# Patient Record
Sex: Female | Born: 1945 | Race: White | Hispanic: No | Marital: Married | Smoking: Former smoker
Health system: Southern US, Community
[De-identification: ages and names within clinical notes are randomized; demographics above are authoritative.]

## PROBLEM LIST (undated history)

## (undated) DIAGNOSIS — N390 Urinary tract infection, site not specified: Secondary | ICD-10-CM

## (undated) DIAGNOSIS — J90 Pleural effusion, not elsewhere classified: Secondary | ICD-10-CM

## (undated) DIAGNOSIS — M109 Gout, unspecified: Secondary | ICD-10-CM

## (undated) DIAGNOSIS — E7800 Pure hypercholesterolemia, unspecified: Secondary | ICD-10-CM

## (undated) DIAGNOSIS — N3010 Interstitial cystitis (chronic) without hematuria: Secondary | ICD-10-CM

## (undated) DIAGNOSIS — I2510 Atherosclerotic heart disease of native coronary artery without angina pectoris: Secondary | ICD-10-CM

## (undated) DIAGNOSIS — M1990 Unspecified osteoarthritis, unspecified site: Secondary | ICD-10-CM

## (undated) DIAGNOSIS — F32A Depression, unspecified: Secondary | ICD-10-CM

## (undated) DIAGNOSIS — R000 Tachycardia, unspecified: Secondary | ICD-10-CM

## (undated) DIAGNOSIS — E785 Hyperlipidemia, unspecified: Secondary | ICD-10-CM

## (undated) DIAGNOSIS — C50919 Malignant neoplasm of unspecified site of unspecified female breast: Secondary | ICD-10-CM

## (undated) DIAGNOSIS — Z9889 Other specified postprocedural states: Secondary | ICD-10-CM

## (undated) DIAGNOSIS — F329 Major depressive disorder, single episode, unspecified: Secondary | ICD-10-CM

## (undated) DIAGNOSIS — Z91030 Bee allergy status: Secondary | ICD-10-CM

## (undated) DIAGNOSIS — G709 Myoneural disorder, unspecified: Secondary | ICD-10-CM

## (undated) DIAGNOSIS — C569 Malignant neoplasm of unspecified ovary: Secondary | ICD-10-CM

## (undated) DIAGNOSIS — Z9289 Personal history of other medical treatment: Secondary | ICD-10-CM

## (undated) DIAGNOSIS — K219 Gastro-esophageal reflux disease without esophagitis: Secondary | ICD-10-CM

## (undated) HISTORY — DX: Personal history of other medical treatment: Z92.89

## (undated) HISTORY — DX: Hyperlipidemia, unspecified: E78.5

## (undated) HISTORY — DX: Bee allergy status: Z91.030

## (undated) HISTORY — DX: Malignant neoplasm of unspecified ovary: C56.9

## (undated) HISTORY — DX: Malignant neoplasm of unspecified site of unspecified female breast: C50.919

## (undated) HISTORY — DX: Urinary tract infection, site not specified: N39.0

## (undated) HISTORY — DX: Tachycardia, unspecified: R00.0

## (undated) HISTORY — PX: CARDIAC CATHETERIZATION: SHX172

## (undated) HISTORY — DX: Atherosclerotic heart disease of native coronary artery without angina pectoris: I25.10

## (undated) HISTORY — DX: Interstitial cystitis (chronic) without hematuria: N30.10

---
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
## 1974-02-15 HISTORY — PX: APPENDECTOMY: SHX54

## 1974-02-15 HISTORY — PX: ABDOMINAL HYSTERECTOMY: SHX81

## 1998-04-07 ENCOUNTER — Other Ambulatory Visit: Admission: RE | Admit: 1998-04-07 | Discharge: 1998-04-07 | Payer: Self-pay | Admitting: *Deleted

## 1999-02-11 ENCOUNTER — Encounter: Payer: Self-pay | Admitting: Urology

## 1999-02-11 ENCOUNTER — Encounter: Admission: RE | Admit: 1999-02-11 | Discharge: 1999-02-11 | Payer: Self-pay | Admitting: Urology

## 1999-04-10 ENCOUNTER — Other Ambulatory Visit: Admission: RE | Admit: 1999-04-10 | Discharge: 1999-04-10 | Payer: Self-pay | Admitting: *Deleted

## 1999-12-10 ENCOUNTER — Ambulatory Visit (HOSPITAL_COMMUNITY): Admission: RE | Admit: 1999-12-10 | Discharge: 1999-12-10 | Payer: Self-pay | Admitting: Gastroenterology

## 2001-05-17 ENCOUNTER — Encounter: Payer: Self-pay | Admitting: Urology

## 2001-05-17 ENCOUNTER — Encounter: Admission: RE | Admit: 2001-05-17 | Discharge: 2001-05-17 | Payer: Self-pay | Admitting: Urology

## 2005-08-19 ENCOUNTER — Emergency Department (HOSPITAL_COMMUNITY): Admission: EM | Admit: 2005-08-19 | Discharge: 2005-08-19 | Payer: Self-pay | Admitting: Emergency Medicine

## 2009-04-15 ENCOUNTER — Ambulatory Visit: Payer: Self-pay | Admitting: Oncology

## 2009-04-15 HISTORY — PX: BREAST LUMPECTOMY: SHX2

## 2009-04-29 ENCOUNTER — Encounter: Admission: RE | Admit: 2009-04-29 | Discharge: 2009-04-29 | Payer: Self-pay | Admitting: General Surgery

## 2009-05-01 ENCOUNTER — Ambulatory Visit (HOSPITAL_BASED_OUTPATIENT_CLINIC_OR_DEPARTMENT_OTHER): Admission: RE | Admit: 2009-05-01 | Discharge: 2009-05-01 | Payer: Self-pay | Admitting: General Surgery

## 2009-05-15 ENCOUNTER — Ambulatory Visit: Payer: Self-pay | Admitting: Oncology

## 2009-05-16 ENCOUNTER — Ambulatory Visit: Admission: RE | Admit: 2009-05-16 | Discharge: 2009-07-02 | Payer: Self-pay | Admitting: Radiation Oncology

## 2009-08-07 ENCOUNTER — Ambulatory Visit: Payer: Self-pay | Admitting: Oncology

## 2009-08-11 LAB — COMPREHENSIVE METABOLIC PANEL
ALT: 18 U/L (ref 0–35)
Alkaline Phosphatase: 73 U/L (ref 39–117)
CO2: 22 mEq/L (ref 19–32)
Creatinine, Ser: 0.73 mg/dL (ref 0.40–1.20)
Sodium: 141 mEq/L (ref 135–145)
Total Bilirubin: 0.5 mg/dL (ref 0.3–1.2)

## 2009-08-11 LAB — CBC WITH DIFFERENTIAL/PLATELET
BASO%: 0.3 % (ref 0.0–2.0)
Basophils Absolute: 0 10*3/uL (ref 0.0–0.1)
EOS%: 1.1 % (ref 0.0–7.0)
Eosinophils Absolute: 0.1 10*3/uL (ref 0.0–0.5)
HCT: 40.1 % (ref 34.8–46.6)
HGB: 13.5 g/dL (ref 11.6–15.9)
LYMPH%: 19.2 % (ref 14.0–49.7)
MCH: 31.3 pg (ref 25.1–34.0)
MCHC: 33.7 g/dL (ref 31.5–36.0)
MCV: 93 fL (ref 79.5–101.0)
MONO#: 0.5 10*3/uL (ref 0.1–0.9)
MONO%: 9.9 % (ref 0.0–14.0)
NEUT#: 3.6 10*3/uL (ref 1.5–6.5)
NEUT%: 69.5 % (ref 38.4–76.8)
Platelets: 270 10*3/uL (ref 145–400)
RBC: 4.31 10*6/uL (ref 3.70–5.45)
RDW: 13.6 % (ref 11.2–14.5)
WBC: 5.2 10*3/uL (ref 3.9–10.3)
lymph#: 1 10*3/uL (ref 0.9–3.3)

## 2009-10-03 ENCOUNTER — Ambulatory Visit: Payer: Self-pay | Admitting: Oncology

## 2009-10-06 LAB — CBC WITH DIFFERENTIAL/PLATELET
BASO%: 0.1 % (ref 0.0–2.0)
Basophils Absolute: 0 10*3/uL (ref 0.0–0.1)
EOS%: 2.2 % (ref 0.0–7.0)
Eosinophils Absolute: 0.2 10*3/uL (ref 0.0–0.5)
HCT: 42.7 % (ref 34.8–46.6)
HGB: 14.4 g/dL (ref 11.6–15.9)
LYMPH%: 12.8 % — ABNORMAL LOW (ref 14.0–49.7)
MCH: 31.6 pg (ref 25.1–34.0)
MCHC: 33.8 g/dL (ref 31.5–36.0)
MCV: 93.4 fL (ref 79.5–101.0)
MONO#: 0.6 10*3/uL (ref 0.1–0.9)
MONO%: 9.3 % (ref 0.0–14.0)
NEUT#: 5.3 10*3/uL (ref 1.5–6.5)
NEUT%: 75.6 % (ref 38.4–76.8)
Platelets: 277 10*3/uL (ref 145–400)
RBC: 4.57 10*6/uL (ref 3.70–5.45)
RDW: 13.4 % (ref 11.2–14.5)
WBC: 7 10*3/uL (ref 3.9–10.3)
lymph#: 0.9 10*3/uL (ref 0.9–3.3)

## 2009-10-06 LAB — COMPREHENSIVE METABOLIC PANEL
ALT: 22 U/L (ref 0–35)
AST: 22 U/L (ref 0–37)
Albumin: 4.4 g/dL (ref 3.5–5.2)
Alkaline Phosphatase: 96 U/L (ref 39–117)
BUN: 15 mg/dL (ref 6–23)
CO2: 26 mEq/L (ref 19–32)
Calcium: 9.5 mg/dL (ref 8.4–10.5)
Chloride: 101 mEq/L (ref 96–112)
Creatinine, Ser: 0.79 mg/dL (ref 0.40–1.20)
Glucose, Bld: 106 mg/dL — ABNORMAL HIGH (ref 70–99)
Potassium: 4.5 mEq/L (ref 3.5–5.3)
Sodium: 139 mEq/L (ref 135–145)
Total Bilirubin: 0.5 mg/dL (ref 0.3–1.2)
Total Protein: 7.5 g/dL (ref 6.0–8.3)

## 2009-10-06 LAB — LIPID PANEL
Cholesterol: 169 mg/dL (ref 0–200)
HDL: 48 mg/dL (ref >39)
LDL Cholesterol: 92 mg/dL (ref 0–99)
Total CHOL/HDL Ratio: 3.5 Ratio
Triglycerides: 146 mg/dL (ref <150)
VLDL: 29 mg/dL (ref 0–40)

## 2009-10-06 LAB — VITAMIN D 25 HYDROXY (VIT D DEFICIENCY, FRACTURES): Vit D, 25-Hydroxy: 42 ng/mL (ref 30–89)

## 2009-12-09 ENCOUNTER — Ambulatory Visit (HOSPITAL_BASED_OUTPATIENT_CLINIC_OR_DEPARTMENT_OTHER): Admission: RE | Admit: 2009-12-09 | Discharge: 2009-12-09 | Payer: Self-pay | Admitting: Orthopedic Surgery

## 2010-01-22 ENCOUNTER — Ambulatory Visit: Payer: Self-pay | Admitting: Oncology

## 2010-01-26 LAB — CBC WITH DIFFERENTIAL/PLATELET
BASO%: 0.4 % (ref 0.0–2.0)
Eosinophils Absolute: 0 10*3/uL (ref 0.0–0.5)
HCT: 37.2 % (ref 34.8–46.6)
LYMPH%: 21.6 % (ref 14.0–49.7)
MCHC: 34 g/dL (ref 31.5–36.0)
MCV: 93.2 fL (ref 79.5–101.0)
MONO#: 0.4 10*3/uL (ref 0.1–0.9)
MONO%: 8.1 % (ref 0.0–14.0)
NEUT%: 69.3 % (ref 38.4–76.8)
Platelets: 246 10*3/uL (ref 145–400)
WBC: 5 10*3/uL (ref 3.9–10.3)

## 2010-01-26 LAB — COMPREHENSIVE METABOLIC PANEL
CO2: 28 mEq/L (ref 19–32)
Creatinine, Ser: 0.79 mg/dL (ref 0.40–1.20)
Glucose, Bld: 107 mg/dL — ABNORMAL HIGH (ref 70–99)
Total Bilirubin: 0.4 mg/dL (ref 0.3–1.2)

## 2010-04-06 NOTE — Op Note (Signed)
  NAME:  [name] [name], HAMMER         ACCOUNT NO.:  [id number]  MEDICAL RECORD NO.:  [id number]          PATIENT TYPE:  AMB  LOCATION:  DSC                          FACILITY:  [facility]  PHYSICIAN:  Cindee Salt, M.D.       DATE OF BIRTH:  [birth year]  DATE OF PROCEDURE:  12/09/2009 DATE OF DISCHARGE:                              OPERATIVE REPORT   PREOPERATIVE DIAGNOSIS:  Stenosing tenosynovitis, right thumb.  POSTOPERATIVE DIAGNOSIS:  Stenosing tenosynovitis, right thumb.  OPERATION:  Release of A1 pulley, right thumb.  SURGEON:  Cindee Salt, MD  ASSISTANT:  Carolyne Fiscal, RN  ANESTHESIA:  Forearm-based IV regional.  ANESTHESIOLOGIST:  Janetta Hora. Gelene Mink, MD  HISTORY:  The patient is a 65 year old female with a history of triggering of her right thumb.  This has not responded to conservative treatment.  She has elected to undergo surgical release.  Pre and postoperative course have been discussed along with risks and complications.  She is aware that there is no guarantee with the surgery, possibility of infection, recurrence, injury to arteries, nerves, and tendons, incomplete relief of symptoms, and dystrophy.  In the preoperative area, the patient is seen.  The extremity marked by both the patient and surgeon.  Antibiotic given.  PROCEDURE:  The patient is brought to the operating room where a forearm- based IV regional anesthetic was carried out without difficulty.  She was prepped using ChloraPrep, supine position, right arm free.  A 3- minute dry time was allowed.  Time-out taken confirming the patient and procedure.  A transverse incision was made over the A1 pulley of the right thumb, carried down through subcutaneous tissue.  She had some feeling, a local infiltration with 0.25% Marcaine without epinephrine was given, approximately 3 mL was used.  The A1 pulley was then identified.  The neurovascular bundle radially and ulnarly identified to the radial aspect of the A1 pulley,  a release was performed.  The oblique pulley was left intact.  Thumb was placed through full range of motion.  No further triggering was noted.  The wound was copiously irrigated with saline.  The skin was closed with interrupted 5-0 Vicryl Rapide sutures.  Sterile compressive dressing was applied.  On deflation of the tourniquet, all fingers immediately pinked.  She was taken to the recovery room for observation in satisfactory condition. She will be discharged home to return to [facility] in 1 week on [place] NX.          ______________________________ Cindee Salt, M.D.     GK/MEDQ  D:  12/09/2009  T:  12/10/2009  Job:  [id number]  Electronically Signed by Cindee Salt M.D. on 04/06/2010 12:16:26 PM

## 2010-04-20 ENCOUNTER — Ambulatory Visit (INDEPENDENT_AMBULATORY_CARE_PROVIDER_SITE_OTHER): Payer: BC Managed Care – PPO | Admitting: Family Medicine

## 2010-04-20 DIAGNOSIS — L258 Unspecified contact dermatitis due to other agents: Secondary | ICD-10-CM

## 2010-04-20 DIAGNOSIS — E7800 Pure hypercholesterolemia, unspecified: Secondary | ICD-10-CM

## 2010-04-29 LAB — POCT HEMOGLOBIN-HEMACUE: Hemoglobin: 14.6 g/dL (ref 12.0–15.0)

## 2010-05-11 LAB — DIFFERENTIAL
Basophils Absolute: 0 10*3/uL (ref 0.0–0.1)
Basophils Relative: 0 % (ref 0–1)
Eosinophils Relative: 1 % (ref 0–5)
Monocytes Absolute: 0.4 10*3/uL (ref 0.1–1.0)
Monocytes Relative: 8 % (ref 3–12)

## 2010-05-11 LAB — CBC
HCT: 41.4 % (ref 36.0–46.0)
Hemoglobin: 13.8 g/dL (ref 12.0–15.0)
Platelets: 261 10*3/uL (ref 150–400)
RBC: 4.35 MIL/uL (ref 3.87–5.11)
WBC: 5.7 10*3/uL (ref 4.0–10.5)

## 2010-05-11 LAB — BASIC METABOLIC PANEL
Chloride: 103 mEq/L (ref 96–112)
GFR calc Af Amer: >60 mL/min (ref >60)
Potassium: 5 mEq/L (ref 3.5–5.1)

## 2010-05-11 LAB — CANCER ANTIGEN 27.29: CA 27.29: 7 U/mL (ref 0–39)

## 2010-08-13 ENCOUNTER — Other Ambulatory Visit: Payer: Self-pay | Admitting: Oncology

## 2010-08-13 ENCOUNTER — Encounter (HOSPITAL_BASED_OUTPATIENT_CLINIC_OR_DEPARTMENT_OTHER): Payer: BC Managed Care – PPO | Admitting: Oncology

## 2010-08-13 DIAGNOSIS — Z170 Estrogen receptor positive status [ER+]: Secondary | ICD-10-CM

## 2010-08-13 DIAGNOSIS — T451X5A Adverse effect of antineoplastic and immunosuppressive drugs, initial encounter: Secondary | ICD-10-CM

## 2010-08-13 DIAGNOSIS — D0590 Unspecified type of carcinoma in situ of unspecified breast: Secondary | ICD-10-CM

## 2010-08-13 DIAGNOSIS — M1990 Unspecified osteoarthritis, unspecified site: Secondary | ICD-10-CM

## 2010-08-13 LAB — CBC WITH DIFFERENTIAL/PLATELET
BASO%: 0.4 % (ref 0.0–2.0)
Basophils Absolute: 0 10*3/uL (ref 0.0–0.1)
EOS%: 0.8 % (ref 0.0–7.0)
MCH: 31.8 pg (ref 25.1–34.0)
MCHC: 33.7 g/dL (ref 31.5–36.0)
MCV: 94.2 fL (ref 79.5–101.0)
MONO%: 6.9 % (ref 0.0–14.0)
NEUT%: 66.9 % (ref 38.4–76.8)
RDW: 13.2 % (ref 11.2–14.5)
lymph#: 1.3 10*3/uL (ref 0.9–3.3)

## 2010-08-13 LAB — COMPREHENSIVE METABOLIC PANEL
ALT: 13 U/L (ref 0–35)
AST: 21 U/L (ref 0–37)
Alkaline Phosphatase: 44 U/L (ref 39–117)
BUN: 19 mg/dL (ref 6–23)
Calcium: 9.3 mg/dL (ref 8.4–10.5)
Chloride: 106 mEq/L (ref 96–112)
Creatinine, Ser: 0.83 mg/dL (ref 0.50–1.10)
Potassium: 4.3 mEq/L (ref 3.5–5.3)

## 2011-01-23 ENCOUNTER — Telehealth: Payer: Self-pay | Admitting: Oncology

## 2011-01-23 NOTE — Telephone Encounter (Signed)
per pof 06/28 called pt and schedule her appts for [id number]

## 2011-03-08 ENCOUNTER — Encounter (INDEPENDENT_AMBULATORY_CARE_PROVIDER_SITE_OTHER): Payer: Self-pay | Admitting: General Surgery

## 2011-03-11 ENCOUNTER — Encounter: Payer: Self-pay | Admitting: Oncology

## 2011-03-11 ENCOUNTER — Ambulatory Visit: Payer: BC Managed Care – PPO | Admitting: Oncology

## 2011-03-11 ENCOUNTER — Other Ambulatory Visit: Payer: BC Managed Care – PPO

## 2011-03-11 ENCOUNTER — Ambulatory Visit (HOSPITAL_BASED_OUTPATIENT_CLINIC_OR_DEPARTMENT_OTHER): Payer: BC Managed Care – PPO | Admitting: Oncology

## 2011-03-11 ENCOUNTER — Other Ambulatory Visit: Payer: BC Managed Care – PPO | Admitting: Lab

## 2011-03-11 DIAGNOSIS — C50919 Malignant neoplasm of unspecified site of unspecified female breast: Secondary | ICD-10-CM

## 2011-03-11 DIAGNOSIS — Z79810 Long term (current) use of selective estrogen receptor modulators (SERMs): Secondary | ICD-10-CM

## 2011-03-11 LAB — COMPREHENSIVE METABOLIC PANEL
ALT: 17 U/L (ref 0–35)
AST: 27 U/L (ref 0–37)
Alkaline Phosphatase: 48 U/L (ref 39–117)
BUN: 15 mg/dL (ref 6–23)
Calcium: 9.2 mg/dL (ref 8.4–10.5)
Creatinine, Ser: 0.88 mg/dL (ref 0.50–1.10)
Total Bilirubin: 0.4 mg/dL (ref 0.3–1.2)

## 2011-03-11 LAB — CBC WITH DIFFERENTIAL/PLATELET
BASO%: 0.3 % (ref 0.0–2.0)
Basophils Absolute: 0 10*3/uL (ref 0.0–0.1)
EOS%: 0.9 % (ref 0.0–7.0)
HCT: 38.2 % (ref 34.8–46.6)
HGB: 12.8 g/dL (ref 11.6–15.9)
LYMPH%: 25.6 % (ref 14.0–49.7)
MCH: 31.1 pg (ref 25.1–34.0)
MCHC: 33.5 g/dL (ref 31.5–36.0)
MCV: 92.9 fL (ref 79.5–101.0)
MONO%: 6.3 % (ref 0.0–14.0)
NEUT%: 66.9 % (ref 38.4–76.8)

## 2011-03-11 MED ORDER — TAMOXIFEN CITRATE 20 MG PO TABS: 20.0000 mg | ORAL_TABLET | Freq: Every day | ORAL | Status: AC

## 2011-03-14 NOTE — Progress Notes (Signed)
OFFICE PROGRESS NOTE  CC  KNAPP,[name], MD, MD [address] [place] [address] Dr. Emelia Loron Dr. 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  PRIOR THERAPY: 1. S/P left breast lumpectomy in March 2011 after she had a screen detected 1.0 cm ER+, PR+, intermediate grade DCIS. Patient had 3 sentinel biopsied all of them were negative for metastatic disease  2. S/P radiation therapy administered between June 05, 2009 - Jul 02, 2009  3. Tamoxifen 20 mg since August 2011.     CURRENT THERAPY:Tamoxifen 20 mg po daily  INTERVAL HISTORY: [name] [name] 66 y.o. female returns for follow up visit today. She was last seen in June 2012. Overall she is doing well. He does continue to experience dizziness intermittently of clear cause. She is able to however function without too much trouble. She remains very active. She denies any fevers, chills, headaches no nausea vomiting, no hot flashes, no weakness or fatigue. Remainder of the 10 point review of systems is negative.  MEDICAL HISTORY: Past Medical History  Diagnosis Date  . Breast cancer     ALLERGIES:  is allergic to codeine and phenothiazines.  MEDICATIONS:  Current Outpatient Prescriptions  Medication Sig Dispense Refill  . aspirin 81 MG tablet Take 81 mg by mouth daily.      . nitrofurantoin (MACRODANTIN) 100 MG capsule Take 100 mg by mouth 4 (four) times daily.      [name] oxybutynin (DITROPAN-XL) 10 MG 24 hr tablet Take 10 mg by mouth daily.      . simvastatin (ZOCOR) 20 MG tablet Take 20 mg by mouth every evening.      . tamoxifen (NOLVADEX) 20 MG tablet Take 20 mg by mouth daily.      . tamoxifen (NOLVADEX) 20 MG tablet Take 1 tablet (20 mg total) by mouth daily.  90 tablet  12    SURGICAL HISTORY:  Past Surgical History  Procedure Date  . Breast lumpectomy     REVIEW OF SYSTEMS:  Pertinent items are noted in HPI.   PHYSICAL  EXAMINATION: General appearance: alert, cooperative, appears stated age and no distress Head: Normocephalic, without obvious abnormality, atraumatic Neck: no adenopathy, no carotid bruit, no JVD, supple, symmetrical, trachea midline and thyroid not enlarged, symmetric, no tenderness/mass/nodules Lymph nodes: Cervical, supraclavicular, and axillary nodes normal. Resp: clear to auscultation bilaterally and normal percussion bilaterally Back: symmetric, no curvature. ROM normal. No CVA tenderness. Cardio: regular rate and rhythm, S1, S2 normal, no murmur, click, rub or gallop and normal apical impulse GI: soft, non-tender; bowel sounds normal; no masses,  no organomegaly Extremities: extremities normal, atraumatic, no cyanosis or edema Neurologic: Alert and oriented X 3, normal strength and tone. Normal symmetric reflexes. Normal coordination and gait Bilateral Breast Exam: no masses or nipple discharge, no skin changes, left breast has a well healaed scar, no nodularity or masses. ECOG PERFORMANCE STATUS: 0 - Asymptomatic  Blood pressure 148/77, pulse 77, temperature 98 F (36.7 C), temperature source Oral, height 5\' 6"  (1.676 m), weight 137 lb 8 oz (62.37 kg).  LABORATORY DATA: Lab Results  Component Value Date   WBC 6.1 03/11/2011   HGB 12.8 03/11/2011   HCT 38.2 03/11/2011   MCV 92.9 03/11/2011   PLT 274 03/11/2011      Chemistry      Component Value Date/Time   NA 139 03/11/2011 1231   K 4.1 03/11/2011 1231   CL 102  03/11/2011 1231   CO2 27 03/11/2011 1231   BUN 15 03/11/2011 1231   CREATININE 0.88 03/11/2011 1231      Component Value Date/Time   CALCIUM 9.2 03/11/2011 1231   ALKPHOS 48 03/11/2011 1231   AST 27 03/11/2011 1231   ALT 17 03/11/2011 1231   BILITOT 0.4 03/11/2011 1231       RADIOGRAPHIC STUDIES:  No results found.  ASSESSMENT: 66 year old female with: 1. DCIS of left breast cancer s/p lumpectomy and sentinel node biopsy 2. S/p radiation therapy 3. Now on adjuvant  tamoxifen tolerating well 4. No evidence of recurrent diease 5. Mammograms up to date   PLAN:   1. Continue tamoxifen 2. Refills prescriptions as needed 3. Follow up in 6 months 4. See Colman Cater in Survivor clinic in 6 months for 60 minutes   All questions were answered. The patient knows to call the clinic with any problems, questions or concerns. We can certainly see the patient much sooner if necessary.  I spent 20 minutes counseling the patient face to face. The total time spent in the appointment was 30 minutes.    Drue Second, MD Medical/Oncology [facility] [phone] (beeper) [phone] (Office)  03/14/2011, 4:18 PM

## 2011-06-02 ENCOUNTER — Encounter: Payer: Self-pay | Admitting: Family Medicine

## 2011-06-02 ENCOUNTER — Ambulatory Visit (INDEPENDENT_AMBULATORY_CARE_PROVIDER_SITE_OTHER): Payer: BC Managed Care – PPO | Admitting: Family Medicine

## 2011-06-02 VITALS — BP 128/78 | HR 72 | Ht 66.0 in | Wt 136.0 lb

## 2011-06-02 DIAGNOSIS — Z131 Encounter for screening for diabetes mellitus: Secondary | ICD-10-CM

## 2011-06-02 DIAGNOSIS — E7800 Pure hypercholesterolemia, unspecified: Secondary | ICD-10-CM

## 2011-06-02 DIAGNOSIS — M79609 Pain in unspecified limb: Secondary | ICD-10-CM

## 2011-06-02 DIAGNOSIS — R079 Chest pain, unspecified: Secondary | ICD-10-CM

## 2011-06-02 DIAGNOSIS — R209 Unspecified disturbances of skin sensation: Secondary | ICD-10-CM

## 2011-06-02 DIAGNOSIS — R200 Anesthesia of skin: Secondary | ICD-10-CM

## 2011-06-02 DIAGNOSIS — M79603 Pain in arm, unspecified: Secondary | ICD-10-CM

## 2011-06-02 NOTE — Progress Notes (Signed)
Chief complaint:  Arm pain and numbness b/l that stops at her elbow, hands numb. Right worse than left. Happens mostly when walking dog uphill and usually more in the afternoon than on the am walk  HPI:  Symptoms for about a month.  H/o ligament tear R thumb (requiring surgery)--has a similar sensation in her arm as to when she had the arm block/tourniquet.  Described as a "pressure/pain".  Starts at shoulder, goes down the elbow, having elbow pain.  Develops numbness in both hands (not in the forearms).  She reports that the numbness is the entire hand, all 5 fingers.  Sometimes also gets a pain across her lower neck/upper back.  Gets this pain walking the dog, gardening, doing anything strenuous.  Denies that dog pulls the leash, or that arm pain is related to holding the dog.  She can no longer walk as fast as usual.  Has similar pain even when walking alone (without dog).  Denies getting any other physical activities (just walking, gardening and gets pain with both).  Sometimes describes a heaviness across her chest, at the same time that her arms are hurting.  Arm and chest pain and hand numbness resolve with rest.  Past Medical History  Diagnosis Date  . Breast cancer   . Interstitial cystitis   . Hyperlipidemia   . Frequent UTI     on prophylaxis   Past Surgical History  Procedure Date  . Breast lumpectomy 04/2009    left  . Partial hysterectomy 1976    vaginal bleeding after 2nd child born  . Appendectomy 1976   Family History  Problem Relation Age of Onset  . Cancer Mother 25    breast cancer  . Heart disease Father 46    MI at 69, CABG in 35's  . Hepatitis Father     C from blood transfusion  . Heart disease Brother     CABG in 54's  . Heart disease Paternal Aunt   . Heart disease Paternal Uncle   . Heart disease Paternal Grandfather   . Diabetes Neg Hx    Current Outpatient Prescriptions on File Prior to Visit  Medication Sig Dispense Refill  . aspirin 81 MG tablet Take  81 mg by mouth daily.      . nitrofurantoin (MACRODANTIN) 100 MG capsule Take 100 mg by mouth daily.       [name] oxybutynin (DITROPAN-XL) 10 MG 24 hr tablet Take 10 mg by mouth daily.      . simvastatin (ZOCOR) 20 MG tablet Take 20 mg by mouth every evening.      . tamoxifen (NOLVADEX) 20 MG tablet Take 20 mg by mouth daily.       Allergies  Allergen Reactions  . Codeine Other (See Comments)    unknown  . Phenothiazines Other (See Comments)    Makes her stop breathing.   ROS:  Denies fevers, URI symptoms, neck or back pain.  Denies headaches, dizziness, shortness of breath, cough, swelling, rash or other problems.  PHYSICAL EXAM: BP 128/78  Pulse 72  Ht 5\' 6"  (1.676 m)  Wt 136 lb (61.689 kg)  BMI 21.95 kg/m2  Well developed, pleasant female in no distress Neck: no lymphadenopathy, thyromegaly or carotid bruit No spinal tenderness, no CVA tenderness Heart: regular rate and rhythm without  Murmur Lungs: clear bilaterally Abdomen: soft, nontender, no organomegaly or mass Extremities: 2+ pulses, no edema. + phalen's test (tingling in 2-4th fingers, but some tingling in thumb and 5th finger  also).  Skin: no rashes Psych: normal mood, affect, hygiene and grooming.  EKG: NSR, rate 70.  No acute changes. RSR in V2  ASSESSMENT/PLAN: 1. Screening for diabetes mellitus  Glucose, random  2. Pure hypercholesterolemia  Lipid panel  3. Arm numbness  PR ELECTROCARDIOGRAM, COMPLETE  4. Chest pain on exertion    5. Arm pain    6. Bilateral hand numbness      Exertional chest and arm pain, and hand numbness in a patient with hyperlipidemia and strong family h/o heart disease.  R/o heart disease.    Has some evidence of CTS based on exam today.  Discussed wrist brace and NSAIDs for possible CTS if cardiac evaluation is normal.  If doesn't improve with these measure, may need further evaluation (ie EMG/NCV vs other)  Has CPE scheduled for June Hyperlipidemia--last lipids 04/2010, past due for  check.  Chem and cbc done by oncologist, normal, except sugars nonfasting. Return this week for fasting lipids and glucose.

## 2011-06-02 NOTE — Progress Notes (Signed)
Addended byJoselyn Arrow on: 06/02/2011 05:45 PM   Modules accepted: Orders

## 2011-06-02 NOTE — Patient Instructions (Signed)
Your symptoms are concerning for atypical symptoms of heart disease.  We will be referring you for a stress test with the cardiologist.  Your symptoms have some features of carpal tunnel syndrome, so if your heart is okay, we can try treating with wrist braces and anti-inflammatories.  We can discuss more at your physical.

## 2011-06-04 ENCOUNTER — Other Ambulatory Visit (HOSPITAL_BASED_OUTPATIENT_CLINIC_OR_DEPARTMENT_OTHER): Payer: BC Managed Care – PPO

## 2011-06-04 DIAGNOSIS — Z131 Encounter for screening for diabetes mellitus: Secondary | ICD-10-CM

## 2011-06-04 DIAGNOSIS — Z79810 Long term (current) use of selective estrogen receptor modulators (SERMs): Secondary | ICD-10-CM

## 2011-06-04 DIAGNOSIS — C50919 Malignant neoplasm of unspecified site of unspecified female breast: Secondary | ICD-10-CM

## 2011-06-04 DIAGNOSIS — E7800 Pure hypercholesterolemia, unspecified: Secondary | ICD-10-CM

## 2011-06-04 LAB — LIPID PANEL
HDL: 42 mg/dL (ref >39)
Total CHOL/HDL Ratio: 4 Ratio
Triglycerides: 100 mg/dL (ref <150)

## 2011-06-04 LAB — GLUCOSE, RANDOM: Glucose, Bld: 99 mg/dL (ref 70–99)

## 2011-06-08 ENCOUNTER — Telehealth: Payer: Self-pay | Admitting: Internal Medicine

## 2011-06-08 ENCOUNTER — Encounter: Payer: Self-pay | Admitting: Internal Medicine

## 2011-06-08 ENCOUNTER — Other Ambulatory Visit: Payer: Self-pay | Admitting: Family Medicine

## 2011-06-08 DIAGNOSIS — Z91030 Bee allergy status: Secondary | ICD-10-CM

## 2011-06-08 MED ORDER — EPINEPHRINE 0.3 MG/0.3ML IJ DEVI: 0.3000 mg | Freq: Once | INTRAMUSCULAR | Status: DC

## 2011-06-08 NOTE — Telephone Encounter (Signed)
Please clarify what diagnosis this is for, so it can be added to her chart (no reason/diagnosis for this in chart).  If an appropriate diagnosis, then we can refill and add to pt's history in chart

## 2011-06-08 NOTE — Telephone Encounter (Signed)
Pt uses epipen for allergic to yellow jackets

## 2011-06-08 NOTE — Telephone Encounter (Signed)
And sent to pharmacy

## 2011-06-08 NOTE — Telephone Encounter (Signed)
Added epipen in computer

## 2011-06-16 ENCOUNTER — Ambulatory Visit: Payer: BC Managed Care – PPO | Admitting: Family Medicine

## 2011-06-21 ENCOUNTER — Ambulatory Visit (INDEPENDENT_AMBULATORY_CARE_PROVIDER_SITE_OTHER): Payer: BC Managed Care – PPO | Admitting: Cardiovascular Disease

## 2011-06-21 ENCOUNTER — Encounter: Payer: Self-pay | Admitting: Cardiovascular Disease

## 2011-06-21 VITALS — BP 140/86 | HR 74 | Ht 66.0 in | Wt 139.4 lb

## 2011-06-21 DIAGNOSIS — I200 Unstable angina: Secondary | ICD-10-CM | POA: Insufficient documentation

## 2011-06-21 MED ORDER — NITROGLYCERIN 0.4 MG SL SUBL: 0.4000 mg | SUBLINGUAL_TABLET | SUBLINGUAL | Status: DC | PRN

## 2011-06-21 NOTE — Patient Instructions (Signed)
Your physician has requested that you have en exercise stress myoview. Please follow instruction sheet, as given.  Your physician recommends that you schedule a follow-up appointment in: 1 month  Your physician has recommended you make the following change in your medication:   Use nitroglycerine 0.4 mg, with chest pain,  One tablet under tongue up to three times if pain continues after 3 doses call 911

## 2011-06-21 NOTE — Assessment & Plan Note (Addendum)
[name] [name] presents with symptoms that are quite concerning. She has symptoms of bilateral arm pain associated with intense chest pressure. This chest pressure causes shortness breath and a cough. It also radiates up toward neck. She has a strong family history of cardiac disease. She has a history of hyperlipidemia but has been on simvastatin.  [name] been quite active all of her life. 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 Just as of a month or so ago she's milligrams to do these activities because of this intense chest pressure. It is clear that she's developed a new issue.  Given the severity of the symptoms and the sudden onset I think that it is imperative that we do a stress Myoview study for further evaluation. We've called her in a prescription for nitroglycerin. I'll see her back in one month for followup visit. She'll call me sooner if she has any other problems.

## 2011-06-21 NOTE — Progress Notes (Addendum)
[name] [name] Date of Birth  1945-06-07       [facility] Office [address] [place], [place]  [address] [phone]   Fax  [phone]   [facility] [address] [place], [place]  [address] [phone]  Fax [phone]  Problem List: 1. Chest pain 2. Breast cancer - on Tamoxifin 3. Hyperlipidemia 4. Interstitial cystitis   History of Present Illness:  [name] [name] is a 66 yo with exercise induced bilateral arm pain.  This is associated with chest pressure and coughing.  It is also associated with posterior neck pain.  This occurs primarily with walking or when she carries a load in the wheelbarrow.  She is quite active at her job. 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 [name] able to work for hours at a time and has never had any problems.  She does however have episodes of bilateral arm pain with this associated chest pressure when she walks uphill.  Current Outpatient Prescriptions on File Prior to Visit  Medication Sig Dispense Refill  . aspirin 81 MG tablet Take 81 mg by mouth daily.      [name] EPINEPHrine (EPIPEN) 0.3 mg/0.3 mL DEVI Inject 0.3 mLs (0.3 mg total) into the muscle once.  1 Device  0  . nitrofurantoin (MACRODANTIN) 100 MG capsule Take 100 mg by mouth daily.       [name] oxybutynin (DITROPAN-XL) 10 MG 24 hr tablet Take 10 mg by mouth daily.      . simvastatin (ZOCOR) 20 MG tablet TAKE ONE TABLET AT BEDTIME  30 tablet  5  . tamoxifen (NOLVADEX) 20 MG tablet Take 20 mg by mouth daily.        Allergies  Allergen Reactions  . Codeine Other (See Comments)    unknown  . Phenothiazines Other (See Comments)    Makes her stop breathing.  [name] [name] Jacket Venom     Past Medical History  Diagnosis Date  . Breast cancer   . Interstitial cystitis   . Hyperlipidemia   . Frequent UTI     on prophylaxis  . 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 Breast lumpectomy 04/2009    left  . Partial hysterectomy 1976    vaginal bleeding after 2nd child born  . Appendectomy 1976    History  Smoking status  . Former Smoker  . Quit date: 02/15/2001  Smokeless tobacco  . Never Used    History  Alcohol Use  . Yes    1 glass of wine per day.    Family History  Problem Relation Age of Onset  . Cancer Mother 66    breast cancer  . Heart disease Father 44    MI at 48, CABG in 64's  . Hepatitis Father     C from blood transfusion  . Heart disease Brother     CABG in 76's  . Heart disease Paternal Aunt   . Heart disease Paternal Uncle   . Heart disease Paternal Grandfather   . Diabetes Neg Hx     Reviw of Systems:  Reviewed in the HPI.  All other systems are negative.  Physical Exam: Blood pressure 140/86, pulse 74, height 5\' 6"  (1.676 m), weight 139 lb 6.4 oz (63.231 kg). General: Well developed, well nourished, in no acute  distress.  Head: Normocephalic, atraumatic, sclera non-icteric, mucus membranes are moist,   Neck: Supple. Carotids are 2 + without bruits. No JVD  Lungs: Clear bilaterally to auscultation.  Heart: regular rate.  normal  S1 S2. No murmurs, gallops or rubs.  Abdomen: Soft, non-tender, non-distended with normal bowel sounds. No hepatomegaly. No rebound/guarding. No masses.  Msk:  Strength and tone are normal  Extremities: No clubbing or cyanosis. No edema.  Distal pedal pulses are 2+ and equal bilaterally.  Neuro: Alert and oriented X 3. Moves all extremities spontaneously.  Psych:  Responds to questions appropriately with a normal affect.  ECG: Jun 21, 2011-normal sinus rhythm at 74 beats a minute. EKG is normal  Assessment / Plan:

## 2011-06-29 ENCOUNTER — Ambulatory Visit (HOSPITAL_COMMUNITY): Payer: BC Managed Care – PPO | Attending: Cardiology | Admitting: Radiology

## 2011-06-29 VITALS — BP 154/84 | HR 70 | Ht 66.0 in | Wt 137.0 lb

## 2011-06-29 DIAGNOSIS — R0789 Other chest pain: Secondary | ICD-10-CM | POA: Insufficient documentation

## 2011-06-29 DIAGNOSIS — R0602 Shortness of breath: Secondary | ICD-10-CM

## 2011-06-29 DIAGNOSIS — R42 Dizziness and giddiness: Secondary | ICD-10-CM | POA: Insufficient documentation

## 2011-06-29 DIAGNOSIS — L98499 Non-pressure chronic ulcer of skin of other sites with unspecified severity: Secondary | ICD-10-CM | POA: Insufficient documentation

## 2011-06-29 DIAGNOSIS — Z8249 Family history of ischemic heart disease and other diseases of the circulatory system: Secondary | ICD-10-CM | POA: Insufficient documentation

## 2011-06-29 DIAGNOSIS — R0989 Other specified symptoms and signs involving the circulatory and respiratory systems: Secondary | ICD-10-CM | POA: Insufficient documentation

## 2011-06-29 DIAGNOSIS — R61 Generalized hyperhidrosis: Secondary | ICD-10-CM | POA: Insufficient documentation

## 2011-06-29 DIAGNOSIS — E785 Hyperlipidemia, unspecified: Secondary | ICD-10-CM | POA: Insufficient documentation

## 2011-06-29 DIAGNOSIS — R079 Chest pain, unspecified: Secondary | ICD-10-CM

## 2011-06-29 DIAGNOSIS — Z87891 Personal history of nicotine dependence: Secondary | ICD-10-CM | POA: Insufficient documentation

## 2011-06-29 DIAGNOSIS — I200 Unstable angina: Secondary | ICD-10-CM | POA: Insufficient documentation

## 2011-06-29 DIAGNOSIS — M79609 Pain in unspecified limb: Secondary | ICD-10-CM | POA: Insufficient documentation

## 2011-06-29 DIAGNOSIS — R0609 Other forms of dyspnea: Secondary | ICD-10-CM | POA: Insufficient documentation

## 2011-06-29 MED ORDER — TECHNETIUM TC 99M TETROFOSMIN IV KIT
33.0000 | PACK | Freq: Once | INTRAVENOUS | Status: AC | PRN
  Administered 2011-06-29: 33 via INTRAVENOUS

## 2011-06-29 MED ORDER — TECHNETIUM TC 99M TETROFOSMIN IV KIT
11.0000 | PACK | Freq: Once | INTRAVENOUS | Status: AC | PRN
  Administered 2011-06-29: 11 via INTRAVENOUS

## 2011-06-29 NOTE — Progress Notes (Signed)
[facility] SITE 3 NUCLEAR MED [address] [place] [address] [phone]  Cardiology Nuclear Med Study  [name] [name] is a 66 y.o. female     MRN : [id number]     DOB: 04-04-1945  Procedure Date: 06/29/2011  Nuclear Med Background Indication for Stress Test:  Evaluation for Ischemia History:  No previous documented CAD. Cardiac Risk Factors: Family History - CAD, History of Smoking and Lipids  Symptoms:  Chest Pressure>(B) Arms with and without Exertion (last episode of chest discomfort was this morning while walking the dog), Diaphoresis, DOE, Fatigue and Light-Headedness   Nuclear Pre-Procedure Caffeine/Decaff Intake:  None NPO After: 12:00am   Lungs:  clear O2 Sat: 98% on room air. IV 0.9% NS with Angio Cath:  22g  IV Site: R Hand  IV Started by:  Cathlyn Parsons, RN  Chest Size (in):  34 Cup Size: B  Height: 5\' 6"  (1.676 m)  Weight:  137 lb (62.143 kg)  BMI:  Body mass index is 22.11 kg/(m^2). Tech Comments:  n/a    Nuclear Med Study 1 or 2 day study: 1 day  Stress Test Type:  Stress  Reading MD: Marca Ancona, MD  Order Authorizing Provider:  Kristeen Miss, MD  Resting Radionuclide: Technetium 59m Tetrofosmin  Resting Radionuclide Dose: 11.0 mCi   Stress Radionuclide:  Technetium 34m Tetrofosmin  Stress Radionuclide Dose: 33.0 mCi           Stress Protocol Rest HR: 70 Stress HR: 146  Rest BP: 154/84 Stress BP: 218/112  Exercise Time (min): 4:31 METS: 6.4   Predicted Max HR: 156 bpm % Max HR: 93.59 bpm Rate Pressure Product: 21308   Dose of Adenosine (mg):  n/a Dose of Lexiscan: n/a mg  Dose of Atropine (mg): n/a Dose of Dobutamine: n/a mcg/kg/min (at max HR)  Stress Test Technologist: Smiley Houseman, CMA-N  Nuclear Technologist:  Domenic Polite, CNMT     Rest Procedure:  Myocardial perfusion imaging was performed at rest 45 minutes following the intravenous administration of Technetium 45m Tetrofosmin.  Rest ECG: No acute  changes  Stress Procedure:  The patient exercised on the treadmill utilizing the Bruce protocol for 4:31 minutes. She then stopped due to fatigue.  She c/o (R) elbow pain and chest pressure, 2-3/10.  There were no diagnostic ST-T wave changes.  She did have a hypertensive response to exercise, 218/112.  Technetium 64m Tetrofosmin was injected at peak exercise and myocardial perfusion imaging was performed after a brief delay.  Stress ECG: No significant change from baseline ECG  QPS Raw Data Images:  There is prominent gut uptake with stress.  Stress Images:  Medium, mild basal to mid inferior perfusion defect.  Rest Images:  Normal homogeneous uptake in all areas of the myocardium. Subtraction (SDS):  Reversible, medium, mild basal to mid inferior perfusion defect.  Transient Ischemic Dilatation (Normal <1.22): 1.11 Lung/Heart Ratio (Normal <0.45):  0.27  Quantitative Gated Spect Images QGS EDV:  65 ml QGS ESV:  19 ml  Impression Exercise Capacity:  Below average.  BP Response:  Hypertensive blood pressure response. Clinical Symptoms:  Chest pain ECG Impression:  Insignificant upsloping ST segment depression. Comparison with Prior Nuclear Study: No previous nuclear study performed  Overall Impression:  Abnormal stress nuclear study. There was a medium, mild reversible basal to mid inferior perfusion defect. There was more gut uptake with stress than rest so this may represent attenuation from uptake below the diaphragm.  However, cannot completely rule  out ischemia.  This may be a good case for a coronary CT angiogram.   LV Ejection Fraction: 71%.  LV Wall Motion:  NL LV Function; NL Wall Motion  [employer]

## 2011-07-06 ENCOUNTER — Encounter: Payer: Self-pay | Admitting: Cardiovascular Disease

## 2011-07-06 ENCOUNTER — Ambulatory Visit (INDEPENDENT_AMBULATORY_CARE_PROVIDER_SITE_OTHER): Payer: BC Managed Care – PPO | Admitting: Cardiovascular Disease

## 2011-07-06 VITALS — BP 133/82 | HR 83 | Ht 66.0 in | Wt 136.0 lb

## 2011-07-06 DIAGNOSIS — R5383 Other fatigue: Secondary | ICD-10-CM | POA: Insufficient documentation

## 2011-07-06 DIAGNOSIS — R5381 Other malaise: Secondary | ICD-10-CM

## 2011-07-06 DIAGNOSIS — I2510 Atherosclerotic heart disease of native coronary artery without angina pectoris: Secondary | ICD-10-CM

## 2011-07-06 MED ORDER — ATORVASTATIN CALCIUM 20 MG PO TABS: 20.0000 mg | ORAL_TABLET | Freq: Every day | ORAL | Status: DC

## 2011-07-06 MED ORDER — METOPROLOL SUCCINATE ER 25 MG PO TB24: 25.0000 mg | ORAL_TABLET | Freq: Every day | ORAL | Status: DC

## 2011-07-06 NOTE — Assessment & Plan Note (Signed)
[name] [name] continues to have problems with chest pain and shortness breath particularly with exertion. Her stress Myoview study revealed an inferior wall defect he we will refer her for a coronary CT angiogram. Her heart rate is in the 80.  We'll start her on Toprol-XL 25 mg a day.    We will discontinue her simvastatin. We'll start her on atorvastatin 20 mg a day.  She will keep her apt. In June.

## 2011-07-06 NOTE — Progress Notes (Signed)
[name] [name] Date of Birth  1945/03/09       [facility] Office [address], [address] [place], [place]  [address] [phone]   Fax  [phone]   [facility] [address], [address] [place], [place]  [address] [phone]  Fax [phone]  Problem List: 1. Chest pain 2. Breast cancer - on Tamoxifin 3. Hyperlipidemia 4. Interstitial cystitis   History of Present Illness:  [name] [name] is a 66 yo with exercise induced bilateral arm pain.  This is associated with chest pressure and coughing.  It is also associated with posterior neck pain.  This occurs primarily with walking or when she carries a load in the wheelbarrow.  She is quite active at her job. 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 [name] able to work for hours at a time and has never had any problems.  She does however have episodes of bilateral arm pain with this associated chest pressure when she walks uphill.  She recently had a myoview study that identified an abnormality in the inferior wall.  She has continued to have severe dyspnea with any exertion.    Current Outpatient Prescriptions on File Prior to Visit  Medication Sig Dispense Refill  . aspirin 81 MG tablet Take 81 mg by mouth daily.      [name] EPINEPHrine (EPIPEN) 0.3 mg/0.3 mL DEVI Inject 0.3 mLs (0.3 mg total) into the muscle once.  1 Device  0  . nitrofurantoin (MACRODANTIN) 100 MG capsule Take 100 mg by mouth daily.       . nitroGLYCERIN (NITROSTAT) 0.4 MG SL tablet Place 1 tablet (0.4 mg total) under the tongue every 5 (five) minutes as needed for chest pain.  25 tablet  3  . oxybutynin (DITROPAN-XL) 10 MG 24 hr tablet Take 10 mg by mouth daily.      . tamoxifen (NOLVADEX) 20 MG tablet Take 20 mg by mouth daily.      [name] atorvastatin (LIPITOR) 20 MG tablet Take 1 tablet (20 mg total) by mouth daily.  90 tablet  3  . metoprolol  succinate (TOPROL XL) 25 MG 24 hr tablet Take 1 tablet (25 mg total) by mouth daily.  90 tablet  1    Allergies  Allergen Reactions  . Codeine Other (See Comments)    unknown  . Phenothiazines Other (See Comments)    Makes her stop breathing.  [name] [name] Jacket Venom     Past Medical History  Diagnosis Date  . Breast cancer   . Interstitial cystitis   . Hyperlipidemia   . Frequent UTI     on prophylaxis  . 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 Breast lumpectomy 04/2009    left  . Partial hysterectomy 1976    vaginal bleeding after 2nd child born  . Appendectomy 1976    History  Smoking status  . Former Smoker  . Quit date: 02/15/2001  Smokeless tobacco  . Never Used    History  Alcohol Use  . Yes    1 glass of wine per day.    Family History  Problem Relation Age of Onset  . Cancer Mother 29    breast cancer  . Heart disease Father 51    MI at 52, CABG in 6's  . Hepatitis Father  C from blood transfusion  . Heart disease Brother     CABG in 70's  . Heart disease Paternal Aunt   . Heart disease Paternal Uncle   . Heart disease Paternal Grandfather   . Diabetes Neg Hx     Reviw of Systems:  Reviewed in the HPI.  All other systems are negative.  Physical Exam: Blood pressure 133/82, pulse 83, height 5\' 6"  (1.676 m), weight 136 lb (61.689 kg). General: Well developed, well nourished, in no acute distress.  Head: Normocephalic, atraumatic, sclera non-icteric, mucus membranes are moist,   Neck: Supple. Carotids are 2 + without bruits. No JVD  Lungs: Clear bilaterally to auscultation.  Heart: regular rate.  normal  S1 S2. No murmurs, gallops or rubs.  Abdomen: Soft, non-tender, non-distended with normal bowel sounds. No hepatomegaly. No rebound/guarding. No masses.  Msk:  Strength and tone are normal  Extremities: No clubbing or cyanosis. No edema.  Distal pedal pulses are 2+ and equal bilaterally.  Neuro:  Alert and oriented X 3. Moves all extremities spontaneously.  Psych:  Responds to questions appropriately with a normal affect.  ECG:  Assessment / Plan:

## 2011-07-06 NOTE — Assessment & Plan Note (Signed)
We will get a TSH.

## 2011-07-06 NOTE — Patient Instructions (Signed)
Your physician recommends that you return for lab work in: today tsh, bmet   Your physician has requested that you have an echocardiogram. Echocardiography is a painless test that uses sound waves to create images of your heart. It provides your doctor with information about the size and shape of your heart and how well your heart's chambers and valves are working. This procedure takes approximately one hour. There are no restrictions for this procedure.  Non-Cardiac CT Angiography (CTA), is a special type of CT scan that uses a computer to produce multi-dimensional views of major blood vessels throughout the body. In CT angiography, a contrast material is injected through an IV to help visualize the blood vessels  Your physician has recommended you make the following change in your medication:   STOP SIMVASTATIN START ATORVASTATIN/ LIPITOR 20 MG A DAY, REPEAT LABS FASTIN IN THREE MONTHS  START TOPROL XL 25 MG DAILY.

## 2011-07-07 LAB — BASIC METABOLIC PANEL
BUN: 16 mg/dL (ref 6–23)
CO2: 27 mEq/L (ref 19–32)
Chloride: 106 mEq/L (ref 96–112)
Creatinine, Ser: 1 mg/dL (ref 0.4–1.2)

## 2011-07-08 ENCOUNTER — Encounter: Payer: Self-pay | Admitting: *Deleted

## 2011-07-14 ENCOUNTER — Ambulatory Visit (HOSPITAL_COMMUNITY): Payer: BC Managed Care – PPO | Attending: Cardiology

## 2011-07-14 ENCOUNTER — Other Ambulatory Visit: Payer: Self-pay

## 2011-07-14 DIAGNOSIS — I519 Heart disease, unspecified: Secondary | ICD-10-CM | POA: Insufficient documentation

## 2011-07-14 DIAGNOSIS — R5383 Other fatigue: Secondary | ICD-10-CM | POA: Insufficient documentation

## 2011-07-14 DIAGNOSIS — I2510 Atherosclerotic heart disease of native coronary artery without angina pectoris: Secondary | ICD-10-CM

## 2011-07-14 DIAGNOSIS — Z853 Personal history of malignant neoplasm of breast: Secondary | ICD-10-CM | POA: Insufficient documentation

## 2011-07-14 DIAGNOSIS — R072 Precordial pain: Secondary | ICD-10-CM | POA: Insufficient documentation

## 2011-07-14 DIAGNOSIS — R5381 Other malaise: Secondary | ICD-10-CM | POA: Insufficient documentation

## 2011-07-14 DIAGNOSIS — Z87891 Personal history of nicotine dependence: Secondary | ICD-10-CM | POA: Insufficient documentation

## 2011-07-14 DIAGNOSIS — E7800 Pure hypercholesterolemia, unspecified: Secondary | ICD-10-CM | POA: Insufficient documentation

## 2011-07-21 ENCOUNTER — Encounter: Payer: Self-pay | Admitting: Internal Medicine

## 2011-07-22 ENCOUNTER — Telehealth: Payer: Self-pay | Admitting: Internal Medicine

## 2011-07-22 NOTE — Telephone Encounter (Signed)
Pt asked for her to leave message on house phone cause she was at work. Husband answered and i told him to tell her she does not need labs done when she comes in.

## 2011-07-22 NOTE — Telephone Encounter (Signed)
Advise no, labs shouldn't be needed then.

## 2011-07-29 ENCOUNTER — Ambulatory Visit (INDEPENDENT_AMBULATORY_CARE_PROVIDER_SITE_OTHER): Payer: BC Managed Care – PPO | Admitting: Family Medicine

## 2011-07-29 ENCOUNTER — Encounter: Payer: Self-pay | Admitting: Family Medicine

## 2011-07-29 VITALS — BP 140/80 | HR 72 | Ht 66.0 in | Wt 136.0 lb

## 2011-07-29 DIAGNOSIS — E7800 Pure hypercholesterolemia, unspecified: Secondary | ICD-10-CM

## 2011-07-29 DIAGNOSIS — R0600 Dyspnea, unspecified: Secondary | ICD-10-CM

## 2011-07-29 DIAGNOSIS — R0609 Other forms of dyspnea: Secondary | ICD-10-CM

## 2011-07-29 DIAGNOSIS — Z0000 Encounter for general adult medical examination without abnormal findings: Secondary | ICD-10-CM

## 2011-07-29 DIAGNOSIS — Z23 Encounter for immunization: Secondary | ICD-10-CM

## 2011-07-29 DIAGNOSIS — Z2911 Encounter for prophylactic immunotherapy for respiratory syncytial virus (RSV): Secondary | ICD-10-CM

## 2011-07-29 DIAGNOSIS — R229 Localized swelling, mass and lump, unspecified: Secondary | ICD-10-CM

## 2011-07-29 LAB — POCT URINALYSIS DIPSTICK
Bilirubin, UA: NEGATIVE
Glucose, UA: NEGATIVE
Ketones, UA: NEGATIVE

## 2011-07-29 NOTE — Patient Instructions (Addendum)
HEALTH MAINTENANCE RECOMMENDATIONS:  It is recommended that you get at least 30 minutes of aerobic exercise at least 5 days/week (for weight loss, you may need as much as 60-90 minutes). This can be any activity that gets your heart rate up. This can be divided in 10-15 minute intervals if needed, but try and build up your endurance at least once a week.  Weight bearing exercise is also recommended twice weekly.  Eat a healthy diet with lots of vegetables, fruits and fiber.  "Colorful" foods have a lot of vitamins (ie green vegetables, tomatoes, red peppers, etc).  Limit sweet tea, regular sodas and alcoholic beverages, all of which has a lot of calories and sugar.  Up to 1 alcoholic drink daily may be beneficial for women (unless trying to lose weight, watch sugars).  Drink a lot of water.  Calcium recommendations are 1200-1500 mg daily (1500 mg for postmenopausal women or women without ovaries), and vitamin D 1000 IU daily.  This should be obtained from diet and/or supplements (vitamins), and calcium should not be taken all at once, but in divided doses.  Monthly self breast exams and yearly mammograms for women over the age of 31 is recommended.  Sunscreen of at least SPF 30 should be used on all sun-exposed parts of the skin when outside between the hours of 10 am and 4 pm (not just when at beach or pool, but even with exercise, golf, tennis, and yard work!)  Use a sunscreen that says "broad spectrum" so it covers both UVA and UVB rays, and make sure to reapply every 1-2 hours.  Remember to change the batteries in your smoke detectors when changing your clock times in the spring and fall.  Use your seat belt every time you are in a car, and please drive safely and not be distracted with cell phones and texting while driving.  Return for nurse visit in the fall for pneumovax (really any time after your birthday, to be given at age 65).  Remember to call Dr. Randa Evens to schedule routine screening  colonoscopy (once things settle down and you have time).  Please call and check with [place] regarding your bone density test, if you haven't received a call with the appointment.

## 2011-07-29 NOTE — Progress Notes (Signed)
[name] [name] is a 66 y.o. female who presents for a complete physical.  She has the following concerns:  Has developed nodules under the skin, surrounding her umbilicus.  First noticed a few months ago.  Started out with just one, but has developed several.  Haven't changed in size. Denies pain, inflammation or soreness.  Denies any bug bites, trauma, or other preceding incident.  She is under the cardiologist's care, being evaluated for exertional chest pain, angina. Had echo, and she reports that she cut back on salt intake, as recommended. She has also had a stress test, and is scheduled for coronary CT angiogram.  She reports that the symptoms she had during the stress test (elbow pain and chest pain) was very mild in comparison to with other activities.  She was changed from simvastatin to Lipitor by cardiologist.  Due again for lipids in August, being monitored by Dr. Elease Hashimoto.  Has been on oxybutynin and nitrofurantoin for many years with excellent results in treating IC, under the care of Dr. Patsi Sears.  Has cough as well as dyspnea with exertion, as well as developing the chest pain, pain in both arms and upper back. Symptoms all relieved by rest.  Health Maintenance: Immunization History  Administered Date(s) Administered  . Pneumococcal Polysaccharide 03/18/1998  . Td 08/05/2004  gets flu shots yearly at school/work Had shingles in the past, never got vaccine. Last Pap smear: 2009; s/p hysterectomy Last mammogram: 02/2011 Last colonoscopy: 2001, Dr. Randa Evens Last DEXA: 09/1998 (normal per pt) Dentist: once yearly Ophtho: once yearly Exercise: limited due to SOB/chest pain.  Loves to garden.  Past Medical History  Diagnosis Date  . Breast cancer   . Interstitial cystitis   . Hyperlipidemia   . Frequent UTI     on prophylaxis  . 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 Breast lumpectomy 04/2009    left  . Partial hysterectomy 1976   vaginal bleeding after 2nd child born  . Appendectomy 1976    History   Social History  . Marital Status: Married    Spouse Name: N/A    Number of Children: 1  . Years of Education: N/A   Occupational History  . media Geophysicist/field seismologist and ESL coordinator [employer]   Social History Main Topics  . Smoking status: Former Smoker    Quit date: 02/15/2001  . Smokeless tobacco: Never Used  . Alcohol Use: Yes     1 glass of wine per day.  . Drug Use: No  . Sexually Active: Yes   Other Topics Concern  . Not on file   Social History Narrative   Lives with husband and dog    Family History  Problem Relation Age of Onset  . Cancer Mother 85    breast cancer  . Heart disease Father 78    MI at 68, CABG in 88's  . Hepatitis Father     C from blood transfusion  . Heart disease Brother     CABG in 17's  . Heart disease Paternal Aunt   . Heart disease Paternal Uncle   . Heart disease Paternal Grandfather   . Diabetes Neg Hx     Current outpatient prescriptions:aspirin 81 MG tablet, Take 81 mg by mouth daily., Disp: , Rfl: ;  atorvastatin (LIPITOR) 20 MG tablet, Take 1 tablet (20 mg total) by mouth daily., Disp: 90 tablet, Rfl: 3;  EPINEPHrine (EPIPEN) 0.3 mg/0.3 mL DEVI, Inject  0.3 mLs (0.3 mg total) into the muscle once., Disp: 1 Device, Rfl: 0;  metoprolol succinate (TOPROL XL) 25 MG 24 hr tablet, Take 1 tablet (25 mg total) by mouth daily., Disp: 90 tablet, Rfl: 1 nitrofurantoin (MACRODANTIN) 100 MG capsule, Take 100 mg by mouth daily. , Disp: , Rfl: ;  nitroGLYCERIN (NITROSTAT) 0.4 MG SL tablet, Place 1 tablet (0.4 mg total) under the tongue every 5 (five) minutes as needed for chest pain., Disp: 25 tablet, Rfl: 3;  oxybutynin (DITROPAN-XL) 10 MG 24 hr tablet, Take 10 mg by mouth daily., Disp: , Rfl: ;  tamoxifen (NOLVADEX) 20 MG tablet, Take 20 mg by mouth daily., Disp: , Rfl:   Allergies  Allergen Reactions  . Codeine Other (See Comments)    unknown  . Phenothiazines  Other (See Comments)    Makes her stop breathing.  . Yellow Jacket Venom     ROS:  The patient denies anorexia, fever, weight changes, headaches,  vision changes, decreased hearing, ear pain, sore throat, breast concerns, palpitations, syncope, swelling, nausea, vomiting, diarrhea, constipation, abdominal pain, melena, hematochezia, indigestion/heartburn, hematuria, incontinence, dysuria, vaginal bleeding, discharge, odor or itch, genital lesions, joint pains, numbness, tingling, weakness, tremor, suspicious skin lesions, depression, anxiety, abnormal bleeding/bruising, or enlarged lymph nodes. Numbness in arms when has arm and chest pain with exertion.  Some dizziness related to tamoxifen  PHYSICAL EXAM: BP 140/80  Pulse 72  Ht 5\' 6"  (1.676 m)  Wt 136 lb (61.689 kg)  BMI 21.95 kg/m2  General Appearance:    Alert, cooperative, no distress, appears stated age  Head:    Normocephalic, without obvious abnormality, atraumatic  Eyes:    PERRL, conjunctiva/corneas clear, EOM's intact, fundi    benign  Ears:    Normal TM's and external ear canals  Nose:   Nares normal, mucosa normal, no drainage or sinus   tenderness  Throat:   Lips, mucosa, and tongue normal; teeth and gums normal. Torus pallatini upper palate  Neck:   Supple, no lymphadenopathy;  thyroid:  no   enlargement/tenderness/nodules; no carotid   bruit or JVD. Firm (calcified per pt) L SCM muscle, nontender.  Back:    Spine nontender, no curvature, ROM normal, no CVA     tenderness  Lungs:     Clear to auscultation bilaterally without wheezes, rales or     ronchi; respirations unlabored  Chest Wall:    No tenderness or deformity   Heart:    Regular rate and rhythm, S1 and S2 normal, no murmur, rub   or gallop  Breast Exam:    No tenderness, masses, or nipple discharge or inversion.      No axillary lymphadenopathy. WHSS on left.  Abdomen:     Soft, non-tender, nondistended, normoactive bowel sounds,    no masses, no  hepatosplenomegaly. Subcutaneous nodules at 2, 3, 5, 8 o'clock surrounding umbilicus (2 and 5 are the largest, others are <93mm).  nontender.  Genitalia:    Normal external genitalia without lesions.  Atrophic changes. BUS and vagina normal; uterus surgically absent.  No adnexal tenderness or mass. No abnormal vaginal discharge.   Rectal:    Normal tone, no masses or tenderness; guaiac negative stool  Extremities:   No clubbing, cyanosis or edema  Pulses:   2+ and symmetric all extremities  Skin:   Skin color, texture, turgor normal, no rashes or lesions, except periumbically (see above)  Lymph nodes:   Cervical, supraclavicular, and axillary nodes normal  Neurologic:   CNII-XII  intact, normal strength, sensation and gait; reflexes 2+ and symmetric throughout          Psych:   Normal mood, affect, hygiene and grooming.     ASSESSMENT/PLAN: 1. Routine general medical examination at a health care facility  POCT Urinalysis Dipstick, Visual acuity screening  2. Need for Tdap vaccination  Tdap vaccine greater than or equal to 7yo IM  3. Need for shingles vaccine  Varicella-zoster vaccine subcutaneous  4. Dyspnea  DG Chest 2 View   most likely cardiac, and is undergoing workup.  Given longterm nitrofurantoin use and dyspnea, will also check CXR  5. Subcutaneous nodule     surrounding umbilicus.  likely benign--will monitor for change, and refer to surgeon for excision if rapid growth, painful, or other concerns.  6. Pure hypercholesterolemia     recently changed from simvastatin to atorvastatin.  Due to be checked again in August by cardiology   Dyspnea and chest pain--CT angiogram is scheduled.  I recommend checking CXR.  If cardiac evaluation is negative, then consider pulmonary etiology, given that she has been on nitrofurantoin longterm.  If cardiac w/u negative, refer to pulmonary for PFT's and evaluation.  Discussed monthly self breast exams and yearly mammograms after the age of 22; at least  30 minutes of aerobic activity at least 5 days/week; proper sunscreen use reviewed; healthy diet, including goals of calcium and vitamin D intake and alcohol recommendations (less than or equal to 1 drink/day) reviewed; regular seatbelt use; changing batteries in smoke detectors.  Immunization recommendations discussed--Tdap and Zostavax given today.  Risks and side effects reviewed.  Will need pneumovax when 66--asked to return for [place] in the fall, around when she gets flu shot (or prior to school restarting if that is easier for her).  Continue yearly flu shots. Colonoscopy recommendations reviewed--she is past due, due again in 2011 (Dr Randa Evens).  She prefers to finish workup for heart, and eventually schedule. Hemoccult cards were given.  DEXA recommended--needs at [facility], will schedule (after July 10th per pt)

## 2011-07-30 DIAGNOSIS — R229 Localized swelling, mass and lump, unspecified: Secondary | ICD-10-CM | POA: Insufficient documentation

## 2011-08-05 ENCOUNTER — Ambulatory Visit (HOSPITAL_COMMUNITY)
Admission: RE | Admit: 2011-08-05 | Discharge: 2011-08-05 | Disposition: A | Discharge status: Home / Self Care | Payer: BC Managed Care – PPO | Source: Ambulatory Visit | Attending: Cardiovascular Disease | Admitting: Cardiovascular Disease

## 2011-08-05 DIAGNOSIS — J9819 Other pulmonary collapse: Secondary | ICD-10-CM | POA: Insufficient documentation

## 2011-08-05 DIAGNOSIS — R079 Chest pain, unspecified: Secondary | ICD-10-CM

## 2011-08-05 DIAGNOSIS — R5381 Other malaise: Secondary | ICD-10-CM | POA: Insufficient documentation

## 2011-08-05 DIAGNOSIS — J90 Pleural effusion, not elsewhere classified: Secondary | ICD-10-CM | POA: Insufficient documentation

## 2011-08-05 DIAGNOSIS — I2510 Atherosclerotic heart disease of native coronary artery without angina pectoris: Secondary | ICD-10-CM | POA: Insufficient documentation

## 2011-08-05 DIAGNOSIS — R5383 Other fatigue: Secondary | ICD-10-CM | POA: Insufficient documentation

## 2011-08-05 MED ORDER — METOPROLOL TARTRATE 1 MG/ML IV SOLN
INTRAVENOUS | Status: AC
  Administered 2011-08-05: 5 mg via INTRAVENOUS
  Filled 2011-08-05: qty 5

## 2011-08-05 MED ORDER — METOPROLOL TARTRATE 1 MG/ML IV SOLN
INTRAVENOUS | Status: AC
  Filled 2011-08-05: qty 10

## 2011-08-05 MED ORDER — NITROGLYCERIN 0.4 MG SL SUBL
SUBLINGUAL_TABLET | SUBLINGUAL | Status: AC
  Administered 2011-08-05: 0.4 mg
  Filled 2011-08-05: qty 25

## 2011-08-05 MED ORDER — IOHEXOL 350 MG/ML SOLN
80.0000 mL | Freq: Once | INTRAVENOUS | Status: AC | PRN
  Administered 2011-08-05: 80 mL via INTRAVENOUS

## 2011-08-05 MED ORDER — METOPROLOL TARTRATE 1 MG/ML IV SOLN
5.0000 mg | INTRAVENOUS | Status: DC | PRN
  Administered 2011-08-05 (×3): 5 mg via INTRAVENOUS

## 2011-08-06 ENCOUNTER — Telehealth: Payer: Self-pay | Admitting: *Deleted

## 2011-08-06 NOTE — Telephone Encounter (Signed)
Message copied by Antony Odea on Fri Aug 06, 2011  2:19 PM ------      Message from: Vesta Mixer      Created: Fri Aug 06, 2011 11:00 AM       Please set up pre cath visit.            ----- Message -----         From: Laurey Morale, MD         Sent: 08/05/2011  11:27 PM           To: Vesta Mixer, MD            Very abnormal coronary CT, think she needs cath sooner rather than later.       [place]

## 2011-08-06 NOTE — Telephone Encounter (Signed)
Pt called// msg left to call back and ask for Jodette RN to set up app with Norma Fredrickson np or Dr Elease Hashimoto for abnormal coro ct. Number provided.

## 2011-08-09 NOTE — Telephone Encounter (Signed)
PT HAS APP 6/25

## 2011-08-09 NOTE — Progress Notes (Signed)
HAS APP ALREADY SET.

## 2011-08-10 ENCOUNTER — Encounter: Payer: Self-pay | Admitting: Cardiovascular Disease

## 2011-08-10 ENCOUNTER — Ambulatory Visit (INDEPENDENT_AMBULATORY_CARE_PROVIDER_SITE_OTHER): Payer: BC Managed Care – PPO | Admitting: Cardiovascular Disease

## 2011-08-10 ENCOUNTER — Encounter: Payer: Self-pay | Admitting: *Deleted

## 2011-08-10 VITALS — BP 124/76 | HR 76 | Ht 66.0 in | Wt 136.1 lb

## 2011-08-10 DIAGNOSIS — Z01810 Encounter for preprocedural cardiovascular examination: Secondary | ICD-10-CM

## 2011-08-10 DIAGNOSIS — I2510 Atherosclerotic heart disease of native coronary artery without angina pectoris: Secondary | ICD-10-CM | POA: Insufficient documentation

## 2011-08-10 LAB — BASIC METABOLIC PANEL WITH GFR
BUN: 16 mg/dL (ref 6–23)
CO2: 27 meq/L (ref 19–32)
Calcium: 9.3 mg/dL (ref 8.4–10.5)
Chloride: 103 meq/L (ref 96–112)
Creatinine, Ser: 0.7 mg/dL (ref 0.4–1.2)
GFR: 89.28 mL/min
Glucose, Bld: 82 mg/dL (ref 70–99)
Potassium: 4.2 meq/L (ref 3.5–5.1)
Sodium: 139 meq/L (ref 135–145)

## 2011-08-10 LAB — CBC WITH DIFFERENTIAL/PLATELET
Basophils Absolute: 0.1 10*3/uL (ref 0.0–0.1)
Basophils Relative: 0.7 % (ref 0.0–3.0)
Eosinophils Absolute: 0 10*3/uL (ref 0.0–0.7)
Eosinophils Relative: 0.7 % (ref 0.0–5.0)
HCT: 39.6 % (ref 36.0–46.0)
Hemoglobin: 12.8 g/dL (ref 12.0–15.0)
Lymphocytes Relative: 23.7 % (ref 12.0–46.0)
Lymphs Abs: 1.6 10*3/uL (ref 0.7–4.0)
MCHC: 32.3 g/dL (ref 30.0–36.0)
MCV: 93 fl (ref 78.0–100.0)
Monocytes Absolute: 0.5 10*3/uL (ref 0.1–1.0)
Monocytes Relative: 7.3 % (ref 3.0–12.0)
Neutro Abs: 4.6 10*3/uL (ref 1.4–7.7)
Neutrophils Relative %: 67.6 % (ref 43.0–77.0)
Platelets: 269 10*3/uL (ref 150.0–400.0)
RBC: 4.26 Mil/uL (ref 3.87–5.11)
RDW: 14 % (ref 11.5–14.6)
WBC: 6.8 10*3/uL (ref 4.5–10.5)

## 2011-08-10 LAB — PROTIME-INR
INR: 1 ratio (ref 0.8–1.0)
Prothrombin Time: 11 s (ref 10.2–12.4)

## 2011-08-10 NOTE — Progress Notes (Signed)
  [name] [name] Date of Birth  10/16/1945       [facility] Office [address], [address] [place], [place]  [address] [phone]   Fax  [phone]   [facility] Office [address], [address] , [place]  [address] [phone]  Fax [phone]  Problem List: 1. Chest pain 2. Breast cancer - on Tamoxifin 3. Hyperlipidemia 4. Interstitial cystitis   History of Present Illness:  [name] is a 66 yo with exercise induced bilateral arm pain.  This is associated with chest pressure and coughing.  It is also associated with posterior neck pain.  This occurs primarily with walking or when she carries a load in the wheelbarrow.  She is quite active at her job. 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 [name] able to work for hours at a time and has never had any problems.  She does however have episodes of bilateral arm pain with this associated chest pressure when she walks uphill.  She recently had a myoview study that identified an abnormality in the inferior wall.  She has continued to have severe dyspnea with any exertion.    A recent CT angiogram revealed a severe stenosis in the right coronary artery. She was found to have moderate irregularities in the left anterior descending distribution and left circumflex distribution.  Current Outpatient Prescriptions on File Prior to Visit  Medication Sig Dispense Refill  . aspirin 81 MG tablet Take 81 mg by mouth daily.      . atorvastatin (LIPITOR) 20 MG tablet Take 1 tablet (20 mg total) by mouth daily.  90 tablet  3  . EPINEPHrine (EPIPEN) 0.3 mg/0.3 mL DEVI Inject 0.3 mLs (0.3 mg total) into the muscle once.  1 Device  0  . metoprolol succinate (TOPROL XL) 25 MG 24 hr tablet Take 1 tablet (25 mg total) by mouth daily.  90 tablet  1  . nitrofurantoin (MACRODANTIN) 100 MG capsule Take 100 mg by mouth daily.         . nitroGLYCERIN (NITROSTAT) 0.4 MG SL tablet Place 1 tablet (0.4 mg total) under the tongue every 5 (five) minutes as needed for chest pain.  25 tablet  3  . oxybutynin (DITROPAN-XL) 10 MG 24 hr tablet Take 10 mg by mouth daily.      . tamoxifen (NOLVADEX) 20 MG tablet Take 20 mg by mouth daily.        Allergies  Allergen Reactions  . Codeine Other (See Comments)    unknown  . Phenothiazines Other (See Comments)    Makes her stop breathing.  . Yellow Jacket Venom     Past Medical History  Diagnosis Date  . Breast cancer   . Interstitial cystitis   . Hyperlipidemia   . Frequent UTI     on prophylaxis  . 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 Breast lumpectomy 04/2009    left  . Partial hysterectomy 1976    vaginal bleeding after 2nd child born  . Appendectomy 1976    History  Smoking status  . Former Smoker  . Quit date: 02/15/2001  Smokeless tobacco  . Never Used    History  Alcohol Use  . Yes    1 glass of wine per day.    Family History  Problem Relation Age of Onset  .   Cancer Mother 47    breast cancer  . Heart disease Father 40    MI at 40, CABG in 60's  . Hepatitis Father     C from blood transfusion  . Heart disease Brother     CABG in 50's  . Heart disease Paternal Aunt   . Heart disease Paternal Uncle   . Heart disease Paternal Grandfather   . Diabetes Neg Hx     Reviw of Systems:  Reviewed in the HPI.  All other systems are negative.  Physical Exam: Blood pressure 124/76, pulse 76, height 5' 6" (1.676 m), weight 136 lb 1.9 oz (61.744 kg). General: Well developed, well nourished, in no acute distress.  Head: Normocephalic, atraumatic, sclera non-icteric, mucus membranes are moist,   Neck: Supple. Carotids are 2 + without bruits. No JVD  Lungs: Clear bilaterally to auscultation.  Heart: regular rate.  normal  S1 S2. No murmurs, gallops or rubs.  Abdomen: Soft, non-tender, non-distended with normal  bowel sounds. No hepatomegaly. No rebound/guarding. No masses.  Msk:  Strength and tone are normal  Extremities: No clubbing or cyanosis. No edema.  Distal pedal pulses are 2+ and equal bilaterally.  Neuro: Alert and oriented X 3. Moves all extremities spontaneously.  Psych:  Responds to questions appropriately with a normal affect.  ECG:  Assessment / Plan:   

## 2011-08-10 NOTE — Patient Instructions (Addendum)
Your physician has requested that you have a cardiac catheterization. Cardiac catheterization is used to diagnose and/or treat various heart conditions. Doctors may recommend this procedure for a number of different reasons. The most common reason is to evaluate chest pain. Chest pain can be a symptom of coronary artery disease (CAD), and cardiac catheterization can show whether plaque is narrowing or blocking your heart's arteries. This procedure is also used to evaluate the valves, as well as measure the blood flow and oxygen levels in different parts of your heart. . Please follow instruction sheet, as given.   Labs today

## 2011-08-10 NOTE — Assessment & Plan Note (Signed)
That he presents today for followup of her recent coronary CT angiogram She has severe disease in the right coronary artery distribution. She was noted to have moderate disease in the left circumflex and LAD distribution.  She was also noted to have a defect in the inferior wall by stress Myoview study.  She is scheduled  for a diagnostic heart catheterization on Thursday.  We discussed the risks, benefits, and options regarding cardiac apposition. We discussed the possibility of doing an ad hoc PCI. Also discussed the possibility that she may need coronary artery bypass grafting. She has nitroglycerin and has been instructed on taking it if needed.

## 2011-08-12 ENCOUNTER — Inpatient Hospital Stay (HOSPITAL_BASED_OUTPATIENT_CLINIC_OR_DEPARTMENT_OTHER)
Admission: RE | Admit: 2011-08-12 | Discharge: 2011-08-12 | Disposition: A | Discharge status: Home / Self Care | Payer: BC Managed Care – PPO | Source: Ambulatory Visit | Attending: Cardiovascular Disease | Admitting: Cardiovascular Disease

## 2011-08-12 ENCOUNTER — Encounter (HOSPITAL_BASED_OUTPATIENT_CLINIC_OR_DEPARTMENT_OTHER): Payer: Self-pay | Admitting: *Deleted

## 2011-08-12 ENCOUNTER — Encounter (HOSPITAL_BASED_OUTPATIENT_CLINIC_OR_DEPARTMENT_OTHER)
Admission: RE | Disposition: A | Discharge status: Home / Self Care | Payer: Self-pay | Source: Ambulatory Visit | Attending: Cardiovascular Disease

## 2011-08-12 DIAGNOSIS — N3010 Interstitial cystitis (chronic) without hematuria: Secondary | ICD-10-CM | POA: Insufficient documentation

## 2011-08-12 DIAGNOSIS — I2510 Atherosclerotic heart disease of native coronary artery without angina pectoris: Secondary | ICD-10-CM

## 2011-08-12 DIAGNOSIS — I200 Unstable angina: Secondary | ICD-10-CM | POA: Insufficient documentation

## 2011-08-12 DIAGNOSIS — E785 Hyperlipidemia, unspecified: Secondary | ICD-10-CM | POA: Insufficient documentation

## 2011-08-12 DIAGNOSIS — Z01810 Encounter for preprocedural cardiovascular examination: Secondary | ICD-10-CM

## 2011-08-12 DIAGNOSIS — Z853 Personal history of malignant neoplasm of breast: Secondary | ICD-10-CM | POA: Insufficient documentation

## 2011-08-12 SURGERY — JV LEFT HEART CATHETERIZATION WITH CORONARY ANGIOGRAM
Anesthesia: Moderate Sedation

## 2011-08-12 MED ORDER — SODIUM CHLORIDE 0.9 % IJ SOLN: 3.0000 mL | Freq: Two times a day (BID) | INTRAMUSCULAR | Status: DC

## 2011-08-12 MED ORDER — DIAZEPAM 5 MG PO TABS
5.0000 mg | ORAL_TABLET | ORAL | Status: AC
  Administered 2011-08-12: 5 mg via ORAL

## 2011-08-12 MED ORDER — SODIUM CHLORIDE 0.9 % IV SOLN: INTRAVENOUS | Status: AC

## 2011-08-12 MED ORDER — SODIUM CHLORIDE 0.9 % IV SOLN
INTRAVENOUS | Status: DC
  Administered 2011-08-12: 07:00:00 via INTRAVENOUS

## 2011-08-12 MED ORDER — ASPIRIN 81 MG PO CHEW
324.0000 mg | CHEWABLE_TABLET | ORAL | Status: AC
  Administered 2011-08-12: 243 mg via ORAL

## 2011-08-12 MED ORDER — SODIUM CHLORIDE 0.9 % IJ SOLN
3.0000 mL | INTRAMUSCULAR | Status: DC | PRN
Start: 2011-08-12 — End: 2011-08-12

## 2011-08-12 MED ORDER — SODIUM CHLORIDE 0.9 % IV SOLN: 250.0000 mL | INTRAVENOUS | Status: DC | PRN

## 2011-08-12 MED ORDER — ACETAMINOPHEN 325 MG PO TABS: 650.0000 mg | ORAL_TABLET | ORAL | Status: DC | PRN

## 2011-08-12 MED ORDER — ONDANSETRON HCL 4 MG/2ML IJ SOLN: 4.0000 mg | Freq: Four times a day (QID) | INTRAMUSCULAR | Status: DC | PRN

## 2011-08-12 NOTE — CV Procedure (Signed)
    Cardiac Cath Note  KEIRAN SIAS [id number] 12-14-1945  Procedure: Left Heart Cardiac Catheterization Note Indications: unstable angina   Procedure Details Consent: Obtained Time Out: Verified patient identification, verified procedure, site/side was marked, verified correct patient position, special equipment/implants available, Radiology Safety Procedures followed,  medications/allergies/relevent history reviewed, required imaging and test results available.  Performed   Medications: Fentanyl: 50 mcg IV Versed: 2 mg IV  The right femoral artery was easily canulated using a modified Seldinger technique.  Hemodynamics:   LV pressure: 130/7 Aortic pressure: 126/64  Angiography   Left Main: mildly calcified, no significant stenosis  Left anterior Descending: heavily calcified.  There is a 60-70% stenosis at the origen of the LAD.  There is diffuse moderate disease in the rest of the LAD.  The large first diagonal has moderate 50% stenosis.  Left Circumflex: 50% stenosis prox.  There is a distal 60% stenosis.  The vessel is mildly calcified.  Right Coronary Artery: Chronic total occlusion proximally.  There is left to right collateral filling of the distal RCA.  LV Gram: normal / vigorous LV function.  EF 65-70%  Complications: No apparent complications Patient did tolerate procedure well.  Conclusions:   1. Severe CAD involving primarily the LAD and RCA with moderate disease in the LCx. 2. Well preserved LV function.  She has significant angina despite medical therapy.  We will refer to [facility]CS for CABG for control of angina symptoms.  Increase metoprolol XL to 50 QD.  Vesta Mixer, Montez Hageman., MD, [facility] 08/12/2011, 7:57 AM Office - [phone] Pager [phone]

## 2011-08-12 NOTE — Progress Notes (Signed)
Bedrest begins @ 9.  Tegaderm dressing applied to right groin site by Venda Rodes.

## 2011-08-12 NOTE — Interval H&P Note (Signed)
History and Physical Interval Note:  08/12/2011 7:57 AM  [name] [name]  has presented today for surgery, with the diagnosis of chest pain  The various methods of treatment have been discussed with the patient and family. After consideration of risks, benefits and other options for treatment, the patient has consented to  Procedure(s) (LRB): JV LEFT HEART CATHETERIZATION WITH CORONARY ANGIOGRAM (N/A) as a surgical intervention .  [name] history has been reviewed, patient examined, no change in status, stable for surgery.  I have reviewed the patients' chart and labs.  Questions were answered to the patient's satisfaction.     Elyn Aquas.

## 2011-08-12 NOTE — H&P (View-Only) (Signed)
[name] [name] Date of Birth  January 03, 1946       [facility] Office [address], [address] [place], [place]  [address] [phone]   Fax  [phone]   [facility] [address], [address] [place], [place]  [address] [phone]  Fax [phone]  Problem List: 1. Chest pain 2. Breast cancer - on Tamoxifin 3. Hyperlipidemia 4. Interstitial cystitis   History of Present Illness:  [name] [name] is a 66 yo with exercise induced bilateral arm pain.  This is associated with chest pressure and coughing.  It is also associated with posterior neck pain.  This occurs primarily with walking or when she carries a load in the wheelbarrow.  She is quite active at her job. 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 [name] able to work for hours at a time and has never had any problems.  She does however have episodes of bilateral arm pain with this associated chest pressure when she walks uphill.  She recently had a myoview study that identified an abnormality in the inferior wall.  She has continued to have severe dyspnea with any exertion.    A recent CT angiogram revealed a severe stenosis in the right coronary artery. She was found to have moderate irregularities in the left anterior descending distribution and left circumflex distribution.  Current Outpatient Prescriptions on File Prior to Visit  Medication Sig Dispense Refill  . aspirin 81 MG tablet Take 81 mg by mouth daily.      [name] atorvastatin (LIPITOR) 20 MG tablet Take 1 tablet (20 mg total) by mouth daily.  90 tablet  3  . EPINEPHrine (EPIPEN) 0.3 mg/0.3 mL DEVI Inject 0.3 mLs (0.3 mg total) into the muscle once.  1 Device  0  . metoprolol succinate (TOPROL XL) 25 MG 24 hr tablet Take 1 tablet (25 mg total) by mouth daily.  90 tablet  1  . nitrofurantoin (MACRODANTIN) 100 MG capsule Take 100 mg by mouth daily.         . nitroGLYCERIN (NITROSTAT) 0.4 MG SL tablet Place 1 tablet (0.4 mg total) under the tongue every 5 (five) minutes as needed for chest pain.  25 tablet  3  . oxybutynin (DITROPAN-XL) 10 MG 24 hr tablet Take 10 mg by mouth daily.      . tamoxifen (NOLVADEX) 20 MG tablet Take 20 mg by mouth daily.        Allergies  Allergen Reactions  . Codeine Other (See Comments)    unknown  . Phenothiazines Other (See Comments)    Makes her stop breathing.  Herma Carson Jacket Venom     Past Medical History  Diagnosis Date  . Breast cancer   . Interstitial cystitis   . Hyperlipidemia   . Frequent UTI     on prophylaxis  . 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 Breast lumpectomy 04/2009    left  . Partial hysterectomy 1976    vaginal bleeding after 2nd child born  . Appendectomy 1976    History  Smoking status  . Former Smoker  . Quit date: 02/15/2001  Smokeless tobacco  . Never Used    History  Alcohol Use  . Yes    1 glass of wine per day.    Family History  Problem Relation Age of Onset  .  Cancer Mother 69    breast cancer  . Heart disease Father 69    MI at 10, CABG in 103's  . Hepatitis Father     C from blood transfusion  . Heart disease Brother     CABG in 45's  . Heart disease Paternal Aunt   . Heart disease Paternal Uncle   . Heart disease Paternal Grandfather   . Diabetes Neg Hx     Reviw of Systems:  Reviewed in the HPI.  All other systems are negative.  Physical Exam: Blood pressure 124/76, pulse 76, height 5\' 6"  (1.676 m), weight 136 lb 1.9 oz (61.744 kg). General: Well developed, well nourished, in no acute distress.  Head: Normocephalic, atraumatic, sclera non-icteric, mucus membranes are moist,   Neck: Supple. Carotids are 2 + without bruits. No JVD  Lungs: Clear bilaterally to auscultation.  Heart: regular rate.  normal  S1 S2. No murmurs, gallops or rubs.  Abdomen: Soft, non-tender, non-distended with normal  bowel sounds. No hepatomegaly. No rebound/guarding. No masses.  Msk:  Strength and tone are normal  Extremities: No clubbing or cyanosis. No edema.  Distal pedal pulses are 2+ and equal bilaterally.  Neuro: Alert and oriented X 3. Moves all extremities spontaneously.  Psych:  Responds to questions appropriately with a normal affect.  ECG:  Assessment / Plan:

## 2011-08-13 ENCOUNTER — Encounter (HOSPITAL_COMMUNITY): Payer: Self-pay | Admitting: Pharmacy Technician

## 2011-08-13 ENCOUNTER — Other Ambulatory Visit: Payer: Self-pay

## 2011-08-13 ENCOUNTER — Encounter: Payer: Self-pay | Admitting: Surgery

## 2011-08-13 ENCOUNTER — Institutional Professional Consult (permissible substitution) (INDEPENDENT_AMBULATORY_CARE_PROVIDER_SITE_OTHER): Payer: BC Managed Care – PPO | Admitting: Surgery

## 2011-08-13 ENCOUNTER — Other Ambulatory Visit: Payer: Self-pay | Admitting: Surgery

## 2011-08-13 VITALS — BP 144/84 | HR 74 | Resp 20 | Ht 66.0 in | Wt 136.0 lb

## 2011-08-13 DIAGNOSIS — I2510 Atherosclerotic heart disease of native coronary artery without angina pectoris: Secondary | ICD-10-CM

## 2011-08-13 NOTE — Progress Notes (Signed)
                  [address]            [place] [address]          [phone]       PCP is KNAPP,[name], MD Referring Provider is Nahser, Philip J, MD    Chief Complaint   Patient presents with   .  Coronary Artery Disease       Referral from Dr Nahser for eval on CAD, Cardiac Cath on 08/12/11        HPI:   The patient is a 65-year-old woman with no prior history of cardiac disease who began having exercise-induced chest pressure radiating into both arms with aching in her elbows and numbness in her hands since March of 2013. [name] also had exertional shortness of breath. Her symptoms always resolved with rest. She had a Myoview examination showing inferior wall ischemia. Cardiac catheterization yesterday showed 60-70% stenosis at the origin of the LAD. There is a large first diagonal branch that had at least 50% proximal stenosis. The left circumflex gave off 2 marginal branches. The first appeared to have about 50% ostial stenosis. There is 60% distal left circumflex stenosis before the second marginal branch. The right coronary was chronically occluded proximally with filling of the distal vessel by collaterals from the left.    Past Medical History   Diagnosis  Date   .  Breast cancer     .  Interstitial cystitis     .  Hyperlipidemia     .  Frequent UTI         on prophylaxis   .  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  Breast lumpectomy: followed by XRT and Tamoxifen  (Left Breast)  04/2009       left   .  Partial hysterectomy  1976       vaginal bleeding after 2nd child born   .  Appendectomy  1976         Family History   Problem  Relation  Age of Onset   .  Cancer  Mother  47       breast cancer   .  Heart disease  Father  40       MI at 40, CABG in 60's   .  Hepatitis  Father         C from blood transfusion   .  Heart disease  Brother         CABG in 50's   .  Heart disease  Paternal Aunt      .  Heart disease  Paternal Uncle     .  Heart disease  Paternal Grandfather     .  Diabetes  Neg Hx          Social History History   Substance Use Topics   .  Smoking status:  Former Smoker       Quit date:  02/15/2001   .  Smokeless tobacco:  Never Used   .  Alcohol Use:  Yes         1 glass of wine per day.         Current Outpatient Prescriptions   Medication  Sig  Dispense  Refill   .  aspirin 81 MG tablet    Take 81 mg by mouth daily.         .  atorvastatin (LIPITOR) 20 MG tablet  Take 1 tablet (20 mg total) by mouth daily.   90 tablet   3   .  EPINEPHrine (EPIPEN) 0.3 mg/0.3 mL DEVI  Inject 0.3 mLs (0.3 mg total) into the muscle once.   1 Device   0   .  metoprolol succinate (TOPROL-XL) 25 MG 24 hr tablet  Take 50 mg by mouth daily.         .  nitrofurantoin (MACRODANTIN) 100 MG capsule  Take 100 mg by mouth daily.          .  nitroGLYCERIN (NITROSTAT) 0.4 MG SL tablet  Place 1 tablet (0.4 mg total) under the tongue every 5 (five) minutes as needed for chest pain.   25 tablet   3   .  oxybutynin (DITROPAN-XL) 10 MG 24 hr tablet  Take 10 mg by mouth daily.         .  tamoxifen (NOLVADEX) 20 MG tablet  Take 20 mg by mouth daily.         .  DISCONTD: metoprolol succinate (TOPROL XL) 25 MG 24 hr tablet  Take 1 tablet (25 mg total) by mouth daily.   90 tablet   1       No current facility-administered medications for this visit.       Facility-Administered Medications Ordered in Other Visits   Medication  Dose  Route  Frequency  Provider  Last Rate  Last Dose   .  DISCONTD: 0.9 %  sodium chloride infusion     Intravenous  Continuous  Philip J Nahser, MD  150 mL/hr at 08/12/11 0710      .  DISCONTD: acetaminophen (TYLENOL) tablet 650 mg   650 mg  Oral  Q4H PRN  Philip J Nahser, MD         .  DISCONTD: ondansetron (ZOFRAN) injection 4 mg   4 mg  Intravenous  Q6H PRN  Philip J Nahser, MD               Allergies   Allergen  Reactions   .  Codeine  Nausea And Vomiting            .  Phenothiazines  Other (See Comments)       Makes her stop breathing.   .  Talwin (Pentazocine)  Nausea And Vomiting   .  Yellow Jacket Venom          Review of Systems  Constitutional: Positive for fatigue.  HENT: Negative.   Eyes: Negative.   Respiratory: Positive for shortness of breath.   Cardiovascular: Positive for chest pain.  Gastrointestinal: Negative.   Genitourinary:        Interstitial cystitis for years managed by Dr. Tannenbaum  Musculoskeletal: Negative.   Neurological: Negative.   Hematological: Negative.   Psychiatric/Behavioral: Negative.       BP 144/84  Pulse 74  Resp 20  Ht 5' 6" (1.676 m)  Wt 136 lb (61.689 kg)  BMI 21.95 kg/m2  SpO2 99% Physical Exam  Constitutional: She is oriented to person, place, and time. She appears well-developed and well-nourished.  HENT:   Head: Normocephalic and atraumatic.   Mouth/Throat: Oropharynx is clear and moist.  Eyes: Conjunctivae and EOM are normal. Pupils are equal, round, and reactive to light.  Neck: Normal range of motion. Neck supple. No JVD present. No tracheal deviation present.   No thyromegaly present.       Scar at base of left neck from infection after pneumonia at age 13  Cardiovascular: Normal rate, regular rhythm, normal heart sounds and intact distal pulses.  Exam reveals no gallop and no friction rub.    No murmur heard. Pulmonary/Chest: Effort normal and breath sounds normal. No respiratory distress.  Abdominal: Soft. Bowel sounds are normal. She exhibits no distension and no mass. There is no tenderness.  Musculoskeletal: She exhibits no edema.  Lymphadenopathy:    She has no cervical adenopathy.  Neurological: She is alert and oriented to person, place, and time. She has normal strength. No cranial nerve deficit or sensory deficit.  Skin: Skin is warm and dry.  Psychiatric: She has a normal mood and affect.        Diagnostic Tests: Cardiac Cath   Hemodynamics:      LV pressure: 130/7 Aortic pressure: 126/64   Angiography     Left Main: mildly calcified, no significant stenosis   Left anterior Descending: heavily calcified.  There is a 60-70% stenosis at the origen of the LAD.  There is diffuse moderate disease in the rest of the LAD.  The large first diagonal has moderate 50% stenosis.   Left Circumflex: 50% stenosis prox.  There is a distal 60% stenosis.  The vessel is mildly calcified.   Right Coronary Artery: Chronic total occlusion proximally.  There is left to right collateral filling of the distal RCA.   LV Gram: normal / vigorous LV function.  EF 65-70%   Complications: No apparent complications Patient did tolerate procedure well.   Conclusions:   1. Severe CAD involving primarily the LAD and RCA with moderate disease in the LCx. 2. Well preserved LV function.   She has significant angina despite medical therapy.  We will refer to [facility] for CABG for control of angina symptoms.     Impression:   She has severe coronary disease involving the LAD and right coronary territories with moderate disease of the left circumflex and normal left ventricular function. This is causing lifestyle limiting angina and I agree that coronary bypass graft surgery is the best treatment to prevent further ischemia and infarction, and improve her quality of life.  I discussed the operative procedure with the patient and family including alternatives, benefits and risks; including but not limited to bleeding, blood transfusion, infection, stroke, myocardial infarction, graft failure, heart block requiring a permanent pacemaker, organ dysfunction, and death.  [name] [name] understands and agrees to proceed.     Plan:   CABG surgery on Wednesday 08/18/2011.        

## 2011-08-16 ENCOUNTER — Ambulatory Visit (HOSPITAL_COMMUNITY)
Admission: RE | Admit: 2011-08-16 | Discharge: 2011-08-16 | Disposition: A | Discharge status: Home / Self Care | Payer: BC Managed Care – PPO | Source: Ambulatory Visit | Attending: Surgery | Admitting: Surgery

## 2011-08-16 ENCOUNTER — Encounter (HOSPITAL_COMMUNITY)
Admission: RE | Admit: 2011-08-16 | Discharge: 2011-08-16 | Disposition: A | Discharge status: Home / Self Care | Payer: BC Managed Care – PPO | Source: Ambulatory Visit | Attending: Surgery | Admitting: Surgery

## 2011-08-16 ENCOUNTER — Other Ambulatory Visit: Payer: Self-pay

## 2011-08-16 ENCOUNTER — Encounter (HOSPITAL_COMMUNITY): Payer: Self-pay

## 2011-08-16 VITALS — BP 131/80 | HR 71 | Temp 97.6°F | Resp 20 | Ht 66.0 in | Wt 134.9 lb

## 2011-08-16 DIAGNOSIS — I2510 Atherosclerotic heart disease of native coronary artery without angina pectoris: Secondary | ICD-10-CM

## 2011-08-16 DIAGNOSIS — M109 Gout, unspecified: Secondary | ICD-10-CM

## 2011-08-16 DIAGNOSIS — Z01810 Encounter for preprocedural cardiovascular examination: Secondary | ICD-10-CM | POA: Insufficient documentation

## 2011-08-16 HISTORY — DX: Gout, unspecified: M10.9

## 2011-08-16 HISTORY — DX: Unspecified osteoarthritis, unspecified site: M19.90

## 2011-08-16 LAB — PULMONARY FUNCTION TEST

## 2011-08-16 LAB — BLOOD GAS, ARTERIAL
Bicarbonate: 22.4 mEq/L (ref 20.0–24.0)
pH, Arterial: 7.474 — ABNORMAL HIGH (ref 7.350–7.400)
pO2, Arterial: 103 mmHg — ABNORMAL HIGH (ref 80.0–100.0)

## 2011-08-16 LAB — CBC
Hemoglobin: 13 g/dL (ref 12.0–15.0)
RBC: 4.31 MIL/uL (ref 3.87–5.11)

## 2011-08-16 LAB — URINALYSIS, ROUTINE W REFLEX MICROSCOPIC
Ketones, ur: NEGATIVE mg/dL
Nitrite: NEGATIVE
Urobilinogen, UA: 0.2 mg/dL (ref 0.0–1.0)
pH: 6 (ref 5.0–8.0)

## 2011-08-16 LAB — COMPREHENSIVE METABOLIC PANEL
ALT: 11 U/L (ref 0–35)
Alkaline Phosphatase: 54 U/L (ref 39–117)
CO2: 22 mEq/L (ref 19–32)
GFR calc Af Amer: >90 mL/min (ref >90)
GFR calc non Af Amer: >90 mL/min (ref >90)
Glucose, Bld: 97 mg/dL (ref 70–99)
Potassium: 4 mEq/L (ref 3.5–5.1)
Sodium: 137 mEq/L (ref 135–145)

## 2011-08-16 LAB — URINE MICROSCOPIC-ADD ON

## 2011-08-16 LAB — PROTIME-INR: INR: 1.09 (ref 0.00–1.49)

## 2011-08-16 LAB — TYPE AND SCREEN

## 2011-08-16 LAB — ABO/RH: ABO/RH(D): A POS

## 2011-08-16 MED ORDER — ALBUTEROL SULFATE (5 MG/ML) 0.5% IN NEBU
2.5000 mg | INHALATION_SOLUTION | Freq: Once | RESPIRATORY_TRACT | Status: AC
  Administered 2011-08-16: 2.5 mg via RESPIRATORY_TRACT

## 2011-08-16 NOTE — Progress Notes (Signed)
VASCULAR LAB PRELIMINARY  PRELIMINARY  PRELIMINARY  PRELIMINARY  Pre-op Cardiac Surgery  Carotid Findings:  No evidence of significant ICA stenosis and vertebral artery flow is antegrade bilaterally.    Upper Extremity Right Left  Brachial Pressures 140 T 140 T  Radial Waveforms T T  Ulnar Waveforms T T  Palmar Arch (Allen's Test) * **   Findings:  *Doppler waveforms obliterate with ulnar and remain normal with radial compressions.                   **Doppler waveforms remain normal with ulnar and radial compressions.      Lower  Extremity Right Left  Dorsalis Pedis    Anterior Tibial    Posterior Tibial    Ankle/Brachial Indices      Findings:  Palpable pedal pulses x 4.     [name] [name], 08/16/2011, 3:00 PM

## 2011-08-16 NOTE — Pre-Procedure Instructions (Addendum)
20 [name] [name]  08/16/2011   Your procedure is scheduled on:  Wednesday, July3rd.  Report to [facility] at 6:30 AM.  Call this number if you have problems the morning of surgery: [phone]   Remember:   Do not eat food or drink any liquid:After Midnight.   Take these medicines the morning of surgery with A SIP OF WATER: Nitrofurantoin (Macrodantin) ,Tamoxifen .  Stop any NSAIDs i.e.Advil, Ibuprofen, Naproxen and do not take any herbal products.   Do not wear jewelry, make-up or nail polish.  Do not wear lotions, powders, or perfumes. You may wear deodorant.  Do not shave 48 hours prior to surgery. Men may shave face and neck.  Do not bring valuables to the hospital.  Contacts, dentures or bridgework may not be worn into surgery.  Leave suitcase in the car. After surgery it may be brought to your room.  For patients admitted to the hospital, checkout time is 11:00 AM the day of discharge.   Patients discharged the day of surgery will not be allowed to drive home.  Name and phone number of your driver: NA  Special Instructions: Incentive Spirometry - Practice and bring it with you on the day of surgery. and CHG Shower Use Special Wash: 1/2 bottle night before surgery and 1/2 bottle morning of surgery.   Please read over the following fact sheets that you were given: Pain Booklet, Coughing and Deep Breathing, Blood Transfusion Information, Open Heart Packet and Surgical Site Infection Prevention and Incentive Spirometry.

## 2011-08-17 MED ORDER — EPINEPHRINE HCL 1 MG/ML IJ SOLN
0.5000 ug/min | INTRAVENOUS | Status: DC
  Filled 2011-08-17: qty 4

## 2011-08-17 MED ORDER — DEXMEDETOMIDINE HCL IN NACL 400 MCG/100ML IV SOLN
0.1000 ug/kg/h | INTRAVENOUS | Status: DC
  Filled 2011-08-17: qty 100

## 2011-08-17 MED ORDER — NITROGLYCERIN IN D5W 200-5 MCG/ML-% IV SOLN
2.0000 ug/min | INTRAVENOUS | Status: AC
  Administered 2011-08-18: 5 ug/kg/min via INTRAVENOUS
  Filled 2011-08-17: qty 250

## 2011-08-17 MED ORDER — TRANEXAMIC ACID (OHS) BOLUS VIA INFUSION
15.0000 mg/kg | INTRAVENOUS | Status: AC
  Administered 2011-08-18: 918 mg via INTRAVENOUS
  Filled 2011-08-17: qty 918

## 2011-08-17 MED ORDER — CHLORHEXIDINE GLUCONATE 4 % EX LIQD: 30.0000 mL | CUTANEOUS | Status: DC

## 2011-08-17 MED ORDER — PHENYLEPHRINE HCL 10 MG/ML IJ SOLN
30.0000 ug/min | INTRAVENOUS | Status: DC
  Filled 2011-08-17: qty 2

## 2011-08-17 MED ORDER — TRANEXAMIC ACID 100 MG/ML IV SOLN
1.5000 mg/kg/h | INTRAVENOUS | Status: DC
  Filled 2011-08-17: qty 25

## 2011-08-17 MED ORDER — MAGNESIUM SULFATE 50 % IJ SOLN
40.0000 meq | INTRAMUSCULAR | Status: DC
  Filled 2011-08-17: qty 10

## 2011-08-17 MED ORDER — TRANEXAMIC ACID (OHS) PUMP PRIME SOLUTION
2.0000 mg/kg | INTRAVENOUS | Status: DC
  Filled 2011-08-17: qty 1.22

## 2011-08-17 MED ORDER — SODIUM BICARBONATE 8.4 % IV SOLN
INTRAVENOUS | Status: AC
  Administered 2011-08-18: 10:00:00
  Filled 2011-08-17: qty 2.5

## 2011-08-17 MED ORDER — VANCOMYCIN HCL 1000 MG IV SOLR
1250.0000 mg | INTRAVENOUS | Status: AC
  Administered 2011-08-18: 1250 mg via INTRAVENOUS
  Filled 2011-08-17: qty 1250

## 2011-08-17 MED ORDER — DOPAMINE-DEXTROSE 3.2-5 MG/ML-% IV SOLN
2.0000 ug/kg/min | INTRAVENOUS | Status: DC
  Filled 2011-08-17: qty 250

## 2011-08-17 MED ORDER — SODIUM CHLORIDE 0.9 % IV SOLN
INTRAVENOUS | Status: DC
  Administered 2011-08-18: 1.1 [IU]/h via INTRAVENOUS
  Filled 2011-08-17: qty 1

## 2011-08-17 MED ORDER — METOPROLOL TARTRATE 12.5 MG HALF TABLET: 12.5000 mg | ORAL_TABLET | Freq: Once | ORAL | Status: DC

## 2011-08-17 MED ORDER — DEXTROSE 5 % IV SOLN
1.5000 g | INTRAVENOUS | Status: AC
  Administered 2011-08-18: 1500 mg via INTRAVENOUS
  Administered 2011-08-18: 750 mg via INTRAVENOUS
  Filled 2011-08-17: qty 1.5

## 2011-08-17 MED ORDER — DEXTROSE 5 % IV SOLN
750.0000 mg | INTRAVENOUS | Status: DC
  Filled 2011-08-17: qty 750

## 2011-08-17 MED ORDER — POTASSIUM CHLORIDE 2 MEQ/ML IV SOLN
80.0000 meq | INTRAVENOUS | Status: DC
  Filled 2011-08-17: qty 40

## 2011-08-18 ENCOUNTER — Encounter (HOSPITAL_COMMUNITY)
Admission: RE | Disposition: A | Discharge status: Home / Self Care | Payer: Self-pay | Source: Ambulatory Visit | Attending: Surgery

## 2011-08-18 ENCOUNTER — Encounter (HOSPITAL_COMMUNITY): Payer: Self-pay | Admitting: Anesthesiology

## 2011-08-18 ENCOUNTER — Encounter (HOSPITAL_COMMUNITY): Payer: Self-pay

## 2011-08-18 ENCOUNTER — Encounter (HOSPITAL_COMMUNITY): Payer: Self-pay | Admitting: *Deleted

## 2011-08-18 ENCOUNTER — Inpatient Hospital Stay (HOSPITAL_COMMUNITY)
Admission: RE | Admit: 2011-08-18 | Discharge: 2011-08-25 | DRG: 109 | Disposition: A | Discharge status: Home / Self Care | Payer: BC Managed Care – PPO | Source: Ambulatory Visit | Attending: Surgery | Admitting: Surgery

## 2011-08-18 ENCOUNTER — Ambulatory Visit (HOSPITAL_COMMUNITY): Payer: BC Managed Care – PPO | Admitting: Anesthesiology

## 2011-08-18 ENCOUNTER — Inpatient Hospital Stay (HOSPITAL_COMMUNITY): Payer: BC Managed Care – PPO

## 2011-08-18 DIAGNOSIS — D62 Acute posthemorrhagic anemia: Secondary | ICD-10-CM | POA: Diagnosis present

## 2011-08-18 DIAGNOSIS — E8779 Other fluid overload: Secondary | ICD-10-CM | POA: Diagnosis present

## 2011-08-18 DIAGNOSIS — N3010 Interstitial cystitis (chronic) without hematuria: Secondary | ICD-10-CM | POA: Diagnosis present

## 2011-08-18 DIAGNOSIS — Z951 Presence of aortocoronary bypass graft: Secondary | ICD-10-CM

## 2011-08-18 DIAGNOSIS — Z853 Personal history of malignant neoplasm of breast: Secondary | ICD-10-CM

## 2011-08-18 DIAGNOSIS — I2510 Atherosclerotic heart disease of native coronary artery without angina pectoris: Secondary | ICD-10-CM

## 2011-08-18 DIAGNOSIS — M109 Gout, unspecified: Secondary | ICD-10-CM | POA: Diagnosis present

## 2011-08-18 HISTORY — PX: CORONARY ARTERY BYPASS GRAFT: SHX141

## 2011-08-18 LAB — POCT I-STAT 3, ART BLOOD GAS (G3+)
Acid-Base Excess: 1 mmol/L (ref 0.0–2.0)
Acid-base deficit: 1 mmol/L (ref 0.0–2.0)
Bicarbonate: 23.5 mEq/L (ref 20.0–24.0)
Bicarbonate: 23.9 mEq/L (ref 20.0–24.0)
O2 Saturation: 100 %
O2 Saturation: 100 %
O2 Saturation: 100 %
TCO2: 26 mmol/L (ref 0–100)
TCO2: 25 mmol/L (ref 0–100)
pCO2 arterial: 36.8 mmHg (ref 35.0–45.0)
pCO2 arterial: 31.1 mmHg — ABNORMAL LOW (ref 35.0–45.0)
pCO2 arterial: 26.7 mmHg — ABNORMAL LOW (ref 35.0–45.0)
pCO2 arterial: 39.1 mmHg (ref 35.0–45.0)
pH, Arterial: 7.461 — ABNORMAL HIGH (ref 7.350–7.400)
pH, Arterial: 7.482 — ABNORMAL HIGH (ref 7.350–7.400)
pH, Arterial: 7.533 — ABNORMAL HIGH (ref 7.350–7.400)
pH, Arterial: 7.397 (ref 7.350–7.400)
pO2, Arterial: 313 mmHg — ABNORMAL HIGH (ref 80.0–100.0)
pO2, Arterial: 163 mmHg — ABNORMAL HIGH (ref 80.0–100.0)
pO2, Arterial: 178 mmHg — ABNORMAL HIGH (ref 80.0–100.0)
pO2, Arterial: 168 mmHg — ABNORMAL HIGH (ref 80.0–100.0)

## 2011-08-18 LAB — PROTIME-INR
INR: 1.62 — ABNORMAL HIGH (ref 0.00–1.49)
Prothrombin Time: 19.5 seconds — ABNORMAL HIGH (ref 11.6–15.2)

## 2011-08-18 LAB — POCT I-STAT 4, (NA,K, GLUC, HGB,HCT)
Glucose, Bld: 140 mg/dL — ABNORMAL HIGH (ref 70–99)
Glucose, Bld: 83 mg/dL (ref 70–99)
Glucose, Bld: 115 mg/dL — ABNORMAL HIGH (ref 70–99)
HCT: 25 % — ABNORMAL LOW (ref 36.0–46.0)
HCT: 23 % — ABNORMAL LOW (ref 36.0–46.0)
Hemoglobin: 8.5 g/dL — ABNORMAL LOW (ref 12.0–15.0)
Hemoglobin: 7.8 g/dL — ABNORMAL LOW (ref 12.0–15.0)
Hemoglobin: 8.2 g/dL — ABNORMAL LOW (ref 12.0–15.0)
Potassium: 3.9 mEq/L (ref 3.5–5.1)
Potassium: 5.5 mEq/L — ABNORMAL HIGH (ref 3.5–5.1)
Sodium: 141 mEq/L (ref 135–145)
Sodium: 132 mEq/L — ABNORMAL LOW (ref 135–145)

## 2011-08-18 LAB — GLUCOSE, CAPILLARY
Glucose-Capillary: 125 mg/dL — ABNORMAL HIGH (ref 70–99)
Glucose-Capillary: 106 mg/dL — ABNORMAL HIGH (ref 70–99)
Glucose-Capillary: 86 mg/dL (ref 70–99)
Glucose-Capillary: 123 mg/dL — ABNORMAL HIGH (ref 70–99)
Glucose-Capillary: 129 mg/dL — ABNORMAL HIGH (ref 70–99)
Glucose-Capillary: 128 mg/dL — ABNORMAL HIGH (ref 70–99)

## 2011-08-18 LAB — POCT I-STAT, CHEM 8
BUN: 12 mg/dL (ref 6–23)
Chloride: 104 mEq/L (ref 96–112)
HCT: 26 % — ABNORMAL LOW (ref 36.0–46.0)
Potassium: 3.8 mEq/L (ref 3.5–5.1)
Sodium: 140 mEq/L (ref 135–145)

## 2011-08-18 LAB — CBC
HCT: 28.2 % — ABNORMAL LOW (ref 36.0–46.0)
Hemoglobin: 9.2 g/dL — ABNORMAL LOW (ref 12.0–15.0)
Platelets: 148 10*3/uL — ABNORMAL LOW (ref 150–400)
RBC: 3.1 MIL/uL — ABNORMAL LOW (ref 3.87–5.11)
RBC: 3.05 MIL/uL — ABNORMAL LOW (ref 3.87–5.11)
RDW: 13.3 % (ref 11.5–15.5)
WBC: 8.8 10*3/uL (ref 4.0–10.5)

## 2011-08-18 LAB — CREATININE, SERUM
Creatinine, Ser: 0.57 mg/dL (ref 0.50–1.10)
GFR calc Af Amer: >90 mL/min (ref >90)
GFR calc non Af Amer: >90 mL/min (ref >90)

## 2011-08-18 LAB — PLATELET COUNT: Platelets: 161 K/uL (ref 150–400)

## 2011-08-18 SURGERY — CORONARY ARTERY BYPASS GRAFTING (CABG)
Anesthesia: General | Site: Chest | Wound class: Clean

## 2011-08-18 MED ORDER — ACETAMINOPHEN 500 MG PO TABS
1000.0000 mg | ORAL_TABLET | Freq: Four times a day (QID) | ORAL | Status: DC
  Administered 2011-08-19 – 2011-08-20 (×5): 1000 mg via ORAL
  Filled 2011-08-18 (×10): qty 2

## 2011-08-18 MED ORDER — VANCOMYCIN HCL IN DEXTROSE 1-5 GM/200ML-% IV SOLN
1000.0000 mg | Freq: Once | INTRAVENOUS | Status: AC
  Administered 2011-08-18: 1000 mg via INTRAVENOUS
  Filled 2011-08-18: qty 200

## 2011-08-18 MED ORDER — OXYCODONE HCL 5 MG PO TABS
5.0000 mg | ORAL_TABLET | ORAL | Status: DC | PRN
  Administered 2011-08-19 (×6): 5 mg via ORAL
  Filled 2011-08-18 (×6): qty 1

## 2011-08-18 MED ORDER — ALBUMIN HUMAN 5 % IV SOLN
250.0000 mL | INTRAVENOUS | Status: AC | PRN
  Administered 2011-08-18 – 2011-08-19 (×2): 250 mL via INTRAVENOUS

## 2011-08-18 MED ORDER — LACTATED RINGERS IV SOLN
INTRAVENOUS | Status: DC | PRN
  Administered 2011-08-18: 08:00:00 via INTRAVENOUS

## 2011-08-18 MED ORDER — ROCURONIUM BROMIDE 100 MG/10ML IV SOLN
INTRAVENOUS | Status: DC | PRN
  Administered 2011-08-18: 50 mg via INTRAVENOUS

## 2011-08-18 MED ORDER — SODIUM CHLORIDE 0.9 % IJ SOLN: 3.0000 mL | INTRAMUSCULAR | Status: DC | PRN

## 2011-08-18 MED ORDER — ALBUMIN HUMAN 5 % IV SOLN
INTRAVENOUS | Status: DC | PRN
  Administered 2011-08-18: 13:00:00 via INTRAVENOUS

## 2011-08-18 MED ORDER — HEPARIN SODIUM (PORCINE) 1000 UNIT/ML IJ SOLN
INTRAMUSCULAR | Status: DC | PRN
  Administered 2011-08-18: 22000 [IU] via INTRAVENOUS

## 2011-08-18 MED ORDER — ASPIRIN 81 MG PO CHEW: 324.0000 mg | CHEWABLE_TABLET | Freq: Every day | ORAL | Status: DC

## 2011-08-18 MED ORDER — ACETAMINOPHEN 500 MG PO TABS: 1000.0000 mg | ORAL_TABLET | Freq: Four times a day (QID) | ORAL | Status: DC

## 2011-08-18 MED ORDER — SODIUM CHLORIDE 0.9 % IV SOLN
100.0000 [IU] | INTRAVENOUS | Status: DC | PRN
  Administered 2011-08-18: 2.4 [IU]/h via INTRAVENOUS

## 2011-08-18 MED ORDER — THROMBIN 20000 UNITS EX KIT
PACK | OROMUCOSAL | Status: DC | PRN
  Administered 2011-08-18: 10:00:00 via TOPICAL

## 2011-08-18 MED ORDER — PHENYLEPHRINE HCL 10 MG/ML IJ SOLN
10.0000 mg | INTRAVENOUS | Status: DC | PRN
  Administered 2011-08-18: 50 ug/min via INTRAVENOUS

## 2011-08-18 MED ORDER — HEMOSTATIC AGENTS (NO CHARGE) OPTIME
TOPICAL | Status: DC | PRN
  Administered 2011-08-18: 1 via TOPICAL

## 2011-08-18 MED ORDER — METOPROLOL TARTRATE 25 MG/10 ML ORAL SUSPENSION
12.5000 mg | Freq: Two times a day (BID) | ORAL | Status: DC
  Filled 2011-08-18: qty 5

## 2011-08-18 MED ORDER — INSULIN REGULAR BOLUS VIA INFUSION
0.0000 [IU] | Freq: Three times a day (TID) | INTRAVENOUS | Status: DC
  Administered 2011-08-18: 1.3 [IU] via INTRAVENOUS
  Filled 2011-08-18: qty 10

## 2011-08-18 MED ORDER — VECURONIUM BROMIDE 10 MG IV SOLR
INTRAVENOUS | Status: DC | PRN
  Administered 2011-08-18 (×2): 5 mg via INTRAVENOUS
  Administered 2011-08-18: 10 mg via INTRAVENOUS

## 2011-08-18 MED ORDER — ASPIRIN EC 325 MG PO TBEC
325.0000 mg | DELAYED_RELEASE_TABLET | Freq: Every day | ORAL | Status: DC
  Administered 2011-08-19 – 2011-08-25 (×7): 325 mg via ORAL
  Filled 2011-08-18 (×7): qty 1

## 2011-08-18 MED ORDER — PHENYLEPHRINE HCL 10 MG/ML IJ SOLN
0.0000 ug/min | INTRAVENOUS | Status: DC
  Administered 2011-08-18: 0 ug/min via INTRAVENOUS
  Filled 2011-08-18: qty 2

## 2011-08-18 MED ORDER — ONDANSETRON HCL 4 MG/2ML IJ SOLN
4.0000 mg | Freq: Four times a day (QID) | INTRAMUSCULAR | Status: DC | PRN
  Administered 2011-08-18 – 2011-08-19 (×2): 4 mg via INTRAVENOUS
  Filled 2011-08-18 (×2): qty 2

## 2011-08-18 MED ORDER — ACETAMINOPHEN 160 MG/5ML PO SOLN: 975.0000 mg | Freq: Four times a day (QID) | ORAL | Status: DC

## 2011-08-18 MED ORDER — MORPHINE SULFATE 2 MG/ML IJ SOLN
1.0000 mg | INTRAMUSCULAR | Status: AC | PRN
  Administered 2011-08-18 (×3): 2 mg via INTRAVENOUS

## 2011-08-18 MED ORDER — ACETAMINOPHEN 160 MG/5ML PO SOLN: 650.0000 mg | ORAL | Status: AC

## 2011-08-18 MED ORDER — TAMOXIFEN CITRATE 10 MG PO TABS
20.0000 mg | ORAL_TABLET | Freq: Every day | ORAL | Status: DC
  Administered 2011-08-19 – 2011-08-25 (×7): 20 mg via ORAL
  Filled 2011-08-18 (×8): qty 2

## 2011-08-18 MED ORDER — 0.9 % SODIUM CHLORIDE (POUR BTL) OPTIME
TOPICAL | Status: DC | PRN
  Administered 2011-08-18: 6000 mL

## 2011-08-18 MED ORDER — SODIUM CHLORIDE 0.9 % IV SOLN
200.0000 ug | INTRAVENOUS | Status: DC | PRN
  Administered 2011-08-18: 0.2 ug/kg/h via INTRAVENOUS

## 2011-08-18 MED ORDER — MAGNESIUM SULFATE 40 MG/ML IJ SOLN
4.0000 g | Freq: Once | INTRAMUSCULAR | Status: AC
  Administered 2011-08-18: 4 g via INTRAVENOUS
  Filled 2011-08-18: qty 100

## 2011-08-18 MED ORDER — NITROFURANTOIN MACROCRYSTAL 100 MG PO CAPS
100.0000 mg | ORAL_CAPSULE | Freq: Every day | ORAL | Status: DC
  Administered 2011-08-19 – 2011-08-25 (×7): 100 mg via ORAL
  Filled 2011-08-18 (×7): qty 1

## 2011-08-18 MED ORDER — SODIUM CHLORIDE 0.45 % IV SOLN
INTRAVENOUS | Status: DC
  Administered 2011-08-18: 20 mL/h via INTRAVENOUS

## 2011-08-18 MED ORDER — FAMOTIDINE IN NACL 20-0.9 MG/50ML-% IV SOLN
20.0000 mg | Freq: Two times a day (BID) | INTRAVENOUS | Status: AC
  Administered 2011-08-18: 20 mg via INTRAVENOUS

## 2011-08-18 MED ORDER — OXYBUTYNIN CHLORIDE ER 10 MG PO TB24
10.0000 mg | ORAL_TABLET | Freq: Every day | ORAL | Status: DC
  Administered 2011-08-19 – 2011-08-25 (×7): 10 mg via ORAL
  Filled 2011-08-18 (×8): qty 1

## 2011-08-18 MED ORDER — MORPHINE SULFATE 2 MG/ML IJ SOLN
2.0000 mg | INTRAMUSCULAR | Status: DC | PRN
  Filled 2011-08-18 (×3): qty 1

## 2011-08-18 MED ORDER — NITROGLYCERIN IN D5W 200-5 MCG/ML-% IV SOLN
0.0000 ug/min | INTRAVENOUS | Status: DC
  Administered 2011-08-18: 5 ug/min via INTRAVENOUS

## 2011-08-18 MED ORDER — METOPROLOL TARTRATE 12.5 MG HALF TABLET
12.5000 mg | ORAL_TABLET | Freq: Two times a day (BID) | ORAL | Status: DC
  Administered 2011-08-19 – 2011-08-20 (×4): 12.5 mg via ORAL
  Filled 2011-08-18 (×7): qty 1

## 2011-08-18 MED ORDER — SODIUM CHLORIDE 0.9 % IV SOLN
INTRAVENOUS | Status: DC
  Administered 2011-08-18: 20 mL/h via INTRAVENOUS

## 2011-08-18 MED ORDER — SODIUM CHLORIDE 0.9 % IV SOLN
INTRAVENOUS | Status: DC
  Administered 2011-08-18: 0.9 [IU]/h via INTRAVENOUS
  Filled 2011-08-18: qty 1

## 2011-08-18 MED ORDER — METOPROLOL TARTRATE 1 MG/ML IV SOLN: 2.5000 mg | INTRAVENOUS | Status: DC | PRN

## 2011-08-18 MED ORDER — FENTANYL CITRATE 0.05 MG/ML IJ SOLN
INTRAMUSCULAR | Status: DC | PRN
  Administered 2011-08-18: 250 ug via INTRAVENOUS
  Administered 2011-08-18: 100 ug via INTRAVENOUS
  Administered 2011-08-18: 250 ug via INTRAVENOUS
  Administered 2011-08-18: 100 ug via INTRAVENOUS
  Administered 2011-08-18 (×3): 150 ug via INTRAVENOUS
  Administered 2011-08-18: 100 ug via INTRAVENOUS
  Administered 2011-08-18: 250 ug via INTRAVENOUS
  Administered 2011-08-18 (×2): 100 ug via INTRAVENOUS
  Administered 2011-08-18: 50 ug via INTRAVENOUS

## 2011-08-18 MED ORDER — ATORVASTATIN CALCIUM 20 MG PO TABS
20.0000 mg | ORAL_TABLET | Freq: Every day | ORAL | Status: DC
  Administered 2011-08-19 – 2011-08-24 (×6): 20 mg via ORAL
  Filled 2011-08-18 (×7): qty 1

## 2011-08-18 MED ORDER — METOPROLOL TARTRATE 25 MG/10 ML ORAL SUSPENSION
12.5000 mg | Freq: Two times a day (BID) | ORAL | Status: DC
  Filled 2011-08-18 (×5): qty 5

## 2011-08-18 MED ORDER — BISACODYL 5 MG PO TBEC
10.0000 mg | DELAYED_RELEASE_TABLET | Freq: Every day | ORAL | Status: DC
  Administered 2011-08-19 – 2011-08-22 (×4): 10 mg via ORAL
  Filled 2011-08-18 (×5): qty 2

## 2011-08-18 MED ORDER — MIDAZOLAM HCL 5 MG/5ML IJ SOLN
INTRAMUSCULAR | Status: DC | PRN
  Administered 2011-08-18: 4 mg via INTRAVENOUS
  Administered 2011-08-18: 2 mg via INTRAVENOUS
  Administered 2011-08-18: 4 mg via INTRAVENOUS
  Administered 2011-08-18: 1 mg via INTRAVENOUS
  Administered 2011-08-18: 2 mg via INTRAVENOUS
  Administered 2011-08-18: 1 mg via INTRAVENOUS
  Administered 2011-08-18 (×2): 2 mg via INTRAVENOUS

## 2011-08-18 MED ORDER — LIDOCAINE HCL (CARDIAC) 20 MG/ML IV SOLN
INTRAVENOUS | Status: DC | PRN
  Administered 2011-08-18: 80 mg via INTRAVENOUS

## 2011-08-18 MED ORDER — PROPOFOL 10 MG/ML IV EMUL
INTRAVENOUS | Status: DC | PRN
  Administered 2011-08-18: 110 mg via INTRAVENOUS

## 2011-08-18 MED ORDER — TAMOXIFEN CITRATE 10 MG PO TABS
20.0000 mg | ORAL_TABLET | Freq: Every day | ORAL | Status: DC
  Filled 2011-08-18: qty 2

## 2011-08-18 MED ORDER — BISACODYL 10 MG RE SUPP
10.0000 mg | Freq: Every day | RECTAL | Status: DC
  Administered 2011-08-25: 10 mg via RECTAL
  Filled 2011-08-18: qty 1

## 2011-08-18 MED ORDER — THROMBIN 20000 UNITS EX SOLR
CUTANEOUS | Status: AC
  Filled 2011-08-18: qty 20000

## 2011-08-18 MED ORDER — SODIUM CHLORIDE 0.9 % IV SOLN: 250.0000 mL | INTRAVENOUS | Status: DC

## 2011-08-18 MED ORDER — PROTAMINE SULFATE 10 MG/ML IV SOLN
INTRAVENOUS | Status: DC | PRN
  Administered 2011-08-18 (×2): 40 mg via INTRAVENOUS
  Administered 2011-08-18: 50 mg via INTRAVENOUS
  Administered 2011-08-18: 40 mg via INTRAVENOUS
  Administered 2011-08-18: 20 mg via INTRAVENOUS

## 2011-08-18 MED ORDER — ACETAMINOPHEN 650 MG RE SUPP
650.0000 mg | RECTAL | Status: AC
  Administered 2011-08-18: 650 mg via RECTAL

## 2011-08-18 MED ORDER — POTASSIUM CHLORIDE 10 MEQ/50ML IV SOLN: 10.0000 meq | INTRAVENOUS | Status: AC

## 2011-08-18 MED ORDER — SODIUM CHLORIDE 0.9 % IJ SOLN
3.0000 mL | Freq: Two times a day (BID) | INTRAMUSCULAR | Status: DC
  Administered 2011-08-19 (×3): 3 mL via INTRAVENOUS

## 2011-08-18 MED ORDER — ATORVASTATIN CALCIUM 20 MG PO TABS
20.0000 mg | ORAL_TABLET | Freq: Every day | ORAL | Status: DC
  Filled 2011-08-18: qty 1

## 2011-08-18 MED ORDER — LACTATED RINGERS IV SOLN
INTRAVENOUS | Status: DC
  Administered 2011-08-18: 20 mL/h via INTRAVENOUS

## 2011-08-18 MED ORDER — DEXMEDETOMIDINE HCL IN NACL 200 MCG/50ML IV SOLN
0.1000 ug/kg/h | INTRAVENOUS | Status: DC
  Filled 2011-08-18: qty 50

## 2011-08-18 MED ORDER — PHENYLEPHRINE HCL 10 MG/ML IJ SOLN
INTRAMUSCULAR | Status: DC | PRN
  Administered 2011-08-18 (×3): 40 ug via INTRAVENOUS

## 2011-08-18 MED ORDER — LACTATED RINGERS IV SOLN: 500.0000 mL | Freq: Once | INTRAVENOUS | Status: AC | PRN

## 2011-08-18 MED ORDER — TRANEXAMIC ACID 100 MG/ML IV SOLN
2500.0000 mg | INTRAVENOUS | Status: DC | PRN
  Administered 2011-08-18: 1.5 mg/kg/h via INTRAVENOUS

## 2011-08-18 MED ORDER — DEXTROSE 5 % IV SOLN
1.5000 g | Freq: Two times a day (BID) | INTRAVENOUS | Status: DC
  Administered 2011-08-18 – 2011-08-20 (×4): 1.5 g via INTRAVENOUS
  Filled 2011-08-18 (×4): qty 1.5

## 2011-08-18 MED ORDER — MIDAZOLAM HCL 2 MG/2ML IJ SOLN: 2.0000 mg | INTRAMUSCULAR | Status: DC | PRN

## 2011-08-18 MED ORDER — DOCUSATE SODIUM 100 MG PO CAPS
200.0000 mg | ORAL_CAPSULE | Freq: Every day | ORAL | Status: DC
  Administered 2011-08-19 – 2011-08-25 (×7): 200 mg via ORAL
  Filled 2011-08-18 (×7): qty 2

## 2011-08-18 MED ORDER — PANTOPRAZOLE SODIUM 40 MG PO TBEC
40.0000 mg | DELAYED_RELEASE_TABLET | Freq: Every day | ORAL | Status: DC
  Administered 2011-08-20 – 2011-08-25 (×6): 40 mg via ORAL
  Filled 2011-08-18 (×6): qty 1

## 2011-08-18 MED ORDER — METOPROLOL TARTRATE 12.5 MG HALF TABLET
12.5000 mg | ORAL_TABLET | Freq: Two times a day (BID) | ORAL | Status: DC
  Filled 2011-08-18: qty 1

## 2011-08-18 SURGICAL SUPPLY — 103 items
APL SKNCLS STERI-STRIP NONHPOA (GAUZE/BANDAGES/DRESSINGS) ×1
ATTRACTOMAT 16X20 MAGNETIC DRP (DRAPES) ×2 IMPLANT
BAG DECANTER FOR FLEXI CONT (MISCELLANEOUS) ×2 IMPLANT
BANDAGE ELASTIC 4 VELCRO ST LF (GAUZE/BANDAGES/DRESSINGS) ×2 IMPLANT
BANDAGE ELASTIC 6 VELCRO ST LF (GAUZE/BANDAGES/DRESSINGS) ×2 IMPLANT
BANDAGE GAUZE ELAST BULKY 4 IN (GAUZE/BANDAGES/DRESSINGS) ×2 IMPLANT
BASKET HEART (ORDER IN 25'S) (MISCELLANEOUS) ×1
BASKET HEART (ORDER IN 25S) (MISCELLANEOUS) ×1 IMPLANT
BENZOIN TINCTURE PRP APPL 2/3 (GAUZE/BANDAGES/DRESSINGS) ×1 IMPLANT
BLADE STERNUM SYSTEM 6 (BLADE) ×2 IMPLANT
BLADE SURG 11 STRL SS (BLADE) ×1 IMPLANT
CANISTER SUCTION 2500CC (MISCELLANEOUS) ×2 IMPLANT
CATH ROBINSON RED A/P 18FR (CATHETERS) ×4 IMPLANT
CATH THORACIC 28FR (CATHETERS) ×2 IMPLANT
CATH THORACIC 28FR RT ANG (CATHETERS) IMPLANT
CATH THORACIC 36FR (CATHETERS) ×2 IMPLANT
CATH THORACIC 36FR RT ANG (CATHETERS) ×2 IMPLANT
CLIP FOGARTY SPRING 6M (CLIP) ×1 IMPLANT
CLIP TI MEDIUM 24 (CLIP) IMPLANT
CLIP TI WIDE RED SMALL 24 (CLIP) ×1 IMPLANT
CLOTH BEACON ORANGE TIMEOUT ST (SAFETY) ×2 IMPLANT
COVER SURGICAL LIGHT HANDLE (MISCELLANEOUS) ×4 IMPLANT
CRADLE DONUT ADULT HEAD (MISCELLANEOUS) ×2 IMPLANT
DRAPE CARDIOVASCULAR INCISE (DRAPES) ×2
DRAPE SLUSH MACHINE 52X66 (DRAPES) IMPLANT
DRAPE SLUSH/WARMER DISC (DRAPES) IMPLANT
DRAPE SRG 135X102X78XABS (DRAPES) ×1 IMPLANT
DRSG COVADERM 4X14 (GAUZE/BANDAGES/DRESSINGS) ×2 IMPLANT
ELECT CAUTERY BLADE 6.4 (BLADE) ×2 IMPLANT
ELECT REM PT RETURN 9FT ADLT (ELECTROSURGICAL) ×4
ELECTRODE REM PT RTRN 9FT ADLT (ELECTROSURGICAL) ×2 IMPLANT
GLOVE BIO SURGEON STRL SZ 6 (GLOVE) ×4 IMPLANT
GLOVE BIO SURGEON STRL SZ 6.5 (GLOVE) ×4 IMPLANT
GLOVE BIO SURGEON STRL SZ7 (GLOVE) IMPLANT
GLOVE BIO SURGEON STRL SZ7.5 (GLOVE) IMPLANT
GLOVE BIOGEL PI IND STRL 6 (GLOVE) IMPLANT
GLOVE BIOGEL PI IND STRL 6.5 (GLOVE) IMPLANT
GLOVE BIOGEL PI IND STRL 7.0 (GLOVE) IMPLANT
GLOVE BIOGEL PI INDICATOR 6 (GLOVE)
GLOVE BIOGEL PI INDICATOR 6.5 (GLOVE)
GLOVE BIOGEL PI INDICATOR 7.0 (GLOVE) ×5
GLOVE EUDERMIC 7 POWDERFREE (GLOVE) ×4 IMPLANT
GLOVE EXAM NITRILE XS STR PU (GLOVE) ×1 IMPLANT
GLOVE ORTHO TXT STRL SZ7.5 (GLOVE) IMPLANT
GOWN PREVENTION PLUS XLARGE (GOWN DISPOSABLE) ×2 IMPLANT
GOWN STRL NON-REIN LRG LVL3 (GOWN DISPOSABLE) ×10 IMPLANT
HEMOSTAT POWDER SURGIFOAM 1G (HEMOSTASIS) ×6 IMPLANT
HEMOSTAT SURGICEL 2X14 (HEMOSTASIS) ×2 IMPLANT
INSERT FOGARTY 61MM (MISCELLANEOUS) IMPLANT
INSERT FOGARTY XLG (MISCELLANEOUS) IMPLANT
KIT BASIN OR (CUSTOM PROCEDURE TRAY) ×2 IMPLANT
KIT CATH CPB BARTLE (MISCELLANEOUS) ×2 IMPLANT
KIT ROOM TURNOVER OR (KITS) ×2 IMPLANT
KIT SUCTION CATH 14FR (SUCTIONS) ×2 IMPLANT
KIT VASOVIEW W/TROCAR VH 2000 (KITS) ×2 IMPLANT
LINE VENT (MISCELLANEOUS) ×1 IMPLANT
NS IRRIG 1000ML POUR BTL (IV SOLUTION) ×12 IMPLANT
PACK OPEN HEART (CUSTOM PROCEDURE TRAY) ×2 IMPLANT
PAD ARMBOARD 7.5X6 YLW CONV (MISCELLANEOUS) ×4 IMPLANT
PENCIL BUTTON HOLSTER BLD 10FT (ELECTRODE) ×2 IMPLANT
PUNCH AORTIC ROTATE 4.0MM (MISCELLANEOUS) IMPLANT
PUNCH AORTIC ROTATE 4.5MM 8IN (MISCELLANEOUS) ×2 IMPLANT
PUNCH AORTIC ROTATE 5MM 8IN (MISCELLANEOUS) IMPLANT
SPONGE GAUZE 4X4 12PLY (GAUZE/BANDAGES/DRESSINGS) ×4 IMPLANT
SPONGE INTESTINAL PEANUT (DISPOSABLE) IMPLANT
SPONGE LAP 18X18 X RAY DECT (DISPOSABLE) ×1 IMPLANT
SPONGE LAP 4X18 X RAY DECT (DISPOSABLE) ×2 IMPLANT
STRIP CLOSURE SKIN 1/2X4 (GAUZE/BANDAGES/DRESSINGS) ×1 IMPLANT
SUT BONE WAX W31G (SUTURE) ×2 IMPLANT
SUT MNCRL AB 4-0 PS2 18 (SUTURE) ×1 IMPLANT
SUT PROLENE 3 0 SH DA (SUTURE) IMPLANT
SUT PROLENE 3 0 SH1 36 (SUTURE) ×2 IMPLANT
SUT PROLENE 4 0 RB 1 (SUTURE)
SUT PROLENE 4 0 SH DA (SUTURE) IMPLANT
SUT PROLENE 4-0 RB1 .5 CRCL 36 (SUTURE) IMPLANT
SUT PROLENE 5 0 C 1 36 (SUTURE) IMPLANT
SUT PROLENE 6 0 C 1 30 (SUTURE) ×2 IMPLANT
SUT PROLENE 7 0 BV 1 (SUTURE) ×2 IMPLANT
SUT PROLENE 7 0 BV1 MDA (SUTURE) ×2 IMPLANT
SUT PROLENE 8 0 BV175 6 (SUTURE) IMPLANT
SUT SILK  1 MH (SUTURE)
SUT SILK 1 MH (SUTURE) IMPLANT
SUT STEEL STERNAL CCS#1 18IN (SUTURE) IMPLANT
SUT STEEL SZ 6 DBL 3X14 BALL (SUTURE) IMPLANT
SUT VIC AB 1 CTX 36 (SUTURE) ×6
SUT VIC AB 1 CTX36XBRD ANBCTR (SUTURE) ×2 IMPLANT
SUT VIC AB 2-0 CT1 27 (SUTURE) ×2
SUT VIC AB 2-0 CT1 TAPERPNT 27 (SUTURE) IMPLANT
SUT VIC AB 2-0 CTX 27 (SUTURE) IMPLANT
SUT VIC AB 3-0 SH 27 (SUTURE)
SUT VIC AB 3-0 SH 27X BRD (SUTURE) IMPLANT
SUT VIC AB 3-0 X1 27 (SUTURE) IMPLANT
SUT VICRYL 4-0 PS2 18IN ABS (SUTURE) IMPLANT
SUTURE E-PAK OPEN HEART (SUTURE) ×2 IMPLANT
SYSTEM SAHARA CHEST DRAIN ATS (WOUND CARE) ×2 IMPLANT
TAPE CLOTH SURG 4X10 WHT LF (GAUZE/BANDAGES/DRESSINGS) ×2 IMPLANT
TOWEL OR 17X24 6PK STRL BLUE (TOWEL DISPOSABLE) ×2 IMPLANT
TOWEL OR 17X26 10 PK STRL BLUE (TOWEL DISPOSABLE) ×2 IMPLANT
TRAY FOLEY IC TEMP SENS 14FR (CATHETERS) ×2 IMPLANT
TUBE SUCT INTRACARD DLP 20F (MISCELLANEOUS) ×2 IMPLANT
TUBING INSUFFLATION 10FT LAP (TUBING) ×2 IMPLANT
UNDERPAD 30X30 INCONTINENT (UNDERPADS AND DIAPERS) ×2 IMPLANT
WATER STERILE IRR 1000ML POUR (IV SOLUTION) ×4 IMPLANT

## 2011-08-18 NOTE — Brief Op Note (Signed)
08/18/2011  11:36 AM  PATIENT:  [name] [name]  66 y.o. female  PRE-OPERATIVE DIAGNOSIS:  CAD  POST-OPERATIVE DIAGNOSIS:  Coronary Artery disease  PROCEDURE:  Procedure(s): CORONARY ARTERY BYPASS GRAFTING x 5  (LIMA-LAD, SVG-D, SVG-OM1-OM2, SVG-PD), EVH right leg  SURGEON:  Surgeon(s): Alleen Borne, MD  ASSISTANT: Coral Ceo, PA-C  ANESTHESIA:   general  PATIENT CONDITION:  ICU - intubated and hemodynamically stable.  PRE-OPERATIVE WEIGHT: 61.2 kg

## 2011-08-18 NOTE — Procedures (Signed)
Extubation Procedure Note  Patient Details:   Name: [name] [name] DOB: 27-Jul-1945 MRN: [id number]   Airway Documentation:     Evaluation  O2 sats: stable throughout Complications: No apparent complications Patient did tolerate procedure well. Bilateral Breath Sounds: Clear Suctioning: Airway Yes Extubated patient per SICU wean protocol.  Patient had a  vital capacity of 1.2 L and Negative Inspiratory Force of -20.  Suctioned mouth and ett tube prior to procedure.  Patient had a positive cuff leak.  RT extubated patient and placed on a 4L [place]s sats are 99%.  Patient tolerated well.  Was able to state name after procedure.  BBS were diminished.  No stridor noted at this time.  RT will continue to monitor patient.  Gabriela Eves 08/18/2011, 6:34 PM

## 2011-08-18 NOTE — Transfer of Care (Signed)
Immediate Anesthesia Transfer of Care Note  Patient: [name] [name]  Procedure(s) Performed: Procedure(s) (LRB): CORONARY ARTERY BYPASS GRAFTING (CABG) (N/A)  Patient Location: SICU  Anesthesia Type: General  Level of Consciousness: Patient remains intubated per anesthesia plan  Airway & Oxygen Therapy: Patient remains intubated per anesthesia plan and Patient placed on Ventilator (see vital sign flow sheet for setting)  Post-op Assessment: Report given to PACU RN and Post -op Vital signs reviewed and stable  Post vital signs: Reviewed and stable  Complications: No apparent anesthesia complications

## 2011-08-18 NOTE — Interval H&P Note (Signed)
History and Physical Interval Note:  08/18/2011 7:44 AM  [name] [name]  has presented today for surgery, with the diagnosis of CAD  The various methods of treatment have been discussed with the patient and family. After consideration of risks, benefits and other options for treatment, the patient has consented to  Procedure(s) (LRB): CORONARY ARTERY BYPASS GRAFTING (CABG) (N/A) as a surgical intervention .  [name] history has been reviewed, patient examined, no change in status, stable for surgery.  I have reviewed the patients' chart and labs.  Questions were answered to the patient's satisfaction.     Alleen Borne

## 2011-08-18 NOTE — OR Nursing (Signed)
Chest incision at [phone]

## 2011-08-18 NOTE — Anesthesia Procedure Notes (Addendum)
Performed by: Arlice Colt B   Procedure Name: Intubation Date/Time: 08/18/2011 8:42 AM Performed by: Arlice Colt B Pre-anesthesia Checklist: Patient identified, Emergency Drugs available, Suction available, Patient being monitored and Timeout performed Patient Re-evaluated:Patient Re-evaluated prior to inductionOxygen Delivery Method: Circle system utilized Preoxygenation: Pre-oxygenation with 100% oxygen Intubation Type: IV induction Ventilation: Mask ventilation without difficulty Laryngoscope Size: Mac and 3 Grade View: Grade I Tube type: Oral Tube size: 8.0 mm Number of attempts: 1 Airway Equipment and Method: Stylet Placement Confirmation: ETT inserted through vocal cords under direct vision,  positive ETCO2 and breath sounds checked- equal and bilateral Secured at: 22 cm Tube secured with: Tape Dental Injury: Teeth and Oropharynx as per pre-operative assessment

## 2011-08-18 NOTE — Anesthesia Preprocedure Evaluation (Addendum)
Anesthesia Evaluation  Patient identified by MRN, date of birth, ID band Patient awake    Reviewed: Allergy & Precautions, H&P , NPO status , Patient's Chart, lab work & pertinent test results  Airway Mallampati: II      Dental   Pulmonary shortness of breath and with exertion,  breath sounds clear to auscultation        Cardiovascular + angina + CAD - Valvular Problems/MurmursRhythm:Regular Rate:Normal     Neuro/Psych negative neurological ROS     GI/Hepatic negative GI ROS, Neg liver ROS,   Endo/Other  negative endocrine ROS  Renal/GU negative Renal ROS     Musculoskeletal   Abdominal   Peds  Hematology   Anesthesia Other Findings   Reproductive/Obstetrics                         Anesthesia Physical Anesthesia Plan  ASA: IV  Anesthesia Plan: General   Post-op Pain Management:    Induction: Intravenous  Airway Management Planned: Oral ETT  Additional Equipment: Ultrasound Guidance Line Placement, PA Cath and Arterial line  Intra-op Plan:   Post-operative Plan: Post-operative intubation/ventilation  Informed Consent: I have reviewed the patients History and Physical, chart, labs and discussed the procedure including the risks, benefits and alternatives for the proposed anesthesia with the patient or authorized representative who has indicated his/her understanding and acceptance.     Plan Discussed with: CRNA and Surgeon  Anesthesia Plan Comments:       Anesthesia Quick Evaluation

## 2011-08-18 NOTE — Progress Notes (Addendum)
Patient received from OR team and placed on ventilator per Surgical Wean Protocol.  Per CRNA patient has no pulmonary history other than being and ex smoker.  Quit in 2003 per H&P note.  RT will continue to monitor patient for readiness to wean.

## 2011-08-18 NOTE — Progress Notes (Signed)
Doing well after CABG Extubated on 2L Stable hemodynamics Pm labs reviewed

## 2011-08-18 NOTE — H&P (Signed)
[address]            [name] [name] [address]          [phone]       PCP is [name] Jumbo, MD Referring Provider is [name] [name], [name] Ping, MD    Chief Complaint   Patient presents with   .  Coronary Artery Disease       Referral from Dr [name] [name] for eval on CAD, Cardiac Cath on 08/12/11        HPI:   The patient is a 66 year old woman with no prior history of cardiac disease who began having exercise-induced chest pressure radiating into both arms with aching in her elbows and numbness in her hands since March of 2013. [name] also had exertional shortness of breath. Her symptoms always resolved with rest. She had a Myoview examination showing inferior wall ischemia. Cardiac catheterization yesterday showed 60-70% stenosis at the origin of the LAD. There is a large first diagonal branch that had at least 50% proximal stenosis. The left circumflex gave off 2 marginal branches. The first appeared to have about 50% ostial stenosis. There is 60% distal left circumflex stenosis before the second marginal branch. The right coronary was chronically occluded proximally with filling of the distal vessel by collaterals from the left.    Past Medical History   Diagnosis  Date   .  Breast cancer     .  Interstitial cystitis     .  Hyperlipidemia     .  Frequent UTI         on prophylaxis   .  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  Breast lumpectomy: followed by XRT and Tamoxifen  (Left Breast)  04/2009       left   .  Partial hysterectomy  1976       vaginal bleeding after 2nd child born   .  Appendectomy  1976         Family History   Problem  Relation  Age of Onset   .  Cancer  Mother  53       breast cancer   .  Heart disease  Father  50       MI at 79, CABG in 78's   .  Hepatitis  Father         C from blood transfusion   .  Heart disease  Brother         CABG in 65's   .  Heart disease  Paternal Aunt      .  Heart disease  Paternal Uncle     .  Heart disease  Paternal Grandfather     .  Diabetes  Neg Hx          Social History History   Substance Use Topics   .  Smoking status:  Former Smoker       Quit date:  02/15/2001   .  Smokeless tobacco:  Never Used   .  Alcohol Use:  Yes         1 glass of wine per day.         Current Outpatient Prescriptions   Medication  Sig  Dispense  Refill   .  aspirin 81 MG tablet  Take 81 mg by mouth daily.         [name]  atorvastatin (LIPITOR) 20 MG tablet  Take 1 tablet (20 mg total) by mouth daily.   90 tablet   3   .  EPINEPHrine (EPIPEN) 0.3 mg/0.3 mL DEVI  Inject 0.3 mLs (0.3 mg total) into the muscle once.   1 Device   0   .  metoprolol succinate (TOPROL-XL) 25 MG 24 hr tablet  Take 50 mg by mouth daily.         .  nitrofurantoin (MACRODANTIN) 100 MG capsule  Take 100 mg by mouth daily.          .  nitroGLYCERIN (NITROSTAT) 0.4 MG SL tablet  Place 1 tablet (0.4 mg total) under the tongue every 5 (five) minutes as needed for chest pain.   25 tablet   3   .  oxybutynin (DITROPAN-XL) 10 MG 24 hr tablet  Take 10 mg by mouth daily.         .  tamoxifen (NOLVADEX) 20 MG tablet  Take 20 mg by mouth daily.         [name]  DISCONTD: metoprolol succinate (TOPROL XL) 25 MG 24 hr tablet  Take 1 tablet (25 mg total) by mouth daily.   90 tablet   1       No current facility-administered medications for this visit.       Facility-Administered Medications Ordered in Other Visits   Medication  Dose  Route  Frequency  Provider  Last Rate  Last Dose   .  DISCONTD: 0.9 %  sodium chloride infusion     Intravenous  Continuous  [name] Mixer, MD  150 mL/hr at 08/12/11 0710      .  DISCONTD: acetaminophen (TYLENOL) tablet 650 mg   650 mg  Oral  Q4H PRN  [name] Mixer, MD         .  DISCONTD: ondansetron [name] [name]) injection 4 mg   4 mg  Intravenous  Q6H PRN  [name] Mixer, MD               Allergies   Allergen  Reactions   .  Codeine  Nausea And Vomiting            .  Phenothiazines  Other (See Comments)       Makes her stop breathing.   .  Talwin (Pentazocine)  Nausea And Vomiting   .  Yellow Jacket Venom          Review of Systems  Constitutional: Positive for fatigue.  HENT: Negative.   Eyes: Negative.   Respiratory: Positive for shortness of breath.   Cardiovascular: Positive for chest pain.  Gastrointestinal: Negative.   Genitourinary:        Interstitial cystitis for years managed by Dr. Patsi [name]  Musculoskeletal: Negative.   Neurological: Negative.   Hematological: Negative.   Psychiatric/Behavioral: Negative.       BP 144/84  Pulse 74  Resp 20  Ht 5\' 6"  (1.676 m)  Wt 136 lb (61.689 kg)  BMI 21.95 kg/m2  SpO2 99% Physical Exam  Constitutional: She is oriented to person, place, and time. She appears well-developed and well-nourished.  HENT:   Head: Normocephalic and atraumatic.   Mouth/Throat: Oropharynx is clear and moist.  Eyes: Conjunctivae and EOM are normal. Pupils are equal, round, and reactive to light.  Neck: Normal range of motion. Neck supple. No JVD present. No tracheal deviation present.  No thyromegaly present.       Scar at base of left neck from infection after pneumonia at age 28  Cardiovascular: Normal rate, regular rhythm, normal heart sounds and intact distal pulses.  Exam reveals no gallop and no friction rub.    No murmur heard. Pulmonary/Chest: Effort normal and breath sounds normal. No respiratory distress.  Abdominal: Soft. Bowel sounds are normal. She exhibits no distension and no mass. There is no tenderness.  Musculoskeletal: She exhibits no edema.  Lymphadenopathy:    She has no cervical adenopathy.  Neurological: She is alert and oriented to person, place, and time. She has normal strength. No cranial nerve deficit or sensory deficit.  Skin: Skin is warm and dry.  Psychiatric: She has a normal mood and affect.        Diagnostic Tests: Cardiac Cath   Hemodynamics:      LV pressure: 130/7 Aortic pressure: 126/64   Angiography     Left Main: mildly calcified, no significant stenosis   Left anterior Descending: heavily calcified.  There is a 60-70% stenosis at the origen of the LAD.  There is diffuse moderate disease in the rest of the LAD.  The large first diagonal has moderate 50% stenosis.   Left Circumflex: 50% stenosis prox.  There is a distal 60% stenosis.  The vessel is mildly calcified.   Right Coronary Artery: Chronic total occlusion proximally.  There is left to right collateral filling of the distal RCA.   LV Gram: normal / vigorous LV function.  EF 65-70%   Complications: No apparent complications Patient did tolerate procedure well.   Conclusions:   1. Severe CAD involving primarily the LAD and RCA with moderate disease in the LCx. 2. Well preserved LV function.   She has significant angina despite medical therapy.  We will refer to [facility] for CABG for control of angina symptoms.     Impression:   She has severe coronary disease involving the LAD and right coronary territories with moderate disease of the left circumflex and normal left ventricular function. This is causing lifestyle limiting angina and I agree that coronary bypass graft surgery is the best treatment to prevent further ischemia and infarction, and improve her quality of life.  I discussed the operative procedure with the patient and family including alternatives, benefits and risks; including but not limited to bleeding, blood transfusion, infection, stroke, myocardial infarction, graft failure, heart block requiring a permanent pacemaker, organ dysfunction, and death.  [name] [name] understands and agrees to proceed.     Plan:   CABG surgery on Wednesday 08/18/2011.

## 2011-08-18 NOTE — Anesthesia Postprocedure Evaluation (Signed)
  Anesthesia Post-op Note  Patient: [name] [name]  Procedure(s) Performed: Procedure(s) (LRB): CORONARY ARTERY BYPASS GRAFTING (CABG) (N/A)  Patient Location: PACU and SICU  Anesthesia Type: General  Level of Consciousness: Patient remains intubated per anesthesia plan  Airway and Oxygen Therapy: Patient remains intubated per anesthesia plan  Post-op Pain: mild  Post-op Assessment: Post-op Vital signs reviewed  Post-op Vital Signs: Reviewed  Complications: No apparent anesthesia complications

## 2011-08-18 NOTE — Plan of Care (Signed)
Problem: Phase II Progression Outcomes Goal: Patient extubated within - Outcome: Completed/Met Date Met:  08/18/11 Extubated within 6hrs

## 2011-08-19 ENCOUNTER — Inpatient Hospital Stay (HOSPITAL_COMMUNITY): Payer: BC Managed Care – PPO

## 2011-08-19 DIAGNOSIS — I2510 Atherosclerotic heart disease of native coronary artery without angina pectoris: Secondary | ICD-10-CM

## 2011-08-19 LAB — GLUCOSE, CAPILLARY
Glucose-Capillary: 116 mg/dL — ABNORMAL HIGH (ref 70–99)
Glucose-Capillary: 72 mg/dL (ref 70–99)
Glucose-Capillary: 82 mg/dL (ref 70–99)
Glucose-Capillary: 94 mg/dL (ref 70–99)

## 2011-08-19 LAB — BASIC METABOLIC PANEL
BUN: 13 mg/dL (ref 6–23)
Chloride: 102 mEq/L (ref 96–112)
GFR calc Af Amer: >90 mL/min (ref >90)
Glucose, Bld: 108 mg/dL — ABNORMAL HIGH (ref 70–99)
Potassium: 3.9 mEq/L (ref 3.5–5.1)
Sodium: 136 mEq/L (ref 135–145)

## 2011-08-19 LAB — CBC
HCT: 26.1 % — ABNORMAL LOW (ref 36.0–46.0)
HCT: 28.4 % — ABNORMAL LOW (ref 36.0–46.0)
Hemoglobin: 8.7 g/dL — ABNORMAL LOW (ref 12.0–15.0)
Hemoglobin: 9.3 g/dL — ABNORMAL LOW (ref 12.0–15.0)
MCV: 92.8 fL (ref 78.0–100.0)
RBC: 3.06 MIL/uL — ABNORMAL LOW (ref 3.87–5.11)
RDW: 13.5 % (ref 11.5–15.5)
WBC: 9.1 10*3/uL (ref 4.0–10.5)
WBC: 9 10*3/uL (ref 4.0–10.5)

## 2011-08-19 LAB — CREATININE, SERUM
GFR calc Af Amer: >90 mL/min (ref >90)
GFR calc non Af Amer: >90 mL/min (ref >90)

## 2011-08-19 LAB — POCT I-STAT, CHEM 8
Calcium, Ion: 1.11 mmol/L — ABNORMAL LOW (ref 1.13–1.30)
Chloride: 103 mEq/L (ref 96–112)
Glucose, Bld: 117 mg/dL — ABNORMAL HIGH (ref 70–99)
HCT: 27 % — ABNORMAL LOW (ref 36.0–46.0)
TCO2: 21 mmol/L (ref 0–100)

## 2011-08-19 LAB — MAGNESIUM: Magnesium: 2.4 mg/dL (ref 1.5–2.5)

## 2011-08-19 MED ORDER — INSULIN ASPART 100 UNIT/ML ~~LOC~~ SOLN
0.0000 [IU] | SUBCUTANEOUS | Status: DC
  Administered 2011-08-19 – 2011-08-20 (×2): 2 [IU] via SUBCUTANEOUS

## 2011-08-19 MED ORDER — INSULIN ASPART 100 UNIT/ML ~~LOC~~ SOLN: 0.0000 [IU] | SUBCUTANEOUS | Status: AC

## 2011-08-19 MED ORDER — INSULIN ASPART 100 UNIT/ML ~~LOC~~ SOLN
0.0000 [IU] | SUBCUTANEOUS | Status: DC
  Administered 2011-08-19: 2 [IU] via SUBCUTANEOUS

## 2011-08-19 NOTE — Op Note (Signed)
NAME:  [name] [name], [name] [name] NO.:  [id number]  MEDICAL RECORD NO.:  [id number]  LOCATION:  2301                         FACILITY:  [facility]  PHYSICIAN:  Evelene Croon, M.D.     DATE OF BIRTH:  1945/04/19  DATE OF PROCEDURE:  08/18/2011 DATE OF DISCHARGE:                              OPERATIVE REPORT   PREOPERATIVE DIAGNOSIS:  Severe multivessel coronary artery disease with exertional angina.  POSTOPERATIVE DIAGNOSIS:  Severe multivessel coronary artery disease with exertional angina.  OPERATIVE PROCEDURE:  Median sternotomy, extracorporeal circulation, coronary artery bypass graft surgery x5 using a left internal mammary artery graft to the left anterior descending coronary artery with a saphenous vein graft to the diagonal branch of the left anterior descending artery, a sequential saphenous vein graft to the first and second obtuse marginal branch of the left circumflex coronary artery, and a saphenous vein graft to the posterior descending branch of the right coronary artery.  Endoscopic vein harvesting from the right leg.  ATTENDING SURGEON:  Evelene Croon, MD  ASSISTANT:  Coral Ceo, PA-C  ANESTHESIA:  General endotracheal.  CLINICAL HISTORY:  This patient is a 66 year old woman with no prior cardiac disease, who began having exercise-induced chest pressure radiating to both arms with aching in her elbows and numbness in her hands since March 2013.  She has also had some exertional shortness of breath.  Her symptoms always resolved with rest.  A Myoview examination showed inferior wall ischemia.  Cardiac catheterization showed a 60-70% stenosis at the origin of the LAD.  There was a large first diagonal that had at least 50% ostial and proximal stenosis.  The left circumflex gave off two marginal branches.  The first appeared to have about 50% ostial stenosis.  There was a 60% distal left circumflex stenosis before the second marginal branch.  The right  coronary artery was chronically occluded proximally with filling of the distal vessel by collaterals from the left.  After review of the catheterization and examination of the patient, it was felt that coronary artery bypass graft surgery is the best treatment to prevent further ischemia and infarction, and improve her quality of life.  I discussed the operative procedure with the patient and her husband including alternatives, benefits, and risks including, but not limited to, bleeding, blood transfusion, infection, stroke, myocardial infarction, graft failure, and death.  They understood and agreed to proceed.  OPERATIVE PROCEDURE:  The patient was taken to the operating room and placed on the table in supine position.  After induction of general endotracheal anesthesia, a Foley catheter was placed in the bladder using sterile technique.  Then, the chest, abdomen, and both lower extremities were prepped and draped in usual sterile manner.  The chest was entered through a median sternotomy incision and the pericardium opened in the midline.  Examination of the heart showed good ventricular contractility.  The ascending aorta had no palpable plaques in it.  Then, the left internal mammary artery was harvested from the chest wall as a pedicle graft.  This was a medium caliber vessel with excellent blood flow through it.  The vessel wall was soft and there was no sign of any radiation changes related to her previous breast radiation.  At the same time, segment of greater saphenous vein was harvested from the right leg using endoscopic vein harvest technique.  This vein was of medium size and good quality.  Then, the patient was heparinized.  When an adequate ACT was obtained, the distal ascending aorta was cannulated using a 20-French aortic cannula for arterial inflow.  Venous outflow was achieved using a two- stage venous cannula for the right atrial appendage.  An  antegrade cardioplegia and vent cannula was inserted in the aortic root.  The patient was placed on cardiopulmonary bypass and distal coronary artery was identified.  The LAD was a large graftable vessel that was diseased throughout its proximal and midportions.  Diagonal branch was a large graftable vessel that was heavily diseased proximally.  The two marginal branches both moderate-sized vessel with no significant distal disease in them.  The right coronary artery was diffusely diseased and this was extended out to the takeoff of the posterior descending branch. The posterior descending itself was a medium-sized vessel with no disease in it.  Then, the aorta was crossclamped and 600 mL of cold blood antegrade cardioplegia was administered in the aortic root with quick arrest of the heart.  Systemic hypothermia to 32 degrees centigrade and topical hypothermia with iced saline was used.  A temperature probe was placed in the septum and insulating pad in the pericardium.  The first distal anastomosis was performed to the posterior descending coronary artery.  The internal diameter was 1.75 mm.  The conduit used was a segment of greater saphenous vein and the anastomosis was performed in an end-to-side manner using continuous 7-0 Prolene suture. Flow was noted through the graft and was excellent.  The second distal anastomosis was performed to the first marginal branch.  The internal diameter of this vessel was 1.75 mm.  The conduit used was a second segment of greater saphenous vein and the anastomosis was performed in a sequential side-to-side manner using continuous 7-0 Prolene suture.  Flow was noted through the graft and was excellent.  The third distal anastomosis was performed to the second marginal branch.  The internal diameter of this vessel was 1.75 mm.  The conduit used was a same segment of greater saphenous vein and the anastomosis was performed in a sequential  end-to-side manner using continuous 7-0 suture.  Flow was noted through the graft and was excellent.  Then, another dose of cardioplegia was given down the vein graft and in the aortic root.  The fourth distal anastomosis was performed to the diagonal branch.  The internal diameter was 1.6 mm.  The conduit used was a third segment of greater saphenous vein and the anastomosis was performed in an end-to- side manner using continuous 7-0 Prolene suture.  Flow was noted through the graft and was excellent.  The fifth distal anastomosis was performed to the distal LAD.  The internal diameter of this vessel was about 2 mm.  The conduit used was the left internal mammary graft and was brought through an opening of the left pericardium and anterior to the phrenic nerve.  This anastomosed the LAD in an end-to-side manner using continuous 8-0 Prolene suture.  The pedicle was sutured to the epicardium with 6-0 Prolene sutures.  Then, another dose of cardioplegia was given.  The patient was rewarmed to 37 degrees centigrade.  With the crossclamp in place, the 3 proximal vein graft anastomoses were performed in the mid- ascending aorta in an end-to-side manner using continuous 6-0 Prolene suture.  Then,  the clamp was removed from the mammary artery pedicle. There was rapid warming of the ventricular septum and return of spontaneous ventricular fibrillation.  The crossclamp was removed with time of 81 minute, and the patient was defibrillated into sinus rhythm. The proximal and distal anastomoses were appeared hemostatic and allowed the grafts satisfactory.  Graft markers were placed from the proximal anastomosis.  Two temporary right ventricular and right atrial pacing wires were placed and brought out through the skin.  When the patient was rewarmed to 37 degrees centigrade, she was weaned from cardiopulmonary bypass on no inotropic agents.  Total bypass time was 100 minutes.  Cardiac function  appeared excellent with a cardiac output of 6 liters/minute.  Protamine was given and the venous and aortic cannulas were removed without difficulty.  Hemostasis was achieved.  Three chest tubes were placed with tube in the posterior pericardium, one in the left pleural space, and one in the anterior mediastinum.  The sternum was then reapproximated with #6 stainless steel wires.  The fascia was closed with continuous #1 Vicryl suture. Subcutaneous tissue was closed with continuous 2-0 Vicryl and the skin with a 3-0 Vicryl subcuticular closure.  The lower extremity vein harvest site was closed in layers in similar manner.  The sponge, needle, and instrument counts were correct according to the scrub nurse. Dry sterile dressing was applied over the incisions around the chest tubes, which were hooked to Pleur-Evac suction.  The patient remained hemodynamically stable and was transferred to the SICU in guarded, but stable condition.     Evelene Croon, M.D.     BB/MEDQ  D:  08/18/2011  T:  08/19/2011  Job:  [id number]

## 2011-08-19 NOTE — Anesthesia Postprocedure Evaluation (Signed)
  Anesthesia Post-op Note  Patient: [name] [name]  Procedure(s) Performed: Procedure(s) (LRB): CORONARY ARTERY BYPASS GRAFTING (CABG) (N/A)  Patient Location: PACU and SICU  Anesthesia Type: General  Level of Consciousness: awake and Patient remains intubated per anesthesia plan  Airway and Oxygen Therapy: Patient remains intubated per anesthesia plan  Post-op Pain: mild  Post-op Assessment: Post-op Vital signs reviewed  Post-op Vital Signs: Reviewed  Complications: No apparent anesthesia complications

## 2011-08-19 NOTE — Progress Notes (Signed)
Patient ID: [name] [name], female   DOB: 30-Dec-1945, 66 y.o.   MRN: [id number] [facility] DAILY PROGRESS NOTE                   [address]            [name] [address]          [phone]      1 Day Post-Op Procedure(s) (LRB): CORONARY ARTERY BYPASS GRAFTING (CABG) (N/A)  Total Length of Stay:  LOS: 1 day   Subjective: Awake and neuro intact, extubated last pm  Objective: Vital signs in last 24 hours: Temp:  [96.4 F (35.8 C)-99.7 F (37.6 C)] 99.5 F (37.5 C) (07/04 0716) Pulse Rate:  [70-120] 84  (07/04 0700) Cardiac Rhythm:  [-] Normal sinus rhythm (07/04 0700) Resp:  [12-26] 20  (07/04 0700) BP: (79-142)/(44-95) 113/62 mmHg (07/04 0700) SpO2:  [96 %-100 %] 99 % (07/04 0700) Arterial Line BP: (86-169)/(42-97) 151/65 mmHg (07/04 0700) FiO2 (%):  [40 %-50 %] 40 % (07/03 1723) Weight:  [146 lb 2.6 oz (66.3 kg)] 146 lb 2.6 oz (66.3 kg) (07/04 0600)  Filed Weights   08/17/11 1300 08/19/11 0600  Weight: 134 lb 14.7 oz (61.2 kg) 146 lb 2.6 oz (66.3 kg)    Weight change: 11 lb 3.9 oz (5.1 kg)   Hemodynamic parameters for last 24 hours: PAP: (16-31)/(6-18) 30/15 mmHg CO:  [3.1 L/min-4.9 L/min] 4.9 L/min CI:  [1.8 L/min/m2-2.9 L/min/m2] 2.9 L/min/m2  Intake/Output from previous day: 07/03 0701 - 07/04 0700 In: 5281.2 [P.O.:180; I.V.:3671.2; Blood:250; NG/GT:30; IV Piggyback:1150] Out: 4460 [Urine:3370; Emesis/NG output:100; Blood:500; Chest Tube:490]  Intake/Output this shift:    Current Meds: Scheduled Meds:   . acetaminophen (TYLENOL) oral liquid 160 mg/5 mL  650 mg Per Tube NOW   Or  . acetaminophen  650 mg Rectal NOW  . acetaminophen  1,000 mg Oral Q6H   Or  . acetaminophen (TYLENOL) oral liquid 160 mg/5 mL  975 mg Per Tube Q6H  . aspirin EC  325 mg Oral Daily   Or  . aspirin  324 mg Per Tube Daily  . atorvastatin  20 mg Oral q1800  . bisacodyl  10 mg Oral Daily   Or  . bisacodyl  10 mg Rectal Daily  . cefUROXime (ZINACEF)  IV   1.5 g Intravenous To OR  . cefUROXime (ZINACEF)  IV  1.5 g Intravenous Q12H  . docusate sodium  200 mg Oral Daily  . famotidine (PEPCID) IV  20 mg Intravenous Q12H  . insulin aspart  0-24 Units Subcutaneous Q2H   Followed by  . insulin aspart  0-24 Units Subcutaneous Q4H  . insulin regular  0-10 Units Intravenous TID WC  . magnesium sulfate  4 g Intravenous Once  . metoprolol tartrate  12.5 mg Oral BID   Or  . metoprolol tartrate  12.5 mg Per Tube BID  . nitrofurantoin  100 mg Oral Daily  . nitroGLYCERIN  2-200 mcg/min Intravenous To OR  . nitroglycerin-nicardipine-HEPARIN-sodium bicarbonate irrigation for artery spasm   Irrigation To OR  . oxybutynin  10 mg Oral Daily  . pantoprazole  40 mg Oral Q1200  . potassium chloride  10 mEq Intravenous Q1 Hr x 3  . sodium chloride  3 mL Intravenous Q12H  . tamoxifen  20 mg Oral Daily  . tranexamic acid  15 mg/kg Intravenous To OR  . vancomycin  1,250 mg Intravenous To OR  . vancomycin  1,000  mg Intravenous Once                                                                                                         Continuous Infusions:   . sodium chloride 20 mL/hr (08/18/11 1315)  . sodium chloride 20 mL/hr (08/18/11 1315)  . sodium chloride    . dexmedetomidine Stopped (08/19/11 0500)  . insulin (NOVOLIN-R) infusion Stopped (08/19/11 0013)  . lactated ringers 20 mL/hr (08/18/11 1315)  . nitroGLYCERIN Stopped (08/19/11 0000)  . phenylephrine (NEO-SYNEPHRINE) Adult infusion 10 mcg/min (08/19/11 0203)   PRN Meds:.albumin human, lactated ringers, metoprolol, midazolam, morphine injection, morphine injection, ondansetron (ZOFRAN) IV, oxyCODONE, sodium chloride, DISCONTD: 0.9 % irrigation (POUR BTL), DISCONTD: hemostatic agents, DISCONTD: Surgifoam 1 Gm with Thrombin 20,000 units (20 ml) topical solution  General appearance: alert, cooperative and no distress Neurologic: intact Heart: regular rate and rhythm, S1, S2 normal,  no murmur, click, rub or gallop and normal apical impulse Lungs: clear to auscultation bilaterally and normal percussion bilaterally Abdomen: soft, non-tender; bowel sounds normal; no masses,  no organomegaly Extremities: extremities normal, atraumatic, no cyanosis or edema and Homans sign is negative, no sign of DVT Wound: stable  Lab Results: CBC: Basename 08/19/11 0330 08/18/11 2025  WBC 9.1 8.8  HGB 8.7* 9.3*  HCT 26.1* 27.6*  PLT 152 148*   BMET:  Basename 08/19/11 0330 08/18/11 2025 08/18/11 2024 08/16/11 1553  NA 136 -- 140 --  K 3.9 -- 3.8 --  CL 102 -- 104 --  CO2 19 -- -- 22  GLUCOSE 108* -- 128* --  BUN 13 -- 12 --  CREATININE 0.57 0.57 -- --  CALCIUM 7.1* -- -- 9.4    PT/INR:  Basename 08/18/11 1326  LABPROT 19.5*  INR 1.62*   Radiology: Dg Chest Portable 1 View  08/18/2011  *RADIOLOGY REPORT*  Clinical Data: Post CABG  PORTABLE CHEST - 1 VIEW  Comparison: Portable exam 1358 hours compared to 08/16/2011  Findings: Tip of endotracheal tube 4.3 cm above carina. Nasogastric tube extends into stomach. Right jugular Swan-Ganz catheter tip projecting over distal main pulmonary artery. Mediastinal drains and left thoracostomy tube present. Normal heart size, mediastinal contours, and pulmonary vascularity. Minimal scattered atelectasis in mid-to-lower lungs. No definite infiltrate, pleural effusion, or right pneumothorax. Probable tiny left apex pneumothorax.  IMPRESSION: Postsurgical changes as above. Probable tiny left apex pneumothorax in patient with left thoracostomy tube.  Original Report Authenticated By: Lollie Marrow, M.D.     Assessment/Plan: S/P Procedure(s) (LRB): CORONARY ARTERY BYPASS GRAFTING (CABG) (N/A) Mobilize Diuresis d/c tubes/lines Continue foley due to strict I&O and patient in ICU See progression orders Expected Acute  Blood - loss Anemia     Delight Ovens MD  Beeper [phone] Office [phone] 08/19/2011 7:46 AM

## 2011-08-19 NOTE — Progress Notes (Signed)
Patient ID: SHAYLEA UCCI, female   DOB: April 19, 1945, 66 y.o.   MRN: [id number]                   [address]            [name] [address]          [phone]     1 Day Post-Op Procedure(s) (LRB): CORONARY ARTERY BYPASS GRAFTING (CABG) (N/A)  Total Length of Stay:  LOS: 1 day  BP 109/64  Pulse 90  Temp 99.7 F (37.6 C) (Oral)  Resp 27  Ht 5\' 6"  (1.676 m)  Wt 146 lb 2.6 oz (66.3 kg)  BMI 23.59 kg/m2  SpO2 93%     . sodium chloride 20 mL/hr (08/18/11 1315)  . sodium chloride 20 mL/hr (08/18/11 1315)  . sodium chloride    . dexmedetomidine Stopped (08/19/11 0500)  . insulin (NOVOLIN-R) infusion Stopped (08/19/11 0013)  . lactated ringers 20 mL/hr at 08/19/11 1900  . nitroGLYCERIN Stopped (08/19/11 0000)  . phenylephrine (NEO-SYNEPHRINE) Adult infusion Stopped (08/19/11 1000)     Lab Results  Component Value Date   WBC 9.0 08/19/2011   HGB 9.3* 08/19/2011   HCT 28.4* 08/19/2011   PLT 170 08/19/2011   GLUCOSE 117* 08/19/2011   CHOL 170 06/04/2011   TRIG 100 06/04/2011   HDL 42 06/04/2011   LDLCALC 108* 06/04/2011   ALT 11 08/16/2011   AST 20 08/16/2011   NA 138 08/19/2011   K 4.1 08/19/2011   CL 103 08/19/2011   CREATININE 0.62 08/19/2011   BUN 10 08/19/2011   CO2 19 08/19/2011   TSH 2.43 07/06/2011   INR 1.62* 08/18/2011   HGBA1C 5.9* 08/16/2011   Off neo Stable day  Delight Ovens MD  Beeper [phone] Office [phone] 08/19/2011 7:43 PM

## 2011-08-20 ENCOUNTER — Inpatient Hospital Stay (HOSPITAL_COMMUNITY): Payer: BC Managed Care – PPO

## 2011-08-20 ENCOUNTER — Encounter (HOSPITAL_COMMUNITY): Payer: Self-pay | Admitting: Surgery

## 2011-08-20 LAB — GLUCOSE, CAPILLARY
Glucose-Capillary: 99 mg/dL (ref 70–99)
Glucose-Capillary: 99 mg/dL (ref 70–99)
Glucose-Capillary: 98 mg/dL (ref 70–99)
Glucose-Capillary: 106 mg/dL — ABNORMAL HIGH (ref 70–99)

## 2011-08-20 LAB — BASIC METABOLIC PANEL
BUN: 11 mg/dL (ref 6–23)
CO2: 24 mEq/L (ref 19–32)
Calcium: 7.8 mg/dL — ABNORMAL LOW (ref 8.4–10.5)
Chloride: 104 mEq/L (ref 96–112)
Creatinine, Ser: 0.55 mg/dL (ref 0.50–1.10)
GFR calc Af Amer: >90 mL/min (ref >90)
GFR calc non Af Amer: >90 mL/min (ref >90)
Glucose, Bld: 114 mg/dL — ABNORMAL HIGH (ref 70–99)
Potassium: 4 mEq/L (ref 3.5–5.1)
Sodium: 138 mEq/L (ref 135–145)

## 2011-08-20 LAB — CBC
HCT: 27.3 % — ABNORMAL LOW (ref 36.0–46.0)
Hemoglobin: 8.8 g/dL — ABNORMAL LOW (ref 12.0–15.0)
MCH: 29.8 pg (ref 26.0–34.0)
MCHC: 32.2 g/dL (ref 30.0–36.0)
MCV: 92.5 fL (ref 78.0–100.0)
Platelets: 156 10*3/uL (ref 150–400)
RBC: 2.95 MIL/uL — ABNORMAL LOW (ref 3.87–5.11)
RDW: 13.9 % (ref 11.5–15.5)
WBC: 9.9 10*3/uL (ref 4.0–10.5)

## 2011-08-20 MED ORDER — INSULIN ASPART 100 UNIT/ML ~~LOC~~ SOLN: 0.0000 [IU] | Freq: Three times a day (TID) | SUBCUTANEOUS | Status: DC

## 2011-08-20 MED ORDER — TRAMADOL HCL 50 MG PO TABS
50.0000 mg | ORAL_TABLET | ORAL | Status: DC | PRN
  Administered 2011-08-20 – 2011-08-21 (×4): 50 mg via ORAL
  Administered 2011-08-21: 100 mg via ORAL
  Administered 2011-08-21 – 2011-08-22 (×4): 50 mg via ORAL
  Administered 2011-08-23: 100 mg via ORAL
  Administered 2011-08-23 (×2): 50 mg via ORAL
  Administered 2011-08-23 – 2011-08-25 (×3): 100 mg via ORAL
  Filled 2011-08-20 (×3): qty 1
  Filled 2011-08-20 (×2): qty 2
  Filled 2011-08-20 (×4): qty 1
  Filled 2011-08-20: qty 2
  Filled 2011-08-20: qty 1
  Filled 2011-08-20 (×2): qty 2
  Filled 2011-08-20 (×2): qty 1

## 2011-08-20 MED ORDER — ASPIRIN 325 MG PO TBEC: 325.0000 mg | DELAYED_RELEASE_TABLET | Freq: Every day | ORAL | Status: DC

## 2011-08-20 MED ORDER — SODIUM CHLORIDE 0.9 % IJ SOLN: 3.0000 mL | INTRAMUSCULAR | Status: DC | PRN

## 2011-08-20 MED ORDER — OXYCODONE HCL 5 MG PO TABS: 5.0000 mg | ORAL_TABLET | ORAL | Status: DC | PRN

## 2011-08-20 MED ORDER — MOVING RIGHT ALONG BOOK
Freq: Once | Status: AC
  Administered 2011-08-20: 09:00:00
  Filled 2011-08-20: qty 1

## 2011-08-20 MED ORDER — SODIUM CHLORIDE 0.9 % IV SOLN: 250.0000 mL | INTRAVENOUS | Status: DC | PRN

## 2011-08-20 MED ORDER — SODIUM CHLORIDE 0.9 % IJ SOLN
3.0000 mL | Freq: Two times a day (BID) | INTRAMUSCULAR | Status: DC
  Administered 2011-08-20 – 2011-08-23 (×7): 3 mL via INTRAVENOUS

## 2011-08-20 MED ORDER — ALUM & MAG HYDROXIDE-SIMETH 200-200-20 MG/5ML PO SUSP: 15.0000 mL | ORAL | Status: DC | PRN

## 2011-08-20 MED FILL — Potassium Chloride Inj 2 mEq/ML: INTRAVENOUS | Qty: 40 | Status: AC

## 2011-08-20 MED FILL — Magnesium Sulfate Inj 50%: INTRAMUSCULAR | Qty: 10 | Status: AC

## 2011-08-20 MED FILL — Dexmedetomidine HCl IV Soln 200 MCG/2ML: INTRAVENOUS | Qty: 2 | Status: AC

## 2011-08-20 NOTE — Care Management Note (Signed)
    Page 1 of 2   08/25/2011     2:40:19 PM   CARE MANAGEMENT NOTE 08/25/2011  Patient:  [name] [name], [name] [name]   Account Number:  [id number]  Date Initiated:  08/20/2011  Documentation initiated by:  Jizel Cheeks  Subjective/Objective Assessment:   PT S/P CABG X 5 ON 08/18/11.  PTA, PT INDEPENDENT, LIVES WITH SPOUSE.     Action/Plan:   MET WITH PT TO DISCUSS DC PLANS.  HUSBAND TO PROVIDE 24H CARE AT DISCHARGE.  WILL FOLLOW FOR HOME NEEDS AS PT PROGRESSES.   Anticipated DC Date:  08/23/2011   Anticipated DC Plan:  HOME W HOME HEALTH SERVICES      DC Planning Services  CM consult      Choice offered to / List presented to:     DME arranged  Levan Hurst      DME agency  [facility]        Status of service:  Completed, signed off Medicare Important Message given?   (If response is "NO", the following Medicare IM given date fields will be blank) Date Medicare IM given:   Date Additional Medicare IM given:    Discharge Disposition:  HOME/SELF CARE  Per UR Regulation:    If discussed at Long Length of Stay Meetings, dates discussed:   08/24/2011    Comments:  08/25/11 [name],RN,BSN 1430 PT FOR DISCHARGE TODAY AFTER BM.  GOUT MUCH IMPROVED, AMBULATING WITHOUT DIFFICULTY NOW.  08/23/11 [name],RN,BSN 1100 PT NOW WITH GOUT FLARE; HAVING DIFFICULTY AMBULATING DUE TO PAIN.  REQUESTS RW FOR HOME.  REFERRAL TO [facility] FOR DME NEEDS.  WILL FOLLOW.

## 2011-08-20 NOTE — Progress Notes (Addendum)
[address]            [name] [address]          [phone]     2 Days Post-Op Procedure(s) (LRB): CORONARY ARTERY BYPASS GRAFTING (CABG) (N/A)  Subjective: OOB in chair.  Mild nausea from Oxycodone, but otherwise feels well.  Walked in halls yesterday without problem.  Objective: Vital signs in last 24 hours: Patient Vitals for the past 24 hrs:  BP Temp Temp src Pulse Resp SpO2 Weight  08/20/11 0700 111/55 mmHg - - 98  21  93 % -  08/20/11 0600 129/69 mmHg - - 93  27  99 % 142 lb 10.2 oz (64.7 kg)  08/20/11 0500 123/56 mmHg - - 87  23  98 % -  08/20/11 0400 122/65 mmHg 99.1 F (37.3 C) Oral 85  26  100 % -  08/20/11 0300 111/60 mmHg - - 80  22  98 % -  08/20/11 0200 118/61 mmHg - - 82  26  99 % -  08/20/11 0100 116/55 mmHg - - 83  21  97 % -  08/20/11 0000 118/58 mmHg 99.2 F (37.3 C) Oral 87  24  91 % -  08/19/11 2300 118/58 mmHg - - 89  28  92 % -  08/19/11 2200 113/56 mmHg - - 85  20  92 % -  08/19/11 2100 113/62 mmHg - - 91  26  93 % -  08/19/11 2000 110/54 mmHg - - 83  19  92 % -  08/19/11 1926 - 99.7 F (37.6 C) Oral - - - -  08/19/11 1900 109/64 mmHg - - 90  27  93 % -  08/19/11 1830 - - - 91  28  94 % -  08/19/11 1800 113/62 mmHg - - 91  17  95 % -  08/19/11 1700 120/58 mmHg - - 85  25  97 % -  08/19/11 1630 115/58 mmHg - - 88  29  96 % -  08/19/11 1602 - 99.3 F (37.4 C) Oral - - - -  08/19/11 1600 110/57 mmHg - - 85  25  96 % -  08/19/11 1500 103/57 mmHg - - 81  21  95 % -  08/19/11 1400 117/61 mmHg - - 90  27  96 % -  08/19/11 1300 116/58 mmHg - - 87  24  95 % -  08/19/11 1200 126/66 mmHg - - 90  25  95 % -  08/19/11 1118 - 99.1 F (37.3 C) Oral - - - -  08/19/11 1100 - - - 91  21  98 % -  08/19/11 1000 132/81 mmHg - - 91  26  99 % -  08/19/11 0900 123/63 mmHg - - 91  19  97 % -  08/19/11 0800 113/66 mmHg 99.5 F (37.5 C) - 86  21  97 % -   Current Weight  08/20/11 142 lb 10.2 oz (64.7 kg)  PRE-OPERATIVE  WEIGHT: 61.2 kg    Intake/Output from previous day: 07/04 0701 - 07/05 0700 In: 1745.5 [P.O.:1130; I.V.:513.5; IV Piggyback:102] Out: 2355 [Urine:2295; Chest Tube:60]  CBGs 122-123-116-99-114  PHYSICAL EXAM:  Heart: RRR Lungs: clear Wound: dressed and dry Extremities: no significant LE edema    Lab Results: CBC: Basename 08/20/11 0345 08/19/11 1630  WBC 9.9 9.0  HGB 8.8*  9.3*  HCT 27.3* 28.4*  PLT 156 170   BMET:  Basename 08/20/11 0345 08/19/11 1630 08/19/11 1625 08/19/11 0330  NA 138 -- 138 --  K 4.0 -- 4.1 --  CL 104 -- 103 --  CO2 24 -- -- 19  GLUCOSE 114* -- 117* --  BUN 11 -- 10 --  CREATININE 0.55 0.62 -- --  CALCIUM 7.8* -- -- 7.1*    PT/INR:  Basename 08/18/11 1326  LABPROT 19.5*  INR 1.62*    CXR: stable, small L pleural effusion  Assessment/Plan: S/P Procedure(s) (LRB): CORONARY ARTERY BYPASS GRAFTING (CABG) (N/A) CV- stable, SR. Continue beta blocker. Expected post-op blood loss anemia- will start Fe. CBGs stable, no h/o DM (A1C=5.9).  Continue to monitor. D/C Foley, [place], tx 2000.  LOS: 2 days    COLLINS,GINA H 08/20/2011   stable for transfer to 2000 I have seen and examined Yisroel Ramming and agree with the above assessment  and plan.  Delight Ovens MD Beeper [phone] Office [phone] 08/20/2011 8:20 AM

## 2011-08-20 NOTE — Progress Notes (Signed)
CARDIAC REHAB PHASE I   PRE:  Rate/Rhythm: 94 SR  BP:  Supine: 117/62  Sitting:   Standing:    SaO2: 94 RA  MODE:  Ambulation: 350 ft   POST:  Rate/Rhythem: 110 ST  BP:  Supine:   Sitting: 132/65  Standing:    SaO2: 95 RA 1340-1430 Assisted X 1 and used walker to ambulate. Gait steady VS stable.Discussed Outpt. CRP with pt, she agrees to referral to [place].  Beatrix Fetters

## 2011-08-20 NOTE — Progress Notes (Signed)
Transferred to 2017 via wheelchair--portable monitor on.  No changes.  Report given to [place], [place]

## 2011-08-20 NOTE — Progress Notes (Signed)
Pt ambulated 150 feet in hallway with RN and rolling walker; pt assisted back to bed; will cont. To monitor.

## 2011-08-21 ENCOUNTER — Inpatient Hospital Stay (HOSPITAL_COMMUNITY): Payer: BC Managed Care – PPO

## 2011-08-21 LAB — GLUCOSE, CAPILLARY
Glucose-Capillary: 107 mg/dL — ABNORMAL HIGH (ref 70–99)
Glucose-Capillary: 96 mg/dL (ref 70–99)

## 2011-08-21 LAB — BASIC METABOLIC PANEL
CO2: 23 mEq/L (ref 19–32)
Calcium: 8.1 mg/dL — ABNORMAL LOW (ref 8.4–10.5)
Sodium: 135 mEq/L (ref 135–145)

## 2011-08-21 LAB — CBC
MCH: 30 pg (ref 26.0–34.0)
Platelets: 186 10*3/uL (ref 150–400)
RBC: 3 MIL/uL — ABNORMAL LOW (ref 3.87–5.11)
WBC: 10.4 10*3/uL (ref 4.0–10.5)

## 2011-08-21 MED ORDER — POTASSIUM CHLORIDE CRYS ER 20 MEQ PO TBCR
20.0000 meq | EXTENDED_RELEASE_TABLET | Freq: Two times a day (BID) | ORAL | Status: DC
  Administered 2011-08-21 – 2011-08-22 (×4): 20 meq via ORAL
  Filled 2011-08-21 (×6): qty 1

## 2011-08-21 MED ORDER — FUROSEMIDE 40 MG PO TABS
40.0000 mg | ORAL_TABLET | Freq: Two times a day (BID) | ORAL | Status: DC
  Administered 2011-08-21 – 2011-08-22 (×4): 40 mg via ORAL
  Filled 2011-08-21 (×7): qty 1

## 2011-08-21 MED ORDER — METOPROLOL TARTRATE 25 MG PO TABS
25.0000 mg | ORAL_TABLET | Freq: Two times a day (BID) | ORAL | Status: DC
  Administered 2011-08-21 – 2011-08-25 (×8): 25 mg via ORAL
  Filled 2011-08-21 (×11): qty 1

## 2011-08-21 NOTE — Progress Notes (Addendum)
                    [address]            [place] [address]          [phone]     3 Days Post-Op Procedure(s) (LRB): CORONARY ARTERY BYPASS GRAFTING (CABG) (N/A)  Subjective: Feels well, no complaints.  Objective: Vital signs in last 24 hours: Patient Vitals for the past 24 hrs:  BP Temp Temp src Pulse Resp SpO2 Weight  08/21/11 0510 - - - - - - 139 lb 12.4 oz (63.4 kg)  08/21/11 0457 119/61 mmHg 98.3 F (36.8 C) Oral 92  19  92 % -  08/20/11 2105 119/66 mmHg 98.8 F (37.1 C) Oral 105  19  92 % -  08/20/11 1352 117/62 mmHg 98 F (36.7 C) Oral 92  18  94 % -  08/20/11 1036 126/72 mmHg 98.4 F (36.9 C) Oral 82  18  94 % -  08/20/11 1000 - - - 89  26  96 % -   Current Weight  08/21/11 139 lb 12.4 oz (63.4 kg)  PRE-OPERATIVE WEIGHT: 61.2 kg    Intake/Output from previous day: 07/05 0701 - 07/06 0700 In: 70 [I.V.:20; IV Piggyback:50] Out: 1475 [Urine:1475]  CBGs 90-107  PHYSICAL EXAM:  Heart: RRR, sl tachy 100s Lungs: few basilar crackles Wound:clean and dry Extremities: trace RLE edema   Lab Results: CBC: Basename 08/21/11 0603 08/20/11 0345  WBC 10.4 9.9  HGB 9.0* 8.8*  HCT 27.9* 27.3*  PLT 186 156   BMET:  Basename 08/21/11 0603 08/20/11 0345  NA 135 138  K 4.1 4.0  CL 103 104  CO2 23 24  GLUCOSE 106* 114*  BUN 15 11  CREATININE 0.57 0.55  CALCIUM 8.1* 7.8*    PT/INR:  Basename 08/18/11 1326  LABPROT 19.5*  INR 1.62*   CXR:  IMPRESSION:  Residual moderate sized bilateral pleural effusions and associated  passive atelectasis in the lower lobes, with improved aeration  since yesterday. No new abnormalities.   Assessment/Plan: S/P Procedure(s) (LRB): CORONARY ARTERY BYPASS GRAFTING (CABG) (N/A) CV- ST, will increase beta blocker. Vol overload/bilateral effusions- will give extra Lasix today. CBGs stable, no h/o DM.  Will d/c CBG checks. CRPI, pulm toilet.   LOS: 3 days    COLLINS,GINA H 08/21/2011   diureses  today Ambulating well Poss home Monday I have seen and examined [name] [name] and agree with the above assessment  and plan.  Delight Ovens MD Beeper [phone] Office [phone] 08/21/2011 10:42 AM

## 2011-08-21 NOTE — Progress Notes (Signed)
CARDIAC REHAB PHASE I   PRE:  Rate/Rhythm: 102 ST  BP:  Supine:   Sitting: 126/66  Standing:    SaO2: 94 2L  MODE:  Ambulation: 550 ft   POST:  Rate/Rhythem: 115 ST  BP:  Supine:   Sitting: 130/68  Standing:   SaO2: 95 2L  Nashonda Limberg Taylor  Pt ambulated 550 ft assist x 1 with rolling walker. Pt tolerated very well, no complaints or symptoms reported. SAO2 remained in the mid-nineties throughout ambulation. Returned to [employer] [facility]. Call bell in reach. Discharge education completed. Covered procedure, sternal precautions, incisional care, diet, home exercise, and signs & symptoms. Pt voiced understanding to education. Pt has already been referred to [facility] and we will follow up with pt.  Electronically signed by Harriett Sine MS on Saturday August 21 2011 at [phone]

## 2011-08-21 NOTE — Progress Notes (Signed)
08/21/2011 1400 Nursing note Pt. Ambulated 221ft with NT and on 2L o2 [place]. Pt. Tolerated well. Encouraged one more walk today. Will continue to monitor.  Shantoria Ellwood, Blanchard Kelch

## 2011-08-21 NOTE — Progress Notes (Signed)
08/21/2011 2:34 PM Nursing note Discussed pt. Hx of lumpectomy two years ago of left breast. Pt. States she still has lymph nodes on left side. Pt. IV site currently located on left wrist dated for 08/18/11. Pt. Told RN that staff were "ok to use that arm". Spoke with Coral Ceo PA-C. Verbal orders received ok to leave iv site in current location. Will continue to monitor.  Carlina Derks, Blanchard Kelch

## 2011-08-21 NOTE — Progress Notes (Signed)
Contacted [facility] for RW for home. Isidoro Donning RN CCM Case Mgmt phone [phone]

## 2011-08-22 MED ORDER — POTASSIUM CHLORIDE CRYS ER 20 MEQ PO TBCR: 20.0000 meq | EXTENDED_RELEASE_TABLET | Freq: Every day | ORAL | Status: DC

## 2011-08-22 MED ORDER — FUROSEMIDE 40 MG PO TABS: 40.0000 mg | ORAL_TABLET | Freq: Every day | ORAL | Status: DC

## 2011-08-22 NOTE — Progress Notes (Signed)
08/22/2011 8:42 AM Nursing note Pt. Ambulated 300 ft with NT, RW and on RA. Pt. o2 sats remained greater than 93% during walk. HR remained in ST during walk. Pt. Tolerated well. Encouraged two more walks this evening.  [name] [name], Blanchard Kelch

## 2011-08-22 NOTE — Progress Notes (Signed)
08/22/2011 12:14 PM Nursing note EPW and CTS d/c per orders and per protocol. Ends intact. Pt. Tolerated well. Benzoin and steri strips applied to CT sites. Frequent vital signs collected per protocol. Last documented INR was 1.63 on 08/18/11. Advised pt of bed rest for one hour. Call bell within reach. Will continue to monitor. Pahoua Schreiner, Blanchard Kelch

## 2011-08-22 NOTE — Progress Notes (Signed)
[address]            [name] [address]          [phone]         Discharge Summary  Name: [name] [name] DOB: 1945/11/02 66 y.o. MRN: [id number]   Admission Date: 08/18/2011 Discharge Date:     Admitting Diagnosis: Active Problems:  Coronary artery disease    Discharge Diagnosis:  Active Problems:  Coronary artery disease History of breast cancer, status post radiation therapy, lumpectomy and on chronic tamoxifen Interstitial cystitis Hyperlipidemia Frequent urinary tract infections, on chronic antibiotic prophylaxis Expected postoperative blood loss anemia    Procedures: Procedure(s): CORONARY ARTERY BYPASS GRAFTING x 5 (left internal mammary artery to the LAD, saphenous vein graft to the diagonal, sequential saphenous vein graft to first and second obtuse marginals, saphenous vein graft to posterior descending), endoscopic vein harvest right leg on 08/18/2011   HPI:  The patient is a 66 y.o. female with no prior history of cardiac disease who began having exercise-induced chest pressure radiating into both arms with aching in her elbows and numbness in her hands since March of 2013. She has also had exertional shortness of breath. Her symptoms always resolved with rest. She had a Myoview examination showing inferior wall ischemia. Cardiac catheterization was performed on 08/12/2011 by Dr. Elease Hashimoto, which showed 60-70% stenosis at the origin of the LAD. There is a large first diagonal branch that had at least 50% proximal stenosis. The left circumflex gave off 2 marginal branches. The first appeared to have about 50% ostial stenosis. There is 60% distal left circumflex stenosis before the second marginal branch. The right coronary was chronically occluded proximally with filling of the distal vessel by collaterals from the left. She was referred to Dr. Evelene Croon for consideration of surgical revascularization. After review of her  films, Dr. Laneta Simmers felt she would benefit from coronary artery bypass grafting. All risks, benefits and alternatives of surgery were explained in detail, and the patient agreed to proceed.    Hospital Course:  The patient was admitted to [facility] on 08/18/2011. The patient was taken to the operating room and underwent the above procedure.    The postoperative course has generally been uneventful. She was transferred to the step down unit on postop day 2. She has remained afebrile and in normal sinus rhythm. She was started on a low-dose beta blocker and this has been titrated upward for mild tachycardia. She has also had small bilateral pleural effusions on chest x-ray and a generalized volume overloaded and was started on Lasix. She currently is diuresing well. She is ambulating in the halls independently and is tolerating a regular diet. We anticipate discharge home within the next 24 hours provided no acute changes occur.     Recent vital signs:  Filed Vitals:   08/22/11 0354  BP: 120/72  Pulse: 97  Temp: 98.2 F (36.8 C)  Resp: 18    Recent laboratory studies:  CBC: Basename 08/21/11 0603 08/20/11 0345  WBC 10.4 9.9  HGB 9.0* 8.8*  HCT 27.9* 27.3*  PLT 186 156   BMET:  Basename 08/21/11 0603 08/20/11 0345  NA 135 138  K 4.1 4.0  CL 103 104  CO2 23 24  GLUCOSE 106* 114*  BUN 15 11  CREATININE 0.57 0.55  CALCIUM 8.1* 7.8*    PT/INR: No results found for  this basename: LABPROT,INR in the last 72 hours   Discharge Medications:   Medication List  As of 08/22/2011 10:03 AM   STOP taking these medications         nitroGLYCERIN 0.4 MG SL tablet      SYSTANE ULTRA 0.4-0.3 % Soln         TAKE these medications         aspirin 325 MG EC tablet   Take 1 tablet (325 mg total) by mouth daily.      atorvastatin 20 MG tablet   Commonly known as: LIPITOR   Take 20 mg by mouth daily.      EPIPEN 0.3 mg/0.3 mL Devi   Generic drug: EPINEPHrine   Inject 0.3 mg into the  muscle once as needed. For allergic reactions.      furosemide 40 MG tablet   Commonly known as: LASIX   Take 1 tablet (40 mg total) by mouth daily. X 1 week      metoprolol succinate 25 MG 24 hr tablet   Commonly known as: TOPROL-XL   Take 50 mg by mouth daily.      nitrofurantoin 100 MG capsule   Commonly known as: MACRODANTIN   Take 100 mg by mouth daily.      oxybutynin 10 MG 24 hr tablet   Commonly known as: DITROPAN-XL   Take 10 mg by mouth daily.      oxyCODONE 5 MG immediate release tablet   Commonly known as: Oxy IR/ROXICODONE   Take 1-2 tablets (5-10 mg total) by mouth every 4 (four) hours as needed.      potassium chloride SA 20 MEQ tablet   Commonly known as: K-DUR,KLOR-CON   Take 1 tablet (20 mEq total) by mouth daily. X 1 week      tamoxifen 20 MG tablet   Commonly known as: NOLVADEX   Take 20 mg by mouth daily.             Discharge Instructions:  The patient is to refrain from driving, heavy lifting or strenuous activity.  May shower daily and clean incisions with soap and water.  May resume regular diet.   Follow Up:  Discharge Orders    Future Appointments: Provider: Department: Dept Phone: Center:   09/21/2011 1:30 PM Alleen Borne, MD [facility] [phone] [facility]   09/30/2011 12:30 PM Dava Najjar Idelle Jo [facility] [phone] None   09/30/2011 1:00 PM Victorino December, MD [facility] [phone] None   10/06/2011 8:50 AM [facility] [phone] [facility]     Future Orders Please Complete By Expires   Amb Referral to Cardiac Rehabilitation         Follow-up Information    Follow up with Alleen Borne, MD. (09/21/11 at 1:30 pm with Dr. Laneta Simmers. Obtain chest xray from [facility] at 12;30 .  [facility] is located in same office complex)    Contact information:   [address] [place] [address] [phone]       Follow up with Elyn Aquas., MD. (2 weeks- contact  office to arrange)    Contact information:   [address], [address] [place] [address] [phone]           Havyn Ramo H 08/22/2011, 10:03 AM

## 2011-08-22 NOTE — Progress Notes (Addendum)
                    [address]            [place] [address]          [phone]     4 Days Post-Op Procedure(s) (LRB): CORONARY ARTERY BYPASS GRAFTING (CABG) (N/A)  Subjective: Just walked around unit.  Feels well.  Some leg soreness.  Objective: Vital signs in last 24 hours: Patient Vitals for the past 24 hrs:  BP Temp Temp src Pulse Resp SpO2 Weight  08/22/11 0354 120/72 mmHg 98.2 F (36.8 C) Oral 97  18  91 % 133 lb 11.2 oz (60.646 kg)  08/21/11 2108 116/76 mmHg - - 109  - - -  08/21/11 2025 118/76 mmHg 98.3 F (36.8 C) Oral 102  20  98 % -  08/21/11 1259 114/72 mmHg 98.4 F (36.9 C) Oral 91  20  99 % -  08/21/11 0957 108/57 mmHg - - 104  - - -   Current Weight  08/22/11 133 lb 11.2 oz (60.646 kg)     Intake/Output from previous day: 07/06 0701 - 07/07 0700 In: 360 [P.O.:360] Out: 2951 [Urine:2951]    PHYSICAL EXAM:  Heart: RRR, sl tachy Lungs: clear Wound: clean and dry Extremities: trace RLE edema    Lab Results: CBC: Basename 08/21/11 0603 08/20/11 0345  WBC 10.4 9.9  HGB 9.0* 8.8*  HCT 27.9* 27.3*  PLT 186 156   BMET:  Basename 08/21/11 0603 08/20/11 0345  NA 135 138  K 4.1 4.0  CL 103 104  CO2 23 24  GLUCOSE 106* 114*  BUN 15 11  CREATININE 0.57 0.55  CALCIUM 8.1* 7.8*    PT/INR: No results found for this basename: LABPROT,INR in the last 72 hours    Assessment/Plan: S/P Procedure(s) (LRB): CORONARY ARTERY BYPASS GRAFTING (CABG) (N/A) CV- ST, 100-110, BPs borderline low.  Lopressor increased yesterday.  Monitor. Vol overload- continue diuresis. CRPI, pulm toilet. Possibly home in am if stable.   LOS: 4 days    COLLINS,GINA H 08/22/2011  good progress Poss home in am I have seen and examined [name] [name] and agree with the above assessment  and plan.  Delight Ovens MD Beeper [phone] Office [phone] 08/22/2011 9:24 AM

## 2011-08-22 NOTE — Progress Notes (Signed)
08/22/2011 6:23 PM Nursing note Pt. Ambulated independently around unit with husband approximately 350 ft. Pt. Tolerated well.  [name] [name], Blanchard Kelch

## 2011-08-22 NOTE — Progress Notes (Signed)
08/22/2011 2:45 PM Nursing note Pt. Ambulated 250 ft with RW, RN and on RA. Pt. Tolerated well. Encouraged one more walk this evening. Will continue to monitor.  Maleeha Halls, Blanchard Kelch

## 2011-08-23 DIAGNOSIS — Z951 Presence of aortocoronary bypass graft: Secondary | ICD-10-CM

## 2011-08-23 MED ORDER — COLCHICINE 0.6 MG PO TABS: 0.6000 mg | ORAL_TABLET | Freq: Two times a day (BID) | ORAL | Status: DC | PRN

## 2011-08-23 MED ORDER — LACTULOSE 10 GM/15ML PO SOLN
20.0000 g | Freq: Once | ORAL | Status: DC
  Filled 2011-08-23: qty 30

## 2011-08-23 MED ORDER — OXYCODONE HCL 5 MG PO TABS: 5.0000 mg | ORAL_TABLET | ORAL | Status: DC | PRN

## 2011-08-23 MED ORDER — FUROSEMIDE 40 MG PO TABS: 40.0000 mg | ORAL_TABLET | Freq: Every day | ORAL | Status: DC

## 2011-08-23 MED ORDER — POTASSIUM CHLORIDE CRYS ER 20 MEQ PO TBCR
20.0000 meq | EXTENDED_RELEASE_TABLET | Freq: Every day | ORAL | Status: DC
  Administered 2011-08-23 – 2011-08-25 (×3): 20 meq via ORAL
  Filled 2011-08-23 (×2): qty 1

## 2011-08-23 MED ORDER — POLYETHYL GLYCOL-PROPYL GLYCOL 0.4-0.3 % OP SOLN: 1.0000 [drp] | Freq: Every day | OPHTHALMIC | Status: DC | PRN

## 2011-08-23 MED ORDER — FUROSEMIDE 40 MG PO TABS
40.0000 mg | ORAL_TABLET | Freq: Every day | ORAL | Status: DC
  Administered 2011-08-23 – 2011-08-25 (×3): 40 mg via ORAL
  Filled 2011-08-23 (×3): qty 1

## 2011-08-23 MED ORDER — ASPIRIN 325 MG PO TBEC: 325.0000 mg | DELAYED_RELEASE_TABLET | Freq: Every day | ORAL | Status: DC

## 2011-08-23 MED ORDER — POTASSIUM CHLORIDE CRYS ER 20 MEQ PO TBCR: 20.0000 meq | EXTENDED_RELEASE_TABLET | Freq: Every day | ORAL | Status: DC

## 2011-08-23 MED ORDER — COLCHICINE 0.6 MG PO TABS
0.6000 mg | ORAL_TABLET | Freq: Two times a day (BID) | ORAL | Status: DC | PRN
  Administered 2011-08-23 (×2): 0.6 mg via ORAL
  Filled 2011-08-23 (×2): qty 1

## 2011-08-23 MED ORDER — TRAMADOL HCL 50 MG PO TABS: 50.0000 mg | ORAL_TABLET | ORAL | Status: DC | PRN

## 2011-08-23 MED FILL — Mannitol IV Soln 20%: INTRAVENOUS | Qty: 500 | Status: AC

## 2011-08-23 MED FILL — Sodium Chloride Irrigation Soln 0.9%: Qty: 3000 | Status: AC

## 2011-08-23 MED FILL — Electrolyte-R (PH 7.4) Solution: INTRAVENOUS | Qty: 1000 | Status: AC

## 2011-08-23 MED FILL — Heparin Sodium (Porcine) Inj 1000 Unit/ML: INTRAMUSCULAR | Qty: 30 | Status: AC

## 2011-08-23 MED FILL — Sodium Chloride IV Soln 0.9%: INTRAVENOUS | Qty: 1000 | Status: AC

## 2011-08-23 MED FILL — Sodium Bicarbonate IV Soln 8.4%: INTRAVENOUS | Qty: 50 | Status: AC

## 2011-08-23 MED FILL — Heparin Sodium (Porcine) Inj 1000 Unit/ML: INTRAMUSCULAR | Qty: 10 | Status: AC

## 2011-08-23 NOTE — Progress Notes (Signed)
Pt ambulated with walker and husband in hallway, knee stiff per pt report but is better as she walks.  Will monitor patient. Callista Hoh, Randall An RN

## 2011-08-23 NOTE — Progress Notes (Signed)
PROGRESS NOTE  Subjective:   [name] [name] is doing well from a cardiac standpoint.  C/o swollen, warm and tender right knee.   Objective:    Vital Signs:   Temp:  [98.7 F (37.1 C)-99.2 F (37.3 C)] 99.1 F (37.3 C) (07/08 0431) Pulse Rate:  [90-104] 92  (07/08 0431) Resp:  [18-20] 19  (07/08 0431) BP: (100-126)/(54-73) 105/64 mmHg (07/08 0431) SpO2:  [92 %-94 %] 92 % (07/08 0431) Weight:  [138 lb 10.7 oz (62.9 kg)] 138 lb 10.7 oz (62.9 kg) (07/08 0431)  Last BM Date: 08/18/11   24-hour weight change: Weight change: 4 lb 15.5 oz (2.254 kg)  Weight trends: Filed Weights   08/21/11 0510 08/22/11 0354 08/23/11 0431  Weight: 139 lb 12.4 oz (63.4 kg) 133 lb 11.2 oz (60.646 kg) 138 lb 10.7 oz (62.9 kg)    Intake/Output:  07/07 0701 - 07/08 0700 In: 360 [P.O.:360] Out: 300 [Urine:300]     Physical Exam: BP 105/64  Pulse 92  Temp 99.1 F (37.3 C) (Oral)  Resp 19  Ht 5\' 6"  (1.676 m)  Wt 138 lb 10.7 oz (62.9 kg)  BMI 22.38 kg/m2  SpO2 92%  General: Vital signs reviewed and noted. Well-developed, well-nourished, in no acute distress; alert, appropriate and cooperative .  Head: Normocephalic, atraumatic.  Eyes: conjunctivae/corneas clear.  EOM's intact.   Throat: normal  Neck: Supple. Normal carotids. No JVD  Lungs:  Clear to auscultation  Heart: Regular rate,  With normal  S1 S2. No murmurs, gallops or rubs. Sternotomy is healing well.  Abdomen:  Soft, non-tender, non-distended with normoactive bowel sounds. No hepatomegaly. No rebound/guarding. No abdominal masses.  Extremities: Distal pedal pulses are 2+ .  Right knee is warm to touch, tender and mildly swollen.  Neurologic: A&O X3, CN II - XII are grossly intact. Motor strength is 5/5 in the all 4 extremities.  Psych: Responds to questions appropriately with normal affect.    Labs: BMET:  Basename 08/21/11 0603  NA 135  K 4.1  CL 103  CO2 23  GLUCOSE 106*  BUN 15  CREATININE 0.57  CALCIUM 8.1*  MG --  PHOS  --    CBC:  Basename 08/21/11 0603  WBC 10.4  NEUTROABS --  HGB 9.0*  HCT 27.9*  MCV 93.0  PLT 186    Cardiac Enzymes: No results found for this basename: CKTOTAL:4,CKMB:4,TROPONINI:4 in the last 72 hours  Coagulation Studies: No results found for this basename: LABPROT:5,INR:5 in the last 72 hours   Tele:  NSR  Medications:    Infusions:    Scheduled Medications:    . aspirin EC  325 mg Oral Daily  . atorvastatin  20 mg Oral q1800  . bisacodyl  10 mg Oral Daily   Or  . bisacodyl  10 mg Rectal Daily  . docusate sodium  200 mg Oral Daily  . furosemide  40 mg Oral Daily  . lactulose  20 g Oral Once  . metoprolol tartrate  25 mg Oral BID  . nitrofurantoin  100 mg Oral Daily  . oxybutynin  10 mg Oral Daily  . pantoprazole  40 mg Oral Q1200  . potassium chloride  20 mEq Oral Daily  . sodium chloride  3 mL Intravenous Q12H  . tamoxifen  20 mg Oral Daily  . DISCONTD: furosemide  40 mg Oral BID  . DISCONTD: potassium chloride  20 mEq Oral BID    Assessment/ Plan:   1. CAD - s/p CABG.  Overall doing well.    2. Tender right knee, :  Uric acid level has been ordered.  I will try several doses of colchicine to see if that helps.  I think she may have gout.[name]  She will need follow up for this.   Disposition: continue current cardiac meds. Try colchicine. Length of Stay: 5  Vesta Mixer, Montez Hageman., MD, [facility] 08/23/2011, 8:07 AM Office [phone] Pager [phone]

## 2011-08-23 NOTE — Progress Notes (Signed)
Pt ambulated x1 assist with walker, issue is the right knee pain. By the end of the walk pt able to have more support on the rt knee.  Pt back in chair. Ambulated 300 feet. Will monitor patient. Hazael Olveda, Randall An RN

## 2011-08-23 NOTE — Progress Notes (Addendum)
5 Days Post-Op Procedure(s) (LRB): CORONARY ARTERY BYPASS GRAFTING (CABG) (N/A)  Subjective: Patient with complaints of right knee pain this morning. It started yesterday. She denies any trauma. She states that this is her "trick knee" and has had pain in it before, but not "this bad". She has no history of gout.Also, has not had a bowel movement yet.  Objective: Vital signs in last 24 hours: Patient Vitals for the past 24 hrs:  BP Temp Temp src Pulse Resp SpO2 Weight  08/23/11 0431 105/64 mmHg 99.1 F (37.3 C) Oral 92  19  92 % 138 lb 10.7 oz (62.9 kg)  08/22/11 2113 118/70 mmHg - - - - - -  08/22/11 1923 116/73 mmHg 98.7 F (37.1 C) Oral 93  20  94 % -  08/22/11 1407 114/63 mmHg 99.2 F (37.3 C) Oral 101  18  92 % -  08/22/11 1330 108/54 mmHg - - 94  - - -  08/22/11 1300 109/62 mmHg - - 90  - - -  08/22/11 1245 100/62 mmHg - - 102  - - -  08/22/11 1230 122/68 mmHg - - 93  - - -  08/22/11 1215 126/65 mmHg - - 94  - - -  08/22/11 1200 111/66 mmHg - - 104  - - -   Pre op weight 61.2 kg Current Weight  08/23/11 138 lb 10.7 oz (62.9 kg)      Intake/Output from previous day: 07/07 0701 - 07/08 0700 In: 360 [P.O.:360] Out: 300 [Urine:300]   Physical Exam:  Cardiovascular: RRR, no murmurs, gallops, or rubs. Pulmonary: Clear to auscultation bilaterally; no rales, wheezes, or rhonchi. Abdomen: Soft, non tender, bowel sounds present. Extremities: Trace bilateral lower extremity edema.Right knee is warmer and slightly swollen medially. Wounds: Clean and dry.  No erythema or signs of infection.  Lab Results: CBC: Basename 08/21/11 0603  WBC 10.4  HGB 9.0*  HCT 27.9*  PLT 186   BMET:  Basename 08/21/11 0603  NA 135  K 4.1  CL 103  CO2 23  GLUCOSE 106*  BUN 15  CREATININE 0.57  CALCIUM 8.1*    Assessment/Plan:  1. CV - SR. Continue Lopressor 25 bid. 2.  Pulmonary - Encourage incentive spirometer. 3. Volume Overload - Continue with diuresis. 4.  Acute blood loss  anemia - Last H and H stable at 9 and 27.9. 5.Right knee pain-questionable gout. Will check a uric acid. 6.LOC for constipation. 7.Possible discharge in 1-2 days.   ZIMMERMAN,DONIELLE MPA-C 08/23/2011   I have seen and examined [name] [name] and agree with the above assessment  and plan.  Delight Ovens MD Beeper [phone] Office [phone] 08/23/2011 3:45 PM

## 2011-08-23 NOTE — Progress Notes (Signed)
UR completed.    Donnamarie Shankles Wise Phil Corti, RN, BSN Phone #[phone]  

## 2011-08-23 NOTE — Discharge Summary (Signed)
Physician Discharge Summary  Patient ID: [name] [name] MRN: [id number] DOB/AGE: 1945/05/14 66 y.o.  Admit date: 08/18/2011 Discharge date: 08/25/2011  Admission Diagnoses: 1.Multi vessel CAD 2.History of hyperlipidemia 3.History of tobacco abuse 4.History of frequent UTI 5.History of breast cancer  Discharge Diagnoses:  1.Multi vessel CAD 2.History of hyperlipidemia 3.History of tobacco abuse 4.History of frequent UTI 5.History of breast cancer 6.ABL anemia 7.Right knee pain-likely secondary to gout  Procedure (s):  Median sternotomy, extracorporeal circulation,  coronary artery bypass graft surgery x5 using a left internal mammary artery graft to the left anterior descending coronary artery with a saphenous vein graft to the diagonal branch of the left anterior descending artery, a sequential saphenous vein graft to the first and second obtuse marginal branch of the left circumflex coronary artery, and a saphenous vein graft to the posterior descending branch of the right coronary artery. Endoscopic vein harvesting from the right leg by Dr. Laneta Simmers on 08/18/2011.   History of Presenting Illness: This is a 66 year old woman with no prior history of cardiac disease who began having exercise-induced chest pressure radiating into both arms, with aching in her elbows and numbness in her hands since March of 2013. [name] also had exertional shortness of breath. Her symptoms always resolved with rest. She had a Myoview examination that showed inferior wall ischemia. A cardiac catheterization done 08/12/2011 showed 60-70% stenosis at the origin of the LAD, a large first diagonal branch that had at least 50% proximal stenosis, a left circumflex gave off 2 marginal branches; the first appeared to have about 50% ostial stenosis and there was a 60% distal left circumflex stenosis before the second marginal branch. The right coronary was chronically occluded proximally with filling of the distal  vessel by collaterals from the left. She was then seen in the office by Dr. Laneta Simmers on 08/13/2011 to discuss the need for coronary artery bypass grafting surgery. Potential risks, complications, and benefits were discussed with the patient and she agreed to proceed with surgery. She was admitted to [facility] on 08/18/2011 in order to undergo a CABGx5.  Brief Hospital Course:  She was extubated without difficulty early the evening of surgery. She remained afebrile and hemodynamically stable. Her Theone Murdoch, a line, chest tubes, and foley were all removed early in her post operative course. She was started on a low dose Lopressor, which was titrated accordingly. She was volume overloaded and diuresed. She was found to have acute blood loss anemia. Her H and H went as low as 8.7 and 26.1. She did not require a post operative transfusion. Her last H and H was up to 9 and 27.9. She was transferred from the ICU to [facility] for further convalescence on 08/20/2011.She has been tolerating a diet but has not had a bowel movement yet. She will be given a laxative. Her epicardial pacing wires and chest tube sutures have been removed. She developed acute onset of right knee pain yesterday. On examination, the right knee is swollen medially, tender, and warm. It was initially thought that this may be gout. She was given   colchicine. Uric acid was 4.7. An orthopedic consult was then obtained. It was felt she likely had gout. Her right knee was minimally swollen and not really tender this morning. She is able to ambulate without difficulty. She is to follow up with Dr. Charlann Boxer PRN.Provided she is able to have a bowel movement, she will be discharged later today.  Latest Vital Signs: Blood pressure 105/64, pulse 92, temperature  99.1 F (37.3 C), temperature source Oral, resp. rate 19, height 5\' 6"  (1.676 m), weight 138 lb 10.7 oz (62.9 kg), SpO2 92.00%.  Physical Exam: Cardiovascular: RRR, no murmurs, gallops, or rubs.  Pulmonary:  Clear to auscultation bilaterally; no rales, wheezes, or rhonchi.  Abdomen: Soft, non tender, bowel sounds present.  Extremities: Trace bilateral lower extremity edema.Right knee is warmer and slightly swollen medially.  Wounds: Clean and dry. No erythema or signs of infection.   Discharge Condition:Stable  Recent laboratory studies:  Lab Results  Component Value Date   WBC 10.4 08/21/2011   HGB 9.0* 08/21/2011   HCT 27.9* 08/21/2011   MCV 93.0 08/21/2011   PLT 186 08/21/2011   Lab Results  Component Value Date   NA 135 08/21/2011   K 4.1 08/21/2011   CL 103 08/21/2011   CO2 23 08/21/2011   CREATININE 0.57 08/21/2011   GLUCOSE 106* 08/21/2011      Diagnostic Studies: Dg Chest 2 View  08/21/2011  *RADIOLOGY REPORT*  Clinical Data: 3 days post CABG.  CHEST - 2 VIEW  Comparison: Portable chest x-rays yesterday and dating back to 08/18/2011.  Findings: Removal of the right jugular introducer sheath. Sternotomy for CABG.  Cardiac silhouette upper normal in size, unchanged.  Residual moderate sized bilateral pleural effusions and associated passive atelectasis in the lower lobes, with improved aeration since yesterday.  Pulmonary vascularity normal.  No new pulmonary parenchymal abnormalities.  IMPRESSION: Residual moderate sized bilateral pleural effusions and associated passive atelectasis in the lower lobes, with improved aeration since yesterday.  No new abnormalities.  Original Report Authenticated By: Arnell Sieving, M.D.    Discharge Orders    Future Appointments: Provider: Department: Dept Phone: Center:   09/21/2011 1:30 PM Alleen Borne, MD [facility] [phone] [facility]   09/30/2011 12:30 PM Sherrie Mustache [facility] [phone] None   09/30/2011 1:00 PM Victorino December, MD [facility] [phone] None   10/06/2011 8:50 AM [facility] [employer] [facility] [phone] [facility]     Future Orders Please Complete By Expires   Amb Referral to Cardiac Rehabilitation          Discharge Medications: Medication List  As of 08/25/2011  8:16 AM   STOP taking these medications         nitroGLYCERIN 0.4 MG SL tablet         TAKE these medications         aspirin 325 MG EC tablet   Take 1 tablet (325 mg total) by mouth daily.      atorvastatin 20 MG tablet   Commonly known as: LIPITOR   Take 20 mg by mouth daily.      colchicine 0.6 MG tablet   Take 1 tablet (0.6 mg total) by mouth 2 (two) times daily as needed.      EPIPEN 0.3 mg/0.3 mL Devi   Generic drug: EPINEPHrine   Inject 0.3 mg into the muscle once as needed. For allergic reactions.      metoprolol succinate 25 MG 24 hr tablet   Commonly known as: TOPROL-XL   Take 50 mg by mouth daily.      nitrofurantoin 100 MG capsule   Commonly known as: MACRODANTIN   Take 100 mg by mouth daily.      oxybutynin 10 MG 24 hr tablet   Commonly known as: DITROPAN-XL   Take 10 mg by mouth daily.      Polyethyl Glycol-Propyl Glycol 0.4-0.3 % Soln  Apply 1 drop to eye daily as needed.      tamoxifen 20 MG tablet   Commonly known as: NOLVADEX   Take 20 mg by mouth daily.      traMADol 50 MG tablet   Commonly known as: ULTRAM   Take 1-2 tablets (50-100 mg total) by mouth every 4 (four) hours as needed for pain.              Follow Up Appointments: Follow-up Information    Follow up with Alleen Borne, MD. (09/21/11 at 1:30 pm with Dr. Laneta Simmers. Obtain chest xray from [facility] at 12;30 .  [facility] is located in same office complex)    Contact information:   [address] [place] [address] [phone]       Follow up with Elyn Aquas., MD. (2 weeks- contact office to arrange)    Contact information:   [address], [address] [place] [address] [phone]       Follow up with KNAPP,[name], MD. (Call for a follow up appointment regarding HGA1C 5.9)    Contact information:   [address] [place]  [place] [address] [phone]          Signed: Doree Fudge MPA-C 08/23/2011, 8:33 AM

## 2011-08-23 NOTE — Progress Notes (Signed)
Pt ambulated  150 ft using walker with complaints of right knee stiffness,Pt claimed much better after the walk.O2 saturation is 94% on room air after walking. Deniesha Stenglein RN

## 2011-08-23 NOTE — Progress Notes (Signed)
CARDIAC REHAB PHASE I   PRE:  Rate/Rhythm: 107 ST    BP: sitting 114/62    SaO2: 95 RA  MODE:  Ambulation: 220 ft   POST:  Rate/Rhythm: 122 ST    BP: sitting 132/66     SaO2: 95 RA  Major issue is knee pain. Able to support weight 50 % on right leg with RW. Encouraged pt not to compensate by applying too much weight on arms. Tolerated fairly well. Will walk more today. Great motivation.  [phone]   Harriet Masson CES, ACSM

## 2011-08-24 MED ORDER — LACTULOSE 10 GM/15ML PO SOLN
20.0000 g | Freq: Once | ORAL | Status: AC
  Administered 2011-08-24: 20 g via ORAL
  Filled 2011-08-24: qty 30

## 2011-08-24 NOTE — Progress Notes (Signed)
CARDIAC REHAB PHASE I   PRE:  Rate/Rhythm: 88 SR    BP: sitting 114/69    SaO2: 91-95 RA  MODE:  Ambulation: 400 ft   POST:  Rate/Rhythm: 103 ST    BP: sitting 102/49     SaO2: 91 RA  Tolerated fairly well. Just c/o knee pain. Return to chair. Encouraged IS as SaO2 is lower today. [phone]  Harriet Masson CES, ACSM

## 2011-08-24 NOTE — Progress Notes (Signed)
    No new cardiac issues.  Awaiting ortho consult for swollen, hot right knee.  Otherwise ready to do home.   Vesta Mixer, Montez Hageman., MD, [facility] 08/24/2011, 10:17 AM Office - [phone] Pager [phone]

## 2011-08-24 NOTE — Progress Notes (Signed)
Pt ambulated approximately 650 feet x1 assist with walker. Still with pain in right knee, able to place foot more flat to floor at end of walk.  Pt back in room in chair call bell in reach will continue tomonitor patient. Analleli Gierke, Randall An RN

## 2011-08-24 NOTE — Progress Notes (Addendum)
6 Days Post-Op Procedure(s) (LRB): CORONARY ARTERY BYPASS GRAFTING (CABG) (N/A)  Subjective: Patient ambulated four times yesterday despite fair amount of right knee pain. No bowel movement yet.  Objective: Vital signs in last 24 hours: Patient Vitals for the past 24 hrs:  BP Temp Temp src Pulse Resp SpO2 Weight  08/24/11 0621 - - - - - - 133 lb 2.5 oz (60.4 kg)  08/23/11 2018 108/72 mmHg 98.6 F (37 C) Oral 95  18  95 % -  08/23/11 1551 - 99.1 F (37.3 C) - - - - -  08/23/11 1338 111/69 mmHg 100.8 F (38.2 C) Oral 95  19  91 % -  08/23/11 0956 126/64 mmHg - - 104  - - -   Pre op weight 61.2 kg Current Weight  08/24/11 133 lb 2.5 oz (60.4 kg)      Intake/Output from previous day: 07/08 0701 - 07/09 0700 In: 603 [P.O.:600; I.V.:3] Out: 1350 [Urine:1350]   Physical Exam:  Cardiovascular: RRR, no murmurs, gallops, or rubs. Pulmonary: Clear to auscultation bilaterally; no rales, wheezes, or rhonchi. Abdomen: Soft, non tender, bowel sounds present. Extremities: Trace bilateral lower extremity edema.Right knee is very warm and swollen medially. Wounds: Clean and dry.  No erythema or signs of infection.  Lab Results: CBC:No results found for this basename: WBC:2,HGB:2,HCT:2,PLT:2 in the last 72 hours BMET: No results found for this basename: NA:2,K:2,CL:2,CO2:2,GLUCOSE:2,BUN:2,CREATININE:2,CALCIUM:2 in the last 72 hours   Assessment/Plan:  1. CV - SR. Continue Lopressor 25 bid. 2.  Pulmonary - Encourage incentive spirometer. 3. Volume Overload - Continue with diuresis. 4.  Acute blood loss anemia - Last H and H stable at 9 and 27.9. 5.Right knee pain uric acid was 4.7. Will need ortho evaluation. 6.LOC for constipation. 7.Possible discharge in 1-2 days.   ZIMMERMAN,DONIELLE MPA-C 08/24/2011 7:24 AM   Increasing effusion rt knee ortho to see I have seen and examined Yisroel Ramming and agree with the above assessment  and plan.  Delight Ovens MD Beeper  [phone] Office [phone] 08/24/2011 5:50 PM

## 2011-08-25 MED ORDER — POLYETHYLENE GLYCOL 3350 17 G PO PACK
17.0000 g | PACK | Freq: Once | ORAL | Status: AC
  Administered 2011-08-25: 17 g via ORAL
  Filled 2011-08-25: qty 1

## 2011-08-25 MED ORDER — TRAMADOL HCL 50 MG PO TABS: 50.0000 mg | ORAL_TABLET | ORAL | Status: DC | PRN

## 2011-08-25 MED ORDER — MAGNESIUM CITRATE PO SOLN
1.0000 | Freq: Once | ORAL | Status: AC
  Administered 2011-08-25: 1 via ORAL
  Filled 2011-08-25: qty 296

## 2011-08-25 MED ORDER — TRAMADOL HCL 50 MG PO TABS: 50.0000 mg | ORAL_TABLET | ORAL | Status: AC | PRN

## 2011-08-25 MED ORDER — COLCHICINE 0.6 MG PO TABS: 0.6000 mg | ORAL_TABLET | Freq: Two times a day (BID) | ORAL | Status: DC | PRN

## 2011-08-25 NOTE — Progress Notes (Signed)
Pt up ambulating in hallway with husband and rolling walker; will cont. To monitor.

## 2011-08-25 NOTE — Progress Notes (Signed)
Pt ambulated 550 feet in hallway with RN and rolling walker without difficulty; pt to d/c home today one pt has BM; Miralax given at this time; will cont. To monitor.

## 2011-08-25 NOTE — Progress Notes (Addendum)
7 Days Post-Op Procedure(s) (LRB): CORONARY ARTERY BYPASS GRAFTING (CABG) (N/A)  Subjective: Patient has not had bowel movement yet. Right knee is feeling much better and she is able to ambulate without difficulty.  Objective: Vital signs in last 24 hours: Patient Vitals for the past 24 hrs:  BP Temp Temp src Pulse Resp SpO2 Weight  08/25/11 0300 123/75 mmHg 98.2 F (36.8 C) Oral 103  18  95 % 132 lb 11.5 oz (60.2 kg)  08/24/11 2052 99/62 mmHg 98.8 F (37.1 C) Oral 94  18  94 % -  08/24/11 1358 108/69 mmHg 98.7 F (37.1 C) Oral 83  18  97 % -  08/24/11 1032 128/63 mmHg - - 107  - - -   Pre op weight 61.2 kg Current Weight  08/25/11 132 lb 11.5 oz (60.2 kg)     Intake/Output from previous day: 07/09 0701 - 07/10 0700 In: 1080 [P.O.:1080] Out: -    Physical Exam:  Cardiovascular: RRR, no murmurs, gallops, or rubs. Pulmonary: Clear to auscultation bilaterally; no rales, wheezes, or rhonchi. Abdomen: Soft, non tender, bowel sounds present. Extremities: Trace bilateral lower extremity edema.Right knee longer warm or with swelling. Wounds: Clean and dry.  No erythema or signs of infection.  Lab Results: CBC:No results found for this basename: WBC:2,HGB:2,HCT:2,PLT:2 in the last 72 hours BMET: No results found for this basename: NA:2,K:2,CL:2,CO2:2,GLUCOSE:2,BUN:2,CREATININE:2,CALCIUM:2 in the last 72 hours   Assessment/Plan:  1. CV - SR. Continue Lopressor 25 bid. 2.  Pulmonary - Encourage incentive spirometer. 3. Patient now below pre op weight. Will not continue lasix upon discharge. 4.  Acute blood loss anemia - Last H and H stable at 9 and 27.9. 5.Right knee pain- uric acid was 4.7. Ortho evaluated yesterday. Etiology likely secondary to gout. 6.LOC for constipation. 7.Likely discharge later today.   ZIMMERMAN,DONIELLE MPA-C 08/25/2011 7:57 AM    I have seen and examined the patient and agree with the assessment and plan as outlined.  Jisell Majer  H 08/25/2011 9:53 AM

## 2011-08-25 NOTE — Progress Notes (Signed)
Pt ambulated 550 feet in hallway with husband and rolling walker; Mag Citrate ordered; will administer when available from pharmacy; pt made aware; pt cannot discharge until she has BM per MD and PA.

## 2011-08-25 NOTE — Progress Notes (Signed)
1350 Offered to walk with pt. Wants to walk with husband who is to be here in few minutes. Voiced no questions about d/c ed. States foot no longer hurting. Channah Godeaux DunlapRN

## 2011-08-25 NOTE — Progress Notes (Signed)
Pt given Miralax earlier and Dulcolax suppository; still no BM; pt just now given warmed prune juice to drink;will cont. To monitor; d/c home pending BM

## 2011-08-25 NOTE — Consult Note (Signed)
Reason for Consult: Right knee pain Referring Physician:  [name], [name] [name] is an 66 y.o. female.  HPI: 66 yo female with recent bypass surgery from which she is doing well.  She had recent onset of right knee pain that progressively worsened over a couple days to the point where she found it difficult to get around or bear weight.  No history of gout however empiric treatment with colchicine has made significant improvement in her condition.  At time of my evaluation she had only minimal discomfort as compared to previous days.  Otherwise no history involving the right knee  Past Medical History  Diagnosis Date  . Breast cancer   . Interstitial cystitis   . Hyperlipidemia   . Frequent UTI     on prophylaxis  . 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 Anginal pain     lower Sterum  . Heart murmur     Dr Carney Harder  . Shortness of breath     with Activity  . Constipation   . Arthritis     Spine    Past Surgical History  Procedure Date  . Breast lumpectomy 04/2009    left  . Partial hysterectomy 1976    vaginal bleeding after 2nd child born  . Appendectomy 1976  . Cesarean section     x 2  . Muscle release     L neck and chest.  . Coronary artery bypass graft 08/18/2011    Procedure: CORONARY ARTERY BYPASS GRAFTING (CABG);  Surgeon: Alleen Borne, MD;  Location: [facility] OR;  Service: Open Heart Surgery;  Laterality: N/A;  Coronary Artery Bypass Graft times five utilizing the left internal mammary artery and the left greater saphenous vein harvested endoscopically.    Family History  Problem Relation Age of Onset  . Cancer Mother 36    breast cancer  . Heart disease Father 70    MI at 40, CABG in 50's  . Hepatitis Father     C from blood transfusion  . Heart disease Brother     CABG in 92's  . Heart disease Paternal Aunt   . Heart disease Paternal Uncle   . Heart disease Paternal Grandfather   . Diabetes Neg Hx     Social History:  reports that she quit smoking  about 10 years ago. She has never used smokeless tobacco. She reports that she drinks about 4.2 ounces of alcohol per week. She reports that she does not use illicit drugs.  Allergies:  Allergies  Allergen Reactions  . Codeine Nausea And Vomiting       . Other Swelling    All Lip moisturizers except Vaseline.  . Phenothiazines Other (See Comments)    Makes her stop breathing.  . Talwin (Pentazocine) Nausea And Vomiting  . Yellow Jacket Venom     Medications:  I have reviewed the patient's current medications. Scheduled:   . aspirin EC  325 mg Oral Daily  . atorvastatin  20 mg Oral q1800  . bisacodyl  10 mg Oral Daily   Or  . bisacodyl  10 mg Rectal Daily  . docusate sodium  200 mg Oral Daily  . furosemide  40 mg Oral Daily  . lactulose  20 g Oral Once  . metoprolol tartrate  25 mg Oral BID  . nitrofurantoin  100 mg Oral Daily  . oxybutynin  10 mg Oral Daily  . pantoprazole  40 mg Oral Q1200  . potassium chloride  20  mEq Oral Daily  . sodium chloride  3 mL Intravenous Q12H  . tamoxifen  20 mg Oral Daily  . DISCONTD: lactulose  20 g Oral Once    No results found for this or any previous visit (from the past 24 hour(s)).   X-ray: None ordered based on evaluation.  ROS: pertinent only for right lower extremity pain, otherwise current hospitalization records reviewed  Blood pressure 123/75, pulse 103, temperature 98.2 F (36.8 C), temperature source Oral, resp. rate 18, height 5\' 6"  (1.676 m), weight 60.2 kg (132 lb 11.5 oz), SpO2 95.00%.  Exam: Very pleasant female in no acute distress, seeming quite comfortable despite recent surgery Right leg incisions from graft harvest sites clean without erythema Right knee with no erythema, minimal warmth and no significant effusion, small at best She tolerates knee ROM, passive and active without pain at this point  No problems comparable on the left knee  O/W NVI bilateral LE   Assessment/Plan: Acute presentation of  right knee pain in setting of recent surgery in addition to response to 2 doses of colchicine point to acute gouty attack involving this right knee (despite normal uric acid level).  As she is currently asymptomatic without effusion no aspiration possible or required.  Would recommend discharging with some colchicine or equivalent for daily use as needed. No need for acute treatment of uric acid level.  Can follow up with me at anytime for recurrence of right knee pain.  [name] [name], [facility], [phone].  [name] [name] D 08/25/2011, 7:13 AM

## 2011-08-27 ENCOUNTER — Ambulatory Visit (INDEPENDENT_AMBULATORY_CARE_PROVIDER_SITE_OTHER): Payer: Self-pay | Admitting: *Deleted

## 2011-08-27 DIAGNOSIS — Z09 Encounter for follow-up examination after completed treatment for conditions other than malignant neoplasm: Secondary | ICD-10-CM

## 2011-08-27 DIAGNOSIS — S30821A Blister (nonthermal) of abdominal wall, initial encounter: Secondary | ICD-10-CM

## 2011-08-27 DIAGNOSIS — I2510 Atherosclerotic heart disease of native coronary artery without angina pectoris: Secondary | ICD-10-CM

## 2011-08-27 DIAGNOSIS — S2092XA Blister (nonthermal) of unspecified parts of thorax, initial encounter: Secondary | ICD-10-CM

## 2011-08-27 NOTE — Progress Notes (Unsigned)
[name] [name]  returns with concerns of an area near one of her ct sites.  I removed the steri strips from all the sites.  As I removed the strips from the ct site of concern, a large blister burst.  I trimmed the tissue from the blister, cleaned the area with normal saline, applied silvadene and a telfa dressing.  I instructed her to do this for the next few days and observe for any signs or symptoms of infection.  She will return as scheduled.

## 2011-09-01 ENCOUNTER — Encounter: Payer: Self-pay | Admitting: Surgery

## 2011-09-03 ENCOUNTER — Encounter: Payer: Self-pay | Admitting: Cardiovascular Disease

## 2011-09-03 ENCOUNTER — Ambulatory Visit (INDEPENDENT_AMBULATORY_CARE_PROVIDER_SITE_OTHER): Payer: BC Managed Care – PPO | Admitting: Cardiovascular Disease

## 2011-09-03 VITALS — BP 138/86 | HR 91 | Ht 66.0 in | Wt 131.0 lb

## 2011-09-03 DIAGNOSIS — E785 Hyperlipidemia, unspecified: Secondary | ICD-10-CM

## 2011-09-03 DIAGNOSIS — I2510 Atherosclerotic heart disease of native coronary artery without angina pectoris: Secondary | ICD-10-CM

## 2011-09-03 MED ORDER — METOPROLOL SUCCINATE ER 50 MG PO TB24: 50.0000 mg | ORAL_TABLET | Freq: Every day | ORAL | Status: DC

## 2011-09-03 NOTE — Progress Notes (Signed)
Yisroel Ramming Date of Birth  07-Nov-1945       [facility]    [place] [address], [address]  [address] [place], [place]  [address]   [place], [place]  [address] [phone]     [phone]   Fax  [phone]    Fax [phone]  Problem List: 1. CAD- CABG 2. Breast cancer - on Tamoxifin  3. Hyperlipidemia  4. Interstitial cystitis  History of Present Illness:  Kathie Rhodes is doing very well.  She is progressing well 2 weeks after her CABG.  She is having some of the usual dyspnea with exertion but is ambulating without problems.  Current Outpatient Prescriptions on File Prior to Visit  Medication Sig Dispense Refill  . aspirin 325 MG EC tablet Take 1 tablet (325 mg total) by mouth daily.  30 tablet    . atorvastatin (LIPITOR) 20 MG tablet Take 20 mg by mouth daily.      . colchicine 0.6 MG tablet Take 1 tablet (0.6 mg total) by mouth 2 (two) times daily as needed.  12 tablet  0  . EPINEPHrine (EPIPEN) 0.3 mg/0.3 mL DEVI Inject 0.3 mg into the muscle once as needed. For allergic reactions.      . metoprolol succinate (TOPROL-XL) 25 MG 24 hr tablet Take 50 mg by mouth daily.      . nitrofurantoin (MACRODANTIN) 100 MG capsule Take 100 mg by mouth daily.       [name] oxybutynin (DITROPAN-XL) 10 MG 24 hr tablet Take 10 mg by mouth daily.      Bertram Gala Glycol-Propyl Glycol 0.4-0.3 % SOLN Apply 1 drop to eye daily as needed.    0  . tamoxifen (NOLVADEX) 20 MG tablet Take 20 mg by mouth daily.      . traMADol (ULTRAM) 50 MG tablet Take 1-2 tablets (50-100 mg total) by mouth every 4 (four) hours as needed for pain.  45 tablet  0    Allergies  Allergen Reactions  . Codeine Nausea And Vomiting       . Other Swelling    All Lip moisturizers except Vaseline.  . Phenothiazines Other (See Comments)    Makes her stop breathing.  . Talwin (Pentazocine) Nausea And Vomiting  . Yellow Jacket Venom     Past Medical History  Diagnosis Date  .  Breast cancer   . Interstitial cystitis   . Hyperlipidemia   . Frequent UTI     on prophylaxis  . 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 Anginal pain     lower Sterum  . Heart murmur     Dr Carney Harder  . Shortness of breath     with Activity  . Constipation   . Arthritis     Spine    Past Surgical History  Procedure Date  . Breast lumpectomy 04/2009    left  . Partial hysterectomy 1976    vaginal bleeding after 2nd child born  . Appendectomy 1976  . Cesarean section     x 2  . Muscle release     L neck and chest.  . Coronary artery bypass graft 08/18/2011    Procedure: CORONARY ARTERY BYPASS GRAFTING (CABG);  Surgeon: Alleen Borne, MD;  Location: [facility] OR;  Service: Open Heart Surgery;  Laterality: N/A;  Coronary Artery Bypass Graft times five utilizing the left internal mammary artery and the left greater saphenous vein harvested endoscopically.    History  Smoking  status  . Former Smoker  . Quit date: 02/15/2001  Smokeless tobacco  . Never Used    History  Alcohol Use  . 4.2 oz/week  . 7 Shots of liquor per week    Family History  Problem Relation Age of Onset  . Cancer Mother 52    breast cancer  . Heart disease Father 25    MI at 48, CABG in 71's  . Hepatitis Father     C from blood transfusion  . Heart disease Brother     CABG in 82's  . Heart disease Paternal Aunt   . Heart disease Paternal Uncle   . Heart disease Paternal Grandfather   . Diabetes Neg Hx     Reviw of Systems:  Reviewed in the HPI.  All other systems are negative.  Physical Exam: Blood pressure 138/86, pulse 91, height 5\' 6"  (1.676 m), weight 131 lb (59.421 kg). General: Well developed, well nourished, in no acute distress.  Head: Normocephalic, atraumatic, sclera non-icteric, mucus membranes are moist,   Neck: Supple. Carotids are 2 + without bruits. No JVD  Lungs: Clear bilaterally to auscultation.  Heart: regular rate.  normal  S1 S2. No murmurs, gallops or rubs.  Abdomen: Soft,  non-tender, non-distended with normal bowel sounds. No hepatomegaly. No rebound/guarding. No masses.  Msk:  Strength and tone are normal  Extremities: No clubbing or cyanosis. No edema.  Distal pedal pulses are 2+ and equal bilaterally.  Neuro: Alert and oriented X 3. Moves all extremities spontaneously.  Psych:  Responds to questions appropriately with a normal affect.  ECG:  Assessment / Plan:

## 2011-09-03 NOTE — Assessment & Plan Note (Signed)
She is doing well and is making great progress following her CABG.   Will encourage her to ambulate and to participate in cardiac rehab.  I will see her in 3 months.

## 2011-09-03 NOTE — Patient Instructions (Signed)
Your physician recommends that you schedule a follow-up appointment in: 3 MONTHS    Your physician recommends that you return for a FASTING lipid profile: 3 MONTHS  

## 2011-09-15 ENCOUNTER — Other Ambulatory Visit: Payer: Self-pay | Admitting: Surgery

## 2011-09-15 DIAGNOSIS — I2510 Atherosclerotic heart disease of native coronary artery without angina pectoris: Secondary | ICD-10-CM

## 2011-09-21 ENCOUNTER — Ambulatory Visit: Payer: BC Managed Care – PPO | Admitting: Surgery

## 2011-09-21 ENCOUNTER — Ambulatory Visit
Admission: RE | Admit: 2011-09-21 | Discharge: 2011-09-21 | Disposition: A | Discharge status: Home / Self Care | Payer: BC Managed Care – PPO | Source: Ambulatory Visit | Attending: Surgery | Admitting: Surgery

## 2011-09-21 DIAGNOSIS — I2510 Atherosclerotic heart disease of native coronary artery without angina pectoris: Secondary | ICD-10-CM

## 2011-09-22 ENCOUNTER — Ambulatory Visit: Payer: BC Managed Care – PPO | Admitting: Surgery

## 2011-09-22 ENCOUNTER — Ambulatory Visit (INDEPENDENT_AMBULATORY_CARE_PROVIDER_SITE_OTHER): Payer: Self-pay | Admitting: Surgery

## 2011-09-22 ENCOUNTER — Encounter: Payer: Self-pay | Admitting: Surgery

## 2011-09-22 VITALS — BP 120/76 | HR 88 | Resp 18 | Ht 66.0 in | Wt 136.0 lb

## 2011-09-22 DIAGNOSIS — I2510 Atherosclerotic heart disease of native coronary artery without angina pectoris: Secondary | ICD-10-CM

## 2011-09-22 DIAGNOSIS — Z951 Presence of aortocoronary bypass graft: Secondary | ICD-10-CM

## 2011-09-23 ENCOUNTER — Encounter: Payer: Self-pay | Admitting: Surgery

## 2011-09-23 NOTE — Progress Notes (Signed)
                   [address]            [name] [address]          [phone]     HPI:  Patient returns for routine postoperative follow-up having undergone  Coronary bypass graft surgery x5 on 08/18/2011. 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 This was resolved with colchicine. Since hospital discharge the patient reports she is making good progress. She is walking at least 1/2 mile twice per day and has remained active otherwise. [name] had no further problems with gout in her right knee.   Current Outpatient Prescriptions  Medication Sig Dispense Refill  . aspirin 325 MG EC tablet Take 1 tablet (325 mg total) by mouth daily.  30 tablet    . atorvastatin (LIPITOR) 20 MG tablet Take 20 mg by mouth daily.      . colchicine 0.6 MG tablet Take 1 tablet (0.6 mg total) by mouth 2 (two) times daily as needed.  12 tablet  0  . EPINEPHrine (EPIPEN) 0.3 mg/0.3 mL DEVI Inject 0.3 mg into the muscle once as needed. For allergic reactions.      . metoprolol succinate (TOPROL-XL) 50 MG 24 hr tablet Take 1 tablet (50 mg total) by mouth daily.  90 tablet  1  . nitrofurantoin (MACRODANTIN) 100 MG capsule Take 100 mg by mouth daily.       . nitrofurantoin, macrocrystal-monohydrate, (MACROBID) 100 MG capsule Take 100 mg by mouth 2 (two) times daily.       [name] oxybutynin (DITROPAN-XL) 10 MG 24 hr tablet Take 10 mg by mouth daily.      Bertram Gala Glycol-Propyl Glycol 0.4-0.3 % SOLN Apply 1 drop to eye daily as needed.    0  . tamoxifen (NOLVADEX) 20 MG tablet Take 20 mg by mouth daily.        Physical Exam: BP 120/76  Pulse 88  Resp 18  Ht 5\' 6"  (1.676 m)  Wt 136 lb (61.689 kg)  BMI 21.95 kg/m2  SpO2 96% She looks well. Cardiac exam shows regular rate and rhythm with normal heart sounds. Lung exam is clear. Chest incision is healing well and sternum  stable. The right leg incisions healing well and there is no peripheral edema.  Diagnostic Tests:  *RADIOLOGY REPORT*   Clinical Data: Status post CABG on July 2013, follow-up   CHEST - 2 VIEW   Comparison: Chest x-ray of 08/21/2011   Findings: Aeration of the lungs has improved.  Small pleural effusions remain but both have decreased in size.  Heart size is stable.  Median sternotomy sutures are noted from CABG.   IMPRESSION: Improved aeration with decrease in basilar atelectasis and small effusions.   Original Report Authenticated By: Juline Patch, M.D.   Impression: Overall she is making a good recovery following her surgery. I encouraged her to continue walking as much as possible. Her and her husband are very motivated to remain active. I told her she can return to driving a car at this time but should refrain from lifting anything heavier than 10 pounds for a total of 3 months date of surgery.    Plan:  She will continue followup with Dr. Elease Hashimoto and will contact me if she develops any problems with her incisions.

## 2011-09-30 ENCOUNTER — Other Ambulatory Visit (INDEPENDENT_AMBULATORY_CARE_PROVIDER_SITE_OTHER): Payer: BC Managed Care – PPO

## 2011-09-30 ENCOUNTER — Ambulatory Visit (HOSPITAL_BASED_OUTPATIENT_CLINIC_OR_DEPARTMENT_OTHER): Payer: BC Managed Care – PPO | Admitting: Oncology

## 2011-09-30 ENCOUNTER — Encounter: Payer: Self-pay | Admitting: Oncology

## 2011-09-30 ENCOUNTER — Other Ambulatory Visit: Payer: BC Managed Care – PPO | Admitting: Lab

## 2011-09-30 ENCOUNTER — Telehealth: Payer: Self-pay | Admitting: Oncology

## 2011-09-30 VITALS — BP 145/75 | HR 88 | Temp 98.4°F | Resp 20 | Ht 66.0 in | Wt 137.5 lb

## 2011-09-30 DIAGNOSIS — D0590 Unspecified type of carcinoma in situ of unspecified breast: Secondary | ICD-10-CM

## 2011-09-30 DIAGNOSIS — Z23 Encounter for immunization: Secondary | ICD-10-CM

## 2011-09-30 DIAGNOSIS — C50919 Malignant neoplasm of unspecified site of unspecified female breast: Secondary | ICD-10-CM

## 2011-09-30 DIAGNOSIS — I2510 Atherosclerotic heart disease of native coronary artery without angina pectoris: Secondary | ICD-10-CM

## 2011-09-30 LAB — CBC WITH DIFFERENTIAL/PLATELET
Basophils Absolute: 0.1 10*3/uL (ref 0.0–0.1)
EOS%: 3.7 % (ref 0.0–7.0)
HCT: 34.8 % (ref 34.8–46.6)
HGB: 11.1 g/dL — ABNORMAL LOW (ref 11.6–15.9)
LYMPH%: 21.6 % (ref 14.0–49.7)
MCH: 28.7 pg (ref 25.1–34.0)
MCHC: 31.8 g/dL (ref 31.5–36.0)
NEUT%: 67.6 % (ref 38.4–76.8)
Platelets: 359 10*3/uL (ref 145–400)
lymph#: 1.6 10*3/uL (ref 0.9–3.3)

## 2011-09-30 LAB — COMPREHENSIVE METABOLIC PANEL
AST: 16 U/L (ref 0–37)
BUN: 18 mg/dL (ref 6–23)
CO2: 27 mEq/L (ref 19–32)
Calcium: 9 mg/dL (ref 8.4–10.5)
Chloride: 106 mEq/L (ref 96–112)
Creatinine, Ser: 0.62 mg/dL (ref 0.50–1.10)
Total Bilirubin: 0.2 mg/dL — ABNORMAL LOW (ref 0.3–1.2)

## 2011-09-30 NOTE — Patient Instructions (Addendum)
1. Tamoxifen daily  2. Return in 6 months for follow up

## 2011-09-30 NOTE — Progress Notes (Signed)
OFFICE PROGRESS NOTE  CC  KNAPP,[name], MD [address] [place] [address] Dr. Emelia Loron Dr. 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  PRIOR THERAPY: 1. S/P left breast lumpectomy in March 2011 after she had a screen detected 1.0 cm ER+, PR+, intermediate grade DCIS. Patient had 3 sentinel biopsied all of them were negative for metastatic disease  2. S/P radiation therapy administered between June 05, 2009 - Jul 02, 2009  3. Tamoxifen 20 mg since August 2011.     CURRENT THERAPY:Tamoxifen 20 mg po daily  INTERVAL HISTORY: [name] [name] 66 y.o. female returns for follow up visit today. Her in terms history is remarkable for her having just recently undergone open heart surgery for severe coronary artery disease. Patient tells me that in may June she started feeling chest pressure bilateral upper extremity pain and shoulder pain. Because of this she was seen by her primary care physician as well as cardiology cardiac catheterization was performed that showed 5 vessel disease. She went on to have a coronary artery bypass graft on 08/18/2011. Overall she tolerated the procedure well. She had no real complications. She overall now feels well all of her pain is gone her fatigue is significantly improved. Throughout the patient was continuing to take her tamoxifen and she has no problems with that at all. She has not had any problems with vaginal bleeding no aches or pains no nausea or vomiting. Remainder of the 10 point review of systems is negative. MEDICAL HISTORY: Past Medical History  Diagnosis Date  . Breast cancer   . Interstitial cystitis   . Hyperlipidemia   . Frequent UTI     on prophylaxis  . 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 Anginal pain     lower Sterum  . Heart murmur   . Shortness of breath     with Activity  . Constipation   . Arthritis     Spine    ALLERGIES:  is allergic  to codeine; other; phenothiazines; talwin; and yellow jacket venom.  MEDICATIONS:  Current Outpatient Prescriptions  Medication Sig Dispense Refill  . aspirin 325 MG EC tablet Take 1 tablet (325 mg total) by mouth daily.  30 tablet    . atorvastatin (LIPITOR) 20 MG tablet Take 20 mg by mouth daily.      . colchicine 0.6 MG tablet Take 1 tablet (0.6 mg total) by mouth 2 (two) times daily as needed.  12 tablet  0  . EPINEPHrine (EPIPEN) 0.3 mg/0.3 mL DEVI Inject 0.3 mg into the muscle once as needed. For allergic reactions.      . metoprolol succinate (TOPROL-XL) 50 MG 24 hr tablet Take 1 tablet (50 mg total) by mouth daily.  90 tablet  1  . nitrofurantoin, macrocrystal-monohydrate, (MACROBID) 100 MG capsule Take 100 mg by mouth 2 (two) times daily.       [name] oxybutynin (DITROPAN-XL) 10 MG 24 hr tablet Take 10 mg by mouth daily.      Bertram Gala Glycol-Propyl Glycol 0.4-0.3 % SOLN Apply 1 drop to eye daily as needed.    0  . tamoxifen (NOLVADEX) 20 MG tablet Take 20 mg by mouth daily.      . nitrofurantoin (MACRODANTIN) 100 MG capsule Take 100 mg by mouth daily.         SURGICAL HISTORY:  Past Surgical History  Procedure Date  . Breast lumpectomy 04/2009  left  . Partial hysterectomy 1976    vaginal bleeding after 2nd child born  . Appendectomy 1976  . Cesarean section     x 2  . Muscle release     L neck and chest.  . Coronary artery bypass graft 08/18/2011    Procedure: CORONARY ARTERY BYPASS GRAFTING (CABG);  Surgeon: Alleen Borne, MD;  Location[facility] OR;  Service: Open Heart Surgery;  Laterality: N/A;  Coronary Artery Bypass Graft times five utilizing the left internal mammary artery and the left greater saphenous vein harvested endoscopically.    REVIEW OF SYSTEMS:  Pertinent items are noted in HPI.   PHYSICAL EXAMINATION: General appearance: alert, cooperative, appears stated age and no distress Head: Normocephalic, without obvious abnormality, atraumatic Neck: no adenopathy, no  carotid bruit, no JVD, supple, symmetrical, trachea midline and thyroid not enlarged, symmetric, no tenderness/mass/nodules Lymph nodes: Cervical, supraclavicular, and axillary nodes normal. Resp: clear to auscultation bilaterally and normal percussion bilaterally Back: symmetric, no curvature. ROM normal. No CVA tenderness. Cardio: regular rate and rhythm, S1, S2 normal, no murmur, click, rub or gallop and normal apical impulse GI: soft, non-tender; bowel sounds normal; no masses,  no organomegaly Extremities: extremities normal, atraumatic, no cyanosis or edema Neurologic: Alert and oriented X 3, normal strength and tone. Normal symmetric reflexes. Normal coordination and gait Bilateral Breast Exam: no masses or nipple discharge, no skin changes, left breast has a well healaed scar, no nodularity or masses. ECOG PERFORMANCE STATUS: 0 - Asymptomatic  Blood pressure 145/75, pulse 88, temperature 98.4 F (36.9 C), temperature source Oral, resp. rate 20, height 5\' 6"  (1.676 m), weight 137 lb 8 oz (62.37 kg).  LABORATORY DATA: Lab Results  Component Value Date   WBC 7.2 09/30/2011   HGB 11.1* 09/30/2011   HCT 34.8 09/30/2011   MCV 90.1 09/30/2011   PLT 359 09/30/2011      Chemistry      Component Value Date/Time   NA 135 08/21/2011 0603   K 4.1 08/21/2011 0603   CL 103 08/21/2011 0603   CO2 23 08/21/2011 0603   BUN 15 08/21/2011 0603   CREATININE 0.57 08/21/2011 0603      Component Value Date/Time   CALCIUM 8.1* 08/21/2011 0603   ALKPHOS 54 08/16/2011 1553   AST 20 08/16/2011 1553   ALT 11 08/16/2011 1553   BILITOT 0.2* 08/16/2011 1553       RADIOGRAPHIC STUDIES:  No results found.  ASSESSMENT: 66 year old female with: 1. DCIS of left breast cancer s/p lumpectomy and sentinel node biopsy 2. S/p radiation therapy 3. Now on adjuvant tamoxifen tolerating well 4. No evidence of recurrent diease 5. Mammograms up to date 6.Status post coronary artery bypass graft on 08/18/2011   PLAN:   1.  Continue tamoxifen 2. Refills prescriptions as needed 3. Follow up in 6 months    All questions were answered. The patient knows to call the clinic with any problems, questions or concerns. We can certainly see the patient much sooner if necessary.  I spent 20 minutes counseling the patient face to face. The total time spent in the appointment was 30 minutes.    Drue Second, MD Medical/Oncology [facility] [phone] (beeper) [phone] (Office)  09/30/2011, 1:49 PM

## 2011-09-30 NOTE — Telephone Encounter (Signed)
gve the pt her march 2014 appt calendar °

## 2011-10-01 ENCOUNTER — Telehealth: Payer: Self-pay | Admitting: Family Medicine

## 2011-10-01 NOTE — Telephone Encounter (Signed)
PATIENT CALLED IN INQUIRING ABOUT HER PNEUMONIA VACCINATION THAT SHE HAD RECEIVED. SHE STATES THAT IT WAS HURTING. IT WAS NOT RED, SWOLLEN OR HOT TO THE TOUCH. I EXPLAIN TO THE PATIENT TO PUT WARM COMPRESSION ON IT AND SHE CAN TAKE IBUPROFEN OR TYLENOL TO HELP WITH PAIN. CLS

## 2011-10-06 ENCOUNTER — Other Ambulatory Visit: Payer: BC Managed Care – PPO

## 2011-10-07 ENCOUNTER — Other Ambulatory Visit: Payer: BC Managed Care – PPO | Admitting: Lab

## 2011-10-07 ENCOUNTER — Ambulatory Visit: Payer: BC Managed Care – PPO | Admitting: Family

## 2011-11-10 ENCOUNTER — Other Ambulatory Visit (INDEPENDENT_AMBULATORY_CARE_PROVIDER_SITE_OTHER): Payer: BC Managed Care – PPO

## 2011-11-10 DIAGNOSIS — Z23 Encounter for immunization: Secondary | ICD-10-CM

## 2011-11-29 ENCOUNTER — Telehealth: Payer: Self-pay | Admitting: Cardiovascular Disease

## 2011-11-29 ENCOUNTER — Ambulatory Visit (INDEPENDENT_AMBULATORY_CARE_PROVIDER_SITE_OTHER): Payer: BC Managed Care – PPO | Admitting: Nurse Practitioner

## 2011-11-29 ENCOUNTER — Other Ambulatory Visit: Payer: Self-pay | Admitting: Nurse Practitioner

## 2011-11-29 ENCOUNTER — Encounter (HOSPITAL_COMMUNITY): Payer: Self-pay | Admitting: Pharmacy Technician

## 2011-11-29 ENCOUNTER — Encounter: Payer: Self-pay | Admitting: *Deleted

## 2011-11-29 ENCOUNTER — Encounter: Payer: Self-pay | Admitting: Nurse Practitioner

## 2011-11-29 VITALS — BP 140/86 | HR 83 | Ht 66.0 in | Wt 138.8 lb

## 2011-11-29 DIAGNOSIS — Z01818 Encounter for other preprocedural examination: Secondary | ICD-10-CM

## 2011-11-29 DIAGNOSIS — R0789 Other chest pain: Secondary | ICD-10-CM

## 2011-11-29 DIAGNOSIS — I2510 Atherosclerotic heart disease of native coronary artery without angina pectoris: Secondary | ICD-10-CM

## 2011-11-29 DIAGNOSIS — Z01810 Encounter for preprocedural cardiovascular examination: Secondary | ICD-10-CM

## 2011-11-29 LAB — BASIC METABOLIC PANEL
BUN: 15 mg/dL (ref 6–23)
CO2: 27 mEq/L (ref 19–32)
Calcium: 8.9 mg/dL (ref 8.4–10.5)
Chloride: 104 mEq/L (ref 96–112)
Creatinine, Ser: 0.8 mg/dL (ref 0.4–1.2)
GFR: 77.57 mL/min (ref >60.00)
Glucose, Bld: 101 mg/dL — ABNORMAL HIGH (ref 70–99)
Potassium: 4.6 mEq/L (ref 3.5–5.1)
Sodium: 139 mEq/L (ref 135–145)

## 2011-11-29 LAB — CBC WITH DIFFERENTIAL/PLATELET
Basophils Absolute: 0 10*3/uL (ref 0.0–0.1)
Basophils Relative: 0.4 % (ref 0.0–3.0)
Eosinophils Absolute: 0 10*3/uL (ref 0.0–0.7)
Eosinophils Relative: 0.4 % (ref 0.0–5.0)
HCT: 39.1 % (ref 36.0–46.0)
Hemoglobin: 12.4 g/dL (ref 12.0–15.0)
Lymphocytes Relative: 14.7 % (ref 12.0–46.0)
Lymphs Abs: 1.7 10*3/uL (ref 0.7–4.0)
MCHC: 31.6 g/dL (ref 30.0–36.0)
MCV: 88.2 fl (ref 78.0–100.0)
Monocytes Absolute: 0.8 10*3/uL (ref 0.1–1.0)
Monocytes Relative: 7 % (ref 3.0–12.0)
Neutro Abs: 8.9 10*3/uL — ABNORMAL HIGH (ref 1.4–7.7)
Neutrophils Relative %: 77.5 % — ABNORMAL HIGH (ref 43.0–77.0)
Platelets: 386 10*3/uL (ref 150.0–400.0)
RBC: 4.44 Mil/uL (ref 3.87–5.11)
RDW: 15.8 % — ABNORMAL HIGH (ref 11.5–14.6)
WBC: 11.5 10*3/uL — ABNORMAL HIGH (ref 4.5–10.5)

## 2011-11-29 LAB — PROTIME-INR
INR: 1.1 ratio — ABNORMAL HIGH (ref 0.8–1.0)
Prothrombin Time: 11.8 s (ref 10.2–12.4)

## 2011-11-29 LAB — APTT: aPTT: 24.4 s (ref 21.7–28.8)

## 2011-11-29 MED ORDER — NITROGLYCERIN 0.4 MG SL SUBL: 0.4000 mg | SUBLINGUAL_TABLET | SUBLINGUAL | Status: DC | PRN

## 2011-11-29 NOTE — Patient Instructions (Addendum)
We need to arrange for a heart catheterization for Wednesday.  I am sending a prescription for NTG to the drug store. Use your NTG under your tongue for recurrent chest pain. May take one tablet every 5 minutes. If you are still having discomfort after 3 tablets in 15 minutes, call 911.   You are scheduled for a cardiac catheterization on Wednesday, October 16 with Dr. [place] or associate.  Go to [facility] 2nd Floor Short Stay on Wednesday at October 16th at 6:30am . No food or drink after midnight on Tuesday. You may take your medications with a sip of water on the day of your procedure.  Stay on your aspirin.  Limit your activities. If you have progression of your symptoms, go to the ER.

## 2011-11-29 NOTE — Progress Notes (Signed)
 [name] [name] Date of Birth: 12/06/1945 Medical Record #[id number]  History of Present Illness: [name] [name] is seen today for a work in visit. She is seen for Dr. Nahser. She has a history of CAD with recent CABG x 5 back in July. Her other issues are as noted below.   She comes in today. She is here alone. She notes that she has not felt well for almost a week. She is feeling exactly like she did back in July prior to her surgery (except for the jaw pain). She is lethargic. She has bilateral arm pain down to her elbows. She has been short of breath and coughing. She feels like there is pressure in her chest. She has also had some jaw pain and went to see her dentist. That evaluation was ok. All of her symptoms are worse with exertion. She has had to stop walking. No NTG use. Was told to throw it away following her surgery. No fever or chills. No sputum. This is identical but not as severe as to what she was having prior to her surgery.   Current Outpatient Prescriptions on File Prior to Visit  Medication Sig Dispense Refill  . aspirin 325 MG EC tablet Take 1 tablet (325 mg total) by mouth daily.  30 tablet    . atorvastatin (LIPITOR) 20 MG tablet Take 20 mg by mouth daily.      . colchicine 0.6 MG tablet Take 1 tablet (0.6 mg total) by mouth 2 (two) times daily as needed.  12 tablet  0  . EPINEPHrine (EPIPEN) 0.3 mg/0.3 mL DEVI Inject 0.3 mg into the muscle once as needed. For allergic reactions.      . metoprolol succinate (TOPROL-XL) 50 MG 24 hr tablet Take 1 tablet (50 mg total) by mouth daily.  90 tablet  1  . oxybutynin (DITROPAN-XL) 10 MG 24 hr tablet Take 10 mg by mouth daily.      . Polyethyl Glycol-Propyl Glycol 0.4-0.3 % SOLN Apply 1 drop to eye daily as needed.    0  . tamoxifen (NOLVADEX) 20 MG tablet Take 20 mg by mouth daily.        Allergies  Allergen Reactions  . Codeine Nausea And Vomiting       . Other Swelling    All Lip moisturizers except Vaseline.  .  Phenothiazines Other (See Comments)    Makes her stop breathing.  . Talwin (Pentazocine) Nausea And Vomiting  . Yellow Jacket Venom     Past Medical History  Diagnosis Date  . Breast cancer     on Tamoxifen  . Interstitial cystitis     on chronic antibiotics  . Hyperlipidemia   . Frequent UTI     on prophylaxis  . 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 Heart murmur   . Constipation   . Arthritis     Spine  . CAD (coronary artery disease)     s/p CABG x 5 August 18, 2011    Past Surgical History  Procedure Date  . Breast lumpectomy 04/2009    left  . Partial hysterectomy 1976    vaginal bleeding after 2nd child born  . Appendectomy 1976  . Cesarean section     x 2  . Muscle release     L neck and chest.  . Coronary artery bypass graft 08/18/2011    Procedure: CORONARY ARTERY BYPASS GRAFTING (CABG);  Surgeon: Bryan K Bartle, MD;  Location: [facility] OR;  Service: Open   Heart Surgery;  Laterality: N/A;  Coronary Artery Bypass Graft times five utilizing the left internal mammary artery and the left greater saphenous vein harvested endoscopically.    History  Smoking status  . Former Smoker  . Quit date: 02/15/2001  Smokeless tobacco  . Never Used    History  Alcohol Use  . 4.2 oz/week  . 7 Shots of liquor per week    Family History  Problem Relation Age of Onset  . Cancer Mother 47    breast cancer  . Heart disease Father 40    MI at 40, CABG in 60's  . Hepatitis Father     C from blood transfusion  . Heart disease Brother     CABG in 50's  . Heart disease Paternal Aunt   . Heart disease Paternal Uncle   . Heart disease Paternal Grandfather   . Diabetes Neg Hx     Review of Systems: The review of systems is per the HPI.  All other systems were reviewed and are negative.  Physical Exam: BP 140/86  Pulse 83  Ht 5' 6" (1.676 m)  Wt 138 lb 12.8 oz (62.959 kg)  BMI 22.40 kg/m2 Patient is very pleasant and in no acute distress. Skin is warm and dry. Color is normal.   HEENT is unremarkable. Normocephalic/atraumatic. PERRL. Sclera are nonicteric. Neck is supple. No masses. No JVD. Lungs are clear. Cardiac exam shows a regular rate and rhythm. Abdomen is soft. Extremities are without edema. Gait and ROM are intact. No gross neurologic deficits noted.   LABORATORY DATA: EKG today shows sinus rhythm. No acute changes. Tracing is reviewed with Dr. Nahser.   Lab Results  Component Value Date   WBC 7.2 09/30/2011   HGB 11.1* 09/30/2011   HCT 34.8 09/30/2011   PLT 359 09/30/2011   GLUCOSE 95 09/30/2011   CHOL 170 06/04/2011   TRIG 100 06/04/2011   HDL 42 06/04/2011   LDLCALC 108* 06/04/2011   ALT 9 09/30/2011   AST 16 09/30/2011   NA 141 09/30/2011   K 4.2 09/30/2011   CL 106 09/30/2011   CREATININE 0.62 09/30/2011   BUN 18 09/30/2011   CO2 27 09/30/2011   TSH 2.43 07/06/2011   INR 1.62* 08/18/2011   HGBA1C 5.9* 08/16/2011    Assessment / Plan:  Chest pain/bilateral arm pain/jaw pain - in the setting of known CAD with recent CABG x 5. Her symptoms are quite concerning for possible graft failure. Cardiac catheterization has been recommended. She is subsequently seen with Dr. Nahser. He is in agreement. Cardiac cath is planned with Dr. Jordan for Wednesday. The procedure, risks and benefits have been reviewed with her and she is willing to proceed. NTG was called in as well to the drug store. If she has any worsening of symptoms she is to go to the ER. Activities are to be limited in the meantime.  Patient is agreeable to this plan and will call if any problems develop in the interim.   

## 2011-11-29 NOTE — Telephone Encounter (Signed)
Pt has been seen by dentist and xrays all normal, appointment made today with Lawson Fiscal gerhardt np, c/o lethargy for 5 days, bilateral arm pain and right jaw pain off and on and " I just dont feel well", HX: CABG

## 2011-11-29 NOTE — Telephone Encounter (Signed)
Pt would like a return call  [phone], pt experiencing Jaw and arm pain, previous open heart surgery 08/18/11.

## 2011-12-01 ENCOUNTER — Encounter (HOSPITAL_COMMUNITY)
Admission: RE | Disposition: A | Discharge status: Home / Self Care | Payer: Self-pay | Source: Ambulatory Visit | Attending: Cardiology

## 2011-12-01 ENCOUNTER — Ambulatory Visit (HOSPITAL_COMMUNITY)
Admission: RE | Admit: 2011-12-01 | Discharge: 2011-12-01 | Disposition: A | Discharge status: Home / Self Care | Payer: BC Managed Care – PPO | Source: Ambulatory Visit | Attending: Cardiology | Admitting: Cardiology

## 2011-12-01 DIAGNOSIS — K5900 Constipation, unspecified: Secondary | ICD-10-CM | POA: Insufficient documentation

## 2011-12-01 DIAGNOSIS — Z01810 Encounter for preprocedural cardiovascular examination: Secondary | ICD-10-CM

## 2011-12-01 DIAGNOSIS — Z7982 Long term (current) use of aspirin: Secondary | ICD-10-CM | POA: Insufficient documentation

## 2011-12-01 DIAGNOSIS — I2510 Atherosclerotic heart disease of native coronary artery without angina pectoris: Secondary | ICD-10-CM | POA: Insufficient documentation

## 2011-12-01 DIAGNOSIS — M479 Spondylosis, unspecified: Secondary | ICD-10-CM | POA: Insufficient documentation

## 2011-12-01 DIAGNOSIS — I2582 Chronic total occlusion of coronary artery: Secondary | ICD-10-CM | POA: Insufficient documentation

## 2011-12-01 DIAGNOSIS — I25810 Atherosclerosis of coronary artery bypass graft(s) without angina pectoris: Secondary | ICD-10-CM | POA: Insufficient documentation

## 2011-12-01 DIAGNOSIS — R0602 Shortness of breath: Secondary | ICD-10-CM | POA: Insufficient documentation

## 2011-12-01 DIAGNOSIS — E785 Hyperlipidemia, unspecified: Secondary | ICD-10-CM | POA: Insufficient documentation

## 2011-12-01 DIAGNOSIS — N3010 Interstitial cystitis (chronic) without hematuria: Secondary | ICD-10-CM | POA: Insufficient documentation

## 2011-12-01 HISTORY — PX: LEFT HEART CATHETERIZATION WITH CORONARY/GRAFT ANGIOGRAM: SHX5450

## 2011-12-01 SURGERY — LEFT HEART CATHETERIZATION WITH CORONARY/GRAFT ANGIOGRAM
Anesthesia: LOCAL

## 2011-12-01 MED ORDER — DIAZEPAM 5 MG PO TABS
ORAL_TABLET | ORAL | Status: AC
  Filled 2011-12-01: qty 2

## 2011-12-01 MED ORDER — NITROGLYCERIN 0.2 MG/ML ON CALL CATH LAB
INTRAVENOUS | Status: AC
  Filled 2011-12-01: qty 1

## 2011-12-01 MED ORDER — DIAZEPAM 5 MG PO TABS
10.0000 mg | ORAL_TABLET | ORAL | Status: AC
  Administered 2011-12-01: 10 mg via ORAL

## 2011-12-01 MED ORDER — ACETAMINOPHEN 325 MG PO TABS: 650.0000 mg | ORAL_TABLET | ORAL | Status: DC | PRN

## 2011-12-01 MED ORDER — MIDAZOLAM HCL 2 MG/2ML IJ SOLN
INTRAMUSCULAR | Status: AC
  Filled 2011-12-01: qty 2

## 2011-12-01 MED ORDER — ONDANSETRON HCL 4 MG/2ML IJ SOLN: 4.0000 mg | Freq: Four times a day (QID) | INTRAMUSCULAR | Status: DC | PRN

## 2011-12-01 MED ORDER — ASPIRIN 81 MG PO CHEW
CHEWABLE_TABLET | ORAL | Status: AC
  Filled 2011-12-01: qty 4

## 2011-12-01 MED ORDER — SODIUM CHLORIDE 0.9 % IV SOLN
INTRAVENOUS | Status: DC
  Administered 2011-12-01: 07:00:00 via INTRAVENOUS

## 2011-12-01 MED ORDER — ASPIRIN 81 MG PO CHEW
324.0000 mg | CHEWABLE_TABLET | ORAL | Status: AC
  Administered 2011-12-01: 324 mg via ORAL

## 2011-12-01 MED ORDER — SODIUM CHLORIDE 0.9 % IJ SOLN: 3.0000 mL | INTRAMUSCULAR | Status: DC | PRN

## 2011-12-01 MED ORDER — HEPARIN (PORCINE) IN NACL 2-0.9 UNIT/ML-% IJ SOLN
INTRAMUSCULAR | Status: AC
  Filled 2011-12-01: qty 1000

## 2011-12-01 MED ORDER — SODIUM CHLORIDE 0.9 % IV SOLN
1.0000 mL/kg/h | INTRAVENOUS | Status: DC
  Administered 2011-12-01: 1 mL/kg/h via INTRAVENOUS

## 2011-12-01 MED ORDER — SODIUM CHLORIDE 0.9 % IV SOLN
250.0000 mL | INTRAVENOUS | Status: DC | PRN
Start: 2011-12-01 — End: 2011-12-01

## 2011-12-01 MED ORDER — FENTANYL CITRATE 0.05 MG/ML IJ SOLN
INTRAMUSCULAR | Status: AC
  Filled 2011-12-01: qty 2

## 2011-12-01 MED ORDER — SODIUM CHLORIDE 0.9 % IJ SOLN: 3.0000 mL | Freq: Two times a day (BID) | INTRAMUSCULAR | Status: DC

## 2011-12-01 MED ORDER — LIDOCAINE HCL (PF) 1 % IJ SOLN
INTRAMUSCULAR | Status: AC
  Filled 2011-12-01: qty 30

## 2011-12-01 NOTE — H&P (View-Only) (Signed)
[name] [name] Date of Birth: [birth year] Medical Record #[id number]  History of Present Illness: [name] [name] is seen today for a work in visit. She is seen for Dr. Elease Hashimoto. She has a history of CAD with recent CABG x 5 back in July. Her other issues are as noted below.   She comes in today. She is here alone. She notes that she has not felt well for almost a week. She is feeling exactly like she did back in July prior to her surgery (except for the jaw pain). She is lethargic. She has bilateral arm pain down to her elbows. She has been short of breath and coughing. She feels like there is pressure in her chest. She has also had some jaw pain and went to see her dentist. That evaluation was ok. All of her symptoms are worse with exertion. She has had to stop walking. No NTG use. Was told to throw it away following her surgery. No fever or chills. No sputum. This is identical but not as severe as to what she was having prior to her surgery.   Current Outpatient Prescriptions on File Prior to Visit  Medication Sig Dispense Refill  . aspirin 325 MG EC tablet Take 1 tablet (325 mg total) by mouth daily.  30 tablet    . atorvastatin (LIPITOR) 20 MG tablet Take 20 mg by mouth daily.      . colchicine 0.6 MG tablet Take 1 tablet (0.6 mg total) by mouth 2 (two) times daily as needed.  12 tablet  0  . EPINEPHrine (EPIPEN) 0.3 mg/0.3 mL DEVI Inject 0.3 mg into the muscle once as needed. For allergic reactions.      . metoprolol succinate (TOPROL-XL) 50 MG 24 hr tablet Take 1 tablet (50 mg total) by mouth daily.  90 tablet  1  . oxybutynin (DITROPAN-XL) 10 MG 24 hr tablet Take 10 mg by mouth daily.      Bertram Gala Glycol-Propyl Glycol 0.4-0.3 % SOLN Apply 1 drop to eye daily as needed.    0  . tamoxifen (NOLVADEX) 20 MG tablet Take 20 mg by mouth daily.        Allergies  Allergen Reactions  . Codeine Nausea And Vomiting       . Other Swelling    All Lip moisturizers except Vaseline.  .  Phenothiazines Other (See Comments)    Makes her stop breathing.  . Talwin (Pentazocine) Nausea And Vomiting  . Yellow Jacket Venom     Past Medical History  Diagnosis Date  . Breast cancer     on Tamoxifen  . Interstitial cystitis     on chronic antibiotics  . Hyperlipidemia   . Frequent UTI     on prophylaxis  . 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 Heart murmur   . Constipation   . Arthritis     Spine  . CAD (coronary artery disease)     s/p CABG x 5 August 18, 2011    Past Surgical History  Procedure Date  . Breast lumpectomy 04/2009    left  . Partial hysterectomy 1976    vaginal bleeding after 2nd child born  . Appendectomy 1976  . Cesarean section     x 2  . Muscle release     L neck and chest.  . Coronary artery bypass graft 08/18/2011    Procedure: CORONARY ARTERY BYPASS GRAFTING (CABG);  Surgeon: Alleen Borne, MD;  Location: [facility] OR;  Service: Open  Heart Surgery;  Laterality: N/A;  Coronary Artery Bypass Graft times five utilizing the left internal mammary artery and the left greater saphenous vein harvested endoscopically.    History  Smoking status  . Former Smoker  . Quit date: 02/15/2001  Smokeless tobacco  . Never Used    History  Alcohol Use  . 4.2 oz/week  . 7 Shots of liquor per week    Family History  Problem Relation Age of Onset  . Cancer Mother 33    breast cancer  . Heart disease Father 28    MI at 43, CABG in 44's  . Hepatitis Father     C from blood transfusion  . Heart disease Brother     CABG in 46's  . Heart disease Paternal Aunt   . Heart disease Paternal Uncle   . Heart disease Paternal Grandfather   . Diabetes Neg Hx     Review of Systems: The review of systems is per the HPI.  All other systems were reviewed and are negative.  Physical Exam: BP 140/86  Pulse 83  Ht 5\' 6"  (1.676 m)  Wt 138 lb 12.8 oz (62.959 kg)  BMI 22.40 kg/m2 Patient is very pleasant and in no acute distress. Skin is warm and dry. Color is normal.   HEENT is unremarkable. Normocephalic/atraumatic. PERRL. Sclera are nonicteric. Neck is supple. No masses. No JVD. Lungs are clear. Cardiac exam shows a regular rate and rhythm. Abdomen is soft. Extremities are without edema. Gait and ROM are intact. No gross neurologic deficits noted.   LABORATORY DATA: EKG today shows sinus rhythm. No acute changes. Tracing is reviewed with Dr. Elease Hashimoto.   Lab Results  Component Value Date   WBC 7.2 09/30/2011   HGB 11.1* 09/30/2011   HCT 34.8 09/30/2011   PLT 359 09/30/2011   GLUCOSE 95 09/30/2011   CHOL 170 06/04/2011   TRIG 100 06/04/2011   HDL 42 06/04/2011   LDLCALC 108* 06/04/2011   ALT 9 09/30/2011   AST 16 09/30/2011   NA 141 09/30/2011   K 4.2 09/30/2011   CL 106 09/30/2011   CREATININE 0.62 09/30/2011   BUN 18 09/30/2011   CO2 27 09/30/2011   TSH 2.43 07/06/2011   INR 1.62* 08/18/2011   HGBA1C 5.9* 08/16/2011    Assessment / Plan:  Chest pain/bilateral arm pain/jaw pain - in the setting of known CAD with recent CABG x 5. Her symptoms are quite concerning for possible graft failure. Cardiac catheterization has been recommended. She is subsequently seen with Dr. Elease Hashimoto. He is in agreement. Cardiac cath is planned with Dr. [place] for Wednesday. The procedure, risks and benefits have been reviewed with her and she is willing to proceed. NTG was called in as well to the drug store. If she has any worsening of symptoms she is to go to the ER. Activities are to be limited in the meantime.  Patient is agreeable to this plan and will call if any problems develop in the interim.

## 2011-12-01 NOTE — CV Procedure (Signed)
   Cardiac Catheterization Procedure Note  Name: [name] [name] MRN: [id number] DOB: 27-Jan-1946  Procedure: Left Heart Cath, Selective Coronary Angiography, saphenous vein graft angiography, LIMA graft angiography, LV angiography  Indication: 66 year old white female status post coronary bypass surgery in July of this year presents with symptoms of recurrent angina similar to her prior symptoms.   Procedural details: The right groin was prepped, draped, and anesthetized with 1% lidocaine. Using modified Seldinger technique, a 5 French sheath was introduced into the right femoral artery. Standard Judkins catheters were used for coronary angiography and left ventriculography. Catheter exchanges were performed over a guidewire. Sheath removal and hemostasis was obtained with a Mynx closure device. There were no immediate procedural complications. The patient was transferred to the post catheterization recovery area for further monitoring.  Procedural Findings: Hemodynamics:  AO 122/65 with a mean of 91 mmHg LV 121/5 mmHg   Coronary angiography: Coronary dominance: right  Left mainstem: The left main coronary is normal.  Left anterior descending (LAD): The left anterior descending artery is moderately calcified. There is a 70% stenosis in the proximal vessel. The mid vessel is diffusely diseased up to 40%. There is competitive filling from the IMA graft. The first diagonal is also moderately calcified. There is competitive filling from the vein graft.  Left circumflex (LCx): The left circumflex gives rise to 2 marginal branches. There is a 40-50% stenosis prior to the takeoff of the second marginal branch. The first marginal is without significant disease.  Right coronary artery (RCA): The right coronary is occluded at the ostium. It is moderately calcified. There is a long segment of occlusion before the mid vessel fills by right to right collaterals. The distal RCA fills by left to  right collaterals.  The saphenous vein graft sequentially to the first and second obtuse marginal vessels is very small and diffusely diseased. In the mid vein graft is a 90% stenosis. There is another 90% stenosis at the anastomotic site with the first obtuse marginal vessel. The continuation to the second marginal vessel is occluded.  The saphenous vein graft to the diagonal is widely patent with good runoff.  The saphenous vein graft to the PDA is occluded.  The LIMA graft to the LAD is widely patent with good runoff.  Left ventriculography: Left ventricular systolic function is normal, LVEF is estimated at 55-65%, there is no significant mitral regurgitation   Final Conclusions:   1. 2 vessel obstructive coronary disease. 2. Patent LIMA graft to the LAD. 3. Patent saphenous vein graft to the diagonal. 4. Small and diffusely diseased saphenous vein graft to the obtuse marginal vessels. There is no significant obstructive disease in the native circumflex. 5. Occluded saphenous vein graft to the right coronary. This vessel is chronically occluded with collateral flow. 6. Normal left ventricular function.  Recommendations: The only area that is not adequately revascularized is the native right coronary. This is chronically occluded with good collateral flow. It is a long segment of occlusion and would be poorly suited for CTO PCI. I would recommend medical therapy.  Theron Arista [facility] 12/01/2011, 9:38 AM

## 2011-12-01 NOTE — Interval H&P Note (Signed)
History and Physical Interval Note:  12/01/2011 8:46 AM  [name] [name]  has presented today for surgery, with the diagnosis of Chest pain  The various methods of treatment have been discussed with the patient and family. After consideration of risks, benefits and other options for treatment, the patient has consented to  Procedure(s) (LRB) with comments: LEFT HEART CATHETERIZATION WITH CORONARY/GRAFT ANGIOGRAM (N/A) as a surgical intervention .  [name] history has been reviewed, patient examined, no change in status, stable for surgery.  I have reviewed the patient's chart and labs.  Questions were answered to the patient's satisfaction.     Theron Arista [facility] 12/01/2011 8:47 AM

## 2011-12-03 ENCOUNTER — Encounter: Payer: Self-pay | Admitting: Physician Assistant

## 2011-12-03 ENCOUNTER — Ambulatory Visit (INDEPENDENT_AMBULATORY_CARE_PROVIDER_SITE_OTHER): Payer: BC Managed Care – PPO | Admitting: Physician Assistant

## 2011-12-03 VITALS — BP 132/80 | HR 70 | Resp 18 | Ht 66.0 in | Wt 138.8 lb

## 2011-12-03 DIAGNOSIS — I208 Other forms of angina pectoris: Secondary | ICD-10-CM

## 2011-12-03 DIAGNOSIS — E785 Hyperlipidemia, unspecified: Secondary | ICD-10-CM

## 2011-12-03 DIAGNOSIS — I209 Angina pectoris, unspecified: Secondary | ICD-10-CM

## 2011-12-03 DIAGNOSIS — I25810 Atherosclerosis of coronary artery bypass graft(s) without angina pectoris: Secondary | ICD-10-CM

## 2011-12-03 MED ORDER — ISOSORBIDE MONONITRATE ER 30 MG PO TB24: 30.0000 mg | ORAL_TABLET | Freq: Every day | ORAL | Status: DC

## 2011-12-03 MED ORDER — METOPROLOL SUCCINATE ER 50 MG PO TB24: 50.0000 mg | ORAL_TABLET | Freq: Two times a day (BID) | ORAL | Status: DC

## 2011-12-03 NOTE — Progress Notes (Signed)
[address], [address] [place], [place]  [address] Phone: [phone], Fax:  [phone]  Date:  12/03/2011   Name:  [name] [name]   DOB:  05/06/1945   MRN:  [id number]  PCP:  Lavonda Jumbo, MD  Primary Cardiologist:  Dr. Delane Ginger  Primary Electrophysiologist:  None    History of Present Illness: [name] [name] is a 66 y.o. female who returns for follow up 2 days after her heart catheterization.  She has a history of CAD, HL, breast cancer, tobacco abuse. She underwent CABG with Dr. Laneta Simmers in 7/13.  She had done well after her CABG until recently. She developed dyspnea and bilateral arm pain similar to her previous angina. Cardiac catheterization was arranged.  This demonstrated two-vessel CAD with a patent LIMA-LAD and patent SVG-diagonal. She had a small and diffusely diseased vein graft to the obtuse marginal vessels and no significant obstructive disease in the native circumflex and an occluded SVG-RCA. The RCA itself is chronically occluded with good collateral flow. Her LV function was normal. It was felt to the RCA was the only area that was not adequately revascularized. This appeared to be chronically occluded with good collateral flow and ultimately medical therapy was recommended.  She continues to have the same symptoms. She mainly notes chest and back pressure radiating into her arms with associated dyspnea. Overall, she is describing class III angina. She denies syncope. She denies orthopnea or PND. She denies significant pedal edema.   Labs (4/13):     LDL 108  Labs (5/13):     TSH 2.43  Labs (8/13):     K 4.2, creatinine 0.62, ALT 9,  Labs (10/13):   K 4.6, creatinine 0.8, Hgb 12.4   Wt Readings from Last 3 Encounters:  12/03/11 138 lb 12.8 oz (62.959 kg)  12/01/11 138 lb (62.596 kg)  12/01/11 138 lb (62.596 kg)     Past Medical History  Diagnosis Date  . Breast cancer     on Tamoxifen  . Interstitial cystitis     on chronic  antibiotics  . Hyperlipidemia   . Frequent UTI     on prophylaxis  . 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 Arthritis     Spine  . CAD (coronary artery disease)     a. s/p CABG 7/13;   b.  LHC 12/01/11:  pLAD 70%, mLAD 40%, CFX 40-50% prior to takeoff of the OM2, oRCA occluded, mid vessel filled via R->R collaterals and distal vessel filled by L->R collaterals, S-OM1/OM2 (small and diffusely dz) with mid 90% stenosis, 90% at OM1 anastomotic site, continuation of OM2 occluded, S-Dx patent, S-PDA occluded, L-LAD ok, EF 55-65%  => Med Rx rec.  [name] Hx of echocardiogram     Echo 5/13: EF 60-65%, grade 2 diastolic dysfunction    Current Outpatient Prescriptions  Medication Sig Dispense Refill  . aspirin 325 MG tablet Take 325 mg by mouth daily.      [name] atorvastatin (LIPITOR) 20 MG tablet Take 20 mg by mouth daily.      . colchicine 0.6 MG tablet Take 0.6 mg by mouth 2 (two) times daily as needed.      [name] EPINEPHrine (EPIPEN 2-PAK) 0.3 mg/0.3 mL DEVI Inject 0.3 mg into the muscle as needed.      . metoprolol succinate (TOPROL-XL) 50 MG 24 hr tablet Take 50 mg by mouth daily. Take with or immediately following a meal.      . nitroGLYCERIN (NITROSTAT)  0.4 MG SL tablet Place 0.4 mg under the tongue every 5 (five) minutes as needed.      [name] oxybutynin (DITROPAN-XL) 10 MG 24 hr tablet Take 10 mg by mouth daily.      Bertram Gala Glycol-Propyl Glycol 0.4-0.3 % SOLN Apply 1 drop to eye as needed. FOR DRY EYES      . tamoxifen (NOLVADEX) 20 MG tablet Take 20 mg by mouth daily.      [name] trimethoprim (TRIMPEX) 100 MG tablet Take 100 mg by mouth daily.        Allergies: Allergies  Allergen Reactions  . Codeine Nausea And Vomiting       . Other Swelling    All Lip moisturizers except Vaseline.  . Phenothiazines Other (See Comments)    Makes her stop breathing.  . Talwin (Pentazocine) Nausea And Vomiting  . Yellow Jacket Venom     Social History:   reports that she quit smoking about 10 years ago. She has never  used smokeless tobacco. She reports that she drinks about 4.2 ounces of alcohol per week. She reports that she does not use illicit drugs.   ROS:  Please see the history of present illness.   No fevers, chills, vomiting, diarrhea, melena, hematochezia. She does have an occasional cough when she is experiencing angina. This is nonproductive.    All other systems reviewed and negative.   PHYSICAL EXAM: VS:  BP 132/80  Pulse 70  Resp 18  Ht 5\' 6"  (1.676 m)  Wt 138 lb 12.8 oz (62.959 kg)  BMI 22.40 kg/m2  SpO2 97% Well nourished, well developed, in no acute distress HEENT: normal Neck: no JVD Cardiac:  normal S1, S2; RRR; no murmur Lungs:  clear to auscultation bilaterally, no wheezing, rhonchi or rales Abd: soft, nontender, no hepatomegaly Ext: no edema; right groin without hematoma or bruit  Skin: warm and dry Neuro:  CNs 2-12 intact, no focal abnormalities noted  EKG:  NSR, HR 87, normal axis, no ischemic changes      ASSESSMENT AND PLAN:  1. Exertional Angina:   Her symptoms are stable but overall significant. Her heart rate is in the 80s. I will increase her Toprol to 50 mg twice daily. I have asked her to try this for 3-5 days. If her symptoms are not improving, she can start isosorbide 30 mg daily. She already has a followup with Dr. Elease Hashimoto in 2-3 weeks. She should keep this appointment. If her symptoms progress, she will call for an earlier appointment.   2. Coronary Artery Disease:    Medication adjustments will be made as noted. Continue aspirin and statin.  3. Hyperlipidemia:   Continue statin. Followup labs pending next month.   Signed, Tereso Newcomer, PA-C  10:46 AM 12/03/2011

## 2011-12-03 NOTE — Patient Instructions (Addendum)
INCREASE TOPROL XL 50 MG 1 TABLET TWICE DAILY  A PRESCRIPTION FOR ISOSORBIDE MONONITRATE (IMDUR) 30 MG HAS BEEN SENT IN TODAY; TAKE IS ANGINA IS NOT BETTER OR SLIGHTLY BETTER AND YOU START ON 1 TABLET DAILY; YOU CAN PICK THIS UP IF NEEDED  KEEP APPT WITH DR. Elease Hashimoto 12/23/11

## 2011-12-06 ENCOUNTER — Telehealth: Payer: Self-pay | Admitting: Physician Assistant

## 2011-12-06 NOTE — Telephone Encounter (Signed)
Pt was notified and agrees to start the isosorbide.

## 2011-12-06 NOTE — Telephone Encounter (Signed)
plz return call to patient at [phone]  Patient was seen on Friday still having labored breathing, fatigue, and coughing plz call her to advise

## 2011-12-06 NOTE — Telephone Encounter (Signed)
Pt states she is feeling better but still has fatigue, heavy chest and dry cough.  Her symptoms are the same, no better, no worse than on Friday.  She is no longer having arm pain or jaw pain.  She is still sob with exertion.

## 2011-12-06 NOTE — Telephone Encounter (Signed)
Go ahead and start taking the Isosorbide 30 mg QD. Tereso Newcomer, PA-C  1:49 PM 12/06/2011

## 2011-12-07 ENCOUNTER — Other Ambulatory Visit: Payer: BC Managed Care – PPO

## 2011-12-07 ENCOUNTER — Encounter: Payer: Self-pay | Admitting: *Deleted

## 2011-12-07 ENCOUNTER — Ambulatory Visit: Payer: BC Managed Care – PPO | Admitting: Cardiovascular Disease

## 2011-12-07 ENCOUNTER — Telehealth: Payer: Self-pay | Admitting: Cardiovascular Disease

## 2011-12-07 NOTE — Telephone Encounter (Signed)
The Imdur will cause a headache.  It should resolve after several days.  It will not cause coughing.  That is probably allergies.

## 2011-12-07 NOTE — Telephone Encounter (Signed)
Pt took one dose of Imdur 30 mg and had a headache for the rest of the day, Please advise. Also, she continues to have a dry cough that is non productive and relieved with sips of water, please advise if a med could be causing?  I reviewed meds and cough was not seen on list of side effects. I told pt to stay hydrated and try half a dose of med with food to see if helps side effect of HA. Pt told that she will get a call back tomorrow. Pt agreed to plan.

## 2011-12-07 NOTE — Telephone Encounter (Signed)
F/u  Patient calling to speak with nurse regarding her isosorbide. Patient is aware message will be sent around to Dr, Melburn Popper nurse.

## 2011-12-07 NOTE — Telephone Encounter (Signed)
Pt can not take the isosorbide it gave her headaches and other side effects and she would like to go on to the next thing.

## 2011-12-08 NOTE — Telephone Encounter (Signed)
Pt informed and agreed to plan,

## 2011-12-08 NOTE — Telephone Encounter (Signed)
Pt informed and agreed to plan

## 2011-12-09 ENCOUNTER — Other Ambulatory Visit: Payer: BC Managed Care – PPO

## 2011-12-09 DIAGNOSIS — Z23 Encounter for immunization: Secondary | ICD-10-CM

## 2011-12-09 MED ORDER — INFLUENZA VIRUS VACC SPLIT PF IM SUSP: 0.5000 mL | Freq: Once | INTRAMUSCULAR | Status: DC

## 2011-12-14 NOTE — Telephone Encounter (Signed)
I will need to see her to make this decision.  I can see her Friday in [place]

## 2011-12-14 NOTE — Telephone Encounter (Signed)
Since starting Imdur and metoprolol she has had a headache despite cutting Imdur in half. She now c/o acid reflux to the point of nausea, night sweats, cough , and lethargy. I told her to hold Imdur till Dr Elease Hashimoto can recommend something different and to continue metoprolol. Pt aware Dr out of office and she may not hear back till tomorrow, pt agreed to plan. Please advise.

## 2011-12-14 NOTE — Telephone Encounter (Signed)
New problem:   C/O side effect from medication

## 2011-12-15 NOTE — Telephone Encounter (Signed)
App made for Friday as requested. Pt willing to go to [place].

## 2011-12-17 ENCOUNTER — Ambulatory Visit (INDEPENDENT_AMBULATORY_CARE_PROVIDER_SITE_OTHER): Payer: BC Managed Care – PPO | Admitting: Cardiovascular Disease

## 2011-12-17 ENCOUNTER — Encounter: Payer: Self-pay | Admitting: Cardiovascular Disease

## 2011-12-17 VITALS — BP 140/78 | HR 92 | Ht 66.0 in | Wt 141.2 lb

## 2011-12-17 DIAGNOSIS — R0789 Other chest pain: Secondary | ICD-10-CM

## 2011-12-17 DIAGNOSIS — I208 Other forms of angina pectoris: Secondary | ICD-10-CM

## 2011-12-17 DIAGNOSIS — R0602 Shortness of breath: Secondary | ICD-10-CM

## 2011-12-17 DIAGNOSIS — I209 Angina pectoris, unspecified: Secondary | ICD-10-CM

## 2011-12-17 DIAGNOSIS — I2510 Atherosclerotic heart disease of native coronary artery without angina pectoris: Secondary | ICD-10-CM

## 2011-12-17 MED ORDER — OMEPRAZOLE 20 MG PO CPDR: 20.0000 mg | DELAYED_RELEASE_CAPSULE | Freq: Two times a day (BID) | ORAL | Status: DC

## 2011-12-17 MED ORDER — CARVEDILOL 12.5 MG PO TABS: 12.5000 mg | ORAL_TABLET | Freq: Two times a day (BID) | ORAL | Status: DC

## 2011-12-17 NOTE — Assessment & Plan Note (Signed)
That he has had several consultations following her bypass surgery. She started having repeat episodes of angina. A cardiac catheterization revealed that she had occlusion of the saphenous vein graft to the right coronary artery. The native right coronary artery is chronically occluded and has intact collateral flow.  Her metoprolol dose was increased and she was also started on isosorbide. Since that time she's not felt well. She had very intense headaches which resolved after she discontinued the isosorbide. She continues to have problems which she attributes to the metoprolol. These include profound fatigue, shortness of breath, significant gastroesophageal reflux disease and a cough. She did not have any B. symptoms at low dose of metoprolol.  At this point she remains slightly tachycardic despite being on fairly good dose of metoprolol. I would be inclined to change her to a different beta blocker to see if she can tolerate it better. We'll try her on carvedilol 12.5 mg twice a day. Other options would include bisoprolol or perhaps atenolol.  We discussed the possibility of starting her on Ranexa. We'll start her on Ranexa if she continues to have episodes of angina after we have resolved these other issues.  I will see her back in the office and was a week or so to see how she's doing with these medication changes.

## 2011-12-17 NOTE — Patient Instructions (Addendum)
Your physician has recommended you make the following change in your medication:  -start omeprazole 20 mg twice daily (over the counter) -start coreg/carvedilol 12.5 mg twice daily - stop metoprolol  Your physician recommends that you return for lab work in: TODAY  Your physician wants you to follow-up December 28, 2011 with Dr. Elease Hashimoto. You will receive a reminder letter in the mail two months in advance. If you don't receive a letter, please call our office to schedule the follow-up appointment.

## 2011-12-17 NOTE — Progress Notes (Signed)
Yisroel Ramming Date of Birth  08-Mar-1945       [facility]    [place] [address]  [address] [place], [place]  [address]   [place], [place]  [address] [phone]     [phone]   Fax  [phone]    Fax [phone]  Problem List: 1. CAD- CABG 2. Breast cancer - on Tamoxifin  3. Hyperlipidemia  4. Interstitial cystitis  History of Present Illness:  Kathie Rhodes has not been doing very well.  She presented with CP and cardiac cath revealed that the saphenous vein graft to the left circumflex artery was severely diseased. Her native circumflex artery has only minor luminal irregularities. The saphenous vein graft to right coronary artery was also found to be occluded and her native right coronary artery is chronically occluded.  Since that time she's been tried on medical therapy.  We doubled her metoprol and added isosorbide.. This has caused profound fatigue. She also has had severe shortness of breath, and severe headache. She has stopped the isosorbide ( because of the headache).  She also has been having lots of acid reflux and a chronic cough.  She has noted exertional dyspnea, and cough.  Current Outpatient Prescriptions on File Prior to Visit  Medication Sig Dispense Refill  . aspirin 325 MG tablet Take 325 mg by mouth daily.      [name] atorvastatin (LIPITOR) 20 MG tablet Take 20 mg by mouth daily.      . colchicine 0.6 MG tablet Take 0.6 mg by mouth 2 (two) times daily as needed.      [name] EPINEPHrine (EPIPEN 2-PAK) 0.3 mg/0.3 mL DEVI Inject 0.3 mg into the muscle as needed.      . metoprolol succinate (TOPROL-XL) 50 MG 24 hr tablet Take 1 tablet (50 mg total) by mouth 2 (two) times daily with a meal. Take with or immediately following a meal.  60 tablet  11  . nitroGLYCERIN (NITROSTAT) 0.4 MG SL tablet Place 0.4 mg under the tongue every 5 (five) minutes as needed.      [name] oxybutynin (DITROPAN-XL) 10 MG 24 hr tablet Take 10 mg  by mouth daily.      Bertram Gala Glycol-Propyl Glycol 0.4-0.3 % SOLN Apply 1 drop to eye as needed. FOR DRY EYES      . tamoxifen (NOLVADEX) 20 MG tablet Take 20 mg by mouth daily.      [name] trimethoprim (TRIMPEX) 100 MG tablet Take 100 mg by mouth daily.       Current Facility-Administered Medications on File Prior to Visit  Medication Dose Route Frequency Provider Last Rate Last Dose  . influenza  inactive virus vaccine (FLUZONE/FLUARIX) injection 0.5 mL  0.5 mL Intramuscular Once Joselyn Arrow, MD        Allergies  Allergen Reactions  . Codeine Nausea And Vomiting       . Isosorbide     Extreme headaches  . Other Swelling    All Lip moisturizers except Vaseline.  . Phenothiazines Other (See Comments)    Makes her stop breathing.  . Talwin (Pentazocine) Nausea And Vomiting  . Yellow Jacket Venom     Past Medical History  Diagnosis Date  . Breast cancer     on Tamoxifen  . Interstitial cystitis     on chronic antibiotics  . Hyperlipidemia   . Frequent UTI     on prophylaxis  . 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  Arthritis     Spine  . CAD (coronary artery disease)     a. s/p CABG 7/13;   b.  LHC 12/01/11:  pLAD 70%, mLAD 40%, CFX 40-50% prior to takeoff of the OM2, oRCA occluded, mid vessel filled via R->R collaterals and distal vessel filled by L->R collaterals, S-OM1/OM2 (small and diffusely dz) with mid 90% stenosis, 90% at OM1 anastomotic site, continuation of OM2 occluded, S-Dx patent, S-PDA occluded, L-LAD ok, EF 55-65%  => Med Rx rec.  [name] Hx of echocardiogram     Echo 5/13: EF 60-65%, grade 2 diastolic dysfunction    Past Surgical History  Procedure Date  . Breast lumpectomy 04/2009    left  . Partial hysterectomy 1976    vaginal bleeding after 2nd child born  . Appendectomy 1976  . Cesarean section     x 2  . Muscle release     L neck and chest.  . Coronary artery bypass graft 08/18/2011    Procedure: CORONARY ARTERY BYPASS GRAFTING (CABG);  Surgeon: Alleen Borne, MD;   Location: [facility] OR;  Service: Open Heart Surgery;  Laterality: N/A;  Coronary Artery Bypass Graft times five utilizing the left internal mammary artery and the left greater saphenous vein harvested endoscopically.    History  Smoking status  . Former Smoker  . Quit date: 02/15/2001  Smokeless tobacco  . Never Used    History  Alcohol Use  . 4.2 oz/week  . 7 Shots of liquor per week    Family History  Problem Relation Age of Onset  . Cancer Mother 37    breast cancer  . Heart disease Father 67    MI at 62, CABG in 7's  . Hepatitis Father     C from blood transfusion  . Heart disease Brother     CABG in 33's  . Heart disease Paternal Aunt   . Heart disease Paternal Uncle   . Heart disease Paternal Grandfather   . Diabetes Neg Hx     Reviw of Systems:  Reviewed in the HPI.  All other systems are negative.  Physical Exam: Blood pressure 140/78, pulse 92, height 5\' 6"  (1.676 m), weight 141 lb 4 oz (64.071 kg). General: Well developed, well nourished, in no acute distress.  Head: Normocephalic, atraumatic, sclera non-icteric, mucus membranes are moist,   Neck: Supple. Carotids are 2 + without bruits. No JVD  Lungs: Clear bilaterally to auscultation.  Heart: regular rate.  normal  S1 S2. No murmurs, gallops or rubs.  Abdomen: Soft, non-tender, non-distended with normal bowel sounds. No hepatomegaly. No rebound/guarding. No masses.  Msk:  Strength and tone are normal  Extremities: No clubbing or cyanosis. No edema.  Distal pedal pulses are 2+ and equal bilaterally.  Neuro: Alert and oriented X 3. Moves all extremities spontaneously.  Psych:  Responds to questions appropriately with a normal affect.  ECG: Nov. 1, 2013 - NSR - at 95 .  No ST or T wave changes. Assessment / Plan:

## 2011-12-18 LAB — COMPREHENSIVE METABOLIC PANEL
Albumin/Globulin Ratio: 1.2 (ref 1.1–2.5)
Albumin: 3.7 g/dL (ref 3.6–4.8)
BUN: 15 mg/dL (ref 8–27)
CO2: 19 mmol/L (ref 19–28)
Calcium: 8.5 mg/dL — ABNORMAL LOW (ref 8.6–10.2)
Creatinine, Ser: 0.69 mg/dL (ref 0.57–1.00)
GFR calc non Af Amer: 92 mL/min/{1.73_m2} (ref >59)
Globulin, Total: 3 g/dL (ref 1.5–4.5)
Total Protein: 6.7 g/dL (ref 6.0–8.5)

## 2011-12-18 LAB — CBC WITH DIFFERENTIAL
Basos: 0 % (ref 0–3)
Eos: 1 % (ref 0–5)
HCT: 35.5 % (ref 34.0–46.6)
Hemoglobin: 11.7 g/dL (ref 11.1–15.9)
Immature Grans (Abs): 0 10*3/uL (ref 0.0–0.1)
Lymphocytes Absolute: 1.4 10*3/uL (ref 0.7–3.1)
MCH: 27.6 pg (ref 26.6–33.0)
MCHC: 33 g/dL (ref 31.5–35.7)
MCV: 84 fL (ref 79–97)
Monocytes Absolute: 0.7 10*3/uL (ref 0.1–0.9)
Neutrophils Absolute: 7 10*3/uL (ref 1.4–7.0)
Neutrophils Relative %: 77 % — ABNORMAL HIGH (ref 40–74)
RBC: 4.24 x10E6/uL (ref 3.77–5.28)

## 2011-12-18 LAB — LIPID PANEL
Chol/HDL Ratio: 3.7 ratio units (ref 0.0–4.4)
LDL Calculated: 62 mg/dL (ref 0–99)

## 2011-12-20 ENCOUNTER — Telehealth: Payer: Self-pay | Admitting: Cardiovascular Disease

## 2011-12-20 MED ORDER — CARVEDILOL 25 MG PO TABS: 25.0000 mg | ORAL_TABLET | Freq: Two times a day (BID) | ORAL | Status: DC

## 2011-12-20 NOTE — Telephone Encounter (Signed)
Pt wants a rtn call regarding last OV that's all the info she would give me

## 2011-12-20 NOTE — Telephone Encounter (Signed)
Pt states her heart rate is going up since last ov, bp 130/80 p 96, pt at work and states she is feeling better since medication changes, no more night sweats/ no lethargy no acid reflux. Pt c/o continued cough and breathlessness, please advise.

## 2011-12-20 NOTE — Telephone Encounter (Signed)
Pt informed and agreed to plan. Order complete.

## 2011-12-20 NOTE — Telephone Encounter (Signed)
Increase carvedilol to 25 BID.

## 2011-12-23 ENCOUNTER — Ambulatory Visit: Payer: BC Managed Care – PPO | Admitting: Cardiovascular Disease

## 2011-12-23 ENCOUNTER — Other Ambulatory Visit: Payer: BC Managed Care – PPO

## 2011-12-28 ENCOUNTER — Ambulatory Visit: Payer: Medicare Other | Admitting: Cardiovascular Disease

## 2011-12-28 ENCOUNTER — Inpatient Hospital Stay (HOSPITAL_COMMUNITY): Payer: BC Managed Care – PPO

## 2011-12-28 ENCOUNTER — Encounter (HOSPITAL_COMMUNITY): Payer: Self-pay

## 2011-12-28 ENCOUNTER — Inpatient Hospital Stay (HOSPITAL_COMMUNITY)
Admission: EM | Admit: 2011-12-28 | Discharge: 2012-01-08 | DRG: 572 | Disposition: A | Discharge status: Home / Self Care | Payer: BC Managed Care – PPO | Attending: Internal Medicine | Admitting: Internal Medicine

## 2011-12-28 ENCOUNTER — Telehealth: Payer: Self-pay

## 2011-12-28 ENCOUNTER — Emergency Department (HOSPITAL_COMMUNITY): Payer: BC Managed Care – PPO

## 2011-12-28 ENCOUNTER — Telehealth: Payer: Self-pay | Admitting: Cardiovascular Disease

## 2011-12-28 DIAGNOSIS — I10 Essential (primary) hypertension: Secondary | ICD-10-CM | POA: Diagnosis present

## 2011-12-28 DIAGNOSIS — R188 Other ascites: Secondary | ICD-10-CM

## 2011-12-28 DIAGNOSIS — I2510 Atherosclerotic heart disease of native coronary artery without angina pectoris: Secondary | ICD-10-CM | POA: Diagnosis present

## 2011-12-28 DIAGNOSIS — D649 Anemia, unspecified: Secondary | ICD-10-CM | POA: Diagnosis not present

## 2011-12-28 DIAGNOSIS — C569 Malignant neoplasm of unspecified ovary: Principal | ICD-10-CM | POA: Diagnosis present

## 2011-12-28 DIAGNOSIS — R5381 Other malaise: Secondary | ICD-10-CM

## 2011-12-28 DIAGNOSIS — R0600 Dyspnea, unspecified: Secondary | ICD-10-CM | POA: Diagnosis present

## 2011-12-28 DIAGNOSIS — R0902 Hypoxemia: Secondary | ICD-10-CM | POA: Diagnosis present

## 2011-12-28 DIAGNOSIS — N9489 Other specified conditions associated with female genital organs and menstrual cycle: Secondary | ICD-10-CM | POA: Diagnosis present

## 2011-12-28 DIAGNOSIS — Z951 Presence of aortocoronary bypass graft: Secondary | ICD-10-CM

## 2011-12-28 DIAGNOSIS — C786 Secondary malignant neoplasm of retroperitoneum and peritoneum: Secondary | ICD-10-CM | POA: Diagnosis present

## 2011-12-28 DIAGNOSIS — R000 Tachycardia, unspecified: Secondary | ICD-10-CM

## 2011-12-28 DIAGNOSIS — R599 Enlarged lymph nodes, unspecified: Secondary | ICD-10-CM | POA: Diagnosis present

## 2011-12-28 DIAGNOSIS — E7800 Pure hypercholesterolemia, unspecified: Secondary | ICD-10-CM | POA: Diagnosis present

## 2011-12-28 DIAGNOSIS — J984 Other disorders of lung: Secondary | ICD-10-CM | POA: Diagnosis present

## 2011-12-28 DIAGNOSIS — C50919 Malignant neoplasm of unspecified site of unspecified female breast: Secondary | ICD-10-CM | POA: Diagnosis present

## 2011-12-28 DIAGNOSIS — J910 Malignant pleural effusion: Secondary | ICD-10-CM | POA: Diagnosis present

## 2011-12-28 DIAGNOSIS — Z7982 Long term (current) use of aspirin: Secondary | ICD-10-CM

## 2011-12-28 DIAGNOSIS — R229 Localized swelling, mass and lump, unspecified: Secondary | ICD-10-CM

## 2011-12-28 DIAGNOSIS — Z9889 Other specified postprocedural states: Secondary | ICD-10-CM

## 2011-12-28 DIAGNOSIS — K219 Gastro-esophageal reflux disease without esophagitis: Secondary | ICD-10-CM | POA: Diagnosis present

## 2011-12-28 DIAGNOSIS — E41 Nutritional marasmus: Secondary | ICD-10-CM | POA: Diagnosis present

## 2011-12-28 DIAGNOSIS — R0609 Other forms of dyspnea: Secondary | ICD-10-CM

## 2011-12-28 DIAGNOSIS — R5383 Other fatigue: Secondary | ICD-10-CM

## 2011-12-28 DIAGNOSIS — R55 Syncope and collapse: Secondary | ICD-10-CM | POA: Diagnosis not present

## 2011-12-28 DIAGNOSIS — D0590 Unspecified type of carcinoma in situ of unspecified breast: Secondary | ICD-10-CM | POA: Diagnosis present

## 2011-12-28 DIAGNOSIS — Z79899 Other long term (current) drug therapy: Secondary | ICD-10-CM

## 2011-12-28 DIAGNOSIS — J90 Pleural effusion, not elsewhere classified: Secondary | ICD-10-CM | POA: Diagnosis present

## 2011-12-28 HISTORY — DX: Pure hypercholesterolemia, unspecified: E78.00

## 2011-12-28 HISTORY — DX: Gout, unspecified: M10.9

## 2011-12-28 HISTORY — DX: Major depressive disorder, single episode, unspecified: F32.9

## 2011-12-28 HISTORY — DX: Pleural effusion, not elsewhere classified: J90

## 2011-12-28 HISTORY — DX: Gastro-esophageal reflux disease without esophagitis: K21.9

## 2011-12-28 HISTORY — DX: Depression, unspecified: F32.A

## 2011-12-28 HISTORY — DX: Other specified postprocedural states: Z98.890

## 2011-12-28 LAB — CBC WITH DIFFERENTIAL/PLATELET
Basophils Relative: 0 % (ref 0–1)
Eosinophils Relative: 1 % (ref 0–5)
HCT: 35.5 % — ABNORMAL LOW (ref 36.0–46.0)
Hemoglobin: 11.5 g/dL — ABNORMAL LOW (ref 12.0–15.0)
MCHC: 32.4 g/dL (ref 30.0–36.0)
MCV: 82.2 fL (ref 78.0–100.0)
Monocytes Absolute: 0.6 10*3/uL (ref 0.1–1.0)
Monocytes Relative: 7 % (ref 3–12)
Neutro Abs: 6.9 10*3/uL (ref 1.7–7.7)

## 2011-12-28 LAB — POCT I-STAT, CHEM 8
BUN: 15 mg/dL (ref 6–23)
Calcium, Ion: 1.09 mmol/L — ABNORMAL LOW (ref 1.13–1.30)
Creatinine, Ser: 1 mg/dL (ref 0.50–1.10)
Glucose, Bld: 134 mg/dL — ABNORMAL HIGH (ref 70–99)
Hemoglobin: 12.6 g/dL (ref 12.0–15.0)
TCO2: 23 mmol/L (ref 0–100)

## 2011-12-28 LAB — CBC
Hemoglobin: 11.3 g/dL — ABNORMAL LOW (ref 12.0–15.0)
RBC: 4.32 MIL/uL (ref 3.87–5.11)
WBC: 9.2 10*3/uL (ref 4.0–10.5)

## 2011-12-28 LAB — POCT I-STAT TROPONIN I

## 2011-12-28 LAB — TROPONIN I
Troponin I: <0.3 ng/mL (ref <0.30)
Troponin I: <0.3 ng/mL (ref <0.30)

## 2011-12-28 LAB — LACTATE DEHYDROGENASE: LDH: 305 U/L — ABNORMAL HIGH (ref 94–250)

## 2011-12-28 LAB — BODY FLUID CELL COUNT WITH DIFFERENTIAL
Lymphs, Fluid: 94 %
Neutrophil Count, Fluid: 2 % (ref 0–25)

## 2011-12-28 LAB — CREATININE, SERUM
Creatinine, Ser: 0.68 mg/dL (ref 0.50–1.10)
GFR calc Af Amer: >90 mL/min (ref >90)
GFR calc non Af Amer: 90 mL/min — ABNORMAL LOW (ref >90)

## 2011-12-28 LAB — PRO B NATRIURETIC PEPTIDE: Pro B Natriuretic peptide (BNP): 828.2 pg/mL — ABNORMAL HIGH (ref 0–125)

## 2011-12-28 MED ORDER — ENOXAPARIN SODIUM 40 MG/0.4ML ~~LOC~~ SOLN
40.0000 mg | SUBCUTANEOUS | Status: DC
  Administered 2011-12-29 – 2012-01-07 (×9): 40 mg via SUBCUTANEOUS
  Filled 2011-12-28 (×13): qty 0.4

## 2011-12-28 MED ORDER — ONDANSETRON HCL 4 MG PO TABS: 4.0000 mg | ORAL_TABLET | Freq: Four times a day (QID) | ORAL | Status: DC | PRN

## 2011-12-28 MED ORDER — POLYETHYL GLYCOL-PROPYL GLYCOL 0.4-0.3 % OP SOLN: 1.0000 [drp] | Freq: Every morning | OPHTHALMIC | Status: DC

## 2011-12-28 MED ORDER — LEVOFLOXACIN IN D5W 750 MG/150ML IV SOLN
750.0000 mg | INTRAVENOUS | Status: DC
  Administered 2011-12-28 – 2011-12-31 (×4): 750 mg via INTRAVENOUS
  Filled 2011-12-28 (×5): qty 150

## 2011-12-28 MED ORDER — ASPIRIN 325 MG PO TABS
325.0000 mg | ORAL_TABLET | Freq: Every day | ORAL | Status: DC
  Administered 2011-12-29 – 2012-01-08 (×10): 325 mg via ORAL
  Filled 2011-12-28 (×12): qty 1

## 2011-12-28 MED ORDER — DOCUSATE SODIUM 100 MG PO CAPS
100.0000 mg | ORAL_CAPSULE | Freq: Two times a day (BID) | ORAL | Status: DC
  Administered 2011-12-28 – 2012-01-08 (×19): 100 mg via ORAL
  Filled 2011-12-28 (×25): qty 1

## 2011-12-28 MED ORDER — SODIUM CHLORIDE 0.9 % IJ SOLN: 3.0000 mL | INTRAMUSCULAR | Status: DC | PRN

## 2011-12-28 MED ORDER — ALPRAZOLAM 0.25 MG PO TABS
0.2500 mg | ORAL_TABLET | Freq: Three times a day (TID) | ORAL | Status: DC | PRN
  Administered 2011-12-28 – 2012-01-06 (×5): 0.25 mg via ORAL
  Filled 2011-12-28 (×5): qty 1

## 2011-12-28 MED ORDER — IOHEXOL 350 MG/ML SOLN
80.0000 mL | Freq: Once | INTRAVENOUS | Status: AC | PRN
  Administered 2011-12-28: 80 mL via INTRAVENOUS

## 2011-12-28 MED ORDER — ONDANSETRON HCL 4 MG/2ML IJ SOLN
4.0000 mg | Freq: Four times a day (QID) | INTRAMUSCULAR | Status: DC | PRN
  Administered 2012-01-01 – 2012-01-06 (×2): 4 mg via INTRAVENOUS
  Filled 2011-12-28 (×3): qty 2

## 2011-12-28 MED ORDER — SODIUM CHLORIDE 0.9 % IJ SOLN
3.0000 mL | Freq: Two times a day (BID) | INTRAMUSCULAR | Status: DC
  Administered 2011-12-28 – 2012-01-04 (×13): 3 mL via INTRAVENOUS

## 2011-12-28 MED ORDER — OXYBUTYNIN CHLORIDE ER 10 MG PO TB24
10.0000 mg | ORAL_TABLET | Freq: Every day | ORAL | Status: DC
  Administered 2011-12-29 – 2012-01-08 (×11): 10 mg via ORAL
  Filled 2011-12-28 (×12): qty 1

## 2011-12-28 MED ORDER — NITROGLYCERIN 0.4 MG SL SUBL: 0.4000 mg | SUBLINGUAL_TABLET | SUBLINGUAL | Status: DC | PRN

## 2011-12-28 MED ORDER — ATORVASTATIN CALCIUM 20 MG PO TABS
20.0000 mg | ORAL_TABLET | Freq: Every day | ORAL | Status: DC
  Administered 2011-12-28 – 2012-01-07 (×11): 20 mg via ORAL
  Filled 2011-12-28 (×15): qty 1

## 2011-12-28 MED ORDER — CARVEDILOL 25 MG PO TABS
25.0000 mg | ORAL_TABLET | Freq: Two times a day (BID) | ORAL | Status: DC
  Administered 2011-12-28 – 2012-01-06 (×17): 25 mg via ORAL
  Filled 2011-12-28 (×20): qty 1

## 2011-12-28 MED ORDER — POLYVINYL ALCOHOL 1.4 % OP SOLN
1.0000 [drp] | Freq: Every day | OPHTHALMIC | Status: DC
  Administered 2011-12-28 – 2012-01-07 (×11): 1 [drp] via OPHTHALMIC
  Filled 2011-12-28: qty 15

## 2011-12-28 MED ORDER — ACETAMINOPHEN 650 MG RE SUPP: 650.0000 mg | Freq: Four times a day (QID) | RECTAL | Status: DC | PRN

## 2011-12-28 MED ORDER — TRIMETHOPRIM 100 MG PO TABS
100.0000 mg | ORAL_TABLET | Freq: Every day | ORAL | Status: DC
  Administered 2011-12-29 – 2012-01-02 (×5): 100 mg via ORAL
  Filled 2011-12-28 (×5): qty 1

## 2011-12-28 MED ORDER — PANTOPRAZOLE SODIUM 40 MG PO TBEC
40.0000 mg | DELAYED_RELEASE_TABLET | Freq: Every day | ORAL | Status: DC
  Administered 2011-12-28 – 2012-01-08 (×12): 40 mg via ORAL
  Filled 2011-12-28 (×11): qty 1

## 2011-12-28 MED ORDER — ALUM & MAG HYDROXIDE-SIMETH 200-200-20 MG/5ML PO SUSP: 30.0000 mL | Freq: Four times a day (QID) | ORAL | Status: DC | PRN

## 2011-12-28 MED ORDER — ACETAMINOPHEN 325 MG PO TABS
650.0000 mg | ORAL_TABLET | Freq: Four times a day (QID) | ORAL | Status: DC | PRN
  Administered 2012-01-05: 650 mg via ORAL
  Filled 2011-12-28: qty 2

## 2011-12-28 MED ORDER — TAMOXIFEN CITRATE 10 MG PO TABS
20.0000 mg | ORAL_TABLET | Freq: Every day | ORAL | Status: DC
  Administered 2011-12-29 – 2012-01-08 (×11): 20 mg via ORAL
  Filled 2011-12-28 (×12): qty 2

## 2011-12-28 MED ORDER — ALBUTEROL SULFATE (5 MG/ML) 0.5% IN NEBU: 2.5000 mg | INHALATION_SOLUTION | RESPIRATORY_TRACT | Status: DC | PRN

## 2011-12-28 MED ORDER — HYDROCODONE-ACETAMINOPHEN 5-325 MG PO TABS
1.0000 | ORAL_TABLET | ORAL | Status: DC | PRN
  Administered 2011-12-29 – 2012-01-08 (×8): 2 via ORAL
  Filled 2011-12-28 (×8): qty 2

## 2011-12-28 MED ORDER — SODIUM CHLORIDE 0.9 % IJ SOLN
3.0000 mL | Freq: Two times a day (BID) | INTRAMUSCULAR | Status: DC
  Administered 2011-12-28 – 2012-01-06 (×8): 3 mL via INTRAVENOUS

## 2011-12-28 MED ORDER — SODIUM CHLORIDE 0.9 % IV SOLN: 250.0000 mL | INTRAVENOUS | Status: DC | PRN

## 2011-12-28 NOTE — H&P (Addendum)
PATIENT DETAILS Name: [name] [name] Age: 66 y.o. Sex: female Date of Birth: 15-May-1945 Admit Date: 12/28/2011 WUJ:WJXBJ,YNW A, MD   CHIEF COMPLAINT:  Shortness of breath, cough and tachycardia  HPI: Patient is a 66 year old Caucasian female with a past medical history of coronary artery disease status post CABG earlier this year, unfortunately following a few months after CABG 2 of her grafts were occluded, history of breast cancer on chronic tamoxifen therapy who presented to the hospital for evaluation of the above noted complaints. Per patient her shortness of breath and cough have been going on for at least a month if not more. She claims that her shortness of breath is mostly exertional, and recently it has started to get worse and now she cannot even walk to the mailbox without stopping multiple times. She does not have orthopnea or PND. She does not have leg swelling. She claims that her cough is mostly nonproductive in nature. She denies any subjective fever. She claims that over the past few weeks she has been having issues with tachycardia and her Coreg dosing has been adjusted as well. Unfortunately over the weekend her shortness of breath, cough got significantly worse, she got in touch with her cardiologist today who advised her to come to the emergency room for further evaluation and treatment. In the emergency room she was found to have a large left sided pleural effusion on a chest x-ray. I was subsequently called to admit this patient for further evaluation and treatment.   ALLERGIES:   Allergies  Allergen Reactions  . Codeine Nausea And Vomiting       . Isosorbide     Extreme headaches  . Other Swelling    All Lip moisturizers except Vaseline.  . Phenothiazines Other (See Comments)    Makes her stop breathing.  . Talwin (Pentazocine) Nausea And Vomiting  . Yellow Jacket Venom     PAST MEDICAL HISTORY: Past Medical History  Diagnosis Date  . Breast cancer       on Tamoxifen  . Interstitial cystitis     on chronic antibiotics  . Hyperlipidemia   . Frequent UTI     on prophylaxis  . 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 Arthritis     Spine  . CAD (coronary artery disease)     a. s/p CABG 7/13;   b.  LHC 12/01/11:  pLAD 70%, mLAD 40%, CFX 40-50% prior to takeoff of the OM2, oRCA occluded, mid vessel filled via R->R collaterals and distal vessel filled by L->R collaterals, S-OM1/OM2 (small and diffusely dz) with mid 90% stenosis, 90% at OM1 anastomotic site, continuation of OM2 occluded, S-Dx patent, S-PDA occluded, L-LAD ok, EF 55-65%  => Med Rx rec.  [name] Hx of echocardiogram     Echo 5/13: EF 60-65%, grade 2 diastolic dysfunction    PAST SURGICAL HISTORY: Past Surgical History  Procedure Date  . Breast lumpectomy 04/2009    left  . Partial hysterectomy 1976    vaginal bleeding after 2nd child born  . Appendectomy 1976  . Cesarean section     x 2  . Muscle release     L neck and chest.  . Coronary artery bypass graft 08/18/2011    Procedure: CORONARY ARTERY BYPASS GRAFTING (CABG);  Surgeon: Alleen Borne, MD;  Location: [facility] OR;  Service: Open Heart Surgery;  Laterality: N/A;  Coronary Artery Bypass Graft times five utilizing the left internal mammary artery and the left greater saphenous vein harvested endoscopically.  MEDICATIONS AT HOME: Prior to Admission medications   Medication Sig Start Date End Date Taking? Authorizing Provider  aspirin 325 MG tablet Take 325 mg by mouth daily.   Yes Historical Provider, MD  atorvastatin (LIPITOR) 20 MG tablet Take 20 mg by mouth daily.   Yes Historical Provider, MD  carvedilol (COREG) 25 MG tablet Take 25 mg by mouth 2 (two) times daily. 12/20/11  Yes Vesta Mixer, MD  Docusate Calcium (STOOL SOFTENER PO) Take 2 capsules by mouth every morning.   Yes Historical Provider, MD  omeprazole (PRILOSEC) 20 MG capsule Take 20 mg by mouth daily. 12/17/11  Yes Vesta Mixer, MD  oxybutynin (DITROPAN-XL) 10 MG  24 hr tablet Take 10 mg by mouth daily.   Yes Historical Provider, MD  Polyethyl Glycol-Propyl Glycol 0.4-0.3 % SOLN Place 1 drop into both eyes every morning.    Yes Historical Provider, MD  tamoxifen (NOLVADEX) 20 MG tablet Take 20 mg by mouth daily.   Yes Historical Provider, MD  trimethoprim (TRIMPEX) 100 MG tablet Take 100 mg by mouth daily.   Yes Historical Provider, MD  EPINEPHrine (EPIPEN 2-PAK) 0.3 mg/0.3 mL DEVI Inject 0.3 mg into the muscle.     Historical Provider, MD  nitroGLYCERIN (NITROSTAT) 0.4 MG SL tablet Place 0.4 mg under the tongue every 5 (five) minutes as needed. Chest pain    Historical Provider, MD    FAMILY HISTORY: Family History  Problem Relation Age of Onset  . Cancer Mother 75    breast cancer  . Heart disease Father 75    MI at 46, CABG in 34's  . Hepatitis Father     C from blood transfusion  . Heart disease Brother     CABG in 31's  . Heart disease Paternal Aunt   . Heart disease Paternal Uncle   . Heart disease Paternal Grandfather   . Diabetes Neg Hx     SOCIAL HISTORY:  reports that she quit smoking about 10 years ago. She has never used smokeless tobacco. She reports that she drinks about 4.2 ounces of alcohol per week. She reports that she does not use illicit drugs.  REVIEW OF SYSTEMS:  Constitutional:   No  weight loss, night sweats,  Fevers, chills, fatigue.  HEENT:    No headaches, Difficulty swallowing,Tooth/dental problems,Sore throat,  No sneezing, itching, ear ache, nasal congestion, post nasal drip,   Cardio-vascular: No chest pain,  Orthopnea, PND, swelling in lower extremities, anasarca, dizziness, palpitations  GI:  No heartburn, indigestion, abdominal pain, nausea, vomiting, diarrhea, change in bowel habits, loss of appetite  Resp: No shortness of breath withest.  No excess mucus, no productive cough, No coughing up of blood.No change in color of mucus.No wheezing.No chest wall deformity  Skin:  no rash or  lesions.  GU:  no dysuria, change in color of urine, no urgency or frequency.  No flank pain.  Musculoskeletal: No joint pain or swelling.  No decreased range of motion.  No back pain.  Psych: No change in mood or affect. No depression or anxiety.  No memory loss.   PHYSICAL EXAM: Blood pressure 139/82, pulse 101, temperature 98.2 F (36.8 C), temperature source Oral, resp. rate 25, height 5\' 6"  (1.676 m), weight 63.504 kg (140 lb), SpO2 91.00%.  General appearance :Awake, alert, not in any distress. Speech Clear. Not toxic Looking HEENT: Atraumatic and Normocephalic, pupils equally reactive to light and accomodation Neck: supple, no JVD. No cervical lymphadenopathy.  Chest: Decreased air  entry on the left to mid base, few rales but otherwise no significant added sounds  CVS: S1 S2 regular, no murmurs.  Abdomen: Bowel sounds present, Non tender and not distended with no gaurding, rigidity or rebound. Extremities: B/L Lower Ext shows no edema, both legs are warm to touch, with  dorsalis pedis pulses palpable. Neurology: Awake alert, and oriented X 3, CN II-XII intact, Non focal Skin:No Rash Wounds:N/A  LABS ON ADMISSION:   Basename 12/28/11 0905  NA 137  K 3.9  CL 101  CO2 --  GLUCOSE 134*  BUN 15  CREATININE 1.00  CALCIUM --  MG --  PHOS --   No results found for this basename: AST:2,ALT:2,ALKPHOS:2,BILITOT:2,PROT:2,ALBUMIN:2 in the last 72 hours No results found for this basename: LIPASE:2,AMYLASE:2 in the last 72 hours  Basename 12/28/11 0905 12/28/11 0850  WBC -- 8.7  NEUTROABS -- 6.9  HGB 12.6 11.5*  HCT 37.0 35.5*  MCV -- 82.2  PLT -- 569*   No results found for this basename: CKTOTAL:3,CKMB:3,CKMBINDEX:3,TROPONINI:3 in the last 72 hours No results found for this basename: DDIMER:2 in the last 72 hours No components found with this basename: POCBNP:3   RADIOLOGIC STUDIES ON ADMISSION: Dg Chest Port 1 View  12/28/2011  *RADIOLOGY REPORT*  Clinical Data:  Shortness of breath.  PORTABLE CHEST - 1 VIEW  Comparison: Two-view chest 09/21/2011.  Findings: The heart is obscured by a large left pleural effusion and associated airspace disease.  A smaller right pleural effusion is slightly worse than on the prior exam.  Right basilar airspace disease has progressed.  Moderate pulmonary vascular congestion is evident.  IMPRESSION:  1.  Significant increase and a now large left pleural effusion. 2.  Slight increase in a small right pleural effusion. 3.  Progressive bilateral airspace disease.  While this may represent atelectasis, infection is not excluded. 3.  Moderate pulmonary vascular congestion is new.   Original Report Authenticated By: Marin Roberts, M.D.     ASSESSMENT AND PLAN: Present on Admission:  . Pleural effusion -Left-sided, at this time it is unknown whether this is a transudate or exudate  -Multiple etiologies are possible, including a parapneumonic effusion, congestive heart failure related effusion, potentially pulmonary embolism related effusion (on tamoxifen), potential malignancy given history of breast cancer.  -I have consulted PCCM for a diagnostic thoracocentesis.  [name] Dyspnea -This is mostly exertional, not very sure whether all of this is just related to pleural effusion. -She denies any orthopnea and PND. In fact during my examination I laid completely flat without much problems. -Given tachycardia, given history of being on tamoxifen-will get a CT angiogram of the chest to rule out a pulmonary embolism and also to define further anatomy/pathology -A 2-D echocardiogram will also be ordered.   . Tachycardia -This is in spite of being on Coreg 25 mg twice a day  -EKG confirms sinus tachycardia  -We'll get a CT angiogram of the chest to make sure she does not have pulmonary embolism.   . Breast cancer -We'll continue with her tamoxifen for now, if her CT CT angiogram of the chest is positive for pulmonary embolism we will  discontinue it   . Pure hypercholesterolemia -Continue with statins   . Coronary artery disease - She has developed angina like symptoms do to occlusion of her grafts (a few months after CABG) , she has been recommended by her cardiologist to maximize medical management.  -We'll continue with antiplatelet agents, beta blockers and statins.  -Cycle cardiac enzymes   .  HTN (hypertension) -Continue with her outpatient regimen.  Further plan will depend as patient's clinical course evolves and further radiologic and laboratory data become available. Patient will be monitored closely.   DVT Prophylaxis: -For now prophylactic Lovenox.  Code Status: -Full code  Total time spent for admission equals 45 minutes.  [facility] Pager [phone]  If 7PM-7AM, please contact night-coverage [email or web address] Password [email or web address] 12/28/2011, 11:20 AM

## 2011-12-28 NOTE — Procedures (Signed)
Thoracentesis Procedure Note  Pre-operative Diagnosis: Bilateral L>R pleural effusion.  Post-operative Diagnosis: same  Indications: Thoracentesis for pleural effusion.  Procedure Details  Consent: Informed consent was obtained. Risks of the procedure were discussed including: infection, bleeding, pain, pneumothorax.  Under sterile conditions the patient was positioned. Betadine solution and sterile drapes were utilized.  1% buffered lidocaine was used to anesthetize the 6 rib space. Fluid was obtained without any difficulties and minimal blood loss.  A dressing was applied to the wound and wound care instructions were provided.   Findings 1400 ml of bloody pleural fluid was obtained. A sample was sent to Pathology for cytogenetics, flow, and cell counts, as well as for infection analysis.  Complications:  None; patient tolerated the procedure well.          Condition: stable  Plan A follow up chest x-ray was ordered. Bed Rest for 2 hours. Tylenol 650 mg. for pain.  Attending Attestation: I was present and scrubbed for the entire procedure.

## 2011-12-28 NOTE — Progress Notes (Addendum)
ANTIBIOTIC CONSULT NOTE - INITIAL  Pharmacy Consult:  Levaquin Indication:  Possible PNA  Allergies  Allergen Reactions  . Codeine Nausea And Vomiting       . Isosorbide     Extreme headaches  . Other Swelling    All Lip moisturizers except Vaseline.  . Phenothiazines Other (See Comments)    Makes her stop breathing.  . Talwin (Pentazocine) Nausea And Vomiting  . Yellow Jacket Venom     Patient Measurements: Height: 5\' 6"  (167.6 cm) Weight: 140 lb (63.504 kg) IBW/kg (Calculated) : 59.3    Vital Signs: Temp: 98.2 F (36.8 C) (11/12 0837) Temp src: Oral (11/12 0837) BP: 140/85 mmHg (11/12 1145) Pulse Rate: 104  (11/12 1145)  Labs:  Basename 12/28/11 0905 12/28/11 0850  WBC -- 8.7  HGB 12.6 11.5*  PLT -- 569*  LABCREA -- --  CREATININE 1.00 --   Estimated Creatinine Clearance: 52.5 ml/min (by C-G formula based on Cr of 1). No results found for this basename: VANCOTROUGH:2,VANCOPEAK:2,VANCORANDOM:2,GENTTROUGH:2,GENTPEAK:2,GENTRANDOM:2,TOBRATROUGH:2,TOBRAPEAK:2,TOBRARND:2,AMIKACINPEAK:2,AMIKACINTROU:2,AMIKACIN:2, in the last 72 hours   Microbiology: No results found for this or any previous visit (from the past 720 hour(s)).  Medical History: Past Medical History  Diagnosis Date  . Breast cancer     on Tamoxifen  . Interstitial cystitis     on chronic antibiotics  . Hyperlipidemia   . Frequent UTI     on prophylaxis  . 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 Arthritis     Spine  . CAD (coronary artery disease)     a. s/p CABG 7/13;   b.  LHC 12/01/11:  pLAD 70%, mLAD 40%, CFX 40-50% prior to takeoff of the OM2, oRCA occluded, mid vessel filled via R->R collaterals and distal vessel filled by L->R collaterals, S-OM1/OM2 (small and diffusely dz) with mid 90% stenosis, 90% at OM1 anastomotic site, continuation of OM2 occluded, S-Dx patent, S-PDA occluded, L-LAD ok, EF 55-65%  => Med Rx rec.  [name] Hx of echocardiogram     Echo 5/13: EF 60-65%, grade 2 diastolic dysfunction        Assessment: 66 YOF admitted with shortness of breath, non-productive cough and tachycardia to start Levaquin for possible PNA.  Her renal function is appropriate for daily dosing of Levaquin.  Noted patient on chronic trimethoprim for UTI.   Goal of Therapy:  Clearance of infection / infection prevention   Plan:  - Levaquin IV Q24H - Monitor renal function, clinical course    Mende Biswell D. Laney Potash, PharmD, BCPS Pager:  [phone] 12/28/2011, 2:08 PM

## 2011-12-28 NOTE — Procedures (Signed)
Thoracentesis Procedure Note  Pre-operative Diagnosis: bilateral pleural effusion  Post-operative Diagnosis: same  Indications: diagnostic as well as therapeutic.   Procedure Details  Consent: Informed consent was obtained. Risks of the procedure were discussed including: infection, bleeding, pain, pneumothorax.  Under sterile conditions the patient was positioned. Betadine solution and sterile drapes were utilized.  1% buffered lidocaine was used to anesthetize the 9th rib space. Fluid was obtained without any difficulties and minimal blood loss.  A dressing was applied to the wound and wound care instructions were provided.   Findings 1400 ml of clear, yellow-orange pleural fluid was obtained. A sample was sent to Pathology for cytogenetics, flow, and cell counts, as well as for infection analysis.  Complications:  None; patient tolerated the procedure well.          Condition: stable  Plan A follow up chest x-ray was ordered. Bed Rest for 0 hours. Tylenol 650 mg. for pain.  Attending Attestation: I was present for the entire procedure.

## 2011-12-28 NOTE — ED Provider Notes (Signed)
I saw and evaluated the patient, reviewed the resident's note and I agree with the findings and plan. 66 year old, female, with a history of breast cancer, who takes tamoxifen, and also history of CABG, presents emergency department complaining of progressive shortness of breath, and cough for greater than a month.  She denies pain.  She denies nausea, vomiting, fevers, chills, leg pain or swelling.  She has never had congestive heart failure.  She has never had similar symptoms in the past.  On, examination.  She is mild bleeding, uncomfortable.  [name] tachypnea and has decreased breath sounds on left side.  Chest x-ray shows a large pleural effusion on the left.  We will admit her for further evaluation of possible pneumonia peripneumonic effusion versus malignancy versus congestive heart failure.  Cheri Guppy, MD 12/28/11 1009

## 2011-12-28 NOTE — ED Notes (Signed)
Pt back from CT, transported to room on 3W

## 2011-12-28 NOTE — Telephone Encounter (Signed)
Pt called to say she has appt with Dr. Elease Hashimoto this afternoon in our [place] office at 2 pm She states, "I cant wait that long!"  And wanted to know where he was this am. I explained he is in [place] office this am.  She says she is very sob (more than usual)- this is also very audible over phone, very labored. She is also c/o tachycardia (HR=111BPM). She denies CP but says dyspnea is getting worse. I had her hold while I called Dr. Elease Hashimoto. Dr. Elease Hashimoto says ok to work her in this am in [facility] office, to call Jodette to see what time is best I spoke with Jodette who says ok to come in at 0900 Pt informed Understanding verb.

## 2011-12-28 NOTE — ED Provider Notes (Signed)
History     CSN: [id number]  Arrival date & time 12/28/11  9629   First MD Initiated Contact with Patient 12/28/11 0919      Chief Complaint  Patient presents with  . Shortness of Breath    (Consider location/radiation/quality/duration/timing/severity/associated sxs/prior treatment) HPI Here for worsening dyspnea. Pt states she has been increasingly short of breath for past few weeks and it was worse this AM. Denies pain. Describes this as being a severe problem. Previously she would get SOB when walking up stairs or a short distance and today was SOB at rest. This occurred at home. She called her cardiologist this AM who recommended presenting here for evaluation. Still worse with exertion and improved with rest. Associated with non-productive cough for the past month. No unilateral leg swelling, erythema. No chest pain. No fever/chills.   Past Medical History  Diagnosis Date  . Breast cancer     on Tamoxifen  . Interstitial cystitis     on chronic antibiotics  . Hyperlipidemia   . Frequent UTI     on prophylaxis  . 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 Arthritis     Spine  . CAD (coronary artery disease)     a. s/p CABG 7/13;   b.  LHC 12/01/11:  pLAD 70%, mLAD 40%, CFX 40-50% prior to takeoff of the OM2, oRCA occluded, mid vessel filled via R->R collaterals and distal vessel filled by L->R collaterals, S-OM1/OM2 (small and diffusely dz) with mid 90% stenosis, 90% at OM1 anastomotic site, continuation of OM2 occluded, S-Dx patent, S-PDA occluded, L-LAD ok, EF 55-65%  => Med Rx rec.  [name] Hx of echocardiogram     Echo 5/13: EF 60-65%, grade 2 diastolic dysfunction    Past Surgical History  Procedure Date  . Breast lumpectomy 04/2009    left  . Partial hysterectomy 1976    vaginal bleeding after 2nd child born  . Appendectomy 1976  . Cesarean section     x 2  . Muscle release     L neck and chest.  . Coronary artery bypass graft 08/18/2011    Procedure: CORONARY ARTERY BYPASS  GRAFTING (CABG);  Surgeon: Alleen Borne, MD;  Location: [facility] OR;  Service: Open Heart Surgery;  Laterality: N/A;  Coronary Artery Bypass Graft times five utilizing the left internal mammary artery and the left greater saphenous vein harvested endoscopically.    Family History  Problem Relation Age of Onset  . Cancer Mother 65    breast cancer  . Heart disease Father 53    MI at 38, CABG in 28's  . Hepatitis Father     C from blood transfusion  . Heart disease Brother     CABG in 69's  . Heart disease Paternal Aunt   . Heart disease Paternal Uncle   . Heart disease Paternal Grandfather   . Diabetes Neg Hx     History  Substance Use Topics  . Smoking status: Former Smoker    Quit date: 02/15/2001  . Smokeless tobacco: Never Used  . Alcohol Use: 4.2 oz/week    7 Shots of liquor per week    OB History    Grav Para Term Preterm Abortions TAB SAB Ect Mult Living                  Review of Systems Negative for fever/chills.  Negative for chest pain.  Negative for vomiting, diarrhea.  All other systems reviewed and negative unless noted in HPI.  Allergies  Codeine; Isosorbide; Other; Phenothiazines; Talwin; and Yellow jacket venom  Home Medications   Current Outpatient Rx  Name  Route  Sig  Dispense  Refill  . ASPIRIN 325 MG PO TABS   Oral   Take 325 mg by mouth daily.         . ATORVASTATIN CALCIUM 20 MG PO TABS   Oral   Take 20 mg by mouth daily.         [name] CARVEDILOL 25 MG PO TABS   Oral   Take 25 mg by mouth 2 (two) times daily.         . STOOL SOFTENER PO   Oral   Take 2 capsules by mouth every morning.         [name] OMEPRAZOLE 20 MG PO CPDR   Oral   Take 20 mg by mouth daily.         . OXYBUTYNIN CHLORIDE ER 10 MG PO TB24   Oral   Take 10 mg by mouth daily.         [name] POLYETHYL GLYCOL-PROPYL GLYCOL 0.4-0.3 % OP SOLN   Both Eyes   Place 1 drop into both eyes every morning.          [name] TAMOXIFEN CITRATE 20 MG PO TABS   Oral   Take 20  mg by mouth daily.         [name] TRIMETHOPRIM 100 MG PO TABS   Oral   Take 100 mg by mouth daily.         [name] EPINEPHRINE 0.3 MG/0.3ML IJ DEVI   Intramuscular   Inject 0.3 mg into the muscle.          [name] NITROGLYCERIN 0.4 MG SL SUBL   Sublingual   Place 0.4 mg under the tongue every 5 (five) minutes as needed. Chest pain           BP 143/87  Pulse 109  Temp 98.2 F (36.8 C) (Oral)  Resp 27  Ht 5\' 6"  (1.676 m)  Wt 140 lb (63.504 kg)  BMI 22.60 kg/m2  SpO2 97%  Physical Exam Nursing note and vitals reviewed.  Constitutional: Pt is alert and appears stated age. Oropharynx: Airway open without erythema or exudate. Respiratory: No respiratory distress. Decreased breath sounds at left base.  CV: Extremities warm and well perfused. Murmur present. Slight tachycardia. No pedal edema.  Neuro: No motor nor sensory deficit. Head: Normocephalic and atraumatic. Eyes: No conjunctivitis, no scleral icterus. Neck: Supple, no mass. Chest: Non-tender. Sternotomy scar.  Abdomen: Soft, non-tender MSK: Extremities are atraumatic without deformity. Skin: No rash, no wounds.  ED Course  Procedures (including critical care time)  Labs Reviewed  CBC WITH DIFFERENTIAL - Abnormal; Notable for the following:    Hemoglobin 11.5 (*)     HCT 35.5 (*)     Platelets 569 (*)     Neutrophils Relative 80 (*)     All other components within normal limits  POCT I-STAT, CHEM 8 - Abnormal; Notable for the following:    Glucose, Bld 134 (*)     Calcium, Ion 1.09 (*)     All other components within normal limits  POCT I-STAT TROPONIN I  PROTIME-INR   Dg Chest Port 1 View  12/28/2011  *RADIOLOGY REPORT*  Clinical Data: Shortness of breath.  PORTABLE CHEST - 1 VIEW  Comparison: Two-view chest 09/21/2011.  Findings: The heart is obscured by a large left pleural effusion and associated airspace disease.  A smaller right pleural effusion is slightly worse than on the prior exam.  Right basilar  airspace disease has progressed.  Moderate pulmonary vascular congestion is evident.  IMPRESSION:  1.  Significant increase and a now large left pleural effusion. 2.  Slight increase in a small right pleural effusion. 3.  Progressive bilateral airspace disease.  While this may represent atelectasis, infection is not excluded. 3.  Moderate pulmonary vascular congestion is new.   Original Report Authenticated By: Marin Roberts, M.D.      1. Pleural effusion on left   2. Dyspnea       MDM  66 y.o. F with pertinent PMHx of CAD s/p CABG 6 months earlier, breast cancer on tamoxifen here with worsening SOB. Vitals with slight tachycardia, no respiratory distress. Afebrile, non-productive cough for past month. No chest pain. Work up with L side pleural effusion. Feel pt needs admission. Hospitalist called who will come see pt in ED. Pt and husband at bedside informed of results, plan to be admitted.         Charm Barges, MD 12/28/11 1028

## 2011-12-28 NOTE — Telephone Encounter (Signed)
Pt called the office this am requesting to be seen early.  She has had progressive cough ( nonproductive), no fever, severe dyspnea at rest and exertion.  HR of 111.  I told her to go to [facility] ER.  She was completely breathless talking to me on the phone.  I do not think this is necessarily a cardiac issue.  Will have the ER doctor see her.  She will need CXR, ABG.  She may need a STAT echo if there is concern for pericardial effusion.  She may need to  be evaluated for Pulmonary embolus.   Vesta Mixer, Montez Hageman., MD, [facility] 12/28/2011, 8:22 AM Office - [phone] Pager [phone]

## 2011-12-28 NOTE — ED Notes (Signed)
Pt states she hasn't been able to catch her breath since this morning but hasn't been feeling very well for a while. Had open heart bypass in July for a total of 5 CABG with 2 failures. Cough for 1 month. Heart racing this morning 111.

## 2011-12-28 NOTE — ED Notes (Signed)
Pt transported to CT ?

## 2011-12-28 NOTE — ED Notes (Signed)
Pt has been taking notes of her BP and her pulse since 12/19/2011. Has been having increased SOB for about a month and a lack of energy. Loss of appetite and sharp pains off and on in her upper thighs.

## 2011-12-28 NOTE — Consult Note (Signed)
PULMONARY  / CRITICAL CARE MEDICINE  Name: [name] [name] MRN: [id number] DOB: 05-23-1945  Primary Cardiology:  Dr. Melburn Popper    LOS: 0  REFERRING PROVIDER:  Dr. Jerral Ralph - [facility]  CHIEF COMPLAINT:  Shortness of breath / Cough  BRIEF PATIENT DESCRIPTION:  66 y/o F with PMH of breast CA (2010 s/p lumpectomy & radiation) on tamoxifen, HLD, HTN, CAD s/p CABG 7/13, recurrent chest pain with LH cath 12/01/11 with occlusion of 2 of grafted vessels.  Since second heart cath has had progressive SOB, dry cough and fatigue.  Admitted to [facility] 11/12 with same symptoms and was found to have bilateral L>R large pleural effusions.    LINES / TUBES:   CULTURES: 11/12 Pleural Fluid>>> 11/12 Pleural Cytology>>> 11/12 Pleural AFB / Fungal>>>  ANTIBIOTICS: Trimethoprim (chronic UTI proph)>>> Levaquin (pulm empiric) 11/12>>>  SIGNIFICANT EVENTS:  11/12 - Admit with dyspnea, L thora  LEVEL OF CARE:  Medical Floor PRIMARY SERVICE:  [facility] CONSULTANTS:  PCCM CODE STATUS:  Full DIET:  PO DVT Px:  Lovenox GI Px:  Protonix   HISTORY OF PRESENT ILLNESS:  66 y/o F with PMH of breast CA (2010 s/p lumpectomy & radiation) on tamoxifen, HLD, HTN, CAD s/p CABG 7/13, recurrent chest pain with LH cath 12/01/11 with occlusion of 2 of grafted vessels.  Since second heart cath has had progressive SOB, dry cough and fatigue. After CABG had returned back to normal activity of working a full week with no limitations.  She began having chest pains in Sept and underwent LHC with new occlusion of 2 grafted vessels. Week prior to admit she noted significant decrease in  energy, worsening of cough (non-productive), stress incontinence with cough and decreased appetite presenting to [facility] 11/12 ER and evaluation noted CT of Chest with bilateral L>R large pleural effusions that were not present on CT in 09/2011.  Patient also notes no sensation / warning of urge to urinate, tingling of anterior upper legs.  Denies back  pain, injury, stool incontinence, fevers, chills, sputum production, n/v/d.    PAST MEDICAL HISTORY :  Past Medical History  Diagnosis Date  . Breast cancer     on Tamoxifen  . Interstitial cystitis     on chronic antibiotics  . Hyperlipidemia   . Frequent UTI     on prophylaxis  . 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 Arthritis     Spine  . CAD (coronary artery disease)     a. s/p CABG 7/13;   b.  LHC 12/01/11:  pLAD 70%, mLAD 40%, CFX 40-50% prior to takeoff of the OM2, oRCA occluded, mid vessel filled via R->R collaterals and distal vessel filled by L->R collaterals, S-OM1/OM2 (small and diffusely dz) with mid 90% stenosis, 90% at OM1 anastomotic site, continuation of OM2 occluded, S-Dx patent, S-PDA occluded, L-LAD ok, EF 55-65%  => Med Rx rec.  [name] Hx of echocardiogram     Echo 5/13: EF 60-65%, grade 2 diastolic dysfunction   Past Surgical History  Procedure Date  . Breast lumpectomy 04/2009    left  . Partial hysterectomy 1976    vaginal bleeding after 2nd child born  . Appendectomy 1976  . Cesarean section     x 2  . Muscle release     L neck and chest.  . Coronary artery bypass graft 08/18/2011    Procedure: CORONARY ARTERY BYPASS GRAFTING (CABG);  Surgeon: Alleen Borne, MD;  Location: [facility] OR;  Service: Open Heart Surgery;  Laterality: N/A;  Coronary Artery Bypass Graft times five utilizing the left internal mammary artery and the left greater saphenous vein harvested endoscopically.   Prior to Admission medications   Medication Sig Start Date End Date Taking? Authorizing Provider  aspirin 325 MG tablet Take 325 mg by mouth daily.   Yes Historical Provider, MD  atorvastatin (LIPITOR) 20 MG tablet Take 20 mg by mouth daily.   Yes Historical Provider, MD  carvedilol (COREG) 25 MG tablet Take 25 mg by mouth 2 (two) times daily. 12/20/11  Yes Vesta Mixer, MD  Docusate Calcium (STOOL SOFTENER PO) Take 2 capsules by mouth every morning.   Yes Historical Provider, MD  omeprazole  (PRILOSEC) 20 MG capsule Take 20 mg by mouth daily. 12/17/11  Yes Vesta Mixer, MD  oxybutynin (DITROPAN-XL) 10 MG 24 hr tablet Take 10 mg by mouth daily.   Yes Historical Provider, MD  Polyethyl Glycol-Propyl Glycol 0.4-0.3 % SOLN Place 1 drop into both eyes every morning.    Yes Historical Provider, MD  tamoxifen (NOLVADEX) 20 MG tablet Take 20 mg by mouth daily.   Yes Historical Provider, MD  trimethoprim (TRIMPEX) 100 MG tablet Take 100 mg by mouth daily.   Yes Historical Provider, MD  EPINEPHrine (EPIPEN 2-PAK) 0.3 mg/0.3 mL DEVI Inject 0.3 mg into the muscle.     Historical Provider, MD  nitroGLYCERIN (NITROSTAT) 0.4 MG SL tablet Place 0.4 mg under the tongue every 5 (five) minutes as needed. Chest pain    Historical Provider, MD   Allergies  Allergen Reactions  . Codeine Nausea And Vomiting       . Isosorbide     Extreme headaches  . Other Swelling    All Lip moisturizers except Vaseline.  . Phenothiazines Other (See Comments)    Makes her stop breathing.  . Talwin (Pentazocine) Nausea And Vomiting  . Yellow Jacket Venom     FAMILY HISTORY:  Family History  Problem Relation Age of Onset  . Cancer Mother 65    breast cancer  . Heart disease Father 85    MI at 28, CABG in 66's  . Hepatitis Father     C from blood transfusion  . Heart disease Brother     CABG in 78's  . Heart disease Paternal Aunt   . Heart disease Paternal Uncle   . Heart disease Paternal Grandfather   . Diabetes Neg Hx    SOCIAL HISTORY:  reports that she quit smoking about 10 years ago. She has never used smokeless tobacco. She reports that she drinks about 4.2 ounces of alcohol per week. She reports that she does not use illicit drugs.  REVIEW OF SYSTEMS:   See HPI for pertinent positives.  All other systems reviewed and are negative.    INTERVAL HISTORY:   VITAL SIGNS: Temp:  [98.2 F (36.8 C)] 98.2 F (36.8 C) (11/12 0837) Pulse Rate:  [101-114] 104  (11/12 1145) Resp:  [18-30] 27   (11/12 1145) BP: (131-151)/(82-104) 140/85 mmHg (11/12 1145) SpO2:  [91 %-100 %] 93 % (11/12 1145) Weight:  [140 lb (63.504 kg)] 140 lb (63.504 kg) (11/12 0834)  PHYSICAL EXAMINATION: General: wdwn adult female in NAD Neuro:  AAOx4, speech clear, MAE HEENT:  Mm pink/moist Neck:  Supple, no LAN, nml thyroid to palpation Cardiovascular:  s1s2 rrr, no m/r/g Lungs:  resp's even/non-labored, lungs bilaterally diminished, dull to percussion 3/4 way up bilaterally Abdomen:  Flat/soft, bsx4 active Musculoskeletal:  No acute deformities Skin:  Warm/dry, no edema   Lab 12/28/11 0905  NA 137  K 3.9  CL 101  CO2 --  BUN 15  CREATININE 1.00  GLUCOSE 134*    Lab 12/28/11 0905 12/28/11 0850  HGB 12.6 11.5*  HCT 37.0 35.5*  WBC -- 8.7  PLT -- 569*   Dg Chest Port 1 View  12/28/2011  *RADIOLOGY REPORT*  Clinical Data: Shortness of breath.  PORTABLE CHEST - 1 VIEW  Comparison: Two-view chest 09/21/2011.  Findings: The heart is obscured by a large left pleural effusion and associated airspace disease.  A smaller right pleural effusion is slightly worse than on the prior exam.  Right basilar airspace disease has progressed.  Moderate pulmonary vascular congestion is evident.  IMPRESSION:  1.  Significant increase and a now large left pleural effusion. 2.  Slight increase in a small right pleural effusion. 3.  Progressive bilateral airspace disease.  While this may represent atelectasis, infection is not excluded. 3.  Moderate pulmonary vascular congestion is new.   Original Report Authenticated By: Marin Roberts, M.D.     ASSESSMENT / PLAN:  Bilateral Pleural Effusions Dyspnea Assessment:  Hx of recent CABG 08/2011 and breast cancer s/p radiation/lumpectomy 2010 on tamoxifen.  Dyspnea in setting of large bilateral effusions L>R.  Free flowing on US exam.  CTA neg for PE.  DDx includes CHF s/p CABG, malignancy with hx of breast CA, infection (parapneumonic) but does not appear to be such  with no fevers, chills, etc.  Tachycardia most likely secondary to effusions / pulmonary stress.   Plan: -now thoracentesis for diagnostic eval and therapeutic benefit -f/u cxr post thoracentesis -empiric abx but likely can narrow quickly -fluid for cytology, culture, etc   Pulmonary and Critical Care Medicine [facility] Pager: [phone]  12/28/2011, 1:14 PM  Will thora the left side today and sent fluid for evaluation then will likely need a thora on the right in AM.  Fluid analysis and f/u CXR.  Patient seen and examined, agree with above note.  I dictated the care and orders written for this patient under my direction.  Koren Bound, M.D. [phone]

## 2011-12-28 NOTE — ED Notes (Signed)
Dr. Caporossi at the bedside. 

## 2011-12-29 ENCOUNTER — Inpatient Hospital Stay (HOSPITAL_COMMUNITY): Payer: BC Managed Care – PPO

## 2011-12-29 DIAGNOSIS — J90 Pleural effusion, not elsewhere classified: Secondary | ICD-10-CM

## 2011-12-29 LAB — BASIC METABOLIC PANEL
BUN: 13 mg/dL (ref 6–23)
CO2: 21 mEq/L (ref 19–32)
Calcium: 8 mg/dL — ABNORMAL LOW (ref 8.4–10.5)
Creatinine, Ser: 0.71 mg/dL (ref 0.50–1.10)
GFR calc non Af Amer: 89 mL/min — ABNORMAL LOW (ref >90)
Glucose, Bld: 116 mg/dL — ABNORMAL HIGH (ref 70–99)
Sodium: 131 mEq/L — ABNORMAL LOW (ref 135–145)

## 2011-12-29 LAB — PROTEIN, BODY FLUID: Total protein, fluid: 3.7 g/dL

## 2011-12-29 LAB — CBC
MCH: 25.9 pg — ABNORMAL LOW (ref 26.0–34.0)
MCHC: 31.7 g/dL (ref 30.0–36.0)
MCV: 81.5 fL (ref 78.0–100.0)
Platelets: 462 10*3/uL — ABNORMAL HIGH (ref 150–400)
RBC: 3.79 MIL/uL — ABNORMAL LOW (ref 3.87–5.11)
RDW: 14.5 % (ref 11.5–15.5)

## 2011-12-29 LAB — BODY FLUID CELL COUNT WITH DIFFERENTIAL
Eos, Fluid: 0 %
Lymphs, Fluid: 67 %
Monocyte-Macrophage-Serous Fluid: 31 % — ABNORMAL LOW (ref 50–90)

## 2011-12-29 LAB — LACTATE DEHYDROGENASE, PLEURAL OR PERITONEAL FLUID: LD, Fluid: 233 U/L — ABNORMAL HIGH (ref 3–23)

## 2011-12-29 LAB — PH, BODY FLUID: pH, Fluid: 8

## 2011-12-29 MED ORDER — POLYETHYLENE GLYCOL 3350 17 G PO PACK
17.0000 g | PACK | Freq: Once | ORAL | Status: AC
  Administered 2011-12-29: 17 g via ORAL
  Filled 2011-12-29: qty 1

## 2011-12-29 MED ORDER — ENSURE COMPLETE PO LIQD
237.0000 mL | Freq: Two times a day (BID) | ORAL | Status: DC
  Administered 2011-12-29: 237 mL via ORAL

## 2011-12-29 NOTE — Procedures (Signed)
Thoracentesis Procedure Note  Pre-operative Diagnosis: Right pleural effusion  Post-operative Diagnosis: same  Indications: 66 yo female with hx of smoking, breast cancer, recent CABG with progressive dyspnea, cough, sweats, weakness and b/l pleural effusions.  Procedure Details  Consent: Informed consent was obtained. Risks of the procedure were discussed including: infection, bleeding, pain, pneumothorax.  Under sterile conditions the patient was positioned. Betadine solution and sterile drapes were utilized.  1% plain lidocaine was used to anesthetize the right 7 rib space. Fluid was obtained without any difficulties and minimal blood loss.  A dressing was applied to the wound and wound care instructions were provided.   Findings 850 ml of tan to red colored pleural fluid was obtained. A sample was sent for glucose, protein, LDH, cell count with differential, gram stain and culture, AFB and fungal culture, adenosine deaminase, and cytology.  Complications:  None; patient tolerated the procedure well.          Condition: stable  Plan A follow up chest x-ray was ordered.  Performed by Leodis Sias, MD using ultrasound guidance.  Attending Attestation: I was present for the entire procedure.  Coralyn Helling, MD [facility] Pulmonary/Critical Care 12/29/2011, 11:46 AM Pager:  [phone] After 3pm call: [phone]

## 2011-12-29 NOTE — Progress Notes (Signed)
INITIAL ADULT NUTRITION ASSESSMENT Date: 12/29/2011   Time: 11:01 AM  INTERVENTION:  Ensure Complete supplement twice daily between meals (350 kcals, 13 gm protein per 8 fl oz bottle) RD to follow for nutrition care plan  DOCUMENTATION CODES Per approved criteria  -Severe malnutrition in the context of acute illness or injury   Reason for Assessment: Consult, Malnutrition Screening Tool Report  ASSESSMENT: Female 66 y.o.  Dx: Pleural effusion  Hx:  Past Medical History  Diagnosis Date  . Interstitial cystitis     on chronic antibiotics  . Hyperlipidemia   . Frequent UTI     on prophylaxis  . 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 CAD (coronary artery disease)     a. s/p CABG 7/13;   b.  LHC 12/01/11:  pLAD 70%, mLAD 40%, CFX 40-50% prior to takeoff of the OM2, oRCA occluded, mid vessel filled via R->R collaterals and distal vessel filled by L->R collaterals, S-OM1/OM2 (small and diffusely dz) with mid 90% stenosis, 90% at OM1 anastomotic site, continuation of OM2 occluded, S-Dx patent, S-PDA occluded, L-LAD ok, EF 55-65%  => Med Rx rec.  [name] Hx of echocardiogram     Echo 5/13: EF 60-65%, grade 2 diastolic dysfunction  . Hypercholesterolemia   . Pleural effusion 12/28/11  . Shortness of breath 12/28/11    related to PE  . GERD (gastroesophageal reflux disease)   . Arthritis     "just a little; lower back" (12/28/11)  . Gout attack 08/2011    related to "stress post OHS"  . Depression   . Breast cancer     "left"; on Tamoxifen  . S/P thoracentesis 12/28/11    "for pleural effusion" (12/28/2011)    Related Meds:     . aspirin  325 mg Oral Daily  . atorvastatin  20 mg Oral QPC supper  . carvedilol  25 mg Oral BID WC  . docusate sodium  100 mg Oral BID  . enoxaparin (LOVENOX) injection  40 mg Subcutaneous Q24H  . levofloxacin (LEVAQUIN) IV  750 mg Intravenous Q24H  . oxybutynin  10 mg Oral Daily  . pantoprazole  40 mg Oral Daily  . polyvinyl alcohol  1 drop Both Eyes Daily    . sodium chloride  3 mL Intravenous Q12H  . sodium chloride  3 mL Intravenous Q12H  . tamoxifen  20 mg Oral Daily  . trimethoprim  100 mg Oral Daily  . [DISCONTINUED] Polyethyl Glycol-Propyl Glycol  1 drop Both Eyes q morning - 10a    Ht: 5\' 6"  (167.6 cm)  Wt: 141 lb 8 oz (64.184 kg)  Wt Readings from Last 10 Encounters:  12/29/11 141 lb 8 oz (64.184 kg)  12/17/11 141 lb 4 oz (64.071 kg)  12/03/11 138 lb 12.8 oz (62.959 kg)  12/01/11 138 lb (62.596 kg)  12/01/11 138 lb (62.596 kg)  11/29/11 138 lb 12.8 oz (62.959 kg)  09/30/11 137 lb 8 oz (62.37 kg)  09/22/11 136 lb (61.689 kg)  09/03/11 131 lb (59.421 kg)  08/25/11 132 lb 11.5 oz (60.2 kg)    Ideal Wt: 59 kg % Ideal Wt: 108%  Usual Wt: 140 lb % Usual Wt: 101%  Body mass index is 22.84 kg/(m^2).  Food/Nutrition Related Hx: recent weight lost without trying (patient unsure) and decreased appetite per admission nutrition screen  Labs:  CMP     Component Value Date/Time   NA 131* 12/29/2011 0218   NA 141 12/17/2011 1144   K 3.8 12/29/2011  0218   CL 97 12/29/2011 0218   CO2 21 12/29/2011 0218   GLUCOSE 116* 12/29/2011 0218   GLUCOSE 78 12/17/2011 1144   BUN 13 12/29/2011 0218   BUN 15 12/17/2011 1144   CREATININE 0.71 12/29/2011 0218   CALCIUM 8.0* 12/29/2011 0218   PROT 7.3 12/28/2011 1553   PROT 6.7 12/17/2011 1144   ALBUMIN 3.8 09/30/2011 1307   AST 19 12/17/2011 1144   ALT 11 12/17/2011 1144   ALKPHOS 63 12/17/2011 1144   BILITOT 0.2 12/17/2011 1144   GFRNONAA 89* 12/29/2011 0218   GFRAA >90 12/29/2011 0218    Diet Order: Cardiac  Supplements/Tube Feeding: N/A  IVF: N/A  Estimated Nutritional Needs:   Kcal: 1800-2000 Protein: 85-100 gm Fluid: 1.8-2.0 L  Patient admitted for SOB, cough being significantly worse; in ER was found to have large L sided pleural effusion on chest X-ray; states her appetite was poor PTA x 1 week -- was consuming 3 meals per day, however, only 1-2 food items (ie toast for  breakfast); she does have visible moderate muscle wasting to temples, shoulders & clavicles; amenable to Ensure Complete supplements during hospitalization -- RD to order.  Patient meets criteria for severe malnutrition in the context of acute illness due to < 50% intake of estimated energy requirement for > 5 days and moderate muscle mass.  NUTRITION DIAGNOSIS: -Increased nutrient needs (NI-5.1).  Status: Ongoing  RELATED TO: protein & energy repletion  AS EVIDENCE BY: estimated nutrition needs  MONITORING/EVALUATION(Goals): Goal: Oral intake with meals & supplements to meet >/= 90% of estimated nutrition needs Monitor: PO & supplemental intake, weight, labs, I/O's  EDUCATION NEEDS: -Education needs addressed -- high protein foods/oral nutrition supplements  Kirkland Hun, RD, LDN Pager #: [phone] After-Hours Pager #: [phone]

## 2011-12-29 NOTE — Progress Notes (Signed)
[facility] PROGRESS NOTE  [name] [name] UJW:[id number] DOB: [birth year] DOA: 12/28/2011 PCP: Lavonda Jumbo, MD  Assessment/Plan: 1. Pleural effusion : Per Pulmonology at this point.  Thoracentesis performed and currently being analyzed.  Once cleared from pulmonary will plan on discharging. Etiology uncertain at this juncture.  Will continue antibiotics at this juncture although patient afebrile and WBC within normal limits.  May consider changing regimen or discontinuing pending clinical course. 2. Tachycardia: Resolved, CT angiogram negative.  Yesterday's ekg was Sinus tachycardia 3. Dyspnea: resolving and was likely secondary to # 1. Pulmonology on board. 4. CAD s/p CABG: Continue aspirin, atorvastatin 5. H/o breast cancer: continue tamoxifen.  Reportedly per patient she had lumpectomy and radiation therapy. 6. Htn: continue current regimen will continue to monitor.  Code Status: full Family Communication: None at bedside Disposition Plan: Pending hospital course.  Once cleared from pulmonary standpoint will consider discharge.   Consultants:  Pulmonary : Dr. Craige Cotta  Procedures:  Thoracentesis 12/28/11  Antibiotics:  levaquin  trimethoprim  HPI/Subjective: Patient mentions that after thoracentesis she feels much better.  No new complaints reported to me today.  No acute issues overnight reported.  Objective: Filed Vitals:   12/28/11 2116 12/29/11 0617 12/29/11 1400 12/29/11 1703  BP: 113/72 96/65 112/77 121/78  Pulse: 108 77 100   Temp: 98.9 F (37.2 C) 97.4 F (36.3 C) 100.2 F (37.9 C) 97.9 F (36.6 C)  TempSrc: Oral Oral Oral   Resp: 15 16 16    Height:      Weight:  64.184 kg (141 lb 8 oz)    SpO2: 94% 91% 96%     Intake/Output Summary (Last 24 hours) at 12/29/11 1956 Last data filed at 12/29/11 1500  Gross per 24 hour  Intake    420 ml  Output      0 ml  Net    420 ml   Filed Weights   12/28/11 0834 12/28/11 0837 12/29/11 0617  Weight:  63.504 kg (140 lb) 64.229 kg (141 lb 9.6 oz) 64.184 kg (141 lb 8 oz)    Exam:   General:  Pt in NAD, Alert and Oriented x 3  Cardiovascular: RRR, No MRG  Respiratory: CTA BL, decreased breath sounds at bases  Abdomen: soft, NT  Data Reviewed: Basic Metabolic Panel:  Lab 12/29/11 5621 12/28/11 1553 12/28/11 0905  NA 131* -- 137  K 3.8 -- 3.9  CL 97 -- 101  CO2 21 -- --  GLUCOSE 116* -- 134*  BUN 13 -- 15  CREATININE 0.71 0.68 1.00  CALCIUM 8.0* -- --  MG -- -- --  PHOS -- -- --   Liver Function Tests:  Lab 12/28/11 1553  AST --  ALT --  ALKPHOS --  BILITOT --  PROT 7.3  ALBUMIN --   No results found for this basename: LIPASE:5,AMYLASE:5 in the last 168 hours No results found for this basename: AMMONIA:5 in the last 168 hours CBC:  Lab 12/29/11 0218 12/28/11 1553 12/28/11 0905 12/28/11 0850  WBC 7.6 9.2 -- 8.7  NEUTROABS -- -- -- 6.9  HGB 9.8* 11.3* 12.6 11.5*  HCT 30.9* 35.9* 37.0 35.5*  MCV 81.5 83.1 -- 82.2  PLT 462* 583* -- 569*   Cardiac Enzymes:  Lab 12/29/11 0218 12/28/11 1938 12/28/11 1531  CKTOTAL -- -- --  CKMB -- -- --  CKMBINDEX -- -- --  TROPONINI <0.30 <0.30 <0.30   BNP (last 3 results)  Basename 12/28/11 1109  PROBNP 828.2*   CBG: No  results found for this basename: GLUCAP:5 in the last 168 hours  Recent Results (from the past 240 hour(s))  AFB CULTURE WITH SMEAR     Status: Normal (Preliminary result)   Collection Time   12/28/11  3:30 PM      Component Value Range Status Comment   Specimen Description PLEURAL FLUID LEFT   Final    Special Requests   Final    ACID FAST SMEAR NO ACID FAST BACILLI SEEN   Final    Culture     Final    Value: CULTURE WILL BE EXAMINED FOR 6 WEEKS BEFORE ISSUING A FINAL REPORT   Report Status PENDING   Incomplete   BODY FLUID CULTURE     Status: Normal (Preliminary result)   Collection Time   12/28/11  3:30 PM      Component Value Range Status Comment   Specimen Description PLEURAL FLUID  LEFT   Final    Special Requests   Final    Gram Stain     Final    Value: RARE WBC PRESENT, PREDOMINANTLY PMN     NO ORGANISMS SEEN   Culture NO GROWTH   Final    Report Status PENDING   Incomplete   FUNGUS CULTURE W SMEAR     Status: Normal (Preliminary result)   Collection Time   12/28/11  3:30 PM      Component Value Range Status Comment   Specimen Description PLEURAL FLUID LEFT   Final    Special Requests   Final    Fungal Smear NO YEAST OR FUNGAL ELEMENTS SEEN   Final    Culture CULTURE IN PROGRESS FOR FOUR WEEKS   Final    Report Status PENDING   Incomplete      Studies: Ct Angio Chest Pe W/cm &/or Wo Cm  12/28/2011  *RADIOLOGY REPORT*  Clinical Data: Shortness of breath and tachycardia.  History breast cancer and coronary artery disease.  CT ANGIOGRAPHY CHEST  Technique:  Multidetector CT imaging of the chest using the standard protocol during bolus administration of intravenous contrast. Multiplanar reconstructed images including MIPs were obtained and reviewed to evaluate the vascular anatomy.  Contrast: 80mL OMNIPAQUE IOHEXOL 350 MG/ML SOLN  Comparison: Chest x-ray 12/28/2011.  Findings: Pulmonary arterial opacification is satisfactory.  The study is mildly degraded by patient breathing motion.  No focal filling defects are evident to suggest pulmonary embolus.  Large bilateral pleural effusions are present, left greater than right.  There is associated collapse of proximally half the left lower lobe.  There is a linear collapse in the left lower lobe and left upper lobe are associated with occluded airways, likely representing mucous plugging.  The heart is mildly enlarged.  Extensive coronary artery calcifications are evident.  The patient is status post median sternotomy for CABG.  The bone windows demonstrate exaggeration of the thoracic kyphosis. 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  IMPRESSION:  1.  New large bilateral pleural effusions. 2.  No definite  pulmonary embolus.  The study is mildly degraded by patient breathing motion. 3.  Areas of linear atelectasis in the left upper and lower lobe are associated with occluded bronchi, likely representing mucus plugging. 4.  Coronary artery disease status post CABG.   Original Report Authenticated By: Marin Roberts, M.D.    Dg Chest Port 1 View  12/29/2011  *RADIOLOGY REPORT*  Clinical Data: Follow-up right thoracentesis.  PORTABLE CHEST - 1 VIEW  Comparison: Radiographs and CT 12/28/2011.  Findings: 1151 hours.  The right pleural effusion has significantly decreased in volume.  There is a persistent moderate-sized left pleural effusion.  No pneumothorax is seen.  There is improved aeration of the right lung base. The heart size and mediastinal contours are stable status post CABG.  IMPRESSION: Significantly improved right pleural effusion following thoracentesis.  No evidence of pneumothorax.   Original Report Authenticated By: Carey Bullocks, M.D.    Dg Chest Port 1 View  12/28/2011  *RADIOLOGY REPORT*  Clinical Data: Post thoracentesis  PORTABLE CHEST - 1 VIEW  Comparison: Portable exam 1502 hours compared to 12/28/2011 at 0918 hours  Findings: Normal heart size post CABG. Mediastinal contours and pulmonary vascularity normal. Bibasilar effusions atelectasis, decreased on the left since earlier study. No pneumothorax identified. Upper lungs clear. Bones unremarkable.  IMPRESSION: No pneumothorax following thoracentesis. Bibasilar effusions atelectasis with decrease in left pleural effusion since earlier study.   Original Report Authenticated By: Ulyses Southward, M.D.    Dg Chest Port 1 View  12/28/2011  *RADIOLOGY REPORT*  Clinical Data: Shortness of breath.  PORTABLE CHEST - 1 VIEW  Comparison: Two-view chest 09/21/2011.  Findings: The heart is obscured by a large left pleural effusion and associated airspace disease.  A smaller right pleural effusion is slightly worse than on the prior exam.  Right  basilar airspace disease has progressed.  Moderate pulmonary vascular congestion is evident.  IMPRESSION:  1.  Significant increase and a now large left pleural effusion. 2.  Slight increase in a small right pleural effusion. 3.  Progressive bilateral airspace disease.  While this may represent atelectasis, infection is not excluded. 3.  Moderate pulmonary vascular congestion is new.   Original Report Authenticated By: Marin Roberts, M.D.     Scheduled Meds:   . aspirin  325 mg Oral Daily  . atorvastatin  20 mg Oral QPC supper  . carvedilol  25 mg Oral BID WC  . docusate sodium  100 mg Oral BID  . enoxaparin (LOVENOX) injection  40 mg Subcutaneous Q24H  . feeding supplement  237 mL Oral BID BM  . levofloxacin (LEVAQUIN) IV  750 mg Intravenous Q24H  . oxybutynin  10 mg Oral Daily  . pantoprazole  40 mg Oral Daily  . polyvinyl alcohol  1 drop Both Eyes Daily  . sodium chloride  3 mL Intravenous Q12H  . sodium chloride  3 mL Intravenous Q12H  . tamoxifen  20 mg Oral Daily  . trimethoprim  100 mg Oral Daily   Continuous Infusions:   Principal Problem:  *Pleural effusion Active Problems:  Breast cancer  Pure hypercholesterolemia  Coronary artery disease  Tachycardia  Dyspnea  HTN (hypertension)    Time spent: > 35 minutes    Penny Pia  [facility] Pager [phone]. If 8PM-8AM, please contact night-coverage at [email or web address], password [facility] 12/29/2011, 7:56 PM  LOS: 1 day

## 2011-12-29 NOTE — ED Provider Notes (Signed)
I saw and evaluated the patient, reviewed the resident's note and I agree with the findings and plan.  Cheri Guppy, MD 12/29/11 [phone]

## 2011-12-29 NOTE — Progress Notes (Signed)
PULMONARY  / CRITICAL CARE MEDICINE  Name: [name] [name] MRN: [id number] DOB: March 10, 1945  Primary Cardiology:  Dr. Melburn Popper    LOS: 1  REFERRING PROVIDER:  Dr. Jerral Ralph - [facility]  CHIEF COMPLAINT:  Shortness of breath / Cough  BRIEF PATIENT DESCRIPTION:   66 yo female former smoker admitted on 12/28/2011 with progressive dry cough, dyspnea, and fatigue.  Found to have Lt > Rt pleural effusions, and PCCM consulted 11/12.  Significant PMHx of Breast cancer (s/p lumpectomy and XRT 2010), CAD s/p CABG July 2013, HTN, Diastolic CHF, HLD, Interstitial cystitis on chronic ABx, GERD, Depression  LINES / TUBES:   CULTURES: 11/12 Pleural Fluid>>> 11/12 Pleural AFB / Fungal>>>  ANTIBIOTICS: Trimethoprim (chronic UTI proph)>>> Levaquin (pulm empiric) 11/12>>>  SIGNIFICANT EVENTS:  11/12 - Admit with dyspnea, L thora  LEVEL OF CARE:  Medical Floor PRIMARY SERVICE:  [facility] CONSULTANTS:  PCCM CODE STATUS:  Full DIET: Heart healthy DVT Px:  Lovenox GI Px:  Protonix   INTERVAL HISTORY:  Breathing improved, but still feels short of breath with talking.  Feels weak.  C/o bloating and difficulty with bowel movements.  Feels sweaty.  VITAL SIGNS: Temp:  [97.4 F (36.3 C)-98.9 F (37.2 C)] 97.4 F (36.3 C) (11/13 0617) Pulse Rate:  [77-108] 77  (11/13 0617) Resp:  [15-30] 16  (11/13 0617) BP: (96-153)/(65-104) 96/65 mmHg (11/13 0617) SpO2:  [91 %-100 %] 91 % (11/13 0617) Weight:  [141 lb 8 oz (64.184 kg)] 141 lb 8 oz (64.184 kg) (11/13 0617)  FIO2 at 2 L/m  PHYSICAL EXAMINATION: General: Increased WOB with movement/talking Neuro:  Normal strength HEENT: no LAN Cardiovascular:  s1s2 regular, no murmur Lungs:  Decreased respiratory excursion b/l, decreased BS at bases b/l, basilar rales, no wheeze Abdomen:  Soft, mild distention, increased tympany Musculoskeletal:  No edema Skin: no rashes  08/16/11 PFT>>FEV1 2.95 (112%), FEV1% 88, TLC 6.14 (114%), DLCO 84%, no BD  12/28/11  CT chest>>large b/l effusions Lt > Rt, b/l ATX, no PE  12/28/11 Lt Thoracentesis>>1400 ml fluid, protein 4.3, LDH 246, WBC 1366 (2N, 94L, 74M)   Lab 12/29/11 0218 12/28/11 1553 12/28/11 0905  NA 131* -- 137  K 3.8 -- 3.9  CL 97 -- 101  CO2 21 -- --  BUN 13 -- 15  CREATININE 0.71 0.68 1.00  GLUCOSE 116* -- 134*    Lab 12/29/11 0218 12/28/11 1553 12/28/11 0905 12/28/11 0850  HGB 9.8* 11.3* 12.6 --  HCT 30.9* 35.9* 37.0 --  WBC 7.6 9.2 -- 8.7  PLT 462* 583* -- 569*     ASSESSMENT / PLAN:  A: Lymphocyte predominant, exudative Lt pleural effusion in former smoker with hx of breast cancer and CAD s/p recent CABG. P: F/u Lt pleural fluid cytology, micro results Continue levaquin pending culture results Check quantiferon gold  A: Rt pleural effusion. P: Assess with bedside u/s and the likely proceed with diagnostic/therapeutic thoracentesis  A: Progressive dyspnea. Likely from pleural effusions, diastolic dysfunction. P: F/u CXR 11/14 Adjust oxygen to keep SpO2 > 92% May need repeat Echo>>defer to primary team  A: Abdominal bloating, constipation. P: Defer to primary team whether additional abdominal imaging studies are needed  Summary: She has Lt lymphocyte predominant exudative effusion.  From what I can tell she has not been on diuretics, so I don't think this is transformation of transudate due to diuresis.  She has hx of smoking and breast cancer, and also c/o abdominal bloating; cancer is on differential.  Not sure  if Rt pleural effusion process is necessarily related to Lt pleural effusion>>will plan to proceed with rt thoracentesis after assessing with bedside u/s.  Tuberculosis pleuritis is less likely, but still possible>>will check quantiferon gold.  Coralyn Helling, MD [facility] Pulmonary/Critical Care 12/29/2011, 9:18 AM Pager:  [phone] After 3pm call: [phone]

## 2011-12-30 ENCOUNTER — Inpatient Hospital Stay (HOSPITAL_COMMUNITY): Payer: BC Managed Care – PPO

## 2011-12-30 DIAGNOSIS — C50919 Malignant neoplasm of unspecified site of unspecified female breast: Secondary | ICD-10-CM

## 2011-12-30 NOTE — Progress Notes (Signed)
Upon assessment, pt complains of short "coughing attack" that pt states made her "feel like I was choking to death". Pt given small sips of water and repositioned with HOB elevated. Instructed pt to call out if another episode occurs. Will continue to monitor closely.

## 2011-12-30 NOTE — Progress Notes (Signed)
PULMONARY  / CRITICAL CARE MEDICINE  Name: [name] [name] MRN: [id number] DOB: 1945/07/22  Primary Cardiology:  Dr. Melburn Popper    LOS: 2  REFERRING PROVIDER:  Dr. Jerral Ralph - [facility]  CHIEF COMPLAINT:  Shortness of breath / Cough  BRIEF PATIENT DESCRIPTION:   66 yo female former smoker admitted on 12/28/2011 with progressive dry cough, dyspnea, and fatigue.  Found to have Lt > Rt pleural effusions, and PCCM consulted 11/12.  Significant PMHx of Breast cancer (s/p lumpectomy and XRT 2010), CAD s/p CABG July 2013, HTN, Diastolic CHF, HLD, Interstitial cystitis on chronic ABx, GERD, Depression  LINES / TUBES:   CULTURES: 11/12 Pleural Fluid>>> 11/12 Pleural AFB / Fungal>>>no AFB seen on smear>>>> 11/13 pleural fluid R>>>  ANTIBIOTICS: Trimethoprim (chronic UTI proph)>>> Levaquin (pulm empiric) 11/12>>>  SIGNIFICANT EVENTS:  11/12 - Admit with dyspnea, L thora 11/13 R thora   LEVEL OF CARE:  Medical Floor PRIMARY SERVICE:  [facility] CONSULTANTS:  PCCM CODE STATUS:  Full DIET: Heart healthy DVT Px:  Lovenox GI Px:  Protonix   INTERVAL HISTORY:  Breathing much improved.  Abd feels bloated, c/o constipation.  Ready to go home as her granddaughter was born 11/13 (during thoracentesis!).   VITAL SIGNS: Temp:  [97.9 F (36.6 C)-100.2 F (37.9 C)] 98.7 F (37.1 C) (11/14 0545) Pulse Rate:  [84-100] 96  (11/14 0754) Resp:  [15-16] 15  (11/14 0545) BP: (106-121)/(69-78) 111/74 mmHg (11/14 0754) SpO2:  [92 %-98 %] 92 % (11/14 0545) Weight:  [139 lb 11.2 oz (63.368 kg)] 139 lb 11.2 oz (63.368 kg) (11/14 0545)  FIO2 at 2 L/m  PHYSICAL EXAMINATION: General: pleasant, NAD,  Neuro: awake, alert, normal strength HEENT: no LAN Cardiovascular:  s1s2 regular, no murmur Lungs:  resps even non labored, diminished bases L>R but ess clear otherwise  Abdomen:  Soft, mild distention, increased tympany Musculoskeletal:  No edema Skin: no rashes  08/16/11 PFT>>FEV1 2.95 (112%), FEV1% 88,  TLC 6.14 (114%), DLCO 84%, no BD  12/28/11 CT chest>>large b/l effusions Lt > Rt, b/l ATX, no PE  12/28/11 Lt Thoracentesis>>1400 ml fluid, protein 4.3, LDH 246, WBC 1366 (2N, 94L, 58M)  12/29/11 Rt Thoracentesis>>850 ml fluid, glucose 124, protein 3.7, LDH 233, WBC 1337 (2N, 67L, 10M)   Lab 12/29/11 0218 12/28/11 1553 12/28/11 0905  NA 131* -- 137  K 3.8 -- 3.9  CL 97 -- 101  CO2 21 -- --  BUN 13 -- 15  CREATININE 0.71 0.68 1.00  GLUCOSE 116* -- 134*    Lab 12/29/11 0218 12/28/11 1553 12/28/11 0905 12/28/11 0850  HGB 9.8* 11.3* 12.6 --  HCT 30.9* 35.9* 37.0 --  WBC 7.6 9.2 -- 8.7  PLT 462* 583* -- 569*   CXR 12/30/11>>Lt > Rt effusions   ASSESSMENT / PLAN:  A: Lymphocyte predominant, exudative Lt pleural effusion in former smoker with hx of breast cancer and CAD s/p recent CABG.   Initial path review on L thora neg P: Await cytology  Continue levaquin pending culture results Quantiferon gold pending   A: Lymphocyte predominant, exudative Rt pleural effusion. 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 Likely from pleural effusions, diastolic dysfunction.  Improved 11/14. P: F/u CXR  intermittently Adjust oxygen to keep SpO2 > 92% May need repeat Echo>>defer to primary team  A: Abdominal bloating, constipation. P: Defer to primary  team whether additional abdominal imaging studies are needed  Summary: S/p R thoracentesis 11/13 with initial path smear suspicious for non small cell lung ca.  Awaiting cytology.  Continue abx for now although low threshold to d/c.  Quantiferon gold pending.  May be best to keep in hospital overnight to monitor for recurrence of effusion, and f/u with fluid cytology.  Main concern is that she has malignancy causing this.  Danford Bad, NP 12/30/2011  9:59 AM Pager: [phone] or [phone]  *Care during the described time interval was provided by me and/or other providers on the critical care team. I have reviewed this patient's available data, including medical history, events of note, physical examination and test results as part of my evaluation.  Coralyn Helling, MD [facility] Pulmonary/Critical Care 12/30/2011, 1:55 PM Pager:  [phone] After 3pm call: [phone]

## 2011-12-30 NOTE — Progress Notes (Signed)
[facility] PROGRESS NOTE  [name] [name] WUJ:[id number] DOB: September 25, 1945 DOA: 12/28/2011 PCP: Lavonda Jumbo, MD  Assessment/Plan: 1. Pleural effusion : Per Pulmonology at this point.  Thoracentesis performed and currently being analyzed, cytology pending currently.  Once cleared from pulmonary will plan on discharging. Etiology uncertain at this juncture but suspecting malignancy.  Will continue antibiotics at this juncture although patient afebrile and WBC within normal limits.  Will observe overnight to monitor for recurrence of effusion. 2. Tachycardia: Resolved, CT angiogram negative.  Yesterday's ekg was Sinus tachycardia 3. Dyspnea: resolving and was likely secondary to # 1. Pulmonology on board. 4. CAD s/p CABG: Continue aspirin, atorvastatin 5. H/o breast cancer: continue tamoxifen.  Reportedly per patient she had lumpectomy and radiation therapy 3 years ago. 6. Htn: continue current regimen will continue to monitor.  Code Status: full Family Communication: None at bedside Disposition Plan: Pending hospital course.  Once cleared from pulmonary standpoint will consider discharge.   Consultants:  Pulmonary : Dr. Craige Cotta  Procedures:  Thoracentesis 12/28/11  Antibiotics:  levaquin  trimethoprim  HPI/Subjective: Patient mentions that after thoracentesis she feels much better.  No new complaints reported to me today.  No acute issues overnight reported.  Off of supplemental oxygen currently and denies any shortness of breath.  Objective: Filed Vitals:   12/29/11 2108 12/30/11 0545 12/30/11 0754 12/30/11 1406  BP: 116/77 106/69 111/74 103/67  Pulse: 84 88 96 85  Temp: 99.3 F (37.4 C) 98.7 F (37.1 C)  98.7 F (37.1 C)  TempSrc: Oral Oral  Oral  Resp: 16 15  20   Height:      Weight:  63.368 kg (139 lb 11.2 oz)    SpO2: 98% 92%  92%    Intake/Output Summary (Last 24 hours) at 12/30/11 1707 Last data filed at 12/30/11 1300  Gross per 24 hour  Intake     480 ml  Output      0 ml  Net    480 ml   Filed Weights   12/28/11 0837 12/29/11 0617 12/30/11 0545  Weight: 64.229 kg (141 lb 9.6 oz) 64.184 kg (141 lb 8 oz) 63.368 kg (139 lb 11.2 oz)    Exam:   General:  Pt in NAD, Alert and Oriented x 3  Cardiovascular: RRR, No MRG  Respiratory: CTA BL, decreased breath sounds at bases, no wheezes, speaking in full sentences  Abdomen: soft, NT  Data Reviewed: Basic Metabolic Panel:  Lab 12/29/11 9562 12/28/11 1553 12/28/11 0905  NA 131* -- 137  K 3.8 -- 3.9  CL 97 -- 101  CO2 21 -- --  GLUCOSE 116* -- 134*  BUN 13 -- 15  CREATININE 0.71 0.68 1.00  CALCIUM 8.0* -- --  MG -- -- --  PHOS -- -- --   Liver Function Tests:  Lab 12/28/11 1553  AST --  ALT --  ALKPHOS --  BILITOT --  PROT 7.3  ALBUMIN --   No results found for this basename: LIPASE:5,AMYLASE:5 in the last 168 hours No results found for this basename: AMMONIA:5 in the last 168 hours CBC:  Lab 12/29/11 0218 12/28/11 1553 12/28/11 0905 12/28/11 0850  WBC 7.6 9.2 -- 8.7  NEUTROABS -- -- -- 6.9  HGB 9.8* 11.3* 12.6 11.5*  HCT 30.9* 35.9* 37.0 35.5*  MCV 81.5 83.1 -- 82.2  PLT 462* 583* -- 569*   Cardiac Enzymes:  Lab 12/29/11 0218 12/28/11 1938 12/28/11 1531  CKTOTAL -- -- --  CKMB -- -- --  CKMBINDEX -- -- --  TROPONINI <0.30 <0.30 <0.30   BNP (last 3 results)  Basename 12/28/11 1109  PROBNP 828.2*   CBG: No results found for this basename: GLUCAP:5 in the last 168 hours  Recent Results (from the past 240 hour(s))  AFB CULTURE WITH SMEAR     Status: Normal (Preliminary result)   Collection Time   12/28/11  3:30 PM      Component Value Range Status Comment   Specimen Description PLEURAL FLUID LEFT   Final    Special Requests   Final    ACID FAST SMEAR NO ACID FAST BACILLI SEEN   Final    Culture     Final    Value: CULTURE WILL BE EXAMINED FOR 6 WEEKS BEFORE ISSUING A FINAL REPORT   Report Status PENDING   Incomplete   BODY FLUID CULTURE      Status: Normal (Preliminary result)   Collection Time   12/28/11  3:30 PM      Component Value Range Status Comment   Specimen Description PLEURAL FLUID LEFT   Final    Special Requests   Final    Gram Stain     Final    Value: RARE WBC PRESENT, PREDOMINANTLY PMN     NO ORGANISMS SEEN   Culture NO GROWTH 2 DAYS   Final    Report Status PENDING   Incomplete   FUNGUS CULTURE W SMEAR     Status: Normal (Preliminary result)   Collection Time   12/28/11  3:30 PM      Component Value Range Status Comment   Specimen Description PLEURAL FLUID LEFT   Final    Special Requests   Final    Fungal Smear NO YEAST OR FUNGAL ELEMENTS SEEN   Final    Culture CULTURE IN PROGRESS FOR FOUR WEEKS   Final    Report Status PENDING   Incomplete   BODY FLUID CULTURE     Status: Normal (Preliminary result)   Collection Time   12/29/11 11:40 AM      Component Value Range Status Comment   Specimen Description PLEURAL FLUID RIGHT   Final    Special Requests   Final    Gram Stain     Final    Value: NO WBC SEEN     NO ORGANISMS SEEN   Culture NO GROWTH   Final    Report Status PENDING   Incomplete   AFB CULTURE WITH SMEAR     Status: Normal (Preliminary result)   Collection Time   12/29/11 11:40 AM      Component Value Range Status Comment   Specimen Description PLEURAL FLUID RIGHT   Final    Special Requests   Final    ACID FAST SMEAR NO ACID FAST BACILLI SEEN   Final    Culture     Final    Value: CULTURE WILL BE EXAMINED FOR 6 WEEKS BEFORE ISSUING A FINAL REPORT   Report Status PENDING   Incomplete      Studies: Dg Chest 2 View  12/30/2011  *RADIOLOGY REPORT*  Clinical Data: Pleural effusions, shortness of breath  CHEST - 2 VIEW  Comparison: 12/29/2011  Findings: The cardiac shadow is stable.  Postoperative changes are again seen.  Bilateral pleural effusions left greater than right are again noted and given the change in technique likely stable. No pneumothorax is noted.   IMPRESSION: Stable bilateral pleural effusions, left greater than right  Original Report Authenticated By: Alcide Clever, M.D.    Dg Chest Port 1 View  12/29/2011  *RADIOLOGY REPORT*  Clinical Data: Follow-up right thoracentesis.  PORTABLE CHEST - 1 VIEW  Comparison: Radiographs and CT 12/28/2011.  Findings: 1151 hours.  The right pleural effusion has significantly decreased in volume.  There is a persistent moderate-sized left pleural effusion.  No pneumothorax is seen.  There is improved aeration of the right lung base. The heart size and mediastinal contours are stable status post CABG.  IMPRESSION: Significantly improved right pleural effusion following thoracentesis.  No evidence of pneumothorax.   Original Report Authenticated By: Carey Bullocks, M.D.     Scheduled Meds:    . aspirin  325 mg Oral Daily  . atorvastatin  20 mg Oral QPC supper  . carvedilol  25 mg Oral BID WC  . docusate sodium  100 mg Oral BID  . enoxaparin (LOVENOX) injection  40 mg Subcutaneous Q24H  . feeding supplement  237 mL Oral BID BM  . levofloxacin (LEVAQUIN) IV  750 mg Intravenous Q24H  . oxybutynin  10 mg Oral Daily  . pantoprazole  40 mg Oral Daily  . [COMPLETED] polyethylene glycol  17 g Oral Once  . polyvinyl alcohol  1 drop Both Eyes Daily  . sodium chloride  3 mL Intravenous Q12H  . sodium chloride  3 mL Intravenous Q12H  . tamoxifen  20 mg Oral Daily  . trimethoprim  100 mg Oral Daily   Continuous Infusions:   Principal Problem:  *Pleural effusion Active Problems:  Breast cancer  Pure hypercholesterolemia  Coronary artery disease  Tachycardia  Dyspnea  HTN (hypertension)    Time spent: > 35 minutes    Penny Pia  [facility] Pager [phone]. If 8PM-8AM, please contact night-coverage at [email or web address], password [facility] 12/30/2011, 5:07 PM  LOS: 2 days

## 2011-12-31 ENCOUNTER — Inpatient Hospital Stay (HOSPITAL_COMMUNITY): Payer: BC Managed Care – PPO

## 2011-12-31 DIAGNOSIS — R229 Localized swelling, mass and lump, unspecified: Secondary | ICD-10-CM

## 2011-12-31 DIAGNOSIS — R0602 Shortness of breath: Secondary | ICD-10-CM

## 2011-12-31 LAB — BODY FLUID CULTURE: Culture: NO GROWTH

## 2011-12-31 LAB — QUANTIFERON TB GOLD ASSAY (BLOOD)

## 2011-12-31 MED ORDER — ZOLPIDEM TARTRATE 5 MG PO TABS
5.0000 mg | ORAL_TABLET | Freq: Every evening | ORAL | Status: DC | PRN
  Administered 2011-12-31 – 2012-01-05 (×5): 5 mg via ORAL
  Filled 2011-12-31 (×5): qty 1

## 2011-12-31 MED ORDER — IOHEXOL 300 MG/ML  SOLN
20.0000 mL | INTRAMUSCULAR | Status: AC
  Administered 2011-12-31: 20 mL via ORAL

## 2011-12-31 MED ORDER — IOHEXOL 300 MG/ML  SOLN
100.0000 mL | Freq: Once | INTRAMUSCULAR | Status: AC | PRN
  Administered 2011-12-31: 100 mL via INTRAVENOUS

## 2011-12-31 NOTE — Progress Notes (Signed)
ANTIBIOTIC CONSULT NOTE - FOLLOW UP  Pharmacy Consult for Levaquin Indication: pneumonia coverage  Allergies  Allergen Reactions  . Codeine Nausea And Vomiting       . Isosorbide Other (See Comments)    Extreme headaches  . Other Swelling    All Lip moisturizers except Vaseline.  . Phenothiazines Other (See Comments)    Makes her stop breathing.  . Talwin (Pentazocine) Nausea And Vomiting  . [place] Venom Anaphylaxis    Patient Measurements: Height: 5\' 6"  (167.6 cm) Weight: 139 lb 11.2 oz (63.368 kg) IBW/kg (Calculated) : 59.3   Vital Signs: Temp: 98.3 F (36.8 C) (11/15 0500) BP: 125/78 mmHg (11/15 0814) Pulse Rate: 96  (11/15 0814) Intake/Output from previous day: 11/14 0701 - 11/15 0700 In: 480 [P.O.:480] Out: -  Intake/Output from this shift: Total I/O In: 243 [P.O.:240; I.V.:3] Out: -   Labs:  Basename 12/29/11 0218 12/28/11 1553  WBC 7.6 9.2  HGB 9.8* 11.3*  PLT 462* 583*  LABCREA -- --  CREATININE 0.71 0.68   Estimated Creatinine Clearance: 65.6 ml/min (by C-G formula based on Cr of 0.71).  Microbiology: Recent Results (from the past 720 hour(s))  AFB CULTURE WITH SMEAR     Status: Normal (Preliminary result)   Collection Time   12/28/11  3:30 PM      Component Value Range Status Comment   Specimen Description PLEURAL FLUID LEFT   Final    Special Requests   Final    ACID FAST SMEAR NO ACID FAST BACILLI SEEN   Final    Culture     Final    Value: CULTURE WILL BE EXAMINED FOR 6 WEEKS BEFORE ISSUING A FINAL REPORT   Report Status PENDING   Incomplete   BODY FLUID CULTURE     Status: Normal   Collection Time   12/28/11  3:30 PM      Component Value Range Status Comment   Specimen Description PLEURAL FLUID LEFT   Final    Special Requests   Final    Gram Stain     Final    Value: RARE WBC PRESENT, PREDOMINANTLY PMN     NO ORGANISMS SEEN   Culture NO GROWTH 3 DAYS   Final    Report Status 12/31/2011 FINAL   Final   FUNGUS CULTURE  W SMEAR     Status: Normal (Preliminary result)   Collection Time   12/28/11  3:30 PM      Component Value Range Status Comment   Specimen Description PLEURAL FLUID LEFT   Final    Special Requests   Final    Fungal Smear NO YEAST OR FUNGAL ELEMENTS SEEN   Final    Culture CULTURE IN PROGRESS FOR FOUR WEEKS   Final    Report Status PENDING   Incomplete   BODY FLUID CULTURE     Status: Normal (Preliminary result)   Collection Time   12/29/11 11:40 AM      Component Value Range Status Comment   Specimen Description PLEURAL FLUID RIGHT   Final    Special Requests   Final    Gram Stain     Final    Value: NO WBC SEEN     NO ORGANISMS SEEN   Culture NO GROWTH 1 DAY   Final    Report Status PENDING   Incomplete   AFB CULTURE WITH SMEAR     Status: Normal (Preliminary result)   Collection Time  12/29/11 11:40 AM      Component Value Range Status Comment   Specimen Description PLEURAL FLUID RIGHT   Final    Special Requests   Final    ACID FAST SMEAR NO ACID FAST BACILLI SEEN   Final    Culture     Final    Value: CULTURE WILL BE EXAMINED FOR 6 WEEKS BEFORE ISSUING A FINAL REPORT   Report Status PENDING   Incomplete   FUNGUS CULTURE W SMEAR     Status: Normal (Preliminary result)   Collection Time   12/29/11 11:40 AM      Component Value Range Status Comment   Specimen Description PLEURAL FLUID RIGHT   Final    Special Requests   Final    Fungal Smear NO YEAST OR FUNGAL ELEMENTS SEEN   Final    Culture CULTURE IN PROGRESS FOR FOUR WEEKS   Final    Report Status PENDING   Incomplete     Anti-infectives     Start     Dose/Rate Route Frequency Ordered Stop   12/29/11 1000   trimethoprim (TRIMPEX) tablet 100 mg        100 mg Oral Daily 12/28/11 1348     12/28/11 1500   levofloxacin (LEVAQUIN) IVPB 750 mg        750 mg 100 mL/hr over 90 Minutes Intravenous Every 24 hours 12/28/11 1415            Assessment:   Day # 4 Levaquin 750 mg IV q24hrs.     S/p  thoracentesis x 2. Cultures negative to date. Cytology pending.  Afebrile, last WBC 7.6 on 11/13.  Goal of Therapy:    Appropriate Levaquin dose for renal function and infection  Plan:   Continue Levaquin 750 mg IV q24hrs.  Will follow up final cultures and length of therapy.  Could consider changing to PO if Levaquin to continue.   CBC and bmet in am.  Dennie Fetters, [place] Pager: [phone] 12/31/2011,2:04 PM

## 2011-12-31 NOTE — Evaluation (Signed)
Physical Therapy Evaluation Patient Details Name: [name] [name] MRN: [id number] DOB: August 09, 1945 Today's Date: 12/31/2011 Time: 4098-1191 PT Time Calculation (min): 15 min  PT Assessment / Plan / Recommendation Clinical Impression  Pt adm with bil pleural effusions.  Pt underwent thoracentesis.  Pt slightly unsteady and weak from limited activity over past several days.  Should progress quickly.  Pt with supportive husband who can assist her at home as needed.  Pt aware of need for assistance currently due to unsteadiness.  Will follow on acute but no PT after DC.    PT Assessment  Patient needs continued PT services    Follow Up Recommendations  No PT follow up    Does the patient have the potential to tolerate intense rehabilitation      Barriers to Discharge        Equipment Recommendations  None recommended by PT    Recommendations for Other Services     Frequency Min 3X/week    Precautions / Restrictions     Pertinent Vitals/Pain SaO2 94% on RA after amb.      Mobility  Bed Mobility Bed Mobility: Supine to Sit;Sitting - Scoot to Edge of Bed;Sit to Supine Supine to Sit: 6: Modified independent (Device/Increase time);HOB elevated Sitting - Scoot to Edge of Bed: 7: Independent Sit to Supine: 6: Modified independent (Device/Increase time);HOB elevated Transfers Transfers: Sit to Stand;Stand to Sit Sit to Stand: 6: Modified independent (Device/Increase time);With upper extremity assist;From bed Stand to Sit: 6: Modified independent (Device/Increase time);With upper extremity assist;To bed Ambulation/Gait Ambulation/Gait Assistance: 4: Min guard Ambulation Distance (Feet): 275 Feet Assistive device: None Ambulation/Gait Assistance Details: Slightly unsteady. Gait Pattern: Step-through pattern;Decreased stride length;Narrow base of support Gait velocity: decr    Shoulder Instructions     Exercises     PT Diagnosis: Difficulty walking  PT Problem List:  Decreased activity tolerance;Decreased balance;Decreased mobility PT Treatment Interventions: DME instruction;Gait training;Stair training;Functional mobility training;Patient/family education;Therapeutic exercise;Balance training;Therapeutic activities   PT Goals Acute Rehab PT Goals PT Goal Formulation: With patient Time For Goal Achievement: 01/07/12 Potential to Achieve Goals: Good Pt will Ambulate: >150 feet;with modified independence;with least restrictive assistive device PT Goal: Ambulate - Progress: Goal set today Pt will Go Up / Down Stairs: 3-5 stairs;with min assist;with least restrictive assistive device PT Goal: Up/Down Stairs - Progress: Goal set today  Visit Information  Last PT Received On: 12/31/11 Assistance Needed: +1    Subjective Data  Subjective: Pt states she has a new grandchild. Patient Stated Goal: Return home and see new grandchild.   Prior Functioning  Home Living Lives With: Spouse Available Help at Discharge: Family;Available 24 hours/day Type of Home: House Home Access: Stairs to enter [employer] of Steps: 3 Entrance Stairs-Rails: None Home Layout: One level Home Adaptive Equipment: Walker - rolling Prior Function Level of Independence: Independent Able to Take Stairs?: Yes Driving: Yes Vocation: Full time employment Communication Communication: No difficulties    Cognition  Overall Cognitive Status: Appears within functional limits for tasks assessed/performed Arousal/Alertness: Awake/alert Orientation Level: Appears intact for tasks assessed Behavior During Session: [facility] for tasks performed    Extremity/Trunk Assessment Right Lower Extremity Assessment RLE ROM/Strength/Tone: [facility] for tasks assessed Left Lower Extremity Assessment LLE ROM/Strength/Tone: [facility] for tasks assessed   Balance Static Standing Balance Static Standing - Balance Support: No upper extremity supported Static Standing - Level of Assistance: 5: Stand by  assistance  End of Session PT - End of Session Activity Tolerance: Patient tolerated treatment well Patient  left: in bed;with call bell/phone within reach Nurse Communication: Mobility status  GP     [facility] 12/31/2011, 2:12 PM  [facility] PT [phone]

## 2011-12-31 NOTE — Progress Notes (Signed)
Reviewed results of thoracentesis with pt.  Discussed that she has metastatic cancer, mostly likely from breast cancer or GYN cancer.  CT abd/pelvis has been ordered by primary team, and Dr. Welton Flakes with oncology has been consulted.   If she develops progression of dyspnea from pleural effusions, then recommend that Dr. Laneta Simmers with [facility] be consulted to assess for pleur-x catheter placement vs pleurodesis.  PCCM will sign off.  Please call if additional help needed.  Coralyn Helling, MD [facility] Pulmonary/Critical Care 12/31/2011, 6:07 PM Pager:  [phone] After 3pm call: [phone]

## 2011-12-31 NOTE — Progress Notes (Signed)
[facility] PROGRESS NOTE  [name] [name] WUJ:[id number] DOB: 12-19-1945 DOA: 12/28/2011 PCP: Lavonda Jumbo, MD  Assessment/Plan: 1. Pleural effusion :  - Pulmonology on board and helping make further recommendations.  Cytology report by pathologist suggests that this is malignancy either from breast metastasis vs adnexal gynecological.  Was informed about this this afternoon and subsequently placed consult to patient's oncologist Dr. Welton Flakes who will see patient and make further recommendations.  Very much want to thank Dr. Welton Flakes for her input in this case. - Disposition will depending on recommended work up and therapy by oncology/pulmonology - Order CT of abdomen and pelvise  2. Tachycardia: Resolved, CT angiogram negative.  Yesterday's ekg was Sinus tachycardia 3. Dyspnea: resolved after thoracentesis and was 2ary to pleural effusions (BL) 4. CAD s/p CABG: Continue aspirin, atorvastatin 5. H/o breast cancer: continue tamoxifen.  Reportedly per patient she had lumpectomy and radiation therapy 3 years ago. Pt reports that she has had mammograms yearly 6. Htn: continue current regimen will continue to monitor.  Code Status: full Family Communication: None at bedside Disposition Plan: Depending on further recommendations from specialist.   Consultants:  Pulmonary : Dr. Craige Cotta  Procedures:  Thoracentesis 12/28/11  Antibiotics:  levaquin  trimethoprim  HPI/Subjective: Pt is upset that new effusions secondary to malignancy.  Is requesting for medication to help her sleep tonight.  Reports that she is still breathing comfortably.  Had some coughing spell that ceased without any intervention.  Denies any hemoptysis.  Objective: Filed Vitals:   12/31/11 0814 12/31/11 1347 12/31/11 1400 12/31/11 1700  BP: 125/78  103/70 103/68  Pulse: 96 95 83 91  Temp:   98.4 F (36.9 C)   TempSrc:   Oral   Resp:   18   Height:      Weight:      SpO2:  94% 93%     Intake/Output  Summary (Last 24 hours) at 12/31/11 1734 Last data filed at 12/31/11 0936  Gross per 24 hour  Intake    243 ml  Output      0 ml  Net    243 ml   Filed Weights   12/28/11 0837 12/29/11 0617 12/30/11 0545  Weight: 64.229 kg (141 lb 9.6 oz) 64.184 kg (141 lb 8 oz) 63.368 kg (139 lb 11.2 oz)    Exam:   General:  Pt in NAD, Alert and Oriented x 3  Cardiovascular: RRR, No MRG  Breast: No palpable masses or lymphadenopathy  Respiratory: CTA BL, decreased breath sounds at bases, no wheezes, speaking in full sentences  Abdomen: soft, NT  Skin: Swollen lymph node left inguinal area non painful to palpation.  Data Reviewed: Basic Metabolic Panel:  Lab 12/29/11 9562 12/28/11 1553 12/28/11 0905  NA 131* -- 137  K 3.8 -- 3.9  CL 97 -- 101  CO2 21 -- --  GLUCOSE 116* -- 134*  BUN 13 -- 15  CREATININE 0.71 0.68 1.00  CALCIUM 8.0* -- --  MG -- -- --  PHOS -- -- --   Liver Function Tests:  Lab 12/28/11 1553  AST --  ALT --  ALKPHOS --  BILITOT --  PROT 7.3  ALBUMIN --   No results found for this basename: LIPASE:5,AMYLASE:5 in the last 168 hours No results found for this basename: AMMONIA:5 in the last 168 hours CBC:  Lab 12/29/11 0218 12/28/11 1553 12/28/11 0905 12/28/11 0850  WBC 7.6 9.2 -- 8.7  NEUTROABS -- -- -- 6.9  HGB 9.8* 11.3*  12.6 11.5*  HCT 30.9* 35.9* 37.0 35.5*  MCV 81.5 83.1 -- 82.2  PLT 462* 583* -- 569*   Cardiac Enzymes:  Lab 12/29/11 0218 12/28/11 1938 12/28/11 1531  CKTOTAL -- -- --  CKMB -- -- --  CKMBINDEX -- -- --  TROPONINI <0.30 <0.30 <0.30   BNP (last 3 results)  Basename 12/28/11 1109  PROBNP 828.2*   CBG: No results found for this basename: GLUCAP:5 in the last 168 hours  Recent Results (from the past 240 hour(s))  AFB CULTURE WITH SMEAR     Status: Normal (Preliminary result)   Collection Time   12/28/11  3:30 PM      Component Value Range Status Comment   Specimen Description PLEURAL FLUID LEFT   Final    Special  Requests   Final    ACID FAST SMEAR NO ACID FAST BACILLI SEEN   Final    Culture     Final    Value: CULTURE WILL BE EXAMINED FOR 6 WEEKS BEFORE ISSUING A FINAL REPORT   Report Status PENDING   Incomplete   BODY FLUID CULTURE     Status: Normal   Collection Time   12/28/11  3:30 PM      Component Value Range Status Comment   Specimen Description PLEURAL FLUID LEFT   Final    Special Requests   Final    Gram Stain     Final    Value: RARE WBC PRESENT, PREDOMINANTLY PMN     NO ORGANISMS SEEN   Culture NO GROWTH 3 DAYS   Final    Report Status 12/31/2011 FINAL   Final   FUNGUS CULTURE W SMEAR     Status: Normal (Preliminary result)   Collection Time   12/28/11  3:30 PM      Component Value Range Status Comment   Specimen Description PLEURAL FLUID LEFT   Final    Special Requests   Final    Fungal Smear NO YEAST OR FUNGAL ELEMENTS SEEN   Final    Culture CULTURE IN PROGRESS FOR FOUR WEEKS   Final    Report Status PENDING   Incomplete   BODY FLUID CULTURE     Status: Normal (Preliminary result)   Collection Time   12/29/11 11:40 AM      Component Value Range Status Comment   Specimen Description PLEURAL FLUID RIGHT   Final    Special Requests   Final    Gram Stain     Final    Value: NO WBC SEEN     NO ORGANISMS SEEN   Culture NO GROWTH 1 DAY   Final    Report Status PENDING   Incomplete   AFB CULTURE WITH SMEAR     Status: Normal (Preliminary result)   Collection Time   12/29/11 11:40 AM      Component Value Range Status Comment   Specimen Description PLEURAL FLUID RIGHT   Final    Special Requests   Final    ACID FAST SMEAR NO ACID FAST BACILLI SEEN   Final    Culture     Final    Value: CULTURE WILL BE EXAMINED FOR 6 WEEKS BEFORE ISSUING A FINAL REPORT   Report Status PENDING   Incomplete   FUNGUS CULTURE W SMEAR     Status: Normal (Preliminary result)   Collection Time   12/29/11 11:40 AM      Component Value Range Status Comment  Specimen  Description PLEURAL FLUID RIGHT   Final    Special Requests   Final    Fungal Smear NO YEAST OR FUNGAL ELEMENTS SEEN   Final    Culture CULTURE IN PROGRESS FOR FOUR WEEKS   Final    Report Status PENDING   Incomplete      Studies: Dg Chest 2 View  12/31/2011  *RADIOLOGY REPORT*  Clinical Data: Fever and cough for 1 week  CHEST - 2 VIEW  Comparison: 12/30/2011  Findings: Left greater right bilateral effusions and associated lung base opacity is stable.  The lung base opacities likely atelectasis although infiltrate is possible.  The lungs are otherwise clear with no pulmonary edema.  Changes from CABG surgery are stable.  No mediastinal or hilar masses or adenopathy.  The heart is normal in size.  IMPRESSION: No change from the previous day's study.  Left larger than the right pleural effusions with associated lung base opacity, most likely atelectasis, although infiltrate possible.   Original Report Authenticated By: Amie Portland, M.D.    Dg Chest 2 View  12/30/2011  *RADIOLOGY REPORT*  Clinical Data: Pleural effusions, shortness of breath  CHEST - 2 VIEW  Comparison: 12/29/2011  Findings: The cardiac shadow is stable.  Postoperative changes are again seen.  Bilateral pleural effusions left greater than right are again noted and given the change in technique likely stable. No pneumothorax is noted.  IMPRESSION: Stable bilateral pleural effusions, left greater than right   Original Report Authenticated By: Alcide Clever, M.D.     Scheduled Meds:    . aspirin  325 mg Oral Daily  . atorvastatin  20 mg Oral QPC supper  . carvedilol  25 mg Oral BID WC  . docusate sodium  100 mg Oral BID  . enoxaparin (LOVENOX) injection  40 mg Subcutaneous Q24H  . feeding supplement  237 mL Oral BID BM  . levofloxacin (LEVAQUIN) IV  750 mg Intravenous Q24H  . oxybutynin  10 mg Oral Daily  . pantoprazole  40 mg Oral Daily  . polyvinyl alcohol  1 drop Both Eyes Daily  . sodium chloride  3 mL Intravenous Q12H   . sodium chloride  3 mL Intravenous Q12H  . tamoxifen  20 mg Oral Daily  . trimethoprim  100 mg Oral Daily   Continuous Infusions:   Principal Problem:  *Pleural effusion Active Problems:  Breast cancer  Pure hypercholesterolemia  Coronary artery disease  Tachycardia  Dyspnea  HTN (hypertension)    Time spent: > 35 minutes    Penny Pia  [facility] Pager [phone]. If 8PM-8AM, please contact night-coverage at [email or web address], password [facility] 12/31/2011, 5:34 PM  LOS: 3 days

## 2012-01-01 DIAGNOSIS — R188 Other ascites: Secondary | ICD-10-CM | POA: Diagnosis present

## 2012-01-01 DIAGNOSIS — C786 Secondary malignant neoplasm of retroperitoneum and peritoneum: Secondary | ICD-10-CM | POA: Diagnosis present

## 2012-01-01 LAB — BASIC METABOLIC PANEL
BUN: 13 mg/dL (ref 6–23)
Chloride: 99 mEq/L (ref 96–112)
Creatinine, Ser: 0.69 mg/dL (ref 0.50–1.10)
GFR calc Af Amer: >90 mL/min (ref >90)
GFR calc non Af Amer: 89 mL/min — ABNORMAL LOW (ref >90)
Potassium: 3.8 mEq/L (ref 3.5–5.1)

## 2012-01-01 LAB — CBC
HCT: 29.3 % — ABNORMAL LOW (ref 36.0–46.0)
Hemoglobin: 9.5 g/dL — ABNORMAL LOW (ref 12.0–15.0)
MCHC: 32.4 g/dL (ref 30.0–36.0)
RDW: 14.3 % (ref 11.5–15.5)
WBC: 8.6 10*3/uL (ref 4.0–10.5)

## 2012-01-01 LAB — CANCER ANTIGEN 27.29: CA 27.29: 35 U/mL (ref 0–39)

## 2012-01-01 LAB — CA 125: CA 125: 392.9 U/mL — ABNORMAL HIGH (ref 0.0–30.2)

## 2012-01-01 LAB — CEA: CEA: <0.5 ng/mL (ref 0.0–5.0)

## 2012-01-01 MED ORDER — LEVOFLOXACIN 750 MG PO TABS
750.0000 mg | ORAL_TABLET | Freq: Every day | ORAL | Status: DC
  Administered 2012-01-01 – 2012-01-02 (×2): 750 mg via ORAL
  Filled 2012-01-01 (×2): qty 1

## 2012-01-01 NOTE — Progress Notes (Signed)
[facility] PROGRESS NOTE  [name] [name] ZOX:[id number] DOB: August 24, 1945 DOA: 12/28/2011 PCP: Lavonda Jumbo, MD  Assessment/Plan: 1. Pleural effusion :  - Pulmonology on board and helping make further recommendations.  Cytology report by pathologist suggests that this is malignancy either from breast metastasis vs adnexal gynecological.  Was informed about cytology 12/31/11 and subsequently placed consult to patient's oncologist Dr. Welton Flakes who will see patient and make further recommendations.  Very much want to thank Dr. Welton Flakes for her input in this case. - I have also placed consult to GYN for further recommendations - Will f/u with heme/onc recommendations at this juncture. - Disposition will depending on recommended work up and therapy by oncology/pulmonology - Ct of abdomen and pelvis showing Peritoneal carcinomatosis, inguinal lymphadenopathy, nonspecific anterior subcutaneous nodules, and 5.4 cm right adnexal cyst interpreted as indeterminate. - Given ascites will order paracentesis with cytology - Also place order for port a cath to be inserted by IR as patient may require chemotherapy  2. Tachycardia: Resolved, CT angiogram negative.  Yesterday's ekg was Sinus tachycardia 3. Dyspnea: resolved after thoracentesis and was 2ary to pleural effusions (BL) likely 2ary to malignancy 4. CAD s/p CABG: Continue aspirin, atorvastatin 5. H/o breast cancer: continue tamoxifen.  Reportedly per patient she had lumpectomy and radiation therapy 3 years ago. Pt reports that she has had mammograms yearly 6. Htn: continue current regimen will continue to monitor.  Code Status: full Family Communication: None at bedside Disposition Plan: Depending on further recommendations from specialist.   Consultants:  Pulmonary : Dr. Craige Cotta  Procedures:  Thoracentesis 12/28/11  Antibiotics:  levaquin  trimethoprim  HPI/Subjective: No new complaints at this juncture.  Patient aware of current  abdominal/pelvic CT findings. No acute issues reported overnight.  Objective: Filed Vitals:   12/31/11 2100 01/01/12 0500 01/01/12 0753 01/01/12 1400  BP: 123/77 112/72 115/96 103/81  Pulse: 85 95 96 87  Temp: 98.2 F (36.8 C) 98.4 F (36.9 C)  98.5 F (36.9 C)  TempSrc:    Oral  Resp: 16 16  20   Height:      Weight:      SpO2: 92% 90%  94%    Intake/Output Summary (Last 24 hours) at 01/01/12 1732 Last data filed at 01/01/12 1600  Gross per 24 hour  Intake    483 ml  Output      0 ml  Net    483 ml   Filed Weights   12/28/11 0837 12/29/11 0617 12/30/11 0545  Weight: 64.229 kg (141 lb 9.6 oz) 64.184 kg (141 lb 8 oz) 63.368 kg (139 lb 11.2 oz)    Exam:   General:  Pt in NAD, Alert and Oriented x 3  Cardiovascular: RRR, No MRG  Breast: No palpable masses or lymphadenopathy  Respiratory: CTA BL, decreased breath sounds at bases, no wheezes, speaking in full sentences  Abdomen: soft, NT, nodules flanking umbilicus (suspicious for sister mary joseph nodules)  Skin: Swollen lymph node left inguinal area non painful to palpation.  Data Reviewed: Basic Metabolic Panel:  Lab 01/01/12 8119 12/29/11 0218 12/28/11 1553 12/28/11 0905  NA 133* 131* -- 137  K 3.8 3.8 -- 3.9  CL 99 97 -- 101  CO2 23 21 -- --  GLUCOSE 113* 116* -- 134*  BUN 13 13 -- 15  CREATININE 0.69 0.71 0.68 1.00  CALCIUM 8.3* 8.0* -- --  MG -- -- -- --  PHOS -- -- -- --   Liver Function Tests:  Lab 12/28/11 1553  AST --  ALT --  ALKPHOS --  BILITOT --  PROT 7.3  ALBUMIN --   No results found for this basename: LIPASE:5,AMYLASE:5 in the last 168 hours No results found for this basename: AMMONIA:5 in the last 168 hours CBC:  Lab 01/01/12 0535 12/29/11 0218 12/28/11 1553 12/28/11 0905 12/28/11 0850  WBC 8.6 7.6 9.2 -- 8.7  NEUTROABS -- -- -- -- 6.9  HGB 9.5* 9.8* 11.3* 12.6 11.5*  HCT 29.3* 30.9* 35.9* 37.0 35.5*  MCV 81.4 81.5 83.1 -- 82.2  PLT 475* 462* 583* -- 569*   Cardiac  Enzymes:  Lab 12/29/11 0218 12/28/11 1938 12/28/11 1531  CKTOTAL -- -- --  CKMB -- -- --  CKMBINDEX -- -- --  TROPONINI <0.30 <0.30 <0.30   BNP (last 3 results)  Basename 12/28/11 1109  PROBNP 828.2*   CBG: No results found for this basename: GLUCAP:5 in the last 168 hours  Recent Results (from the past 240 hour(s))  AFB CULTURE WITH SMEAR     Status: Normal (Preliminary result)   Collection Time   12/28/11  3:30 PM      Component Value Range Status Comment   Specimen Description PLEURAL FLUID LEFT   Final    Special Requests   Final    ACID FAST SMEAR NO ACID FAST BACILLI SEEN   Final    Culture     Final    Value: CULTURE WILL BE EXAMINED FOR 6 WEEKS BEFORE ISSUING A FINAL REPORT   Report Status PENDING   Incomplete   BODY FLUID CULTURE     Status: Normal   Collection Time   12/28/11  3:30 PM      Component Value Range Status Comment   Specimen Description PLEURAL FLUID LEFT   Final    Special Requests   Final    Gram Stain     Final    Value: RARE WBC PRESENT, PREDOMINANTLY PMN     NO ORGANISMS SEEN   Culture NO GROWTH 3 DAYS   Final    Report Status 12/31/2011 FINAL   Final   FUNGUS CULTURE W SMEAR     Status: Normal (Preliminary result)   Collection Time   12/28/11  3:30 PM      Component Value Range Status Comment   Specimen Description PLEURAL FLUID LEFT   Final    Special Requests   Final    Fungal Smear NO YEAST OR FUNGAL ELEMENTS SEEN   Final    Culture CULTURE IN PROGRESS FOR FOUR WEEKS   Final    Report Status PENDING   Incomplete   BODY FLUID CULTURE     Status: Normal (Preliminary result)   Collection Time   12/29/11 11:40 AM      Component Value Range Status Comment   Specimen Description PLEURAL FLUID RIGHT   Final    Special Requests   Final    Gram Stain     Final    Value: NO WBC SEEN     NO ORGANISMS SEEN   Culture NO GROWTH 2 DAYS   Final    Report Status PENDING   Incomplete   AFB CULTURE WITH SMEAR     Status: Normal  (Preliminary result)   Collection Time   12/29/11 11:40 AM      Component Value Range Status Comment   Specimen Description PLEURAL FLUID RIGHT   Final    Special Requests   Final  ACID FAST SMEAR NO ACID FAST BACILLI SEEN   Final    Culture     Final    Value: CULTURE WILL BE EXAMINED FOR 6 WEEKS BEFORE ISSUING A FINAL REPORT   Report Status PENDING   Incomplete   FUNGUS CULTURE W SMEAR     Status: Normal (Preliminary result)   Collection Time   12/29/11 11:40 AM      Component Value Range Status Comment   Specimen Description PLEURAL FLUID RIGHT   Final    Special Requests   Final    Fungal Smear NO YEAST OR FUNGAL ELEMENTS SEEN   Final    Culture CULTURE IN PROGRESS FOR FOUR WEEKS   Final    Report Status PENDING   Incomplete      Studies: Dg Chest 2 View  12/31/2011  *RADIOLOGY REPORT*  Clinical Data: Fever and cough for 1 week  CHEST - 2 VIEW  Comparison: 12/30/2011  Findings: Left greater right bilateral effusions and associated lung base opacity is stable.  The lung base opacities likely atelectasis although infiltrate is possible.  The lungs are otherwise clear with no pulmonary edema.  Changes from CABG surgery are stable.  No mediastinal or hilar masses or adenopathy.  The heart is normal in size.  IMPRESSION: No change from the previous day's study.  Left larger than the right pleural effusions with associated lung base opacity, most likely atelectasis, although infiltrate possible.   Original Report Authenticated By: Amie Portland, M.D.    Ct Abdomen Pelvis W Contrast  12/31/2011  *RADIOLOGY REPORT*  Clinical Data: Pleural effusion, possible gynecologic malignancy.  CT ABDOMEN AND PELVIS WITH CONTRAST  Technique:  Multidetector CT imaging of the abdomen and pelvis was performed following the standard protocol during bolus administration of intravenous contrast.  Contrast: OMNIPAQUE IOHEXOL 300 MG/ML  SOLN  Comparison: 12/28/2011 chest CT  Findings: Bilateral  pleural effusions, left greater than right. Associated airspace consolidations.  Heart size within normal limits.  Coronary artery calcification.  Unremarkable liver, spleen, pancreas, biliary system, adrenal glands.  Symmetric renal enhancement.  No hydronephrosis or hydroureter.  No bowel obstruction.  No CT evidence for colitis.  There is a moderate amount of ascites. Peritoneal carcinomatosis, with the largest conglomerate in the left paracolic gutter, measuring approximately 7.7 x 6.2 cm and displacing the small bowel centrally.  No free intraperitoneal air.  No lymphadenopathy.  Thin-walled bladder.  Uterus not identified.  5.4 cm right adnexal cyst.  Enlarged inguinal lymph nodes, measuring up to 13 mm on the left.  There is scattered atherosclerotic calcification of the aorta and its branches. No aneurysmal dilatation.  Multilevel degenerative changes without acute osseous finding. Multiple subcutaneous rounded densities are nonspecific and may reflect sequelae of prior injection sites, however metastases not excluded.  IMPRESSION: Bilateral pleural effusions and associated consolidations are partially imaged.  Peritoneal carcinomatosis and a moderate amount of ascites.  5.4 cm right adnexal cyst is indeterminate.  Inguinal lymphadenopathy.  Nonspecific anterior subcutaneous nodules. Favored to reflect sequelae of prior injection sites, however metastases not excluded.   Original Report Authenticated By: Jearld Lesch, M.D.     Scheduled Meds:    . aspirin  325 mg Oral Daily  . atorvastatin  20 mg Oral QPC supper  . carvedilol  25 mg Oral BID WC  . docusate sodium  100 mg Oral BID  . enoxaparin (LOVENOX) injection  40 mg Subcutaneous Q24H  . feeding supplement  237 mL Oral BID  BM  . [EXPIRED] iohexol  20 mL Oral Q1 Hr x 2  . levofloxacin  750 mg Oral Q1500  . oxybutynin  10 mg Oral Daily  . pantoprazole  40 mg Oral Daily  . polyvinyl alcohol  1 drop Both Eyes Daily  . sodium chloride  3  mL Intravenous Q12H  . sodium chloride  3 mL Intravenous Q12H  . tamoxifen  20 mg Oral Daily  . trimethoprim  100 mg Oral Daily  . [DISCONTINUED] levofloxacin (LEVAQUIN) IV  750 mg Intravenous Q24H   Continuous Infusions:   Principal Problem:  *Pleural effusion Active Problems:  Breast cancer  Pure hypercholesterolemia  Coronary artery disease  Tachycardia  Dyspnea  HTN (hypertension)    Time spent: > 40 minutes    Penny Pia  [facility] Pager [phone]. If 8PM-8AM, please contact night-coverage at [email or web address], password [email or web address] 01/01/2012, 5:32 PM  LOS: 4 days

## 2012-01-02 ENCOUNTER — Inpatient Hospital Stay (HOSPITAL_COMMUNITY): Payer: BC Managed Care – PPO

## 2012-01-02 DIAGNOSIS — C482 Malignant neoplasm of peritoneum, unspecified: Secondary | ICD-10-CM

## 2012-01-02 LAB — BODY FLUID CULTURE: Gram Stain: NONE SEEN

## 2012-01-02 LAB — BODY FLUID CELL COUNT WITH DIFFERENTIAL
Lymphs, Fluid: 83 %
Monocyte-Macrophage-Serous Fluid: 7 % — ABNORMAL LOW (ref 50–90)

## 2012-01-02 NOTE — Progress Notes (Signed)
Received request for port a cath placement for "possible chemotherapy".  Most of these are placed at [facility] on a non-emergent basis. Can be placed at [facility] if/once oncologists ensures is a necessity as IP.  Patient is spiking fevers again today - prefer off antibiotics and afebrile 48 hours before placing port given infection risks.  Cultures pending on pleural fluid but no growth as of yet. Will recheck patient on Monday and oncology opinion once results of ascites and plan made to determine if [facility] is needed. In the interim PICC can be used if IV access is a problem. IR will follow.

## 2012-01-02 NOTE — Procedures (Signed)
Procedure : diagnostic and therapeutic paracentesis Specimen : 1.1L bloody serous fluid Complications : none immediate  Fluid sent to lab as requested. Pt tolerated well

## 2012-01-02 NOTE — Progress Notes (Signed)
[facility] PROGRESS NOTE  [name] [name] ZOX:[id number] DOB: 1946/02/11 DOA: 12/28/2011 PCP: Lavonda Jumbo, MD  Assessment/Plan: 1. Pleural effusion :  - Pulmonology on board initially and have made their recommendations.  Cytology report by pathologist suggests that this is malignancy either from breast metastasis vs adnexal gynecological.  Was informed about cytology 12/31/11 and subsequently placed consult to patient's oncologist Dr. Welton Flakes who is following and making further recommendations.  Very much want to thank Dr. Welton Flakes for her input in this case. - I have also placed consult to GYN for further recommendations and they have indicated that GYN oncologist will follow up with patient likely this upcomming Tuesday 01/04/12 - Will f/u with heme/onc recommendations at this juncture. - Disposition will depending on recommended work up and therapy by oncology/pulmonology/GYN oncologist - Ct of abdomen and pelvis showing Peritoneal carcinomatosis, inguinal lymphadenopathy, nonspecific anterior subcutaneous nodules, and 5.4 cm right adnexal cyst interpreted as indeterminate. - Paracentesis with cytology on 01/02/12 - Transvaginal ultrasound complete 01/02/12 - Also placed order for port a cath to be inserted by IR as patient may require chemotherapy. Based on note from radiology they will await input from Oncologist prior to inserting port a cath. - Antibiotics were initiated during initial evaluation for suspected pneumonia or pleural effusions 2ary to infectious etiology.  This is no longer the case and effusions likely 2ary to malignancy.  Therefore will d/c antibiotics at this juncture.  Patient afebrile, no cough, non toxic, and wbc within normal limits.  2. Tachycardia: Resolved, CT angiogram negative.  Heart rate WNL's and last recorded 85. 3. Dyspnea: resolved after thoracentesis and was 2ary to pleural effusions (BL) likely 2ary to malignancy 4. CAD s/p CABG: Continue aspirin,  atorvastatin 5. H/o breast cancer: Per oncologist. 6. Htn: continue current regimen will continue to monitor.  Code Status: full Family Communication: None at bedside Disposition Plan: Depending on further recommendations from specialist.   Consultants:  Pulmonary : Dr. Craige Cotta  Oncology: Dr. Welton Flakes  GYN  Procedures:  Thoracentesis 12/28/11  Paracentesis 01/02/12  Antibiotics:  levaquin d/c 01/02/12  Trimethoprim 01/02/12  HPI/Subjective: No new complaints at this juncture.  Feels better after paracentesis.    Objective: Filed Vitals:   01/02/12 0500 01/02/12 1009 01/02/12 1045 01/02/12 1400  BP: 110/71 130/75 109/68 99/65  Pulse: 89   85  Temp: 99.6 F (37.6 C)   98.2 F (36.8 C)  TempSrc:      Resp: 16   17  Height:      Weight: 61.9 kg (136 lb 7.4 oz)     SpO2: 95% 93% 92% 93%    Intake/Output Summary (Last 24 hours) at 01/02/12 1547 Last data filed at 01/02/12 1300  Gross per 24 hour  Intake    723 ml  Output      0 ml  Net    723 ml   Filed Weights   12/29/11 0617 12/30/11 0545 01/02/12 0500  Weight: 64.184 kg (141 lb 8 oz) 63.368 kg (139 lb 11.2 oz) 61.9 kg (136 lb 7.4 oz)    Exam:   General:  Pt in NAD, Alert and Oriented x 3  Cardiovascular: RRR, No MRG  Breast: No palpable masses or lymphadenopathy  Respiratory: CTA BL, decreased breath sounds at bases, no wheezes, speaking in full sentences  Abdomen: soft, NT, nodules flanking umbilicus (suspicious for sister mary joseph nodules)  Skin: Swollen lymph node left inguinal area and surrounding umbilicus non painful to palpation.  Data Reviewed: Basic Metabolic  Panel:  Lab 01/01/12 0535 12/29/11 0218 12/28/11 1553 12/28/11 0905  NA 133* 131* -- 137  K 3.8 3.8 -- 3.9  CL 99 97 -- 101  CO2 23 21 -- --  GLUCOSE 113* 116* -- 134*  BUN 13 13 -- 15  CREATININE 0.69 0.71 0.68 1.00  CALCIUM 8.3* 8.0* -- --  MG -- -- -- --  PHOS -- -- -- --   Liver Function Tests:  Lab 12/28/11 1553    AST --  ALT --  ALKPHOS --  BILITOT --  PROT 7.3  ALBUMIN --   No results found for this basename: LIPASE:5,AMYLASE:5 in the last 168 hours No results found for this basename: AMMONIA:5 in the last 168 hours CBC:  Lab 01/01/12 0535 12/29/11 0218 12/28/11 1553 12/28/11 0905 12/28/11 0850  WBC 8.6 7.6 9.2 -- 8.7  NEUTROABS -- -- -- -- 6.9  HGB 9.5* 9.8* 11.3* 12.6 11.5*  HCT 29.3* 30.9* 35.9* 37.0 35.5*  MCV 81.4 81.5 83.1 -- 82.2  PLT 475* 462* 583* -- 569*   Cardiac Enzymes:  Lab 12/29/11 0218 12/28/11 1938 12/28/11 1531  CKTOTAL -- -- --  CKMB -- -- --  CKMBINDEX -- -- --  TROPONINI <0.30 <0.30 <0.30   BNP (last 3 results)  Basename 12/28/11 1109  PROBNP 828.2*   CBG: No results found for this basename: GLUCAP:5 in the last 168 hours  Recent Results (from the past 240 hour(s))  AFB CULTURE WITH SMEAR     Status: Normal (Preliminary result)   Collection Time   12/28/11  3:30 PM      Component Value Range Status Comment   Specimen Description PLEURAL FLUID LEFT   Final    Special Requests   Final    ACID FAST SMEAR NO ACID FAST BACILLI SEEN   Final    Culture     Final    Value: CULTURE WILL BE EXAMINED FOR 6 WEEKS BEFORE ISSUING A FINAL REPORT   Report Status PENDING   Incomplete   BODY FLUID CULTURE     Status: Normal   Collection Time   12/28/11  3:30 PM      Component Value Range Status Comment   Specimen Description PLEURAL FLUID LEFT   Final    Special Requests   Final    Gram Stain     Final    Value: RARE WBC PRESENT, PREDOMINANTLY PMN     NO ORGANISMS SEEN   Culture NO GROWTH 3 DAYS   Final    Report Status 12/31/2011 FINAL   Final   FUNGUS CULTURE W SMEAR     Status: Normal (Preliminary result)   Collection Time   12/28/11  3:30 PM      Component Value Range Status Comment   Specimen Description PLEURAL FLUID LEFT   Final    Special Requests   Final    Fungal Smear NO YEAST OR FUNGAL ELEMENTS SEEN   Final    Culture CULTURE IN  PROGRESS FOR FOUR WEEKS   Final    Report Status PENDING   Incomplete   BODY FLUID CULTURE     Status: Normal   Collection Time   12/29/11 11:40 AM      Component Value Range Status Comment   Specimen Description PLEURAL FLUID RIGHT   Final    Special Requests   Final    Gram Stain     Final    Value: NO WBC SEEN  NO ORGANISMS SEEN   Culture NO GROWTH 3 DAYS   Final    Report Status 01/02/2012 FINAL   Final   AFB CULTURE WITH SMEAR     Status: Normal (Preliminary result)   Collection Time   12/29/11 11:40 AM      Component Value Range Status Comment   Specimen Description PLEURAL FLUID RIGHT   Final    Special Requests   Final    ACID FAST SMEAR NO ACID FAST BACILLI SEEN   Final    Culture     Final    Value: CULTURE WILL BE EXAMINED FOR 6 WEEKS BEFORE ISSUING A FINAL REPORT   Report Status PENDING   Incomplete   FUNGUS CULTURE W SMEAR     Status: Normal (Preliminary result)   Collection Time   12/29/11 11:40 AM      Component Value Range Status Comment   Specimen Description PLEURAL FLUID RIGHT   Final    Special Requests   Final    Fungal Smear NO YEAST OR FUNGAL ELEMENTS SEEN   Final    Culture CULTURE IN PROGRESS FOR FOUR WEEKS   Final    Report Status PENDING   Incomplete      Studies: Dg Chest 2 View  12/31/2011  *RADIOLOGY REPORT*  Clinical Data: Fever and cough for 1 week  CHEST - 2 VIEW  Comparison: 12/30/2011  Findings: Left greater right bilateral effusions and associated lung base opacity is stable.  The lung base opacities likely atelectasis although infiltrate is possible.  The lungs are otherwise clear with no pulmonary edema.  Changes from CABG surgery are stable.  No mediastinal or hilar masses or adenopathy.  The heart is normal in size.  IMPRESSION: No change from the previous day's study.  Left larger than the right pleural effusions with associated lung base opacity, most likely atelectasis, although infiltrate possible.   Original Report  Authenticated By: Amie Portland, M.D.    US Transvaginal Non-ob  01/02/2012  *RADIOLOGY REPORT*  Clinical Data: Adnexal mass, peritoneal carcinomatosis  TRANSABDOMINAL AND TRANSVAGINAL ULTRASOUND OF PELVIS Technique:  Both transabdominal and transvaginal ultrasound examinations of the pelvis were performed. Transabdominal technique was performed for global imaging of the pelvis including uterus, ovaries, adnexal regions, and pelvic cul-de-sac.  It was necessary to proceed with endovaginal exam following the transabdominal exam to visualize the right ovary.  Comparison:  None  Findings:  Uterus: Surgically absent.  Right ovary:  Complex cystic right adnexal mass measuring 3.1 x 5.7 x 4.5 cm with surrounding rim of suspected ovarian tissue.  Left ovary: Not visualized transabdominally/transvaginally. Correlate with surgical history.  Other findings: Moderate pelvic ascites with internal echoes.  IMPRESSION: 5.7 cm complex cystic right ovarian lesion.  Given the associated findings on CT, this appearance is worrisome for primary ovarian neoplasm.  Status post hysterectomy.  Left ovary is not discretely visualized.  Correlate with surgical history.   Original Report Authenticated By: Charline Bills, M.D.    US Pelvis Complete  01/02/2012  *RADIOLOGY REPORT*  Clinical Data: Adnexal mass, peritoneal carcinomatosis  TRANSABDOMINAL AND TRANSVAGINAL ULTRASOUND OF PELVIS Technique:  Both transabdominal and transvaginal ultrasound examinations of the pelvis were performed. Transabdominal technique was performed for global imaging of the pelvis including uterus, ovaries, adnexal regions, and pelvic cul-de-sac.  It was necessary to proceed with endovaginal exam following the transabdominal exam to visualize the right ovary.  Comparison:  None  Findings:  Uterus: Surgically absent.  Right ovary:  Complex cystic right adnexal mass measuring 3.1 x 5.7 x 4.5 cm with surrounding rim of suspected ovarian tissue.  Left ovary:  Not visualized transabdominally/transvaginally. Correlate with surgical history.  Other findings: Moderate pelvic ascites with internal echoes.  IMPRESSION: 5.7 cm complex cystic right ovarian lesion.  Given the associated findings on CT, this appearance is worrisome for primary ovarian neoplasm.  Status post hysterectomy.  Left ovary is not discretely visualized.  Correlate with surgical history.   Original Report Authenticated By: Charline Bills, M.D.    Ct Abdomen Pelvis W Contrast  12/31/2011  *RADIOLOGY REPORT*  Clinical Data: Pleural effusion, possible gynecologic malignancy.  CT ABDOMEN AND PELVIS WITH CONTRAST  Technique:  Multidetector CT imaging of the abdomen and pelvis was performed following the standard protocol during bolus administration of intravenous contrast.  Contrast: OMNIPAQUE IOHEXOL 300 MG/ML  SOLN  Comparison: 12/28/2011 chest CT  Findings: Bilateral pleural effusions, left greater than right. Associated airspace consolidations.  Heart size within normal limits.  Coronary artery calcification.  Unremarkable liver, spleen, pancreas, biliary system, adrenal glands.  Symmetric renal enhancement.  No hydronephrosis or hydroureter.  No bowel obstruction.  No CT evidence for colitis.  There is a moderate amount of ascites. Peritoneal carcinomatosis, with the largest conglomerate in the left paracolic gutter, measuring approximately 7.7 x 6.2 cm and displacing the small bowel centrally.  No free intraperitoneal air.  No lymphadenopathy.  Thin-walled bladder.  Uterus not identified.  5.4 cm right adnexal cyst.  Enlarged inguinal lymph nodes, measuring up to 13 mm on the left.  There is scattered atherosclerotic calcification of the aorta and its branches. No aneurysmal dilatation.  Multilevel degenerative changes without acute osseous finding. Multiple subcutaneous rounded densities are nonspecific and may reflect sequelae of prior injection sites, however metastases not excluded.   IMPRESSION: Bilateral pleural effusions and associated consolidations are partially imaged.  Peritoneal carcinomatosis and a moderate amount of ascites.  5.4 cm right adnexal cyst is indeterminate.  Inguinal lymphadenopathy.  Nonspecific anterior subcutaneous nodules. Favored to reflect sequelae of prior injection sites, however metastases not excluded.   Original Report Authenticated By: Jearld Lesch, M.D.    US Paracentesis  01/02/2012  *RADIOLOGY REPORT*  Clinical Data: New onset ascites and pleural effusions.  History of breast cancer.  Now with abdominal abnormalities concerning for gynecological malignancy.  Request has been made for therapeutic and diagnostic paracentesis.  ULTRASOUND GUIDED PARACENTESIS  Comparison:  CT imaging of the abdomen and pelvis  An ultrasound guided paracentesis was thoroughly discussed with the patient and questions answered.  The benefits, risks, alternatives and complications were also discussed.  The patient understands and wishes to proceed with the procedure.  Written consent was obtained.  Ultrasound was performed to localize and mark an adequate pocket of fluid in the right lower quadrant of the abdomen.  The area was then prepped and draped in the normal sterile fashion.  1% Lidocaine was used for local anesthesia.  Under ultrasound guidance a 19 gauge Yueh catheter was introduced.  Paracentesis was performed.  The catheter was removed and a dressing applied.  Complications:  None immediate  Findings:  A total of approximately 1.1 liters of bloody serous fluid was removed.  A fluid sample was sent for laboratory analysis.  IMPRESSION: Successful ultrasound guided paracentesis yielding 1.1 liters of ascites.  Read by: Anselm Pancoast, P.A.-C   Original Report Authenticated By: Richarda Overlie, M.D.     Scheduled Meds:    . aspirin  325  mg Oral Daily  . atorvastatin  20 mg Oral QPC supper  . carvedilol  25 mg Oral BID WC  . docusate sodium  100 mg Oral BID  .  enoxaparin (LOVENOX) injection  40 mg Subcutaneous Q24H  . feeding supplement  237 mL Oral BID BM  . levofloxacin  750 mg Oral Q1500  . oxybutynin  10 mg Oral Daily  . pantoprazole  40 mg Oral Daily  . polyvinyl alcohol  1 drop Both Eyes Daily  . sodium chloride  3 mL Intravenous Q12H  . sodium chloride  3 mL Intravenous Q12H  . tamoxifen  20 mg Oral Daily  . trimethoprim  100 mg Oral Daily   Continuous Infusions:   Principal Problem:  *Pleural effusion Active Problems:  Breast cancer  Pure hypercholesterolemia  Coronary artery disease  Tachycardia  Dyspnea  HTN (hypertension)  Ascites  Peritoneal carcinomatosis    Time spent: > 40 minutes    Penny Pia  [facility] Pager [phone]. If 8PM-8AM, please contact night-coverage at [email or web address], password [email or web address] 01/02/2012, 3:47 PM  LOS: 5 days

## 2012-01-03 ENCOUNTER — Inpatient Hospital Stay (HOSPITAL_COMMUNITY): Payer: BC Managed Care – PPO

## 2012-01-03 DIAGNOSIS — R55 Syncope and collapse: Secondary | ICD-10-CM

## 2012-01-03 NOTE — Progress Notes (Signed)
Physical Therapy Treatment Patient Details Name: [name] [name] MRN: [id number] DOB: Sep 29, 1945 Today's Date: 01/03/2012 Time: 6295-2841 PT Time Calculation (min): 23 min  PT Assessment / Plan / Recommendation Comments on Treatment Session  Patient had episode of dizziness and fainting x 2-3 seconds.  Patient required assist to get back to the bed.  RN notified of event.  After return to bed, O2 sat remained 91-92%.    Follow Up Recommendations  No PT follow up     Does the patient have the potential to tolerate intense rehabilitation     Barriers to Discharge        Equipment Recommendations  None recommended by PT    Recommendations for Other Services    Frequency Min 3X/week   Plan Discharge plan remains appropriate;Frequency remains appropriate    Precautions / Restrictions Precautions Precautions: None Restrictions Weight Bearing Restrictions: No   Pertinent Vitals/Pain After dizziness/fainting episode, BP 112/71; HR 77; O2 sat 86% increasing to 92% within 60 seconds.     Mobility  Bed Mobility Bed Mobility: Supine to Sit;Sit to Supine Supine to Sit: 6: Modified independent (Device/Increase time);With rails Sit to Supine: 6: Modified independent (Device/Increase time);With rail;HOB elevated Details for Bed Mobility Assistance: No cues or assist needed Transfers Transfers: Sit to Stand;Stand to Sit Sit to Stand: 5: Supervision;With upper extremity assist;From bed Stand to Sit: 3: Mod assist;To bed Details for Transfer Assistance: Supervision for safety when standing.  Patient required mod assist to get back to bed and sit on EOB due to dizziness/fainting. Ambulation/Gait Ambulation/Gait Assistance: 4: Min guard;3: Mod assist Ambulation Distance (Feet): 16 Feet Assistive device: None Ambulation/Gait Assistance Details: Initially patient ambulated to sink with min guard assist to comb hair prior to ambulation in hallway.  Patient stated "I feel dizzy".   Patient was pale, LE's buckling.  Required mod assist to get back to bed.  Patient was unresponsive for 2-3 seconds.  Once on bed, able to speak to therapist.  Returned patient safely to bed and took vital signs.  Reported to RN.  BP 112/71; HR 77.  However O2 sat at 86% - after several minutes of lying on bed.  Did return to 92% after 1 minute.  Patient then stood to change bed pad, and sat EOB to change gown per her request.  O2 sat remained at 91-92%.   Gait Pattern: Step-through pattern;Decreased stride length;Narrow base of support Gait velocity: decr     PT Goals Acute Rehab PT Goals PT Goal: Ambulate - Progress: Not progressing  Visit Information  Last PT Received On: 01/03/12 Assistance Needed: +1    Subjective Data  Subjective: "I feel dizzy" Patient stated as she was combing her hair at the sink.     Cognition  Overall Cognitive Status: Appears within functional limits for tasks assessed/performed Arousal/Alertness: Awake/alert Orientation Level: Appears intact for tasks assessed Behavior During Session: [facility] for tasks performed    Balance     End of Session PT - End of Session Equipment Utilized During Treatment: Gait belt Activity Tolerance: Patient limited by fatigue;Treatment limited secondary to medical complications (Comment) (Patient with episode of dizziness.  Fainted x 2-3 seconds.  ) Patient left: in bed;with call bell/phone within reach Nurse Communication: Mobility status (Fainting episode and decrease in O2 sats.)   GP     Vena Austria 01/03/2012, 2:17 PM Durenda Hurt. Renaldo Fiddler, [facility] Acute Rehab Services Pager [phone]

## 2012-01-03 NOTE — Progress Notes (Signed)
Dr Cleda Mccreedy, Gyn Oncologist will be able to see pt @ [facility] 01/05/12 around 3pm.  As we do not have fulltime service this is the earliest availability.  Should you need to speak with her before then the # @ [facility] is [phone]

## 2012-01-03 NOTE — Progress Notes (Signed)
Notified by PT that patient passed out during therapy.  VSS, however oxygen saturation was in the 80's on room air.  Dr. Cena Benton notified and will continue to monitor. Nolon Nations

## 2012-01-03 NOTE — Progress Notes (Signed)
Patient very tearful and expressing feelings of hopelessness and doom.  Pt says she feels general malaise;  states, "I just know I am not going to get out of this place to hold my grand baby."  RN sat with patient at bedside until husband arrived.  MD notified of patient's condition.  Will continue to monitor. Nolon Nations

## 2012-01-03 NOTE — Progress Notes (Signed)
[facility] PROGRESS NOTE  [name] [name] WUJ:[id number] DOB: 06/19/1945 DOA: 12/28/2011 PCP: Lavonda Jumbo, MD  Assessment/Plan: 1. Pleural effusion :  - Pulmonology on board initially and have made their recommendations.  Cytology report by pathologist suggests that this is malignancy either from breast metastasis vs adnexal gynecological.  Was informed about cytology 12/31/11 and subsequently placed consult to patient's oncologist Dr. Welton Flakes who is following and making further recommendations.  Very much want to thank Dr. Welton Flakes for her input in this case. - I have also placed consult to GYN for further recommendations and they have indicated that GYN oncologist will follow up with patient likely this upcomming Wednesday 01/05/12 - Will f/u with heme/onc recommendations at this juncture. - Disposition will depending on recommended work up and therapy by oncology/pulmonology/GYN oncologist - Ct of abdomen and pelvis showing Peritoneal carcinomatosis, inguinal lymphadenopathy, nonspecific anterior subcutaneous nodules, and 5.4 cm right adnexal cyst interpreted as indeterminate. - Paracentesis with cytology on 01/02/12 - Transvaginal ultrasound complete 01/02/12 - Also placed order for port a cath to be inserted by IR as patient may require chemotherapy. Based on note from radiology they will await input from Oncologist prior to inserting port a cath. - Antibiotics were initiated during initial evaluation for suspected pneumonia or pleural effusions 2ary to infectious etiology.  This is no longer the case and effusions likely 2ary to malignancy.  Therefore will d/c antibiotics at this juncture.  Patient afebrile, no cough, non toxic, and wbc within normal limits.  2. Tachycardia: Resolved, CT angiogram negative.  Heart rate WNL's and last recorded 85. 3. Dyspnea: resolved after thoracentesis and was 2ary to pleural effusions (BL) likely 2ary to malignancy 4. CAD s/p CABG: Continue aspirin,  atorvastatin 5. H/o breast cancer: Per oncologist. 6. Htn: continue current regimen will continue to monitor. 7. Syncopal episode:  - Etiology uncertain. No red flags reported on telemetry monitoring - Suspect orthostatic hypotension - No seizure like activity reported.  Will defer EEG - Patient had reportedly hypoxia when evaluated during syncopal episode. - Plan on consulting patient's Cardiothoracic surgeon next am given recurrent pleural effusion.   Code Status: full Family Communication: None at bedside Disposition Plan: Depending on further recommendations from specialist.   Consultants:  Pulmonary : Dr. Craige Cotta  Oncology: Dr. Welton Flakes  GYN  Procedures:  Thoracentesis 12/28/11  Paracentesis 01/02/12  Antibiotics:  levaquin d/c 01/02/12  Trimethoprim 01/02/12  HPI/Subjective: Patient reports that she passed out today while doing physical therapy.  Reported that after she got up to go walk with physical therapy she passed out.  Denies any tongue bitting, bladder/bowel incontinence, or seizure like activity per faculty reports.  Within several seconds patient was alert again and placed in bed.  She denied any palpitations with episode.  Objective: Filed Vitals:   01/03/12 1358 01/03/12 1401 01/03/12 1526 01/03/12 1711  BP: 99/66 94/61 118/76 111/70  Pulse: 93 92 92 92  Temp:   98.3 F (36.8 C)   TempSrc:   Oral   Resp:   12   Height:      Weight:      SpO2:   93%     Intake/Output Summary (Last 24 hours) at 01/03/12 1853 Last data filed at 01/03/12 1700  Gross per 24 hour  Intake   1003 ml  Output      0 ml  Net   1003 ml   Filed Weights   12/30/11 0545 01/02/12 0500 01/03/12 0500  Weight: 63.368 kg (139 lb 11.2  oz) 61.9 kg (136 lb 7.4 oz) 60.9 kg (134 lb 4.2 oz)    Exam:   General:  Pt in NAD, Alert and Oriented x 3  Cardiovascular: RRR, No MRG  Breast: No palpable masses or lymphadenopathy  Respiratory: CTA BL, decreased breath sounds at bases  worse today, no wheezes, speaking in full sentences  Abdomen: soft, NT, nodules flanking umbilicus (suspicious for sister mary joseph nodules)  Skin: Swollen lymph node left inguinal area and surrounding umbilicus non painful to palpation.  Data Reviewed: Basic Metabolic Panel:  Lab 01/01/12 1191 12/29/11 0218 12/28/11 1553 12/28/11 0905  NA 133* 131* -- 137  K 3.8 3.8 -- 3.9  CL 99 97 -- 101  CO2 23 21 -- --  GLUCOSE 113* 116* -- 134*  BUN 13 13 -- 15  CREATININE 0.69 0.71 0.68 1.00  CALCIUM 8.3* 8.0* -- --  MG -- -- -- --  PHOS -- -- -- --   Liver Function Tests:  Lab 12/28/11 1553  AST --  ALT --  ALKPHOS --  BILITOT --  PROT 7.3  ALBUMIN --   No results found for this basename: LIPASE:5,AMYLASE:5 in the last 168 hours No results found for this basename: AMMONIA:5 in the last 168 hours CBC:  Lab 01/01/12 0535 12/29/11 0218 12/28/11 1553 12/28/11 0905 12/28/11 0850  WBC 8.6 7.6 9.2 -- 8.7  NEUTROABS -- -- -- -- 6.9  HGB 9.5* 9.8* 11.3* 12.6 11.5*  HCT 29.3* 30.9* 35.9* 37.0 35.5*  MCV 81.4 81.5 83.1 -- 82.2  PLT 475* 462* 583* -- 569*   Cardiac Enzymes:  Lab 12/29/11 0218 12/28/11 1938 12/28/11 1531  CKTOTAL -- -- --  CKMB -- -- --  CKMBINDEX -- -- --  TROPONINI <0.30 <0.30 <0.30   BNP (last 3 results)  Basename 12/28/11 1109  PROBNP 828.2*   CBG: No results found for this basename: GLUCAP:5 in the last 168 hours  Recent Results (from the past 240 hour(s))  AFB CULTURE WITH SMEAR     Status: Normal (Preliminary result)   Collection Time   12/28/11  3:30 PM      Component Value Range Status Comment   Specimen Description PLEURAL FLUID LEFT   Final    Special Requests   Final    ACID FAST SMEAR NO ACID FAST BACILLI SEEN   Final    Culture     Final    Value: CULTURE WILL BE EXAMINED FOR 6 WEEKS BEFORE ISSUING A FINAL REPORT   Report Status PENDING   Incomplete   BODY FLUID CULTURE     Status: Normal   Collection Time   12/28/11  3:30 PM       Component Value Range Status Comment   Specimen Description PLEURAL FLUID LEFT   Final    Special Requests   Final    Gram Stain     Final    Value: RARE WBC PRESENT, PREDOMINANTLY PMN     NO ORGANISMS SEEN   Culture NO GROWTH 3 DAYS   Final    Report Status 12/31/2011 FINAL   Final   FUNGUS CULTURE W SMEAR     Status: Normal (Preliminary result)   Collection Time   12/28/11  3:30 PM      Component Value Range Status Comment   Specimen Description PLEURAL FLUID LEFT   Final    Special Requests   Final    Fungal Smear NO YEAST OR FUNGAL ELEMENTS SEEN  Final    Culture CULTURE IN PROGRESS FOR FOUR WEEKS   Final    Report Status PENDING   Incomplete   BODY FLUID CULTURE     Status: Normal   Collection Time   12/29/11 11:40 AM      Component Value Range Status Comment   Specimen Description PLEURAL FLUID RIGHT   Final    Special Requests   Final    Gram Stain     Final    Value: NO WBC SEEN     NO ORGANISMS SEEN   Culture NO GROWTH 3 DAYS   Final    Report Status 01/02/2012 FINAL   Final   AFB CULTURE WITH SMEAR     Status: Normal (Preliminary result)   Collection Time   12/29/11 11:40 AM      Component Value Range Status Comment   Specimen Description PLEURAL FLUID RIGHT   Final    Special Requests   Final    ACID FAST SMEAR NO ACID FAST BACILLI SEEN   Final    Culture     Final    Value: CULTURE WILL BE EXAMINED FOR 6 WEEKS BEFORE ISSUING A FINAL REPORT   Report Status PENDING   Incomplete   FUNGUS CULTURE W SMEAR     Status: Normal (Preliminary result)   Collection Time   12/29/11 11:40 AM      Component Value Range Status Comment   Specimen Description PLEURAL FLUID RIGHT   Final    Special Requests   Final    Fungal Smear NO YEAST OR FUNGAL ELEMENTS SEEN   Final    Culture CULTURE IN PROGRESS FOR FOUR WEEKS   Final    Report Status PENDING   Incomplete   BODY FLUID CULTURE     Status: Normal (Preliminary result)   Collection Time   01/02/12  10:29 AM      Component Value Range Status Comment   Specimen Description PERITONEAL   Final    Special Requests NONE   Final    Gram Stain     Final    Value: NO WBC SEEN     NO ORGANISMS SEEN   Culture NO GROWTH 1 DAY   Final    Report Status PENDING   Incomplete      Studies: Dg Chest 2 View  01/03/2012  *RADIOLOGY REPORT*  Clinical Data: 66 year old female with shortness of breath. History of breast cancer.  CHEST - 2 VIEW  Comparison: 12/31/2011 and earlier.  Findings: Continued moderate left pleural effusion, mildly increased.  Smaller right pleural effusion appears more stable. Associated patchy bibasilar opacity as before.  No pneumothorax or edema.  Stable cardiac size and mediastinal contours.  Sequelae of CABG.  Postoperative changes to the left chest wall. No acute osseous abnormality identified.  IMPRESSION: Continued bilateral pleural effusions.  The moderate effusion on the left shows some further increase since 12/31/2011.   Original Report Authenticated By: Erskine Speed, M.D.    US Transvaginal Non-ob  01/02/2012  *RADIOLOGY REPORT*  Clinical Data: Adnexal mass, peritoneal carcinomatosis  TRANSABDOMINAL AND TRANSVAGINAL ULTRASOUND OF PELVIS Technique:  Both transabdominal and transvaginal ultrasound examinations of the pelvis were performed. Transabdominal technique was performed for global imaging of the pelvis including uterus, ovaries, adnexal regions, and pelvic cul-de-sac.  It was necessary to proceed with endovaginal exam following the transabdominal exam to visualize the right ovary.  Comparison:  None  Findings:  Uterus: Surgically absent.  Right ovary:  Complex cystic right adnexal mass measuring 3.1 x 5.7 x 4.5 cm with surrounding rim of suspected ovarian tissue.  Left ovary: Not visualized transabdominally/transvaginally. Correlate with surgical history.  Other findings: Moderate pelvic ascites with internal echoes.  IMPRESSION: 5.7 cm complex cystic right ovarian lesion.   Given the associated findings on CT, this appearance is worrisome for primary ovarian neoplasm.  Status post hysterectomy.  Left ovary is not discretely visualized.  Correlate with surgical history.   Original Report Authenticated By: Charline Bills, M.D.    US Pelvis Complete  01/02/2012  *RADIOLOGY REPORT*  Clinical Data: Adnexal mass, peritoneal carcinomatosis  TRANSABDOMINAL AND TRANSVAGINAL ULTRASOUND OF PELVIS Technique:  Both transabdominal and transvaginal ultrasound examinations of the pelvis were performed. Transabdominal technique was performed for global imaging of the pelvis including uterus, ovaries, adnexal regions, and pelvic cul-de-sac.  It was necessary to proceed with endovaginal exam following the transabdominal exam to visualize the right ovary.  Comparison:  None  Findings:  Uterus: Surgically absent.  Right ovary:  Complex cystic right adnexal mass measuring 3.1 x 5.7 x 4.5 cm with surrounding rim of suspected ovarian tissue.  Left ovary: Not visualized transabdominally/transvaginally. Correlate with surgical history.  Other findings: Moderate pelvic ascites with internal echoes.  IMPRESSION: 5.7 cm complex cystic right ovarian lesion.  Given the associated findings on CT, this appearance is worrisome for primary ovarian neoplasm.  Status post hysterectomy.  Left ovary is not discretely visualized.  Correlate with surgical history.   Original Report Authenticated By: Charline Bills, M.D.    US Paracentesis  01/02/2012  *RADIOLOGY REPORT*  Clinical Data: New onset ascites and pleural effusions.  History of breast cancer.  Now with abdominal abnormalities concerning for gynecological malignancy.  Request has been made for therapeutic and diagnostic paracentesis.  ULTRASOUND GUIDED PARACENTESIS  Comparison:  CT imaging of the abdomen and pelvis  An ultrasound guided paracentesis was thoroughly discussed with the patient and questions answered.  The benefits, risks, alternatives and  complications were also discussed.  The patient understands and wishes to proceed with the procedure.  Written consent was obtained.  Ultrasound was performed to localize and mark an adequate pocket of fluid in the right lower quadrant of the abdomen.  The area was then prepped and draped in the normal sterile fashion.  1% Lidocaine was used for local anesthesia.  Under ultrasound guidance a 19 gauge Yueh catheter was introduced.  Paracentesis was performed.  The catheter was removed and a dressing applied.  Complications:  None immediate  Findings:  A total of approximately 1.1 liters of bloody serous fluid was removed.  A fluid sample was sent for laboratory analysis.  IMPRESSION: Successful ultrasound guided paracentesis yielding 1.1 liters of ascites.  Read by: Anselm Pancoast, P.A.-C   Original Report Authenticated By: Richarda Overlie, M.D.     Scheduled Meds:    . aspirin  325 mg Oral Daily  . atorvastatin  20 mg Oral QPC supper  . carvedilol  25 mg Oral BID WC  . docusate sodium  100 mg Oral BID  . enoxaparin (LOVENOX) injection  40 mg Subcutaneous Q24H  . feeding supplement  237 mL Oral BID BM  . oxybutynin  10 mg Oral Daily  . pantoprazole  40 mg Oral Daily  . polyvinyl alcohol  1 drop Both Eyes Daily  . sodium chloride  3 mL Intravenous Q12H  . sodium chloride  3 mL Intravenous Q12H  . tamoxifen  20 mg Oral  Daily   Continuous Infusions:   Principal Problem:  *Pleural effusion Active Problems:  Breast cancer  Pure hypercholesterolemia  Coronary artery disease  Tachycardia  Dyspnea  HTN (hypertension)  Ascites  Peritoneal carcinomatosis    Time spent: > 40 minutes    Penny Pia  [facility] Pager [phone]. If 8PM-8AM, please contact night-coverage at [email or web address], password [facility] 01/03/2012, 6:53 PM  LOS: 6 days

## 2012-01-04 DIAGNOSIS — R55 Syncope and collapse: Secondary | ICD-10-CM

## 2012-01-04 LAB — CREATININE, SERUM
Creatinine, Ser: 0.72 mg/dL (ref 0.50–1.10)
GFR calc non Af Amer: 88 mL/min — ABNORMAL LOW (ref >90)

## 2012-01-04 MED ORDER — ENSURE PUDDING PO PUDG
1.0000 | Freq: Three times a day (TID) | ORAL | Status: DC
  Administered 2012-01-04 – 2012-01-08 (×6): 1 via ORAL
  Filled 2012-01-04 (×6): qty 1

## 2012-01-04 NOTE — Care Management Note (Signed)
    Page 1 of 2   01/08/2012     3:58:17 PM   CARE MANAGEMENT NOTE 01/08/2012  Patient:  [name] [name], [name] [name]   Account Number:  [id number]  Date Initiated:  01/04/2012  Documentation initiated by:  GRAVES-BIGELOW,BRENDA  Subjective/Objective Assessment:   Pt admitted with B Pleural Effusions. Pt is from home with husband.     Action/Plan:   CM did speak to pt and offerred choice for [facility] services. Pt has to speak to family. Pt will benefit from [facility] and [facility] PT services. Pt is agreeable to services. Will need a MD order for above recommendations.   Anticipated DC Date:  01/06/2012   Anticipated DC Plan:  HOME W HOME HEALTH SERVICES      DC Planning Services  CM consult      [facility] Choice  HOME HEALTH   Choice offered to / List presented to:  C-1 Patient        HH arranged  HH-1 RN  HH-2 PT      [facility] agency  [facility]   Status of service:  Completed, signed off Medicare Important Message given?   (If response is "NO", the following Medicare IM given date fields will be blank) Date Medicare IM given:   Date Additional Medicare IM given:    Discharge Disposition:  HOME W HOME HEALTH SERVICES  Per UR Regulation:  Reviewed for med. necessity/level of care/duration of stay  If discussed at Long Length of Stay Meetings, dates discussed:   01/04/2012  01/06/2012    Comments:  01/08/2012 Colleen Can BSN RN CCM [phone] Pt states she will be going home today with spouse as primary caregiver. Wants [facility] for [facility] services. [facility] notified; spoke with Chasity at Intake who states they can provide services with start date of tomorrow 01/09/2012. Pt will require pleurx drain care. Pt has box of drainage bottles 6 in room which she will take home.[facility]  states they will order more bottlles as needed. Faxed HH orders and face sheet to [facility] with confirmation. Contact information for [facility] agency given to patient.   01/07/12- 1400-  Donn Pierini RN, BSN [phone] Pt s/p bil pleurx drain placements and portacath- to tx to [facility] today for chemo start.

## 2012-01-04 NOTE — Progress Notes (Signed)
[facility] PROGRESS NOTE  [name] [name] NWG:[id number] DOB: 11/04/1945 DOA: 12/28/2011 PCP: Lavonda Jumbo, MD Brief History: - Patient is a 66 y/o CF with history of breast cancer 3 years ago treated with lumpectomy and radiation therapy.  Presented with pleural effusions that were secondary to suspected new malignancy (adnexal vs breast- most likely adnexal given mass and peritoneal carcinomatosis).  Also found to have ascites which has been tapped and sent to cytology.  Currently awaiting full work up and heme/onc following, GYN oncologist to evaluate patient 01/05/12 for further recommendations.  CT surgeon (same surgeon Dr. Laneta Simmers that performed patient's CABG was consulted today 01/04/12) consulted for recurring pleural effusions.  Assessment/Plan: 1. Pleural effusion :  - Pulmonology on board initially and have made their recommendations.  Cytology report by pathologist suggests that this is malignancy either from breast metastasis vs adnexal gynecological.  Was informed about cytology 12/31/11 and subsequently placed consult to patient's oncologist Dr. Welton Flakes who is following and making further recommendations.  Very much want to thank Dr. Welton Flakes for her input in this case. - I have also placed consult to GYN for further recommendations and they have indicated that GYN oncologist will follow up with patient likely this upcomming Wednesday 01/05/12 - Dr. Laneta Simmers consulted 01/04/12 for recurrent pleural effusion.  - Will f/u with heme/onc recommendations at this juncture. - Disposition will depending on recommended work up and therapy by oncology/pulmonology/GYN oncologist - Ct of abdomen and pelvis showing Peritoneal carcinomatosis, inguinal lymphadenopathy, nonspecific anterior subcutaneous nodules, and 5.4 cm right adnexal cyst interpreted as indeterminate. - Paracentesis with cytology on 01/02/12 - Transvaginal ultrasound complete 01/02/12 - Also placed order for port a cath to be  inserted by IR as patient may require chemotherapy. Based on note from radiology they will await input from Oncologist prior to inserting port a cath. - Antibiotics were initiated during initial evaluation for suspected pneumonia or pleural effusions 2ary to infectious etiology.  This is no longer the case and effusions likely 2ary to malignancy.  Therefore will d/c antibiotics at this juncture.  Patient afebrile, no cough, non toxic, and wbc within normal limits.  2. Tachycardia: Resolved, CT angiogram negative.  Heart rate WNL's and last recorded 83. 3. Dyspnea: resolved after thoracentesis and was 2ary to pleural effusions (BL) likely 2ary to malignancy 4. CAD s/p CABG: Continue aspirin, atorvastatin 5. H/o breast cancer: Per oncologist. 6. Htn: continue current regimen will continue to monitor. 7. Syncopal episode:  (occured 01/03/12 while doing physical therapy) - Etiology uncertain. No red flags reported on telemetry monitoring - Suspect orthostatic hypotension - No seizure like activity reported.  Will defer EEG - order carotid doppler 01/04/12 - Patient had reportedly hypoxia when evaluated during syncopal episode. Chest x ray ordered and pleural effusion recurring.    Code Status: full Family Communication: None at bedside Disposition Plan: Depending on further recommendations from specialist, hematologist, gyn oncologist, ct surgeon   Consultants:  Pulmonary : Dr. Craige Cotta  Oncology: Dr. Welton Flakes  GYN oncologist  CT surgeon: Dr. Laneta Simmers  Procedures:  Thoracentesis 12/28/11  Paracentesis 01/02/12  Antibiotics:  levaquin d/c 01/02/12  Trimethoprim 01/02/12  HPI/Subjective: No episodes of syncope reported today.   Objective: Filed Vitals:   01/03/12 1711 01/03/12 2100 01/04/12 0500 01/04/12 1400  BP: 111/70 94/60 110/73 96/63  Pulse: 92 91 96 83  Temp:  99.1 F (37.3 C) 98.5 F (36.9 C) 98.3 F (36.8 C)  TempSrc:  Oral Oral   Resp:  16 16 18  Height:        Weight:   62.415 kg (137 lb 9.6 oz)   SpO2:  92% 94% 93%    Intake/Output Summary (Last 24 hours) at 01/04/12 1909 Last data filed at 01/04/12 0919  Gross per 24 hour  Intake    240 ml  Output      0 ml  Net    240 ml   Filed Weights   01/02/12 0500 01/03/12 0500 01/04/12 0500  Weight: 61.9 kg (136 lb 7.4 oz) 60.9 kg (134 lb 4.2 oz) 62.415 kg (137 lb 9.6 oz)    Exam:   General:  Pt in NAD, Alert and Oriented x 3  Cardiovascular: RRR, No MRG  Breast: No palpable masses or lymphadenopathy  Respiratory: CTA BL, decreased breath sounds at bases worse today, no wheezes, speaking in full sentences  Abdomen: soft, NT, nodules flanking umbilicus (suspicious for sister mary joseph nodules)  Skin: Swollen lymph node left inguinal area and surrounding umbilicus non painful to palpation.  Data Reviewed: Basic Metabolic Panel:  Lab 01/04/12 1478 01/01/12 0535 12/29/11 0218  NA -- 133* 131*  K -- 3.8 3.8  CL -- 99 97  CO2 -- 23 21  GLUCOSE -- 113* 116*  BUN -- 13 13  CREATININE 0.72 0.69 0.71  CALCIUM -- 8.3* 8.0*  MG -- -- --  PHOS -- -- --   Liver Function Tests: No results found for this basename: AST:5,ALT:5,ALKPHOS:5,BILITOT:5,PROT:5,ALBUMIN:5 in the last 168 hours No results found for this basename: LIPASE:5,AMYLASE:5 in the last 168 hours No results found for this basename: AMMONIA:5 in the last 168 hours CBC:  Lab 01/01/12 0535 12/29/11 0218  WBC 8.6 7.6  NEUTROABS -- --  HGB 9.5* 9.8*  HCT 29.3* 30.9*  MCV 81.4 81.5  PLT 475* 462*   Cardiac Enzymes:  Lab 12/29/11 0218 12/28/11 1938  CKTOTAL -- --  CKMB -- --  CKMBINDEX -- --  TROPONINI <0.30 <0.30   BNP (last 3 results)  Basename 12/28/11 1109  PROBNP 828.2*   CBG: No results found for this basename: GLUCAP:5 in the last 168 hours  Recent Results (from the past 240 hour(s))  AFB CULTURE WITH SMEAR     Status: Normal (Preliminary result)   Collection Time   12/28/11  3:30 PM      Component  Value Range Status Comment   Specimen Description PLEURAL FLUID LEFT   Final    Special Requests   Final    ACID FAST SMEAR NO ACID FAST BACILLI SEEN   Final    Culture     Final    Value: CULTURE WILL BE EXAMINED FOR 6 WEEKS BEFORE ISSUING A FINAL REPORT   Report Status PENDING   Incomplete   BODY FLUID CULTURE     Status: Normal   Collection Time   12/28/11  3:30 PM      Component Value Range Status Comment   Specimen Description PLEURAL FLUID LEFT   Final    Special Requests   Final    Gram Stain     Final    Value: RARE WBC PRESENT, PREDOMINANTLY PMN     NO ORGANISMS SEEN   Culture NO GROWTH 3 DAYS   Final    Report Status 12/31/2011 FINAL   Final   FUNGUS CULTURE W SMEAR     Status: Normal (Preliminary result)   Collection Time   12/28/11  3:30 PM      Component Value Range  Status Comment   Specimen Description PLEURAL FLUID LEFT   Final    Special Requests   Final    Fungal Smear NO YEAST OR FUNGAL ELEMENTS SEEN   Final    Culture CULTURE IN PROGRESS FOR FOUR WEEKS   Final    Report Status PENDING   Incomplete   BODY FLUID CULTURE     Status: Normal   Collection Time   12/29/11 11:40 AM      Component Value Range Status Comment   Specimen Description PLEURAL FLUID RIGHT   Final    Special Requests   Final    Gram Stain     Final    Value: NO WBC SEEN     NO ORGANISMS SEEN   Culture NO GROWTH 3 DAYS   Final    Report Status 01/02/2012 FINAL   Final   AFB CULTURE WITH SMEAR     Status: Normal (Preliminary result)   Collection Time   12/29/11 11:40 AM      Component Value Range Status Comment   Specimen Description PLEURAL FLUID RIGHT   Final    Special Requests   Final    ACID FAST SMEAR NO ACID FAST BACILLI SEEN   Final    Culture     Final    Value: CULTURE WILL BE EXAMINED FOR 6 WEEKS BEFORE ISSUING A FINAL REPORT   Report Status PENDING   Incomplete   FUNGUS CULTURE W SMEAR     Status: Normal (Preliminary result)   Collection Time    12/29/11 11:40 AM      Component Value Range Status Comment   Specimen Description PLEURAL FLUID RIGHT   Final    Special Requests   Final    Fungal Smear NO YEAST OR FUNGAL ELEMENTS SEEN   Final    Culture CULTURE IN PROGRESS FOR FOUR WEEKS   Final    Report Status PENDING   Incomplete   BODY FLUID CULTURE     Status: Normal (Preliminary result)   Collection Time   01/02/12 10:29 AM      Component Value Range Status Comment   Specimen Description PERITONEAL   Final    Special Requests NONE   Final    Gram Stain     Final    Value: NO WBC SEEN     NO ORGANISMS SEEN   Culture NO GROWTH 2 DAYS   Final    Report Status PENDING   Incomplete      Studies: Dg Chest 2 View  01/03/2012  *RADIOLOGY REPORT*  Clinical Data: 66 year old female with shortness of breath. History of breast cancer.  CHEST - 2 VIEW  Comparison: 12/31/2011 and earlier.  Findings: Continued moderate left pleural effusion, mildly increased.  Smaller right pleural effusion appears more stable. Associated patchy bibasilar opacity as before.  No pneumothorax or edema.  Stable cardiac size and mediastinal contours.  Sequelae of CABG.  Postoperative changes to the left chest wall. No acute osseous abnormality identified.  IMPRESSION: Continued bilateral pleural effusions.  The moderate effusion on the left shows some further increase since 12/31/2011.   Original Report Authenticated By: Erskine Speed, M.D.     Scheduled Meds:    . aspirin  325 mg Oral Daily  . atorvastatin  20 mg Oral QPC supper  . carvedilol  25 mg Oral BID WC  . docusate sodium  100 mg Oral BID  . enoxaparin (LOVENOX) injection  40 mg  Subcutaneous Q24H  . feeding supplement  1 Container Oral TID BM  . oxybutynin  10 mg Oral Daily  . pantoprazole  40 mg Oral Daily  . polyvinyl alcohol  1 drop Both Eyes Daily  . sodium chloride  3 mL Intravenous Q12H  . sodium chloride  3 mL Intravenous Q12H  . tamoxifen  20 mg Oral Daily  . [DISCONTINUED] feeding  supplement  237 mL Oral BID BM   Continuous Infusions:   Principal Problem:  *Pleural effusion Active Problems:  Breast cancer  Pure hypercholesterolemia  Coronary artery disease  Tachycardia  Dyspnea  HTN (hypertension)  Ascites  Peritoneal carcinomatosis  Syncopal episodes    Time spent: > 40 minutes    Penny Pia  [facility] Pager [phone]. If 8PM-8AM, please contact night-coverage at [email or web address], password [facility] 01/04/2012, 7:09 PM  LOS: 7 days

## 2012-01-04 NOTE — Progress Notes (Signed)
Physical Therapy Treatment Patient Details Name: [name] [name] MRN: [id number] DOB: 1945/03/25 Today's Date: 01/04/2012 Time: 1014-1040 PT Time Calculation (min): 26 min  PT Assessment / Plan / Recommendation Comments on Treatment Session  pt rpesents with Pleural Effusions and Mets to Lungs.  pt notes feeling better today, but nervous she will pass out again.  vitals remained stable during mobility.  pt frustrated by medical situation and by new grandchild being born last week and being unable to go visit yet.      Follow Up Recommendations  Home health PT;Supervision - Intermittent     Does the patient have the potential to tolerate intense rehabilitation     Barriers to Discharge        Equipment Recommendations  None recommended by PT    Recommendations for Other Services    Frequency Min 3X/week   Plan Frequency remains appropriate;Discharge plan needs to be updated    Precautions / Restrictions Precautions Precautions: Fall Restrictions Weight Bearing Restrictions: No   Pertinent Vitals/Pain Denies pain.      Mobility  Bed Mobility Bed Mobility: Supine to Sit;Sitting - Scoot to Edge of Bed Supine to Sit: 6: Modified independent (Device/Increase time);HOB elevated Sitting - Scoot to Edge of Bed: 7: Independent Transfers Transfers: Sit to Stand;Stand to Sit Sit to Stand: 5: Supervision;With upper extremity assist;From bed Stand to Sit: 5: Supervision;With upper extremity assist;To chair/3-in-1;With armrests Details for Transfer Assistance: Demos good use of UEs.   Ambulation/Gait Ambulation/Gait Assistance: 4: Min guard Ambulation Distance (Feet): 160 Feet Assistive device:  (Occasional use of rail in hallway) Ambulation/Gait Assistance Details: pt with guarded gait secondary to worry about passing out again.  Prior to ambulating pt's BP 119/70 and O2 sats 90% on RA.  During ambulation, no c/o dizziness with O2 sats increasing to 92%.  After ambulating BP  103/70 and O2 sats 94%.  pt a little unsteady throughout ambulation Gait Pattern: Step-through pattern;Decreased stride length;Narrow base of support Stairs: No Wheelchair Mobility Wheelchair Mobility: No    Exercises     PT Diagnosis:    PT Problem List:   PT Treatment Interventions:     PT Goals Acute Rehab PT Goals Time For Goal Achievement: 01/07/12 PT Goal: Ambulate - Progress: Progressing toward goal  Visit Information  Last PT Received On: 01/04/12 Assistance Needed: +1    Subjective Data  Subjective: pt nervous secondary to passing out yesterday.     Cognition  Overall Cognitive Status: Appears within functional limits for tasks assessed/performed Arousal/Alertness: Awake/alert Orientation Level: Appears intact for tasks assessed Behavior During Session: [facility] for tasks performed    Balance  Balance Balance Assessed: Yes Static Standing Balance Static Standing - Balance Support: No upper extremity supported Static Standing - Level of Assistance: 5: Stand by assistance Static Standing - Comment/# of Minutes: pt able to maintain static balance, though very guarded posture.    End of Session PT - End of Session Equipment Utilized During Treatment: Gait belt Activity Tolerance: Patient limited by fatigue Patient left: in chair;with call bell/phone within reach Nurse Communication: Mobility status   GP     Sunny Schlein, [place] [phone] 01/04/2012, 11:06 AM

## 2012-01-04 NOTE — Progress Notes (Signed)
Nutrition Follow-up  Intervention:    Continue Ensure Pudding supplement 3 times daily (170 kcals, 4 gm protein per 4 oz cup) RD to follow for nutrition care plan  Assessment:   Patient s/p paracentesis 11/17.  PO intake variable at 30-90% per flowsheet records.  Patient did not like Ensure Complete supplement; discontinued 11/19.  Ensure Pudding supplement ordered per RN -- patient is taking.  Diet Order:  Heart Healthy  Meds: Scheduled Meds:   . aspirin  325 mg Oral Daily  . atorvastatin  20 mg Oral QPC supper  . carvedilol  25 mg Oral BID WC  . docusate sodium  100 mg Oral BID  . enoxaparin (LOVENOX) injection  40 mg Subcutaneous Q24H  . feeding supplement  1 Container Oral TID BM  . oxybutynin  10 mg Oral Daily  . pantoprazole  40 mg Oral Daily  . polyvinyl alcohol  1 drop Both Eyes Daily  . sodium chloride  3 mL Intravenous Q12H  . sodium chloride  3 mL Intravenous Q12H  . tamoxifen  20 mg Oral Daily  . [DISCONTINUED] feeding supplement  237 mL Oral BID BM   Continuous Infusions:  PRN Meds:.sodium chloride, acetaminophen, acetaminophen, albuterol, ALPRAZolam, alum & mag hydroxide-simeth, HYDROcodone-acetaminophen, nitroGLYCERIN, ondansetron (ZOFRAN) IV, ondansetron, sodium chloride, zolpidem   CMP     Component Value Date/Time   NA 133* 01/01/2012 0535   NA 141 12/17/2011 1144   K 3.8 01/01/2012 0535   CL 99 01/01/2012 0535   CO2 23 01/01/2012 0535   GLUCOSE 113* 01/01/2012 0535   GLUCOSE 78 12/17/2011 1144   BUN 13 01/01/2012 0535   BUN 15 12/17/2011 1144   CREATININE 0.72 01/04/2012 0506   CALCIUM 8.3* 01/01/2012 0535   PROT 7.3 12/28/2011 1553   PROT 6.7 12/17/2011 1144   ALBUMIN 3.8 09/30/2011 1307   AST 19 12/17/2011 1144   ALT 11 12/17/2011 1144   ALKPHOS 63 12/17/2011 1144   BILITOT 0.2 12/17/2011 1144   GFRNONAA 88* 01/04/2012 0506   GFRAA >90 01/04/2012 0506    CBG (last 3)  No results found for this basename: GLUCAP:3 in the last 72  hours   Intake/Output Summary (Last 24 hours) at 01/04/12 1416 Last data filed at 01/04/12 0919  Gross per 24 hour  Intake    480 ml  Output      0 ml  Net    480 ml    Weight Status:  62.4 kg (11/19) -- stable  Estimated needs:  1800-2000 kcals, 85-100 gm protein  Nutrition Dx:  Increased Nutrient Needs r/t energy & protein repletion as evidenced by estimated nutrition needs, ongoing  Goal:  Oral intake with meals & supplements to meet >/= 90% of estimated nutrition needs, progressing  Monitor:  PO & supplemental intake, weight, labs, I/O's  Kirkland Hun, RD, LDN Pager #: [phone] After-Hours Pager #: [phone]

## 2012-01-05 ENCOUNTER — Encounter: Payer: Self-pay | Admitting: Gynecologic Oncology

## 2012-01-05 ENCOUNTER — Encounter (HOSPITAL_COMMUNITY): Payer: Self-pay | Admitting: Physician Assistant

## 2012-01-05 ENCOUNTER — Inpatient Hospital Stay (HOSPITAL_COMMUNITY): Payer: BC Managed Care – PPO

## 2012-01-05 DIAGNOSIS — D649 Anemia, unspecified: Secondary | ICD-10-CM

## 2012-01-05 DIAGNOSIS — C569 Malignant neoplasm of unspecified ovary: Principal | ICD-10-CM

## 2012-01-05 DIAGNOSIS — Z87898 Personal history of other specified conditions: Secondary | ICD-10-CM

## 2012-01-05 DIAGNOSIS — R188 Other ascites: Secondary | ICD-10-CM

## 2012-01-05 LAB — MISCELLANEOUS TEST: Miscellaneous Test: 2003347

## 2012-01-05 LAB — SURGICAL PCR SCREEN: Staphylococcus aureus: NEGATIVE

## 2012-01-05 NOTE — Patient Instructions (Signed)
Follow up with Dr. Khan

## 2012-01-05 NOTE — Progress Notes (Signed)
[name] [name]   DOB:Feb 17, 1945   ZO#:[id number]   [id number]#:[id number]  Subjective: 66 year old female with now new diagnosis of ovarian carcinoma. Patient is well-known to me since I have taken care of her for her noninvasive breast cancer. She has been on tamoxifen for that. Patient also has a recent history of coronary artery bypass graft. She presented now with bilateral pleural effusions and abdominal ascites and adnexal mass. Patient had a paracentesis and thoracentesis performed. Today clinically patient seems to be doing well however she continues to be significantly short of breath and her ascites has reaccumulated. She was just seen by Dr. Lavinia Sharps. He has agreed to have a Pleuryx catheter placed as well as put a port in. I am hoping to get her started on chemotherapy on Friday. She will be transferred to [facility] long after she has her procedures performed tomorrow. I've spoken to Dr. Darnelle Catalan regarding patient's transfer to [facility] long period   Objective:  Filed Vitals:   01/05/12 0500  BP: 114/74  Pulse: 92  Temp: 98.6 F (37 C)  Resp: 18    Body mass index is 22.00 kg/(m^2).  Intake/Output Summary (Last 24 hours) at 01/05/12 0905 Last data filed at 01/04/12 0919  Gross per 24 hour  Intake    240 ml  Output      0 ml  Net    240 ml    Patient is awake alert with temporal wasting very pale  Sclerae unicteric  Oropharynx clear  No peripheral adenopathy  Lungs clear -- no rales or rhonchi, decreased breath sounds bilaterally  Heart regular rate and rhythm  Abdomen is distended with ascitic fluid and Sr. Britta Mccreedy nodes.  MSK no focal spinal tenderness, no peripheral edema  Neuro nonfocal    CBG (last 3)  No results found for this basename: GLUCAP:3 in the last 72 hours   Labs:  Lab Results  Component Value Date   WBC 8.6 01/01/2012   HGB 9.5* 01/01/2012   HCT 29.3* 01/01/2012   MCV 81.4 01/01/2012   PLT 475* 01/01/2012   NEUTROABS 6.9 12/28/2011    Urine  Studies No results found for this basename: UACOL:2,UAPR:2,USPG:2,UPH:2,UTP:2,UGL:2,UKET:2,UBIL:2,UHGB:2,UNIT:2,UROB:2,ULEU:2,UEPI:2,UWBC:2,URBC:2,UBAC:2,CAST:2,CRYS:2,UCOM:2,BILUA:2 in the last 72 hours  Basic Metabolic Panel:  Lab 01/04/12 6213 01/01/12 0535  NA -- 133*  K -- 3.8  CL -- 99  CO2 -- 23  GLUCOSE -- 113*  BUN -- 13  CREATININE 0.72 0.69  CALCIUM -- 8.3*  MG -- --  PHOS -- --   GFR Estimated Creatinine Clearance: 65.6 ml/min (by C-G formula based on Cr of 0.72). Liver Function Tests: No results found for this basename: AST:5,ALT:5,ALKPHOS:5,BILITOT:5,PROT:5,ALBUMIN:5 in the last 168 hours No results found for this basename: LIPASE:5,AMYLASE:5 in the last 168 hours No results found for this basename: AMMONIA:5 in the last 168 hours Coagulation profile No results found for this basename: INR:5,PROTIME:5 in the last 168 hours  CBC:  Lab 01/01/12 0535  WBC 8.6  NEUTROABS --  HGB 9.5*  HCT 29.3*  MCV 81.4  PLT 475*   Cardiac Enzymes: No results found for this basename: CKTOTAL:5,CKMB:5,CKMBINDEX:5,TROPONINI:5 in the last 168 hours BNP: No components found with this basename: POCBNP:5 CBG: No results found for this basename: GLUCAP:5 in the last 168 hours D-Dimer No results found for this basename: DDIMER:2 in the last 72 hours Hgb A1c No results found for this basename: HGBA1C:2 in the last 72 hours Lipid Profile No results found for this basename: CHOL:2,HDL:2,LDLCALC:2,TRIG:2,CHOLHDL:2,LDLDIRECT:2 in the last 72 hours Thyroid  function studies No results found for this basename: TSH,T4TOTAL,FREET3,T3FREE,THYROIDAB in the last 72 hours Anemia work up No results found for this basename: VITAMINB12:2,FOLATE:2,FERRITIN:2,TIBC:2,IRON:2,RETICCTPCT:2 in the last 72 hours Microbiology Recent Results (from the past 240 hour(s))  AFB CULTURE WITH SMEAR     Status: Normal (Preliminary result)   Collection Time   12/28/11  3:30 PM      Component Value Range  Status Comment   Specimen Description PLEURAL FLUID LEFT   Final    Special Requests   Final    ACID FAST SMEAR NO ACID FAST BACILLI SEEN   Final    Culture     Final    Value: CULTURE WILL BE EXAMINED FOR 6 WEEKS BEFORE ISSUING A FINAL REPORT   Report Status PENDING   Incomplete   BODY FLUID CULTURE     Status: Normal   Collection Time   12/28/11  3:30 PM      Component Value Range Status Comment   Specimen Description PLEURAL FLUID LEFT   Final    Special Requests   Final    Gram Stain     Final    Value: RARE WBC PRESENT, PREDOMINANTLY PMN     NO ORGANISMS SEEN   Culture NO GROWTH 3 DAYS   Final    Report Status 12/31/2011 FINAL   Final   FUNGUS CULTURE W SMEAR     Status: Normal (Preliminary result)   Collection Time   12/28/11  3:30 PM      Component Value Range Status Comment   Specimen Description PLEURAL FLUID LEFT   Final    Special Requests   Final    Fungal Smear NO YEAST OR FUNGAL ELEMENTS SEEN   Final    Culture CULTURE IN PROGRESS FOR FOUR WEEKS   Final    Report Status PENDING   Incomplete   BODY FLUID CULTURE     Status: Normal   Collection Time   12/29/11 11:40 AM      Component Value Range Status Comment   Specimen Description PLEURAL FLUID RIGHT   Final    Special Requests   Final    Gram Stain     Final    Value: NO WBC SEEN     NO ORGANISMS SEEN   Culture NO GROWTH 3 DAYS   Final    Report Status 01/02/2012 FINAL   Final   AFB CULTURE WITH SMEAR     Status: Normal (Preliminary result)   Collection Time   12/29/11 11:40 AM      Component Value Range Status Comment   Specimen Description PLEURAL FLUID RIGHT   Final    Special Requests   Final    ACID FAST SMEAR NO ACID FAST BACILLI SEEN   Final    Culture     Final    Value: CULTURE WILL BE EXAMINED FOR 6 WEEKS BEFORE ISSUING A FINAL REPORT   Report Status PENDING   Incomplete   FUNGUS CULTURE W SMEAR     Status: Normal (Preliminary result)   Collection Time   12/29/11 11:40 AM       Component Value Range Status Comment   Specimen Description PLEURAL FLUID RIGHT   Final    Special Requests   Final    Fungal Smear NO YEAST OR FUNGAL ELEMENTS SEEN   Final    Culture CULTURE IN PROGRESS FOR FOUR WEEKS   Final    Report Status PENDING  Incomplete   BODY FLUID CULTURE     Status: Normal (Preliminary result)   Collection Time   01/02/12 10:29 AM      Component Value Range Status Comment   Specimen Description PERITONEAL   Final    Special Requests NONE   Final    Gram Stain     Final    Value: NO WBC SEEN     NO ORGANISMS SEEN   Culture NO GROWTH 2 DAYS   Final    Report Status PENDING   Incomplete       Studies:  Dg Chest 2 View  01/03/2012  *RADIOLOGY REPORT*  Clinical Data: 66 year old female with shortness of breath. History of breast cancer.  CHEST - 2 VIEW  Comparison: 12/31/2011 and earlier.  Findings: Continued moderate left pleural effusion, mildly increased.  Smaller right pleural effusion appears more stable. Associated patchy bibasilar opacity as before.  No pneumothorax or edema.  Stable cardiac size and mediastinal contours.  Sequelae of CABG.  Postoperative changes to the left chest wall. No acute osseous abnormality identified.  IMPRESSION: Continued bilateral pleural effusions.  The moderate effusion on the left shows some further increase since 12/31/2011.   Original Report Authenticated By: Erskine Speed, M.D.     Assessment: 66 y.o. 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  #2 new diagnosis of what looks like ovarian carcinoma her CA 125 is over 300. CEA and CA 2729 were normal. Patient does have bilateral pleural effusions as well as ascites. I have asked gynecologic oncology to see the patient while she is at [facility].  #3 bilateral pleural effusions patient will have a Pleuryx catheters placed.  #4 ascites I have ordered a paracentesis again.  #5 patient is anemic and we do need to continue to monitor her CBC.  #6 coronary artery disease  she is being seen by [facility].  #7 after patient has had her Pleurex catheter and Port-A-Cath placed I will plan on getting her transfer to [facility] long to begin cycle 1 of chemotherapy which will consist of Taxol and carboplatinum. I have discussed this treatment with the patient and she is in agreement    Plan:   #1 patient will proceed with Pleuryx catheter placement for pleural effusions as well as a Port-A-Cath placement.  #2 she will have a paracentesis performed today.  #3 we will plan on transferring her to [place] long on Thursday or Friday.  #4 I plan on having her begin chemotherapy starting on Friday or possibly Saturday.  #5 patient remains a full code   Drue Second 01/05/2012

## 2012-01-05 NOTE — Progress Notes (Signed)
01/05/12 1700  Clinical Encounter Type  Visited With Patient and family together  Visit Type Initial  Referral From Chaplain   I visited with the patient and her husband. Patient will be transferred to [facility] in a few days. Veryl Speak

## 2012-01-05 NOTE — Progress Notes (Signed)
[facility] PROGRESS NOTE  TAVIE HASEMAN AVW:[id number] DOB: 08-02-1945 DOA: 12/28/2011 PCP: Lavonda Jumbo, MD Brief History: - Patient is a 66 y/o CF with history of breast cancer 3 years ago treated with lumpectomy and radiation therapy.  Presented with pleural effusions that were secondary to suspected new malignancy (adnexal vs breast- most likely adnexal given mass and peritoneal carcinomatosis).  Also found to have ascites which has been tapped and sent to cytology.  Currently awaiting full work up and heme/onc following, GYN oncologist to evaluate patient 01/05/12 for further recommendations.  CT surgeon (same surgeon Dr. Laneta Simmers that performed patient's CABG was consulted today 01/04/12) consulted who will place bilat pleurex catheters tomorrow.  Once this is complete, pt will be transferred to [facility] for chemo.  Assessment/Plan: 1. Pleural effusion :  - Pulmonology on board initially and have made their recommendations.  Cytology report by pathologist suggests that this is malignancy either from breast metastasis vs adnexal gynecological.  Was informed about cytology 12/31/11 and subsequently placed consult to patient's oncologist Dr. Welton Flakes who is following and making further recommendations.   - I have also placed consult to GYN for further recommendations and they have indicated that GYN oncologist will follow up with patient likely this upcomming Wednesday 01/05/12 - Dr. Laneta Simmers consulted 01/04/12 for recurrent pleural effusion.  - Will f/u with heme/onc recommendations at this juncture. - Disposition will depending on recommended work up and therapy by oncology/pulmonology/GYN oncologist - Ct of abdomen and pelvis showing Peritoneal carcinomatosis, inguinal lymphadenopathy, nonspecific anterior subcutaneous nodules, and 5.4 cm right adnexal cyst interpreted as indeterminate. - Paracentesis with cytology on 01/02/12.  Reaccumulation of ascites & 2nd paracentesis done today  11/20 - Transvaginal ultrasound complete 01/02/12 - Also placed order for port a cath to be inserted by IR as patient may require chemotherapy. Based on note from radiology they will await input from Oncologist prior to inserting port a cath. - Antibiotics were initiated during initial evaluation for suspected pneumonia or pleural effusions 2ary to infectious etiology.  This is no longer the case and effusions likely 2ary to malignancy.  Therefore will d/c antibiotics at this juncture.  Patient afebrile, no cough, non toxic, and wbc within normal limits.  2. Tachycardia: Resolved, CT angiogram negative.  Heart rate WNL's and last recorded 83. 3. Dyspnea: resolved after thoracentesis and was 2ary to pleural effusions (BL) likely 2ary to malignancy 4. CAD s/p CABG: Continue aspirin, atorvastatin 5. H/o breast cancer: Per oncologist. 6. Htn: continue current regimen will continue to monitor. 7. Syncopal episode:  (occured 01/03/12 while doing physical therapy) - Etiology uncertain. No red flags reported on telemetry monitoring - Suspect orthostatic hypotension - No seizure like activity reported.  Will defer EEG - order carotid doppler 01/04/12 - Patient had reportedly hypoxia when evaluated during syncopal episode. Chest x ray ordered and pleural effusion recurring.    Code Status: full Family Communication: None at bedside Disposition Plan: Depending on further recommendations from specialist, hematologist, gyn oncologist, ct surgeon   Consultants:  Pulmonary : Dr. Craige Cotta  Oncology: Dr. Welton Flakes  GYN oncologist  CT surgeon: Dr. Laneta Simmers  Procedures:  Thoracentesis 12/28/11  Paracentesis 01/02/12 & 11/20  Antibiotics:  levaquin d/c 01/02/12  Trimethoprim 01/02/12  HPI/Subjective: Seen post paracentesis.  Tired, but better after fluid off.  Breathing easier.  No pain.    Objective: Filed Vitals:   01/05/12 0500 01/05/12 1014 01/05/12 1023 01/05/12 1400  BP: 114/74 102/60  97/58 108/72  Pulse: 92   87  Temp: 98.6 F (37 C)   97.5 F (36.4 C)  TempSrc: Oral   Oral  Resp: 18   18  Height:      Weight: 61.825 kg (136 lb 4.8 oz)     SpO2: 92%   94%    Intake/Output Summary (Last 24 hours) at 01/05/12 1435 Last data filed at 01/05/12 1421  Gross per 24 hour  Intake    120 ml  Output      0 ml  Net    120 ml   Filed Weights   01/03/12 0500 01/04/12 0500 01/05/12 0500  Weight: 60.9 kg (134 lb 4.2 oz) 62.415 kg (137 lb 9.6 oz) 61.825 kg (136 lb 4.8 oz)    Exam:   General:  Pt in NAD, Alert and Oriented x 3  Cardiovascular: RRR, No MRG  Breast: No palpable masses or lymphadenopathy  Respiratory: CTA BL, decreased breath sounds at bases worse today, no wheezes, speaking in full sentences  Abdomen: soft, NT, nodules flanking umbilicus (suspicious for sister mary joseph nodules)  Skin: Swollen lymph node left inguinal area and surrounding umbilicus non painful to palpation.  Data Reviewed: Basic Metabolic Panel:  Lab 01/04/12 1610 01/01/12 0535  NA -- 133*  K -- 3.8  CL -- 99  CO2 -- 23  GLUCOSE -- 113*  BUN -- 13  CREATININE 0.72 0.69  CALCIUM -- 8.3*  MG -- --  PHOS -- --   CBC:  Lab 01/01/12 0535  WBC 8.6  NEUTROABS --  HGB 9.5*  HCT 29.3*  MCV 81.4  PLT 475*   BNP (last 3 results)  Basename 12/28/11 1109  PROBNP 828.2*     Recent Results (from the past 240 hour(s))  AFB CULTURE WITH SMEAR     Status: Normal (Preliminary result)   Collection Time   12/28/11  3:30 PM      Component Value Range Status Comment   Specimen Description PLEURAL FLUID LEFT   Final    Special Requests   Final    ACID FAST SMEAR NO ACID FAST BACILLI SEEN   Final    Culture     Final    Value: CULTURE WILL BE EXAMINED FOR 6 WEEKS BEFORE ISSUING A FINAL REPORT   Report Status PENDING   Incomplete   BODY FLUID CULTURE     Status: Normal   Collection Time   12/28/11  3:30 PM      Component Value Range Status Comment   Specimen  Description PLEURAL FLUID LEFT   Final    Special Requests   Final    Gram Stain     Final    Value: RARE WBC PRESENT, PREDOMINANTLY PMN     NO ORGANISMS SEEN   Culture NO GROWTH 3 DAYS   Final    Report Status 12/31/2011 FINAL   Final   FUNGUS CULTURE W SMEAR     Status: Normal (Preliminary result)   Collection Time   12/28/11  3:30 PM      Component Value Range Status Comment   Specimen Description PLEURAL FLUID LEFT   Final    Special Requests   Final    Fungal Smear NO YEAST OR FUNGAL ELEMENTS SEEN   Final    Culture CULTURE IN PROGRESS FOR FOUR WEEKS   Final    Report Status PENDING   Incomplete   BODY FLUID CULTURE     Status: Normal   Collection Time   12/29/11  11:40 AM      Component Value Range Status Comment   Specimen Description PLEURAL FLUID RIGHT   Final    Special Requests   Final    Gram Stain     Final    Value: NO WBC SEEN     NO ORGANISMS SEEN   Culture NO GROWTH 3 DAYS   Final    Report Status 01/02/2012 FINAL   Final   AFB CULTURE WITH SMEAR     Status: Normal (Preliminary result)   Collection Time   12/29/11 11:40 AM      Component Value Range Status Comment   Specimen Description PLEURAL FLUID RIGHT   Final    Special Requests   Final    ACID FAST SMEAR NO ACID FAST BACILLI SEEN   Final    Culture     Final    Value: CULTURE WILL BE EXAMINED FOR 6 WEEKS BEFORE ISSUING A FINAL REPORT   Report Status PENDING   Incomplete   FUNGUS CULTURE W SMEAR     Status: Normal (Preliminary result)   Collection Time   12/29/11 11:40 AM      Component Value Range Status Comment   Specimen Description PLEURAL FLUID RIGHT   Final    Special Requests   Final    Fungal Smear NO YEAST OR FUNGAL ELEMENTS SEEN   Final    Culture CULTURE IN PROGRESS FOR FOUR WEEKS   Final    Report Status PENDING   Incomplete   BODY FLUID CULTURE     Status: Normal (Preliminary result)   Collection Time   01/02/12 10:29 AM      Component Value Range Status Comment    Specimen Description PERITONEAL   Final    Special Requests NONE   Final    Gram Stain     Final    Value: NO WBC SEEN     NO ORGANISMS SEEN   Culture NO GROWTH 3 DAYS   Final    Report Status PENDING   Incomplete      Studies: Dg Chest 2 View  01/03/2012  IMPRESSION: Continued bilateral pleural effusions.  The moderate effusion on the left shows some further increase since 12/31/2011.   Original Report Authenticated By: Erskine Speed, M.D.     Scheduled Meds:    . aspirin  325 mg Oral Daily  . atorvastatin  20 mg Oral QPC supper  . carvedilol  25 mg Oral BID WC  . docusate sodium  100 mg Oral BID  . enoxaparin (LOVENOX) injection  40 mg Subcutaneous Q24H  . feeding supplement  1 Container Oral TID BM  . oxybutynin  10 mg Oral Daily  . pantoprazole  40 mg Oral Daily  . polyvinyl alcohol  1 drop Both Eyes Daily  . sodium chloride  3 mL Intravenous Q12H  . sodium chloride  3 mL Intravenous Q12H  . tamoxifen  20 mg Oral Daily   Continuous Infusions:   Principal Problem:  *Pleural effusion Active Problems:  Breast cancer  Pure hypercholesterolemia  Coronary artery disease  Tachycardia  Dyspnea  HTN (hypertension)  Ascites  Peritoneal carcinomatosis  Syncopal episodes    Time spent: 25 minutes    Hollice Espy  [facility] Pager [phone]. If 8PM-8AM, please contact night-coverage at [email or web address], password [facility] 01/05/2012, 2:35 PM  LOS: 8 days

## 2012-01-05 NOTE — Consult Note (Signed)
                    [address]            [name] [address]          [phone]      Full consult to follow.   She presented with worsening shortness of breath, abdominal distension, weight loss and was found to have large left and moderate sized right pleural effusions as well as ascites on CT scan. Thoracentesis improved breathing. Cytology shows malignancy and she has adnexal mass and peritoneal carcinomatosis consistent with ovarian cancer. She will require bilateral pleurx catheters to manage the pleural effusions. I can do this tomorrow afternoon but she should remain at [facility] until these are inserted because I can not do this at [facility] this week. I can also insert a porta cath at the same time while sedated in the OR if that is ok with IR. I discussed this with the patient and she is in agreement. Please let me know if this plan needs to change.   Evelene Croon, MD   [phone]

## 2012-01-05 NOTE — Progress Notes (Signed)
Consult Note: Gyn-Onc  [name] [name] 66 y.o. female  CC: No chief complaint on file.   HPI: Patient is seen today in consultation at the request of Dr. Welton Flakes.  Patient is a 66 year old Caucasian female with a past medical history of coronary artery disease status post CABG earlier this year, unfortunately following a few months after CABG 2 of her grafts were occluded, history of breast cancer on chronic tamoxifen therapy who presented to the hospital for evaluation of the above noted complaints. Per patient her shortness of breath and cough have been going on for at least a month if not more. She claims that her shortness of breath is mostly exertional, and recently it has started to get worse and now she cannot even walk to the mailbox without stopping multiple times. She does not have orthopnea or PND. She does not have leg swelling. She claims that her cough is mostly nonproductive in nature. She denies any subjective fever. She claims that over the past few weeks she has been having issues with tachycardia and her Coreg dosing has been adjusted as well. Unfortunately over the weekend her shortness of breath, cough got significantly worse, she got in touch with her cardiologist today who advised her to come to the emergency room for further evaluation and treatment. In the emergency room she was found to have a large left sided pleural effusion on a chest x-ray.  In addition to the shortness of breath she states her several months essentially for the time of her CABG she has had some abdominal discomfort which she has noticed subjective bloating. She felt some of this is related to her bowels. She has noticed progressive early satiety. [name] not really had any significant nausea and vomiting.  She underwent a thoracentesis on November 13. Your revealed malignant cells. The differential considerations for the cytology and the immunophenotype included primary mammary carcinoma in a primary  gynecologic carcinoma. In addition, should a paracentesis on November 18 that revealed malignant cells similar to those seen on her this cytology specimens.   CT imaging revealed:  Findings: Bilateral pleural effusions, left greater than right. Associated airspace consolidations. Heart size within normal limits. Coronary artery calcification.  Unremarkable liver, spleen, pancreas, biliary system, adrenal glands. Symmetric renal enhancement. No hydronephrosis or hydroureter. No bowel obstruction. No CT evidence for colitis. There is a  moderate amount of ascites. Peritoneal carcinomatosis, with the largest conglomerate in the left paracolic gutter, measuring approximately 7.7 x 6.2 cm and displacing the small bowel centrally. No free intraperitoneal air. No lymphadenopathy.  Thin-walled bladder. Uterus not identified. 5.4 cm right adnexal cyst. Enlarged inguinal lymph nodes, measuring up to 13 mm on the left. There is scattered atherosclerotic calcification of the aorta and its branches. No aneurysmal dilatation.  Multilevel degenerative changes without acute osseous finding. Multiple subcutaneous rounded densities are nonspecific and may  reflect sequelae of prior injection sites, however metastases not excluded.  IMPRESSION:  Bilateral pleural effusions and associated consolidations are partially imaged. Peritoneal carcinomatosis and a moderate amount of ascites. 5.4 cm right adnexal cyst is indeterminate. Inguinal lymphadenopathy.  Ultrasound on November 17 revealed: Uterus: Surgically absent.  Right ovary: Complex cystic right adnexal mass measuring 3.1 x 5.7 x 4.5 cm with surrounding rim of suspected ovarian tissue.  Left ovary: Not visualized transabdominally/transvaginally.   Other findings: Moderate pelvic ascites with internal echoes.  IMPRESSION:  5.7 cm complex cystic right ovarian lesion. Given the associated findings on CT, this appearance is worrisome for primary  ovarian neoplasm.    Status post hysterectomy. Left ovary is not discretely visualized. Correlate with surgical history.  We are asked to see her to offer recommendations regarding the treatment of a possible ovarian or intrapelvic serous carcinoma. Her CA 125 is elevated at 392.9. Her CEA is less than 0.5. In addition her CA 27-29 is in the normal range at 35 but is elevated over her baseline one year ago.  Review of Systems She had another paracentesis done today and is feeling better since that fluid was taken off. Previously states that he took off about a liter a half a few days ago and that today she states was less than that. She does feel significant abdominal relief when she has a paracentesis. Her shortness of breath is persistent but somewhat improved. She did walk the halls with a friend today which is more than she's been able to do since she arrived at the hospital. I spent about 15 minutes speaking about her symptoms and the constellation of findings that we have thus far. In addition we spent quite a bit time discussing food preferences and how to replace the protein that she's been losing between the thoracentesis and paracenteses. She scheduled marginal Port-A-Cath placed. She is also having a port catheter placed tomorrow.  She did have genetic testing at the time of her breast cancer diagnosis and she is BRCA negative.   Current Meds:  Facility-Administered Encounter Medications as of 01/05/64  Medication Dose Route Frequency Provider Last Rate Last Dose  . 0.9 %  sodium chloride infusion  250 mL Intravenous PRN Maretta Bees, MD      . acetaminophen (TYLENOL) tablet 650 mg  650 mg Oral Q6H PRN Shanker Levora Dredge, MD       Or  . acetaminophen (TYLENOL) suppository 650 mg  650 mg Rectal Q6H PRN Shanker Levora Dredge, MD      . albuterol (PROVENTIL) (5 MG/ML) 0.5% nebulizer solution 2.5 mg  2.5 mg Nebulization Q2H PRN Shanker Levora Dredge, MD      . ALPRAZolam Prudy Feeler) tablet 0.25 mg  0.25 mg Oral Q8H  PRN Maretta Bees, MD   0.25 mg at 01/05/12 1327  . alum & mag hydroxide-simeth (MAALOX/MYLANTA) 200-200-20 MG/5ML suspension 30 mL  30 mL Oral Q6H PRN Maretta Bees, MD      . aspirin tablet 325 mg  325 mg Oral Daily Maretta Bees, MD   325 mg at 01/05/12 1143  . atorvastatin (LIPITOR) tablet 20 mg  20 mg Oral QPC supper Maretta Bees, MD   20 mg at 01/04/12 1639  . carvedilol (COREG) tablet 25 mg  25 mg Oral BID WC Maretta Bees, MD   25 mg at 01/05/12 0820  . docusate sodium (COLACE) capsule 100 mg  100 mg Oral BID Maretta Bees, MD   100 mg at 01/04/12 0956  . enoxaparin (LOVENOX) injection 40 mg  40 mg Subcutaneous Q24H Maretta Bees, MD   40 mg at 01/05/12 1423  . feeding supplement (ENSURE) pudding 1 Container  1 Container Oral TID BM Penny Pia, MD   1 Container at 01/05/12 1423  . HYDROcodone-acetaminophen (NORCO/VICODIN) 5-325 MG per tablet 1-2 tablet  1-2 tablet Oral Q4H PRN Maretta Bees, MD   2 tablet at 12/30/11 2150  . influenza  inactive virus vaccine (FLUZONE/FLUARIX) injection 0.5 mL  0.5 mL Intramuscular Once Joselyn Arrow, MD      . nitroGLYCERIN (NITROSTAT) SL tablet 0.4  mg  0.4 mg Sublingual Q5 min PRN Maretta Bees, MD      . ondansetron [facility]) tablet 4 mg  4 mg Oral Q6H PRN Maretta Bees, MD       Or  . ondansetron [facility] injection 4 mg  4 mg Intravenous Q6H PRN Maretta Bees, MD   4 mg at 01/01/12 0745  . oxybutynin (DITROPAN-XL) 24 hr tablet 10 mg  10 mg Oral Daily Maretta Bees, MD   10 mg at 01/05/12 1143  . pantoprazole (PROTONIX) EC tablet 40 mg  40 mg Oral Daily Maretta Bees, MD   40 mg at 01/05/12 1144  . polyvinyl alcohol (LIQUIFILM TEARS) 1.4 % ophthalmic solution 1 drop  1 drop Both Eyes Daily Maretta Bees, MD   1 drop at 01/05/12 1142  . sodium chloride 0.9 % injection 3 mL  3 mL Intravenous Q12H Maretta Bees, MD   3 mL at 01/04/12 0955  . sodium chloride 0.9 % injection 3 mL  3 mL Intravenous Q12H  Maretta Bees, MD   3 mL at 01/05/12 1143  . sodium chloride 0.9 % injection 3 mL  3 mL Intravenous PRN Maretta Bees, MD      . tamoxifen (NOLVADEX) tablet 20 mg  20 mg Oral Daily Maretta Bees, MD   20 mg at 01/05/12 1143  . zolpidem (AMBIEN) tablet 5 mg  5 mg Oral QHS PRN Penny Pia, MD   5 mg at 01/04/12 2221   Outpatient Encounter Prescriptions as of 01/05/2012  Medication Sig Dispense Refill  . aspirin 325 MG tablet Take 325 mg by mouth daily.      [name] atorvastatin (LIPITOR) 20 MG tablet Take 20 mg by mouth daily.      . carvedilol (COREG) 25 MG tablet Take 25 mg by mouth 2 (two) times daily.      Tery Sanfilippo Calcium (STOOL SOFTENER PO) Take 2 capsules by mouth every morning.      [name] EPINEPHrine (EPIPEN 2-PAK) 0.3 mg/0.3 mL DEVI Inject 0.3 mg into the muscle.       . nitroGLYCERIN (NITROSTAT) 0.4 MG SL tablet Place 0.4 mg under the tongue every 5 (five) minutes as needed. Chest pain      . omeprazole (PRILOSEC) 20 MG capsule Take 20 mg by mouth daily.      [name] oxybutynin (DITROPAN-XL) 10 MG 24 hr tablet Take 10 mg by mouth daily.      Bertram Gala Glycol-Propyl Glycol 0.4-0.3 % SOLN Place 1 drop into both eyes every morning.       . tamoxifen (NOLVADEX) 20 MG tablet Take 20 mg by mouth daily.      [name] trimethoprim (TRIMPEX) 100 MG tablet Take 100 mg by mouth daily.        Allergy:  Allergies  Allergen Reactions  . Codeine Nausea And Vomiting       . Isosorbide Other (See Comments)    Extreme headaches  . Other Swelling    All Lip moisturizers except Vaseline.  . Phenothiazines Other (See Comments)    Makes her stop breathing.  . Talwin (Pentazocine) Nausea And Vomiting  . Yellow Jacket Venom Anaphylaxis    Social Hx:   History   Social History  . Marital Status: Married    Spouse Name: N/A    Number of Children: 1  . Years of Education: N/A   Occupational History  . Web designer and ESL coordinator [employer]   Social History Main Topics  .  Smoking status: Former Smoker -- 0.1 packs/day for 15 years    Types: Cigarettes    Quit date: 02/15/2001  . Smokeless tobacco: Never Used  . Alcohol Use: 4.2 oz/week    7 Shots of liquor per week     Comment: 12/28/11 "1 gin & tonic q hs"  . Drug Use: No  . Sexually Active: Not Currently   Other Topics Concern  . Not on file   Social History Narrative   Lives with husband and dog    Past Surgical Hx:  Past Surgical History  Procedure Date  . Breast lumpectomy 04/2009    left  . Cesarean section 1973; 1976  . Muscle release 1960    L neck and chest.; "when I was 13; pneumonia settled in my left neck"  . Coronary artery bypass graft 08/18/2011    Procedure: CORONARY ARTERY BYPASS GRAFTING (CABG);  Surgeon: Alleen Borne, MD;  Location: [facility] OR;  Service: Open Heart Surgery;  Laterality: N/A;  Coronary Artery Bypass Graft times five utilizing the left internal mammary artery and the left greater saphenous vein harvested endoscopically.  . Abdominal hysterectomy 1976  . Appendectomy 1976    Past Medical Hx:  Past Medical History  Diagnosis Date  . Interstitial cystitis     on chronic antibiotics  . Hyperlipidemia   . Frequent UTI     on prophylaxis  . 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 CAD (coronary artery disease)     a. s/p CABG 7/13;   b.  LHC 12/01/11:  pLAD 70%, mLAD 40%, CFX 40-50% prior to takeoff of the OM2, oRCA occluded, mid vessel filled via R->R collaterals and distal vessel filled by L->R collaterals, S-OM1/OM2 (small and diffusely dz) with mid 90% stenosis, 90% at OM1 anastomotic site, continuation of OM2 occluded, S-Dx patent, S-PDA occluded, L-LAD ok, EF 55-65%  => Med Rx rec.  [name] Hx of echocardiogram     Echo 5/13: EF 60-65%, grade 2 diastolic dysfunction  . Hypercholesterolemia   . Pleural effusion 12/28/11  . GERD (gastroesophageal reflux disease)   . Arthritis     "just a little; lower back" (12/28/11)  . Gout attack 08/2011    related to "stress post OHS"  .  Depression   . Breast cancer     "left"; on Tamoxifen  . S/P thoracentesis 12/28/11    "for pleural effusion" (12/28/2011)    Family Hx:  Family History  Problem Relation Age of Onset  . Cancer Mother 75    breast cancer  . Heart disease Father 58    MI at 69, CABG in 63's  . Hepatitis Father     C from blood transfusion  . Heart disease Brother     CABG in 89's  . Heart disease Paternal Aunt   . Heart disease Paternal Uncle   . Heart disease Paternal Grandfather   . Diabetes Neg Hx     Vitals:  There were no vitals taken for this visit. Please refer to inpatient nursing records for vital signs  Physical Exam: Well-nourished well-appearing female in no acute distress.  HEENT: Atraumatic. Temporal wasting equal bilaterally.  Neck: Supple, no lymphadenopathy no thyromegaly.  Chest:  Well-healed thoracotomy incision.  Abdomen: Soft, distended. Positive fluid wave. There is no rebound or guarding. There are no obvious  abdominal masses. There is no incisional hernia.  Extremities: No edema. Warm to touch, 2+ distal pulses.  Assessment/Plan: 66 year old with personal history of breast cancer and 2011 status post lumpectomy and tamoxifen. She now comes in with pleural effusions and ascites. Cytology is consistent with either a gynecologic primary or a breast cancer recurrence. CT imaging is consistent with diffuse carcinomatosis.  I met face with the patient and her husband who came in little bit later for approximately 45 minutes. The constellation of symptoms together with her performance status which is 1-2, her recent CABG in July 2013 with occlusion of 2 vein grafts I feel that she would benefit most prominently from neoadjuvant chemotherapy approach. I believe that Dr. Welton Flakes and I can agree upon a regimen that will cover both breast cancer as well as a gynecologic malignancy. She scheduled for Port-A-Cath tomorrow the patient and states she is to be transferred to [facility]  on Friday for chemotherapy if that was the decision that was made.  I discussed with her that we would proceed probably with 3 cycles of neoadjuvant chemotherapy. We will follow her CA 125 to see if she is having response. In addition prior to considering surgery would have her come see [place] in clinic with a CT scan to evaluate  interabdominal response. She to be cleared by her cardiologist Dr. Stephannie Peters with regards to the ability for her to undergo general anesthesia and radical debulking procedure. In addition, this will allow her to have improvement in her performance status as well as a protein and nutritional parameters. She is very comfortable with this treatment plan. Her questions as well as those of her husband were elicited in answer to their satisfaction. They were given the contact information. Will be happy for you  to contact us with additional questions.  Thank you for ability to consult on this very lovely patient.  Mava Suares A., MD 01/05/2012, 4:19 PM

## 2012-01-05 NOTE — Procedures (Signed)
Patient seen and examined, agree with above note.  I dictated the care and orders written for this patient under my direction.  Derita Michelsen, M.D. [phone] 

## 2012-01-05 NOTE — Procedures (Signed)
Successful US guided paracentesis from RLQ.  Yielded of blood tinged fluid.  No immediate complications.  Pt tolerated well.   Specimen was not sent for labs.  Brayton El PA-C 01/05/2012 10:41 AM

## 2012-01-05 NOTE — Progress Notes (Signed)
Have been following chart D/W Dr. Welton Flakes this am. OK with [place] for TCTS/Dr. Laneta Simmers to do Pleurx and Port at same time, will cancel IR orders. Will bring down today for repeat therapeutic paracentesis.  Barbee Shropshire [facility] 01/05/2012 9:14 AM

## 2012-01-06 ENCOUNTER — Inpatient Hospital Stay (HOSPITAL_COMMUNITY): Payer: BC Managed Care – PPO

## 2012-01-06 ENCOUNTER — Inpatient Hospital Stay (HOSPITAL_COMMUNITY): Payer: BC Managed Care – PPO | Admitting: Anesthesiology

## 2012-01-06 ENCOUNTER — Encounter (HOSPITAL_COMMUNITY)
Admission: EM | Disposition: A | Discharge status: Home / Self Care | Payer: Self-pay | Source: Home / Self Care | Attending: Family Medicine

## 2012-01-06 ENCOUNTER — Encounter (HOSPITAL_COMMUNITY): Payer: Self-pay | Admitting: Anesthesiology

## 2012-01-06 DIAGNOSIS — J910 Malignant pleural effusion: Secondary | ICD-10-CM

## 2012-01-06 DIAGNOSIS — C569 Malignant neoplasm of unspecified ovary: Secondary | ICD-10-CM | POA: Diagnosis present

## 2012-01-06 DIAGNOSIS — C50919 Malignant neoplasm of unspecified site of unspecified female breast: Secondary | ICD-10-CM

## 2012-01-06 HISTORY — PX: PORTACATH PLACEMENT: SHX2246

## 2012-01-06 HISTORY — PX: CHEST TUBE INSERTION: SHX231

## 2012-01-06 LAB — BODY FLUID CULTURE: Gram Stain: NONE SEEN

## 2012-01-06 SURGERY — INSERTION, TUNNELED CENTRAL VENOUS DEVICE, WITH PORT
Anesthesia: Monitor Anesthesia Care | Site: Chest | Laterality: Left | Wound class: Clean

## 2012-01-06 MED ORDER — HEPARIN SODIUM (PORCINE) 1000 UNIT/ML IJ SOLN
INTRAMUSCULAR | Status: AC
  Filled 2012-01-06: qty 1

## 2012-01-06 MED ORDER — LIDOCAINE-EPINEPHRINE (PF) 1 %-1:200000 IJ SOLN
INTRAMUSCULAR | Status: DC | PRN
  Administered 2012-01-06: 28 mL

## 2012-01-06 MED ORDER — HEPARIN SODIUM (PORCINE) 1000 UNIT/ML DIALYSIS
INTRAMUSCULAR | Status: DC | PRN
  Administered 2012-01-06: 3000 [IU]

## 2012-01-06 MED ORDER — SODIUM CHLORIDE 0.9 % IR SOLN
Status: DC | PRN
  Administered 2012-01-06: 16:00:00

## 2012-01-06 MED ORDER — CEFAZOLIN SODIUM 1-5 GM-% IV SOLN
INTRAVENOUS | Status: DC | PRN
  Administered 2012-01-06: 1 g via INTRAVENOUS

## 2012-01-06 MED ORDER — OXYCODONE HCL 5 MG PO TABS: 5.0000 mg | ORAL_TABLET | Freq: Once | ORAL | Status: DC | PRN

## 2012-01-06 MED ORDER — ONDANSETRON HCL 4 MG/2ML IJ SOLN: 4.0000 mg | Freq: Once | INTRAMUSCULAR | Status: DC | PRN

## 2012-01-06 MED ORDER — CEFAZOLIN SODIUM 1-5 GM-% IV SOLN
1.0000 g | Freq: Three times a day (TID) | INTRAVENOUS | Status: DC
  Administered 2012-01-06 – 2012-01-07 (×2): 1 g via INTRAVENOUS
  Filled 2012-01-06 (×4): qty 50

## 2012-01-06 MED ORDER — MIDAZOLAM HCL 5 MG/5ML IJ SOLN
INTRAMUSCULAR | Status: DC | PRN
  Administered 2012-01-06: 2 mg via INTRAVENOUS

## 2012-01-06 MED ORDER — LACTATED RINGERS IV SOLN
INTRAVENOUS | Status: DC
  Administered 2012-01-06 – 2012-01-08 (×3): via INTRAVENOUS

## 2012-01-06 MED ORDER — CEFAZOLIN SODIUM 1-5 GM-% IV SOLN
INTRAVENOUS | Status: AC
  Filled 2012-01-06: qty 50

## 2012-01-06 MED ORDER — LIDOCAINE-EPINEPHRINE (PF) 1 %-1:200000 IJ SOLN
INTRAMUSCULAR | Status: AC
  Filled 2012-01-06: qty 10

## 2012-01-06 MED ORDER — OXYCODONE HCL 5 MG/5ML PO SOLN: 5.0000 mg | Freq: Once | ORAL | Status: DC | PRN

## 2012-01-06 MED ORDER — PROPOFOL INFUSION 10 MG/ML OPTIME
INTRAVENOUS | Status: DC | PRN
  Administered 2012-01-06: 50 ug/kg/min via INTRAVENOUS

## 2012-01-06 MED ORDER — CARVEDILOL 3.125 MG PO TABS
3.1250 mg | ORAL_TABLET | Freq: Two times a day (BID) | ORAL | Status: DC
  Administered 2012-01-07 – 2012-01-08 (×3): 3.125 mg via ORAL
  Filled 2012-01-06 (×7): qty 1

## 2012-01-06 MED ORDER — MEPERIDINE HCL 25 MG/ML IJ SOLN: 6.2500 mg | INTRAMUSCULAR | Status: DC | PRN

## 2012-01-06 MED ORDER — FENTANYL CITRATE 0.05 MG/ML IJ SOLN
INTRAMUSCULAR | Status: DC | PRN
  Administered 2012-01-06 (×5): 25 ug via INTRAVENOUS

## 2012-01-06 MED ORDER — HYDROMORPHONE HCL PF 1 MG/ML IJ SOLN: 0.2500 mg | INTRAMUSCULAR | Status: DC | PRN

## 2012-01-06 SURGICAL SUPPLY — 44 items
ADH SKN CLS APL DERMABOND .7 (GAUZE/BANDAGES/DRESSINGS) ×2
BAG DECANTER FOR FLEXI CONT (MISCELLANEOUS) ×3 IMPLANT
BLADE SURG 15 STRL LF DISP TIS (BLADE) ×2 IMPLANT
BLADE SURG 15 STRL SS (BLADE) ×3
BRUSH SCRUB EZ PLAIN DRY (MISCELLANEOUS) ×3 IMPLANT
CANISTER SUCTION 2500CC (MISCELLANEOUS) ×3 IMPLANT
CLOTH BEACON ORANGE TIMEOUT ST (SAFETY) ×3 IMPLANT
COVER SURGICAL LIGHT HANDLE (MISCELLANEOUS) ×3 IMPLANT
DERMABOND ADVANCED (GAUZE/BANDAGES/DRESSINGS) ×1
DERMABOND ADVANCED .7 DNX12 (GAUZE/BANDAGES/DRESSINGS) ×2 IMPLANT
DRAPE C-ARM 42X72 X-RAY (DRAPES) ×3 IMPLANT
DRAPE CHEST BREAST 15X10 FENES (DRAPES) ×3 IMPLANT
DRAPE INCISE IOBAN 66X45 STRL (DRAPES) ×3 IMPLANT
DRAPE LAPAROSCOPIC ABDOMINAL (DRAPES) ×3 IMPLANT
ELECT CAUTERY BLADE 6.4 (BLADE) ×3 IMPLANT
ELECT REM PT RETURN 9FT ADLT (ELECTROSURGICAL) ×3
ELECTRODE REM PT RTRN 9FT ADLT (ELECTROSURGICAL) ×2 IMPLANT
GLOVE EUDERMIC 7 POWDERFREE (GLOVE) ×3 IMPLANT
GOWN PREVENTION PLUS XLARGE (GOWN DISPOSABLE) ×3 IMPLANT
GOWN STRL NON-REIN LRG LVL3 (GOWN DISPOSABLE) ×3 IMPLANT
KIT BASIN OR (CUSTOM PROCEDURE TRAY) ×4 IMPLANT
KIT PLEURX DRAIN CATH 1000ML (MISCELLANEOUS) ×5 IMPLANT
KIT PLEURX DRAIN CATH 15.5FR (DRAIN) ×4 IMPLANT
KIT PORT POWER 9.6FR MRI PREA (Catheter) ×1 IMPLANT
KIT PORT POWER ISP 8FR (Catheter) IMPLANT
KIT POWER CATH 8FR (Catheter) IMPLANT
KIT ROOM TURNOVER OR (KITS) ×3 IMPLANT
NEEDLE 22X1 1/2 (OR ONLY) (NEEDLE) ×3 IMPLANT
NS IRRIG 1000ML POUR BTL (IV SOLUTION) ×3 IMPLANT
PACK GENERAL/GYN (CUSTOM PROCEDURE TRAY) ×3 IMPLANT
PAD ARMBOARD 7.5X6 YLW CONV (MISCELLANEOUS) ×6 IMPLANT
SET DRAINAGE LINE (MISCELLANEOUS) IMPLANT
SPONGE GAUZE 4X4 12PLY (GAUZE/BANDAGES/DRESSINGS) IMPLANT
SUT ETHILON 3 0 PS 1 (SUTURE) ×6 IMPLANT
SUT SILK 2 0 SH (SUTURE) ×4 IMPLANT
SUT VIC AB 3-0 SH 27 (SUTURE) ×3
SUT VIC AB 3-0 SH 27X BRD (SUTURE) ×2 IMPLANT
SUT VIC AB 3-0 X1 27 (SUTURE) ×3 IMPLANT
SUT VIC AB 4-0 PS2 27 (SUTURE) ×3 IMPLANT
SYR 20CC LL (SYRINGE) ×3 IMPLANT
SYR CONTROL 10ML LL (SYRINGE) ×3 IMPLANT
TOWEL OR 17X24 6PK STRL BLUE (TOWEL DISPOSABLE) ×3 IMPLANT
TOWEL OR 17X26 10 PK STRL BLUE (TOWEL DISPOSABLE) ×3 IMPLANT
WATER STERILE IRR 1000ML POUR (IV SOLUTION) ×3 IMPLANT

## 2012-01-06 NOTE — Transfer of Care (Signed)
Immediate Anesthesia Transfer of Care Note  Patient: [name] [name]  Procedure(s) Performed: Procedure(s) (LRB) with comments: INSERTION PORT-A-CATH (Left) INSERTION PLEURAL DRAINAGE CATHETER (Bilateral)  Patient Location: PACU  Anesthesia Type:MAC  Level of Consciousness: awake, alert  and oriented  Airway & Oxygen Therapy: Patient Spontanous Breathing  Post-op Assessment: Report given to PACU RN and Post -op Vital signs reviewed and stable  Post vital signs: Reviewed and stable  Complications: No apparent anesthesia complications

## 2012-01-06 NOTE — Progress Notes (Signed)
Physical Therapy Treatment Patient Details Name: [name] [name] MRN: [id number] DOB: 08-22-1945 Today's Date: 01/06/2012 Time: 1100-1110 PT Time Calculation (min): 10 min  PT Assessment / Plan / Recommendation Comments on Treatment Session  Treatment limited today due to pt becoming dizzy and needing to sit down.  She was not up to continuing due to going for a surgical intervention soon.    Follow Up Recommendations  Home health PT;Supervision - Intermittent     Does the patient have the potential to tolerate intense rehabilitation     Barriers to Discharge        Equipment Recommendations  None recommended by PT    Recommendations for Other Services    Frequency Min 3X/week   Plan Frequency remains appropriate;Discharge plan needs to be updated    Precautions / Restrictions Precautions Precautions: Fall Restrictions Weight Bearing Restrictions: No   Pertinent Vitals/Pain EHR 80's SaO2 at 90% on RA after return from limited activity.  O2 replaced with immediate increase in O2 to 94%    Mobility  Bed Mobility Bed Mobility: Not assessed Transfers Transfers: Sit to Stand;Stand to Sit Sit to Stand: 5: Supervision;With upper extremity assist;From chair/3-in-1 Stand to Sit: 4: Min guard;With upper extremity assist;To chair/3-in-1 Details for Transfer Assistance: safe technique Ambulation/Gait Ambulation/Gait Assistance: 4: Min guard Ambulation Distance (Feet): 25 Feet Assistive device: Rolling walker Ambulation/Gait Assistance Details: mildly unsteady and limited due to pt becoming dizzy mid walk, stating that it felt like the other day when she was "passing out" Gait Pattern: Step-through pattern;Decreased step length - right;Decreased step length - left;Decreased stride length Gait velocity: decr Wheelchair Mobility Wheelchair Mobility: No    Exercises     PT Diagnosis:    PT Problem List:   PT Treatment Interventions:     PT Goals Acute Rehab PT  Goals Time For Goal Achievement: 01/07/12 Potential to Achieve Goals: Good PT Goal: Ambulate - Progress: Not met  Visit Information  Last PT Received On: 01/06/12 Assistance Needed: +1    Subjective Data  Subjective: I'm not feeling well, I guess we can walk.   Cognition  Overall Cognitive Status: Appears within functional limits for tasks assessed/performed Arousal/Alertness: Awake/alert Orientation Level: Appears intact for tasks assessed Behavior During Session: [facility] for tasks performed    Balance  Balance Balance Assessed: Yes  End of Session PT - End of Session Activity Tolerance: Patient limited by fatigue (and dizziness) Patient left: in chair;with call bell/phone within reach;with family/visitor present   GP     Ashutosh Dieguez, Eliseo Gum 01/06/2012, 11:34 AM 01/06/2012  Chillum Bing, PT [phone] (pager)

## 2012-01-06 NOTE — Progress Notes (Signed)
Chaplain visited patient in response to an Order Requisition. Patient was awake and responsive but appeared to be in pain and exhausted. Family member was with patient during Chaplain's visit. Patient and family member appeared to be anxious waiting for patient to be picked up for surgery. Chaplain shared words of encouragement and hope with both family and patient. Chaplain also served as a Print production planner between patient, family and Engineer, civil (consulting). Both patient and family expressed their appreciation for Chaplain's visit and updating them on time of surgery after consulting with nurse.

## 2012-01-06 NOTE — Progress Notes (Signed)
[facility] PROGRESS NOTE  [name] [name] ZOX:[id number] DOB: 06/05/1945 DOA: 12/28/2011 PCP: Lavonda Jumbo, MD Brief History: - Patient is a 67 y/o CF with history of breast cancer 3 years ago treated with lumpectomy and radiation therapy.  Presented with pleural effusions that were secondary to suspected new malignancy (adnexal vs breast- most likely adnexal given mass and peritoneal carcinomatosis).  Also found to have ascites which has been tapped and sent to cytology.  - Ct of abdomen and pelvis showing Peritoneal carcinomatosis, inguinal lymphadenopathy, nonspecific anterior subcutaneous nodules, and 5.4 cm right adnexal cyst interpreted as indeterminate. - Paracentesis with cytology on 01/02/12.  Reaccumulation of ascites & 2nd paracentesis done today 11/20 - Transvaginal ultrasound complete 01/02/12 - Antibiotics were initiated during initial evaluation for suspected pneumonia or pleural effusions 2ary to infectious etiology.  This is no longer the case and effusions likely 2ary to malignancy.  Therefore will d/c antibiotics at this juncture.  Patient afebrile, no cough, non toxic, and wbc within normal limits. CT surgeon (same surgeon Dr. Laneta Simmers that performed patient's CABG)consulted who will place bilat pleurex catheters today.  Once this is complete, pt will be transferred to [facility] for chemo.  Assessment/Plan:  1. Suspected Metastatic Ovarian cancer- Seen by Gyn Onc & Onc.  Plan is for placement of port today (along with further thoracentesis) & then transfer to [facility] tomorrow for starting of chemotherapy 1.  2. Pleural effusion -Recurrent.  For bilat pleurex catheter drainage during port placement today by CT surgery, 3. Tachycardia: Resolved, CT angiogram negative.  Heart rate WNL's and last recorded 83. 4. Dyspnea: resolved after thoracentesis and was 2ary to pleural effusions (BL) likely 2ary to malignancy 5. CAD s/p CABG: Continue aspirin, atorvastatin 6. H/o breast  cancer: Per oncologist. 7. Htn: continue current regimen will continue to monitor. 8. Syncopal episode:  (occured 01/03/12 while doing physical therapy) - Etiology uncertain. No red flags reported on telemetry monitoring - Suspect orthostatic hypotension - No seizure like activity reported.  Will defer EEG - order carotid doppler 01/04/12 - Patient had reportedly hypoxia when evaluated during syncopal episode, which  May have been from pleural effusions   Code Status: full Family Communication: None at bedside Disposition Plan: Tx to [facility] tomorrow for chemo   Consultants:  Pulmonary : Dr. Craige Cotta  Oncology: Dr. Welton Flakes  GYN oncologist  CT surgeon: Dr. Laneta Simmers  Procedures:  Thoracentesis 12/28/11 & 11/21-by pleurex catheter  Paracentesis 01/02/12 & 11/20  Antibiotics:  levaquin d/c 01/02/12  Trimethoprim 01/02/12  HPI/Subjective: Seen post paracentesis.  Tired, breathing a bit more labored today because of pleural effusions, no pain.    Objective: Filed Vitals:   01/05/12 2106 01/05/12 2122 01/06/12 0511 01/06/12 0959  BP: 87/53 96/58 100/68 98/56  Pulse: 81  92   Temp: 98.3 F (36.8 C)  98.3 F (36.8 C)   TempSrc: Oral  Oral   Resp: 15  15   Height:      Weight:   63.277 kg (139 lb 8 oz)   SpO2: 90%  93%     Intake/Output Summary (Last 24 hours) at 01/06/12 1500 Last data filed at 01/06/12 1000  Gross per 24 hour  Intake    180 ml  Output      0 ml  Net    180 ml   Filed Weights   01/04/12 0500 01/05/12 0500 01/06/12 0511  Weight: 62.415 kg (137 lb 9.6 oz) 61.825 kg (136 lb 4.8 oz) 63.277 kg (139 lb 8 oz)  Exam:   General:  Fatigue, Alert and Oriented x 3  Cardiovascular: RRR, No MRG  Respiratory: CTA BL, decreased breath sounds at bases worse today,   Abdomen: soft, NT, nodules flanking umbilicus  Extremities: No clubbing or cyanosis or edema  Data Reviewed: Basic Metabolic Panel:  Lab 01/04/12 1610 01/01/12 0535  NA -- 133*  K -- 3.8   CL -- 99  CO2 -- 23  GLUCOSE -- 113*  BUN -- 13  CREATININE 0.72 0.69  CALCIUM -- 8.3*  MG -- --  PHOS -- --   CBC:  Lab 01/01/12 0535  WBC 8.6  NEUTROABS --  HGB 9.5*  HCT 29.3*  MCV 81.4  PLT 475*   BNP (last 3 results)  Basename 12/28/11 1109  PROBNP 828.2*     Recent Results (from the past 240 hour(s))  AFB CULTURE WITH SMEAR     Status: Normal (Preliminary result)   Collection Time   12/28/11  3:30 PM      Component Value Range Status Comment   Specimen Description PLEURAL FLUID LEFT   Final    Special Requests   Final    ACID FAST SMEAR NO ACID FAST BACILLI SEEN   Final    Culture     Final    Value: CULTURE WILL BE EXAMINED FOR 6 WEEKS BEFORE ISSUING A FINAL REPORT   Report Status PENDING   Incomplete   BODY FLUID CULTURE     Status: Normal   Collection Time   12/28/11  3:30 PM      Component Value Range Status Comment   Specimen Description PLEURAL FLUID LEFT   Final    Special Requests   Final    Gram Stain     Final    Value: RARE WBC PRESENT, PREDOMINANTLY PMN     NO ORGANISMS SEEN   Culture NO GROWTH 3 DAYS   Final    Report Status 12/31/2011 FINAL   Final   FUNGUS CULTURE W SMEAR     Status: Normal (Preliminary result)   Collection Time   12/28/11  3:30 PM      Component Value Range Status Comment   Specimen Description PLEURAL FLUID LEFT   Final    Special Requests   Final    Fungal Smear NO YEAST OR FUNGAL ELEMENTS SEEN   Final    Culture CULTURE IN PROGRESS FOR FOUR WEEKS   Final    Report Status PENDING   Incomplete   BODY FLUID CULTURE     Status: Normal   Collection Time   12/29/11 11:40 AM      Component Value Range Status Comment   Specimen Description PLEURAL FLUID RIGHT   Final    Special Requests   Final    Gram Stain     Final    Value: NO WBC SEEN     NO ORGANISMS SEEN   Culture NO GROWTH 3 DAYS   Final    Report Status 01/02/2012 FINAL   Final   AFB CULTURE WITH SMEAR     Status: Normal (Preliminary  result)   Collection Time   12/29/11 11:40 AM      Component Value Range Status Comment   Specimen Description PLEURAL FLUID RIGHT   Final    Special Requests   Final    ACID FAST SMEAR NO ACID FAST BACILLI SEEN   Final    Culture     Final  Value: CULTURE WILL BE EXAMINED FOR 6 WEEKS BEFORE ISSUING A FINAL REPORT   Report Status PENDING   Incomplete   FUNGUS CULTURE W SMEAR     Status: Normal (Preliminary result)   Collection Time   12/29/11 11:40 AM      Component Value Range Status Comment   Specimen Description PLEURAL FLUID RIGHT   Final    Special Requests   Final    Fungal Smear NO YEAST OR FUNGAL ELEMENTS SEEN   Final    Culture CULTURE IN PROGRESS FOR FOUR WEEKS   Final    Report Status PENDING   Incomplete   BODY FLUID CULTURE     Status: Normal   Collection Time   01/02/12 10:29 AM      Component Value Range Status Comment   Specimen Description PERITONEAL   Final    Special Requests NONE   Final    Gram Stain     Final    Value: NO WBC SEEN     NO ORGANISMS SEEN   Culture NO GROWTH 3 DAYS   Final    Report Status 01/06/2012 FINAL   Final   SURGICAL PCR SCREEN     Status: Normal   Collection Time   01/05/12  7:17 PM      Component Value Range Status Comment   MRSA, PCR NEGATIVE  NEGATIVE Final    Staphylococcus aureus NEGATIVE  NEGATIVE Final      Studies: Dg Chest 2 View  01/03/2012  IMPRESSION: Continued bilateral pleural effusions.  The moderate effusion on the left shows some further increase since 12/31/2011.   Original Report Authenticated By: Erskine Speed, M.D.     Scheduled Meds:    . aspirin  325 mg Oral Daily  . atorvastatin  20 mg Oral QPC supper  . carvedilol  25 mg Oral BID WC  . docusate sodium  100 mg Oral BID  . enoxaparin (LOVENOX) injection  40 mg Subcutaneous Q24H  . feeding supplement  1 Container Oral TID BM  . oxybutynin  10 mg Oral Daily  . pantoprazole  40 mg Oral Daily  . polyvinyl alcohol  1 drop Both Eyes Daily  .  sodium chloride  3 mL Intravenous Q12H  . sodium chloride  3 mL Intravenous Q12H  . tamoxifen  20 mg Oral Daily   Continuous Infusions:    . lactated ringers 50 mL/hr at 01/06/12 1420    Principal Problem:  *Pleural effusion Active Problems:  Breast cancer  Pure hypercholesterolemia  Coronary artery disease  Tachycardia  Dyspnea  HTN (hypertension)  Ascites  Peritoneal carcinomatosis  Syncopal episodes    Time spent: 20 minutes    Hollice Espy  [facility] Pager [phone]. If 8PM-8AM, please contact night-coverage at [email or web address], password [facility] 01/06/2012, 3:00 PM  LOS: 9 days

## 2012-01-06 NOTE — Anesthesia Postprocedure Evaluation (Signed)
Anesthesia Post Note  Patient: [name] [name]  Procedure(s) Performed: Procedure(s) (LRB): INSERTION PORT-A-CATH (Left) INSERTION PLEURAL DRAINAGE CATHETER (Bilateral)  Anesthesia type: MAC  Patient location: PACU  Post pain: Pain level controlled  Post assessment: Patient's Cardiovascular Status Stable  Last Vitals:  Filed Vitals:   01/06/12 0959  BP: 98/56  Pulse:   Temp:   Resp:     Post vital signs: Reviewed and stable  Level of consciousness: sedated  Complications: No apparent anesthesia complications

## 2012-01-06 NOTE — Anesthesia Preprocedure Evaluation (Signed)
Anesthesia Evaluation  Patient identified by MRN, date of birth, ID band Patient awake    Reviewed: Allergy & Precautions, H&P , NPO status , Patient's Chart, lab work & pertinent test results  Airway Mallampati: I TM Distance: >3 FB Neck ROM: Full    Dental   Pulmonary shortness of breath and with exertion,          Cardiovascular hypertension, Pt. on medications + angina + CAD, + Past MI and + CABG     Neuro/Psych    GI/Hepatic GERD-  Controlled,  Endo/Other    Renal/GU      Musculoskeletal   Abdominal   Peds  Hematology   Anesthesia Other Findings   Reproductive/Obstetrics                           Anesthesia Physical Anesthesia Plan  ASA: IV  Anesthesia Plan: MAC   Post-op Pain Management:    Induction: Intravenous  Airway Management Planned: Simple Face Mask  Additional Equipment:   Intra-op Plan:   Post-operative Plan:   Informed Consent: I have reviewed the patients History and Physical, chart, labs and discussed the procedure including the risks, benefits and alternatives for the proposed anesthesia with the patient or authorized representative who has indicated his/her understanding and acceptance.     Plan Discussed with: CRNA and Surgeon  Anesthesia Plan Comments:         Anesthesia Quick Evaluation

## 2012-01-06 NOTE — Progress Notes (Signed)
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   1. S/P left breast lumpectomy in March 2011 after she had a screen detected 1.0 cm ER+, PR+, intermediate grade DCIS. Patient had 3 sentinel biopsied all of them were negative for metastatic disease.   2. S/P radiation therapy administered between June 05, 2009 - Jul 02, 2009   3. Tamoxifen 20 mg since August 2011. Last seen at the Cancer [facility] in 09/30/2011.    Subjective  Events since 11/20  Noted. Awaiting to proceed with Pleurx catheter placement in conjunction with  Port-A-Cath.  Shotness of breath better controlled with bilateral thoracentesis, but patient feels fluid returning. Abdominal discomfort improved after second paracentesis performed on 11/20 by IR.Eating small meals throughout the day. Abdominal bloating and distention.  No other complaints verbalized.    MEDICATIONS:    . aspirin  325 mg Oral Daily  . atorvastatin  20 mg Oral QPC supper  . carvedilol  25 mg Oral BID WC  . docusate sodium  100 mg Oral BID  . enoxaparin (LOVENOX) injection  40 mg Subcutaneous Q24H  . feeding supplement  1 Container Oral TID BM  . oxybutynin  10 mg Oral Daily  . pantoprazole  40 mg Oral Daily  . polyvinyl alcohol  1 drop Both Eyes Daily  . sodium chloride  3 mL Intravenous Q12H  . sodium chloride  3 mL Intravenous Q12H  . tamoxifen  20 mg Oral Daily    ALLERGIES:  is allergic to codeine; isosorbide; other; phenothiazines; talwin; and yellow jacket venom.   PHYSICAL EXAMINATION:   Filed Vitals:   01/06/12 0511  BP: 100/68  Pulse: 92  Temp: 98.3 F (36.8 C)  Resp: 15   Filed Weights   01/04/12 0500 01/05/12 0500 01/06/12 0511  Weight: 137 lb 9.6 oz (62.415 kg) 136 lb 4.8 oz (61.825 kg) 139 lb 8 oz (63.277 kg)     75 -year-old  in no acute distress A. and O. x3 General well-developed, ill appearing.    HEENT: Normocephalic, atraumatic,temporal wasting noted .PERRLA. Oral cavity without thrush or lesions. Neck supple. no thyromegaly, no cervical or supraclavicular adenopathy  Lungs decreased breath sounds bi-basilarly, L>R. No wheezing, rhonchi or rales. Cardiac: regular rate and rhythm , no murmur , rubs or gallops. Occasional ectopic beat. Well healed sternotomy scar.  Abdomen distended, fluid wave present secondary to ascites, tender to palpation on the LLQ , positive [facility]. [place]a Mccreedy nodes. bowel sounds x4.  Extremities no clubbing cyanosis or edema. No bruising or petechial rash Neuro: non focal  LABORATORY/RADIOLOGY DATA:   Lab 01/01/12 0535  WBC 8.6  HGB 9.5*  HCT 29.3*  PLT 475*  MCV 81.4  MCH 26.4  MCHC 32.4  RDW 14.3  LYMPHSABS --  MONOABS --  EOSABS --  BASOSABS --  BANDABS --    CMP    Lab 01/04/12 0506 01/01/12 0535  NA -- 133*  K -- 3.8  CL -- 99  CO2 -- 23  GLUCOSE -- 113*  BUN -- 13  CREATININE 0.72 0.69  CALCIUM -- 8.3*  MG -- --  AST -- --  ALT -- --  ALKPHOS -- --  BILITOT -- --        Component Value Date/Time   BILITOT 0.2 12/17/2011 1144     Urinalysis    Component Value Date/Time   COLORURINE  YELLOW 08/16/2011 1557   APPEARANCEUR CLEAR 08/16/2011 1557   LABSPEC 1.021 08/16/2011 1557   PHURINE 6.0 08/16/2011 1557   GLUCOSEU NEGATIVE 08/16/2011 1557   HGBUR NEGATIVE 08/16/2011 1557   BILIRUBINUR NEGATIVE 08/16/2011 1557   BILIRUBINUR neg 07/29/2011 0937   KETONESUR NEGATIVE 08/16/2011 1557   PROTEINUR NEGATIVE 08/16/2011 1557   UROBILINOGEN 0.2 08/16/2011 1557   UROBILINOGEN negative 07/29/2011 0937   NITRITE NEGATIVE 08/16/2011 1557   NITRITE neg 07/29/2011 0937   LEUKOCYTESUR SMALL* 08/16/2011 1557      Radiology Studies:  US Paracentesis  01/05/2012  Comparison:  Previous paracentesis  An ultrasound guided paracentesis was thoroughly discussed with the patient and questions answered.  The benefits, risks, alternatives and complications were  also discussed.  The patient understands and wishes to proceed with the procedure.  Written consent was obtained.  Ultrasound was performed to localize and mark an adequate pocket of fluid in the right lower quadrant of the abdomen.  The area was then prepped and draped in the normal sterile fashion.  1% Lidocaine was used for local anesthesia.  Under ultrasound guidance a 19 gauge Yueh catheter was introduced.  Paracentesis was performed.  The catheter was removed and a dressing applied.  Complications:  None immediate  Findings:  A total of approximately 800 ml of blood tinged fluid was removed.  A fluid sample was not sent for laboratory analysis.  IMPRESSION: Successful ultrasound guided paracentesis yielding 800 ml of ascites.  Read by Brayton El PA-C   Original Report Authenticated By: Richarda Overlie, M.D.    US Paracentesis   01/02/2012 Comparison: CT imaging of the abdomen and pelvis An ultrasound guided paracentesis was thoroughly discussed with the patient and questions answered. The benefits, risks, alternatives and complications were also discussed. The patient understands and wishes to proceed with the procedure. Written consent was obtained. Ultrasound was performed to localize and mark an adequate pocket of fluid in the right lower quadrant of the abdomen. The area was then prepped and draped in the normal sterile fashion. 1% Lidocaine was used for local anesthesia. Under ultrasound guidance a 19 gauge Yueh catheter was introduced. Paracentesis was performed. The catheter was removed and a dressing applied. Complications: None immediate Findings: A total of approximately 1.1 liters of bloody serous fluid was removed. A fluid sample was sent for laboratory analysis. IMPRESSION: Successful ultrasound guided paracentesis yielding 1.1 liters of ascites. Read by: Anselm Pancoast, P.A.-C Original Report Authenticated By: Richarda Overlie, M.D.    US Transvaginal Non-ob   11/17/2013omparison: None  Findings: Uterus: Surgically absent. Right ovary: Complex cystic right adnexal mass measuring 3.1 x 5.7 x 4.5 cm with surrounding rim of suspected ovarian tissue. Left ovary: Not visualized transabdominally/transvaginally. Correlate with surgical history. Other findings: Moderate pelvic ascites with internal echoes. IMPRESSION: 5.7 cm complex cystic right ovarian lesion. Given the associated findings on CT, this appearance is worrisome for primary ovarian neoplasm. Status post hysterectomy. Left ovary is not discretely visualized. Correlate with surgical history. Original Report Authenticated By: Charline Bills, M.D.    Ct Abdomen Pelvis W Contrast   12/31/2011 Comparison: 12/28/2011 chest CT Findings: Bilateral pleural effusions, left greater than right. Associated airspace consolidations. Heart size within normal limits. Coronary artery calcification. Unremarkable liver, spleen, pancreas, biliary system, adrenal glands. Symmetric renal enhancement. No hydronephrosis or hydroureter. No bowel obstruction. No CT evidence for colitis. There is a moderate amount of ascites. Peritoneal carcinomatosis, with the largest conglomerate in the left paracolic gutter, measuring approximately 7.7 x  6.2 cm and displacing the small bowel centrally. No free intraperitoneal air. No lymphadenopathy. Thin-walled bladder. Uterus not identified. 5.4 cm right adnexal cyst. Enlarged inguinal lymph nodes, measuring up to 13 mm on the left. There is scattered atherosclerotic calcification of the aorta and its branches. No aneurysmal dilatation. Multilevel degenerative changes without acute osseous finding. Multiple subcutaneous rounded densities are nonspecific and may reflect sequelae of prior injection sites, however metastases not excluded. IMPRESSION: Bilateral pleural effusions and associated consolidations are partially imaged. Peritoneal carcinomatosis and a moderate amount of ascites. 5.4 cm. Favored to reflect sequelae of  prior injection sites, however metastases not excluded. Original Report Authenticated By: Jearld Lesch, M.D.     Ct Angio Chest Pe W/cm &/or Wo Cm   12/28/2011 Comparison: Chest x-ray 12/28/2011. Findings: Pulmonary arterial opacification is satisfactory. The study is mildly degraded by patient breathing motion. No focal filling defects are evident to suggest pulmonary embolus. Large bilateral pleural effusions are present, left greater than right. There is associated collapse of proximally half the left lower lobe. There is a linear collapse in the left lower lobe and left upper lobe are associated with occluded airways, likely representing mucous plugging. The heart is mildly enlarged. Extensive coronary artery calcifications are evident. The patient is status post median sternotomy for CABG. The bone windows demonstrate exaggeration of the thoracic kyphosis. 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 IMPRESSION: 1. New large bilateral pleural effusions. 2. No definite pulmonary embolus. The study is mildly degraded by patient breathing motion. 3. Areas of linear atelectasis in the left upper and lower lobe are associated with occluded bronchi, likely representing mucus plugging. 4. Coronary artery disease status post CABG. Original Report Authenticated By: Marin Roberts, M.D.    ASSESSMENT AND PLAN:  66 y.o. 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   #2 New malignancy consistent with either a gynecologic primary or a breast cancer recurrence.CT of the abdomen and pelvis on 12/31/2011 showed Peritoneal carcinomatosis, inguinal lymphadenopathy, nonspecific anterior subcutaneous nodules, and 5.4 cm right adnexal cyst interpreted as indeterminate.Transvaginal ultrasound demonstrated a 5.7 cm complex cystic right ovarian lesion,  suspicious for primary adnexal neoplasm  Appreciate GYN Oncology evaluation. As per Dr. Denman George note, plans for 3 cycles of neoadjuvant chemotherapy after Port-A-Cath placement today,01/06/12 are being discussed.  CA 125 is to be monitored for chemo response.Latest value is  392.9 on 11/16 (CEA less than 0.5 and CA 27-29 was 35).  In addition prior to considering surgery she is to be seen at the GYN Oncology  clinic with a CT scan to evaluate intrabdominal response. Radical debulking procedure is to be considered after cardiac clearance. She is to be transferred to [facility]  To the Hospitalist Service (Dr. Darnelle Catalan), with plans for first cycle of chemo to start on 11/22 or 11/23.   #3 bilateral pleural effusions patient will have bilateral Pleurex catheters placed by Dr. Laneta Simmers due to fluid reacummulation. A Port-A-Cath is to be placed at the same time in anticipation for chemotherapy.  #4 Shortness of breath secondary to #3, improved after thoracentesis. CT angio negative for PE.   #5 ascites, s/p paracentesis x 2 on 11/17 and 11/20 with relief of symptoms.   #6 Anemia, multifactorial, in the setting of multiple blood tests drawn and dilution. Continue to monitor.  #7 Coronary artery disease , as per  Cardiology (Dr. Elease Hashimoto).   #8 after patient has had her Pleurex catheter and Port-A-Cath placed I will plan on getting her transfer to [facility] long to begin cycle 1 of chemotherapy which will consist of Taxol and carboplatinum. I have discussed this treatment with the patient and she is in agreement   #9 Nutrition: appreciate follow up during this hospitalization.   #10 Full Code.   Dr. Welton Flakes is to see the patient following this visit with further  recommendations regarding diagnosis, treatment options and further workup studies. An addendum to this note is to be written.Thank you for allowing [place] the opportunity to participate in the care of this nice patient.      [facility] E 01/06/2012, 7:21  AM  ATTENDING: Patient seen and examined. Patient with primary gynecologic malignancy. Seen by Dr. Lester Natalia. She has had pleurex catheters placed. Transferred to [facility] in am for start of systemic chemotherapy with Taxol and carboplatin.Patient very anxious to get started. And understands the risks and benefits. She will require full support.  Drue Second, MD Medical/Oncology [facility] [phone] (beeper) [phone] (Office)  01/06/2012, 10:00 PM

## 2012-01-06 NOTE — OR Nursing (Addendum)
Care Manager called @1524  to notify of bilateral pleurX catheter placement  Portacath surgical time [id number]  Pleurx catheter placement x2 start time [phone]

## 2012-01-06 NOTE — Progress Notes (Signed)
Nutrition Follow-up  Intervention:    Continue Ensure Pudding supplement 3 times daily between meals (170 kcals, 4 gm protein per 4 oz cup) RD to follow for nutrition care plan  Assessment:   Patient s/p paracentesis 11/20.  PO intake variable at 30-90% per flowsheet records.  She is taking Ensure Pudding supplements.  Awaiting to proceed with Pleurx catheter placement in conjunction with Port-A-Cath.  Plan is to transfer to [facility] to begin cycle 1 of chemotherapy.  Diet Order:  Heart Healthy  Meds: Scheduled Meds:   . aspirin  325 mg Oral Daily  . atorvastatin  20 mg Oral QPC supper  . carvedilol  25 mg Oral BID WC  . docusate sodium  100 mg Oral BID  . enoxaparin (LOVENOX) injection  40 mg Subcutaneous Q24H  . feeding supplement  1 Container Oral TID BM  . oxybutynin  10 mg Oral Daily  . pantoprazole  40 mg Oral Daily  . polyvinyl alcohol  1 drop Both Eyes Daily  . sodium chloride  3 mL Intravenous Q12H  . sodium chloride  3 mL Intravenous Q12H  . tamoxifen  20 mg Oral Daily   Continuous Infusions:  PRN Meds:.sodium chloride, acetaminophen, acetaminophen, albuterol, ALPRAZolam, alum & mag hydroxide-simeth, HYDROcodone-acetaminophen, nitroGLYCERIN, ondansetron (ZOFRAN) IV, ondansetron, sodium chloride, zolpidem   CMP     Component Value Date/Time   NA 133* 01/01/2012 0535   NA 141 12/17/2011 1144   K 3.8 01/01/2012 0535   CL 99 01/01/2012 0535   CO2 23 01/01/2012 0535   GLUCOSE 113* 01/01/2012 0535   GLUCOSE 78 12/17/2011 1144   BUN 13 01/01/2012 0535   BUN 15 12/17/2011 1144   CREATININE 0.72 01/04/2012 0506   CALCIUM 8.3* 01/01/2012 0535   PROT 7.3 12/28/2011 1553   PROT 6.7 12/17/2011 1144   ALBUMIN 3.8 09/30/2011 1307   AST 19 12/17/2011 1144   ALT 11 12/17/2011 1144   ALKPHOS 63 12/17/2011 1144   BILITOT 0.2 12/17/2011 1144   GFRNONAA 88* 01/04/2012 0506   GFRAA >90 01/04/2012 0506     Intake/Output Summary (Last 24 hours) at 01/06/12 1331 Last data filed at  01/06/12 1000  Gross per 24 hour  Intake    300 ml  Output      0 ml  Net    300 ml    Weight Status:  63.2 kg (11/21) -- stable  Estimated needs: 1800-2000 kcals, 85-100 gm protein   Nutrition Dx: Increased Nutrient Needs r/t energy & protein repletion as evidenced by estimated nutrition needs, ongoing   Goal: Oral intake with meals & supplements to meet >/= 90% of estimated nutrition needs, progressing   Monitor: PO & supplemental intake, weight, labs, I/O's   Kirkland Hun, RD, LDN  Pager #: [phone]  After-Hours Pager #: [phone]

## 2012-01-06 NOTE — Progress Notes (Signed)
VASCULAR LAB PRELIMINARY  PRELIMINARY  PRELIMINARY  PRELIMINARY  Carotid Dopplers completed.    Preliminary report:  There is no ICA stenosis.  Vertebral artery flow is antegrade.  [name] [name], 01/06/2012, 9:18 AM

## 2012-01-06 NOTE — Brief Op Note (Signed)
12/28/2011 - 01/06/2012  4:36 PM  PATIENT:  [name] [name]  66 y.o. female  PRE-OPERATIVE DIAGNOSIS:  Ovarian cancer with Malignant pleural effusions  POST-OPERATIVE DIAGNOSIS:  Ovarian cancer with Malignant Pleural effusions  PROCEDURE:  Procedure(s) (LRB) with comments: INSERTION PORT-A-CATH (Left) INSERTION PLEURAL DRAINAGE CATHETER (Bilateral)  SURGEON:  Surgeon(s) and Role:    * Alleen Borne, MD - Primary  PHYSICIAN ASSISTANT: none  ASSISTANTS: none   ANESTHESIA:   local and IV sedation  EBL:  Total I/O In: 180 [P.O.:180] Out: -   BLOOD ADMINISTERED:none  DRAINS: bilateral pleurx catheters   LOCAL MEDICATIONS USED:  LIDOCAINE   SPECIMEN:  No Specimen  DISPOSITION OF SPECIMEN:  N/A  COUNTS:  YES  PLAN OF CARE: Admit to inpatient   PATIENT DISPOSITION:  PACU - hemodynamically stable.   Delay start of Pharmacological VTE agent (>24hrs) due to surgical blood loss or risk of bleeding: no

## 2012-01-07 ENCOUNTER — Encounter (HOSPITAL_COMMUNITY): Payer: Self-pay | Admitting: Surgery

## 2012-01-07 DIAGNOSIS — Z5111 Encounter for antineoplastic chemotherapy: Secondary | ICD-10-CM

## 2012-01-07 LAB — COMPREHENSIVE METABOLIC PANEL
ALT: 11 U/L (ref 0–35)
AST: 16 U/L (ref 0–37)
Albumin: 1.7 g/dL — ABNORMAL LOW (ref 3.5–5.2)
Alkaline Phosphatase: 64 U/L (ref 39–117)
BUN: 19 mg/dL (ref 6–23)
Chloride: 96 mEq/L (ref 96–112)
Potassium: 4.3 mEq/L (ref 3.5–5.1)
Sodium: 131 mEq/L — ABNORMAL LOW (ref 135–145)
Total Bilirubin: 0.1 mg/dL — ABNORMAL LOW (ref 0.3–1.2)
Total Protein: 5.5 g/dL — ABNORMAL LOW (ref 6.0–8.3)

## 2012-01-07 LAB — CBC WITH DIFFERENTIAL/PLATELET
Basophils Relative: 0 % (ref 0–1)
Eosinophils Absolute: 0.2 10*3/uL (ref 0.0–0.7)
Hemoglobin: 10.1 g/dL — ABNORMAL LOW (ref 12.0–15.0)
MCHC: 31.9 g/dL (ref 30.0–36.0)
Monocytes Relative: 6 % (ref 3–12)
Neutro Abs: 10.4 10*3/uL — ABNORMAL HIGH (ref 1.7–7.7)
Neutrophils Relative %: 84 % — ABNORMAL HIGH (ref 43–77)
Platelets: 551 10*3/uL — ABNORMAL HIGH (ref 150–400)
RBC: 3.9 MIL/uL (ref 3.87–5.11)

## 2012-01-07 LAB — MISCELLANEOUS TEST

## 2012-01-07 MED ORDER — COLD PACK MISC ONCOLOGY
1.0000 | Freq: Once | Status: AC | PRN
  Filled 2012-01-07: qty 1

## 2012-01-07 MED ORDER — EPINEPHRINE HCL 0.1 MG/ML IJ SOLN
0.2500 mg | Freq: Once | INTRAMUSCULAR | Status: AC | PRN
  Filled 2012-01-07: qty 10

## 2012-01-07 MED ORDER — DIPHENHYDRAMINE HCL 50 MG/ML IJ SOLN
INTRAMUSCULAR | Status: AC
  Filled 2012-01-07: qty 1

## 2012-01-07 MED ORDER — METHYLPREDNISOLONE SODIUM SUCC 125 MG IJ SOLR
125.0000 mg | Freq: Once | INTRAMUSCULAR | Status: AC | PRN
  Filled 2012-01-07: qty 2

## 2012-01-07 MED ORDER — ALBUTEROL SULFATE (5 MG/ML) 0.5% IN NEBU
INHALATION_SOLUTION | RESPIRATORY_TRACT | Status: AC
  Filled 2012-01-07: qty 0.5

## 2012-01-07 MED ORDER — LORAZEPAM 0.5 MG PO TABS: 0.5000 mg | ORAL_TABLET | Freq: Four times a day (QID) | ORAL | Status: DC | PRN

## 2012-01-07 MED ORDER — SODIUM CHLORIDE 0.9 % IV SOLN
475.5000 mg | Freq: Once | INTRAVENOUS | Status: AC
  Administered 2012-01-08: 480 mg via INTRAVENOUS
  Filled 2012-01-07: qty 48

## 2012-01-07 MED ORDER — FAMOTIDINE IN NACL 20-0.9 MG/50ML-% IV SOLN
20.0000 mg | Freq: Once | INTRAVENOUS | Status: AC | PRN
  Filled 2012-01-07: qty 50

## 2012-01-07 MED ORDER — SODIUM CHLORIDE 0.9 % IJ SOLN
10.0000 mL | INTRAMUSCULAR | Status: DC | PRN
  Administered 2012-01-08: 10 mL

## 2012-01-07 MED ORDER — ALTEPLASE 2 MG IJ SOLR
2.0000 mg | Freq: Once | INTRAMUSCULAR | Status: AC | PRN
  Filled 2012-01-07: qty 2

## 2012-01-07 MED ORDER — PACLITAXEL CHEMO INJECTION 300 MG/50ML
175.0000 mg/m2 | Freq: Once | INTRAVENOUS | Status: AC
  Administered 2012-01-07: 288 mg via INTRAVENOUS
  Filled 2012-01-07: qty 48

## 2012-01-07 MED ORDER — DIPHENHYDRAMINE HCL 50 MG/ML IJ SOLN: 25.0000 mg | Freq: Once | INTRAMUSCULAR | Status: AC | PRN

## 2012-01-07 MED ORDER — PROCHLORPERAZINE MALEATE 10 MG PO TABS: 10.0000 mg | ORAL_TABLET | Freq: Four times a day (QID) | ORAL | Status: DC | PRN

## 2012-01-07 MED ORDER — SODIUM CHLORIDE 0.9 % IV SOLN: Freq: Once | INTRAVENOUS | Status: AC | PRN

## 2012-01-07 MED ORDER — EPINEPHRINE HCL 1 MG/ML IJ SOLN
0.5000 mg | Freq: Once | INTRAMUSCULAR | Status: AC | PRN
  Filled 2012-01-07: qty 1

## 2012-01-07 MED ORDER — DIPHENHYDRAMINE HCL 50 MG/ML IJ SOLN: 50.0000 mg | Freq: Once | INTRAMUSCULAR | Status: AC | PRN

## 2012-01-07 MED ORDER — ONDANSETRON HCL 8 MG PO TABS: 8.0000 mg | ORAL_TABLET | Freq: Two times a day (BID) | ORAL | Status: DC

## 2012-01-07 MED ORDER — DEXAMETHASONE 4 MG PO TABS: 8.0000 mg | ORAL_TABLET | Freq: Two times a day (BID) | ORAL | Status: DC

## 2012-01-07 MED ORDER — SODIUM CHLORIDE 0.9 % IV SOLN
Freq: Once | INTRAVENOUS | Status: AC
  Administered 2012-01-07: 19:00:00 via INTRAVENOUS

## 2012-01-07 MED ORDER — ALBUTEROL SULFATE (2.5 MG/3ML) 0.083% IN NEBU
2.5000 mg | INHALATION_SOLUTION | Freq: Once | RESPIRATORY_TRACT | Status: AC | PRN
  Filled 2012-01-07: qty 3

## 2012-01-07 MED ORDER — LIDOCAINE-PRILOCAINE 2.5-2.5 % EX CREA
TOPICAL_CREAM | Freq: Once | CUTANEOUS | Status: AC
  Administered 2012-01-07: 15:00:00 via TOPICAL
  Filled 2012-01-07: qty 5

## 2012-01-07 MED ORDER — PROCHLORPERAZINE 25 MG RE SUPP: 25.0000 mg | Freq: Two times a day (BID) | RECTAL | Status: DC | PRN

## 2012-01-07 MED ORDER — SODIUM CHLORIDE 0.9 % IJ SOLN: 3.0000 mL | INTRAMUSCULAR | Status: DC | PRN

## 2012-01-07 MED ORDER — EPINEPHRINE HCL 1 MG/ML IJ SOLN
INTRAMUSCULAR | Status: AC
  Filled 2012-01-07: qty 2

## 2012-01-07 MED ORDER — HEPARIN SOD (PORK) LOCK FLUSH 100 UNIT/ML IV SOLN: 250.0000 [IU] | Freq: Once | INTRAVENOUS | Status: AC | PRN

## 2012-01-07 MED ORDER — DIPHENHYDRAMINE HCL 50 MG/ML IJ SOLN
50.0000 mg | Freq: Once | INTRAMUSCULAR | Status: AC
  Administered 2012-01-07: 50 mg via INTRAVENOUS
  Filled 2012-01-07: qty 1

## 2012-01-07 MED ORDER — HEPARIN SOD (PORK) LOCK FLUSH 100 UNIT/ML IV SOLN: 500.0000 [IU] | Freq: Once | INTRAVENOUS | Status: AC | PRN

## 2012-01-07 MED ORDER — SODIUM CHLORIDE 0.9 % IV SOLN
Freq: Once | INTRAVENOUS | Status: AC
  Administered 2012-01-07: 18 mg via INTRAVENOUS
  Filled 2012-01-07: qty 8

## 2012-01-07 MED ORDER — FAMOTIDINE IN NACL 20-0.9 MG/50ML-% IV SOLN
20.0000 mg | Freq: Once | INTRAVENOUS | Status: AC
  Administered 2012-01-07: 20 mg via INTRAVENOUS
  Filled 2012-01-07: qty 50

## 2012-01-07 MED ORDER — EPINEPHRINE HCL 0.1 MG/ML IJ SOLN
INTRAMUSCULAR | Status: AC
  Filled 2012-01-07: qty 20

## 2012-01-07 MED ORDER — METHYLPREDNISOLONE SODIUM SUCC 125 MG IJ SOLR
INTRAMUSCULAR | Status: AC
  Filled 2012-01-07: qty 2

## 2012-01-07 NOTE — Progress Notes (Signed)
Utilization review completed.  

## 2012-01-07 NOTE — Progress Notes (Signed)
                   [address]            [place] [address]          [phone]    1 Day Post-Op Procedure(s) (LRB): INSERTION PORT-A-CATH (Left) INSERTION PLEURAL DRAINAGE CATHETER (Bilateral)  Subjective: Patient feeling "ok"  Objective: Vital signs in last 24 hours: Temp:  [97.5 F (36.4 C)-100 F (37.8 C)] 97.5 F (36.4 C) (11/22 0500) Pulse Rate:  [83-98] 88  (11/22 0500) Cardiac Rhythm:  [-] Sinus tachycardia (11/22 0900) Resp:  [16-43] 16  (11/22 0500) BP: (96-109)/(56-68) 96/59 mmHg (11/22 0500) SpO2:  [91 %-96 %] 92 % (11/22 0500) Weight:  [125 lb 11.2 oz (57.017 kg)] 125 lb 11.2 oz (57.017 kg) (11/22 0500)      Intake/Output from previous day: 11/21 0701 - 11/22 0700 In: 2211.7 [P.O.:675; I.V.:1436.7; IV Piggyback:100] Out: -    Physical Exam:  Cardiovascular: RRR Pulmonary: Slightly diminished at bases; no rales, wheezes, or rhonchi. Wounds: Dressings are clean and dry.   Lab Results: CBC:No results found for this basename: WBC:2,HGB:2,HCT:2,PLT:2 in the last 72 hours BMET: No results found for this basename: NA:2,K:2,CL:2,CO2:2,GLUCOSE:2,BUN:2,CREATININE:2,CALCIUM:2 in the last 72 hours  PT/INR: No results found for this basename: LABPROT,INR in the last 72 hours ABG:  INR: Will add last result for INR, ABG once components are confirmed Will add last 4 CBG results once components are confirmed  Assessment/Plan:  1.s/p bilateral pleural drainage catheters (history of ovarian cancer with malignant effusions). Drainage around 1200 cc from catheters. Continue with daily drainage of catheters 2.Likely to be transported to [facility] in order to undergo chemotherapy   Gleb Mcguire MPA-C 01/07/2012,9:50 AM

## 2012-01-07 NOTE — Progress Notes (Signed)
[facility] PROGRESS NOTE  [name] [name]:[id number] DOB: Jun 24, 1945 DOA: 12/28/2011 PCP: Lavonda Jumbo, MD Brief History: - Patient is a 66 y/o CF with history of breast cancer 3 years ago treated with lumpectomy and radiation therapy who presented on 11/12 with pleural effusions that were secondary to suspected new malignancy (adnexal vs breast- most likely adnexal given mass and peritoneal carcinomatosis).  She underwent a thoracentesis on day of admission.  - Antibiotics were initiated during initial evaluation for suspected pneumonia or pleural effusions 2ary to infectious etiology.  This is no longer the case and effusions likely 2ary to malignancy, & abx were soon stopped.  Her workup included a Ct of abdomen and pelvis & transvaginal U/S showing Peritoneal carcinomatosis, inguinal lymphadenopathy, nonspecific anterior subcutaneous nodules, and 5.4 cm right adnexal cyst interpreted as indeterminate.She was also found to have ascites which has been tapped on 11/17 and sent to cytology Reaccumulation of ascites & 2nd paracentesis done 11/20 for symptom tx. Gyn onc saw the pt on 11/19 & felt this was a primary ovarian ca with metastases.  They coordinated with oncology for a chemo regimen which will start once pt transferred to [facility] (01/07/12).   Prior to this, pt had a port placed by CT surgery on 01/06/12 (Dr. Laneta Simmers, the same surgeon that performed patient's CABG).  At that time, he also placed bilat pleurex catheters because of rapid reaccumulation of malignant pleural fluid. today.   Assessment/Plan:  1. Suspected Metastatic Ovarian cancer- Seen by Gyn Onc & Onc.  Plan after placement of port is transfer to [facility] tomorrow for starting of chemotherapy 1. Malignant Pleural effusion -Recurrent.  Use bilat pleurex catheters to treat. 2. Tachycardia: Resolved, CT angiogram negative.  Heart rate WNL's and last recorded 83. 3. Dyspnea: resolved after thoracentesis and was 2ary to  pleural effusions (BL) likely 2ary to malignancy 4. CAD s/p CABG: Continue aspirin, atorvastatin 5. H/o breast cancer:  6. Htn: continue current regimen will continue to monitor. 7. Syncopal episode:  (occured 01/03/12 while doing physical therapy) - Etiology uncertain. No red flags reported on telemetry monitoring - No seizure like activity reported.  Will defer EEG - order carotid doppler 01/04/12 - Patient had reportedly hypoxia when evaluated during syncopal episode, which may have been from pleural effusions   Code Status: full Family Communication: None at bedside today, husband met on previous day Disposition Plan: Tx to [place] today   Consultants:  Pulmonary : Dr. Craige Cotta  Oncology: Dr. Welton Flakes  GYN oncologist: Birdie Riddle  CT surgeon: Dr. Laneta Simmers  Procedures:  Thoracentesis 12/28/11 & 11/21-by pleurex catheter  Paracentesis 01/02/12 & 11/20  Antibiotics:  levaquin d/c 11/12-11//17/13  Trimethoprim d/c 11/12-11/17/13  HPI/Subjective: Seen this morning after Pleurx catheters drained and 2.5 L of fluid emptied. She still feeling very tired although breathing easier. Some soreness over the port site. Once to go to [place] [facility] soon.  Objective: Filed Vitals:   01/06/12 1714 01/06/12 2100 01/07/12 0500 01/07/12 0930  BP: 103/63 103/68 96/59 100/70  Pulse: 87 98 88   Temp: 97.6 F (36.4 C) 100 F (37.8 C) 97.5 F (36.4 C)   TempSrc:  Oral Oral   Resp: 20 16 16    Height:      Weight:   57.017 kg (125 lb 11.2 oz)   SpO2: 95% 91% 92%     Intake/Output Summary (Last 24 hours) at 01/07/12 1341 Last data filed at 01/07/12 0945  Gross per 24 hour  Intake 2507.67 ml  Output  1200 ml  Net 1307.67 ml   Filed Weights   01/05/12 0500 01/06/12 0511 01/07/12 0500  Weight: 61.825 kg (136 lb 4.8 oz) 63.277 kg (139 lb 8 oz) 57.017 kg (125 lb 11.2 oz)    Exam:   General:  Fatigue, Alert and Oriented x 3  Cardiovascular: RRR, No MRG  Chest: Port in  place  Respiratory: CTA BL, decreased breath sounds at the bases   Abdomen: soft, NT, nodules flanking umbilicus  Extremities: No clubbing or cyanosis or edema  Data Reviewed: Basic Metabolic Panel:  Lab 01/04/12 6213 01/01/12 0535  NA -- 133*  K -- 3.8  CL -- 99  CO2 -- 23  GLUCOSE -- 113*  BUN -- 13  CREATININE 0.72 0.69  CALCIUM -- 8.3*  MG -- --  PHOS -- --   CBC:  Lab 01/01/12 0535  WBC 8.6  NEUTROABS --  HGB 9.5*  HCT 29.3*  MCV 81.4  PLT 475*   BNP (last 3 results)  Basename 12/28/11 1109  PROBNP 828.2*     Recent Results (from the past 240 hour(s))  AFB CULTURE WITH SMEAR     Status: Normal (Preliminary result)   Collection Time   12/28/11  3:30 PM      Component Value Range Status Comment   Specimen Description PLEURAL FLUID LEFT   Final    Special Requests   Final    ACID FAST SMEAR NO ACID FAST BACILLI SEEN   Final    Culture     Final    Value: CULTURE WILL BE EXAMINED FOR 6 WEEKS BEFORE ISSUING A FINAL REPORT   Report Status PENDING   Incomplete   BODY FLUID CULTURE     Status: Normal   Collection Time   12/28/11  3:30 PM      Component Value Range Status Comment   Specimen Description PLEURAL FLUID LEFT   Final    Special Requests   Final    Gram Stain     Final    Value: RARE WBC PRESENT, PREDOMINANTLY PMN     NO ORGANISMS SEEN   Culture NO GROWTH 3 DAYS   Final    Report Status 12/31/2011 FINAL   Final   FUNGUS CULTURE W SMEAR     Status: Normal (Preliminary result)   Collection Time   12/28/11  3:30 PM      Component Value Range Status Comment   Specimen Description PLEURAL FLUID LEFT   Final    Special Requests   Final    Fungal Smear NO YEAST OR FUNGAL ELEMENTS SEEN   Final    Culture CULTURE IN PROGRESS FOR FOUR WEEKS   Final    Report Status PENDING   Incomplete   BODY FLUID CULTURE     Status: Normal   Collection Time   12/29/11 11:40 AM      Component Value Range Status Comment   Specimen Description PLEURAL  FLUID RIGHT   Final    Special Requests   Final    Gram Stain     Final    Value: NO WBC SEEN     NO ORGANISMS SEEN   Culture NO GROWTH 3 DAYS   Final    Report Status 01/02/2012 FINAL   Final   AFB CULTURE WITH SMEAR     Status: Normal (Preliminary result)   Collection Time   12/29/11 11:40 AM      Component Value Range Status  Comment   Specimen Description PLEURAL FLUID RIGHT   Final    Special Requests   Final    ACID FAST SMEAR NO ACID FAST BACILLI SEEN   Final    Culture     Final    Value: CULTURE WILL BE EXAMINED FOR 6 WEEKS BEFORE ISSUING A FINAL REPORT   Report Status PENDING   Incomplete   FUNGUS CULTURE W SMEAR     Status: Normal (Preliminary result)   Collection Time   12/29/11 11:40 AM      Component Value Range Status Comment   Specimen Description PLEURAL FLUID RIGHT   Final    Special Requests   Final    Fungal Smear NO YEAST OR FUNGAL ELEMENTS SEEN   Final    Culture CULTURE IN PROGRESS FOR FOUR WEEKS   Final    Report Status PENDING   Incomplete   BODY FLUID CULTURE     Status: Normal   Collection Time   01/02/12 10:29 AM      Component Value Range Status Comment   Specimen Description PERITONEAL   Final    Special Requests NONE   Final    Gram Stain     Final    Value: NO WBC SEEN     NO ORGANISMS SEEN   Culture NO GROWTH 3 DAYS   Final    Report Status 01/06/2012 FINAL   Final   SURGICAL PCR SCREEN     Status: Normal   Collection Time   01/05/12  7:17 PM      Component Value Range Status Comment   MRSA, PCR NEGATIVE  NEGATIVE Final    Staphylococcus aureus NEGATIVE  NEGATIVE Final      Studies: Dg Chest 2 View  01/03/2012  IMPRESSION: Continued bilateral pleural effusions.  The moderate effusion on the left shows some further increase since 12/31/2011.   Original Report Authenticated By: Erskine Speed, M.D.     Scheduled Meds:    . aspirin  325 mg Oral Daily  . atorvastatin  20 mg Oral QPC supper  . carvedilol  3.125 mg Oral BID WC   . ceFAZolin  1 g Intravenous Q8H  . docusate sodium  100 mg Oral BID  . enoxaparin (LOVENOX) injection  40 mg Subcutaneous Q24H  . feeding supplement  1 Container Oral TID BM  . oxybutynin  10 mg Oral Daily  . pantoprazole  40 mg Oral Daily  . polyvinyl alcohol  1 drop Both Eyes Daily  . sodium chloride  3 mL Intravenous Q12H  . tamoxifen  20 mg Oral Daily  . [DISCONTINUED] carvedilol  25 mg Oral BID WC  . [DISCONTINUED] sodium chloride  3 mL Intravenous Q12H   Continuous Infusions:    . lactated ringers 50 mL/hr at 01/06/12 2353    Principal Problem:  *Ovarian cancer Active Problems:  Breast cancer  Pure hypercholesterolemia  Coronary artery disease  Pleural effusion  Tachycardia  Dyspnea  HTN (hypertension)  Ascites  Peritoneal carcinomatosis  Syncopal episodes    Time spent: 25 minutes    Hollice Espy  [facility] Pager [phone]. If 8PM-8AM, please contact night-coverage at [email or web address], password [facility] 01/07/2012, 1:41 PM  LOS: 10 days

## 2012-01-07 NOTE — Progress Notes (Signed)
PT Cancellation Note  Patient Details Name: EMRYS MCEACHRON MRN: [id number] DOB: 1945/03/11   Cancelled Treatment:    Reason Eval/Treat Not Completed: Medical issues which prohibited therapy;Patient at procedure or test/unavailable   INGOLD,Shenoa Hattabaugh 01/07/2012, 4:12 PM [facility] [phone] (pager)

## 2012-01-07 NOTE — Consult Note (Signed)
[name] [name]   DOB:[birth year]   ZO#:[id number]   JWJ#:[id number]  Subjective:  Patient is a 66 y/o CF with history of breast cancer 3 years ago treated with lumpectomy and radiation therapy. Presented with pleural effusions that were secondary to suspected new malignancy (adnexal vs breast- most likely adnexal given mass and peritoneal carcinomatosis). Also found to have ascites which has been tapped and sent to cytology. - Ct of abdomen and pelvis showing Peritoneal carcinomatosis, inguinal lymphadenopathy, nonspecific anterior subcutaneous nodules, and 5.4 cm right adnexal cyst interpreted as indeterminate.  - Paracentesis with cytology on 01/02/12. Reaccumulation of ascites & 2nd paracentesis done today 11/20  - Transvaginal ultrasound complete 01/02/12  -  Had bilateral pleurx catheters placed for malignant effusions and was transferred to [facility] long for chemo.  Right now she's about to get her chemo, but the nurses are having a hard time getting blood return from her port that was placed yesterday.  [name] feeling okay, and is planning on being discharged tomorrow.       Objective:  Filed Vitals:   01/07/12 1351  BP: 101/60  Pulse: 95  Temp: 98.7 F (37.1 C)  Resp: 22    Body mass index is 20.29 kg/(m^2).  Intake/Output Summary (Last 24 hours) at 01/07/12 1721 Last data filed at 01/07/12 0945  Gross per 24 hour  Intake 1707.67 ml  Output   1200 ml  Net 507.67 ml     Sclerae unicteric  Oropharynx clear  No peripheral adenopathy  Lungs clear -- no rales or rhonchi  Heart regular rate and rhythm  Abdomen benign  MSK no focal spinal tenderness, no peripheral edema  Neuro nonfocal   CBG (last 3)  No results found for this basename: GLUCAP:3 in the last 72 hours   Labs:  Lab Results  Component Value Date   WBC 8.6 01/01/2012   HGB 9.5* 01/01/2012   HCT 29.3* 01/01/2012   MCV 81.4 01/01/2012   PLT 475* 01/01/2012   NEUTROABS 6.9 12/28/2011    Urine  Studies No results found for this basename: UACOL:2,UAPR:2,USPG:2,UPH:2,UTP:2,UGL:2,UKET:2,UBIL:2,UHGB:2,UNIT:2,UROB:2,ULEU:2,UEPI:2,UWBC:2,URBC:2,UBAC:2,CAST:2,CRYS:2,UCOM:2,BILUA:2 in the last 72 hours  Basic Metabolic Panel:  Lab 01/04/12 6213 01/01/12 0535  NA -- 133*  K -- 3.8  CL -- 99  CO2 -- 23  GLUCOSE -- 113*  BUN -- 13  CREATININE 0.72 0.69  CALCIUM -- 8.3*  MG -- --  PHOS -- --   GFR Estimated Creatinine Clearance: 63.1 ml/min (by C-G formula based on Cr of 0.72). Liver Function Tests: No results found for this basename: AST:5,ALT:5,ALKPHOS:5,BILITOT:5,PROT:5,ALBUMIN:5 in the last 168 hours No results found for this basename: LIPASE:5,AMYLASE:5 in the last 168 hours No results found for this basename: AMMONIA:5 in the last 168 hours Coagulation profile No results found for this basename: INR:5,PROTIME:5 in the last 168 hours  CBC:  Lab 01/01/12 0535  WBC 8.6  NEUTROABS --  HGB 9.5*  HCT 29.3*  MCV 81.4  PLT 475*   Cardiac Enzymes: No results found for this basename: CKTOTAL:5,CKMB:5,CKMBINDEX:5,TROPONINI:5 in the last 168 hours BNP: No components found with this basename: POCBNP:5 CBG: No results found for this basename: GLUCAP:5 in the last 168 hours D-Dimer No results found for this basename: DDIMER:2 in the last 72 hours Hgb A1c No results found for this basename: HGBA1C:2 in the last 72 hours Lipid Profile No results found for this basename: CHOL:2,HDL:2,LDLCALC:2,TRIG:2,CHOLHDL:2,LDLDIRECT:2 in the last 72 hours Thyroid function studies No results found for this basename: TSH,T4TOTAL,FREET3,T3FREE,THYROIDAB in the last 72 hours Anemia  work up No results found for this basename: VITAMINB12:2,FOLATE:2,FERRITIN:2,TIBC:2,IRON:2,RETICCTPCT:2 in the last 72 hours Microbiology Recent Results (from the past 240 hour(s))  BODY FLUID CULTURE     Status: Normal   Collection Time   12/29/11 11:40 AM      Component Value Range Status Comment   Specimen  Description PLEURAL FLUID RIGHT   Final    Special Requests   Final    Gram Stain     Final    Value: NO WBC SEEN     NO ORGANISMS SEEN   Culture NO GROWTH 3 DAYS   Final    Report Status 01/02/2012 FINAL   Final   AFB CULTURE WITH SMEAR     Status: Normal (Preliminary result)   Collection Time   12/29/11 11:40 AM      Component Value Range Status Comment   Specimen Description PLEURAL FLUID RIGHT   Final    Special Requests   Final    ACID FAST SMEAR NO ACID FAST BACILLI SEEN   Final    Culture     Final    Value: CULTURE WILL BE EXAMINED FOR 6 WEEKS BEFORE ISSUING A FINAL REPORT   Report Status PENDING   Incomplete   FUNGUS CULTURE W SMEAR     Status: Normal (Preliminary result)   Collection Time   12/29/11 11:40 AM      Component Value Range Status Comment   Specimen Description PLEURAL FLUID RIGHT   Final    Special Requests   Final    Fungal Smear NO YEAST OR FUNGAL ELEMENTS SEEN   Final    Culture CULTURE IN PROGRESS FOR FOUR WEEKS   Final    Report Status PENDING   Incomplete   BODY FLUID CULTURE     Status: Normal   Collection Time   01/02/12 10:29 AM      Component Value Range Status Comment   Specimen Description PERITONEAL   Final    Special Requests NONE   Final    Gram Stain     Final    Value: NO WBC SEEN     NO ORGANISMS SEEN   Culture NO GROWTH 3 DAYS   Final    Report Status 01/06/2012 FINAL   Final   SURGICAL PCR SCREEN     Status: Normal   Collection Time   01/05/12  7:17 PM      Component Value Range Status Comment   MRSA, PCR NEGATIVE  NEGATIVE Final    Staphylococcus aureus NEGATIVE  NEGATIVE Final       Studies:  Dg Chest Port 1 View  01/06/2012  *RADIOLOGY REPORT*  Clinical Data: Port-A-Cath and Pleurx catheter placement.  PORTABLE CHEST - 1 VIEW  Comparison: 01/06/2012 at 6:39 a.m.  Findings: Left-sided Port-A-Cath noted with tip projecting over the SVC.  Bilateral pleural drainage catheters noted, with significant reduction in  both pleural effusions.  Moderate left and trace right pleural effusions persist with associated atelectasis.  No pneumothorax observed.  CABG markers noted. A small amount of retrocardiac lucency on the left probably represents a small amount of aerated lung in this vicinity.  IMPRESSION:  1.  Bilateral pleural drainage catheter placement with significant reduction in both pleural effusions.  Moderate left and trace right pleural effusions persist. 2.  Port-A-Cath tip projects over the SVC.  No pneumothorax observed. 3.  Retrocardiac lucency likely represents a small aerated subsegmental section of lung.   Original Report Authenticated  By: Gaylyn Rong, M.D.    Dg Chest Port 1 View  01/06/2012  *RADIOLOGY REPORT*  Clinical Data: Pleural effusion  PORTABLE CHEST - 1 VIEW  Comparison: 01/03/2012  Findings: Bilateral pleural effusions left greater than right are not significantly changed allowing for changes in patient positioning.  There is associated volume loss.  Normal heart size. No pneumothorax.  IMPRESSION: Stable bilateral pleural effusions left greater than right.   Original Report Authenticated By: Jolaine Click, M.D.    Dg Fluoro Guide Cv Line-no Report  01/06/2012  CLINICAL DATA: Port a  catheter placement   FLOURO GUIDE CV LINE  Fluoroscopy was utilized by the requesting physician.  No radiographic  interpretation.     Dg C-arm 1-60 Min-no Report  01/06/2012  CLINICAL DATA: pleurx catheter placement x 2   C-ARM 1-60 MINUTES  Fluoroscopy was utilized by the requesting physician.  No radiographic  interpretation.      Assessment: 66 y.o. with newly diagnosed ovarian cancer.  Starting on Carbo/Taxol.      Plan:  Proceed with chemotherapy.  If we can't get blood return from the port, we can use a peripheral IV.  If discharged over the weekend, we will follow up with the patient on Tuesday November 26.    Cherie Ouch Lyn Hollingshead, NP Medical Oncology [facility] Phone:  [phone]  Augustin Schooling 01/07/2012  ATTENDING'S ATTESTATION:  I personally reviewed patient's chart, examined patient myself, formulated the treatment plan as followed.    1. Proceed with chemotherapy tonight. Since port is not working we can give taxol/carb peripherally 2. Get stat CBC/CMET now. 3. Hopefully patient may be able to go home on Saturday or Sunday.  Drue Second, MD Medical/Oncology [facility] [phone] (beeper) [phone] (Office)  01/07/2012, 5:34 PM

## 2012-01-07 NOTE — Progress Notes (Signed)
Patient completed first 45 minutes of Taxol infusion, with incremental increase, with no problems.  Chemo infusing in peripheral iv, which shows no signs of infiltration or phlebitis.  Patient currently with no complaints.  IV team recommends getting an x-ray of port-a-cath, to evaluate the location of the tip.  Will continue to monitor.  Philomena Doheny RN

## 2012-01-07 NOTE — Progress Notes (Signed)
Attempted access of patient's port-a-cath twice, with no blood return.  IV team contacted, and not able to make it due to TNA.  Kristen RN to attempt access.  Philomena Doheny RN

## 2012-01-07 NOTE — Op Note (Signed)
NAME:  [name] [name], [name] [name] NO.:  [id number]  MEDICAL RECORD NO.:  [id number]  LOCATION:  [id number]                        FACILITY:  [facility]  PHYSICIAN:  Evelene Croon, M.D.     DATE OF BIRTH:  Aug 31, 1945  DATE OF PROCEDURE:  01/06/2012 DATE OF DISCHARGE:                              OPERATIVE REPORT   PREOPERATIVE DIAGNOSIS:  Bilateral pleural effusions secondary to metastatic ovarian cancer.  POSTOPERATIVE DIAGNOSIS:  Bilateral pleural effusions secondary to metastatic ovarian cancer.  PROCEDURE: 1. Insertion of Port-A-Cath in the left subclavian vein. 2. Insertion of bilateral PleurX catheters.  ATTENDING SURGEON:  Evelene Croon, MD  ANESTHESIA:  MAC with local.  CLINICAL HISTORY:  This patient is a 66 year old woman who recently underwent coronary artery bypass graft surgery over the summer and now presents with progressive shortness of breath.  She was found to have large bilateral pleural effusions as well as ascites.  She underwent CT scan of the chest and abdomen which showed an adnexal mass and peritoneal carcinomatosis as well as large bilateral pleural effusions. Thoracentesis and paracentesis were performed and cytology showed ovarian carcinoma.  She still had significant pleural effusion after thoracentesis.  It was felt that insertion of bilateral PleurX catheter was the best way to manage her bilateral pleural effusions and that she would require a Port-A-Cath for chemotherapy start tomorrow.  I discussed the operative procedure with the patient including alternatives, benefits, and risks including, but not limited to bleeding, infection, venous thrombosis, pneumothorax, and infection of the Port-A-Cath or the PleurX catheters.  She understood all this and agreed to proceed.  OPERATIVE PROCEDURE:  The patient was seen in the preoperative holding area and the proper patient, proper operation were confirmed with nursing staff and the patient.  Consent  was signed by me.  She was given gram of intravenous Ancef.  She was taken back to the operating room and placed on table in supine position.  After intravenous sedation by Anesthesiology, the neck, chest, and abdomen were prepped and draped in usual sterile manner.  Attention was first turned to Port-A-Cath insertion.  A 1% lidocaine local anesthesia with epinephrine was used. Then, the skin and subcutaneous tissue in the left infraclavicular region was anesthetized.  A short transverse incision was made below the left clavicle and carried down through the subcutaneous tissue using electrocautery.  A subcutaneous pocket was made just anterior to the pectoral fascia.  Then, the left subclavian vein was cannulated with a needle and a guidewire was advanced into the right side of the heart under fluoroscopic guidance.  The catheter was then cut to the appropriate length so that the tip align with superior vena cava.  We used a 9.6-French preattached power port with reference [id number] and lot number [id number].  This was flushed with heparinized saline solution. Then, the 10-French introducer and sheath was inserted over the guidewire under fluoroscopic guidance and the guidewire and introducer were removed.  Through the sheath, the catheter was inserted under fluoroscopic guidance.  The peel-away sheath was removed.  The port was then placed in the subcutaneous pocket and fixed to the pectoral fascia with two 2-0 silk sutures.  The port was then flushed with 3 mL of concentrated  heparin solution at 1000 units/mL.  There was complete hemostasis.  The subcutaneous tissue was reapproximated using continuous 3-0 Vicryl suture and the skin with a 4-0 Vicryl suture in a subcuticular manner.  Dermabond was applied over the incision.  Then, attention was turned to the left PleurX catheter.  The skin and subcutaneous tissue in the midaxillary line over the 8th intercostal space was anesthetized with  1% lidocaine local anesthesia.  This was infiltrated down to the intercostal space and the pleural space was entered with return of serous fluid.  Then, a small stab incision was made and the needle catheter was inserted until pleural fluid was obtained.  The catheter was advanced off of the needle into the pleural space.  Then, the guidewire was then placed through the introducer into the pleural space under fluoroscopic guidance.  The introducer was removed.  Then, the PleurX catheter was cut to the appropriate length. A skin exit site was marked anteriorly in the anterior axillary line just above the left costal margin.  A small stab incision was made.  The subcutaneous tunnel was anesthetized with 1% lidocaine local anesthesia and then the catheter was advanced through the tunnel so that the cuff was lying just inside the skin exit site.  Then, over the guide wire the introducer and sheath were inserted into the pleural space.  The guidewire and introducer were removed and the PleurX catheter was inserted into the left pleural space through the peel-away sheath.  The sheath was removed.  The catheter was then connected to vacuum suction and about 1500 mL of serous fluid was removed.  The catheter was anchored at the exit site using a 3-0 nylon suture.  The chest wall entry site was closed with a single 4-0 Vicryl subcuticular stitch. Dermabond was applied over this incision.  Then, the right PleurX catheter was inserted in the exact same manner. We removed about 800 mL of serous fluid from the right pleural space. At the conclusion of this part of the procedure, the sponge, needle, and instrument counts were correct according to the scrub nurse.  Dry sterile dressing was applied around the PleurX catheters which were capped and rolled up beneath the dressings.  All the other incisions had Dermabond applied.  The patient tolerated the procedure well, was transported to the post  anesthesia care unit in satisfactory and stable condition.     Evelene Croon, M.D.     BB/MEDQ  D:  01/06/2012  T:  01/07/2012  Job:  [id number]

## 2012-01-08 DIAGNOSIS — C786 Secondary malignant neoplasm of retroperitoneum and peritoneum: Secondary | ICD-10-CM

## 2012-01-08 DIAGNOSIS — Z170 Estrogen receptor positive status [ER+]: Secondary | ICD-10-CM

## 2012-01-08 MED ORDER — HYDROCODONE-ACETAMINOPHEN 5-325 MG PO TABS
1.0000 | ORAL_TABLET | ORAL | Status: DC | PRN
Start: 2012-01-08 — End: 2012-04-13

## 2012-01-08 MED ORDER — HEPARIN SOD (PORK) LOCK FLUSH 100 UNIT/ML IV SOLN
INTRAVENOUS | Status: AC
  Filled 2012-01-08: qty 5

## 2012-01-08 MED ORDER — ALPRAZOLAM 0.25 MG PO TABS: 0.2500 mg | ORAL_TABLET | Freq: Three times a day (TID) | ORAL | Status: DC | PRN

## 2012-01-08 NOTE — Progress Notes (Signed)
Pleur-ex bilaterally drained this am 0900. 250 mL ambler clear fluid drained Left, pt experienced discomfort, drain clamped, 2 Vicodin given. 100 mL clear golden fluid drained Right, less painful discomfort, drain clamped. Cap and dressing changed.

## 2012-01-08 NOTE — Progress Notes (Signed)
[facility] PROGRESS NOTE    Subjective  She reports tolerating the chemotherapy well. No nausea or symptoms of an allergic reaction. She is concerned the Port-A-Cath will not return blood.   MEDICATIONS:    . [COMPLETED] sodium chloride   Intravenous Once  . [EXPIRED] albuterol      . aspirin  325 mg Oral Daily  . atorvastatin  20 mg Oral QPC supper  . [COMPLETED] CARBOplatin  480 mg Intravenous Once  . carvedilol  3.125 mg Oral BID WC  . [EXPIRED] diphenhydrAMINE      . [COMPLETED] diphenhydrAMINE  50 mg Intravenous Once  . docusate sodium  100 mg Oral BID  . enoxaparin (LOVENOX) injection  40 mg Subcutaneous Q24H  . [EXPIRED] EPINEPHrine      . [EXPIRED] EPINEPHrine      . [COMPLETED] famotidine  20 mg Intravenous Once  . feeding supplement  1 Container Oral TID BM  . [COMPLETED] lidocaine-prilocaine   Topical Once  . [EXPIRED] methylPREDNISolone sodium succinate      . [COMPLETED] ondansetron (ZOFRAN) with dexamethasone (DECADRON) IV   Intravenous Once  . oxybutynin  10 mg Oral Daily  . [COMPLETED] PACLitaxel  175 mg/m2 (Treatment Plan Actual) Intravenous Once  . pantoprazole  40 mg Oral Daily  . polyvinyl alcohol  1 drop Both Eyes Daily  . sodium chloride  3 mL Intravenous Q12H  . tamoxifen  20 mg Oral Daily  . [DISCONTINUED] ceFAZolin  1 g Intravenous Q8H    ALLERGIES:  is allergic to codeine; isosorbide; other; phenothiazines; talwin; and yellow jacket venom.   PHYSICAL EXAMINATION:   Filed Vitals:   01/08/12 0530  BP: 112/71  Pulse: 95  Temp: 98.1 F (36.7 C)  Resp: 18   Filed Weights   01/06/12 0511 01/07/12 0500 01/08/12 0530  Weight: 139 lb 8 oz (63.277 kg) 125 lb 11.2 oz (57.017 kg) 132 lb (59.875 kg)        Lungs : Bilateral Pleurx in place, rails at the left lower posterior chest and right upper posterior chest, no respiratory distress Cardiac: regular rate and rhythm  Abdomen: distended Extremities: No leg  edema Port-A-Cath in place at the left upper chest and without surrounding erythema  LABORATORY/RADIOLOGY DATA:   Lab 01/07/12 1746  WBC 12.4*  HGB 10.1*  HCT 31.7*  PLT 551*  MCV 81.3  MCH 25.9*  MCHC 31.9  RDW 14.3  LYMPHSABS 1.1  MONOABS 0.7  EOSABS 0.2  BASOSABS 0.0  BANDABS --    CMP    Lab 01/07/12 1746 01/04/12 0506  NA 131* --  K 4.3 --  CL 96 --  CO2 27 --  GLUCOSE 120* --  BUN 19 --  CREATININE 0.61 0.72  CALCIUM 8.1* --  MG -- --  AST 16 --  ALT 11 --  ALKPHOS 64 --  BILITOT 0.1* --        Component Value Date/Time   BILITOT 0.1* 01/07/2012 1746      Radiology Studies:  US Paracentesis  01/05/2012  Comparison:  Previous paracentesis  An ultrasound guided paracentesis was thoroughly discussed with the patient and questions answered.  The benefits, risks, alternatives and complications were also discussed.  The patient understands and wishes to proceed with the procedure.  Written consent was obtained.  Ultrasound was performed to localize and mark an adequate pocket of fluid in the right lower quadrant of the abdomen.  The area was then prepped and draped in the  normal sterile fashion.  1% Lidocaine was used for local anesthesia.  Under ultrasound guidance a 19 gauge Yueh catheter was introduced.  Paracentesis was performed.  The catheter was removed and a dressing applied.  Complications:  None immediate  Findings:  A total of approximately 800 ml of blood tinged fluid was removed.  A fluid sample was not sent for laboratory analysis.  IMPRESSION: Successful ultrasound guided paracentesis yielding 800 ml of ascites.  Read by Brayton El PA-C   Original Report Authenticated By: Richarda Overlie, M.D.    US Paracentesis   01/02/2012 Comparison: CT imaging of the abdomen and pelvis An ultrasound guided paracentesis was thoroughly discussed with the patient and questions answered. The benefits, risks, alternatives and complications were also discussed. The  patient understands and wishes to proceed with the procedure. Written consent was obtained. Ultrasound was performed to localize and mark an adequate pocket of fluid in the right lower quadrant of the abdomen. The area was then prepped and draped in the normal sterile fashion. 1% Lidocaine was used for local anesthesia. Under ultrasound guidance a 19 gauge Yueh catheter was introduced. Paracentesis was performed. The catheter was removed and a dressing applied. Complications: None immediate Findings: A total of approximately 1.1 liters of bloody serous fluid was removed. A fluid sample was sent for laboratory analysis. IMPRESSION: Successful ultrasound guided paracentesis yielding 1.1 liters of ascites. Read by: Anselm Pancoast, P.A.-C Original Report Authenticated By: Richarda Overlie, M.D.    US Transvaginal Non-ob   11/17/2013omparison: None Findings: Uterus: Surgically absent. Right ovary: Complex cystic right adnexal mass measuring 3.1 x 5.7 x 4.5 cm with surrounding rim of suspected ovarian tissue. Left ovary: Not visualized transabdominally/transvaginally. Correlate with surgical history. Other findings: Moderate pelvic ascites with internal echoes. IMPRESSION: 5.7 cm complex cystic right ovarian lesion. Given the associated findings on CT, this appearance is worrisome for primary ovarian neoplasm. Status post hysterectomy. Left ovary is not discretely visualized. Correlate with surgical history. Original Report Authenticated By: Charline Bills, M.D.    Ct Abdomen Pelvis W Contrast   12/31/2011 Comparison: 12/28/2011 chest CT Findings: Bilateral pleural effusions, left greater than right. Associated airspace consolidations. Heart size within normal limits. Coronary artery calcification. Unremarkable liver, spleen, pancreas, biliary system, adrenal glands. Symmetric renal enhancement. No hydronephrosis or hydroureter. No bowel obstruction. No CT evidence for colitis. There is a moderate amount of  ascites. Peritoneal carcinomatosis, with the largest conglomerate in the left paracolic gutter, measuring approximately 7.7 x 6.2 cm and displacing the small bowel centrally. No free intraperitoneal air. No lymphadenopathy. Thin-walled bladder. Uterus not identified. 5.4 cm right adnexal cyst. Enlarged inguinal lymph nodes, measuring up to 13 mm on the left. There is scattered atherosclerotic calcification of the aorta and its branches. No aneurysmal dilatation. Multilevel degenerative changes without acute osseous finding. Multiple subcutaneous rounded densities are nonspecific and may reflect sequelae of prior injection sites, however metastases not excluded. IMPRESSION: Bilateral pleural effusions and associated consolidations are partially imaged. Peritoneal carcinomatosis and a moderate amount of ascites. 5.4 cm. Favored to reflect sequelae of prior injection sites, however metastases not excluded. Original Report Authenticated By: Jearld Lesch, M.D.     Ct Angio Chest Pe W/cm &/or Wo Cm   12/28/2011 Comparison: Chest x-ray 12/28/2011. Findings: Pulmonary arterial opacification is satisfactory. The study is mildly degraded by patient breathing motion. No focal filling defects are evident to suggest pulmonary embolus. Large bilateral pleural effusions are present, left greater than right. There is associated collapse of proximally half  the left lower lobe. There is a linear collapse in the left lower lobe and left upper lobe are associated with occluded airways, likely representing mucous plugging. The heart is mildly enlarged. Extensive coronary artery calcifications are evident. The patient is status post median sternotomy for CABG. The bone windows demonstrate exaggeration of the thoracic kyphosis. 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 IMPRESSION: 1. New large bilateral pleural effusions. 2. No definite pulmonary embolus. The study is mildly degraded by patient breathing motion. 3.  Areas of linear atelectasis in the left upper and lower lobe are associated with occluded bronchi, likely representing mucus plugging. 4. Coronary artery disease status post CABG. Original Report Authenticated By: Marin Roberts, M.D.    ASSESSMENT AND PLAN:  66 y.o. 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   #2 peritoneal carcinomatosis-status post cycle 1 of Taxol/carboplatin chemotherapy on 01/07/2012   #3 bilateral pleural effusions -status post bilateral Pleuryx catheter placement on 01/06/2012 #4 Shortness of breath secondary to #3, improved after thoracentesis. CT angio negative for PE.   #5 ascites, s/p paracentesis x 2 on 11/17 and 11/20 with relief of symptoms.   #6 Anemia  #7 Coronary artery disease , as per Cardiology (Dr. Elease Hashimoto).   #8 status post Port-A-Cath placement 01/06/2012-the floor nurses are unable to obtain blood return from the Port-A-Cath. We will request a dye 70 in radiology today.     She appears to have tolerated the chemotherapy well. She is stable for discharge to home from an oncology standpoint. We will ask for a dye study to evaluate the Port-A-Cath.    Syleena Mchan 01/08/2012, 9:20 AM

## 2012-01-08 NOTE — Progress Notes (Signed)
Patient would like to follow up with MD regarding Home Health Services Prior to discharge.  She will need an RN to help drain her pleurex catheters while at home.  Patient would also like for her portacath to be checked prior to being discharged.  MD, please discuss these issues with this patient.  Thanks, Kenton Kingfisher [place]

## 2012-01-08 NOTE — Progress Notes (Signed)
Patient Last night tolerated Chemotherapy well.  Again tried to flush PAC and it was sluggish to flush with no blood return.  During Taxol infusion last night; noted that there was fluid under peripheral IV dressing while chemo was infusing; Chemo was immediately  paused and a new Peripheral IV was inserted w/ great blood return noted on the other arm.  Old peripheral IV was d/c'd and site appeared WNL and no complaints from patient. Will continue to monitor.Kenton Kingfisher [place]

## 2012-01-08 NOTE — Discharge Summary (Signed)
Physician Discharge Summary  [name] [name] WUJ:[id number] DOB: [birth year] DOA: 12/28/2011  PCP: Lavonda Jumbo, MD  Admit date: 12/28/2011 Discharge date: 01/08/2012  Recommendations for Outpatient Follow-up:  1. Home health nurse setup who will drain her Pleurx catheters daily if drainage amount greater than 100 cc. Once drainage is less than 100 cc, catheter can be drained every other day. 2. Home health physical therapy set up. 3. Dye study to evaluate Port-A-Cath unable to be done in the hospital over the weekend. Recommend this be done as an outpatient. Will schedule prior to discharge and give the patient her appointment time.  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  Diet recommendation: Regular.  History of present illness:  [name] [name] is a 66 year old female who was admitted on 12/28/2011 with a chief complaint of dyspnea, cough, and tachycardia. She has had symptoms progressive for several weeks prior to this. Upon initial evaluation in the emergency department, chest radiographs showed a significant pleural effusion and she subsequently was referred to the hospitalist service for further evaluation and diagnostic/therapeutic thoracentesis.  Hospital Course by problem:  Principal Problem:  *Ovarian cancer/peritoneal carcinomatosis status post chemotherapy   The patient was admitted and a diagnostic evaluation undertaken given her pleural effusion. She underwent CT scans of the abdomen and pelvis on 12/31/2011 which showed peritoneal  carcinomatosis and a 5.4 cm right adnexal cyst. She was also found to have ascites. She underwent bilateral thoracentesis and paracentesis.  Cytology from her pleural fluid showed metastatic carcinoma.  Dr.Khan was subsequently consulted. Transvaginal ultrasound was done on 01/02/2012. Patient was then seen by the gynecologic oncologist, Dr.Gehrig, on 01/05/2012. Neoadjuvant chemotherapy was recommended. A Port-A-Cath was placed in anticipation of chemotherapy on 01/06/2012. Following chemotherapy, she will likely be scheduled for a debulking procedure. She underwent her first course of chemotherapy on 01/07/2012. Active Problems:  Severe malnutrition in the context of acute illness  Seen by the dietitian 01/04/2012.  Supplements recommended.  Breast cancer  Maintained on tamoxifen. She is status post lumpectomy and radiation treatment.  Pure hypercholesterolemia  Maintained on atorvastatin.  Coronary artery disease  Maintained on aspirin and atorvastatin.  Malignant pleural effusion  Patient underwent a right thoracentesis on 12/29/2011 with clusters of cells suspicious for non-small cell carcinoma.  Her effusions rapidly reaccumulated and Dr. Laneta Simmers was subsequently consulted on 01/04/12 for consideration of placement of Pleurx catheter. Bilateral catheters were placed on 01/07/2012. We have setup a home health nurse to drain these daily for accumulation greater than 100 cc. Once catheter drainage is less than 100 cc, may be drained every other day.  Tachycardia  CT angiogram done on 12/28/2011 which ruled out pulmonary embolism. Improved after thoracentesis peer  Dyspnea  Multi-factorial but likely mostly due to pleural effusions as well as a component of diastolic dysfunction. She had improvement in her symptoms after thoracentesis.  HTN (hypertension)  Controlled on Coreg.  Ascites  Noted on CT scan of the abdomen and pelvis done on 12/31/2011. Subsequently underwent a  therapeutic/diagnostic paracentesis on 01/02/2012. Underwent a second paracentesis on 01/05/2012.  Syncopal episodes  Felt to be due to orthostasis or recurrent pleural effusions.   Procedures:  Left thoracentesis 12/28/2011:1400 mL of pleural fluid drained.  Right thoracentesis 12/29/2011:850 mL of pleural fluid drained.  Paracentesis 01/02/2012:1.1 L of bloody serous fluid drained.  Paracentesis 01/05/2012:800 cc of blood-tinged fluid drained.  Insertion of Port-A-Cath in the left subclavian vein, insertion of bilateral Pleurx catheters done by Dr. Laneta Simmers on 01/07/2012.  Consultations:  Dr. Rexanne Mano, cardiothoracic surgery  Dr. Koren Bound, PCCM  Dr. Drue Second, Oncology  Dr. Cleda Mccreedy, Gyn oncology  Discharge Exam: Filed Vitals:   01/08/12 0530  BP: 112/71  Pulse: 95  Temp: 98.1 F (36.7 C)  Resp: 18   Filed Vitals:   01/07/12 2050 01/07/12 2105 01/07/12 2155 01/08/12 0530  BP: 111/69 108/64 110/67 112/71  Pulse: 80 82 83 95  Temp: 98.9 F (37.2 C) 98.2 F (36.8 C) 98.1 F (36.7 C) 98.1 F (36.7 C)  TempSrc: Oral Oral Oral Oral  Resp: 18 20 18 18   Height:      Weight:    59.875 kg (132 lb)  SpO2:   96% 96%    Gen:  NAD Cardiovascular:  RRR, No M/R/G Respiratory: Lungs diminished Gastrointestinal: Abdomen soft, NT/ND with normal active bowel sounds. Extremities: No C/E/C   Discharge Instructions  Discharge Orders    Future Appointments: Provider: Department: Dept Phone: Center:   04/24/2012 2:00 PM [facility] MEDICAL ONCOLOGY [phone] None   04/24/2012 2:30 PM Victorino December, MD [facility] MEDICAL ONCOLOGY [phone] None     Future Orders Please Complete By Expires   Diet general      TREATMENT CONDITIONS      Comments:   Notify the MD for the following lab values: ANC < 1500, PLT < 100K, Hemoglobin < 8.5, Creatinine > 1.5, urine output < 200 ml prior to cisplatin.  If labs are  abnormal OR no lab data is available, MD must be notified and order obtained to begin chemotherapy.   Home Health      Scheduling Instructions:   Please have RN drain PleurX tubes (two) daily.   Questions: Responses:   To provide the following care/treatments RN    PT   Face-to-face encounter      Comments:   I Morgann Woodburn certify that this patient is under my care and that I, or a nurse practitioner or physician's assistant working with me, had a face-to-face encounter that meets the physician face-to-face encounter requirements with this patient on 01/08/2012. The encounter with the patient was in whole, or in part for the following medical condition(s) which is the primary reason for home health care (List medical condition): Widely metastatic ovarian cancer with malignant pleural effusions, status post PleurX catheter, deconditioning.   Questions: Responses:   The encounter with the patient was in whole, or in part, for the following medical condition, which is the primary reason for home health care Metastatic ovarian cancer, deconditioning, malignant pleural effusions status post PleurX   I certify that, based on my findings, the following services are medically necessary home health services Nursing    Physical therapy   My clinical findings support the need for the above services Leaving home exacerbates symptoms (pain, dyspnea, anxiety etc.)   Further, I certify that my clinical findings support that this patient is homebound due to: Shortness of Breath with activity   To provide the following care/treatments RN    PT   Increase activity  slowly      Call MD for:  temperature >100.4      Call MD for:  persistant nausea and vomiting      Call MD for:  severe uncontrolled pain          Medication List     As of 01/08/2012 11:41 AM    TAKE these medications         ALPRAZolam 0.25 MG tablet   Commonly known as: XANAX   Take 1 tablet (0.25 mg total) by mouth every 8 (eight)  hours as needed for anxiety.      aspirin 325 MG tablet   Take 325 mg by mouth daily.      atorvastatin 20 MG tablet   Commonly known as: LIPITOR   Take 20 mg by mouth daily.      carvedilol 25 MG tablet   Commonly known as: COREG   Take 25 mg by mouth 2 (two) times daily.      dexamethasone 4 MG tablet   Commonly known as: DECADRON   Take 2 tablets (8 mg total) by mouth 2 (two) times daily with a meal. Take two times a day starting the day after chemotherapy for 3 days.      EPIPEN 2-PAK 0.3 mg/0.3 mL Devi   Generic drug: EPINEPHrine   Inject 0.3 mg into the muscle.      HYDROcodone-acetaminophen 5-325 MG per tablet   Commonly known as: NORCO/VICODIN   Take 1-2 tablets by mouth every 4 (four) hours as needed.      LORazepam 0.5 MG tablet   Commonly known as: ATIVAN   Take 1 tablet (0.5 mg total) by mouth every 6 (six) hours as needed (Nausea or vomiting).      nitroGLYCERIN 0.4 MG SL tablet   Commonly known as: NITROSTAT   Place 0.4 mg under the tongue every 5 (five) minutes as needed. Chest pain      omeprazole 20 MG capsule   Commonly known as: PRILOSEC   Take 20 mg by mouth daily.      ondansetron 8 MG tablet   Commonly known as: ZOFRAN   Take 1 tablet (8 mg total) by mouth 2 (two) times daily. Take two times a day starting the day after chemo for 3 days. Then take two times a day as needed for nausea or vomiting.      oxybutynin 10 MG 24 hr tablet   Commonly known as: DITROPAN-XL   Take 10 mg by mouth daily.      Polyethyl Glycol-Propyl Glycol 0.4-0.3 % Soln   Place 1 drop into both eyes every morning.      prochlorperazine 10 MG tablet   Commonly known as: COMPAZINE   Take 1 tablet (10 mg total) by mouth every 6 (six) hours as needed (Nausea or vomiting).      prochlorperazine 25 MG suppository   Commonly known as: COMPAZINE   Place 1 suppository (25 mg total) rectally every 12 (twelve) hours as needed for nausea.      STOOL SOFTENER PO   Take 2 capsules  by mouth every morning.      tamoxifen 20 MG tablet   Commonly known as: NOLVADEX   Take 20 mg by mouth daily.      trimethoprim 100 MG tablet   Commonly known as: TRIMPEX   Take 100 mg by mouth daily.           Follow-up Information    Schedule an  appointment as soon as possible for a visit with KNAPP,[name], MD. (As needed)    Contact information:   [address] [place] [address] [phone]       Follow up with Drue Second, MD. (At your appointment time noted below)    Contact information:   [address] [place] [address] [phone]           The results of significant diagnostics from this hospitalization (including imaging, microbiology, ancillary and laboratory) are listed below for reference.    Significant Diagnostic Studies: Dg Chest 2 View  01/03/2012  *RADIOLOGY REPORT*  Clinical Data: 66 year old female with shortness of breath. History of breast cancer.  CHEST - 2 VIEW  Comparison: 12/31/2011 and earlier.  Findings: Continued moderate left pleural effusion, mildly increased.  Smaller right pleural effusion appears more stable. Associated patchy bibasilar opacity as before.  No pneumothorax or edema.  Stable cardiac size and mediastinal contours.  Sequelae of CABG.  Postoperative changes to the left chest wall. No acute osseous abnormality identified.  IMPRESSION: Continued bilateral pleural effusions.  The moderate effusion on the left shows some further increase since 12/31/2011.   Original Report Authenticated By: Erskine Speed, M.D.    Dg Chest 2 View  12/31/2011  *RADIOLOGY REPORT*  Clinical Data: Fever and cough for 1 week  CHEST - 2 VIEW  Comparison: 12/30/2011  Findings: Left greater right bilateral effusions and associated lung base opacity is stable.  The lung base opacities likely atelectasis although infiltrate is possible.  The lungs are otherwise clear with no pulmonary edema.  Changes from CABG surgery are stable.  No  mediastinal or hilar masses or adenopathy.  The heart is normal in size.  IMPRESSION: No change from the previous day's study.  Left larger than the right pleural effusions with associated lung base opacity, most likely atelectasis, although infiltrate possible.   Original Report Authenticated By: Amie Portland, M.D.    Dg Chest 2 View  12/30/2011  *RADIOLOGY REPORT*  Clinical Data: Pleural effusions, shortness of breath  CHEST - 2 VIEW  Comparison: 12/29/2011  Findings: The cardiac shadow is stable.  Postoperative changes are again seen.  Bilateral pleural effusions left greater than right are again noted and given the change in technique likely stable. No pneumothorax is noted.  IMPRESSION: Stable bilateral pleural effusions, left greater than right   Original Report Authenticated By: Alcide Clever, M.D.    Ct Angio Chest Pe W/cm &/or Wo Cm  12/28/2011  *RADIOLOGY REPORT*  Clinical Data: Shortness of breath and tachycardia.  History breast cancer and coronary artery disease.  CT ANGIOGRAPHY CHEST  Technique:  Multidetector CT imaging of the chest using the standard protocol during bolus administration of intravenous contrast. Multiplanar reconstructed images including MIPs were obtained and reviewed to evaluate the vascular anatomy.  Contrast: 80mL OMNIPAQUE IOHEXOL 350 MG/ML SOLN  Comparison: Chest x-ray 12/28/2011.  Findings: Pulmonary arterial opacification is satisfactory.  The study is mildly degraded by patient breathing motion.  No focal filling defects are evident to suggest pulmonary embolus.  Large bilateral pleural effusions are present, left greater than right.  There is associated collapse of proximally half the left lower lobe.  There is a linear collapse in the left lower lobe and left upper lobe are associated with occluded airways, likely representing mucous plugging.  The heart is mildly enlarged.  Extensive coronary artery calcifications are evident.  The patient is status post median  sternotomy for CABG.  The bone  windows demonstrate exaggeration of the thoracic kyphosis. 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  IMPRESSION:  1.  New large bilateral pleural effusions. 2.  No definite pulmonary embolus.  The study is mildly degraded by patient breathing motion. 3.  Areas of linear atelectasis in the left upper and lower lobe are associated with occluded bronchi, likely representing mucus plugging. 4.  Coronary artery disease status post CABG.   Original Report Authenticated By: Marin Roberts, M.D.    US Transvaginal Non-ob  01/02/2012  *RADIOLOGY REPORT*  Clinical Data: Adnexal mass, peritoneal carcinomatosis  TRANSABDOMINAL AND TRANSVAGINAL ULTRASOUND OF PELVIS Technique:  Both transabdominal and transvaginal ultrasound examinations of the pelvis were performed. Transabdominal technique was performed for global imaging of the pelvis including uterus, ovaries, adnexal regions, and pelvic cul-de-sac.  It was necessary to proceed with endovaginal exam following the transabdominal exam to visualize the right ovary.  Comparison:  None  Findings:  Uterus: Surgically absent.  Right ovary:  Complex cystic right adnexal mass measuring 3.1 x 5.7 x 4.5 cm with surrounding rim of suspected ovarian tissue.  Left ovary: Not visualized transabdominally/transvaginally. Correlate with surgical history.  Other findings: Moderate pelvic ascites with internal echoes.  IMPRESSION: 5.7 cm complex cystic right ovarian lesion.  Given the associated findings on CT, this appearance is worrisome for primary ovarian neoplasm.  Status post hysterectomy.  Left ovary is not discretely visualized.  Correlate with surgical history.   Original Report Authenticated By: Charline Bills, M.D.    US Pelvis Complete  01/02/2012  *RADIOLOGY REPORT*  Clinical Data: Adnexal mass, peritoneal carcinomatosis  TRANSABDOMINAL AND TRANSVAGINAL ULTRASOUND OF PELVIS Technique:  Both transabdominal and transvaginal  ultrasound examinations of the pelvis were performed. Transabdominal technique was performed for global imaging of the pelvis including uterus, ovaries, adnexal regions, and pelvic cul-de-sac.  It was necessary to proceed with endovaginal exam following the transabdominal exam to visualize the right ovary.  Comparison:  None  Findings:  Uterus: Surgically absent.  Right ovary:  Complex cystic right adnexal mass measuring 3.1 x 5.7 x 4.5 cm with surrounding rim of suspected ovarian tissue.  Left ovary: Not visualized transabdominally/transvaginally. Correlate with surgical history.  Other findings: Moderate pelvic ascites with internal echoes.  IMPRESSION: 5.7 cm complex cystic right ovarian lesion.  Given the associated findings on CT, this appearance is worrisome for primary ovarian neoplasm.  Status post hysterectomy.  Left ovary is not discretely visualized.  Correlate with surgical history.   Original Report Authenticated By: Charline Bills, M.D.    Ct Abdomen Pelvis W Contrast  12/31/2011  *RADIOLOGY REPORT*  Clinical Data: Pleural effusion, possible gynecologic malignancy.  CT ABDOMEN AND PELVIS WITH CONTRAST  Technique:  Multidetector CT imaging of the abdomen and pelvis was performed following the standard protocol during bolus administration of intravenous contrast.  Contrast: OMNIPAQUE IOHEXOL 300 MG/ML  SOLN  Comparison: 12/28/2011 chest CT  Findings: Bilateral pleural effusions, left greater than right. Associated airspace consolidations.  Heart size within normal limits.  Coronary artery calcification.  Unremarkable liver, spleen, pancreas, biliary system, adrenal glands.  Symmetric renal enhancement.  No hydronephrosis or hydroureter.  No bowel obstruction.  No CT evidence for colitis.  There is a moderate amount of ascites. Peritoneal carcinomatosis, with the largest conglomerate in the left paracolic gutter, measuring approximately 7.7 x 6.2 cm and displacing the small bowel centrally.   No free intraperitoneal air.  No lymphadenopathy.  Thin-walled bladder.  Uterus not identified.  5.4 cm right adnexal cyst.  Enlarged  inguinal lymph nodes, measuring up to 13 mm on the left.  There is scattered atherosclerotic calcification of the aorta and its branches. No aneurysmal dilatation.  Multilevel degenerative changes without acute osseous finding. Multiple subcutaneous rounded densities are nonspecific and may reflect sequelae of prior injection sites, however metastases not excluded.  IMPRESSION: Bilateral pleural effusions and associated consolidations are partially imaged.  Peritoneal carcinomatosis and a moderate amount of ascites.  5.4 cm right adnexal cyst is indeterminate.  Inguinal lymphadenopathy.  Nonspecific anterior subcutaneous nodules. Favored to reflect sequelae of prior injection sites, however metastases not excluded.   Original Report Authenticated By: Jearld Lesch, M.D.    US Paracentesis  01/05/2012  *RADIOLOGY REPORT*  Clinical Data: Abdominal distension and ascites  ULTRASOUND GUIDED PARACENTESIS  Comparison:  Previous paracentesis  An ultrasound guided paracentesis was thoroughly discussed with the patient and questions answered.  The benefits, risks, alternatives and complications were also discussed.  The patient understands and wishes to proceed with the procedure.  Written consent was obtained.  Ultrasound was performed to localize and mark an adequate pocket of fluid in the right lower quadrant of the abdomen.  The area was then prepped and draped in the normal sterile fashion.  1% Lidocaine was used for local anesthesia.  Under ultrasound guidance a 19 gauge Yueh catheter was introduced.  Paracentesis was performed.  The catheter was removed and a dressing applied.  Complications:  None immediate  Findings:  A total of approximately 800 ml of blood tinged fluid was removed.  A fluid sample was not sent for laboratory analysis.  IMPRESSION: Successful ultrasound guided  paracentesis yielding 800 ml of ascites.  Read by Brayton El PA-C   Original Report Authenticated By: Richarda Overlie, M.D.    US Paracentesis  01/02/2012  *RADIOLOGY REPORT*  Clinical Data: New onset ascites and pleural effusions.  History of breast cancer.  Now with abdominal abnormalities concerning for gynecological malignancy.  Request has been made for therapeutic and diagnostic paracentesis.  ULTRASOUND GUIDED PARACENTESIS  Comparison:  CT imaging of the abdomen and pelvis  An ultrasound guided paracentesis was thoroughly discussed with the patient and questions answered.  The benefits, risks, alternatives and complications were also discussed.  The patient understands and wishes to proceed with the procedure.  Written consent was obtained.  Ultrasound was performed to localize and mark an adequate pocket of fluid in the right lower quadrant of the abdomen.  The area was then prepped and draped in the normal sterile fashion.  1% Lidocaine was used for local anesthesia.  Under ultrasound guidance a 19 gauge Yueh catheter was introduced.  Paracentesis was performed.  The catheter was removed and a dressing applied.  Complications:  None immediate  Findings:  A total of approximately 1.1 liters of bloody serous fluid was removed.  A fluid sample was sent for laboratory analysis.  IMPRESSION: Successful ultrasound guided paracentesis yielding 1.1 liters of ascites.  Read by: Anselm Pancoast, P.A.-C   Original Report Authenticated By: Richarda Overlie, M.D.    Dg Chest Port 1 View  01/06/2012  *RADIOLOGY REPORT*  Clinical Data: Port-A-Cath and Pleurx catheter placement.  PORTABLE CHEST - 1 VIEW  Comparison: 01/06/2012 at 6:39 a.m.  Findings: Left-sided Port-A-Cath noted with tip projecting over the SVC.  Bilateral pleural drainage catheters noted, with significant reduction in both pleural effusions.  Moderate left and trace right pleural effusions persist with associated atelectasis.  No pneumothorax observed.   CABG markers noted. A small amount of  retrocardiac lucency on the left probably represents a small amount of aerated lung in this vicinity.  IMPRESSION:  1.  Bilateral pleural drainage catheter placement with significant reduction in both pleural effusions.  Moderate left and trace right pleural effusions persist. 2.  Port-A-Cath tip projects over the SVC.  No pneumothorax observed. 3.  Retrocardiac lucency likely represents a small aerated subsegmental section of lung.   Original Report Authenticated By: Gaylyn Rong, M.D.    Dg Chest Port 1 View  01/06/2012  *RADIOLOGY REPORT*  Clinical Data: Pleural effusion  PORTABLE CHEST - 1 VIEW  Comparison: 01/03/2012  Findings: Bilateral pleural effusions left greater than right are not significantly changed allowing for changes in patient positioning.  There is associated volume loss.  Normal heart size. No pneumothorax.  IMPRESSION: Stable bilateral pleural effusions left greater than right.   Original Report Authenticated By: Jolaine Click, M.D.    Dg Chest Port 1 View  12/29/2011  *RADIOLOGY REPORT*  Clinical Data: Follow-up right thoracentesis.  PORTABLE CHEST - 1 VIEW  Comparison: Radiographs and CT 12/28/2011.  Findings: 1151 hours.  The right pleural effusion has significantly decreased in volume.  There is a persistent moderate-sized left pleural effusion.  No pneumothorax is seen.  There is improved aeration of the right lung base. The heart size and mediastinal contours are stable status post CABG.  IMPRESSION: Significantly improved right pleural effusion following thoracentesis.  No evidence of pneumothorax.   Original Report Authenticated By: Carey Bullocks, M.D.    Dg Chest Port 1 View  12/28/2011  *RADIOLOGY REPORT*  Clinical Data: Post thoracentesis  PORTABLE CHEST - 1 VIEW  Comparison: Portable exam 1502 hours compared to 12/28/2011 at 0918 hours  Findings: Normal heart size post CABG. Mediastinal contours and pulmonary vascularity normal.  Bibasilar effusions atelectasis, decreased on the left since earlier study. No pneumothorax identified. Upper lungs clear. Bones unremarkable.  IMPRESSION: No pneumothorax following thoracentesis. Bibasilar effusions atelectasis with decrease in left pleural effusion since earlier study.   Original Report Authenticated By: Ulyses Southward, M.D.    Dg Chest Port 1 View  12/28/2011  *RADIOLOGY REPORT*  Clinical Data: Shortness of breath.  PORTABLE CHEST - 1 VIEW  Comparison: Two-view chest 09/21/2011.  Findings: The heart is obscured by a large left pleural effusion and associated airspace disease.  A smaller right pleural effusion is slightly worse than on the prior exam.  Right basilar airspace disease has progressed.  Moderate pulmonary vascular congestion is evident.  IMPRESSION:  1.  Significant increase and a now large left pleural effusion. 2.  Slight increase in a small right pleural effusion. 3.  Progressive bilateral airspace disease.  While this may represent atelectasis, infection is not excluded. 3.  Moderate pulmonary vascular congestion is new.   Original Report Authenticated By: Marin Roberts, M.D.    Dg Fluoro Guide Cv Line-no Report  01/06/2012  CLINICAL DATA: Port a  catheter placement   FLOURO GUIDE CV LINE  Fluoroscopy was utilized by the requesting physician.  No radiographic  interpretation.     Dg C-arm 1-60 Min-no Report  01/06/2012  CLINICAL DATA: pleurx catheter placement x 2   C-ARM 1-60 MINUTES  Fluoroscopy was utilized by the requesting physician.  No radiographic  interpretation.      Microbiology: Recent Results (from the past 240 hour(s))  BODY FLUID CULTURE     Status: Normal   Collection Time   01/02/12 10:29 AM      Component Value Range Status Comment  Specimen Description PERITONEAL   Final    Special Requests NONE   Final    Gram Stain     Final    Value: NO WBC SEEN     NO ORGANISMS SEEN   Culture NO GROWTH 3 DAYS   Final    Report Status 01/06/2012  FINAL   Final   SURGICAL PCR SCREEN     Status: Normal   Collection Time   01/05/12  7:17 PM      Component Value Range Status Comment   MRSA, PCR NEGATIVE  NEGATIVE Final    Staphylococcus aureus NEGATIVE  NEGATIVE Final      Labs: Basic Metabolic Panel:  Lab 01/07/12 1610 01/04/12 0506  NA 131* --  K 4.3 --  CL 96 --  CO2 27 --  GLUCOSE 120* --  BUN 19 --  CREATININE 0.61 0.72  CALCIUM 8.1* --  MG -- --  PHOS -- --   Liver Function Tests:  Lab 01/07/12 1746  AST 16  ALT 11  ALKPHOS 64  BILITOT 0.1*  PROT 5.5*  ALBUMIN 1.7*   CBC:  Lab 01/07/12 1746  WBC 12.4*  NEUTROABS 10.4*  HGB 10.1*  HCT 31.7*  MCV 81.3  PLT 551*    BNP (last 3 results)  Basename 12/28/11 1109  PROBNP 828.2*    Time coordinating discharge: 45 minutes.  Signed:  Johany Hansman  Pager [phone] [facility] 01/08/2012, 11:41 AM

## 2012-01-10 ENCOUNTER — Encounter (HOSPITAL_COMMUNITY)
Admission: RE | Admit: 2012-01-10 | Discharge: 2012-01-10 | Disposition: A | Discharge status: Home / Self Care | Payer: BC Managed Care – PPO | Source: Ambulatory Visit | Attending: Oncology | Admitting: Oncology

## 2012-01-10 ENCOUNTER — Encounter: Payer: Self-pay | Admitting: Oncology

## 2012-01-10 ENCOUNTER — Other Ambulatory Visit: Payer: Self-pay | Admitting: Emergency Medicine

## 2012-01-10 ENCOUNTER — Telehealth: Payer: Self-pay | Admitting: *Deleted

## 2012-01-10 ENCOUNTER — Other Ambulatory Visit (HOSPITAL_BASED_OUTPATIENT_CLINIC_OR_DEPARTMENT_OTHER): Payer: BC Managed Care – PPO

## 2012-01-10 ENCOUNTER — Ambulatory Visit (HOSPITAL_BASED_OUTPATIENT_CLINIC_OR_DEPARTMENT_OTHER): Payer: BC Managed Care – PPO | Admitting: Oncology

## 2012-01-10 ENCOUNTER — Ambulatory Visit (HOSPITAL_COMMUNITY)
Admission: RE | Admit: 2012-01-10 | Discharge: 2012-01-10 | Disposition: A | Discharge status: Home / Self Care | Payer: BC Managed Care – PPO | Source: Ambulatory Visit | Attending: Internal Medicine | Admitting: Internal Medicine

## 2012-01-10 ENCOUNTER — Ambulatory Visit (HOSPITAL_BASED_OUTPATIENT_CLINIC_OR_DEPARTMENT_OTHER): Payer: BC Managed Care – PPO

## 2012-01-10 ENCOUNTER — Other Ambulatory Visit (HOSPITAL_COMMUNITY): Payer: BC Managed Care – PPO

## 2012-01-10 VITALS — BP 97/67 | HR 82 | Temp 98.1°F | Resp 20 | Ht 66.0 in | Wt 131.7 lb

## 2012-01-10 VITALS — BP 124/74 | HR 69 | Temp 97.9°F | Resp 20

## 2012-01-10 DIAGNOSIS — D649 Anemia, unspecified: Secondary | ICD-10-CM | POA: Insufficient documentation

## 2012-01-10 DIAGNOSIS — E860 Dehydration: Secondary | ICD-10-CM

## 2012-01-10 DIAGNOSIS — C569 Malignant neoplasm of unspecified ovary: Secondary | ICD-10-CM | POA: Insufficient documentation

## 2012-01-10 DIAGNOSIS — C786 Secondary malignant neoplasm of retroperitoneum and peritoneum: Secondary | ICD-10-CM

## 2012-01-10 DIAGNOSIS — C50919 Malignant neoplasm of unspecified site of unspecified female breast: Secondary | ICD-10-CM

## 2012-01-10 DIAGNOSIS — D0590 Unspecified type of carcinoma in situ of unspecified breast: Secondary | ICD-10-CM

## 2012-01-10 DIAGNOSIS — E46 Unspecified protein-calorie malnutrition: Secondary | ICD-10-CM

## 2012-01-10 DIAGNOSIS — Z5189 Encounter for other specified aftercare: Secondary | ICD-10-CM

## 2012-01-10 DIAGNOSIS — Y849 Medical procedure, unspecified as the cause of abnormal reaction of the patient, or of later complication, without mention of misadventure at the time of the procedure: Secondary | ICD-10-CM | POA: Insufficient documentation

## 2012-01-10 DIAGNOSIS — T82598A Other mechanical complication of other cardiac and vascular devices and implants, initial encounter: Secondary | ICD-10-CM | POA: Insufficient documentation

## 2012-01-10 LAB — COMPREHENSIVE METABOLIC PANEL (CC13)
ALT: 27 U/L (ref 0–55)
AST: 39 U/L — ABNORMAL HIGH (ref 5–34)
Albumin: 1.9 g/dL — ABNORMAL LOW (ref 3.5–5.0)
Calcium: 8.2 mg/dL — ABNORMAL LOW (ref 8.4–10.4)
Chloride: 99 mEq/L (ref 98–107)
Potassium: 5.1 mEq/L (ref 3.5–5.1)
Sodium: 133 mEq/L — ABNORMAL LOW (ref 136–145)
Total Protein: 5.7 g/dL — ABNORMAL LOW (ref 6.4–8.3)

## 2012-01-10 LAB — CBC WITH DIFFERENTIAL/PLATELET
BASO%: 0.2 % (ref 0.0–2.0)
Basophils Absolute: 0 10*3/uL (ref 0.0–0.1)
EOS%: 0.1 % (ref 0.0–7.0)
HGB: 9.4 g/dL — ABNORMAL LOW (ref 11.6–15.9)
MCH: 26.1 pg (ref 25.1–34.0)
MCHC: 32.2 g/dL (ref 31.5–36.0)
RDW: 15.1 % — ABNORMAL HIGH (ref 11.2–14.5)
lymph#: 0.4 10*3/uL — ABNORMAL LOW (ref 0.9–3.3)

## 2012-01-10 LAB — ABO/RH: ABO/RH(D): A POS

## 2012-01-10 MED ORDER — FUROSEMIDE 10 MG/ML IJ SOLN
20.0000 mg | Freq: Once | INTRAMUSCULAR | Status: AC
  Administered 2012-01-10: 20 mg via INTRAVENOUS

## 2012-01-10 MED ORDER — SODIUM CHLORIDE 0.9 % IJ SOLN
3.0000 mL | INTRAMUSCULAR | Status: DC | PRN
  Filled 2012-01-10: qty 10

## 2012-01-10 MED ORDER — ACETAMINOPHEN 325 MG PO TABS
650.0000 mg | ORAL_TABLET | Freq: Once | ORAL | Status: AC
  Administered 2012-01-10: 650 mg via ORAL

## 2012-01-10 MED ORDER — HEPARIN SOD (PORK) LOCK FLUSH 100 UNIT/ML IV SOLN
500.0000 [IU] | Freq: Every day | INTRAVENOUS | Status: AC | PRN
  Administered 2012-01-10: 500 [IU]
  Filled 2012-01-10: qty 5

## 2012-01-10 MED ORDER — HEPARIN SOD (PORK) LOCK FLUSH 100 UNIT/ML IV SOLN
250.0000 [IU] | INTRAVENOUS | Status: DC | PRN
  Filled 2012-01-10: qty 5

## 2012-01-10 MED ORDER — SODIUM CHLORIDE 0.9 % IV SOLN
250.0000 mL | Freq: Once | INTRAVENOUS | Status: AC
  Administered 2012-01-10: 250 mL via INTRAVENOUS

## 2012-01-10 MED ORDER — SODIUM CHLORIDE 0.9 % IJ SOLN
10.0000 mL | INTRAMUSCULAR | Status: AC | PRN
  Administered 2012-01-10: 10 mL
  Filled 2012-01-10: qty 10

## 2012-01-10 MED ORDER — DIPHENHYDRAMINE HCL 25 MG PO CAPS
25.0000 mg | ORAL_CAPSULE | Freq: Once | ORAL | Status: AC
  Administered 2012-01-10: 25 mg via ORAL

## 2012-01-10 MED ORDER — PEGFILGRASTIM INJECTION 6 MG/0.6ML
6.0000 mg | Freq: Once | SUBCUTANEOUS | Status: AC
  Administered 2012-01-10: 6 mg via SUBCUTANEOUS

## 2012-01-10 MED ORDER — ZOLPIDEM TARTRATE 10 MG PO TABS: 10.0000 mg | ORAL_TABLET | Freq: Every evening | ORAL | Status: DC | PRN

## 2012-01-10 NOTE — Telephone Encounter (Signed)
desk nurse will draw from port patient just needs labs

## 2012-01-10 NOTE — Telephone Encounter (Signed)
Add on patient for the 01-12-2012 starting at 10:30am

## 2012-01-10 NOTE — Patient Instructions (Addendum)
IVF today, 11/26, 11/27  Blood transfusion on 11/25  ambien for sleeping  See Dr. Welton Flakes on 11/27 in follow up with labs

## 2012-01-10 NOTE — Progress Notes (Signed)
OFFICE PROGRESS NOTE  CC  KNAPP,[name], MD [address] [place] [address] Dr. Emelia Loron Dr. 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  PRIOR THERAPY: #1. S/P left breast lumpectomy in March 2011 after she had a screen detected 1.0 cm ER+, PR+, intermediate grade DCIS. Patient had 3 sentinel biopsied all of them were negative for metastatic disease. Patient underwent radiation therapy between 06/05/2009 through 07/02/2009. She was then begun on tamoxifen 20 mg daily in August 2011.  #2 new diagnosis of gynecologic malignancy presenting with abdominal mass and pleural effusions. Patient was recently hospitalized with shortness of breath she was discovered to have malignant pleural effusion she is status post Pleurx catheter is. She also had malignant ascites she's had several paracentesis procedures performed during her hospitalization. During her hospitalization she was seen by gynecologic oncology. She also went on to receive chemotherapy consisting of Taxol and carboplatinum this is her cycle 1. Her chemotherapy will be given every 21 days with day 2 Neulasta.   CURRENT THERAPY:Patient is status post cycle 1 of Taxol and carboplatinum  INTERVAL HISTORY: [name] [name] 66 y.o. female returns for follow up after being discharged from the hospital and being recently diagnosed with gynecologic malignancy most likely ovarian carcinoma. Patient presented with shortness of breath and pleural effusions as well as  ascites, with peritoneal carcinomatosis. Patient overall feels very weak tired and fatigued. She did receive first cycle of her chemotherapy on 01/07/2012. She was then discharged to home on 11/23. Clinically she feels very tired weak fatigued she has occasional nausea. She does have Pleurx catheter is in place which she does have a home health nurse coming in to drain this is helping her  significantly. We did have trouble using her Port-A-Cath today.  MEDICAL HISTORY: Past Medical History  Diagnosis Date  . Interstitial cystitis     on chronic antibiotics  . Hyperlipidemia   . Frequent UTI     on prophylaxis  . 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 CAD (coronary artery disease)     a. s/p CABG 7/13;   b.  LHC 12/01/11:  pLAD 70%, mLAD 40%, CFX 40-50% prior to takeoff of the OM2, oRCA occluded, mid vessel filled via R->R collaterals and distal vessel filled by L->R collaterals, S-OM1/OM2 (small and diffusely dz) with mid 90% stenosis, 90% at OM1 anastomotic site, continuation of OM2 occluded, S-Dx patent, S-PDA occluded, L-LAD ok, EF 55-65%  => Med Rx rec.  [name] Hx of echocardiogram     Echo 5/13: EF 60-65%, grade 2 diastolic dysfunction  . Hypercholesterolemia   . Pleural effusion 12/28/11  . GERD (gastroesophageal reflux disease)   . Arthritis     "just a little; lower back" (12/28/11)  . Gout attack 08/2011    related to "stress post OHS"  . Depression   . Breast cancer     "left"; on Tamoxifen  . S/P thoracentesis 12/28/11    "for pleural effusion" (12/28/2011)  . Ovarian cancer     ALLERGIES:  is allergic to codeine; isosorbide; other; phenothiazines; talwin; and yellow jacket venom.  MEDICATIONS:  Current Outpatient Prescriptions  Medication Sig Dispense Refill  . ALPRAZolam (XANAX) 0.25 MG tablet Take 1 tablet (0.25 mg total) by mouth every 8 (eight) hours as needed for anxiety.  30 tablet  0  . aspirin 325 MG tablet Take 325 mg by mouth daily.      [name]  atorvastatin (LIPITOR) 20 MG tablet Take 20 mg by mouth daily.      . carvedilol (COREG) 25 MG tablet Take 25 mg by mouth 2 (two) times daily.      [name] dexamethasone (DECADRON) 4 MG tablet Take 2 tablets (8 mg total) by mouth 2 (two) times daily with a meal. Take two times a day starting the day after chemotherapy for 3 days.  30 tablet  1  . Docusate Calcium (STOOL SOFTENER PO) Take 2 capsules by mouth every morning.       [name] HYDROcodone-acetaminophen (NORCO/VICODIN) 5-325 MG per tablet Take 1-2 tablets by mouth every 4 (four) hours as needed.  30 tablet  0  . LORazepam (ATIVAN) 0.5 MG tablet Take 1 tablet (0.5 mg total) by mouth every 6 (six) hours as needed (Nausea or vomiting).  30 tablet  0  . omeprazole (PRILOSEC) 20 MG capsule Take 20 mg by mouth daily.      . ondansetron (ZOFRAN) 8 MG tablet Take 1 tablet (8 mg total) by mouth 2 (two) times daily. Take two times a day starting the day after chemo for 3 days. Then take two times a day as needed for nausea or vomiting.  30 tablet  1  . oxybutynin (DITROPAN-XL) 10 MG 24 hr tablet Take 10 mg by mouth daily.      Bertram Gala Glycol-Propyl Glycol 0.4-0.3 % SOLN Place 1 drop into both eyes every morning.       . tamoxifen (NOLVADEX) 20 MG tablet Take 20 mg by mouth daily.      [name] trimethoprim (TRIMPEX) 100 MG tablet Take 100 mg by mouth daily.      [name] EPINEPHrine (EPIPEN 2-PAK) 0.3 mg/0.3 mL DEVI Inject 0.3 mg into the muscle.       . nitroGLYCERIN (NITROSTAT) 0.4 MG SL tablet Place 0.4 mg under the tongue every 5 (five) minutes as needed. Chest pain      . prochlorperazine (COMPAZINE) 10 MG tablet Take 1 tablet (10 mg total) by mouth every 6 (six) hours as needed (Nausea or vomiting).  30 tablet  1  . prochlorperazine (COMPAZINE) 25 MG suppository Place 1 suppository (25 mg total) rectally every 12 (twelve) hours as needed for nausea.  12 suppository  3   No current facility-administered medications for this visit.   Facility-Administered Medications Ordered in Other Visits  Medication Dose Route Frequency Provider Last Rate Last Dose  . influenza  inactive virus vaccine (FLUZONE/FLUARIX) injection 0.5 mL  0.5 mL Intramuscular Once Joselyn Arrow, MD        SURGICAL HISTORY:  Past Surgical History  Procedure Date  . Breast lumpectomy 04/2009    left  . Cesarean section 1973; 1976  . Muscle release 1960    L neck and chest.; "when I was 13; pneumonia settled in my  left neck"  . Coronary artery bypass graft 08/18/2011    Procedure: CORONARY ARTERY BYPASS GRAFTING (CABG);  Surgeon: Alleen Borne, MD;  Location: [facility] OR;  Service: Open Heart Surgery;  Laterality: N/A;  Coronary Artery Bypass Graft times five utilizing the left internal mammary artery and the left greater saphenous vein harvested endoscopically.  . Abdominal hysterectomy 1976  . Appendectomy 1976  . Portacath placement 01/06/2012    Procedure: INSERTION PORT-A-CATH;  Surgeon: Alleen Borne, MD;  Location: [facility];  Service: Thoracic;  Laterality: Left;  . Chest tube insertion 01/06/2012    Procedure: INSERTION PLEURAL DRAINAGE CATHETER;  Surgeon: Alleen Borne, MD;  Location: [facility] OR;  Service: Thoracic;  Laterality: Bilateral;    REVIEW OF SYSTEMS:  Pertinent items are noted in HPI.   PHYSICAL EXAMINATION: General appearance: alert, cooperative, appears stated age and no distress Head: Normocephalic, without obvious abnormality, atraumatic Neck: no adenopathy, no carotid bruit, no JVD, supple, symmetrical, trachea midline and thyroid not enlarged, symmetric, no tenderness/mass/nodules Lymph nodes: Cervical, supraclavicular, and axillary nodes normal. Resp: clear to auscultation bilaterally and normal percussion bilaterally Back: symmetric, no curvature. ROM normal. No CVA tenderness. Cardio: regular rate and rhythm, S1, S2 normal, no murmur, click, rub or gallop and normal apical impulse GI: soft, non-tender; bowel sounds normal; no masses,  no organomegaly Extremities: extremities normal, atraumatic, no cyanosis or edema Neurologic: Alert and oriented X 3, normal strength and tone. Normal symmetric reflexes. Normal coordination and gait Bilateral Breast Exam: no masses or nipple discharge, no skin changes, left breast has a well healaed scar, no nodularity or masses. ECOG PERFORMANCE STATUS: 0 - Asymptomatic  Blood pressure 97/67, pulse 82, temperature 98.1 F (36.7 C), temperature source  Oral, resp. rate 20, height 5\' 6"  (1.676 m), weight 131 lb 11.2 oz (59.739 kg).  LABORATORY DATA: Lab Results  Component Value Date   WBC 12.4* 01/07/2012   HGB 10.1* 01/07/2012   HCT 31.7* 01/07/2012   MCV 81.3 01/07/2012   PLT 551* 01/07/2012      Chemistry      Component Value Date/Time   NA 131* 01/07/2012 1746   NA 141 12/17/2011 1144   K 4.3 01/07/2012 1746   CL 96 01/07/2012 1746   CO2 27 01/07/2012 1746   BUN 19 01/07/2012 1746   BUN 15 12/17/2011 1144   CREATININE 0.61 01/07/2012 1746      Component Value Date/Time   CALCIUM 8.1* 01/07/2012 1746   ALKPHOS 64 01/07/2012 1746   AST 16 01/07/2012 1746   ALT 11 01/07/2012 1746   BILITOT 0.1* 01/07/2012 1746       RADIOGRAPHIC STUDIES:  No results found.  ASSESSMENT: 66 year old female with:  #1 DCIS currently on tamoxifen no evidence of breast cancer recurrence.  #2 diagnosis of gynecological malignancy/perineal carcinomatosis. Patient is status post cycle 1 of Taxol and carboplatinum during her recent hospitalization.   3 bilateral pleural effusions status post Pleurx catheter placement.  #4 peroneal ascites patient is status post paracentesis.  #5 anemia   PLAN:   #1 we will give patient IV fluids today. She will also receive blood transfusions.  #2 protein malnutrition I have encouraged her to take oral supplements including Ensure or boost as well as [employer] whatever she can tolerate.  #3 for pain she is on pain medications.  #4 for anxiety she is taking lorazepam which is helping. For insomnia she is receiving Ambien that is helping her as well.  All questions were answered. The patient knows to call the clinic with any problems, questions or concerns. We can certainly see the patient much sooner if necessary.  I spent 40 minutes counseling the patient face to face. The total time spent in the appointment was 30 minutes.    Drue Second, MD Medical/Oncology [facility] [phone] (beeper) [phone] (Office)  01/10/2012, 12:21 PM

## 2012-01-10 NOTE — Procedures (Signed)
Port easily aspirates and flushes.  Contrast injection was negative for development of fibrin sheath.  Port is ready for immediate use.

## 2012-01-10 NOTE — Patient Instructions (Addendum)
Blood Transfusion Information WHAT IS A BLOOD TRANSFUSION? A transfusion is the replacement of blood or some of its parts. Blood is made up of multiple cells which provide different functions.  Red blood cells carry oxygen and are used for blood loss replacement.  White blood cells fight against infection.  Platelets control bleeding.  Plasma helps clot blood.  Other blood products are available for specialized needs, such as hemophilia or other clotting disorders. BEFORE THE TRANSFUSION  Who gives blood for transfusions?   You may be able to donate blood to be used at a later date on yourself (autologous donation).  Relatives can be asked to donate blood. This is generally not any safer than if you have received blood from a stranger. The same precautions are taken to ensure safety when a relative's blood is donated.  Healthy volunteers who are fully evaluated to make sure their blood is safe. This is blood bank blood. Transfusion therapy is the safest it has ever been in the practice of medicine. Before blood is taken from a donor, a complete history is taken to make sure that person has no history of diseases nor engages in risky social behavior (examples are intravenous drug use or sexual activity with multiple partners). The donor's travel history is screened to minimize risk of transmitting infections, such as malaria. The donated blood is tested for signs of infectious diseases, such as HIV and hepatitis. The blood is then tested to be sure it is compatible with you in order to minimize the chance of a transfusion reaction. If you or a relative donates blood, this is often done in anticipation of surgery and is not appropriate for emergency situations. It takes many days to process the donated blood. RISKS AND COMPLICATIONS Although transfusion therapy is very safe and saves many lives, the main dangers of transfusion include:   Getting an infectious disease.  Developing a  transfusion reaction. This is an allergic reaction to something in the blood you were given. Every precaution is taken to prevent this. The decision to have a blood transfusion has been considered carefully by your caregiver before blood is given. Blood is not given unless the benefits outweigh the risks. AFTER THE TRANSFUSION  Right after receiving a blood transfusion, you will usually feel much better and more energetic. This is especially true if your red blood cells have gotten low (anemic). The transfusion raises the level of the red blood cells which carry oxygen, and this usually causes an energy increase.  The nurse administering the transfusion will monitor you carefully for complications. HOME CARE INSTRUCTIONS  No special instructions are needed after a transfusion. You may find your energy is better. Speak with your caregiver about any limitations on activity for underlying diseases you may have. SEEK MEDICAL CARE IF:   Your condition is not improving after your transfusion.  You develop redness or irritation at the intravenous (IV) site. SEEK IMMEDIATE MEDICAL CARE IF:  Any of the following symptoms occur over the next 12 hours:  Shaking chills.  You have a temperature by mouth above 102 F (38.9 C), not controlled by medicine.  Chest, back, or muscle pain.  People around you feel you are not acting correctly or are confused.  Shortness of breath or difficulty breathing.  Dizziness and fainting.  You get a rash or develop hives.  You have a decrease in urine output.  Your urine turns a dark color or changes to pink, red, or brown. Any of the following   symptoms occur over the next 10 days:  You have a temperature by mouth above 102 F (38.9 C), not controlled by medicine.  Shortness of breath.  Weakness after normal activity.  The white part of the eye turns yellow (jaundice).  You have a decrease in the amount of urine or are urinating less often.  Your  urine turns a dark color or changes to pink, red, or brown. Document Released: 01/30/2000 Document Revised: 04/26/2011 Document Reviewed: 09/18/2007 ExitCare Patient Information 2013 [employer].  

## 2012-01-11 ENCOUNTER — Ambulatory Visit: Payer: BC Managed Care – PPO

## 2012-01-11 ENCOUNTER — Other Ambulatory Visit (HOSPITAL_BASED_OUTPATIENT_CLINIC_OR_DEPARTMENT_OTHER): Payer: BC Managed Care – PPO | Admitting: *Deleted

## 2012-01-11 ENCOUNTER — Other Ambulatory Visit: Payer: Self-pay | Admitting: Certified Registered Nurse Anesthetist

## 2012-01-11 VITALS — BP 111/73 | HR 73 | Temp 97.6°F | Resp 20

## 2012-01-11 DIAGNOSIS — D0590 Unspecified type of carcinoma in situ of unspecified breast: Secondary | ICD-10-CM

## 2012-01-11 DIAGNOSIS — E860 Dehydration: Secondary | ICD-10-CM

## 2012-01-11 DIAGNOSIS — C569 Malignant neoplasm of unspecified ovary: Secondary | ICD-10-CM

## 2012-01-11 LAB — TYPE AND SCREEN: Unit division: 0

## 2012-01-11 MED ORDER — SODIUM CHLORIDE 0.9 % IV SOLN
INTRAVENOUS | Status: AC
  Administered 2012-01-11: 10:00:00 via INTRAVENOUS

## 2012-01-11 MED ORDER — HEPARIN SOD (PORK) LOCK FLUSH 100 UNIT/ML IV SOLN
500.0000 [IU] | Freq: Once | INTRAVENOUS | Status: AC
  Administered 2012-01-11: 500 [IU] via INTRAVENOUS
  Filled 2012-01-11: qty 5

## 2012-01-11 MED ORDER — SODIUM CHLORIDE 0.9 % IJ SOLN
10.0000 mL | INTRAMUSCULAR | Status: DC | PRN
  Administered 2012-01-11: 10 mL via INTRAVENOUS
  Filled 2012-01-11: qty 10

## 2012-01-11 NOTE — Patient Instructions (Addendum)
Dehydration, Adult Dehydration means your body does not have as much fluid as it needs. Your kidneys, brain, and heart will not work properly without the right amount of fluids and salt.  HOME CARE  Ask your doctor how to replace body fluid losses (rehydrate).  Drink enough fluids to keep your pee (urine) clear or pale yellow.  Drink small amounts of fluids often if you feel sick to your stomach (nauseous) or throw up (vomit).  Eat like you normally do.  Avoid:  Foods or drinks high in sugar.  Bubbly (carbonated) drinks.  Juice.  Very hot or cold fluids.  Drinks with caffeine.  Fatty, greasy foods.  Alcohol.  Tobacco.  Eating too much.  Gelatin desserts.  Wash your hands to avoid spreading germs (bacteria, viruses).  Only take medicine as told by your doctor.  Keep all doctor visits as told. GET HELP RIGHT AWAY IF:   You cannot drink something without throwing up.  You get worse even with treatment.  Your vomit has blood in it or looks greenish.  Your poop (stool) has blood in it or looks black and tarry.  You have not peed in 6 to 8 hours.  You pee a small amount of very dark pee.  You have a fever.  You pass out (faint).  You have belly (abdominal) pain that gets worse or stays in one spot (localizes).  You have a rash, stiff neck, or bad headache.  You get easily annoyed, sleepy, or are hard to wake up.  You feel weak, dizzy, or very thirsty. MAKE SURE YOU:   Understand these instructions.  Will watch your condition.  Will get help right away if you are not doing well or get worse. Document Released: 11/28/2008 Document Revised: 04/26/2011 Document Reviewed: 09/21/2010 Exit[employer] Patient Information 2013 [employer].  

## 2012-01-12 ENCOUNTER — Ambulatory Visit (HOSPITAL_BASED_OUTPATIENT_CLINIC_OR_DEPARTMENT_OTHER): Payer: BC Managed Care – PPO | Admitting: Oncology

## 2012-01-12 ENCOUNTER — Encounter: Payer: Self-pay | Admitting: Oncology

## 2012-01-12 ENCOUNTER — Telehealth: Payer: Self-pay | Admitting: *Deleted

## 2012-01-12 ENCOUNTER — Other Ambulatory Visit (HOSPITAL_BASED_OUTPATIENT_CLINIC_OR_DEPARTMENT_OTHER): Payer: BC Managed Care – PPO | Admitting: Lab

## 2012-01-12 ENCOUNTER — Ambulatory Visit (HOSPITAL_BASED_OUTPATIENT_CLINIC_OR_DEPARTMENT_OTHER): Payer: BC Managed Care – PPO

## 2012-01-12 VITALS — BP 103/70 | HR 99 | Temp 98.2°F | Resp 20 | Ht 66.0 in | Wt 130.5 lb

## 2012-01-12 DIAGNOSIS — C569 Malignant neoplasm of unspecified ovary: Secondary | ICD-10-CM

## 2012-01-12 DIAGNOSIS — D649 Anemia, unspecified: Secondary | ICD-10-CM

## 2012-01-12 DIAGNOSIS — E860 Dehydration: Secondary | ICD-10-CM

## 2012-01-12 DIAGNOSIS — D0590 Unspecified type of carcinoma in situ of unspecified breast: Secondary | ICD-10-CM

## 2012-01-12 DIAGNOSIS — C50919 Malignant neoplasm of unspecified site of unspecified female breast: Secondary | ICD-10-CM

## 2012-01-12 LAB — COMPREHENSIVE METABOLIC PANEL (CC13)
ALT: 19 U/L (ref 0–55)
AST: 17 U/L (ref 5–34)
Albumin: 1.7 g/dL — ABNORMAL LOW (ref 3.5–5.0)
CO2: 21 mEq/L — ABNORMAL LOW (ref 22–29)
Calcium: 7.7 mg/dL — ABNORMAL LOW (ref 8.4–10.4)
Chloride: 103 mEq/L (ref 98–107)
Creatinine: 0.6 mg/dL (ref 0.6–1.1)
Potassium: 3.9 mEq/L (ref 3.5–5.1)
Total Protein: 5.1 g/dL — ABNORMAL LOW (ref 6.4–8.3)

## 2012-01-12 LAB — CBC WITH DIFFERENTIAL/PLATELET
Eosinophils Absolute: 0.3 10*3/uL (ref 0.0–0.5)
HCT: 39.9 % (ref 34.8–46.6)
LYMPH%: 3.3 % — ABNORMAL LOW (ref 14.0–49.7)
MONO#: 0.1 10*3/uL (ref 0.1–0.9)
NEUT#: 17.8 10*3/uL — ABNORMAL HIGH (ref 1.5–6.5)
Platelets: 451 10*3/uL — ABNORMAL HIGH (ref 145–400)
RBC: 4.82 10*6/uL (ref 3.70–5.45)
WBC: 18.8 10*3/uL — ABNORMAL HIGH (ref 3.9–10.3)
lymph#: 0.6 10*3/uL — ABNORMAL LOW (ref 0.9–3.3)
nRBC: 0 % (ref 0–0)

## 2012-01-12 MED ORDER — SODIUM CHLORIDE 0.9 % IV SOLN
INTRAVENOUS | Status: DC
  Administered 2012-01-12: 13:00:00 via INTRAVENOUS

## 2012-01-12 MED ORDER — MEGESTROL ACETATE 625 MG/5ML PO SUSP: 625.0000 mg | Freq: Every day | ORAL | Status: DC

## 2012-01-12 MED ORDER — NYSTATIN 100000 UNIT/ML MT SUSP: 500000.0000 [IU] | Freq: Four times a day (QID) | OROMUCOSAL | Status: DC

## 2012-01-12 MED ORDER — DEXAMETHASONE 4 MG PO TABS: ORAL_TABLET | ORAL | Status: DC

## 2012-01-12 MED ORDER — MAGIC MOUTHWASH: 5.0000 mL | Freq: Four times a day (QID) | ORAL | Status: DC | PRN

## 2012-01-12 NOTE — Patient Instructions (Addendum)
Proceed with IVF today  Megace for appetite stimulation  Nystatin for thrush  Dexamethasone 4 mg by mouth everyday to improve strength and appetite  Magic mouth rinses every 8 hours as needed

## 2012-01-12 NOTE — Progress Notes (Signed)
OFFICE PROGRESS NOTE  CC  KNAPP,[name], MD [address] [place] [address] Dr. Emelia Loron Dr. 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  PRIOR THERAPY: #1. S/P left breast lumpectomy in March 2011 after she had a screen detected 1.0 cm ER+, PR+, intermediate grade DCIS. Patient had 3 sentinel biopsied all of them were negative for metastatic disease. Patient underwent radiation therapy between 06/05/2009 through 07/02/2009. She was then begun on tamoxifen 20 mg daily in August 2011.  #2 new diagnosis of gynecologic malignancy presenting with abdominal mass and pleural effusions. Patient was recently hospitalized with shortness of breath she was discovered to have malignant pleural effusion she is status post Pleurx catheter is. She also had malignant ascites she's had several paracentesis procedures performed during her hospitalization. During her hospitalization she was seen by gynecologic oncology. She also went on to receive chemotherapy consisting of Taxol and carboplatinum this is her cycle 1. Her chemotherapy will be given every 21 days with day 2 Neulasta.    CURRENT THERAPY: Patient is status post cycle 1 of Taxol and carboplatinum  INTERVAL HISTORY: [name] [name] 66 y.o. female returns for follow up visit. She was seen on Monday, November 25 after her discharge from the hospital. She continues to remain very weak tired. She is having difficulty breathing. She does have Pleurx catheter is in place but she is not draining any fluid so far. She has home health but comes to help her with this. She does have constipation she was given laxative and she did have a bowel movement yesterday but that just made are even more fatigued. She is complaining of having some nausea she does have antiemetics at home she has not had any fevers chills or night sweats no bleeding. Patient is cachectic has very poor  appetite I do think she needs an appetite stimulant.  Marland KitchenMEDICAL HISTORY: Past Medical History  Diagnosis Date  . Interstitial cystitis     on chronic antibiotics  . Hyperlipidemia   . Frequent UTI     on prophylaxis  . 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 CAD (coronary artery disease)     a. s/p CABG 7/13;   b.  LHC 12/01/11:  pLAD 70%, mLAD 40%, CFX 40-50% prior to takeoff of the OM2, oRCA occluded, mid vessel filled via R->R collaterals and distal vessel filled by L->R collaterals, S-OM1/OM2 (small and diffusely dz) with mid 90% stenosis, 90% at OM1 anastomotic site, continuation of OM2 occluded, S-Dx patent, S-PDA occluded, L-LAD ok, EF 55-65%  => Med Rx rec.  [name] Hx of echocardiogram     Echo 5/13: EF 60-65%, grade 2 diastolic dysfunction  . Hypercholesterolemia   . Pleural effusion 12/28/11  . GERD (gastroesophageal reflux disease)   . Arthritis     "just a little; lower back" (12/28/11)  . Gout attack 08/2011    related to "stress post OHS"  . Depression   . Breast cancer     "left"; on Tamoxifen  . S/P thoracentesis 12/28/11    "for pleural effusion" (12/28/2011)  . Ovarian cancer     ALLERGIES:  is allergic to codeine; isosorbide; other; phenothiazines; talwin; and yellow jacket venom.  MEDICATIONS:  Current Outpatient Prescriptions  Medication Sig Dispense Refill  . ALPRAZolam (XANAX) 0.25 MG tablet Take 1 tablet (0.25 mg total) by mouth every 8 (eight) hours as needed for anxiety.  30 tablet  0  .  aspirin 325 MG tablet Take 325 mg by mouth daily.      [name] atorvastatin (LIPITOR) 20 MG tablet Take 20 mg by mouth daily.      . carvedilol (COREG) 25 MG tablet Take 25 mg by mouth 2 (two) times daily.      [name] dexamethasone (DECADRON) 4 MG tablet Take 2 tablets (8 mg total) by mouth 2 (two) times daily with a meal. Take two times a day starting the day after chemotherapy for 3 days.  30 tablet  1  . Docusate Calcium (STOOL SOFTENER PO) Take 2 capsules by mouth every morning.      [name]  EPINEPHrine (EPIPEN 2-PAK) 0.3 mg/0.3 mL DEVI Inject 0.3 mg into the muscle.       [name] HYDROcodone-acetaminophen (NORCO/VICODIN) 5-325 MG per tablet Take 1-2 tablets by mouth every 4 (four) hours as needed.  30 tablet  0  . LORazepam (ATIVAN) 0.5 MG tablet Take 1 tablet (0.5 mg total) by mouth every 6 (six) hours as needed (Nausea or vomiting).  30 tablet  0  . nitroGLYCERIN (NITROSTAT) 0.4 MG SL tablet Place 0.4 mg under the tongue every 5 (five) minutes as needed. Chest pain      . omeprazole (PRILOSEC) 20 MG capsule Take 20 mg by mouth daily.      . ondansetron (ZOFRAN) 8 MG tablet Take 1 tablet (8 mg total) by mouth 2 (two) times daily. Take two times a day starting the day after chemo for 3 days. Then take two times a day as needed for nausea or vomiting.  30 tablet  1  . oxybutynin (DITROPAN-XL) 10 MG 24 hr tablet Take 10 mg by mouth daily.      Bertram Gala Glycol-Propyl Glycol 0.4-0.3 % SOLN Place 1 drop into both eyes every morning.       . tamoxifen (NOLVADEX) 20 MG tablet Take 20 mg by mouth daily.      [name] trimethoprim (TRIMPEX) 100 MG tablet Take 100 mg by mouth daily.      [name] zolpidem (AMBIEN) 10 MG tablet Take 1 tablet (10 mg total) by mouth at bedtime as needed for sleep.  30 tablet  0   No current facility-administered medications for this visit.   Facility-Administered Medications Ordered in Other Visits  Medication Dose Route Frequency Provider Last Rate Last Dose  . [EXPIRED] 0.9 %  sodium chloride infusion   Intravenous Continuous Victorino December, MD      . [COMPLETED] heparin lock flush 100 unit/mL  500 Units Intravenous Once Victorino December, MD   500 Units at 01/11/12 1200  . influenza  inactive virus vaccine (FLUZONE/FLUARIX) injection 0.5 mL  0.5 mL Intramuscular Once Joselyn Arrow, MD      . [DISCONTINUED] sodium chloride 0.9 % injection 10 mL  10 mL Intravenous PRN Victorino December, MD   10 mL at 01/11/12 1200    SURGICAL HISTORY:  Past Surgical History  Procedure Date  . Breast  lumpectomy 04/2009    left  . Cesarean section 1973; 1976  . Muscle release 1960    L neck and chest.; "when I was 13; pneumonia settled in my left neck"  . Coronary artery bypass graft 08/18/2011    Procedure: CORONARY ARTERY BYPASS GRAFTING (CABG);  Surgeon: Alleen Borne, MD;  Location: [facility] OR;  Service: Open Heart Surgery;  Laterality: N/A;  Coronary Artery Bypass Graft times five utilizing the left internal mammary artery and the left greater saphenous vein harvested  endoscopically.  . Abdominal hysterectomy 1976  . Appendectomy 1976  . Portacath placement 01/06/2012    Procedure: INSERTION PORT-A-CATH;  Surgeon: Alleen Borne, MD;  Location: [facility] OR;  Service: Thoracic;  Laterality: Left;  . Chest tube insertion 01/06/2012    Procedure: INSERTION PLEURAL DRAINAGE CATHETER;  Surgeon: Alleen Borne, MD;  Location: [facility] OR;  Service: Thoracic;  Laterality: Bilateral;    REVIEW OF SYSTEMS:  Pertinent items are noted in HPI.   PHYSICAL EXAMINATION: General appearance: alert, cooperative, appears stated age and no distress Head: Normocephalic, without obvious abnormality, atraumatic Neck: no adenopathy, no carotid bruit, no JVD, supple, symmetrical, trachea midline and thyroid not enlarged, symmetric, no tenderness/mass/nodules Lymph nodes: Cervical, supraclavicular, and axillary nodes normal. Resp: clear to auscultation bilaterally and normal percussion bilaterally Back: symmetric, no curvature. ROM normal. No CVA tenderness. Cardio: regular rate and rhythm, S1, S2 normal, no murmur, click, rub or gallop and normal apical impulse GI: soft, non-tender; bowel sounds normal; no masses,  no organomegaly Extremities: extremities normal, atraumatic, no cyanosis or edema Neurologic: Alert and oriented X 3, normal strength and tone. Normal symmetric reflexes. Normal coordination and gait Bilateral Breast Exam: no masses or nipple discharge, no skin changes, left breast has a well healaed scar, no  nodularity or masses.  ECOG PERFORMANCE STATUS: 0 - Asymptomatic  Blood pressure 103/70, pulse 99, temperature 98.2 F (36.8 C), temperature source Oral, resp. rate 20, height 5\' 6"  (1.676 m), weight 130 lb 8 oz (59.194 kg).  LABORATORY DATA: Lab Results  Component Value Date   WBC 14.8* 01/10/2012   HGB 9.4* 01/10/2012   HCT 29.4* 01/10/2012   MCV 81.1 01/10/2012   PLT 593* 01/10/2012      Chemistry      Component Value Date/Time   NA 133* 01/10/2012 1325   NA 131* 01/07/2012 1746   NA 141 12/17/2011 1144   K 5.1 01/10/2012 1325   K 4.3 01/07/2012 1746   CL 99 01/10/2012 1325   CL 96 01/07/2012 1746   CO2 25 01/10/2012 1325   CO2 27 01/07/2012 1746   BUN 21.0 01/10/2012 1325   BUN 19 01/07/2012 1746   BUN 15 12/17/2011 1144   CREATININE 0.6 01/10/2012 1325   CREATININE 0.61 01/07/2012 1746      Component Value Date/Time   CALCIUM 8.2* 01/10/2012 1325   CALCIUM 8.1* 01/07/2012 1746   ALKPHOS 72 01/10/2012 1325   ALKPHOS 64 01/07/2012 1746   AST 39* 01/10/2012 1325   AST 16 01/07/2012 1746   ALT 27 01/10/2012 1325   ALT 11 01/07/2012 1746   BILITOT 0.27 01/10/2012 1325   BILITOT 0.1* 01/07/2012 1746       RADIOGRAPHIC STUDIES:  No results found.  ASSESSMENT: 66 year old female with:  #1 DCIS of the breast. She is on tamoxifen 20 mg daily. However I have recommended that we discontinue the tamoxifen for now a do to her recent diagnosis of ovarian cancer.  #2 recent diagnosis of gynecologic malignancy patient is status post cycle 1 of Taxol and carboplatinum given during her recent hospitalization. Overall she tolerated it well.  #3 anemia patient is status post 2 units of packed red cells.  #4 dehydration patient has been receiving IV fluids she received a yesterday and today.  #5 malnutrition she is encouraged to continue taking calorie dense foods including proteins. We do have to stimulate her appetite I have therefore gone ahead and given her Megace as  well as dexamethasone.  #  6 oral dental hygiene she has received prescriptions for nystatin magic mouthwash.   PLAN:  #1 patient will continue IV fluids today.  #2 Megace for appetite along with dexamethasone.  #3 she will call with any problems.  #4 we will see her back in 01/17/2012.   All questions were answered. The patient knows to call the clinic with any problems, questions or concerns. We can certainly see the patient much sooner if necessary.  I spent 20 minutes counseling the patient face to face. The total time spent in the appointment was 30 minutes.    Drue Second, MD Medical/Oncology [facility] [phone] (beeper) [phone] (Office)  01/12/2012, 11:18 AM

## 2012-01-12 NOTE — Telephone Encounter (Signed)
Gave patient appointment for 01-17-2012 starting at 10:15am   Sent michelle email to set up patient for fluids on 01-17-2012

## 2012-01-12 NOTE — Patient Instructions (Addendum)
Dehydration, Adult Dehydration is when you lose more fluids from the body than you take in. Vital organs like the kidneys, brain, and heart cannot function without a proper amount of fluids and salt. Any loss of fluids from the body can cause dehydration.  CAUSES   Vomiting.  Diarrhea.  Excessive sweating.  Excessive urine output.  Fever. SYMPTOMS  Mild dehydration  Thirst.  Dry lips.  Slightly dry mouth. Moderate dehydration  Very dry mouth.  Sunken eyes.  Skin does not bounce back quickly when lightly pinched and released.  Dark urine and decreased urine production.  Decreased tear production.  Headache. Severe dehydration  Very dry mouth.  Extreme thirst.  Rapid, weak pulse (more than 100 beats per minute at rest).  Cold hands and feet.  Not able to sweat in spite of heat and temperature.  Rapid breathing.  Blue lips.  Confusion and lethargy.  Difficulty being awakened.  Minimal urine production.  No tears. DIAGNOSIS  Your caregiver will diagnose dehydration based on your symptoms and your exam. Blood and urine tests will help confirm the diagnosis. The diagnostic evaluation should also identify the cause of dehydration. TREATMENT  Treatment of mild or moderate dehydration can often be done at home by increasing the amount of fluids that you drink. It is best to drink small amounts of fluid more often. Drinking too much at one time can make vomiting worse. Refer to the home care instructions below. Severe dehydration needs to be treated at the hospital where you will probably be given intravenous (IV) fluids that contain water and electrolytes. HOME CARE INSTRUCTIONS   Ask your caregiver about specific rehydration instructions.  Drink enough fluids to keep your urine clear or pale yellow.  Drink small amounts frequently if you have nausea and vomiting.  Eat as you normally do.  Avoid:  Foods or drinks high in sugar.  Carbonated  drinks.  Juice.  Extremely hot or cold fluids.  Drinks with caffeine.  Fatty, greasy foods.  Alcohol.  Tobacco.  Overeating.  Gelatin desserts.  Wash your hands well to avoid spreading bacteria and viruses.  Only take over-the-counter or prescription medicines for pain, discomfort, or fever as directed by your caregiver.  Ask your caregiver if you should continue all prescribed and over-the-counter medicines.  Keep all follow-up appointments with your caregiver. SEEK MEDICAL CARE IF:  You have abdominal pain and it increases or stays in one area (localizes).  You have a rash, stiff neck, or severe headache.  You are irritable, sleepy, or difficult to awaken.  You are weak, dizzy, or extremely thirsty. SEEK IMMEDIATE MEDICAL CARE IF:   You are unable to keep fluids down or you get worse despite treatment.  You have frequent episodes of vomiting or diarrhea.  You have blood or green matter (bile) in your vomit.  You have blood in your stool or your stool looks black and tarry.  You have not urinated in 6 to 8 hours, or you have only urinated a small amount of very dark urine.  You have a fever.  You faint. MAKE SURE YOU:   Understand these instructions.  Will watch your condition.  Will get help right away if you are not doing well or get worse. Document Released: 02/01/2005 Document Revised: 04/26/2011 Document Reviewed: 09/21/2010 [employer] Patient Information 2013 [employer].  

## 2012-01-12 NOTE — Progress Notes (Signed)
Pt port needs to be cleared with Alteplase next time she is in infusion.  She could not stay for the time required to do it today.  Pt verbalized understanding.

## 2012-01-17 ENCOUNTER — Ambulatory Visit: Payer: BC Managed Care – PPO | Admitting: Adult Health

## 2012-01-17 ENCOUNTER — Other Ambulatory Visit: Payer: BC Managed Care – PPO | Admitting: Lab

## 2012-01-18 ENCOUNTER — Other Ambulatory Visit: Payer: BC Managed Care – PPO | Admitting: Lab

## 2012-01-18 ENCOUNTER — Telehealth: Payer: Self-pay | Admitting: *Deleted

## 2012-01-18 ENCOUNTER — Ambulatory Visit (HOSPITAL_BASED_OUTPATIENT_CLINIC_OR_DEPARTMENT_OTHER): Payer: BC Managed Care – PPO | Admitting: Oncology

## 2012-01-18 ENCOUNTER — Ambulatory Visit: Payer: BC Managed Care – PPO | Admitting: Adult Health

## 2012-01-18 ENCOUNTER — Other Ambulatory Visit: Payer: Self-pay | Admitting: Oncology

## 2012-01-18 ENCOUNTER — Ambulatory Visit (HOSPITAL_BASED_OUTPATIENT_CLINIC_OR_DEPARTMENT_OTHER): Payer: BC Managed Care – PPO | Admitting: Lab

## 2012-01-18 VITALS — BP 114/71 | HR 82 | Temp 98.1°F | Resp 20 | Ht 66.0 in | Wt 131.3 lb

## 2012-01-18 DIAGNOSIS — J910 Malignant pleural effusion: Secondary | ICD-10-CM

## 2012-01-18 DIAGNOSIS — C801 Malignant (primary) neoplasm, unspecified: Secondary | ICD-10-CM

## 2012-01-18 DIAGNOSIS — E46 Unspecified protein-calorie malnutrition: Secondary | ICD-10-CM

## 2012-01-18 DIAGNOSIS — C569 Malignant neoplasm of unspecified ovary: Secondary | ICD-10-CM

## 2012-01-18 DIAGNOSIS — C482 Malignant neoplasm of peritoneum, unspecified: Secondary | ICD-10-CM

## 2012-01-18 DIAGNOSIS — E860 Dehydration: Secondary | ICD-10-CM

## 2012-01-18 LAB — BASIC METABOLIC PANEL (CC13)
BUN: 16 mg/dL (ref 7.0–26.0)
Calcium: 8.4 mg/dL (ref 8.4–10.4)
Potassium: 3.8 mEq/L (ref 3.5–5.1)

## 2012-01-18 LAB — CBC WITH DIFFERENTIAL/PLATELET
Basophils Absolute: 0 10*3/uL (ref 0.0–0.1)
EOS%: 0.1 % (ref 0.0–7.0)
HGB: 10.9 g/dL — ABNORMAL LOW (ref 11.6–15.9)
LYMPH%: 4.9 % — ABNORMAL LOW (ref 14.0–49.7)
MCH: 26.6 pg (ref 25.1–34.0)
MCV: 83.9 fL (ref 79.5–101.0)
MONO%: 2.7 % (ref 0.0–14.0)
Platelets: 295 10*3/uL (ref 145–400)
RBC: 4.08 10*6/uL (ref 3.70–5.45)
RDW: 16.3 % — ABNORMAL HIGH (ref 11.2–14.5)

## 2012-01-18 MED ORDER — SODIUM CHLORIDE 0.9 % IV SOLN: Freq: Once | INTRAVENOUS | Status: DC

## 2012-01-18 NOTE — Patient Instructions (Addendum)
Doing well  We will see you back on 12/13 for next chemotherapy

## 2012-01-18 NOTE — Telephone Encounter (Signed)
see LA on 12/13, 12/19, 1/3, 1/10, , see KK on 1/24, See LA on 1/31  Gave patient appointments for the above dates  Injection appointments for 01-29-2012 02-19-2012 03-11-2012  Sent michelle email to set up patients treatments

## 2012-01-19 NOTE — Progress Notes (Signed)
OFFICE PROGRESS NOTE  CC  KNAPP,[name], MD [address] [place] [address] Dr. Emelia Loron Dr. 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  PRIOR THERAPY: #1. S/P left breast lumpectomy in March 2011 after she had a screen detected 1.0 cm ER+, PR+, intermediate grade DCIS. Patient had 3 sentinel biopsied all of them were negative for metastatic disease. Patient underwent radiation therapy between 06/05/2009 through 07/02/2009. She was then begun on tamoxifen 20 mg daily in August 2011.  #2 new diagnosis of gynecologic malignancy presenting with abdominal mass and pleural effusions. Patient was recently hospitalized with shortness of breath she was discovered to have malignant pleural effusion she is status post Pleurx catheter is. She also had malignant ascites she's had several paracentesis procedures performed during her hospitalization. During her hospitalization she was seen by gynecologic oncology. She also went on to receive chemotherapy consisting of Taxol and carboplatinum this is her cycle 1. Her chemotherapy will be given every 21 days with day 2 Neulasta.    CURRENT THERAPY: Patient is status post cycle 1 of Taxol and carboplatinum  INTERVAL HISTORY: [name] [name] 66 y.o. female returns for follow up visit. She seems to be a little bit more perked up today. She is now ambulating with a walker. She had a really good Thanksgiving. She is eating significantly better. On her exam she is noted to have thrush and mucositis. But apparently this is not hurting her at all and she is not having any difficulty in swallowing. She is denying any nausea vomiting or any shortness of breath. She is still fatigued and weak and tired and she is resting quite a bit. But all in all her performance status has improved significantly.  Marland KitchenMEDICAL HISTORY: Past Medical History  Diagnosis Date  . Interstitial  cystitis     on chronic antibiotics  . Hyperlipidemia   . Frequent UTI     on prophylaxis  . 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 CAD (coronary artery disease)     a. s/p CABG 7/13;   b.  LHC 12/01/11:  pLAD 70%, mLAD 40%, CFX 40-50% prior to takeoff of the OM2, oRCA occluded, mid vessel filled via R->R collaterals and distal vessel filled by L->R collaterals, S-OM1/OM2 (small and diffusely dz) with mid 90% stenosis, 90% at OM1 anastomotic site, continuation of OM2 occluded, S-Dx patent, S-PDA occluded, L-LAD ok, EF 55-65%  => Med Rx rec.  [name] Hx of echocardiogram     Echo 5/13: EF 60-65%, grade 2 diastolic dysfunction  . Hypercholesterolemia   . Pleural effusion 12/28/11  . GERD (gastroesophageal reflux disease)   . Arthritis     "just a little; lower back" (12/28/11)  . Gout attack 08/2011    related to "stress post OHS"  . Depression   . Breast cancer     "left"; on Tamoxifen  . S/P thoracentesis 12/28/11    "for pleural effusion" (12/28/2011)  . Ovarian cancer     ALLERGIES:  is allergic to codeine; isosorbide; other; phenothiazines; talwin; and yellow jacket venom.  MEDICATIONS:  Current Outpatient Prescriptions  Medication Sig Dispense Refill  . ALPRAZolam (XANAX) 0.25 MG tablet Take 1 tablet (0.25 mg total) by mouth every 8 (eight) hours as needed for anxiety.  30 tablet  0  . Alum & Mag Hydroxide-Simeth (MAGIC MOUTHWASH) SOLN Take 5 mLs by mouth 4 (four) times daily as needed.  80 mL  6  .  aspirin 325 MG tablet Take 325 mg by mouth daily.      [name] atorvastatin (LIPITOR) 20 MG tablet Take 20 mg by mouth daily.      . carvedilol (COREG) 25 MG tablet Take 25 mg by mouth 2 (two) times daily.      [name] dexamethasone (DECADRON) 4 MG tablet Take 2 tablets (8 mg total) by mouth 2 (two) times daily with a meal. Take two times a day starting the day after chemotherapy for 3 days.  30 tablet  1  . dexamethasone (DECADRON) 4 MG tablet Take 1 by mouth daily  30 tablet  6  . Docusate Calcium (STOOL  SOFTENER PO) Take 2 capsules by mouth every morning.      [name] EPINEPHrine (EPIPEN 2-PAK) 0.3 mg/0.3 mL DEVI Inject 0.3 mg into the muscle.       [name] HYDROcodone-acetaminophen (NORCO/VICODIN) 5-325 MG per tablet Take 1-2 tablets by mouth every 4 (four) hours as needed.  30 tablet  0  . LORazepam (ATIVAN) 0.5 MG tablet Take 1 tablet (0.5 mg total) by mouth every 6 (six) hours as needed (Nausea or vomiting).  30 tablet  0  . megestrol (MEGACE ES) 625 MG/5ML suspension Take 5 mLs (625 mg total) by mouth daily.  150 mL  0  . nitroGLYCERIN (NITROSTAT) 0.4 MG SL tablet Place 0.4 mg under the tongue every 5 (five) minutes as needed. Chest pain      . nystatin (MYCOSTATIN) 100000 UNIT/ML suspension Take 5 mLs (500,000 Units total) by mouth 4 (four) times daily.  60 mL  6  . omeprazole (PRILOSEC) 20 MG capsule Take 20 mg by mouth daily.      . ondansetron (ZOFRAN) 8 MG tablet Take 1 tablet (8 mg total) by mouth 2 (two) times daily. Take two times a day starting the day after chemo for 3 days. Then take two times a day as needed for nausea or vomiting.  30 tablet  1  . oxybutynin (DITROPAN-XL) 10 MG 24 hr tablet Take 10 mg by mouth daily.      Bertram Gala Glycol-Propyl Glycol 0.4-0.3 % SOLN Place 1 drop into both eyes every morning.       . trimethoprim (TRIMPEX) 100 MG tablet Take 100 mg by mouth daily.      [name] zolpidem (AMBIEN) 10 MG tablet Take 1 tablet (10 mg total) by mouth at bedtime as needed for sleep.  30 tablet  0   No current facility-administered medications for this visit.   Facility-Administered Medications Ordered in Other Visits  Medication Dose Route Frequency Provider Last Rate Last Dose  . 0.9 %  sodium chloride infusion   Intravenous Once Victorino December, MD      . influenza  inactive virus vaccine (FLUZONE/FLUARIX) injection 0.5 mL  0.5 mL Intramuscular Once Joselyn Arrow, MD        SURGICAL HISTORY:  Past Surgical History  Procedure Date  . Breast lumpectomy 04/2009    left  . Cesarean  section 1973; 1976  . Muscle release 1960    L neck and chest.; "when I was 13; pneumonia settled in my left neck"  . Coronary artery bypass graft 08/18/2011    Procedure: CORONARY ARTERY BYPASS GRAFTING (CABG);  Surgeon: Alleen Borne, MD;  Location: [facility] OR;  Service: Open Heart Surgery;  Laterality: N/A;  Coronary Artery Bypass Graft times five utilizing the left internal mammary artery and the left greater saphenous vein harvested endoscopically.  . Abdominal hysterectomy  1976  . Appendectomy 1976  . Portacath placement 01/06/2012    Procedure: INSERTION PORT-A-CATH;  Surgeon: Alleen Borne, MD;  Location: [facility] OR;  Service: Thoracic;  Laterality: Left;  . Chest tube insertion 01/06/2012    Procedure: INSERTION PLEURAL DRAINAGE CATHETER;  Surgeon: Alleen Borne, MD;  Location: [facility] OR;  Service: Thoracic;  Laterality: Bilateral;    REVIEW OF SYSTEMS:  Pertinent items are noted in HPI.   PHYSICAL EXAMINATION: General appearance: alert, cooperative, appears stated age and no distress Head: Normocephalic, without obvious abnormality, atraumatic Neck: no adenopathy, no carotid bruit, no JVD, supple, symmetrical, trachea midline and thyroid not enlarged, symmetric, no tenderness/mass/nodules Lymph nodes: Cervical, supraclavicular, and axillary nodes normal. Resp: clear to auscultation bilaterally and normal percussion bilaterally Back: symmetric, no curvature. ROM normal. No CVA tenderness. Cardio: regular rate and rhythm, S1, S2 normal, no murmur, click, rub or gallop and normal apical impulse GI: soft, non-tender; bowel sounds normal; no masses,  no organomegaly Extremities: extremities normal, atraumatic, no cyanosis or edema Neurologic: Alert and oriented X 3, normal strength and tone. Normal symmetric reflexes. Normal coordination and gait Bilateral Breast Exam: no masses or nipple discharge, no skin changes, left breast has a well healaed scar, no nodularity or masses.  ECOG PERFORMANCE  STATUS: 0 - Asymptomatic  Blood pressure 114/71, pulse 82, temperature 98.1 F (36.7 C), temperature source Oral, resp. rate 20, height 5\' 6"  (1.676 m), weight 131 lb 4.8 oz (59.557 kg).  LABORATORY DATA: Lab Results  Component Value Date   WBC 31.1* 01/18/2012   HGB 10.9* 01/18/2012   HCT 34.2* 01/18/2012   MCV 83.9 01/18/2012   PLT 295 01/18/2012      Chemistry      Component Value Date/Time   NA 139 01/18/2012 1141   NA 131* 01/07/2012 1746   NA 141 12/17/2011 1144   K 3.8 01/18/2012 1141   K 4.3 01/07/2012 1746   CL 105 01/18/2012 1141   CL 96 01/07/2012 1746   CO2 21* 01/18/2012 1141   CO2 27 01/07/2012 1746   BUN 16.0 01/18/2012 1141   BUN 19 01/07/2012 1746   BUN 15 12/17/2011 1144   CREATININE 0.6 01/18/2012 1141   CREATININE 0.61 01/07/2012 1746      Component Value Date/Time   CALCIUM 8.4 01/18/2012 1141   CALCIUM 8.1* 01/07/2012 1746   ALKPHOS 69 01/12/2012 1044   ALKPHOS 64 01/07/2012 1746   AST 17 01/12/2012 1044   AST 16 01/07/2012 1746   ALT 19 01/12/2012 1044   ALT 11 01/07/2012 1746   BILITOT 0.30 01/12/2012 1044   BILITOT 0.1* 01/07/2012 1746       RADIOGRAPHIC STUDIES:  No results found.  ASSESSMENT: 66 year old female with:  #1 DCIS of the breast. She is on tamoxifen 20 mg daily. However I have recommended that we discontinue the tamoxifen for now a do to her recent diagnosis of ovarian cancer.  #2 recent diagnosis of gynecologic malignancy patient is status post cycle 1 of Taxol and carboplatinum given during her recent hospitalization. Overall she tolerated it well.  #3 anemia patient is status post 2 units of packed red cells.  #4 dehydration patient has been receiving IV fluids she received a yesterday and today.  #5 malnutrition she is encouraged to continue taking calorie dense foods including proteins. We do have to stimulate her appetite I have therefore gone ahead and given her Megace as well as dexamethasone.  #6 oral dental hygiene she  has received prescriptions for nystatin magic mouthwash.   PLAN:  #1 patient will continue oral dental hygiene since she does have thrush now. I did ask them to do nystatin swish and swallow 4 times a day. She will also continue the Magic mouthwash for mouth sores.  #2 she is beginning to eat and have encouraged her to continue taking the Megace for appetite stimulation.  #3 she will return in 01/28/2012 for cycle #2 of Taxol and carboplatinum.  All questions were answered. The patient knows to call the clinic with any problems, questions or concerns. We can certainly see the patient much sooner if necessary.  I spent 25 minutes counseling the patient face to face. The total time spent in the appointment was 30 minutes.    Drue Second, MD Medical/Oncology [facility] [phone] (beeper) [phone] (Office)  01/19/2012, 3:11 PM

## 2012-01-20 ENCOUNTER — Telehealth: Payer: Self-pay | Admitting: *Deleted

## 2012-01-20 ENCOUNTER — Other Ambulatory Visit: Payer: BC Managed Care – PPO | Admitting: Lab

## 2012-01-20 ENCOUNTER — Ambulatory Visit: Payer: BC Managed Care – PPO

## 2012-01-20 ENCOUNTER — Ambulatory Visit: Payer: BC Managed Care – PPO | Admitting: Adult Health

## 2012-01-20 NOTE — Telephone Encounter (Signed)
Per staff message and pof I have scheduled appts. JWM

## 2012-01-26 LAB — FUNGUS CULTURE W SMEAR
Fungal Smear: NONE SEEN
Fungal Smear: NONE SEEN

## 2012-01-27 ENCOUNTER — Encounter: Payer: Self-pay | Admitting: Specialist

## 2012-01-27 NOTE — Progress Notes (Signed)
I spoke briefly with [name] [name] by phone today and offered support from the Patient and [facility].  I also told her about the GYN Support Group and will talk more with her about any need she may have for counseling support.  She was busy today and had little time to talk, but I will call her next week.

## 2012-01-28 ENCOUNTER — Ambulatory Visit (HOSPITAL_BASED_OUTPATIENT_CLINIC_OR_DEPARTMENT_OTHER): Payer: BC Managed Care – PPO | Admitting: Adult Health

## 2012-01-28 ENCOUNTER — Ambulatory Visit (HOSPITAL_BASED_OUTPATIENT_CLINIC_OR_DEPARTMENT_OTHER): Payer: BC Managed Care – PPO

## 2012-01-28 ENCOUNTER — Other Ambulatory Visit (HOSPITAL_BASED_OUTPATIENT_CLINIC_OR_DEPARTMENT_OTHER): Payer: BC Managed Care – PPO | Admitting: Lab

## 2012-01-28 ENCOUNTER — Encounter: Payer: Self-pay | Admitting: Adult Health

## 2012-01-28 ENCOUNTER — Encounter: Payer: Self-pay | Admitting: Oncology

## 2012-01-28 VITALS — BP 117/74 | HR 87 | Temp 98.0°F | Resp 20 | Ht 66.0 in | Wt 132.0 lb

## 2012-01-28 DIAGNOSIS — C569 Malignant neoplasm of unspecified ovary: Secondary | ICD-10-CM

## 2012-01-28 DIAGNOSIS — C786 Secondary malignant neoplasm of retroperitoneum and peritoneum: Secondary | ICD-10-CM

## 2012-01-28 DIAGNOSIS — Z5111 Encounter for antineoplastic chemotherapy: Secondary | ICD-10-CM

## 2012-01-28 DIAGNOSIS — D0590 Unspecified type of carcinoma in situ of unspecified breast: Secondary | ICD-10-CM

## 2012-01-28 DIAGNOSIS — D649 Anemia, unspecified: Secondary | ICD-10-CM

## 2012-01-28 DIAGNOSIS — C801 Malignant (primary) neoplasm, unspecified: Secondary | ICD-10-CM

## 2012-01-28 DIAGNOSIS — E43 Unspecified severe protein-calorie malnutrition: Secondary | ICD-10-CM

## 2012-01-28 DIAGNOSIS — C50919 Malignant neoplasm of unspecified site of unspecified female breast: Secondary | ICD-10-CM

## 2012-01-28 LAB — CBC WITH DIFFERENTIAL/PLATELET
BASO%: 0.5 % (ref 0.0–2.0)
EOS%: 0.1 % (ref 0.0–7.0)
HCT: 37.2 % (ref 34.8–46.6)
LYMPH%: 11.3 % — ABNORMAL LOW (ref 14.0–49.7)
MCH: 26.9 pg (ref 25.1–34.0)
MCHC: 32 g/dL (ref 31.5–36.0)
MCV: 84 fL (ref 79.5–101.0)
MONO#: 0.8 10*3/uL (ref 0.1–0.9)
MONO%: 4.8 % (ref 0.0–14.0)
NEUT%: 83.3 % — ABNORMAL HIGH (ref 38.4–76.8)
Platelets: 353 10*3/uL (ref 145–400)
RBC: 4.43 10*6/uL (ref 3.70–5.45)
WBC: 16.8 10*3/uL — ABNORMAL HIGH (ref 3.9–10.3)
nRBC: 0 % (ref 0–0)

## 2012-01-28 LAB — COMPREHENSIVE METABOLIC PANEL (CC13)
AST: 12 U/L (ref 5–34)
Albumin: 2.9 g/dL — ABNORMAL LOW (ref 3.5–5.0)
Alkaline Phosphatase: 69 U/L (ref 40–150)
BUN: 19 mg/dL (ref 7.0–26.0)
Chloride: 105 mEq/L (ref 98–107)
Creatinine: 0.6 mg/dL (ref 0.6–1.1)
Glucose: 97 mg/dl (ref 70–99)
Potassium: 4.7 mEq/L (ref 3.5–5.1)
Total Bilirubin: 0.24 mg/dL (ref 0.20–1.20)

## 2012-01-28 MED ORDER — ONDANSETRON 16 MG/50ML IVPB (CHCC)
16.0000 mg | Freq: Once | INTRAVENOUS | Status: AC
  Administered 2012-01-28: 16 mg via INTRAVENOUS

## 2012-01-28 MED ORDER — CARBOPLATIN CHEMO INJECTION 600 MG/60ML
560.0000 mg | Freq: Once | INTRAVENOUS | Status: AC
  Administered 2012-01-28: 560 mg via INTRAVENOUS
  Filled 2012-01-28: qty 56

## 2012-01-28 MED ORDER — SODIUM CHLORIDE 0.9 % IV SOLN
Freq: Once | INTRAVENOUS | Status: AC
  Administered 2012-01-28: 10:00:00 via INTRAVENOUS

## 2012-01-28 MED ORDER — SODIUM CHLORIDE 0.9 % IJ SOLN
10.0000 mL | INTRAMUSCULAR | Status: DC | PRN
  Administered 2012-01-28: 10 mL
  Filled 2012-01-28: qty 10

## 2012-01-28 MED ORDER — DIPHENHYDRAMINE HCL 50 MG/ML IJ SOLN
50.0000 mg | Freq: Once | INTRAMUSCULAR | Status: AC
  Administered 2012-01-28: 50 mg via INTRAVENOUS

## 2012-01-28 MED ORDER — PACLITAXEL CHEMO INJECTION 300 MG/50ML
175.0000 mg/m2 | Freq: Once | INTRAVENOUS | Status: AC
  Administered 2012-01-28: 288 mg via INTRAVENOUS
  Filled 2012-01-28: qty 48

## 2012-01-28 MED ORDER — FAMOTIDINE IN NACL 20-0.9 MG/50ML-% IV SOLN
20.0000 mg | Freq: Once | INTRAVENOUS | Status: AC
  Administered 2012-01-28: 20 mg via INTRAVENOUS

## 2012-01-28 MED ORDER — DEXAMETHASONE SODIUM PHOSPHATE 4 MG/ML IJ SOLN
20.0000 mg | Freq: Once | INTRAMUSCULAR | Status: AC
  Administered 2012-01-28: 20 mg via INTRAVENOUS

## 2012-01-28 MED ORDER — HEPARIN SOD (PORK) LOCK FLUSH 100 UNIT/ML IV SOLN
500.0000 [IU] | Freq: Once | INTRAVENOUS | Status: AC | PRN
  Administered 2012-01-28: 500 [IU]
  Filled 2012-01-28: qty 5

## 2012-01-28 NOTE — Progress Notes (Signed)
OFFICE PROGRESS NOTE  CC  KNAPP,[name], MD [address] [place] [address] Dr. Emelia Loron Dr. 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  PRIOR THERAPY: #1. S/P left breast lumpectomy in March 2011 after she had a screen detected 1.0 cm ER+, PR+, intermediate grade DCIS. Patient had 3 sentinel biopsied all of them were negative for metastatic disease. Patient underwent radiation therapy between 06/05/2009 through 07/02/2009. She was then begun on tamoxifen 20 mg daily in August 2011.  #2 new diagnosis of gynecologic malignancy presenting with abdominal mass and pleural effusions. Patient was recently hospitalized with shortness of breath she was discovered to have malignant pleural effusion she is status post Pleurx catheter is. She also had malignant ascites she's had several paracentesis procedures performed during her hospitalization. During her hospitalization she was seen by gynecologic oncology. She also went on to receive chemotherapy consisting of Taxol and carboplatinum this is her cycle 1. Her chemotherapy will be given every 21 days with day 2 Neulasta.    CURRENT THERAPY: Cycle 2 day 1 of Taxol and carboplatinum with Neulasta support  INTERVAL HISTORY: Yisroel Ramming 66 y.o. female returns for follow up visit. [name] doing very well.  Her pleurx catheters now get drained only twice per week. She has completed her therapy sessions with home health PT and is continuing to do exercises daily.  She has a tiny knot on her right forearm that appeared yesterday, she had a previous lab stick in that area.  Otherwise she is well and without concerns.    MEDICAL HISTORY: Past Medical History  Diagnosis Date  . Interstitial cystitis     on chronic antibiotics  . Hyperlipidemia   . Frequent UTI     on prophylaxis  . 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 CAD (coronary artery disease)     a. s/p CABG  7/13;   b.  LHC 12/01/11:  pLAD 70%, mLAD 40%, CFX 40-50% prior to takeoff of the OM2, oRCA occluded, mid vessel filled via R->R collaterals and distal vessel filled by L->R collaterals, S-OM1/OM2 (small and diffusely dz) with mid 90% stenosis, 90% at OM1 anastomotic site, continuation of OM2 occluded, S-Dx patent, S-PDA occluded, L-LAD ok, EF 55-65%  => Med Rx rec.  [name] Hx of echocardiogram     Echo 5/13: EF 60-65%, grade 2 diastolic dysfunction  . Hypercholesterolemia   . Pleural effusion 12/28/11  . GERD (gastroesophageal reflux disease)   . Arthritis     "just a little; lower back" (12/28/11)  . Gout attack 08/2011    related to "stress post OHS"  . Depression   . Breast cancer     "left"; on Tamoxifen  . S/P thoracentesis 12/28/11    "for pleural effusion" (12/28/2011)  . Ovarian cancer     ALLERGIES:  is allergic to codeine; isosorbide; other; phenothiazines; talwin; and yellow jacket venom.  MEDICATIONS:  Current Outpatient Prescriptions  Medication Sig Dispense Refill  . ALPRAZolam (XANAX) 0.25 MG tablet Take 1 tablet (0.25 mg total) by mouth every 8 (eight) hours as needed for anxiety.  30 tablet  0  . Alum & Mag Hydroxide-Simeth (MAGIC MOUTHWASH) SOLN Take 5 mLs by mouth 4 (four) times daily as needed.  80 mL  6  . aspirin 325 MG tablet Take 325 mg by mouth daily.      [name] atorvastatin (LIPITOR) 20 MG tablet Take 20 mg by mouth daily.      [name]  carvedilol (COREG) 25 MG tablet Take 25 mg by mouth 2 (two) times daily.      [name] dexamethasone (DECADRON) 4 MG tablet Take 2 tablets (8 mg total) by mouth 2 (two) times daily with a meal. Take two times a day starting the day after chemotherapy for 3 days.  30 tablet  1  . dexamethasone (DECADRON) 4 MG tablet Take 1 by mouth daily  30 tablet  6  . Docusate Calcium (STOOL SOFTENER PO) Take 2 capsules by mouth every morning.      [name] EPINEPHrine (EPIPEN 2-PAK) 0.3 mg/0.3 mL DEVI Inject 0.3 mg into the muscle.       [name] HYDROcodone-acetaminophen  (NORCO/VICODIN) 5-325 MG per tablet Take 1-2 tablets by mouth every 4 (four) hours as needed.  30 tablet  0  . LORazepam (ATIVAN) 0.5 MG tablet Take 1 tablet (0.5 mg total) by mouth every 6 (six) hours as needed (Nausea or vomiting).  30 tablet  0  . megestrol (MEGACE ES) 625 MG/5ML suspension Take 5 mLs (625 mg total) by mouth daily.  150 mL  0  . nitroGLYCERIN (NITROSTAT) 0.4 MG SL tablet Place 0.4 mg under the tongue every 5 (five) minutes as needed. Chest pain      . nystatin (MYCOSTATIN) 100000 UNIT/ML suspension Take 5 mLs (500,000 Units total) by mouth 4 (four) times daily.  60 mL  6  . omeprazole (PRILOSEC) 20 MG capsule Take 20 mg by mouth daily.      . ondansetron (ZOFRAN) 8 MG tablet Take 1 tablet (8 mg total) by mouth 2 (two) times daily. Take two times a day starting the day after chemo for 3 days. Then take two times a day as needed for nausea or vomiting.  30 tablet  1  . oxybutynin (DITROPAN-XL) 10 MG 24 hr tablet Take 10 mg by mouth daily.      Bertram Gala Glycol-Propyl Glycol 0.4-0.3 % SOLN Place 1 drop into both eyes every morning.       . trimethoprim (TRIMPEX) 100 MG tablet Take 100 mg by mouth daily.      [name] zolpidem (AMBIEN) 10 MG tablet Take 1 tablet (10 mg total) by mouth at bedtime as needed for sleep.  30 tablet  0   No current facility-administered medications for this visit.   Facility-Administered Medications Ordered in Other Visits  Medication Dose Route Frequency Provider Last Rate Last Dose  . influenza  inactive virus vaccine (FLUZONE/FLUARIX) injection 0.5 mL  0.5 mL Intramuscular Once Joselyn Arrow, MD        SURGICAL HISTORY:  Past Surgical History  Procedure Date  . Breast lumpectomy 04/2009    left  . Cesarean section 1973; 1976  . Muscle release 1960    L neck and chest.; "when I was 13; pneumonia settled in my left neck"  . Coronary artery bypass graft 08/18/2011    Procedure: CORONARY ARTERY BYPASS GRAFTING (CABG);  Surgeon: Alleen Borne, MD;  Location:  [facility] OR;  Service: Open Heart Surgery;  Laterality: N/A;  Coronary Artery Bypass Graft times five utilizing the left internal mammary artery and the left greater saphenous vein harvested endoscopically.  . Abdominal hysterectomy 1976  . Appendectomy 1976  . Portacath placement 01/06/2012    Procedure: INSERTION PORT-A-CATH;  Surgeon: Alleen Borne, MD;  Location: [facility] OR;  Service: Thoracic;  Laterality: Left;  . Chest tube insertion 01/06/2012    Procedure: INSERTION PLEURAL DRAINAGE CATHETER;  Surgeon: Alleen Borne, MD;  Location: [facility] OR;  Service: Thoracic;  Laterality: Bilateral;    REVIEW OF SYSTEMS:   General: fatigue (-), night sweats (-), fever (-), pain (-) Lymph: palpable nodes (-) HEENT: vision changes (-), mucositis (-), gum bleeding (-), epistaxis (-) Cardiovascular: chest pain (-), palpitations (-) Pulmonary: shortness of breath (-), dyspnea on exertion (-), cough (-), hemoptysis (-) GI:  Early satiety (-), melena (-), dysphagia (-), nausea/vomiting (-), diarrhea (-) GU: dysuria (-), hematuria (-), incontinence (-) Musculoskeletal: joint swelling (-), joint pain (-), back pain (-) Neuro: weakness (-), numbness (-), headache (-), confusion (-) Skin: Rash (-), lesions (-), dryness (-) Psych: depression (-), suicidal/homicidal ideation (-), feeling of hopelessness (-)   PHYSICAL EXAMINATION:  BP 117/74  Pulse 87  Temp 98 F (36.7 C) (Oral)  Resp 20  Ht 5\' 6"  (1.676 m)  Wt 132 lb (59.875 kg)  BMI 21.31 kg/m2 General: Patient is an ill appearing female in no acute distress HEENT: PERRLA, sclerae anicteric no conjunctival pallor, MMM Neck: supple, no palpable adenopathy Lungs: clear to auscultation bilaterally, no wheezes, rhonchi, or rales Cardiovascular: regular rate rhythm, S1, S2, no murmurs, rubs or gallops Abdomen: Soft, non-tender, non-distended, normoactive bowel sounds, no HSM, pleurx dressings clean dry and intact Extremities: warm and well perfused, no clubbing,  cyanosis, or edema Skin: No rashes or lesions Neuro: Non-focal Bilateral Breast Exam: no masses or nipple discharge, no skin changes, left breast has a well healaed scar, no nodularity or masses. ECOG PERFORMANCE STATUS: 0 - Asymptomatic  LABORATORY DATA: Lab Results  Component Value Date   WBC 16.8* 01/28/2012   HGB 11.9 01/28/2012   HCT 37.2 01/28/2012   MCV 84.0 01/28/2012   PLT 353 01/28/2012      Chemistry      Component Value Date/Time   NA 139 01/18/2012 1141   NA 131* 01/07/2012 1746   NA 141 12/17/2011 1144   K 3.8 01/18/2012 1141   K 4.3 01/07/2012 1746   CL 105 01/18/2012 1141   CL 96 01/07/2012 1746   CO2 21* 01/18/2012 1141   CO2 27 01/07/2012 1746   BUN 16.0 01/18/2012 1141   BUN 19 01/07/2012 1746   BUN 15 12/17/2011 1144   CREATININE 0.6 01/18/2012 1141   CREATININE 0.61 01/07/2012 1746      Component Value Date/Time   CALCIUM 8.4 01/18/2012 1141   CALCIUM 8.1* 01/07/2012 1746   ALKPHOS 69 01/12/2012 1044   ALKPHOS 64 01/07/2012 1746   AST 17 01/12/2012 1044   AST 16 01/07/2012 1746   ALT 19 01/12/2012 1044   ALT 11 01/07/2012 1746   BILITOT 0.30 01/12/2012 1044   BILITOT 0.1* 01/07/2012 1746       RADIOGRAPHIC STUDIES:  No results found.  ASSESSMENT: 66 year old female with:  #1 DCIS of the breast. She is on tamoxifen 20 mg daily. However I have recommended that we discontinue the tamoxifen for now a do to her recent diagnosis of ovarian cancer.  #2 recent diagnosis of gynecologic malignancy patient is status post cycle 1 of Taxol and carboplatinum given during her recent hospitalization. Overall she tolerated it well.  #3 anemia  #4  Severe malnutrition   #5 oral dental hygiene    PLAN:  #1 Ms Hamler is doing well today.  She will proceed with her second cycle of chemotherapy.    #2 Her anemia is much improved.  Her hemoglobin is normal today.    #3 She is eating very well and has  stopped the Megace.    #4 She will continue using  the magic mouthwash.  Her mucositis is resolved.    #5 she will return in 02/03/2012 for interim labs.  All questions were answered. The patient knows to call the clinic with any problems, questions or concerns. We can certainly see the patient much sooner if necessary.  I spent 25 minutes counseling the patient face to face. The total time spent in the appointment was 30 minutes.  This case was reviewed with Dr. Welton Flakes.   Cherie Ouch Lyn Hollingshead, NP Medical Oncology [facility] Phone: [phone]  01/28/2012, 8:54 AM

## 2012-01-28 NOTE — Patient Instructions (Addendum)
[facility] Discharge Instructions for Patients Receiving Chemotherapy  Today you received the following chemotherapy agents Taxol and Carboplatin  To help prevent nausea and vomiting after your treatment, we encourage you to take your nausea medication as directed.   If you develop nausea and vomiting that is not controlled by your nausea medication, call the clinic. If it is after clinic hours your family physician or the after hours number for the clinic or go to the Emergency Department.   BELOW ARE SYMPTOMS THAT SHOULD BE REPORTED IMMEDIATELY:  *FEVER GREATER THAN 100.5 F  *CHILLS WITH OR WITHOUT FEVER  NAUSEA AND VOMITING THAT IS NOT CONTROLLED WITH YOUR NAUSEA MEDICATION  *UNUSUAL SHORTNESS OF BREATH  *UNUSUAL BRUISING OR BLEEDING  TENDERNESS IN MOUTH AND THROAT WITH OR WITHOUT PRESENCE OF ULCERS  *URINARY PROBLEMS  *BOWEL PROBLEMS  UNUSUAL RASH Items with * indicate a potential emergency and should be followed up as soon as possible.  Feel free to call the clinic you have any questions or concerns. The clinic phone number is [phone].   I have been informed and understand all the instructions given to me. I know to contact the clinic, my physician, or go to the Emergency Department if any problems should occur. I do not have any questions at this time, but understand that I may call the clinic during office hours   should I have any questions or need assistance in obtaining follow up care.

## 2012-01-28 NOTE — Progress Notes (Signed)
FMLA Papers completed ans given to patient and her husband.

## 2012-01-28 NOTE — Patient Instructions (Addendum)
Doing well.  Proceed with chemotherapy.  We will see you back next week for labs and an appt. Please call us if you have any questions or concerns.    

## 2012-01-29 ENCOUNTER — Other Ambulatory Visit: Payer: Self-pay | Admitting: Oncology

## 2012-01-29 ENCOUNTER — Ambulatory Visit (HOSPITAL_BASED_OUTPATIENT_CLINIC_OR_DEPARTMENT_OTHER): Payer: BC Managed Care – PPO

## 2012-01-29 VITALS — BP 141/83 | HR 79 | Temp 97.6°F | Resp 18

## 2012-01-29 DIAGNOSIS — C569 Malignant neoplasm of unspecified ovary: Secondary | ICD-10-CM

## 2012-01-29 DIAGNOSIS — Z5189 Encounter for other specified aftercare: Secondary | ICD-10-CM

## 2012-01-29 MED ORDER — PEGFILGRASTIM INJECTION 6 MG/0.6ML
6.0000 mg | Freq: Once | SUBCUTANEOUS | Status: AC
  Administered 2012-01-29: 6 mg via SUBCUTANEOUS

## 2012-02-01 ENCOUNTER — Encounter: Payer: Self-pay | Admitting: Specialist

## 2012-02-01 NOTE — Progress Notes (Signed)
I spoke with [name] [name] by phone.  She was tearful, explaining that this experience has drastically changed the way she lives her life, which is normally very involved and busy.  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  She also has a new grandchild.  She said, however, that she is a bit better today because she has decided she is going to live and that she has many more things to do in her life.  She also said she is realistic about her prognosis long-term.  I told [name] [name] about the services offered by the [facility] and encouraged her to attend the GYN Oncology Support Group.  She seemed very willing to come to that meeting in January, saying that she feels it would be helpful to learn from others what "normal" is in this situation.  I also encouraged her to seek counseling support if she feels it would be helpful.  She says she is not angry but that she is "incredibly sad" about her cancer.

## 2012-02-03 ENCOUNTER — Ambulatory Visit (HOSPITAL_BASED_OUTPATIENT_CLINIC_OR_DEPARTMENT_OTHER): Payer: BC Managed Care – PPO | Admitting: Adult Health

## 2012-02-03 ENCOUNTER — Ambulatory Visit (HOSPITAL_COMMUNITY)
Admission: RE | Admit: 2012-02-03 | Discharge: 2012-02-03 | Disposition: A | Discharge status: Home / Self Care | Payer: BC Managed Care – PPO | Source: Ambulatory Visit | Attending: Adult Health | Admitting: Adult Health

## 2012-02-03 ENCOUNTER — Telehealth: Payer: Self-pay | Admitting: *Deleted

## 2012-02-03 ENCOUNTER — Other Ambulatory Visit (HOSPITAL_BASED_OUTPATIENT_CLINIC_OR_DEPARTMENT_OTHER): Payer: BC Managed Care – PPO | Admitting: Lab

## 2012-02-03 ENCOUNTER — Ambulatory Visit (HOSPITAL_BASED_OUTPATIENT_CLINIC_OR_DEPARTMENT_OTHER): Payer: BC Managed Care – PPO

## 2012-02-03 ENCOUNTER — Encounter: Payer: Self-pay | Admitting: Adult Health

## 2012-02-03 VITALS — BP 103/69 | HR 97 | Temp 98.2°F | Resp 20 | Ht 66.0 in | Wt 131.1 lb

## 2012-02-03 DIAGNOSIS — R0600 Dyspnea, unspecified: Secondary | ICD-10-CM

## 2012-02-03 DIAGNOSIS — C569 Malignant neoplasm of unspecified ovary: Secondary | ICD-10-CM

## 2012-02-03 DIAGNOSIS — D0590 Unspecified type of carcinoma in situ of unspecified breast: Secondary | ICD-10-CM

## 2012-02-03 DIAGNOSIS — R0602 Shortness of breath: Secondary | ICD-10-CM | POA: Insufficient documentation

## 2012-02-03 DIAGNOSIS — R0989 Other specified symptoms and signs involving the circulatory and respiratory systems: Secondary | ICD-10-CM | POA: Insufficient documentation

## 2012-02-03 DIAGNOSIS — R5383 Other fatigue: Secondary | ICD-10-CM

## 2012-02-03 DIAGNOSIS — C786 Secondary malignant neoplasm of retroperitoneum and peritoneum: Secondary | ICD-10-CM

## 2012-02-03 DIAGNOSIS — E860 Dehydration: Secondary | ICD-10-CM

## 2012-02-03 DIAGNOSIS — R0609 Other forms of dyspnea: Secondary | ICD-10-CM | POA: Insufficient documentation

## 2012-02-03 DIAGNOSIS — R5381 Other malaise: Secondary | ICD-10-CM

## 2012-02-03 DIAGNOSIS — D649 Anemia, unspecified: Secondary | ICD-10-CM

## 2012-02-03 LAB — COMPREHENSIVE METABOLIC PANEL (CC13)
ALT: 27 U/L (ref 0–55)
Alkaline Phosphatase: 98 U/L (ref 40–150)
CO2: 21 mEq/L — ABNORMAL LOW (ref 22–29)
Sodium: 130 mEq/L — ABNORMAL LOW (ref 136–145)
Total Bilirubin: 0.51 mg/dL (ref 0.20–1.20)
Total Protein: 6.1 g/dL — ABNORMAL LOW (ref 6.4–8.3)

## 2012-02-03 LAB — CBC WITH DIFFERENTIAL/PLATELET
Basophils Absolute: 0 10*3/uL (ref 0.0–0.1)
EOS%: 0.2 % (ref 0.0–7.0)
Eosinophils Absolute: 0 10*3/uL (ref 0.0–0.5)
HCT: 40.4 % (ref 34.8–46.6)
HGB: 13.2 g/dL (ref 11.6–15.9)
MCH: 27.4 pg (ref 25.1–34.0)
MONO#: 0.5 10*3/uL (ref 0.1–0.9)
NEUT#: 17.7 10*3/uL — ABNORMAL HIGH (ref 1.5–6.5)
NEUT%: 91 % — ABNORMAL HIGH (ref 38.4–76.8)
RDW: 19.1 % — ABNORMAL HIGH (ref 11.2–14.5)
WBC: 19.4 10*3/uL — ABNORMAL HIGH (ref 3.9–10.3)
lymph#: 1.2 10*3/uL (ref 0.9–3.3)

## 2012-02-03 MED ORDER — SODIUM CHLORIDE 0.9 % IJ SOLN
10.0000 mL | INTRAMUSCULAR | Status: DC | PRN
  Administered 2012-02-03: 10 mL via INTRAVENOUS
  Filled 2012-02-03: qty 10

## 2012-02-03 MED ORDER — HEPARIN SOD (PORK) LOCK FLUSH 100 UNIT/ML IV SOLN
500.0000 [IU] | Freq: Once | INTRAVENOUS | Status: AC
  Administered 2012-02-03: 500 [IU] via INTRAVENOUS
  Filled 2012-02-03: qty 5

## 2012-02-03 MED ORDER — SODIUM CHLORIDE 0.9 % IV SOLN
1000.0000 mL | Freq: Once | INTRAVENOUS | Status: AC
  Administered 2012-02-03: 1000 mL via INTRAVENOUS

## 2012-02-03 NOTE — Patient Instructions (Addendum)
You will receive IV fluids today.  Please call us if you have any questions or concerns.  We will see you back on 02/18/12.

## 2012-02-03 NOTE — Patient Instructions (Signed)
Dehydration, Adult Dehydration is when you lose more fluids from the body than you take in. Vital organs like the kidneys, brain, and heart cannot function without a proper amount of fluids and salt. Any loss of fluids from the body can cause dehydration.  CAUSES   Vomiting.  Diarrhea.  Excessive sweating.  Excessive urine output.  Fever. SYMPTOMS  Mild dehydration  Thirst.  Dry lips.  Slightly dry mouth. Moderate dehydration  Very dry mouth.  Sunken eyes.  Skin does not bounce back quickly when lightly pinched and released.  Dark urine and decreased urine production.  Decreased tear production.  Headache. Severe dehydration  Very dry mouth.  Extreme thirst.  Rapid, weak pulse (more than 100 beats per minute at rest).  Cold hands and feet.  Not able to sweat in spite of heat and temperature.  Rapid breathing.  Blue lips.  Confusion and lethargy.  Difficulty being awakened.  Minimal urine production.  No tears. DIAGNOSIS  Your caregiver will diagnose dehydration based on your symptoms and your exam. Blood and urine tests will help confirm the diagnosis. The diagnostic evaluation should also identify the cause of dehydration. TREATMENT  Treatment of mild or moderate dehydration can often be done at home by increasing the amount of fluids that you drink. It is best to drink small amounts of fluid more often. Drinking too much at one time can make vomiting worse. Refer to the home care instructions below. Severe dehydration needs to be treated at the hospital where you will probably be given intravenous (IV) fluids that contain water and electrolytes. HOME CARE INSTRUCTIONS   Ask your caregiver about specific rehydration instructions.  Drink enough fluids to keep your urine clear or pale yellow.  Drink small amounts frequently if you have nausea and vomiting.  Eat as you normally do.  Avoid:  Foods or drinks high in sugar.  Carbonated  drinks.  Juice.  Extremely hot or cold fluids.  Drinks with caffeine.  Fatty, greasy foods.  Alcohol.  Tobacco.  Overeating.  Gelatin desserts.  Wash your hands well to avoid spreading bacteria and viruses.  Only take over-the-counter or prescription medicines for pain, discomfort, or fever as directed by your caregiver.  Ask your caregiver if you should continue all prescribed and over-the-counter medicines.  Keep all follow-up appointments with your caregiver. SEEK MEDICAL CARE IF:  You have abdominal pain and it increases or stays in one area (localizes).  You have a rash, stiff neck, or severe headache.  You are irritable, sleepy, or difficult to awaken.  You are weak, dizzy, or extremely thirsty. SEEK IMMEDIATE MEDICAL CARE IF:   You are unable to keep fluids down or you get worse despite treatment.  You have frequent episodes of vomiting or diarrhea.  You have blood or green matter (bile) in your vomit.  You have blood in your stool or your stool looks black and tarry.  You have not urinated in 6 to 8 hours, or you have only urinated a small amount of very dark urine.  You have a fever.  You faint. MAKE SURE YOU:   Understand these instructions.  Will watch your condition.  Will get help right away if you are not doing well or get worse. Document Released: 02/01/2005 Document Revised: 04/26/2011 Document Reviewed: 09/21/2010 Exit[employer] Patient Information 2013 [employer].  

## 2012-02-03 NOTE — Progress Notes (Signed)
OFFICE PROGRESS NOTE  CC  KNAPP,[name], MD [address] [place] [address] Dr. Emelia Loron Dr. 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  PRIOR THERAPY: #1. S/P left breast lumpectomy in March 2011 after she had a screen detected 1.0 cm ER+, PR+, intermediate grade DCIS. Patient had 3 sentinel biopsied all of them were negative for metastatic disease. Patient underwent radiation therapy between 06/05/2009 through 07/02/2009. She was then begun on tamoxifen 20 mg daily in August 2011.  #2 new diagnosis of gynecologic malignancy presenting with abdominal mass and pleural effusions. Patient was recently hospitalized with shortness of breath she was discovered to have malignant pleural effusion she is status post Pleurx catheter is. She also had malignant ascites she's had several paracentesis procedures performed during her hospitalization. During her hospitalization she was seen by gynecologic oncology. She also went on to receive chemotherapy consisting of Taxol and carboplatinum this is her cycle 1. Her chemotherapy will be given every 21 days with day 2 Neulasta.    CURRENT THERAPY: Cycle 2 day 8 of Taxol and carboplatinum with Neulasta support  INTERVAL HISTORY: [name] [name] 67 y.o. female returns for follow up visit. [name] very weak, very tired, and short of breath.  [name] only draining her effusions weekly, and the left one has stopped producing output.  [name] having a hard time completing her sentences without getting short of breath.  She also has pain in her mouth, and has frequent bowel movements, after every meal with cramping.  Otherwise she's feeling well and w/o concerns.    MEDICAL HISTORY: Past Medical History  Diagnosis Date  . Interstitial cystitis     on chronic antibiotics  . Hyperlipidemia   . Frequent UTI     on prophylaxis  . 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 CAD (coronary  artery disease)     a. s/p CABG 7/13;   b.  LHC 12/01/11:  pLAD 70%, mLAD 40%, CFX 40-50% prior to takeoff of the OM2, oRCA occluded, mid vessel filled via R->R collaterals and distal vessel filled by L->R collaterals, S-OM1/OM2 (small and diffusely dz) with mid 90% stenosis, 90% at OM1 anastomotic site, continuation of OM2 occluded, S-Dx patent, S-PDA occluded, L-LAD ok, EF 55-65%  => Med Rx rec.  [name] Hx of echocardiogram     Echo 5/13: EF 60-65%, grade 2 diastolic dysfunction  . Hypercholesterolemia   . Pleural effusion 12/28/11  . GERD (gastroesophageal reflux disease)   . Arthritis     "just a little; lower back" (12/28/11)  . Gout attack 08/2011    related to "stress post OHS"  . Depression   . Breast cancer     "left"; on Tamoxifen  . S/P thoracentesis 12/28/11    "for pleural effusion" (12/28/2011)  . Ovarian cancer     ALLERGIES:  is allergic to codeine; isosorbide; other; phenothiazines; talwin; and yellow jacket venom.  MEDICATIONS:  Current Outpatient Prescriptions  Medication Sig Dispense Refill  . ALPRAZolam (XANAX) 0.25 MG tablet Take 1 tablet (0.25 mg total) by mouth every 8 (eight) hours as needed for anxiety.  30 tablet  0  . Alum & Mag Hydroxide-Simeth (MAGIC MOUTHWASH) SOLN Take 5 mLs by mouth 4 (four) times daily as needed.  80 mL  6  . aspirin 325 MG tablet Take 325 mg by mouth daily.      [name] atorvastatin (LIPITOR) 20 MG tablet Take 20 mg by  mouth daily.      . carvedilol (COREG) 25 MG tablet Take 25 mg by mouth 2 (two) times daily.      [name] dexamethasone (DECADRON) 4 MG tablet Take 2 tablets (8 mg total) by mouth 2 (two) times daily with a meal. Take two times a day starting the day after chemotherapy for 3 days.  30 tablet  1  . dexamethasone (DECADRON) 4 MG tablet Take 1 by mouth daily  30 tablet  6  . Docusate Calcium (STOOL SOFTENER PO) Take 2 capsules by mouth every morning.      [name] EPINEPHrine (EPIPEN 2-PAK) 0.3 mg/0.3 mL DEVI Inject 0.3 mg into the muscle.        [name] HYDROcodone-acetaminophen (NORCO/VICODIN) 5-325 MG per tablet Take 1-2 tablets by mouth every 4 (four) hours as needed.  30 tablet  0  . LORazepam (ATIVAN) 0.5 MG tablet Take 1 tablet (0.5 mg total) by mouth every 6 (six) hours as needed (Nausea or vomiting).  30 tablet  0  . megestrol (MEGACE ES) 625 MG/5ML suspension Take 5 mLs (625 mg total) by mouth daily.  150 mL  0  . nitroGLYCERIN (NITROSTAT) 0.4 MG SL tablet Place 0.4 mg under the tongue every 5 (five) minutes as needed. Chest pain      . nystatin (MYCOSTATIN) 100000 UNIT/ML suspension Take 5 mLs (500,000 Units total) by mouth 4 (four) times daily.  60 mL  6  . omeprazole (PRILOSEC) 20 MG capsule Take 20 mg by mouth daily.      . ondansetron (ZOFRAN) 8 MG tablet Take 1 tablet (8 mg total) by mouth 2 (two) times daily. Take two times a day starting the day after chemo for 3 days. Then take two times a day as needed for nausea or vomiting.  30 tablet  1  . oxybutynin (DITROPAN-XL) 10 MG 24 hr tablet Take 10 mg by mouth daily.      Bertram Gala Glycol-Propyl Glycol 0.4-0.3 % SOLN Place 1 drop into both eyes every morning.       . trimethoprim (TRIMPEX) 100 MG tablet Take 100 mg by mouth daily.      [name] zolpidem (AMBIEN) 10 MG tablet Take 1 tablet (10 mg total) by mouth at bedtime as needed for sleep.  30 tablet  0   No current facility-administered medications for this visit.   Facility-Administered Medications Ordered in Other Visits  Medication Dose Route Frequency Provider Last Rate Last Dose  . 0.9 %  sodium chloride infusion   Intravenous Continuous Victorino December, MD      . influenza  inactive virus vaccine (FLUZONE/FLUARIX) injection 0.5 mL  0.5 mL Intramuscular Once Joselyn Arrow, MD        SURGICAL HISTORY:  Past Surgical History  Procedure Date  . Breast lumpectomy 04/2009    left  . Cesarean section 1973; 1976  . Muscle release 1960    L neck and chest.; "when I was 13; pneumonia settled in my left neck"  . Coronary artery  bypass graft 08/18/2011    Procedure: CORONARY ARTERY BYPASS GRAFTING (CABG);  Surgeon: Alleen Borne, MD;  Location: [facility] OR;  Service: Open Heart Surgery;  Laterality: N/A;  Coronary Artery Bypass Graft times five utilizing the left internal mammary artery and the left greater saphenous vein harvested endoscopically.  . Abdominal hysterectomy 1976  . Appendectomy 1976  . Portacath placement 01/06/2012    Procedure: INSERTION PORT-A-CATH;  Surgeon: Alleen Borne, MD;  Location: [facility]  OR;  Service: Thoracic;  Laterality: Left;  . Chest tube insertion 01/06/2012    Procedure: INSERTION PLEURAL DRAINAGE CATHETER;  Surgeon: Alleen Borne, MD;  Location: [facility] OR;  Service: Thoracic;  Laterality: Bilateral;    REVIEW OF SYSTEMS:   General: fatigue (+), night sweats (-), fever (-), pain (-) Lymph: palpable nodes (-) HEENT: vision changes (-), mucositis (-), gum bleeding (-), epistaxis (-) Cardiovascular: chest pain (-), palpitations (-) Pulmonary: shortness of breath (+), dyspnea on exertion (-), cough (-), hemoptysis (-) GI:  Early satiety (-), melena (-), dysphagia (-), nausea/vomiting (-), diarrhea (-) GU: dysuria (-), hematuria (-), incontinence (-) Musculoskeletal: joint swelling (-), joint pain (-), back pain (-) Neuro: weakness (-), numbness (-), headache (-), confusion (-) Skin: Rash (-), lesions (-), dryness (-) Psych: depression (-), suicidal/homicidal ideation (-), feeling of hopelessness (-)   PHYSICAL EXAMINATION:  BP 103/69  Pulse 97  Temp 98.2 F (36.8 C) (Oral)  Resp 20  Ht 5\' 6"  (1.676 m)  Wt 131 lb 1.6 oz (59.467 kg)  BMI 21.16 kg/m2 General: Patient is an ill appearing female in no acute distress HEENT: PERRLA, sclerae anicteric no conjunctival pallor, MMM Neck: supple, no palpable adenopathy Lungs: clear to auscultation bilaterally, no wheezes, rhonchi, or rales Cardiovascular: regular rate rhythm, S1, S2, no murmurs, rubs or gallops Abdomen: Soft, non-tender,  non-distended, normoactive bowel sounds, no HSM, pleurx dressings clean dry and intact Extremities: warm and well perfused, no clubbing, cyanosis, or edema Skin: No rashes or lesions Neuro: Non-focal Bilateral Breast Exam: no masses or nipple discharge, no skin changes, left breast has a well healaed scar, no nodularity or masses. ECOG PERFORMANCE STATUS: 0 - Asymptomatic  LABORATORY DATA: Lab Results  Component Value Date   WBC 19.4* 02/03/2012   HGB 13.2 02/03/2012   HCT 40.4 02/03/2012   MCV 83.9 02/03/2012   PLT 372 02/03/2012      Chemistry      Component Value Date/Time   NA 130* 02/03/2012 1257   NA 131* 01/07/2012 1746   NA 141 12/17/2011 1144   K 4.0 02/03/2012 1257   K 4.3 01/07/2012 1746   CL 98 02/03/2012 1257   CL 96 01/07/2012 1746   CO2 21* 02/03/2012 1257   CO2 27 01/07/2012 1746   BUN 29.0* 02/03/2012 1257   BUN 19 01/07/2012 1746   BUN 15 12/17/2011 1144   CREATININE 0.7 02/03/2012 1257   CREATININE 0.61 01/07/2012 1746      Component Value Date/Time   CALCIUM 8.9 02/03/2012 1257   CALCIUM 8.1* 01/07/2012 1746   ALKPHOS 98 02/03/2012 1257   ALKPHOS 64 01/07/2012 1746   AST 19 02/03/2012 1257   AST 16 01/07/2012 1746   ALT 27 02/03/2012 1257   ALT 11 01/07/2012 1746   BILITOT 0.51 02/03/2012 1257   BILITOT 0.1* 01/07/2012 1746       RADIOGRAPHIC STUDIES:  No results found.  ASSESSMENT: 66 year old female with:  #1 DCIS of the breast. She is on tamoxifen 20 mg daily. However I have recommended that we discontinue the tamoxifen for now a do to her recent diagnosis of ovarian cancer.  #2 recent diagnosis of gynecologic malignancy patient is status post cycle 1 of Taxol and carboplatinum given during her recent hospitalization. Overall she tolerated it well.  #3 anemia  #4  Severe malnutrition   #5 oral dental hygiene/thrush  PLAN:  #1 Due to the shortness of breath we got a chest x ray and that  was clear.  She will receive IV fluids for  weakness.    #2 Her anemia is much improved.  Her hemoglobin is normal today.    #3 She is eating very well and has stopped the Megace.    #4 She will continue using the magic mouthwash.  She will restart the Nystatin four times a day.   #5 she will return on 02/18/12 for labs.    All questions were answered. The patient knows to call the clinic with any problems, questions or concerns. We can certainly see the patient much sooner if necessary.  I spent 40 minutes counseling the patient face to face. The total time spent in the appointment was 30 minutes.  This case was reviewed with Dr. Welton Flakes.   Cherie Ouch Lyn Hollingshead, NP Medical Oncology [facility] Phone: [phone]  02/03/2012, 3:06 PM

## 2012-02-03 NOTE — Telephone Encounter (Signed)
Gave patient instructions on getting her chest x-ray

## 2012-02-07 ENCOUNTER — Telehealth: Payer: Self-pay | Admitting: *Deleted

## 2012-02-07 NOTE — Telephone Encounter (Signed)
Tresa Endo from [facility] called. Pt reports having N/V/D over weekend. Took Immodium x2. Diarrhea resolved. Pt still have N/V. Recommended pt take Zofran/Compazine, if unable to keep medicaton down using Compazine suppository. Tresa Endo, relayed message to pt who advised she never filled Any of the three medications and does not have at home. Informed pt, I will call pharmacy and have them fill Rx from 01/07/12 written by MD as she did not fill at that time. To continue to eat small frequent meals, encouraged fluids. Continue to take Immodium  If diarrhea persists. Pt to expect call from scheduling for appt for pt to receive fluids this week. Onc Tx Sent

## 2012-02-07 NOTE — Telephone Encounter (Signed)
Per staff message and POF I have scheduled appts. I have called the patient.    JMW

## 2012-02-07 NOTE — Telephone Encounter (Signed)
Schedule pt for IVF this week. Please call pt with new appt date/time  sent michelle email to set up patient for fluids

## 2012-02-11 ENCOUNTER — Ambulatory Visit (HOSPITAL_BASED_OUTPATIENT_CLINIC_OR_DEPARTMENT_OTHER): Payer: BC Managed Care – PPO

## 2012-02-11 VITALS — BP 122/82 | HR 89 | Temp 98.1°F | Resp 18

## 2012-02-11 DIAGNOSIS — R197 Diarrhea, unspecified: Secondary | ICD-10-CM

## 2012-02-11 DIAGNOSIS — C569 Malignant neoplasm of unspecified ovary: Secondary | ICD-10-CM

## 2012-02-11 DIAGNOSIS — R112 Nausea with vomiting, unspecified: Secondary | ICD-10-CM

## 2012-02-11 DIAGNOSIS — E860 Dehydration: Secondary | ICD-10-CM

## 2012-02-11 DIAGNOSIS — D0590 Unspecified type of carcinoma in situ of unspecified breast: Secondary | ICD-10-CM

## 2012-02-11 LAB — AFB CULTURE WITH SMEAR (NOT AT ARMC)

## 2012-02-11 MED ORDER — SODIUM CHLORIDE 0.9 % IV SOLN
INTRAVENOUS | Status: DC
  Administered 2012-02-11: 09:00:00 via INTRAVENOUS

## 2012-02-11 NOTE — Progress Notes (Signed)
1235 IVF infused per orders. VSS. Patient with no complaints. Discharged ambulating and AVS provided.

## 2012-02-11 NOTE — Patient Instructions (Addendum)
Dehydration, Adult Dehydration is when you lose more fluids from the body than you take in. Vital organs like the kidneys, brain, and heart cannot function without a proper amount of fluids and salt. Any loss of fluids from the body can cause dehydration.  CAUSES   Vomiting.  Diarrhea.  Excessive sweating.  Excessive urine output.  Fever. SYMPTOMS  Mild dehydration  Thirst.  Dry lips.  Slightly dry mouth. Moderate dehydration  Very dry mouth.  Sunken eyes.  Skin does not bounce back quickly when lightly pinched and released.  Dark urine and decreased urine production.  Decreased tear production.  Headache. Severe dehydration  Very dry mouth.  Extreme thirst.  Rapid, weak pulse (more than 100 beats per minute at rest).  Cold hands and feet.  Not able to sweat in spite of heat and temperature.  Rapid breathing.  Blue lips.  Confusion and lethargy.  Difficulty being awakened.  Minimal urine production.  No tears. DIAGNOSIS  Your caregiver will diagnose dehydration based on your symptoms and your exam. Blood and urine tests will help confirm the diagnosis. The diagnostic evaluation should also identify the cause of dehydration. TREATMENT  Treatment of mild or moderate dehydration can often be done at home by increasing the amount of fluids that you drink. It is best to drink small amounts of fluid more often. Drinking too much at one time can make vomiting worse. Refer to the home care instructions below. Severe dehydration needs to be treated at the hospital where you will probably be given intravenous (IV) fluids that contain water and electrolytes. HOME CARE INSTRUCTIONS   Ask your caregiver about specific rehydration instructions.  Drink enough fluids to keep your urine clear or pale yellow.  Drink small amounts frequently if you have nausea and vomiting.  Eat as you normally do.  Avoid:  Foods or drinks high in sugar.  Carbonated  drinks.  Juice.  Extremely hot or cold fluids.  Drinks with caffeine.  Fatty, greasy foods.  Alcohol.  Tobacco.  Overeating.  Gelatin desserts.  Wash your hands well to avoid spreading bacteria and viruses.  Only take over-the-counter or prescription medicines for pain, discomfort, or fever as directed by your caregiver.  Ask your caregiver if you should continue all prescribed and over-the-counter medicines.  Keep all follow-up appointments with your caregiver. SEEK MEDICAL CARE IF:  You have abdominal pain and it increases or stays in one area (localizes).  You have a rash, stiff neck, or severe headache.  You are irritable, sleepy, or difficult to awaken.  You are weak, dizzy, or extremely thirsty. SEEK IMMEDIATE MEDICAL CARE IF:   You are unable to keep fluids down or you get worse despite treatment.  You have frequent episodes of vomiting or diarrhea.  You have blood or green matter (bile) in your vomit.  You have blood in your stool or your stool looks black and tarry.  You have not urinated in 6 to 8 hours, or you have only urinated a small amount of very dark urine.  You have a fever.  You faint. MAKE SURE YOU:   Understand these instructions.  Will watch your condition.  Will get help right away if you are not doing well or get worse. Document Released: 02/01/2005 Document Revised: 04/26/2011 Document Reviewed: 09/21/2010 [employer] Patient Information 2013 [employer].  

## 2012-02-18 ENCOUNTER — Ambulatory Visit (HOSPITAL_BASED_OUTPATIENT_CLINIC_OR_DEPARTMENT_OTHER): Payer: BC Managed Care – PPO | Admitting: Adult Health

## 2012-02-18 ENCOUNTER — Other Ambulatory Visit: Payer: BC Managed Care – PPO | Admitting: Lab

## 2012-02-18 ENCOUNTER — Ambulatory Visit (HOSPITAL_BASED_OUTPATIENT_CLINIC_OR_DEPARTMENT_OTHER): Payer: BC Managed Care – PPO

## 2012-02-18 ENCOUNTER — Telehealth: Payer: Self-pay | Admitting: Oncology

## 2012-02-18 ENCOUNTER — Other Ambulatory Visit (HOSPITAL_BASED_OUTPATIENT_CLINIC_OR_DEPARTMENT_OTHER): Payer: BC Managed Care – PPO | Admitting: Lab

## 2012-02-18 ENCOUNTER — Encounter: Payer: Self-pay | Admitting: Adult Health

## 2012-02-18 VITALS — BP 125/74 | HR 88 | Temp 98.2°F | Resp 20 | Ht 66.0 in | Wt 138.7 lb

## 2012-02-18 DIAGNOSIS — D0590 Unspecified type of carcinoma in situ of unspecified breast: Secondary | ICD-10-CM

## 2012-02-18 DIAGNOSIS — C569 Malignant neoplasm of unspecified ovary: Secondary | ICD-10-CM

## 2012-02-18 DIAGNOSIS — B370 Candidal stomatitis: Secondary | ICD-10-CM

## 2012-02-18 DIAGNOSIS — C786 Secondary malignant neoplasm of retroperitoneum and peritoneum: Secondary | ICD-10-CM

## 2012-02-18 DIAGNOSIS — Z5111 Encounter for antineoplastic chemotherapy: Secondary | ICD-10-CM

## 2012-02-18 DIAGNOSIS — D649 Anemia, unspecified: Secondary | ICD-10-CM

## 2012-02-18 LAB — CBC WITH DIFFERENTIAL/PLATELET
BASO%: 0.4 % (ref 0.0–2.0)
Eosinophils Absolute: 0 10*3/uL (ref 0.0–0.5)
HCT: 34.5 % — ABNORMAL LOW (ref 34.8–46.6)
LYMPH%: 6.5 % — ABNORMAL LOW (ref 14.0–49.7)
MCHC: 31.6 g/dL (ref 31.5–36.0)
MONO#: 0.5 10*3/uL (ref 0.1–0.9)
NEUT%: 90 % — ABNORMAL HIGH (ref 38.4–76.8)
Platelets: 271 10*3/uL (ref 145–400)
WBC: 15.6 10*3/uL — ABNORMAL HIGH (ref 3.9–10.3)

## 2012-02-18 LAB — COMPREHENSIVE METABOLIC PANEL (CC13)
BUN: 25 mg/dL (ref 7.0–26.0)
CO2: 24 mEq/L (ref 22–29)
Calcium: 8.8 mg/dL (ref 8.4–10.4)
Chloride: 106 mEq/L (ref 98–107)
Creatinine: 0.6 mg/dL (ref 0.6–1.1)

## 2012-02-18 MED ORDER — ONDANSETRON 16 MG/50ML IVPB (CHCC)
16.0000 mg | Freq: Once | INTRAVENOUS | Status: AC
  Administered 2012-02-18: 16 mg via INTRAVENOUS

## 2012-02-18 MED ORDER — FAMOTIDINE IN NACL 20-0.9 MG/50ML-% IV SOLN
20.0000 mg | Freq: Once | INTRAVENOUS | Status: AC
  Administered 2012-02-18: 20 mg via INTRAVENOUS

## 2012-02-18 MED ORDER — PACLITAXEL CHEMO INJECTION 300 MG/50ML
175.0000 mg/m2 | Freq: Once | INTRAVENOUS | Status: AC
  Administered 2012-02-18: 288 mg via INTRAVENOUS
  Filled 2012-02-18: qty 48

## 2012-02-18 MED ORDER — SODIUM CHLORIDE 0.9 % IJ SOLN
10.0000 mL | INTRAMUSCULAR | Status: DC | PRN
  Administered 2012-02-18: 10 mL
  Filled 2012-02-18: qty 10

## 2012-02-18 MED ORDER — HEPARIN SOD (PORK) LOCK FLUSH 100 UNIT/ML IV SOLN
500.0000 [IU] | Freq: Once | INTRAVENOUS | Status: AC | PRN
  Administered 2012-02-18: 500 [IU]
  Filled 2012-02-18: qty 5

## 2012-02-18 MED ORDER — DEXAMETHASONE SODIUM PHOSPHATE 10 MG/ML IJ SOLN
20.0000 mg | Freq: Once | INTRAMUSCULAR | Status: AC
  Administered 2012-02-18: 20 mg via INTRAVENOUS

## 2012-02-18 MED ORDER — OMEPRAZOLE 20 MG PO CPDR: 20.0000 mg | DELAYED_RELEASE_CAPSULE | Freq: Every day | ORAL | Status: DC

## 2012-02-18 MED ORDER — SODIUM CHLORIDE 0.9 % IV SOLN
475.5000 mg | Freq: Once | INTRAVENOUS | Status: AC
  Administered 2012-02-18: 480 mg via INTRAVENOUS
  Filled 2012-02-18: qty 48

## 2012-02-18 MED ORDER — SODIUM CHLORIDE 0.9 % IV SOLN
Freq: Once | INTRAVENOUS | Status: AC
  Administered 2012-02-18: 14:00:00 via INTRAVENOUS

## 2012-02-18 MED ORDER — MAGIC MOUTHWASH: 5.0000 mL | Freq: Four times a day (QID) | ORAL | Status: DC | PRN

## 2012-02-18 MED ORDER — DIPHENHYDRAMINE HCL 50 MG/ML IJ SOLN
50.0000 mg | Freq: Once | INTRAMUSCULAR | Status: AC
  Administered 2012-02-18: 50 mg via INTRAVENOUS

## 2012-02-18 NOTE — Patient Instructions (Signed)
[facility] Discharge Instructions for Patients Receiving Chemotherapy  Today you received the following chemotherapy agents Taxol and Carboplatin  To help prevent nausea and vomiting after your treatment, we encourage you to take your nausea medication as prescribed.   If you develop nausea and vomiting that is not controlled by your nausea medication, call the clinic. If it is after clinic hours your family physician or the after hours number for the clinic or go to the Emergency Department.   BELOW ARE SYMPTOMS THAT SHOULD BE REPORTED IMMEDIATELY:  *FEVER GREATER THAN 100.5 F  *CHILLS WITH OR WITHOUT FEVER  NAUSEA AND VOMITING THAT IS NOT CONTROLLED WITH YOUR NAUSEA MEDICATION  *UNUSUAL SHORTNESS OF BREATH  *UNUSUAL BRUISING OR BLEEDING  TENDERNESS IN MOUTH AND THROAT WITH OR WITHOUT PRESENCE OF ULCERS  *URINARY PROBLEMS  *BOWEL PROBLEMS  UNUSUAL RASH Items with * indicate a potential emergency and should be followed up as soon as possible.  Feel free to call the clinic you have any questions or concerns. The clinic phone number is [phone].   I have been informed and understand all the instructions given to me. I know to contact the clinic, my physician, or go to the Emergency Department if any problems should occur. I do not have any questions at this time, but understand that I may call the clinic during office hours   should I have any questions or need assistance in obtaining follow up care.    

## 2012-02-18 NOTE — Telephone Encounter (Signed)
Per 1/3 pof f/u as scheduled. Pt given appt schedule for January along w/prep for ct and informed that central will call her re ct appt.

## 2012-02-18 NOTE — Progress Notes (Signed)
OFFICE PROGRESS NOTE  CC  KNAPP,[name], MD [address] [place] [address] Dr. Emelia Loron Dr. 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  PRIOR THERAPY: #1. S/P left breast lumpectomy in March 2011 after she had a screen detected 1.0 cm ER+, PR+, intermediate grade DCIS. Patient had 3 sentinel biopsied all of them were negative for metastatic disease. Patient underwent radiation therapy between 06/05/2009 through 07/02/2009. She was then begun on tamoxifen 20 mg daily in August 2011.  #2 new diagnosis of gynecologic malignancy presenting with abdominal mass and pleural effusions. Patient was recently hospitalized with shortness of breath she was discovered to have malignant pleural effusion she is status post Pleurx catheter is. She also had malignant ascites she's had several paracentesis procedures performed during her hospitalization. During her hospitalization she was seen by gynecologic oncology. She also went on to receive chemotherapy consisting of Taxol and carboplatinum this is her cycle 1. Her chemotherapy will be given every 21 days with day 2 Neulasta.    CURRENT THERAPY: Cycle 3 day 1 of Taxol and carboplatinum with Neulasta support  INTERVAL HISTORY: [name] [name] 67 y.o. female returns for follow up visit.  [name] doing well.  She has mild numbness and tingling in her fingertips and toes, it doesn't impair her motor function thought.  She feels like her abd is more distended and is concerned her tumors may be growing.  She also has occasional aches in her ankles at night. Otherwise she is doing well.  She has a dentist appt on 03/06/12.  Otherwise she is feeling well and w/o questions/concerns/complaints.    MEDICAL HISTORY: Past Medical History  Diagnosis Date  . Interstitial cystitis     on chronic antibiotics  . Hyperlipidemia   . Frequent UTI     on prophylaxis  . Allergy to  yellow jackets   . CAD (coronary artery disease)     a. s/p CABG 7/13;   b.  LHC 12/01/11:  pLAD 70%, mLAD 40%, CFX 40-50% prior to takeoff of the OM2, oRCA occluded, mid vessel filled via R->R collaterals and distal vessel filled by L->R collaterals, S-OM1/OM2 (small and diffusely dz) with mid 90% stenosis, 90% at OM1 anastomotic site, continuation of OM2 occluded, S-Dx patent, S-PDA occluded, L-LAD ok, EF 55-65%  => Med Rx rec.  [name] Hx of echocardiogram     Echo 5/13: EF 60-65%, grade 2 diastolic dysfunction  . Hypercholesterolemia   . Pleural effusion 12/28/11  . GERD (gastroesophageal reflux disease)   . Arthritis     "just a little; lower back" (12/28/11)  . Gout attack 08/2011    related to "stress post OHS"  . Depression   . Breast cancer     "left"; on Tamoxifen  . S/P thoracentesis 12/28/11    "for pleural effusion" (12/28/2011)  . Ovarian cancer     ALLERGIES:  is allergic to codeine; isosorbide; other; phenothiazines; talwin; and yellow jacket venom.  MEDICATIONS:  Current Outpatient Prescriptions  Medication Sig Dispense Refill  . ALPRAZolam (XANAX) 0.25 MG tablet Take 1 tablet (0.25 mg total) by mouth every 8 (eight) hours as needed for anxiety.  30 tablet  0  . Alum & Mag Hydroxide-Simeth (MAGIC MOUTHWASH) SOLN Take 5 mLs by mouth 4 (four) times daily as needed.  240 mL  6  . aspirin 325 MG tablet Take 325 mg by mouth daily.      [name]  atorvastatin (LIPITOR) 20 MG tablet Take 20 mg by mouth daily.      . carvedilol (COREG) 25 MG tablet Take 25 mg by mouth 2 (two) times daily.      [name] dexamethasone (DECADRON) 4 MG tablet Take 2 tablets (8 mg total) by mouth 2 (two) times daily with a meal. Take two times a day starting the day after chemotherapy for 3 days.  30 tablet  1  . dexamethasone (DECADRON) 4 MG tablet Take 1 by mouth daily  30 tablet  6  . Docusate Calcium (STOOL SOFTENER PO) Take 2 capsules by mouth every morning.      [name] EPINEPHrine (EPIPEN 2-PAK) 0.3 mg/0.3 mL DEVI  Inject 0.3 mg into the muscle.       [name] HYDROcodone-acetaminophen (NORCO/VICODIN) 5-325 MG per tablet Take 1-2 tablets by mouth every 4 (four) hours as needed.  30 tablet  0  . LORazepam (ATIVAN) 0.5 MG tablet Take 1 tablet (0.5 mg total) by mouth every 6 (six) hours as needed (Nausea or vomiting).  30 tablet  0  . nitroGLYCERIN (NITROSTAT) 0.4 MG SL tablet Place 0.4 mg under the tongue every 5 (five) minutes as needed. Chest pain      . nystatin (MYCOSTATIN) 100000 UNIT/ML suspension Take 5 mLs (500,000 Units total) by mouth 4 (four) times daily.  60 mL  6  . ondansetron (ZOFRAN) 8 MG tablet Take 1 tablet (8 mg total) by mouth 2 (two) times daily. Take two times a day starting the day after chemo for 3 days. Then take two times a day as needed for nausea or vomiting.  30 tablet  1  . oxybutynin (DITROPAN-XL) 10 MG 24 hr tablet Take 10 mg by mouth daily.      Bertram Gala Glycol-Propyl Glycol 0.4-0.3 % SOLN Place 1 drop into both eyes every morning.       . trimethoprim (TRIMPEX) 100 MG tablet Take 100 mg by mouth daily.      . megestrol (MEGACE ES) 625 MG/5ML suspension Take 5 mLs (625 mg total) by mouth daily.  150 mL  0  . omeprazole (PRILOSEC) 20 MG capsule Take 1 capsule (20 mg total) by mouth daily.  30 capsule  6  . zolpidem (AMBIEN) 10 MG tablet Take 1 tablet (10 mg total) by mouth at bedtime as needed for sleep.  30 tablet  0   No current facility-administered medications for this visit.   Facility-Administered Medications Ordered in Other Visits  Medication Dose Route Frequency Provider Last Rate Last Dose  . CARBOplatin (PARAPLATIN) 480 mg in sodium chloride 0.9 % 250 mL chemo infusion  480 mg Intravenous Once Victorino December, MD      . heparin lock flush 100 unit/mL  500 Units Intracatheter Once PRN Victorino December, MD      . influenza  inactive virus vaccine (FLUZONE/FLUARIX) injection 0.5 mL  0.5 mL Intramuscular Once Joselyn Arrow, MD      . PACLitaxel (TAXOL) 288 mg in dextrose 5 % 250 mL  chemo infusion (> 80mg /m2)  175 mg/m2 (Treatment Plan Actual) Intravenous Once Victorino December, MD 99 mL/hr at 02/18/12 1450 288 mg at 02/18/12 1450  . sodium chloride 0.9 % injection 10 mL  10 mL Intracatheter PRN Victorino December, MD        SURGICAL HISTORY:  Past Surgical History  Procedure Date  . Breast lumpectomy 04/2009    left  . Cesarean section 1973; 1976  . Muscle release  1960    L neck and chest.; "when I was 13; pneumonia settled in my left neck"  . Coronary artery bypass graft 08/18/2011    Procedure: CORONARY ARTERY BYPASS GRAFTING (CABG);  Surgeon: Alleen Borne, MD;  Location: [facility] OR;  Service: Open Heart Surgery;  Laterality: N/A;  Coronary Artery Bypass Graft times five utilizing the left internal mammary artery and the left greater saphenous vein harvested endoscopically.  . Abdominal hysterectomy 1976  . Appendectomy 1976  . Portacath placement 01/06/2012    Procedure: INSERTION PORT-A-CATH;  Surgeon: Alleen Borne, MD;  Location: [facility] OR;  Service: Thoracic;  Laterality: Left;  . Chest tube insertion 01/06/2012    Procedure: INSERTION PLEURAL DRAINAGE CATHETER;  Surgeon: Alleen Borne, MD;  Location: [facility] OR;  Service: Thoracic;  Laterality: Bilateral;    REVIEW OF SYSTEMS:   General: fatigue (+), night sweats (-), fever (-), pain (-) Lymph: palpable nodes (-) HEENT: vision changes (-), mucositis (-), gum bleeding (-), epistaxis (-) Cardiovascular: chest pain (-), palpitations (-) Pulmonary: shortness of breath (-), dyspnea on exertion (-), cough (-), hemoptysis (-) GI:  Early satiety (-), melena (-), dysphagia (-), nausea/vomiting (-), diarrhea (-) GU: dysuria (-), hematuria (-), incontinence (-) Musculoskeletal: joint swelling (-), joint pain (-), back pain (-) Neuro: weakness (-), numbness (-), headache (-), confusion (-) Skin: Rash (-), lesions (-), dryness (-) Psych: depression (-), suicidal/homicidal ideation (-), feeling of hopelessness (-)   PHYSICAL  EXAMINATION:  BP 125/74  Pulse 88  Temp 98.2 F (36.8 C)  Resp 20  Ht 5\' 6"  (1.676 m)  Wt 138 lb 11.2 oz (62.914 kg)  BMI 22.39 kg/m2 General: Patient is an ill appearing female in no acute distress HEENT: PERRLA, sclerae anicteric no conjunctival pallor, MMM Neck: supple, no palpable adenopathy Lungs: clear to auscultation bilaterally, no wheezes, rhonchi, or rales Cardiovascular: regular rate rhythm, S1, S2, no murmurs, rubs or gallops Abdomen: Soft, non-tender, non-distended, normoactive bowel sounds, no HSM, pleurx dressings clean dry and intact Extremities: warm and well perfused, no clubbing, cyanosis, or edema Skin: No rashes or lesions Neuro: Non-focal Bilateral Breast Exam: no masses or nipple discharge, no skin changes, left breast has a well healaed scar, no nodularity or masses. ECOG PERFORMANCE STATUS: 0 - Asymptomatic  LABORATORY DATA: Lab Results  Component Value Date   WBC 15.6* 02/18/2012   HGB 10.9* 02/18/2012   HCT 34.5* 02/18/2012   MCV 88.0 02/18/2012   PLT 271 02/18/2012      Chemistry      Component Value Date/Time   NA 138 02/18/2012 1216   NA 131* 01/07/2012 1746   NA 141 12/17/2011 1144   K 4.4 02/18/2012 1216   K 4.3 01/07/2012 1746   CL 106 02/18/2012 1216   CL 96 01/07/2012 1746   CO2 24 02/18/2012 1216   CO2 27 01/07/2012 1746   BUN 25.0 02/18/2012 1216   BUN 19 01/07/2012 1746   BUN 15 12/17/2011 1144   CREATININE 0.6 02/18/2012 1216   CREATININE 0.61 01/07/2012 1746      Component Value Date/Time   CALCIUM 8.8 02/18/2012 1216   CALCIUM 8.1* 01/07/2012 1746   ALKPHOS 64 02/18/2012 1216   ALKPHOS 64 01/07/2012 1746   AST 19 02/18/2012 1216   AST 16 01/07/2012 1746   ALT 24 02/18/2012 1216   ALT 11 01/07/2012 1746   BILITOT 0.24 02/18/2012 1216   BILITOT 0.1* 01/07/2012 1746       RADIOGRAPHIC STUDIES:  No  results found.  ASSESSMENT: 67 year old female with:  #1 DCIS of the breast. She is on tamoxifen 20 mg daily. However I have recommended that we  discontinue the tamoxifen for now a do to her recent diagnosis of ovarian cancer.  #2 recent diagnosis of gynecologic malignancy patient is status post cycle 1 of Taxol and carboplatinum given during her recent hospitalization. Overall she tolerated it well.  #3 anemia  #4  Severe malnutrition   #5 oral dental hygiene/thrush  PLAN:  #1 Doing well.  Proceed with chemotherapy.    #2 Her anemia is much improved.  Her hemoglobin is normal today.    #3 She is eating very well and has stopped the Megace.    #4 She will continue using the magic mouthwash.  I have ordered her a larger volume bottle.   #5 she will return on 02/25/12 for labs.    All questions were answered. The patient knows to call the clinic with any problems, questions or concerns. We can certainly see the patient much sooner if necessary.  I spent 25 minutes counseling the patient face to face. The total time spent in the appointment was 30 minutes.  This case was reviewed with Dr. Welton Flakes.   Cherie Ouch Lyn Hollingshead, NP Medical Oncology [facility] Phone: [phone]  02/18/2012, 3:22 PM

## 2012-02-18 NOTE — Patient Instructions (Addendum)
Doing well.  Proceed with chemotherapy.  We will set up your CT scans.  Take one super b complex daily.  I will see you back on 02/25/12 for your next appt.  Please call us if you have any questions or concerns.

## 2012-02-19 ENCOUNTER — Ambulatory Visit (HOSPITAL_BASED_OUTPATIENT_CLINIC_OR_DEPARTMENT_OTHER): Payer: BC Managed Care – PPO

## 2012-02-19 VITALS — BP 128/73 | HR 93 | Temp 97.8°F | Resp 20

## 2012-02-19 DIAGNOSIS — I10 Essential (primary) hypertension: Secondary | ICD-10-CM

## 2012-02-19 DIAGNOSIS — C786 Secondary malignant neoplasm of retroperitoneum and peritoneum: Secondary | ICD-10-CM

## 2012-02-19 DIAGNOSIS — E7800 Pure hypercholesterolemia, unspecified: Secondary | ICD-10-CM

## 2012-02-19 DIAGNOSIS — I200 Unstable angina: Secondary | ICD-10-CM

## 2012-02-19 DIAGNOSIS — C50919 Malignant neoplasm of unspecified site of unspecified female breast: Secondary | ICD-10-CM

## 2012-02-19 DIAGNOSIS — R229 Localized swelling, mass and lump, unspecified: Secondary | ICD-10-CM

## 2012-02-19 DIAGNOSIS — I2510 Atherosclerotic heart disease of native coronary artery without angina pectoris: Secondary | ICD-10-CM

## 2012-02-19 DIAGNOSIS — R000 Tachycardia, unspecified: Secondary | ICD-10-CM

## 2012-02-19 DIAGNOSIS — J90 Pleural effusion, not elsewhere classified: Secondary | ICD-10-CM

## 2012-02-19 DIAGNOSIS — Z79810 Long term (current) use of selective estrogen receptor modulators (SERMs): Secondary | ICD-10-CM

## 2012-02-19 DIAGNOSIS — R5383 Other fatigue: Secondary | ICD-10-CM

## 2012-02-19 DIAGNOSIS — I208 Other forms of angina pectoris: Secondary | ICD-10-CM

## 2012-02-19 DIAGNOSIS — C801 Malignant (primary) neoplasm, unspecified: Secondary | ICD-10-CM

## 2012-02-19 DIAGNOSIS — Z5189 Encounter for other specified aftercare: Secondary | ICD-10-CM

## 2012-02-19 DIAGNOSIS — R188 Other ascites: Secondary | ICD-10-CM

## 2012-02-19 DIAGNOSIS — R55 Syncope and collapse: Secondary | ICD-10-CM

## 2012-02-19 DIAGNOSIS — I2089 Other forms of angina pectoris: Secondary | ICD-10-CM

## 2012-02-19 DIAGNOSIS — C569 Malignant neoplasm of unspecified ovary: Secondary | ICD-10-CM

## 2012-02-19 DIAGNOSIS — R0600 Dyspnea, unspecified: Secondary | ICD-10-CM

## 2012-02-19 MED ORDER — PEGFILGRASTIM INJECTION 6 MG/0.6ML
6.0000 mg | Freq: Once | SUBCUTANEOUS | Status: AC
  Administered 2012-02-19: 6 mg via SUBCUTANEOUS

## 2012-02-22 ENCOUNTER — Telehealth: Payer: Self-pay | Admitting: *Deleted

## 2012-02-22 NOTE — Telephone Encounter (Signed)
Asking if OK for patient to try OTC Tylenol for her body aches from Taxol and Neulasta. Prefers not to take the narcotic--sedates her too much. No allergy issue and liver functions are good. Gave OK to take Tylenol prn for pain.

## 2012-02-23 ENCOUNTER — Encounter (HOSPITAL_COMMUNITY): Payer: Self-pay

## 2012-02-23 ENCOUNTER — Ambulatory Visit (HOSPITAL_COMMUNITY)
Admission: RE | Admit: 2012-02-23 | Discharge: 2012-02-23 | Disposition: A | Discharge status: Home / Self Care | Payer: BC Managed Care – PPO | Source: Ambulatory Visit | Attending: Adult Health | Admitting: Adult Health

## 2012-02-23 DIAGNOSIS — I2510 Atherosclerotic heart disease of native coronary artery without angina pectoris: Secondary | ICD-10-CM | POA: Insufficient documentation

## 2012-02-23 DIAGNOSIS — Z951 Presence of aortocoronary bypass graft: Secondary | ICD-10-CM | POA: Insufficient documentation

## 2012-02-23 DIAGNOSIS — Z79899 Other long term (current) drug therapy: Secondary | ICD-10-CM | POA: Insufficient documentation

## 2012-02-23 DIAGNOSIS — Z853 Personal history of malignant neoplasm of breast: Secondary | ICD-10-CM | POA: Insufficient documentation

## 2012-02-23 DIAGNOSIS — C569 Malignant neoplasm of unspecified ovary: Secondary | ICD-10-CM | POA: Insufficient documentation

## 2012-02-23 DIAGNOSIS — Z923 Personal history of irradiation: Secondary | ICD-10-CM | POA: Insufficient documentation

## 2012-02-23 DIAGNOSIS — Z90710 Acquired absence of both cervix and uterus: Secondary | ICD-10-CM | POA: Insufficient documentation

## 2012-02-23 DIAGNOSIS — C786 Secondary malignant neoplasm of retroperitoneum and peritoneum: Secondary | ICD-10-CM | POA: Insufficient documentation

## 2012-02-23 MED ORDER — IOHEXOL 300 MG/ML  SOLN
100.0000 mL | Freq: Once | INTRAMUSCULAR | Status: AC | PRN
  Administered 2012-02-23: 100 mL via INTRAVENOUS

## 2012-02-25 ENCOUNTER — Encounter: Payer: Self-pay | Admitting: Adult Health

## 2012-02-25 ENCOUNTER — Ambulatory Visit (HOSPITAL_BASED_OUTPATIENT_CLINIC_OR_DEPARTMENT_OTHER): Payer: BC Managed Care – PPO | Admitting: Adult Health

## 2012-02-25 ENCOUNTER — Other Ambulatory Visit (HOSPITAL_BASED_OUTPATIENT_CLINIC_OR_DEPARTMENT_OTHER): Payer: BC Managed Care – PPO | Admitting: Lab

## 2012-02-25 VITALS — BP 115/70 | HR 86 | Temp 97.9°F | Resp 20 | Ht 66.0 in | Wt 138.8 lb

## 2012-02-25 DIAGNOSIS — C786 Secondary malignant neoplasm of retroperitoneum and peritoneum: Secondary | ICD-10-CM

## 2012-02-25 DIAGNOSIS — D0590 Unspecified type of carcinoma in situ of unspecified breast: Secondary | ICD-10-CM

## 2012-02-25 DIAGNOSIS — C569 Malignant neoplasm of unspecified ovary: Secondary | ICD-10-CM

## 2012-02-25 DIAGNOSIS — B370 Candidal stomatitis: Secondary | ICD-10-CM

## 2012-02-25 DIAGNOSIS — G589 Mononeuropathy, unspecified: Secondary | ICD-10-CM

## 2012-02-25 LAB — CBC WITH DIFFERENTIAL/PLATELET
BASO%: 0.4 % (ref 0.0–2.0)
Eosinophils Absolute: 0 10*3/uL (ref 0.0–0.5)
HCT: 30.8 % — ABNORMAL LOW (ref 34.8–46.6)
MCHC: 33 g/dL (ref 31.5–36.0)
MONO#: 0.4 10*3/uL (ref 0.1–0.9)
NEUT#: 8.7 10*3/uL — ABNORMAL HIGH (ref 1.5–6.5)
NEUT%: 83.8 % — ABNORMAL HIGH (ref 38.4–76.8)
WBC: 10.4 10*3/uL — ABNORMAL HIGH (ref 3.9–10.3)
lymph#: 1.2 10*3/uL (ref 0.9–3.3)

## 2012-02-25 LAB — COMPREHENSIVE METABOLIC PANEL (CC13)
ALT: 26 U/L (ref 0–55)
CO2: 26 mEq/L (ref 22–29)
Calcium: 8.3 mg/dL — ABNORMAL LOW (ref 8.4–10.4)
Chloride: 100 mEq/L (ref 98–107)
Glucose: 135 mg/dl — ABNORMAL HIGH (ref 70–99)
Sodium: 137 mEq/L (ref 136–145)
Total Protein: 5.8 g/dL — ABNORMAL LOW (ref 6.4–8.3)

## 2012-02-25 NOTE — Patient Instructions (Signed)
Doing well.  We will see you back on 1/24 for your next cycle of chemotherapy.  Please call us if you have any questions or concerns.

## 2012-02-25 NOTE — Progress Notes (Signed)
OFFICE PROGRESS NOTE  CC  KNAPP,[name], MD [address] [place] [address] Dr. Emelia Loron Dr. 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  PRIOR THERAPY: #1. S/P left breast lumpectomy in March 2011 after she had a screen detected 1.0 cm ER+, PR+, intermediate grade DCIS. Patient had 3 sentinel biopsied all of them were negative for metastatic disease. Patient underwent radiation therapy between 06/05/2009 through 07/02/2009. She was then begun on tamoxifen 20 mg daily in August 2011.  #2 new diagnosis of gynecologic malignancy presenting with abdominal mass and pleural effusions. Patient was recently hospitalized with shortness of breath she was discovered to have malignant pleural effusion she is status post Pleurx catheter is. She also had malignant ascites she's had several paracentesis procedures performed during her hospitalization. During her hospitalization she was seen by gynecologic oncology. She also went on to receive chemotherapy consisting of Taxol and carboplatinum this is her cycle 1. Her chemotherapy will be given every 21 days with day 2 Neulasta.    CURRENT THERAPY: Cycle 3 day 8 of Taxol and carboplatinum with Neulasta support  INTERVAL HISTORY: Yisroel Ramming 67 y.o. female returns for follow up visit.  [name] doing well.  Her numbness is about the same as last week.  She got Super B complex as I had suggested, and she's tolerating that well.  Otherwise, she's fatigued, but feeling rather well.  She has no fevers, chills, nausea, vomiting, or any other concerns.    MEDICAL HISTORY: Past Medical History  Diagnosis Date  . Interstitial cystitis     on chronic antibiotics  . Hyperlipidemia   . Frequent UTI     on prophylaxis  . 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 CAD (coronary artery disease)     a. s/p CABG 7/13;   b.  LHC 12/01/11:  pLAD 70%, mLAD 40%, CFX 40-50% prior to takeoff  of the OM2, oRCA occluded, mid vessel filled via R->R collaterals and distal vessel filled by L->R collaterals, S-OM1/OM2 (small and diffusely dz) with mid 90% stenosis, 90% at OM1 anastomotic site, continuation of OM2 occluded, S-Dx patent, S-PDA occluded, L-LAD ok, EF 55-65%  => Med Rx rec.  [name] Hx of echocardiogram     Echo 5/13: EF 60-65%, grade 2 diastolic dysfunction  . Hypercholesterolemia   . Pleural effusion 12/28/11  . GERD (gastroesophageal reflux disease)   . Arthritis     "just a little; lower back" (12/28/11)  . Gout attack 08/2011    related to "stress post OHS"  . Depression   . Breast cancer     "left"; on Tamoxifen  . S/P thoracentesis 12/28/11    "for pleural effusion" (12/28/2011)  . Ovarian cancer     ALLERGIES:  is allergic to codeine; isosorbide; other; phenothiazines; talwin; and yellow jacket venom.  MEDICATIONS:  Current Outpatient Prescriptions  Medication Sig Dispense Refill  . ALPRAZolam (XANAX) 0.25 MG tablet Take 1 tablet (0.25 mg total) by mouth every 8 (eight) hours as needed for anxiety.  30 tablet  0  . Alum & Mag Hydroxide-Simeth (MAGIC MOUTHWASH) SOLN Take 5 mLs by mouth 4 (four) times daily as needed.  240 mL  6  . aspirin 325 MG tablet Take 325 mg by mouth daily.      [name] atorvastatin (LIPITOR) 20 MG tablet Take 20 mg by mouth daily.      . B Complex-C (SUPER B COMPLEX PO) Take  1 tablet by mouth daily.      . carvedilol (COREG) 25 MG tablet Take 25 mg by mouth 2 (two) times daily.      [name] dexamethasone (DECADRON) 4 MG tablet Take 2 tablets (8 mg total) by mouth 2 (two) times daily with a meal. Take two times a day starting the day after chemotherapy for 3 days.  30 tablet  1  . dexamethasone (DECADRON) 4 MG tablet Take 1 by mouth daily  30 tablet  6  . Docusate Calcium (STOOL SOFTENER PO) Take 2 capsules by mouth every morning.      [name] EPINEPHrine (EPIPEN 2-PAK) 0.3 mg/0.3 mL DEVI Inject 0.3 mg into the muscle.       [name] HYDROcodone-acetaminophen  (NORCO/VICODIN) 5-325 MG per tablet Take 1-2 tablets by mouth every 4 (four) hours as needed.  30 tablet  0  . LORazepam (ATIVAN) 0.5 MG tablet Take 1 tablet (0.5 mg total) by mouth every 6 (six) hours as needed (Nausea or vomiting).  30 tablet  0  . nitroGLYCERIN (NITROSTAT) 0.4 MG SL tablet Place 0.4 mg under the tongue every 5 (five) minutes as needed. Chest pain      . nystatin (MYCOSTATIN) 100000 UNIT/ML suspension Take 5 mLs (500,000 Units total) by mouth 4 (four) times daily.  60 mL  6  . omeprazole (PRILOSEC) 20 MG capsule Take 1 capsule (20 mg total) by mouth daily.  30 capsule  6  . ondansetron (ZOFRAN) 8 MG tablet Take 1 tablet (8 mg total) by mouth 2 (two) times daily. Take two times a day starting the day after chemo for 3 days. Then take two times a day as needed for nausea or vomiting.  30 tablet  1  . oxybutynin (DITROPAN-XL) 10 MG 24 hr tablet Take 10 mg by mouth daily.      Bertram Gala Glycol-Propyl Glycol 0.4-0.3 % SOLN Place 1 drop into both eyes every morning.       . trimethoprim (TRIMPEX) 100 MG tablet Take 100 mg by mouth daily.      . megestrol (MEGACE ES) 625 MG/5ML suspension Take 5 mLs (625 mg total) by mouth daily.  150 mL  0  . zolpidem (AMBIEN) 10 MG tablet Take 1 tablet (10 mg total) by mouth at bedtime as needed for sleep.  30 tablet  0   No current facility-administered medications for this visit.   Facility-Administered Medications Ordered in Other Visits  Medication Dose Route Frequency Provider Last Rate Last Dose  . influenza  inactive virus vaccine (FLUZONE/FLUARIX) injection 0.5 mL  0.5 mL Intramuscular Once Joselyn Arrow, MD        SURGICAL HISTORY:  Past Surgical History  Procedure Date  . Breast lumpectomy 04/2009    left  . Cesarean section 1973; 1976  . Muscle release 1960    L neck and chest.; "when I was 13; pneumonia settled in my left neck"  . Coronary artery bypass graft 08/18/2011    Procedure: CORONARY ARTERY BYPASS GRAFTING (CABG);  Surgeon:  Alleen Borne, MD;  Location: [facility] OR;  Service: Open Heart Surgery;  Laterality: N/A;  Coronary Artery Bypass Graft times five utilizing the left internal mammary artery and the left greater saphenous vein harvested endoscopically.  . Abdominal hysterectomy 1976  . Appendectomy 1976  . Portacath placement 01/06/2012    Procedure: INSERTION PORT-A-CATH;  Surgeon: Alleen Borne, MD;  Location: [facility] OR;  Service: Thoracic;  Laterality: Left;  . Chest tube insertion 01/06/2012  Procedure: INSERTION PLEURAL DRAINAGE CATHETER;  Surgeon: Alleen Borne, MD;  Location: [facility] OR;  Service: Thoracic;  Laterality: Bilateral;    REVIEW OF SYSTEMS:   General: fatigue (+), night sweats (-), fever (-), pain (-) Lymph: palpable nodes (-) HEENT: vision changes (-), mucositis (-), gum bleeding (-), epistaxis (-) Cardiovascular: chest pain (-), palpitations (-) Pulmonary: shortness of breath (-), dyspnea on exertion (-), cough (-), hemoptysis (-) GI:  Early satiety (-), melena (-), dysphagia (-), nausea/vomiting (-), diarrhea (-) GU: dysuria (-), hematuria (-), incontinence (-) Musculoskeletal: joint swelling (-), joint pain (-), back pain (-) Neuro: weakness (-), numbness (+), headache (-), confusion (-) Skin: Rash (-), lesions (-), dryness (-) Psych: depression (-), suicidal/homicidal ideation (-), feeling of hopelessness (-)   PHYSICAL EXAMINATION:  BP 115/70  Pulse 86  Temp 97.9 F (36.6 C)  Resp 20  Ht 5\' 6"  (1.676 m)  Wt 138 lb 12.8 oz (62.959 kg)  BMI 22.40 kg/m2 General: Patient is an ill appearing female in no acute distress HEENT: PERRLA, sclerae anicteric no conjunctival pallor, MMM Neck: supple, no palpable adenopathy Lungs: clear to auscultation bilaterally, no wheezes, rhonchi, or rales Cardiovascular: regular rate rhythm, S1, S2, no murmurs, rubs or gallops Abdomen: Soft, non-tender, non-distended, normoactive bowel sounds, no HSM, pleurx dressings clean dry and intact Extremities:  warm and well perfused, no clubbing, cyanosis, or edema Skin: No rashes or lesions Neuro: Non-focal Bilateral Breast Exam: no masses or nipple discharge, no skin changes, left breast has a well healaed scar, no nodularity or masses. ECOG PERFORMANCE STATUS: 0 - Asymptomatic  LABORATORY DATA: Lab Results  Component Value Date   WBC 10.4* 02/25/2012   HGB 10.2* 02/25/2012   HCT 30.8* 02/25/2012   MCV 87.1 02/25/2012   PLT 255 02/25/2012      Chemistry      Component Value Date/Time   NA 138 02/18/2012 1216   NA 131* 01/07/2012 1746   NA 141 12/17/2011 1144   K 4.4 02/18/2012 1216   K 4.3 01/07/2012 1746   CL 106 02/18/2012 1216   CL 96 01/07/2012 1746   CO2 24 02/18/2012 1216   CO2 27 01/07/2012 1746   BUN 25.0 02/18/2012 1216   BUN 19 01/07/2012 1746   BUN 15 12/17/2011 1144   CREATININE 0.6 02/18/2012 1216   CREATININE 0.61 01/07/2012 1746      Component Value Date/Time   CALCIUM 8.8 02/18/2012 1216   CALCIUM 8.1* 01/07/2012 1746   ALKPHOS 64 02/18/2012 1216   ALKPHOS 64 01/07/2012 1746   AST 19 02/18/2012 1216   AST 16 01/07/2012 1746   ALT 24 02/18/2012 1216   ALT 11 01/07/2012 1746   BILITOT 0.24 02/18/2012 1216   BILITOT 0.1* 01/07/2012 1746       RADIOGRAPHIC STUDIES:  No results found.  ASSESSMENT: 67 year old female with:  #1 DCIS of the breast. She is on tamoxifen 20 mg daily. However I have recommended that we discontinue the tamoxifen for now a do to her recent diagnosis of ovarian cancer.  #2 recent diagnosis of gynecologic malignancy patient is status post cycle 1 of Taxol and carboplatinum given during her recent hospitalization. Overall she tolerated it well.  #3 oral dental hygiene/thrush  #4 Neuropathy  PLAN:  #1 Ms Moritz is doing well after chemotherapy.  She does not need IV fluids today.  She had an interval CT scan on 1/8 and it revealed a mixed response to chemotherapy.  She had no disease  in the chest, minimal bilateral pleural effusions.  Her right  ovarian mass was mildly increased from 6x1x3.9 cm to 7.1x4.5cm, peritoneal carcinomatosis was stable to mildly improved, prior abdominopelvic ascites was near completely resolved, and suspective bilateral inguinal nodal metastases was mildly increased. Dr. Welton Flakes discussed these results with her, and we will continue chemotherapy for a total of 6 cycles.    #2 She will continue using the magic mouthwash and nystatin for her thrush.  I have ordered her a larger volume bottle.   #3 She will continue the Super B complex.  Should her numbness get any worse we discussed adding Neurontin.    #4 She will return on 1/24 for labs, appt, and cycle 4 of chemotherapy.  I have cleared her along with Dr. Welton Flakes to get her teeth cleaned on 03/06/12 by Dr. Marcha Solders.  We will fax him paperwork and labs.      All questions were answered. The patient knows to call the clinic with any problems, questions or concerns. We can certainly see the patient much sooner if necessary.  I spent 25 minutes counseling the patient face to face. The total time spent in the appointment was 30 minutes.  This case was reviewed with Dr. Welton Flakes.   Cherie Ouch Lyn Hollingshead, NP Medical Oncology [facility] Phone: [phone]  02/25/2012, 10:05 AM

## 2012-02-29 ENCOUNTER — Telehealth: Payer: Self-pay | Admitting: *Deleted

## 2012-02-29 NOTE — Telephone Encounter (Signed)
NP/MD, 2/14, 2/21, 3/7, 3/14  Sent michelle email to set up treatment

## 2012-02-29 NOTE — Telephone Encounter (Signed)
Per staff message and POF I have scheduled appts.  JMW  

## 2012-03-06 ENCOUNTER — Telehealth: Payer: Self-pay | Admitting: Cardiovascular Disease

## 2012-03-06 NOTE — Telephone Encounter (Signed)
Spoke with HH nurse, pulse 104 Reg rate and rhythm,  nurse verified pt is taking meds accurately. In review of hosp dc note and later visits, pt is to be on coreg 25 mg bid but her bottle states 12.5 mg BID. She has f/u this week, told her to have pt take as prescribed - 25 mg BID, keep record and bring to app in two days, nurse agreed to plan and will call pt now to advise.

## 2012-03-06 NOTE — Telephone Encounter (Signed)
New problem:    Heart rate slowly elevated from the hospital . Range over 100. Today. After resting for an hour. Chemo on Friday.

## 2012-03-08 ENCOUNTER — Other Ambulatory Visit: Payer: Self-pay | Admitting: *Deleted

## 2012-03-08 ENCOUNTER — Ambulatory Visit (INDEPENDENT_AMBULATORY_CARE_PROVIDER_SITE_OTHER): Payer: BC Managed Care – PPO | Admitting: Nurse Practitioner

## 2012-03-08 ENCOUNTER — Encounter: Payer: Self-pay | Admitting: Nurse Practitioner

## 2012-03-08 VITALS — BP 120/62 | HR 88 | Ht 66.0 in | Wt 146.0 lb

## 2012-03-08 DIAGNOSIS — R000 Tachycardia, unspecified: Secondary | ICD-10-CM

## 2012-03-08 DIAGNOSIS — I498 Other specified cardiac arrhythmias: Secondary | ICD-10-CM

## 2012-03-08 MED ORDER — CARVEDILOL 25 MG PO TABS: 25.0000 mg | ORAL_TABLET | Freq: Two times a day (BID) | ORAL | Status: DC

## 2012-03-08 MED ORDER — LORAZEPAM 0.5 MG PO TABS: 0.5000 mg | ORAL_TABLET | Freq: Four times a day (QID) | ORAL | Status: DC | PRN

## 2012-03-08 NOTE — Progress Notes (Signed)
[name] [name] Date of Birth: Jan 17, 1946 Medical Record #[id number]  History of Present Illness: [name] [name] is seen today for a post hospital visit. She is seen for Dr. Elease Hashimoto. She has multiple medical issues which include CAD and past CABG. She is managed medically. Other issues include breast cancer back in 2011, newly diagnosed ovarian cancer, HLD and HTN. In the hospital back in November with pleural effusion and had to have bilateral pleurexes placed. Her ovarian cancer was found at that time and is associated with peritoneal carcinomatosis and ascites. She has had issues with tachycardia and hasd been severely malnourished with her acute illness.   Home health called earlier this week to verify medicines. Dose of her Coreg was incorrect. She was discharged on 25 mg BID and her bottle was 12.5 mg. She was tachycardic with heart rate of 104.   She comes in today. She is here with family. She is doing ok. She is on chemotherapy. Followed by Dr. Park Breed. Some neuropathy noted. Back walking. No chest pain. Saw her heart rate gradually creeping up but was basically asymptomatic. Denies being dizzy or lightheaded. BP has been ok. She has had no chest pain. She will have some swelling in her right foot from her prior vein harvesting. It goes down with elevation of her legs.   Current Outpatient Prescriptions on File Prior to Visit  Medication Sig Dispense Refill  . ALPRAZolam (XANAX) 0.25 MG tablet Take 1 tablet (0.25 mg total) by mouth every 8 (eight) hours as needed for anxiety.  30 tablet  0  . Alum & Mag Hydroxide-Simeth (MAGIC MOUTHWASH) SOLN Take 5 mLs by mouth 4 (four) times daily as needed.  240 mL  6  . aspirin 325 MG tablet Take 325 mg by mouth daily.      [name] atorvastatin (LIPITOR) 20 MG tablet Take 20 mg by mouth daily.      . B Complex-C (SUPER B COMPLEX PO) Take 1 tablet by mouth daily.      . carvedilol (COREG) 25 MG tablet Take 1 tablet (25 mg total) by mouth 2 (two) times  daily.  180 tablet  3  . dexamethasone (DECADRON) 4 MG tablet Take 2 tablets (8 mg total) by mouth 2 (two) times daily with a meal. Take two times a day starting the day after chemotherapy for 3 days.  30 tablet  1  . dexamethasone (DECADRON) 4 MG tablet Take 1 by mouth daily  30 tablet  6  . Docusate Calcium (STOOL SOFTENER PO) Take 2 capsules by mouth every morning.      [name] EPINEPHrine (EPIPEN 2-PAK) 0.3 mg/0.3 mL DEVI Inject 0.3 mg into the muscle.       [name] HYDROcodone-acetaminophen (NORCO/VICODIN) 5-325 MG per tablet Take 1-2 tablets by mouth every 4 (four) hours as needed.  30 tablet  0  . nitroGLYCERIN (NITROSTAT) 0.4 MG SL tablet Place 0.4 mg under the tongue every 5 (five) minutes as needed. Chest pain      . nystatin (MYCOSTATIN) 100000 UNIT/ML suspension Take 5 mLs (500,000 Units total) by mouth 4 (four) times daily.  60 mL  6  . omeprazole (PRILOSEC) 20 MG capsule Take 1 capsule (20 mg total) by mouth daily.  30 capsule  6  . ondansetron (ZOFRAN) 8 MG tablet Take 1 tablet (8 mg total) by mouth 2 (two) times daily. Take two times a day starting the day after chemo for 3 days. Then take two times a day as needed  for nausea or vomiting.  30 tablet  1  . oxybutynin (DITROPAN-XL) 10 MG 24 hr tablet Take 10 mg by mouth daily.      Bertram Gala Glycol-Propyl Glycol 0.4-0.3 % SOLN Place 1 drop into both eyes every morning.       . trimethoprim (TRIMPEX) 100 MG tablet Take 100 mg by mouth daily.       Current Facility-Administered Medications on File Prior to Visit  Medication Dose Route Frequency Provider Last Rate Last Dose  . influenza  inactive virus vaccine (FLUZONE/FLUARIX) injection 0.5 mL  0.5 mL Intramuscular Once Joselyn Arrow, MD        Allergies  Allergen Reactions  . Codeine Nausea And Vomiting       . Isosorbide Other (See Comments)    Extreme headaches  . Other Swelling    All Lip moisturizers except Vaseline.  . Phenothiazines Other (See Comments)    Makes her stop breathing.    . Talwin (Pentazocine) Nausea And Vomiting  . Yellow Jacket Venom Anaphylaxis    Past Medical History  Diagnosis Date  . Interstitial cystitis     on chronic antibiotics  . Hyperlipidemia   . Frequent UTI     on prophylaxis  . 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 CAD (coronary artery disease)     a. s/p CABG 7/13;   b.  LHC 12/01/11:  pLAD 70%, mLAD 40%, CFX 40-50% prior to takeoff of the OM2, oRCA occluded, mid vessel filled via R->R collaterals and distal vessel filled by L->R collaterals, S-OM1/OM2 (small and diffusely dz) with mid 90% stenosis, 90% at OM1 anastomotic site, continuation of OM2 occluded, S-Dx patent, S-PDA occluded, L-LAD ok, EF 55-65%  => Med Rx rec.  [name] Hx of echocardiogram     Echo 5/13: EF 60-65%, grade 2 diastolic dysfunction  . Hypercholesterolemia   . Pleural effusion 12/28/11    s/p pleurx catheter  . GERD (gastroesophageal reflux disease)   . Arthritis     "just a little; lower back" (12/28/11)  . Gout attack 08/2011    related to "stress post OHS"  . Depression   . Breast cancer     "left"; on Tamoxifen  . S/P thoracentesis 12/28/11    "for pleural effusion" (12/28/2011)  . Ovarian cancer   . Tachycardia     Past Surgical History  Procedure Date  . Breast lumpectomy 04/2009    left  . Cesarean section 1973; 1976  . Muscle release 1960    L neck and chest.; "when I was 13; pneumonia settled in my left neck"  . Coronary artery bypass graft 08/18/2011    Procedure: CORONARY ARTERY BYPASS GRAFTING (CABG);  Surgeon: Alleen Borne, MD;  Location: [facility] OR;  Service: Open Heart Surgery;  Laterality: N/A;  Coronary Artery Bypass Graft times five utilizing the left internal mammary artery and the left greater saphenous vein harvested endoscopically.  . Abdominal hysterectomy 1976  . Appendectomy 1976  . Portacath placement 01/06/2012    Procedure: INSERTION PORT-A-CATH;  Surgeon: Alleen Borne, MD;  Location: [facility] OR;  Service: Thoracic;  Laterality: Left;  .  Chest tube insertion 01/06/2012    Procedure: INSERTION PLEURAL DRAINAGE CATHETER;  Surgeon: Alleen Borne, MD;  Location: [facility] OR;  Service: Thoracic;  Laterality: Bilateral;    History  Smoking status  . Former Smoker -- 0.1 packs/day for 15 years  . Types: Cigarettes  . Quit date: 02/15/2001  Smokeless tobacco  . Never Used  History  Alcohol Use  . 4.2 oz/week  . 7 Shots of liquor per week    Comment: 12/28/11 "1 gin & tonic q hs"    Family History  Problem Relation Age of Onset  . Cancer Mother 70    breast cancer  . Heart disease Father 60    MI at 44, CABG in 82's  . Hepatitis Father     C from blood transfusion  . Heart disease Brother     CABG in 4's  . Heart disease Paternal Aunt   . Heart disease Paternal Uncle   . Heart disease Paternal Grandfather   . Diabetes Neg Hx     Review of Systems: The review of systems is per the HPI.  All other systems were reviewed and are negative.  Physical Exam: BP 120/62  Pulse 88  Ht 5\' 6"  (1.676 m)  Wt 146 lb (66.225 kg)  BMI 23.56 kg/m2 Patient is very pleasant and in no acute distress. Skin is warm and dry. Color is normal to a little sallow.  HEENT is unremarkable. She has lost her hair.  Normocephalic/atraumatic. PERRL. Sclera are nonicteric. Neck is supple. No masses. No JVD. Lungs are clear with decreased breath sounds in the bases. Cardiac exam shows a regular rate and rhythm. Pleurexes in place. Abdomen is soft. Extremities are without edema. Gait and ROM are intact. No gross neurologic deficits noted.  LABORATORY DATA: Reviewed.    Echo Study Conclusions from May 2013  - Left ventricle: The cavity size was normal. Wall thickness was normal. Systolic function was normal. The estimated ejection fraction was in the range of 60% to 65%. Wall motion was normal; there were no regional wall motion abnormalities. Features are consistent with a pseudonormal left ventricular filling pattern, with  concomitant abnormal relaxation and increased filling pressure (grade 2 diastolic dysfunction). - Aortic valve: There was no stenosis. - Mitral valve: No significant regurgitation. - Right ventricle: The cavity size was normal. Systolic function was normal. - Pulmonary arteries: No complete TR doppler jet so unable to estimate PA systolic pressure. - Inferior vena cava: The vessel was normal in size; the respirophasic diameter changes were in the normal range (= 50%); findings are consistent with normal central venous pressure.   Lab Results  Component Value Date   WBC 10.4* 02/25/2012   HGB 10.2* 02/25/2012   HCT 30.8* 02/25/2012   PLT 255 02/25/2012   GLUCOSE 135* 02/25/2012   CHOL 113 12/28/2011   TRIG 135 12/17/2011   HDL 33* 12/17/2011   LDLCALC 62 12/17/2011   ALT 26 02/25/2012   AST 18 02/25/2012   NA 137 02/25/2012   K 3.2* 02/25/2012   CL 100 02/25/2012   CREATININE 0.6 02/25/2012   BUN 19.0 02/25/2012   CO2 26 02/25/2012   TSH 2.43 07/06/2011   INR 1.13 12/28/2011   HGBA1C 5.9* 08/16/2011   Assessment / Plan: 1. Tachycardia - back on correct dose of Coreg. HR is back down. We will leave her on this dose. BP is ok.   2. Ovarian cancer - on chemotherapy. Last OV note discussing her CT showed mixed response. She has chemo on Friday.   3. CAD - prior CABG - managed medically - no symptoms at this time.   4. Edema - I have asked her to use support stockings.   I will have her see Dr. Elease Hashimoto in about 4 months. We will be available as needed.   Patient is agreeable to this  plan and will call if any problems develop in the interim.

## 2012-03-08 NOTE — Patient Instructions (Addendum)
For now, stay on your current medicines and take the 25 mg of Coreg two times a day.  I have refilled the Coreg today  See Dr. Elease Hashimoto in 4 months  Call the [facility] office at [phone] if you have any questions, problems or concerns.

## 2012-03-10 ENCOUNTER — Ambulatory Visit (HOSPITAL_BASED_OUTPATIENT_CLINIC_OR_DEPARTMENT_OTHER): Payer: Medicare Other | Admitting: Oncology

## 2012-03-10 ENCOUNTER — Ambulatory Visit (HOSPITAL_BASED_OUTPATIENT_CLINIC_OR_DEPARTMENT_OTHER): Payer: BC Managed Care – PPO

## 2012-03-10 ENCOUNTER — Telehealth: Payer: Self-pay | Admitting: Oncology

## 2012-03-10 ENCOUNTER — Other Ambulatory Visit (HOSPITAL_BASED_OUTPATIENT_CLINIC_OR_DEPARTMENT_OTHER): Payer: BC Managed Care – PPO | Admitting: Lab

## 2012-03-10 ENCOUNTER — Encounter: Payer: Self-pay | Admitting: Oncology

## 2012-03-10 VITALS — BP 116/72 | HR 87 | Temp 98.3°F | Resp 18 | Ht 66.0 in | Wt 146.6 lb

## 2012-03-10 DIAGNOSIS — C569 Malignant neoplasm of unspecified ovary: Secondary | ICD-10-CM

## 2012-03-10 DIAGNOSIS — C50919 Malignant neoplasm of unspecified site of unspecified female breast: Secondary | ICD-10-CM

## 2012-03-10 DIAGNOSIS — Z5111 Encounter for antineoplastic chemotherapy: Secondary | ICD-10-CM

## 2012-03-10 DIAGNOSIS — D0590 Unspecified type of carcinoma in situ of unspecified breast: Secondary | ICD-10-CM

## 2012-03-10 DIAGNOSIS — C786 Secondary malignant neoplasm of retroperitoneum and peritoneum: Secondary | ICD-10-CM

## 2012-03-10 DIAGNOSIS — I2510 Atherosclerotic heart disease of native coronary artery without angina pectoris: Secondary | ICD-10-CM

## 2012-03-10 DIAGNOSIS — R188 Other ascites: Secondary | ICD-10-CM

## 2012-03-10 LAB — CBC WITH DIFFERENTIAL/PLATELET
BASO%: 0.5 % (ref 0.0–2.0)
MCHC: 31.4 g/dL — ABNORMAL LOW (ref 31.5–36.0)
MONO#: 1 10*3/uL — ABNORMAL HIGH (ref 0.1–0.9)
RBC: 3.62 10*6/uL — ABNORMAL LOW (ref 3.70–5.45)
RDW: 28.1 % — ABNORMAL HIGH (ref 11.2–14.5)
WBC: 17.7 10*3/uL — ABNORMAL HIGH (ref 3.9–10.3)
lymph#: 1.5 10*3/uL (ref 0.9–3.3)
nRBC: 1 % — ABNORMAL HIGH (ref 0–0)

## 2012-03-10 LAB — COMPREHENSIVE METABOLIC PANEL (CC13)
ALT: 30 U/L (ref 0–55)
AST: 19 U/L (ref 5–34)
Albumin: 3.2 g/dL — ABNORMAL LOW (ref 3.5–5.0)
Calcium: 9.3 mg/dL (ref 8.4–10.4)
Chloride: 101 mEq/L (ref 98–107)
Creatinine: 0.7 mg/dL (ref 0.6–1.1)
Potassium: 4.3 mEq/L (ref 3.5–5.1)
Sodium: 137 mEq/L (ref 136–145)

## 2012-03-10 MED ORDER — SODIUM CHLORIDE 0.9 % IJ SOLN
10.0000 mL | INTRAMUSCULAR | Status: DC | PRN
  Administered 2012-03-10: 10 mL
  Filled 2012-03-10: qty 10

## 2012-03-10 MED ORDER — DEXAMETHASONE SODIUM PHOSPHATE 4 MG/ML IJ SOLN
20.0000 mg | Freq: Once | INTRAMUSCULAR | Status: AC
  Administered 2012-03-10: 20 mg via INTRAVENOUS

## 2012-03-10 MED ORDER — FAMOTIDINE IN NACL 20-0.9 MG/50ML-% IV SOLN
20.0000 mg | Freq: Once | INTRAVENOUS | Status: AC
  Administered 2012-03-10: 20 mg via INTRAVENOUS

## 2012-03-10 MED ORDER — LORAZEPAM 0.5 MG PO TABS: 0.5000 mg | ORAL_TABLET | Freq: Four times a day (QID) | ORAL | Status: DC | PRN

## 2012-03-10 MED ORDER — ONDANSETRON 16 MG/50ML IVPB (CHCC)
16.0000 mg | Freq: Once | INTRAVENOUS | Status: AC
  Administered 2012-03-10: 16 mg via INTRAVENOUS

## 2012-03-10 MED ORDER — SODIUM CHLORIDE 0.9 % IV SOLN
Freq: Once | INTRAVENOUS | Status: AC
  Administered 2012-03-10: 12:00:00 via INTRAVENOUS

## 2012-03-10 MED ORDER — HEPARIN SOD (PORK) LOCK FLUSH 100 UNIT/ML IV SOLN
500.0000 [IU] | Freq: Once | INTRAVENOUS | Status: AC | PRN
  Administered 2012-03-10: 500 [IU]
  Filled 2012-03-10: qty 5

## 2012-03-10 MED ORDER — SODIUM CHLORIDE 0.9 % IV SOLN
545.5000 mg | Freq: Once | INTRAVENOUS | Status: AC
  Administered 2012-03-10: 550 mg via INTRAVENOUS
  Filled 2012-03-10: qty 55

## 2012-03-10 MED ORDER — PACLITAXEL CHEMO INJECTION 300 MG/50ML
175.0000 mg/m2 | Freq: Once | INTRAVENOUS | Status: AC
  Administered 2012-03-10: 288 mg via INTRAVENOUS
  Filled 2012-03-10: qty 48

## 2012-03-10 MED ORDER — DIPHENHYDRAMINE HCL 50 MG/ML IJ SOLN
50.0000 mg | Freq: Once | INTRAMUSCULAR | Status: AC
  Administered 2012-03-10: 50 mg via INTRAVENOUS

## 2012-03-10 NOTE — Telephone Encounter (Signed)
gv and printed appt schedule for pt for Jan

## 2012-03-10 NOTE — Patient Instructions (Addendum)
[facility] Discharge Instructions for Patients Receiving Chemotherapy  Today you received the following chemotherapy agents Taxol/Carboplatin  To help prevent nausea and vomiting after your treatment, we encourage you to take your nausea medication as prescribed.   If you develop nausea and vomiting that is not controlled by your nausea medication, call the clinic. If it is after clinic hours your family physician or the after hours number for the clinic or go to the Emergency Department.   BELOW ARE SYMPTOMS THAT SHOULD BE REPORTED IMMEDIATELY:  *FEVER GREATER THAN 100.5 F  *CHILLS WITH OR WITHOUT FEVER  NAUSEA AND VOMITING THAT IS NOT CONTROLLED WITH YOUR NAUSEA MEDICATION  *UNUSUAL SHORTNESS OF BREATH  *UNUSUAL BRUISING OR BLEEDING  TENDERNESS IN MOUTH AND THROAT WITH OR WITHOUT PRESENCE OF ULCERS  *URINARY PROBLEMS  *BOWEL PROBLEMS  UNUSUAL RASH Items with * indicate a potential emergency and should be followed up as soon as possible.  Feel free to call the clinic you have any questions or concerns. The clinic phone number is [phone].   I have been informed and understand all the instructions given to me. I know to contact the clinic, my physician, or go to the Emergency Department if any problems should occur. I do not have any questions at this time, but understand that I may call the clinic during office hours   should I have any questions or need assistance in obtaining follow up care.    __________________________________________  _____________  __________ Signature of Patient or Authorized Representative            Date                   Time    __________________________________________ Nurse's Signature    

## 2012-03-10 NOTE — Progress Notes (Signed)
OFFICE PROGRESS NOTE  CC  KNAPP,[name], MD [address] [place] [address] Dr. Emelia Loron Dr. 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  PRIOR THERAPY: #1. S/P left breast lumpectomy in March 2011 after she had a screen detected 1.0 cm ER+, PR+, intermediate grade DCIS. Patient had 3 sentinel biopsied all of them were negative for metastatic disease. Patient underwent radiation therapy between 06/05/2009 through 07/02/2009. She was then begun on tamoxifen 20 mg daily in August 2011.  #2 new diagnosis of gynecologic malignancy presenting with abdominal mass and pleural effusions. Patient was recently hospitalized with shortness of breath she was discovered to have malignant pleural effusion she is status post Pleurx catheter is. She also had malignant ascites she's had several paracentesis procedures performed during her hospitalization. During her hospitalization she was seen by gynecologic oncology. She also went on to receive chemotherapy consisting of Taxol and carboplatinum this is her cycle 1. Her chemotherapy will be given every 21 days with day 2 Neulasta.  CURRENT THERAPY: Cycle 4 day 1 of Taxol and carboplatinum with Neulasta support  INTERVAL HISTORY: [name] [name] 67 y.o. female returns for follow up visit.  [name] doing well.  Her numbness is about the same as last week.  She got Super B complex as I had suggested, and she's tolerating that well.  Otherwise, she's fatigued, but feeling rather well.  She has no fevers, chills, nausea, vomiting, or any other concerns.    MEDICAL HISTORY: Past Medical History  Diagnosis Date  . Interstitial cystitis     on chronic antibiotics  . Hyperlipidemia   . Frequent UTI     on prophylaxis  . 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 CAD (coronary artery disease)     a. s/p CABG 7/13;   b.  LHC 12/01/11:  pLAD 70%, mLAD 40%, CFX 40-50% prior to takeoff of  the OM2, oRCA occluded, mid vessel filled via R->R collaterals and distal vessel filled by L->R collaterals, S-OM1/OM2 (small and diffusely dz) with mid 90% stenosis, 90% at OM1 anastomotic site, continuation of OM2 occluded, S-Dx patent, S-PDA occluded, L-LAD ok, EF 55-65%  => Med Rx rec.  [name] Hx of echocardiogram     Echo 5/13: EF 60-65%, grade 2 diastolic dysfunction  . Hypercholesterolemia   . Pleural effusion 12/28/11    s/p pleurx catheter  . GERD (gastroesophageal reflux disease)   . Arthritis     "just a little; lower back" (12/28/11)  . Gout attack 08/2011    related to "stress post OHS"  . Depression   . Breast cancer     "left"; on Tamoxifen  . S/P thoracentesis 12/28/11    "for pleural effusion" (12/28/2011)  . Ovarian cancer   . Tachycardia     ALLERGIES:  is allergic to codeine; isosorbide; other; phenothiazines; talwin; and yellow jacket venom.  MEDICATIONS:  Current Outpatient Prescriptions  Medication Sig Dispense Refill  . ALPRAZolam (XANAX) 0.25 MG tablet Take 1 tablet (0.25 mg total) by mouth every 8 (eight) hours as needed for anxiety.  30 tablet  0  . Alum & Mag Hydroxide-Simeth (MAGIC MOUTHWASH) SOLN Take 5 mLs by mouth 4 (four) times daily as needed.  240 mL  6  . aspirin 325 MG tablet Take 325 mg by mouth daily.      [name] atorvastatin (LIPITOR) 20 MG tablet Take 20 mg by mouth daily.      [name]  B Complex-C (SUPER B COMPLEX PO) Take 1 tablet by mouth daily.      . carvedilol (COREG) 25 MG tablet Take 1 tablet (25 mg total) by mouth 2 (two) times daily.  180 tablet  3  . dexamethasone (DECADRON) 4 MG tablet Take 2 tablets (8 mg total) by mouth 2 (two) times daily with a meal. Take two times a day starting the day after chemotherapy for 3 days.  30 tablet  1  . dexamethasone (DECADRON) 4 MG tablet Take 1 by mouth daily  30 tablet  6  . Docusate Calcium (STOOL SOFTENER PO) Take 2 capsules by mouth every morning.      [name] EPINEPHrine (EPIPEN 2-PAK) 0.3 mg/0.3 mL DEVI Inject  0.3 mg into the muscle.       [name] HYDROcodone-acetaminophen (NORCO/VICODIN) 5-325 MG per tablet Take 1-2 tablets by mouth every 4 (four) hours as needed.  30 tablet  0  . LORazepam (ATIVAN) 0.5 MG tablet Take 1 tablet (0.5 mg total) by mouth every 6 (six) hours as needed for anxiety (Nausea/Vomiting).  60 tablet  0  . metoprolol succinate (TOPROL-XL) 50 MG 24 hr tablet 25 mg Twice daily.      . nitroGLYCERIN (NITROSTAT) 0.4 MG SL tablet Place 0.4 mg under the tongue every 5 (five) minutes as needed. Chest pain      . nystatin (MYCOSTATIN) 100000 UNIT/ML suspension Take 5 mLs (500,000 Units total) by mouth 4 (four) times daily.  60 mL  6  . omeprazole (PRILOSEC) 20 MG capsule Take 1 capsule (20 mg total) by mouth daily.  30 capsule  6  . ondansetron (ZOFRAN) 8 MG tablet Take 1 tablet (8 mg total) by mouth 2 (two) times daily. Take two times a day starting the day after chemo for 3 days. Then take two times a day as needed for nausea or vomiting.  30 tablet  1  . oxybutynin (DITROPAN-XL) 10 MG 24 hr tablet Take 10 mg by mouth daily.      Bertram Gala Glycol-Propyl Glycol 0.4-0.3 % SOLN Place 1 drop into both eyes every morning.       . SF 5000 PLUS 1.1 % CREA dental cream 1.1 %.      . trimethoprim (TRIMPEX) 100 MG tablet Take 100 mg by mouth daily.      . prochlorperazine (COMPAZINE) 10 MG tablet as needed.      . prochlorperazine (COMPAZINE) 25 MG suppository as needed.       No current facility-administered medications for this visit.   Facility-Administered Medications Ordered in Other Visits  Medication Dose Route Frequency Provider Last Rate Last Dose  . influenza  inactive virus vaccine (FLUZONE/FLUARIX) injection 0.5 mL  0.5 mL Intramuscular Once Joselyn Arrow, MD        SURGICAL HISTORY:  Past Surgical History  Procedure Date  . Breast lumpectomy 04/2009    left  . Cesarean section 1973; 1976  . Muscle release 1960    L neck and chest.; "when I was 13; pneumonia settled in my left neck"    . Coronary artery bypass graft 08/18/2011    Procedure: CORONARY ARTERY BYPASS GRAFTING (CABG);  Surgeon: Alleen Borne, MD;  Location: [facility] OR;  Service: Open Heart Surgery;  Laterality: N/A;  Coronary Artery Bypass Graft times five utilizing the left internal mammary artery and the left greater saphenous vein harvested endoscopically.  . Abdominal hysterectomy 1976  . Appendectomy 1976  . Portacath placement 01/06/2012    Procedure:  INSERTION PORT-A-CATH;  Surgeon: Alleen Borne, MD;  Location: [facility] OR;  Service: Thoracic;  Laterality: Left;  . Chest tube insertion 01/06/2012    Procedure: INSERTION PLEURAL DRAINAGE CATHETER;  Surgeon: Alleen Borne, MD;  Location: [facility] OR;  Service: Thoracic;  Laterality: Bilateral;    REVIEW OF SYSTEMS:   General: fatigue (+), night sweats (-), fever (-), pain (-) Lymph: palpable nodes (-) HEENT: vision changes (-), mucositis (-), gum bleeding (-), epistaxis (-) Cardiovascular: chest pain (-), palpitations (-) Pulmonary: shortness of breath (-), dyspnea on exertion (-), cough (-), hemoptysis (-) GI:  Early satiety (-), melena (-), dysphagia (-), nausea/vomiting (-), diarrhea (-) GU: dysuria (-), hematuria (-), incontinence (-) Musculoskeletal: joint swelling (-), joint pain (-), back pain (-) Neuro: weakness (-), numbness (+), headache (-), confusion (-) Skin: Rash (-), lesions (-), dryness (-) Psych: depression (-), suicidal/homicidal ideation (-), feeling of hopelessness (-)   PHYSICAL EXAMINATION:  BP 116/72  Pulse 87  Temp 98.3 F (36.8 C) (Oral)  Resp 18  Ht 5\' 6"  (1.676 m)  Wt 146 lb 9.6 oz (66.497 kg)  BMI 23.66 kg/m2 General: Patient is an ill appearing female in no acute distress HEENT: PERRLA, sclerae anicteric no conjunctival pallor, MMM Neck: supple, no palpable adenopathy Lungs: clear to auscultation bilaterally, no wheezes, rhonchi, or rales Cardiovascular: regular rate rhythm, S1, S2, no murmurs, rubs or gallops Abdomen: Soft,  non-tender, non-distended, normoactive bowel sounds, no HSM, pleurx dressings clean dry and intact Extremities: warm and well perfused, no clubbing, cyanosis, or edema Skin: No rashes or lesions Neuro: Non-focal Bilateral Breast Exam: no masses or nipple discharge, no skin changes, left breast has a well healaed scar, no nodularity or masses. ECOG PERFORMANCE STATUS: 0 - Asymptomatic  LABORATORY DATA: Lab Results  Component Value Date   WBC 17.7* 03/10/2012   HGB 10.6* 03/10/2012   HCT 33.8* 03/10/2012   MCV 93.4 03/10/2012   PLT 382 03/10/2012      Chemistry      Component Value Date/Time   NA 137 02/25/2012 0926   NA 131* 01/07/2012 1746   NA 141 12/17/2011 1144   K 3.2* 02/25/2012 0926   K 4.3 01/07/2012 1746   CL 100 02/25/2012 0926   CL 96 01/07/2012 1746   CO2 26 02/25/2012 0926   CO2 27 01/07/2012 1746   BUN 19.0 02/25/2012 0926   BUN 19 01/07/2012 1746   BUN 15 12/17/2011 1144   CREATININE 0.6 02/25/2012 0926   CREATININE 0.61 01/07/2012 1746      Component Value Date/Time   CALCIUM 8.3* 02/25/2012 0926   CALCIUM 8.1* 01/07/2012 1746   ALKPHOS 74 02/25/2012 0926   ALKPHOS 64 01/07/2012 1746   AST 18 02/25/2012 0926   AST 16 01/07/2012 1746   ALT 26 02/25/2012 0926   ALT 11 01/07/2012 1746   BILITOT 0.31 02/25/2012 0926   BILITOT 0.1* 01/07/2012 1746       RADIOGRAPHIC STUDIES:  No results found.  ASSESSMENT: 67 year old female with:  #1 DCIS of the breast. She is on tamoxifen 20 mg daily. However I have recommended that we discontinue the tamoxifen for now  due to her recent diagnosis of ovarian cancer.  #2.with advanced primary ovarian carcinoma  she is receiving systemic chemotherapy consisting of Taxol and carboplatinum. Patient presented originally with bilateral pleural effusions. She underwent a Pleurx catheter placement. After that she was started on her chemotherapy. She has been seen by gynecologic oncology.  #3 coronary  artery disease: Patient is currently  on Coreg, Toprol XL   PLAN: #1patient will proceed with cycle #4 of her scheduled chemotherapy.  #2 we will plan on referring her back to gynecologic oncology I will ask them to set up an appointment with Dr. Cleda Mccreedy.  #3 she will return in one week's time for interim labs.   All questions were answered. The patient knows to call the clinic with any problems, questions or concerns. We can certainly see the patient much sooner if necessary.  I spent 25 minutes counseling the patient face to face. The total time spent in the appointment was 30 minutes.  Drue Second, MD Medical/Oncology [facility] [phone] (beeper) [phone] (Office)

## 2012-03-11 ENCOUNTER — Ambulatory Visit (HOSPITAL_BASED_OUTPATIENT_CLINIC_OR_DEPARTMENT_OTHER): Payer: BC Managed Care – PPO

## 2012-03-11 VITALS — BP 143/85 | HR 86 | Temp 97.7°F

## 2012-03-11 DIAGNOSIS — C569 Malignant neoplasm of unspecified ovary: Secondary | ICD-10-CM

## 2012-03-11 DIAGNOSIS — D0590 Unspecified type of carcinoma in situ of unspecified breast: Secondary | ICD-10-CM

## 2012-03-11 DIAGNOSIS — Z5189 Encounter for other specified aftercare: Secondary | ICD-10-CM

## 2012-03-11 MED ORDER — PEGFILGRASTIM INJECTION 6 MG/0.6ML
6.0000 mg | Freq: Once | SUBCUTANEOUS | Status: AC
  Administered 2012-03-11: 6 mg via SUBCUTANEOUS

## 2012-03-11 NOTE — Patient Instructions (Addendum)

## 2012-03-13 ENCOUNTER — Other Ambulatory Visit: Payer: Self-pay | Admitting: Oncology

## 2012-03-13 ENCOUNTER — Ambulatory Visit (HOSPITAL_COMMUNITY)
Admission: RE | Admit: 2012-03-13 | Discharge: 2012-03-13 | Disposition: A | Discharge status: Home / Self Care | Payer: BC Managed Care – PPO | Source: Ambulatory Visit | Attending: Oncology | Admitting: Oncology

## 2012-03-13 DIAGNOSIS — I208 Other forms of angina pectoris: Secondary | ICD-10-CM

## 2012-03-13 DIAGNOSIS — R229 Localized swelling, mass and lump, unspecified: Secondary | ICD-10-CM

## 2012-03-13 DIAGNOSIS — C569 Malignant neoplasm of unspecified ovary: Secondary | ICD-10-CM | POA: Insufficient documentation

## 2012-03-13 DIAGNOSIS — R188 Other ascites: Secondary | ICD-10-CM

## 2012-03-13 DIAGNOSIS — E7800 Pure hypercholesterolemia, unspecified: Secondary | ICD-10-CM

## 2012-03-13 DIAGNOSIS — C50919 Malignant neoplasm of unspecified site of unspecified female breast: Secondary | ICD-10-CM

## 2012-03-13 DIAGNOSIS — R000 Tachycardia, unspecified: Secondary | ICD-10-CM

## 2012-03-13 DIAGNOSIS — I10 Essential (primary) hypertension: Secondary | ICD-10-CM

## 2012-03-13 DIAGNOSIS — I2510 Atherosclerotic heart disease of native coronary artery without angina pectoris: Secondary | ICD-10-CM

## 2012-03-13 DIAGNOSIS — R5383 Other fatigue: Secondary | ICD-10-CM

## 2012-03-13 DIAGNOSIS — R0600 Dyspnea, unspecified: Secondary | ICD-10-CM

## 2012-03-13 DIAGNOSIS — C786 Secondary malignant neoplasm of retroperitoneum and peritoneum: Secondary | ICD-10-CM | POA: Insufficient documentation

## 2012-03-13 DIAGNOSIS — C801 Malignant (primary) neoplasm, unspecified: Secondary | ICD-10-CM

## 2012-03-13 DIAGNOSIS — J90 Pleural effusion, not elsewhere classified: Secondary | ICD-10-CM

## 2012-03-13 DIAGNOSIS — R55 Syncope and collapse: Secondary | ICD-10-CM

## 2012-03-13 DIAGNOSIS — Z79810 Long term (current) use of selective estrogen receptor modulators (SERMs): Secondary | ICD-10-CM

## 2012-03-13 DIAGNOSIS — I200 Unstable angina: Secondary | ICD-10-CM

## 2012-03-13 NOTE — Progress Notes (Signed)
Patient ID: [name] [name], female   DOB: [birth year], 67 y.o.   MRN: [id number] Pt presented today for US guided paracentesis. Limited US of abdomen in all four quadrants reveals no significant ascites. Procedure was cancelled and pt informed.

## 2012-03-17 ENCOUNTER — Other Ambulatory Visit (HOSPITAL_BASED_OUTPATIENT_CLINIC_OR_DEPARTMENT_OTHER): Payer: BC Managed Care – PPO | Admitting: Lab

## 2012-03-17 ENCOUNTER — Ambulatory Visit (HOSPITAL_BASED_OUTPATIENT_CLINIC_OR_DEPARTMENT_OTHER): Payer: Medicare Other | Admitting: Adult Health

## 2012-03-17 ENCOUNTER — Encounter: Payer: Self-pay | Admitting: Adult Health

## 2012-03-17 VITALS — BP 120/82 | HR 80 | Temp 98.1°F | Resp 18 | Ht 66.0 in | Wt 146.3 lb

## 2012-03-17 DIAGNOSIS — B370 Candidal stomatitis: Secondary | ICD-10-CM

## 2012-03-17 DIAGNOSIS — G589 Mononeuropathy, unspecified: Secondary | ICD-10-CM

## 2012-03-17 DIAGNOSIS — C569 Malignant neoplasm of unspecified ovary: Secondary | ICD-10-CM

## 2012-03-17 DIAGNOSIS — C786 Secondary malignant neoplasm of retroperitoneum and peritoneum: Secondary | ICD-10-CM

## 2012-03-17 DIAGNOSIS — D0590 Unspecified type of carcinoma in situ of unspecified breast: Secondary | ICD-10-CM

## 2012-03-17 LAB — CBC WITH DIFFERENTIAL/PLATELET
BASO%: 0.1 % (ref 0.0–2.0)
EOS%: 0.1 % (ref 0.0–7.0)
HCT: 31.9 % — ABNORMAL LOW (ref 34.8–46.6)
MCH: 30.1 pg (ref 25.1–34.0)
MCHC: 32.8 g/dL (ref 31.5–36.0)
NEUT%: 91.4 % — ABNORMAL HIGH (ref 38.4–76.8)
lymph#: 1.7 10*3/uL (ref 0.9–3.3)

## 2012-03-17 LAB — COMPREHENSIVE METABOLIC PANEL (CC13)
ALT: 35 U/L (ref 0–55)
AST: 23 U/L (ref 5–34)
Alkaline Phosphatase: 107 U/L (ref 40–150)
Chloride: 102 mEq/L (ref 98–107)
Creatinine: 0.7 mg/dL (ref 0.6–1.1)
Total Bilirubin: 0.27 mg/dL (ref 0.20–1.20)

## 2012-03-17 NOTE — Progress Notes (Signed)
OFFICE PROGRESS NOTE  CC  KNAPP,[name], MD [address] [place] [address] Dr. Emelia Loron Dr. 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  PRIOR THERAPY: #1. S/P left breast lumpectomy in March 2011 after she had a screen detected 1.0 cm ER+, PR+, intermediate grade DCIS. Patient had 3 sentinel biopsied all of them were negative for metastatic disease. Patient underwent radiation therapy between 06/05/2009 through 07/02/2009. She was then begun on tamoxifen 20 mg daily in August 2011.  #2 new diagnosis of gynecologic malignancy presenting with abdominal mass and pleural effusions. Patient was recently hospitalized with shortness of breath she was discovered to have malignant pleural effusion she is status post Pleurx catheter is. She also had malignant ascites she's had several paracentesis procedures performed during her hospitalization. During her hospitalization she was seen by gynecologic oncology. She also went on to receive chemotherapy consisting of Taxol and carboplatinum this is her cycle 1. Her chemotherapy will be given every 21 days with day 2 Neulasta.    CURRENT THERAPY: Cycle 4 day 8 of Taxol and carboplatinum with Neulasta support  INTERVAL HISTORY: [name] [name] 67 y.o. female returns for follow up visit.  [name] doing well.  She was seen last week and had fluid on her abdomen.  She couldn't get the paracentesis b/c she needed to receive chemo, by Monday when they did the ultrasound, they couldn't find fluid to tap.  She feels like her abd has gone down some.  Her numbness remains very stable. Otherwise, she tolerated her last weeks chemotherapy well.  [name] eating and drinking, and denies fevers, chills, pain or any other concerns.  She continues to have her bilateral pleurx catheters drained every week.    MEDICAL HISTORY: Past Medical History  Diagnosis Date  . Interstitial  cystitis     on chronic antibiotics  . Hyperlipidemia   . Frequent UTI     on prophylaxis  . 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 CAD (coronary artery disease)     a. s/p CABG 7/13;   b.  LHC 12/01/11:  pLAD 70%, mLAD 40%, CFX 40-50% prior to takeoff of the OM2, oRCA occluded, mid vessel filled via R->R collaterals and distal vessel filled by L->R collaterals, S-OM1/OM2 (small and diffusely dz) with mid 90% stenosis, 90% at OM1 anastomotic site, continuation of OM2 occluded, S-Dx patent, S-PDA occluded, L-LAD ok, EF 55-65%  => Med Rx rec.  [name] Hx of echocardiogram     Echo 5/13: EF 60-65%, grade 2 diastolic dysfunction  . Hypercholesterolemia   . Pleural effusion 12/28/11    s/p pleurx catheter  . GERD (gastroesophageal reflux disease)   . Arthritis     "just a little; lower back" (12/28/11)  . Gout attack 08/2011    related to "stress post OHS"  . Depression   . Breast cancer     "left"; on Tamoxifen  . S/P thoracentesis 12/28/11    "for pleural effusion" (12/28/2011)  . Ovarian cancer   . Tachycardia     ALLERGIES:  is allergic to codeine; isosorbide; other; phenothiazines; talwin; and yellow jacket venom.  MEDICATIONS:  Current Outpatient Prescriptions  Medication Sig Dispense Refill  . ALPRAZolam (XANAX) 0.25 MG tablet Take 1 tablet (0.25 mg total) by mouth every 8 (eight) hours as needed for anxiety.  30 tablet  0  . Alum & Mag Hydroxide-Simeth (MAGIC MOUTHWASH) SOLN Take 5 mLs by mouth 4 (  four) times daily as needed.  240 mL  6  . aspirin 325 MG tablet Take 325 mg by mouth daily.      [name] atorvastatin (LIPITOR) 20 MG tablet Take 20 mg by mouth daily.      . B Complex-C (SUPER B COMPLEX PO) Take 1 tablet by mouth daily.      . carvedilol (COREG) 25 MG tablet Take 1 tablet (25 mg total) by mouth 2 (two) times daily.  180 tablet  3  . dexamethasone (DECADRON) 4 MG tablet Take 2 tablets (8 mg total) by mouth 2 (two) times daily with a meal. Take two times a day starting the day after  chemotherapy for 3 days.  30 tablet  1  . dexamethasone (DECADRON) 4 MG tablet Take 1 by mouth daily  30 tablet  6  . Docusate Calcium (STOOL SOFTENER PO) Take 2 capsules by mouth every morning.      [name] HYDROcodone-acetaminophen (NORCO/VICODIN) 5-325 MG per tablet Take 1-2 tablets by mouth every 4 (four) hours as needed.  30 tablet  0  . LORazepam (ATIVAN) 0.5 MG tablet Take 1 tablet (0.5 mg total) by mouth every 6 (six) hours as needed for anxiety (Nausea/Vomiting).  60 tablet  0  . nystatin (MYCOSTATIN) 100000 UNIT/ML suspension Take 5 mLs (500,000 Units total) by mouth 4 (four) times daily.  60 mL  6  . omeprazole (PRILOSEC) 20 MG capsule Take 1 capsule (20 mg total) by mouth daily.  30 capsule  6  . ondansetron (ZOFRAN) 8 MG tablet Take 1 tablet (8 mg total) by mouth 2 (two) times daily. Take two times a day starting the day after chemo for 3 days. Then take two times a day as needed for nausea or vomiting.  30 tablet  1  . oxybutynin (DITROPAN-XL) 10 MG 24 hr tablet Take 10 mg by mouth daily.      Bertram Gala Glycol-Propyl Glycol 0.4-0.3 % SOLN Place 1 drop into both eyes every morning.       . prochlorperazine (COMPAZINE) 10 MG tablet as needed.      . prochlorperazine (COMPAZINE) 25 MG suppository as needed.      . SF 5000 PLUS 1.1 % CREA dental cream 1.1 %.      . trimethoprim (TRIMPEX) 100 MG tablet Take 100 mg by mouth daily.      [name] EPINEPHrine (EPIPEN 2-PAK) 0.3 mg/0.3 mL DEVI Inject 0.3 mg into the muscle.       . metoprolol succinate (TOPROL-XL) 50 MG 24 hr tablet 25 mg Twice daily.      . nitroGLYCERIN (NITROSTAT) 0.4 MG SL tablet Place 0.4 mg under the tongue every 5 (five) minutes as needed. Chest pain       No current facility-administered medications for this visit.   Facility-Administered Medications Ordered in Other Visits  Medication Dose Route Frequency Provider Last Rate Last Dose  . influenza  inactive virus vaccine (FLUZONE/FLUARIX) injection 0.5 mL  0.5 mL Intramuscular  Once Joselyn Arrow, MD        SURGICAL HISTORY:  Past Surgical History  Procedure Date  . Breast lumpectomy 04/2009    left  . Cesarean section 1973; 1976  . Muscle release 1960    L neck and chest.; "when I was 13; pneumonia settled in my left neck"  . Coronary artery bypass graft 08/18/2011    Procedure: CORONARY ARTERY BYPASS GRAFTING (CABG);  Surgeon: Alleen Borne, MD;  Location: [facility] OR;  Service: Open  Heart Surgery;  Laterality: N/A;  Coronary Artery Bypass Graft times five utilizing the left internal mammary artery and the left greater saphenous vein harvested endoscopically.  . Abdominal hysterectomy 1976  . Appendectomy 1976  . Portacath placement 01/06/2012    Procedure: INSERTION PORT-A-CATH;  Surgeon: Alleen Borne, MD;  Location: [facility] OR;  Service: Thoracic;  Laterality: Left;  . Chest tube insertion 01/06/2012    Procedure: INSERTION PLEURAL DRAINAGE CATHETER;  Surgeon: Alleen Borne, MD;  Location: [facility] OR;  Service: Thoracic;  Laterality: Bilateral;    REVIEW OF SYSTEMS:   General: fatigue (+), night sweats (-), fever (-), pain (-) Lymph: palpable nodes (-) HEENT: vision changes (-), mucositis (-), gum bleeding (-), epistaxis (-) Cardiovascular: chest pain (-), palpitations (-) Pulmonary: shortness of breath (-), dyspnea on exertion (-), cough (-), hemoptysis (-) GI:  Early satiety (-), melena (-), dysphagia (-), nausea/vomiting (-), diarrhea (-) GU: dysuria (-), hematuria (-), incontinence (-) Musculoskeletal: joint swelling (-), joint pain (-), back pain (-) Neuro: weakness (-), numbness (+), headache (-), confusion (-) Skin: Rash (-), lesions (-), dryness (-) Psych: depression (-), suicidal/homicidal ideation (-), feeling of hopelessness (-)   PHYSICAL EXAMINATION:  BP 120/82  Pulse 80  Temp 98.1 F (36.7 C) (Oral)  Resp 18  Ht 5\' 6"  (1.676 m)  Wt 146 lb 5 oz (66.367 kg)  BMI 23.62 kg/m2 General: Patient is an ill appearing female in no acute distress HEENT:  PERRLA, sclerae anicteric no conjunctival pallor, MMM Neck: supple, no palpable adenopathy Lungs: clear to auscultation bilaterally, no wheezes, rhonchi, or rales Cardiovascular: regular rate rhythm, S1, S2, no murmurs, rubs or gallops Abdomen: Soft, non-tender, non-distended, normoactive bowel sounds, no HSM, pleurx dressings clean dry and intact Extremities: warm and well perfused, no clubbing, cyanosis, or edema Skin: No rashes or lesions Neuro: Non-focal Bilateral Breast Exam: no masses or nipple discharge, no skin changes, left breast has a well healaed scar, no nodularity or masses. ECOG PERFORMANCE STATUS: 0 - Asymptomatic  LABORATORY DATA: Lab Results  Component Value Date   WBC 24.5* 03/17/2012   HGB 10.5* 03/17/2012   HCT 31.9* 03/17/2012   MCV 92.0 03/17/2012   PLT 337 03/17/2012      Chemistry      Component Value Date/Time   NA 138 03/17/2012 1044   NA 131* 01/07/2012 1746   NA 141 12/17/2011 1144   K 3.6 03/17/2012 1044   K 4.3 01/07/2012 1746   CL 102 03/17/2012 1044   CL 96 01/07/2012 1746   CO2 21* 03/17/2012 1044   CO2 27 01/07/2012 1746   BUN 22.6 03/17/2012 1044   BUN 19 01/07/2012 1746   BUN 15 12/17/2011 1144   CREATININE 0.7 03/17/2012 1044   CREATININE 0.61 01/07/2012 1746      Component Value Date/Time   CALCIUM 8.8 03/17/2012 1044   CALCIUM 8.1* 01/07/2012 1746   ALKPHOS 107 03/17/2012 1044   ALKPHOS 64 01/07/2012 1746   AST 23 03/17/2012 1044   AST 16 01/07/2012 1746   ALT 35 03/17/2012 1044   ALT 11 01/07/2012 1746   BILITOT 0.27 03/17/2012 1044   BILITOT 0.1* 01/07/2012 1746       RADIOGRAPHIC STUDIES:  No results found.  ASSESSMENT: 67 year old female with:  #1 DCIS of the breast. She is on tamoxifen 20 mg daily. However I have recommended that we discontinue the tamoxifen for now a do to her recent diagnosis of ovarian cancer.  #2 recent diagnosis  of gynecologic malignancy patient is status post cycle 1 of Taxol and carboplatinum given during  her recent hospitalization. Overall she tolerated it well.  #3 oral dental hygiene/thrush  #4 Neuropathy  PLAN:  #1 Ms Freas is doing well after chemotherapy.  She does not need IV fluids today.  She had an interval CT scan on 1/8 and it revealed a mixed response to chemotherapy.  She had no disease in the chest, minimal bilateral pleural effusions.  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 Dr. Welton Flakes discussed these results with her, and we will continue chemotherapy for a total of 6 cycles.    #2 She will continue using the magic mouthwash and nystatin for her thrush.    #3 She will continue the Super B complex. Her numbness is mild and stable from her last visit.   #4 She will return on 2/14 for labs, appt, and cycle 5 of chemotherapy.    All questions were answered. The patient knows to call the clinic with any problems, questions or concerns. We can certainly see the patient much sooner if necessary.  I spent 25 minutes counseling the patient face to face. The total time spent in the appointment was 30 minutes.  [name] Ouch Lyn Hollingshead, NP Medical Oncology [facility] Phone: [phone]

## 2012-03-17 NOTE — Patient Instructions (Addendum)
Doing well.  You do not need IV fluids today.  We will see you back on 03/31/12 for your next cycle of chemotherapy.  Please call us if you have any questions or concerns.

## 2012-03-22 ENCOUNTER — Encounter: Payer: Self-pay | Admitting: Gynecologic Oncology

## 2012-03-22 ENCOUNTER — Ambulatory Visit: Payer: BC Managed Care – PPO | Attending: Gynecologic Oncology | Admitting: Gynecologic Oncology

## 2012-03-22 VITALS — BP 128/76 | HR 84 | Temp 97.6°F | Resp 22 | Ht 66.0 in | Wt 151.8 lb

## 2012-03-22 DIAGNOSIS — C786 Secondary malignant neoplasm of retroperitoneum and peritoneum: Secondary | ICD-10-CM | POA: Insufficient documentation

## 2012-03-22 DIAGNOSIS — R188 Other ascites: Secondary | ICD-10-CM | POA: Insufficient documentation

## 2012-03-22 DIAGNOSIS — C569 Malignant neoplasm of unspecified ovary: Secondary | ICD-10-CM | POA: Insufficient documentation

## 2012-03-22 DIAGNOSIS — Z951 Presence of aortocoronary bypass graft: Secondary | ICD-10-CM | POA: Insufficient documentation

## 2012-03-22 DIAGNOSIS — J90 Pleural effusion, not elsewhere classified: Secondary | ICD-10-CM | POA: Insufficient documentation

## 2012-03-22 DIAGNOSIS — I2510 Atherosclerotic heart disease of native coronary artery without angina pectoris: Secondary | ICD-10-CM | POA: Insufficient documentation

## 2012-03-22 NOTE — Patient Instructions (Signed)
Follow up with Dr. Khan as scheduled

## 2012-03-22 NOTE — Progress Notes (Signed)
Consult Note: Gyn-Onc  [name] [name] 67 y.o. female  CC:  Chief Complaint  Patient presents with  . Ovarian Cancer    Follow up    HPI: Patient is a 67 year old Caucasian female with a past medical history of coronary artery disease status post CABG earlier this year, unfortunately following a few months after CABG 2 of her grafts were occluded, history of breast cancer on chronic tamoxifen therapy who presented to the hospital for evaluation of the above noted complaints. Per patient her shortness of breath and cough have been going on for at least a month if not more. She claims that her shortness of breath is mostly exertional, and recently it has started to get worse and now she cannot even walk to the mailbox without stopping multiple times. She does not have orthopnea or PND. She does not have leg swelling. She claims that her cough is mostly nonproductive in nature. She denies any subjective fever. She claims that over the past few weeks she has been having issues with tachycardia and her Coreg dosing has been adjusted as well. Unfortunately over the weekend her shortness of breath, cough got significantly worse, she got in touch with her cardiologist today who advised her to come to the emergency room for further evaluation and treatment. In the emergency room she was found to have a large left sided pleural effusion on a chest x-ray. In addition to the shortness of breath she states her several months essentially for the time of her CABG she has had some abdominal discomfort which she has noticed subjective bloating. She felt some of this is related to her bowels. She has noticed progressive early satiety. [name] not really had any significant nausea and vomiting.  She underwent a thoracentesis on November 13. Your revealed malignant cells. The differential considerations for the cytology and the immunophenotype included primary mammary carcinoma in a primary gynecologic carcinoma. In  addition, should a paracentesis on November 18 that revealed malignant cells similar to those seen on her this cytology specimens.   CT imaging revealed:  Findings: Bilateral pleural effusions, left greater than right. Associated airspace consolidations. Heart size within normal limits. Coronary artery calcification.  Unremarkable liver, spleen, pancreas, biliary system, adrenal glands. Symmetric renal enhancement. No hydronephrosis or hydroureter. No bowel obstruction. No CT evidence for colitis. There is a  moderate amount of ascites. Peritoneal carcinomatosis, with the largest conglomerate in the left paracolic gutter, measuring approximately 7.7 x 6.2 cm and displacing the small bowel centrally. No free intraperitoneal air. No lymphadenopathy.  Thin-walled bladder. Uterus not identified. 5.4 cm right adnexal cyst. Enlarged inguinal lymph nodes, measuring up to 13 mm on the left. There is scattered atherosclerotic calcification of the aorta and its branches. No aneurysmal dilatation.  Multilevel degenerative changes without acute osseous finding. Multiple subcutaneous rounded densities are nonspecific and may  reflect sequelae of prior injection sites, however metastases not excluded.   IMPRESSION:  Bilateral pleural effusions and associated consolidations are partially imaged. Peritoneal carcinomatosis and a moderate amount of ascites. 5.4 cm right adnexal cyst is indeterminate. Inguinal lymphadenopathy.   Ultrasound on November 17 revealed:  Uterus: Surgically absent.  Right ovary: Complex cystic right adnexal mass measuring 3.1 x 5.7 x 4.5 cm with surrounding rim of suspected ovarian tissue.  Left ovary: Not visualized transabdominally/transvaginally.  Other findings: Moderate pelvic ascites with internal echoes.  IMPRESSION:  5.7 cm complex cystic right ovarian lesion. Given the associated findings on CT, this appearance is worrisome for primary  ovarian neoplasm.  Status post hysterectomy.  Left ovary is not discretely visualized. Correlate with surgical history.  We are asked to see her to offer recommendations regarding the treatment of a possible ovarian or intrapelvic serous carcinoma. Her CA 125 is elevated at 392.9. Her CEA is less than 0.5. In addition her CA 27-29 is in the normal range at 35 but is elevated over her baseline one year ago.   Interval History:  Of 4 cycles of paclitaxel and carboplatin. Should a CT scan after her third cycle of chemotherapy that revealed: IMPRESSION:  Trace bilateral pleural effusions with indwelling pleural drains.  Otherwise, no evidence of metastatic disease in the chest.  CT ABDOMEN AND PELVIS  Findings: Peritoneal carcinomatosis overlying the right hepatic dome (series 2/image 52). Liver is otherwise unremarkable.  Spleen, pancreas, and adrenal glands are within normal limits.  Gallbladder is unremarkable. No intrahepatic or extrahepatic ductal dilatation.  Kidneys are within normal limits. No hydronephrosis.  No evidence of bowel obstruction.  Atherosclerotic calcifications of the abdominal aorta and branch vessels.  10 mm short axis retrocaval node (series 2/image 61). Additional small retroperitoneal nodes which do not meet pathologic CT size  criteria. 12 mm right inguinal node (series 2/image 115), previously 10 mm. 15 mm left inguinal node (series 2/image 117),  previously 13 mm.  Near complete resolution of prior abdominopelvic ascites.  Peritoneal carcinomatosis, similar versus mildly improved.  Dominant lesion in the left mid abdomen measures 10.6 x 5.8 cm, previously 12.1 x 6.2 cm when measured in a similar fashion.  Additional omental caking beneath the lower anterior abdominal wall.  7.1 x 4.5 cm complex cystic right ovarian mass (series 2/image 12), previously 6 x 1 x 3.9 cm.  Status post hysterectomy.  Bladder is unremarkable.  Nodularity/thickening in the periumbilical region are worrisome for tumor (series 2/image  82). Additional subcutaneous lesions in the  anterior abdominal wall are nonspecific.  Mild degenerative changes of the visualized thoracolumbar spine.  IMPRESSION:  7.1 x 4.5 cm complex cystic right ovarian mass, mildly increased. Peritoneal carcinomatosis, as described above, stable versus mildly  improved.  Near complete resolution of prior abdominopelvic ascites.  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  Her CA 125 is also decreased from over 300 at time of diagnosis to 90 in December. She did not have a CA 125 in January. She does need for her pleural fluid a record sheet. Over time it has decreased she's now only draining fluid about once a week. The right side but the left in the left. On average the right side is putting out you will from 10 200 cc every week in the left side is putting out 15-225 cc every week. Her shortness of breath is markedly improved.  Review of Systems She and her husband with a list of questions today. They want to discuss the results of her CT scan from January which we did. She states that Dr. Welton Flakes had also discussed this with her. Her chemotherapy is going fairly well. She is lethargic about 7-10 days and has bone pain from the Neulasta. She does use ibuprofen Claritin without significant relief. She states her feet hurt so bad this past cycle she felt like every bone in her foot was broken. 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 Her shortness of breath is much better her breathing is much better. [name] walking writing the exercise bike every week. She does tend to  get tired. Her appetite is increased. She feels that her abdominal ascites has decreased overall. She denies a change about bladder habits. She has any chest pain. She has a nausea vomiting. She denies any unintentional weight loss and and if anything has some increased weight gain due to increasing more by mouth intake. She  denies any early satiety. The remainder for 10 systems review is negative.  Current Meds:  Outpatient Encounter Prescriptions as of 03/22/2012  Medication Sig Dispense Refill  . ALPRAZolam (XANAX) 0.25 MG tablet Take 1 tablet (0.25 mg total) by mouth every 8 (eight) hours as needed for anxiety.  30 tablet  0  . Alum & Mag Hydroxide-Simeth (MAGIC MOUTHWASH) SOLN Take 5 mLs by mouth 4 (four) times daily as needed.  240 mL  6  . aspirin 325 MG tablet Take 325 mg by mouth daily.      [name] atorvastatin (LIPITOR) 20 MG tablet Take 20 mg by mouth daily.      . B Complex-C (SUPER B COMPLEX PO) Take 1 tablet by mouth daily.      . carvedilol (COREG) 25 MG tablet Take 1 tablet (25 mg total) by mouth 2 (two) times daily.  180 tablet  3  . dexamethasone (DECADRON) 4 MG tablet Take 2 tablets (8 mg total) by mouth 2 (two) times daily with a meal. Take two times a day starting the day after chemotherapy for 3 days.  30 tablet  1  . dexamethasone (DECADRON) 4 MG tablet Take 1 by mouth daily  30 tablet  6  . Docusate Calcium (STOOL SOFTENER PO) Take 2 capsules by mouth every morning.      [name] EPINEPHrine (EPIPEN 2-PAK) 0.3 mg/0.3 mL DEVI Inject 0.3 mg into the muscle.       [name] HYDROcodone-acetaminophen (NORCO/VICODIN) 5-325 MG per tablet Take 1-2 tablets by mouth every 4 (four) hours as needed.  30 tablet  0  . LORazepam (ATIVAN) 0.5 MG tablet Take 1 tablet (0.5 mg total) by mouth every 6 (six) hours as needed for anxiety (Nausea/Vomiting).  60 tablet  0  . metoprolol succinate (TOPROL-XL) 50 MG 24 hr tablet 25 mg Twice daily.      . nitroGLYCERIN (NITROSTAT) 0.4 MG SL tablet Place 0.4 mg under the tongue every 5 (five) minutes as needed. Chest pain      . nystatin (MYCOSTATIN) 100000 UNIT/ML suspension Take 5 mLs (500,000 Units total) by mouth 4 (four) times daily.  60 mL  6  . omeprazole (PRILOSEC) 20 MG capsule Take 1 capsule (20 mg total) by mouth daily.  30 capsule  6  . ondansetron (ZOFRAN) 8 MG tablet Take 1  tablet (8 mg total) by mouth 2 (two) times daily. Take two times a day starting the day after chemo for 3 days. Then take two times a day as needed for nausea or vomiting.  30 tablet  1  . oxybutynin (DITROPAN-XL) 10 MG 24 hr tablet Take 10 mg by mouth daily.      Bertram Gala Glycol-Propyl Glycol 0.4-0.3 % SOLN Place 1 drop into both eyes every morning.       . prochlorperazine (COMPAZINE) 10 MG tablet as needed.      . prochlorperazine (COMPAZINE) 25 MG suppository as needed.      . SF 5000 PLUS 1.1 % CREA dental cream 1.1 %.      . trimethoprim (TRIMPEX) 100 MG tablet Take 100 mg by mouth daily.       Facility-Administered  Encounter Medications as of 03/22/2012  Medication Dose Route Frequency Provider Last Rate Last Dose  . influenza  inactive virus vaccine (FLUZONE/FLUARIX) injection 0.5 mL  0.5 mL Intramuscular Once Joselyn Arrow, MD        Allergy:  Allergies  Allergen Reactions  . Codeine Nausea And Vomiting       . Isosorbide Other (See Comments)    Extreme headaches  . Other Swelling    All Lip moisturizers except Vaseline.  . Phenothiazines Other (See Comments)    Makes her stop breathing.  . Talwin (Pentazocine) Nausea And Vomiting  . Yellow Jacket Venom Anaphylaxis    Social Hx:   History   Social History  . Marital Status: Married    Spouse Name: N/A    Number of Children: 1  . Years of Education: N/A   Occupational History  . media Geophysicist/field seismologist and ESL coordinator [employer]   Social History Main Topics  . Smoking status: Former Smoker -- 0.1 packs/day for 15 years    Types: Cigarettes    Quit date: 02/15/2001  . Smokeless tobacco: Never Used  . Alcohol Use: 4.2 oz/week    7 Shots of liquor per week     Comment: 12/28/11 "1 gin & tonic q hs", 2/5/141-2 glass a wine occa  . Drug Use: No  . Sexually Active: Yes   Other Topics Concern  . Not on file   Social History Narrative   Lives with husband and dog    Past Surgical Hx:  Past Surgical  History  Procedure Date  . Breast lumpectomy 04/2009    left  . Cesarean section 1973; 1976  . Muscle release 1960    L neck and chest.; "when I was 13; pneumonia settled in my left neck"  . Coronary artery bypass graft 08/18/2011    Procedure: CORONARY ARTERY BYPASS GRAFTING (CABG);  Surgeon: Alleen Borne, MD;  Location: [facility] OR;  Service: Open Heart Surgery;  Laterality: N/A;  Coronary Artery Bypass Graft times five utilizing the left internal mammary artery and the left greater saphenous vein harvested endoscopically.  . Abdominal hysterectomy 1976  . Appendectomy 1976  . Portacath placement 01/06/2012    Procedure: INSERTION PORT-A-CATH;  Surgeon: Alleen Borne, MD;  Location: [facility] OR;  Service: Thoracic;  Laterality: Left;  . Chest tube insertion 01/06/2012    Procedure: INSERTION PLEURAL DRAINAGE CATHETER;  Surgeon: Alleen Borne, MD;  Location: [facility] OR;  Service: Thoracic;  Laterality: Bilateral;    Past Medical Hx:  Past Medical History  Diagnosis Date  . Interstitial cystitis     on chronic antibiotics  . Hyperlipidemia   . Frequent UTI     on prophylaxis  . 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 CAD (coronary artery disease)     a. s/p CABG 7/13;   b.  LHC 12/01/11:  pLAD 70%, mLAD 40%, CFX 40-50% prior to takeoff of the OM2, oRCA occluded, mid vessel filled via R->R collaterals and distal vessel filled by L->R collaterals, S-OM1/OM2 (small and diffusely dz) with mid 90% stenosis, 90% at OM1 anastomotic site, continuation of OM2 occluded, S-Dx patent, S-PDA occluded, L-LAD ok, EF 55-65%  => Med Rx rec.  [name] Hx of echocardiogram     Echo 5/13: EF 60-65%, grade 2 diastolic dysfunction  . Hypercholesterolemia   . Pleural effusion 12/28/11    s/p pleurx catheter  . GERD (gastroesophageal reflux disease)   . Arthritis     "just a  little; lower back" (12/28/11)  . Gout attack 08/2011    related to "stress post OHS"  . Depression   . Breast cancer     "left"; on Tamoxifen  . S/P  thoracentesis 12/28/11    "for pleural effusion" (12/28/2011)  . Ovarian cancer   . Tachycardia     Family Hx:  Family History  Problem Relation Age of Onset  . Cancer Mother 58    breast cancer  . Heart disease Father 50    MI at 28, CABG in 72's  . Hepatitis Father     C from blood transfusion  . Heart disease Brother     CABG in 102's  . Heart disease Paternal Aunt   . Heart disease Paternal Uncle   . Heart disease Paternal Grandfather   . Diabetes Neg Hx     Vitals:  Blood pressure 128/76, pulse 84, temperature 97.6 F (36.4 C), temperature source Oral, resp. rate 22, height 5\' 6"  (1.676 m), weight 151 lb 12.8 oz (68.856 kg).  Physical Exam: Well-nourished well-developed female in no acute distress.  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 She is malignant ascites consistent with a gynecologic primary. [name] undergone 4 cycles of paclitaxel and carboplatin based chemotherapy. Based on CA 125 she's having a response but her response via imaging is not very pronounced. Of note she her performance status has improved and she's having decreased output to her Pleuryx catheters. I believe at this point (have also spoken with Dr. Welton Flakes) that we should complete her 6 cycles of chemotherapy. She'll require imaging post that 6 cycle of chemotherapy and discussion with me regarding him tubal surgery. In the meantime she will contact her cardiologist Dr. Elease Hashimoto her regarding preoperative risk stratification and perioperative care from a cardiac perspective.  Donell Sliwinski A., MD 03/22/2012, 3:54 PM

## 2012-03-28 ENCOUNTER — Telehealth: Payer: Self-pay | Admitting: Cardiovascular Disease

## 2012-03-28 NOTE — Telephone Encounter (Signed)
Will forward to Dr Nahser for review  

## 2012-03-28 NOTE — Telephone Encounter (Signed)
Pt has appt 3/4 to see Dr. Elease Hashimoto and cancer provider wants to know what is her probability of surgery for her cancer with her heart condition and she wants to know what kind of testing she may need to confirm this she just wants to make sure you have enough time to discuss this and get all testing done not just a normal follow up appt

## 2012-03-31 ENCOUNTER — Encounter: Payer: Self-pay | Admitting: Adult Health

## 2012-03-31 ENCOUNTER — Ambulatory Visit (HOSPITAL_BASED_OUTPATIENT_CLINIC_OR_DEPARTMENT_OTHER): Payer: BC Managed Care – PPO

## 2012-03-31 ENCOUNTER — Ambulatory Visit (HOSPITAL_BASED_OUTPATIENT_CLINIC_OR_DEPARTMENT_OTHER): Payer: Medicare Other | Admitting: Oncology

## 2012-03-31 ENCOUNTER — Other Ambulatory Visit (HOSPITAL_BASED_OUTPATIENT_CLINIC_OR_DEPARTMENT_OTHER): Payer: BC Managed Care – PPO | Admitting: Lab

## 2012-03-31 VITALS — BP 121/77 | HR 82 | Temp 98.0°F | Resp 18 | Ht 66.0 in | Wt 157.3 lb

## 2012-03-31 DIAGNOSIS — C801 Malignant (primary) neoplasm, unspecified: Secondary | ICD-10-CM

## 2012-03-31 DIAGNOSIS — Z5111 Encounter for antineoplastic chemotherapy: Secondary | ICD-10-CM

## 2012-03-31 DIAGNOSIS — C786 Secondary malignant neoplasm of retroperitoneum and peritoneum: Secondary | ICD-10-CM

## 2012-03-31 DIAGNOSIS — R188 Other ascites: Secondary | ICD-10-CM

## 2012-03-31 DIAGNOSIS — D0590 Unspecified type of carcinoma in situ of unspecified breast: Secondary | ICD-10-CM

## 2012-03-31 DIAGNOSIS — C569 Malignant neoplasm of unspecified ovary: Secondary | ICD-10-CM

## 2012-03-31 DIAGNOSIS — C50919 Malignant neoplasm of unspecified site of unspecified female breast: Secondary | ICD-10-CM

## 2012-03-31 LAB — CBC WITH DIFFERENTIAL/PLATELET
BASO%: 0.6 % (ref 0.0–2.0)
EOS%: 0.3 % (ref 0.0–7.0)
HCT: 32.3 % — ABNORMAL LOW (ref 34.8–46.6)
LYMPH%: 6.5 % — ABNORMAL LOW (ref 14.0–49.7)
MCH: 30.7 pg (ref 25.1–34.0)
MCHC: 31.9 g/dL (ref 31.5–36.0)
MCV: 96.2 fL (ref 79.5–101.0)
NEUT%: 87.3 % — ABNORMAL HIGH (ref 38.4–76.8)
Platelets: 277 10*3/uL (ref 145–400)

## 2012-03-31 LAB — COMPREHENSIVE METABOLIC PANEL (CC13)
ALT: 54 U/L (ref 0–55)
AST: 20 U/L (ref 5–34)
BUN: 16.9 mg/dL (ref 7.0–26.0)
Creatinine: 0.7 mg/dL (ref 0.6–1.1)
Total Bilirubin: 0.31 mg/dL (ref 0.20–1.20)

## 2012-03-31 MED ORDER — DEXAMETHASONE SODIUM PHOSPHATE 4 MG/ML IJ SOLN
20.0000 mg | Freq: Once | INTRAMUSCULAR | Status: AC
  Administered 2012-03-31: 20 mg via INTRAVENOUS

## 2012-03-31 MED ORDER — SODIUM CHLORIDE 0.9 % IV SOLN
545.5000 mg | Freq: Once | INTRAVENOUS | Status: AC
  Administered 2012-03-31: 550 mg via INTRAVENOUS
  Filled 2012-03-31: qty 55

## 2012-03-31 MED ORDER — SODIUM CHLORIDE 0.9 % IV SOLN
Freq: Once | INTRAVENOUS | Status: AC
  Administered 2012-03-31: 11:00:00 via INTRAVENOUS

## 2012-03-31 MED ORDER — HEPARIN SOD (PORK) LOCK FLUSH 100 UNIT/ML IV SOLN
500.0000 [IU] | Freq: Once | INTRAVENOUS | Status: AC | PRN
  Administered 2012-03-31: 500 [IU]
  Filled 2012-03-31: qty 5

## 2012-03-31 MED ORDER — PACLITAXEL CHEMO INJECTION 300 MG/50ML
175.0000 mg/m2 | Freq: Once | INTRAVENOUS | Status: AC
  Administered 2012-03-31: 288 mg via INTRAVENOUS
  Filled 2012-03-31: qty 48

## 2012-03-31 MED ORDER — DIPHENHYDRAMINE HCL 50 MG/ML IJ SOLN
50.0000 mg | Freq: Once | INTRAMUSCULAR | Status: AC
  Administered 2012-03-31: 50 mg via INTRAVENOUS

## 2012-03-31 MED ORDER — SODIUM CHLORIDE 0.9 % IJ SOLN
10.0000 mL | INTRAMUSCULAR | Status: DC | PRN
  Administered 2012-03-31: 10 mL
  Filled 2012-03-31: qty 10

## 2012-03-31 MED ORDER — FAMOTIDINE IN NACL 20-0.9 MG/50ML-% IV SOLN
20.0000 mg | Freq: Once | INTRAVENOUS | Status: AC
  Administered 2012-03-31: 20 mg via INTRAVENOUS

## 2012-03-31 MED ORDER — ONDANSETRON 16 MG/50ML IVPB (CHCC)
16.0000 mg | Freq: Once | INTRAVENOUS | Status: AC
  Administered 2012-03-31: 16 mg via INTRAVENOUS

## 2012-03-31 NOTE — Progress Notes (Signed)
OFFICE PROGRESS NOTE  CC KNAPP,[name], MD [address] [place] [address] Dr. Emelia Loron Dr. 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  PRIOR THERAPY: #1. S/P left breast lumpectomy in March 2011 after she had a screen detected 1.0 cm ER+, PR+, intermediate grade DCIS. Patient had 3 sentinel biopsied all of them were negative for metastatic disease. Patient underwent radiation therapy between 06/05/2009 through 07/02/2009. She was then begun on tamoxifen 20 mg daily in August 2011.  #2 new diagnosis of gynecologic malignancy presenting with abdominal mass and pleural effusions. Patient was recently hospitalized with shortness of breath she was discovered to have malignant pleural effusion she is status post Pleurx catheter is. She also had malignant ascites she's had several paracentesis procedures performed during her hospitalization. During her hospitalization she was seen by gynecologic oncology.  #3 patient began neoadjuvant chemotherapy consisting of Taxol and carboplatinum. Her first cycle was administered during her hospitalization. Total of 6 cycles of Taxol and carboplatinum are planned. She has also been seen by gynecologic oncology. She will see them again at completion of 6 cycles. We will also plan on doing restaging scans at that time.   CURRENT THERAPY: Cycle 5 day 1 of Taxol and carboplatinum with Neulasta support  INTERVAL HISTORY: [name] [name] 67 y.o. female returns for follow up visit.  [name] doing well.   She does have fatigue after she gets her chemotherapy. She also experiences myalgias after her Neulasta injection. She has not had any fevers chills or night sweats no headaches shortness of breath or chest pains. Remainder of the 10 point review of systems is negative.  MEDICAL HISTORY: Past Medical History  Diagnosis Date  . Interstitial cystitis     on chronic antibiotics   . Hyperlipidemia   . Frequent UTI     on prophylaxis  . 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 CAD (coronary artery disease)     a. s/p CABG 7/13;   b.  LHC 12/01/11:  pLAD 70%, mLAD 40%, CFX 40-50% prior to takeoff of the OM2, oRCA occluded, mid vessel filled via R->R collaterals and distal vessel filled by L->R collaterals, S-OM1/OM2 (small and diffusely dz) with mid 90% stenosis, 90% at OM1 anastomotic site, continuation of OM2 occluded, S-Dx patent, S-PDA occluded, L-LAD ok, EF 55-65%  => Med Rx rec.  [name] Hx of echocardiogram     Echo 5/13: EF 60-65%, grade 2 diastolic dysfunction  . Hypercholesterolemia   . Pleural effusion 12/28/11    s/p pleurx catheter  . GERD (gastroesophageal reflux disease)   . Arthritis     "just a little; lower back" (12/28/11)  . Gout attack 08/2011    related to "stress post OHS"  . Depression   . Breast cancer     "left"; on Tamoxifen  . S/P thoracentesis 12/28/11    "for pleural effusion" (12/28/2011)  . Ovarian cancer   . Tachycardia     ALLERGIES:  is allergic to codeine; isosorbide; other; phenothiazines; talwin; and yellow jacket venom.  MEDICATIONS:  Current Outpatient Prescriptions  Medication Sig Dispense Refill  . Alum & Mag Hydroxide-Simeth (MAGIC MOUTHWASH) SOLN Take 5 mLs by mouth 4 (four) times daily as needed.  240 mL  6  . aspirin 325 MG tablet Take 325 mg by mouth daily.      [name] atorvastatin (LIPITOR) 20 MG tablet Take 20 mg by mouth daily.      [name]  B Complex-C (SUPER B COMPLEX PO) Take 1 tablet by mouth daily.      . carvedilol (COREG) 25 MG tablet Take 1 tablet (25 mg total) by mouth 2 (two) times daily.  180 tablet  3  . dexamethasone (DECADRON) 4 MG tablet Take 2 tablets (8 mg total) by mouth 2 (two) times daily with a meal. Take two times a day starting the day after chemotherapy for 3 days.  30 tablet  1  . dexamethasone (DECADRON) 4 MG tablet Take 1 by mouth daily  30 tablet  6  . Docusate Calcium (STOOL SOFTENER PO) Take 2 capsules by  mouth every morning.      [name] LORazepam (ATIVAN) 0.5 MG tablet Take 1 tablet (0.5 mg total) by mouth every 6 (six) hours as needed for anxiety (Nausea/Vomiting).  60 tablet  0  . metoprolol succinate (TOPROL-XL) 50 MG 24 hr tablet 25 mg Twice daily.      [name] nystatin (MYCOSTATIN) 100000 UNIT/ML suspension Take 5 mLs (500,000 Units total) by mouth 4 (four) times daily.  60 mL  6  . omeprazole (PRILOSEC) 20 MG capsule Take 1 capsule (20 mg total) by mouth daily.  30 capsule  6  . ondansetron (ZOFRAN) 8 MG tablet Take 1 tablet (8 mg total) by mouth 2 (two) times daily. Take two times a day starting the day after chemo for 3 days. Then take two times a day as needed for nausea or vomiting.  30 tablet  1  . oxybutynin (DITROPAN-XL) 10 MG 24 hr tablet Take 10 mg by mouth daily.      Bertram Gala Glycol-Propyl Glycol 0.4-0.3 % SOLN Place 1 drop into both eyes every morning.       . SF 5000 PLUS 1.1 % CREA dental cream 1.1 %.      . trimethoprim (TRIMPEX) 100 MG tablet Take 100 mg by mouth daily.      [name] ALPRAZolam (XANAX) 0.25 MG tablet Take 1 tablet (0.25 mg total) by mouth every 8 (eight) hours as needed for anxiety.  30 tablet  0  . EPINEPHrine (EPIPEN 2-PAK) 0.3 mg/0.3 mL DEVI Inject 0.3 mg into the muscle.       [name] HYDROcodone-acetaminophen (NORCO/VICODIN) 5-325 MG per tablet Take 1-2 tablets by mouth every 4 (four) hours as needed.  30 tablet  0  . nitroGLYCERIN (NITROSTAT) 0.4 MG SL tablet Place 0.4 mg under the tongue every 5 (five) minutes as needed. Chest pain      . prochlorperazine (COMPAZINE) 10 MG tablet as needed.      . prochlorperazine (COMPAZINE) 25 MG suppository as needed.       No current facility-administered medications for this visit.   Facility-Administered Medications Ordered in Other Visits  Medication Dose Route Frequency Provider Last Rate Last Dose  . influenza  inactive virus vaccine (FLUZONE/FLUARIX) injection 0.5 mL  0.5 mL Intramuscular Once Joselyn Arrow, MD        SURGICAL  HISTORY:  Past Surgical History  Procedure Laterality Date  . Breast lumpectomy  04/2009    left  . Cesarean section  1973; 1976  . Muscle release  1960    L neck and chest.; "when I was 13; pneumonia settled in my left neck"  . Coronary artery bypass graft  08/18/2011    Procedure: CORONARY ARTERY BYPASS GRAFTING (CABG);  Surgeon: Alleen Borne, MD;  Location: [facility] OR;  Service: Open Heart Surgery;  Laterality: N/A;  Coronary Artery Bypass Graft times five  utilizing the left internal mammary artery and the left greater saphenous vein harvested endoscopically.  . Abdominal hysterectomy  1976  . Appendectomy  1976  . Portacath placement  01/06/2012    Procedure: INSERTION PORT-A-CATH;  Surgeon: Alleen Borne, MD;  Location: [facility] OR;  Service: Thoracic;  Laterality: Left;  . Chest tube insertion  01/06/2012    Procedure: INSERTION PLEURAL DRAINAGE CATHETER;  Surgeon: Alleen Borne, MD;  Location: [facility] OR;  Service: Thoracic;  Laterality: Bilateral;    REVIEW OF SYSTEMS:   General: fatigue (+), night sweats (-), fever (-), pain (-) Lymph: palpable nodes (-) HEENT: vision changes (-), mucositis (-), gum bleeding (-), epistaxis (-) Cardiovascular: chest pain (-), palpitations (-) Pulmonary: shortness of breath (-), dyspnea on exertion (-), cough (-), hemoptysis (-) GI:  Early satiety (-), melena (-), dysphagia (-), nausea/vomiting (-), diarrhea (-) GU: dysuria (-), hematuria (-), incontinence (-) Musculoskeletal: joint swelling (-), joint pain (-), back pain (-) Neuro: weakness (-), numbness (+), headache (-), confusion (-) Skin: Rash (-), lesions (-), dryness (-) Psych: depression (-), suicidal/homicidal ideation (-), feeling of hopelessness (-)   PHYSICAL EXAMINATION:  BP 121/77  Pulse 82  Temp(Src) 98 F (36.7 C) (Oral)  Resp 18  Ht 5\' 6"  (1.676 m)  Wt 157 lb 5 oz (71.356 kg)  BMI 25.4 kg/m2 General: Patient is an ill appearing female in no acute distress HEENT: PERRLA, sclerae  anicteric no conjunctival pallor, MMM Neck: supple, no palpable adenopathy Lungs: clear to auscultation bilaterally, no wheezes, rhonchi, or rales Cardiovascular: regular rate rhythm, S1, S2, no murmurs, rubs or gallops Abdomen: Soft, non-tender, non-distended, normoactive bowel sounds, no HSM, pleurx dressings clean dry and intact Extremities: warm and well perfused, no clubbing, cyanosis, or edema Skin: No rashes or lesions Neuro: Non-focal Bilateral Breast Exam: no masses or nipple discharge, no skin changes, left breast has a well healaed scar, no nodularity or masses. ECOG PERFORMANCE STATUS: 0 - Asymptomatic  LABORATORY DATA: Lab Results  Component Value Date   WBC 16.4* 03/31/2012   HGB 10.3* 03/31/2012   HCT 32.3* 03/31/2012   MCV 96.2 03/31/2012   PLT 277 03/31/2012      Chemistry      Component Value Date/Time   NA 138 03/17/2012 1044   NA 131* 01/07/2012 1746   NA 141 12/17/2011 1144   K 3.6 03/17/2012 1044   K 4.3 01/07/2012 1746   CL 102 03/17/2012 1044   CL 96 01/07/2012 1746   CO2 21* 03/17/2012 1044   CO2 27 01/07/2012 1746   BUN 22.6 03/17/2012 1044   BUN 19 01/07/2012 1746   BUN 15 12/17/2011 1144   CREATININE 0.7 03/17/2012 1044   CREATININE 0.61 01/07/2012 1746      Component Value Date/Time   CALCIUM 8.8 03/17/2012 1044   CALCIUM 8.1* 01/07/2012 1746   ALKPHOS 107 03/17/2012 1044   ALKPHOS 64 01/07/2012 1746   AST 23 03/17/2012 1044   AST 16 01/07/2012 1746   ALT 35 03/17/2012 1044   ALT 11 01/07/2012 1746   BILITOT 0.27 03/17/2012 1044   BILITOT 0.1* 01/07/2012 1746       RADIOGRAPHIC STUDIES:  No results found.  ASSESSMENT: 67 year old female with:  #1 DCIS of the breast. She is on tamoxifen 20 mg daily. However I have recommended that we discontinue the tamoxifen for now a do to her recent diagnosis of ovarian cancer.  #2 recent diagnosis of gynecologic malignancy this is ovarian carcinoma). Patient  is currently receiving neoadjuvant chemotherapy  consisting of Taxol and carboplatinum. Overall she is tolerating it well. Recent CT performed on January 8 showed a minute response to chemotherapy. Total of 6 cycles of Taxol and carboplatinum are planned. After that she will be seen back by Dr. Grant Ruts for ovarian surgery.  #3 oral dental hygiene/thrush  #4 Neuropathy  PLAN:  #1 proceed with cycle #5 day 1 of Taxol and carboplatinum. She will return tomorrow for day 2 Neulasta.  #2 she will be seen back in one week's time for interim lab.  #3 after completion of 6 cycles of Taxol and carboplatinum she will have restaging studies and will be referred to Dr. Grant Ruts  All questions were answered. The patient knows to call the clinic with any problems, questions or concerns. We can certainly see the patient much sooner if necessary.  I spent 25 minutes counseling the patient face to face. The total time spent in the appointment was 30 minutes.  Drue Second, MD Medical/Oncology [facility] [phone] (beeper) [phone] (Office)  03/31/2012, 9:39 AM

## 2012-03-31 NOTE — Patient Instructions (Addendum)
Proceed with chemotherapy today   Return in 1 week for follow up

## 2012-03-31 NOTE — Patient Instructions (Addendum)
[facility] Discharge Instructions for Patients Receiving Chemotherapy  Today you received the following chemotherapy agents: Taxol, Carboplatin  To help prevent nausea and vomiting after your treatment, we encourage you to take your nausea medication as directed by your MD.  If you develop nausea and vomiting that is not controlled by your nausea medication, call the clinic. If it is after clinic hours your family physician or the after hours number for the clinic or go to the Emergency Department.   BELOW ARE SYMPTOMS THAT SHOULD BE REPORTED IMMEDIATELY:  *FEVER GREATER THAN 100.5 F  *CHILLS WITH OR WITHOUT FEVER  NAUSEA AND VOMITING THAT IS NOT CONTROLLED WITH YOUR NAUSEA MEDICATION  *UNUSUAL SHORTNESS OF BREATH  *UNUSUAL BRUISING OR BLEEDING  TENDERNESS IN MOUTH AND THROAT WITH OR WITHOUT PRESENCE OF ULCERS  *URINARY PROBLEMS  *BOWEL PROBLEMS  UNUSUAL RASH Items with * indicate a potential emergency and should be followed up as soon as possible.   Feel free to call the clinic you have any questions or concerns. The clinic phone number is [phone].    

## 2012-04-01 ENCOUNTER — Ambulatory Visit (HOSPITAL_BASED_OUTPATIENT_CLINIC_OR_DEPARTMENT_OTHER): Payer: BC Managed Care – PPO

## 2012-04-01 VITALS — BP 147/88 | HR 77 | Temp 97.9°F

## 2012-04-01 DIAGNOSIS — C569 Malignant neoplasm of unspecified ovary: Secondary | ICD-10-CM

## 2012-04-01 DIAGNOSIS — Z5189 Encounter for other specified aftercare: Secondary | ICD-10-CM

## 2012-04-01 DIAGNOSIS — D0590 Unspecified type of carcinoma in situ of unspecified breast: Secondary | ICD-10-CM

## 2012-04-01 MED ORDER — PEGFILGRASTIM INJECTION 6 MG/0.6ML
6.0000 mg | Freq: Once | SUBCUTANEOUS | Status: AC
  Administered 2012-04-01: 6 mg via SUBCUTANEOUS

## 2012-04-03 ENCOUNTER — Telehealth: Payer: Self-pay | Admitting: Cardiovascular Disease

## 2012-04-03 DIAGNOSIS — Z951 Presence of aortocoronary bypass graft: Secondary | ICD-10-CM

## 2012-04-03 DIAGNOSIS — Z01818 Encounter for other preprocedural examination: Secondary | ICD-10-CM

## 2012-04-03 DIAGNOSIS — I2510 Atherosclerotic heart disease of native coronary artery without angina pectoris: Secondary | ICD-10-CM

## 2012-04-03 NOTE — Telephone Encounter (Signed)
Order placed for echo will forward to [facility] to call pt and schedule. Staff note was sent.

## 2012-04-03 NOTE — Telephone Encounter (Signed)
Message copied by Antony Odea on Mon Apr 03, 2012  3:47 PM ------      Message from: Vesta Mixer      Created: Mon Apr 03, 2012  3:05 PM       [name] [name] needs an 2-D echocardiogram prior to her office visit for pre-op assessment.            Thanks.             ------

## 2012-04-03 NOTE — Telephone Encounter (Signed)
Dr Elease Hashimoto called pt

## 2012-04-03 NOTE — Telephone Encounter (Signed)
Dr Elease Hashimoto returned call

## 2012-04-03 NOTE — Telephone Encounter (Signed)
New Problem:    Patient returned Dr. Harvie Bridge phone call.  Please call back.

## 2012-04-06 ENCOUNTER — Other Ambulatory Visit: Payer: Self-pay

## 2012-04-06 ENCOUNTER — Encounter: Payer: Self-pay | Admitting: Surgery

## 2012-04-06 ENCOUNTER — Ambulatory Visit (INDEPENDENT_AMBULATORY_CARE_PROVIDER_SITE_OTHER): Payer: Medicare Other | Admitting: Surgery

## 2012-04-06 ENCOUNTER — Other Ambulatory Visit: Payer: Self-pay | Admitting: *Deleted

## 2012-04-06 ENCOUNTER — Ambulatory Visit
Admission: RE | Admit: 2012-04-06 | Discharge: 2012-04-06 | Disposition: A | Discharge status: Home / Self Care | Payer: BC Managed Care – PPO | Source: Ambulatory Visit | Attending: Surgery | Admitting: Surgery

## 2012-04-06 VITALS — BP 120/80 | HR 96 | Resp 18 | Ht 66.0 in | Wt 149.0 lb

## 2012-04-06 DIAGNOSIS — J910 Malignant pleural effusion: Secondary | ICD-10-CM

## 2012-04-06 DIAGNOSIS — J90 Pleural effusion, not elsewhere classified: Secondary | ICD-10-CM

## 2012-04-06 DIAGNOSIS — Z09 Encounter for follow-up examination after completed treatment for conditions other than malignant neoplasm: Secondary | ICD-10-CM

## 2012-04-06 NOTE — Progress Notes (Signed)
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 Home health nursing is now draining the catheters once weekly at home. The right Pleurx catheter has been draining about 25-50 cc per week. The left Pleurx catheter is draining about 200-300 cc per week. She just finished her fifth of six planned chemotherapy rounds. Apparently the plan is that she will undergo surgery following her chemotherapy. She denies any chest pain or shortness of breath.  Current Outpatient Prescriptions  Medication Sig Dispense Refill  . ALPRAZolam (XANAX) 0.25 MG tablet Take 1 tablet (0.25 mg total) by mouth every 8 (eight) hours as needed for anxiety.  30 tablet  0  . Alum & Mag Hydroxide-Simeth (MAGIC MOUTHWASH) SOLN Take 5 mLs by mouth 4 (four) times daily as needed.  240 mL  6  . aspirin 325 MG tablet Take 325 mg by mouth daily.      [name] atorvastatin (LIPITOR) 20 MG tablet Take 20 mg by mouth daily.      . B Complex-C (SUPER B COMPLEX PO) Take 1 tablet by mouth daily.      . carvedilol (COREG) 25 MG tablet Take 1 tablet (25 mg total) by mouth 2 (two) times daily.  180 tablet  3  . dexamethasone (DECADRON) 4 MG tablet Take 2 tablets (8 mg total) by mouth 2 (two) times daily with a meal. Take two times a day starting the day after chemotherapy for 3 days.  30 tablet  1  . dexamethasone (DECADRON) 4 MG tablet Take 1 by mouth daily  30 tablet  6  . Docusate Calcium (STOOL SOFTENER PO) Take 2 capsules by mouth every morning.      [name] EPINEPHrine (EPIPEN 2-PAK) 0.3 mg/0.3 mL DEVI Inject 0.3 mg into the muscle.       [name] HYDROcodone-acetaminophen (NORCO/VICODIN) 5-325 MG per tablet Take 1-2 tablets by mouth every 4 (four) hours as needed.  30 tablet  0  . LORazepam (ATIVAN) 0.5 MG tablet Take 1 tablet (0.5 mg  total) by mouth every 6 (six) hours as needed for anxiety (Nausea/Vomiting).  60 tablet  0  . metoprolol succinate (TOPROL-XL) 50 MG 24 hr tablet 25 mg Twice daily.      . nitroGLYCERIN (NITROSTAT) 0.4 MG SL tablet Place 0.4 mg under the tongue every 5 (five) minutes as needed. Chest pain      . nystatin (MYCOSTATIN) 100000 UNIT/ML suspension Take 5 mLs (500,000 Units total) by mouth 4 (four) times daily.  60 mL  6  . omeprazole (PRILOSEC) 20 MG capsule Take 1 capsule (20 mg total) by mouth daily.  30 capsule  6  . ondansetron (ZOFRAN) 8 MG tablet Take 1 tablet (8 mg total) by mouth 2 (two) times daily. Take two times a day starting the day after chemo for 3 days. Then take two times a day as needed for nausea or vomiting.  30 tablet  1  . oxybutynin (DITROPAN-XL) 10 MG 24 hr tablet Take 10 mg by mouth daily.      Bertram Gala Glycol-Propyl Glycol 0.4-0.3 % SOLN Place 1 drop into both eyes every morning.       . prochlorperazine (COMPAZINE) 10 MG tablet as needed.      [name]  prochlorperazine (COMPAZINE) 25 MG suppository as needed.      . SF 5000 PLUS 1.1 % CREA dental cream 1.1 %.      . trimethoprim (TRIMPEX) 100 MG tablet Take 100 mg by mouth daily.       No current facility-administered medications for this visit.   Facility-Administered Medications Ordered in Other Visits  Medication Dose Route Frequency Provider Last Rate Last Dose  . influenza  inactive virus vaccine (FLUZONE/FLUARIX) injection 0.5 mL  0.5 mL Intramuscular Once Joselyn Arrow, MD         Physical Exam:  BP 120/80  Pulse 96  Resp 18  Ht 5\' 6"  (1.676 m)  Wt 149 lb (67.586 kg)  BMI 24.06 kg/m2  SpO2 98% She is in good spirits. She has put on a lot of weight from steroids. Lung exam is clear. Cardiac exam shows a regular rate and rhythm with normal heart sounds.  Diagnostic Tests:  *RADIOLOGY REPORT*   Clinical Data: History of pleural effusion, history of ovarian and breast carcinoma, some shortness of breath     CHEST - 2 VIEW   Comparison: CT chest of 02/23/2012 and chest x-ray of 02/03/2012   Findings: As noted on recent CT, only tiny pleural effusions are present bilaterally.  No infiltrate is noted.  Mild cardiomegaly is stable.  A Port-A-Cath is present with the tip overlying the upper SVC.  Median sternotomy sutures are present from prior CABG.   IMPRESSION: Tiny pleural effusions.     Original Report Authenticated By: Dwyane Dee, M.D.   Impression:  She has minimal drainage from the right Pleurx catheter and I think it can be removed. The left Pleurx catheter was still draining 200-300 cc per week and I think it would be best to do a talc pleurodesis  on that side prior to removing the catheter. I discussed the procedure with her and her husband including alternatives, benefits, and risks including but not limited to bleeding, infection, fever, pleuritic chest pain, and recurrence of the pleural effusions. She understands and agrees to proceed.  Plan:  We will plan to remove her right Pleurx catheter and do a left talc pleural pieces next Wednesday, 04/12/2012.

## 2012-04-07 ENCOUNTER — Ambulatory Visit (HOSPITAL_BASED_OUTPATIENT_CLINIC_OR_DEPARTMENT_OTHER): Payer: BC Managed Care – PPO

## 2012-04-07 ENCOUNTER — Ambulatory Visit (HOSPITAL_BASED_OUTPATIENT_CLINIC_OR_DEPARTMENT_OTHER): Payer: Medicare Other | Admitting: Adult Health

## 2012-04-07 ENCOUNTER — Telehealth: Payer: Self-pay | Admitting: Oncology

## 2012-04-07 ENCOUNTER — Other Ambulatory Visit (HOSPITAL_BASED_OUTPATIENT_CLINIC_OR_DEPARTMENT_OTHER): Payer: BC Managed Care – PPO | Admitting: Lab

## 2012-04-07 ENCOUNTER — Encounter: Payer: Self-pay | Admitting: Adult Health

## 2012-04-07 VITALS — BP 107/68 | HR 85 | Temp 97.9°F | Resp 20 | Ht 66.0 in | Wt 156.5 lb

## 2012-04-07 DIAGNOSIS — D0590 Unspecified type of carcinoma in situ of unspecified breast: Secondary | ICD-10-CM

## 2012-04-07 DIAGNOSIS — G589 Mononeuropathy, unspecified: Secondary | ICD-10-CM

## 2012-04-07 DIAGNOSIS — E860 Dehydration: Secondary | ICD-10-CM

## 2012-04-07 LAB — COMPREHENSIVE METABOLIC PANEL (CC13)
ALT: 43 U/L (ref 0–55)
AST: 22 U/L (ref 5–34)
Alkaline Phosphatase: 91 U/L (ref 40–150)
BUN: 23.6 mg/dL (ref 7.0–26.0)
Chloride: 102 mEq/L (ref 98–107)
Creatinine: 0.7 mg/dL (ref 0.6–1.1)
Total Bilirubin: 0.32 mg/dL (ref 0.20–1.20)

## 2012-04-07 LAB — CBC WITH DIFFERENTIAL/PLATELET
BASO%: 0.2 % (ref 0.0–2.0)
EOS%: 0.1 % (ref 0.0–7.0)
HCT: 30.3 % — ABNORMAL LOW (ref 34.8–46.6)
LYMPH%: 10 % — ABNORMAL LOW (ref 14.0–49.7)
MCH: 30.7 pg (ref 25.1–34.0)
MCHC: 31.9 g/dL (ref 31.5–36.0)
MCV: 96.3 fL (ref 79.5–101.0)
MONO%: 4.5 % (ref 0.0–14.0)
NEUT%: 85.2 % — ABNORMAL HIGH (ref 38.4–76.8)
lymph#: 1.6 10*3/uL (ref 0.9–3.3)

## 2012-04-07 MED ORDER — SODIUM CHLORIDE 0.9 % IV SOLN
Freq: Once | INTRAVENOUS | Status: DC
  Administered 2012-04-07: 11:00:00 via INTRAVENOUS
  Filled 2012-04-07: qty 1000

## 2012-04-07 MED ORDER — SODIUM CHLORIDE 0.9 % IV SOLN: Freq: Once | INTRAVENOUS | Status: DC

## 2012-04-07 NOTE — Progress Notes (Signed)
1330-Pt complaining of pain to port site and down left arm.  Site swollen.  HN and dressing remain intact.  No blood return from [facility].  Resistance when attempting to flush port.  4 ml.'s of clear fluid removed via syringe.  Pt states that pain was instantly relieved when 78ml.'s removed from [facility] site.  Candida Peeling NP notified and order received to stop fluids.  Pt has had approximately 2 1/2 hours of fluids with KCL.  IR notified and appt set up for [facility] study on Thursday, February 27th at 1:30PM.  HN removed without difficulty.  Catheter intact.  Pt has no questions at this time.

## 2012-04-07 NOTE — Telephone Encounter (Signed)
CENTRALIZED  SCHEDULING DEPT WILL NOTIFY THE PT OF HER CT SCAN APPTS FOR MARCH.

## 2012-04-07 NOTE — Addendum Note (Signed)
Addended by: Augustin Schooling C on: 04/07/2012 02:12 PM   Modules accepted: Orders

## 2012-04-07 NOTE — Patient Instructions (Signed)
Doing well.  We will give you IV fluids today.  We will see you back on March 7.  Please call us if you have any questions or concerns.

## 2012-04-07 NOTE — Addendum Note (Signed)
Addended by: Augustin Schooling C on: 04/07/2012 10:30 AM   Modules accepted: Orders

## 2012-04-07 NOTE — Patient Instructions (Addendum)
Dehydration, Adult Dehydration means your body does not have as much fluid as it needs. Your kidneys, brain, and heart will not work properly without the right amount of fluids and salt.  HOME CARE  Ask your doctor how to replace body fluid losses (rehydrate).  Drink enough fluids to keep your pee (urine) clear or pale yellow.  Drink small amounts of fluids often if you feel sick to your stomach (nauseous) or throw up (vomit).  Eat like you normally do.  Avoid:  Foods or drinks high in sugar.  Bubbly (carbonated) drinks.  Juice.  Very hot or cold fluids.  Drinks with caffeine.  Fatty, greasy foods.  Alcohol.  Tobacco.  Eating too much.  Gelatin desserts.  Wash your hands to avoid spreading germs (bacteria, viruses).  Only take medicine as told by your doctor.  Keep all doctor visits as told. GET HELP RIGHT AWAY IF:   You cannot drink something without throwing up.  You get worse even with treatment.  Your vomit has blood in it or looks greenish.  Your poop (stool) has blood in it or looks black and tarry.  You have not peed in 6 to 8 hours.  You pee a small amount of very dark pee.  You have a fever.  You pass out (faint).  You have belly (abdominal) pain that gets worse or stays in one spot (localizes).  You have a rash, stiff neck, or bad headache.  You get easily annoyed, sleepy, or are hard to wake up.  You feel weak, dizzy, or very thirsty. MAKE SURE YOU:   Understand these instructions.  Will watch your condition.  Will get help right away if you are not doing well or get worse. Document Released: 11/28/2008 Document Revised: 04/26/2011 Document Reviewed: 09/21/2010 [facility] Patient Information 2013 [place], [place].   Appointment on Thursday, February 27th at 1:30PM in Interventional Radiology to assess port-a-cath.  Report directly to Radiology at 1:15PM.

## 2012-04-07 NOTE — Progress Notes (Signed)
OFFICE PROGRESS NOTE  CC KNAPP,[name], MD [address] [place] [address] Dr. Emelia Loron Dr. 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  PRIOR THERAPY: #1. S/P left breast lumpectomy in March 2011 after she had a screen detected 1.0 cm ER+, PR+, intermediate grade DCIS. Patient had 3 sentinel biopsied all of them were negative for metastatic disease. Patient underwent radiation therapy between 06/05/2009 through 07/02/2009. She was then begun on tamoxifen 20 mg daily in August 2011.  #2 new diagnosis of gynecologic malignancy presenting with abdominal mass and pleural effusions. Patient was recently hospitalized with shortness of breath she was discovered to have malignant pleural effusion she is status post Pleurx catheter is. She also had malignant ascites she's had several paracentesis procedures performed during her hospitalization. During her hospitalization she was seen by gynecologic oncology.  #3 patient began neoadjuvant chemotherapy consisting of Taxol and carboplatinum. Her first cycle was administered during her hospitalization. Total of 6 cycles of Taxol and carboplatinum are planned. She has also been seen by gynecologic oncology. She will see them again at completion of 6 cycles. We will also plan on doing restaging scans at that time.   CURRENT THERAPY: Cycle 5 day 8 of Taxol and carboplatinum with Neulasta support  INTERVAL HISTORY: [name] [name] 67 y.o. female returns for follow up visit.  [name] doing well.   She continues to have fatigue.  She feels dehydrated today, though she isn't experiencing any dizziness, she doesn't feel she's been keeping up with her fluids as good as she should have.  She is encouraged that she did not experience as much bone pain after the Neulasta.  Her numbness continues to be intermittent and she has retained her motor function, and is no worse.  [name]  still doing embroidery. She saw Dr. Laneta Simmers who placed her pleurx catheters.  He did a chest x ray and is going to remove the right pleurx catheter and pleurodese the left lung.  Otherwise she's doing well and a 10 point ROS is neg.   MEDICAL HISTORY: Past Medical History  Diagnosis Date  . Interstitial cystitis     on chronic antibiotics  . Hyperlipidemia   . Frequent UTI     on prophylaxis  . 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 CAD (coronary artery disease)     a. s/p CABG 7/13;   b.  LHC 12/01/11:  pLAD 70%, mLAD 40%, CFX 40-50% prior to takeoff of the OM2, oRCA occluded, mid vessel filled via R->R collaterals and distal vessel filled by L->R collaterals, S-OM1/OM2 (small and diffusely dz) with mid 90% stenosis, 90% at OM1 anastomotic site, continuation of OM2 occluded, S-Dx patent, S-PDA occluded, L-LAD ok, EF 55-65%  => Med Rx rec.  [name] Hx of echocardiogram     Echo 5/13: EF 60-65%, grade 2 diastolic dysfunction  . Hypercholesterolemia   . Pleural effusion 12/28/11    s/p pleurx catheter  . GERD (gastroesophageal reflux disease)   . Arthritis     "just a little; lower back" (12/28/11)  . Gout attack 08/2011    related to "stress post OHS"  . Depression   . Breast cancer     "left"; on Tamoxifen  . S/P thoracentesis 12/28/11    "for pleural effusion" (12/28/2011)  . Ovarian cancer   . Tachycardia     ALLERGIES:  is allergic to codeine; isosorbide; other; phenothiazines; talwin; and yellow jacket venom.  MEDICATIONS:  Current Outpatient Prescriptions  Medication Sig Dispense Refill  . ALPRAZolam (XANAX) 0.25 MG tablet Take 1 tablet (0.25 mg total) by mouth every 8 (eight) hours as needed for anxiety.  30 tablet  0  . Alum & Mag Hydroxide-Simeth (MAGIC MOUTHWASH) SOLN Take 5 mLs by mouth 4 (four) times daily as needed.  240 mL  6  . aspirin 325 MG tablet Take 325 mg by mouth daily.      [name] atorvastatin (LIPITOR) 20 MG tablet Take 20 mg by mouth daily.      . B Complex-C (SUPER B  COMPLEX PO) Take 1 tablet by mouth daily.      . carvedilol (COREG) 25 MG tablet Take 1 tablet (25 mg total) by mouth 2 (two) times daily.  180 tablet  3  . dexamethasone (DECADRON) 4 MG tablet Take 2 tablets (8 mg total) by mouth 2 (two) times daily with a meal. Take two times a day starting the day after chemotherapy for 3 days.  30 tablet  1  . dexamethasone (DECADRON) 4 MG tablet Take 1 by mouth daily  30 tablet  6  . Docusate Calcium (STOOL SOFTENER PO) Take 2 capsules by mouth every morning.      [name] EPINEPHrine (EPIPEN 2-PAK) 0.3 mg/0.3 mL DEVI Inject 0.3 mg into the muscle.       [name] HYDROcodone-acetaminophen (NORCO/VICODIN) 5-325 MG per tablet Take 1-2 tablets by mouth every 4 (four) hours as needed.  30 tablet  0  . LORazepam (ATIVAN) 0.5 MG tablet Take 1 tablet (0.5 mg total) by mouth every 6 (six) hours as needed for anxiety (Nausea/Vomiting).  60 tablet  0  . metoprolol succinate (TOPROL-XL) 50 MG 24 hr tablet 25 mg Twice daily.      . nitroGLYCERIN (NITROSTAT) 0.4 MG SL tablet Place 0.4 mg under the tongue every 5 (five) minutes as needed. Chest pain      . nystatin (MYCOSTATIN) 100000 UNIT/ML suspension Take 5 mLs (500,000 Units total) by mouth 4 (four) times daily.  60 mL  6  . omeprazole (PRILOSEC) 20 MG capsule Take 1 capsule (20 mg total) by mouth daily.  30 capsule  6  . ondansetron (ZOFRAN) 8 MG tablet Take 1 tablet (8 mg total) by mouth 2 (two) times daily. Take two times a day starting the day after chemo for 3 days. Then take two times a day as needed for nausea or vomiting.  30 tablet  1  . oxybutynin (DITROPAN-XL) 10 MG 24 hr tablet Take 10 mg by mouth daily.      Bertram Gala Glycol-Propyl Glycol 0.4-0.3 % SOLN Place 1 drop into both eyes every morning.       . prochlorperazine (COMPAZINE) 10 MG tablet as needed.      . prochlorperazine (COMPAZINE) 25 MG suppository as needed.      . SF 5000 PLUS 1.1 % CREA dental cream 1.1 %.      . trimethoprim (TRIMPEX) 100 MG tablet Take  100 mg by mouth daily.       Current Facility-Administered Medications  Medication Dose Route Frequency Provider Last Rate Last Dose  . 0.9 %  sodium chloride infusion   Intravenous Once Augustin Schooling, NP       Facility-Administered Medications Ordered in Other Visits  Medication Dose Route Frequency Provider Last Rate Last Dose  . influenza  inactive virus vaccine (FLUZONE/FLUARIX) injection 0.5 mL  0.5 mL Intramuscular Once Joselyn Arrow, MD  SURGICAL HISTORY:  Past Surgical History  Procedure Laterality Date  . Breast lumpectomy  04/2009    left  . Cesarean section  1973; 1976  . Muscle release  1960    L neck and chest.; "when I was 13; pneumonia settled in my left neck"  . Coronary artery bypass graft  08/18/2011    Procedure: CORONARY ARTERY BYPASS GRAFTING (CABG);  Surgeon: Alleen Borne, MD;  Location: [facility] OR;  Service: Open Heart Surgery;  Laterality: N/A;  Coronary Artery Bypass Graft times five utilizing the left internal mammary artery and the left greater saphenous vein harvested endoscopically.  . Abdominal hysterectomy  1976  . Appendectomy  1976  . Portacath placement  01/06/2012    Procedure: INSERTION PORT-A-CATH;  Surgeon: Alleen Borne, MD;  Location: [facility] OR;  Service: Thoracic;  Laterality: Left;  . Chest tube insertion  01/06/2012    Procedure: INSERTION PLEURAL DRAINAGE CATHETER;  Surgeon: Alleen Borne, MD;  Location: [facility] OR;  Service: Thoracic;  Laterality: Bilateral;    REVIEW OF SYSTEMS:   General: fatigue (+), night sweats (-), fever (-), pain (-) Lymph: palpable nodes (-) HEENT: vision changes (-), mucositis (-), gum bleeding (-), epistaxis (-) Cardiovascular: chest pain (-), palpitations (-) Pulmonary: shortness of breath (-), dyspnea on exertion (-), cough (-), hemoptysis (-) GI:  Early satiety (-), melena (-), dysphagia (-), nausea/vomiting (-), diarrhea (-) GU: dysuria (-), hematuria (-), incontinence (-) Musculoskeletal: joint swelling (-),  joint pain (-), back pain (-) Neuro: weakness (-), numbness (+), headache (-), confusion (-) Skin: Rash (-), lesions (-), dryness (-) Psych: depression (-), suicidal/homicidal ideation (-), feeling of hopelessness (-)   PHYSICAL EXAMINATION:  BP 107/68  Pulse 85  Temp(Src) 97.9 F (36.6 C) (Oral)  Resp 20  Ht 5\' 6"  (1.676 m)  Wt 156 lb 8 oz (70.988 kg)  BMI 25.27 kg/m2 General: Patient is an ill appearing female in no acute distress HEENT: PERRLA, sclerae anicteric no conjunctival pallor, MMM Neck: supple, no palpable adenopathy Lungs: clear to auscultation bilaterally, no wheezes, rhonchi, or rales Cardiovascular: regular rate rhythm, S1, S2, no murmurs, rubs or gallops Abdomen: Soft, non-tender, non-distended, normoactive bowel sounds, no HSM, pleurx dressings clean dry and intact Extremities: warm and well perfused, no clubbing, cyanosis, or edema Skin: No rashes or lesions Neuro: Non-focal ECOG PERFORMANCE STATUS: 0 - Asymptomatic  LABORATORY DATA: Lab Results  Component Value Date   WBC 16.2* 04/07/2012   HGB 9.7* 04/07/2012   HCT 30.3* 04/07/2012   MCV 96.3 04/07/2012   PLT 259 04/07/2012      Chemistry      Component Value Date/Time   NA 136 04/07/2012 0815   NA 131* 01/07/2012 1746   NA 141 12/17/2011 1144   K 3.3* 04/07/2012 0815   K 4.3 01/07/2012 1746   CL 102 04/07/2012 0815   CL 96 01/07/2012 1746   CO2 23 04/07/2012 0815   CO2 27 01/07/2012 1746   BUN 23.6 04/07/2012 0815   BUN 19 01/07/2012 1746   BUN 15 12/17/2011 1144   CREATININE 0.7 04/07/2012 0815   CREATININE 0.61 01/07/2012 1746      Component Value Date/Time   CALCIUM 8.6 04/07/2012 0815   CALCIUM 8.1* 01/07/2012 1746   ALKPHOS 91 04/07/2012 0815   ALKPHOS 64 01/07/2012 1746   AST 22 04/07/2012 0815   AST 16 01/07/2012 1746   ALT 43 04/07/2012 0815   ALT 11 01/07/2012 1746   BILITOT 0.32 04/07/2012 0815  BILITOT 0.1* 01/07/2012 1746       RADIOGRAPHIC STUDIES:  No results  found.  ASSESSMENT: 67 year old female with:  #1 DCIS of the breast. She is on tamoxifen 20 mg daily. However I have recommended that we discontinue the tamoxifen for now a do to her recent diagnosis of ovarian cancer.  #2 recent diagnosis of gynecologic malignancy this is ovarian carcinoma). Patient is currently receiving neoadjuvant chemotherapy consisting of Taxol and carboplatinum. Overall she is tolerating it well. Recent CT performed on January 8 showed a minute response to chemotherapy. Total of 6 cycles of Taxol and carboplatinum are planned. After that she will be seen back by Dr. Grant Ruts for ovarian surgery.  #3 oral dental hygiene/thrush  #4 Neuropathy  PLAN:  #1 Ms. Clingan is doing well after chemotherapy.  I will give her IV fluids for her dehydration.    #2 she will be seen back on 3/7 for her last cycle of chemotherapy.    #3 after completion of 6 cycles of Taxol and carboplatinum she will have restaging studies and will be referred to Dr. Grant Ruts  All questions were answered. The patient knows to call the clinic with any problems, questions or concerns. We can certainly see the patient much sooner if necessary.  I spent 25 minutes counseling the patient face to face. The total time spent in the appointment was 30 minutes.  Cherie Ouch Lyn Hollingshead, NP Medical Oncology [facility] Phone: [phone] 04/07/2012, 9:45 AM

## 2012-04-10 ENCOUNTER — Other Ambulatory Visit: Payer: Self-pay | Admitting: *Deleted

## 2012-04-10 MED ORDER — NYSTATIN 100000 UNIT/ML MT SUSP: 500000.0000 [IU] | Freq: Four times a day (QID) | OROMUCOSAL | Status: DC

## 2012-04-11 ENCOUNTER — Encounter (HOSPITAL_COMMUNITY): Payer: Self-pay | Admitting: Pharmacy Technician

## 2012-04-12 ENCOUNTER — Telehealth: Payer: Self-pay | Admitting: Medical Oncology

## 2012-04-12 ENCOUNTER — Encounter (HOSPITAL_COMMUNITY)
Admission: RE | Disposition: A | Discharge status: Home / Self Care | Payer: Self-pay | Source: Ambulatory Visit | Attending: Surgery

## 2012-04-12 ENCOUNTER — Ambulatory Visit (HOSPITAL_COMMUNITY)
Admission: RE | Admit: 2012-04-12 | Discharge: 2012-04-12 | Disposition: A | Discharge status: Home / Self Care | Payer: BC Managed Care – PPO | Source: Ambulatory Visit | Attending: Surgery | Admitting: Surgery

## 2012-04-12 DIAGNOSIS — C569 Malignant neoplasm of unspecified ovary: Secondary | ICD-10-CM | POA: Insufficient documentation

## 2012-04-12 DIAGNOSIS — J90 Pleural effusion, not elsewhere classified: Secondary | ICD-10-CM

## 2012-04-12 DIAGNOSIS — J910 Malignant pleural effusion: Secondary | ICD-10-CM | POA: Insufficient documentation

## 2012-04-12 HISTORY — PX: TALC PLEURODESIS: SHX2506

## 2012-04-12 HISTORY — PX: REMOVAL OF PLEURAL DRAINAGE CATHETER: SHX5080

## 2012-04-12 SURGERY — MINOR REMOVAL OF PLEURAL DRAINAGE CATHETER
Anesthesia: LOCAL | Laterality: Right

## 2012-04-12 MED ORDER — HYDROCODONE-ACETAMINOPHEN 10-325 MG PO TABS
1.0000 | ORAL_TABLET | Freq: Once | ORAL | Status: AC
  Administered 2012-04-12: 1 via ORAL
  Filled 2012-04-12: qty 1

## 2012-04-12 MED ORDER — SODIUM CHLORIDE 0.9 % IV SOLN
3.0000 g | Freq: Once | INTRAVENOUS | Status: DC
  Filled 2012-04-12: qty 3

## 2012-04-12 NOTE — Progress Notes (Signed)
Pt instructed by Laqueta Due, PA-C and Donnelle ,PA-C. Post procedure and care at home. Pt denies any questions.Dr. Laneta Simmers by to see patient and discuss port-a-cath after patient states that it is not working properly. Dr. Laneta Simmers discussing options with Dr. Laneta Simmers.

## 2012-04-12 NOTE — Telephone Encounter (Signed)
Patient called to report that Dr. Laneta Simmers agreed to place a new PAC since her current one "has failed" and that she has cancelled the dye study scheduled for tomorrow. Will forward mssg to MD/NP. 03/07 sched apt with Lab/NP/Tx. New port to be placed prior to next chemo treatment, per patient.

## 2012-04-12 NOTE — Progress Notes (Signed)
Pt tolerated procedure well. Denies complaints. Pain is better after administering hydrocodone. 4x4 gauze occlysive dressing to left pleurex catheter site applied in Eric Barrett and 2x2 occlusive dressing applied to right pleurex cath removal site. Sites unremarkable.

## 2012-04-13 ENCOUNTER — Other Ambulatory Visit: Payer: Self-pay | Admitting: *Deleted

## 2012-04-13 ENCOUNTER — Encounter (HOSPITAL_COMMUNITY): Payer: Self-pay | Admitting: Pharmacy Technician

## 2012-04-13 ENCOUNTER — Encounter (HOSPITAL_COMMUNITY): Payer: Self-pay | Admitting: Surgery

## 2012-04-13 ENCOUNTER — Ambulatory Visit (HOSPITAL_COMMUNITY): Payer: BC Managed Care – PPO

## 2012-04-13 DIAGNOSIS — G8918 Other acute postprocedural pain: Secondary | ICD-10-CM

## 2012-04-13 MED ORDER — HYDROCODONE-ACETAMINOPHEN 5-325 MG PO TABS: 1.0000 | ORAL_TABLET | ORAL | Status: DC | PRN

## 2012-04-13 NOTE — Telephone Encounter (Signed)
ok 

## 2012-04-14 ENCOUNTER — Encounter: Payer: Self-pay | Admitting: Cardiovascular Disease

## 2012-04-14 ENCOUNTER — Ambulatory Visit (HOSPITAL_COMMUNITY): Payer: BC Managed Care – PPO | Attending: Internal Medicine | Admitting: Radiology

## 2012-04-14 DIAGNOSIS — Z01818 Encounter for other preprocedural examination: Secondary | ICD-10-CM | POA: Insufficient documentation

## 2012-04-14 DIAGNOSIS — I2510 Atherosclerotic heart disease of native coronary artery without angina pectoris: Secondary | ICD-10-CM | POA: Insufficient documentation

## 2012-04-14 NOTE — Progress Notes (Signed)
Echocardiogram performed.  

## 2012-04-14 NOTE — Progress Notes (Signed)
[name] [name] has normal LV function by echo today.  She should be at low / moderate risk for her upcoming GYN surgery.    Vesta Mixer, Montez Hageman., MD, [facility] 04/14/2012, 5:16 PM Office - [phone] Pager [phone]

## 2012-04-16 MED ORDER — DEXTROSE 5 % IV SOLN
1.5000 g | INTRAVENOUS | Status: AC
  Administered 2012-04-17: 1.5 g via INTRAVENOUS
  Filled 2012-04-16: qty 1.5

## 2012-04-17 ENCOUNTER — Ambulatory Visit (HOSPITAL_COMMUNITY)
Admission: RE | Admit: 2012-04-17 | Discharge: 2012-04-17 | Disposition: A | Discharge status: Home / Self Care | Payer: BC Managed Care – PPO | Source: Ambulatory Visit | Attending: Surgery | Admitting: Surgery

## 2012-04-17 ENCOUNTER — Ambulatory Visit (HOSPITAL_COMMUNITY): Payer: BC Managed Care – PPO | Admitting: Certified Registered"

## 2012-04-17 ENCOUNTER — Encounter (HOSPITAL_COMMUNITY): Payer: Self-pay | Admitting: Certified Registered"

## 2012-04-17 ENCOUNTER — Ambulatory Visit (HOSPITAL_COMMUNITY): Payer: BC Managed Care – PPO

## 2012-04-17 ENCOUNTER — Encounter (HOSPITAL_COMMUNITY)
Admission: RE | Disposition: A | Discharge status: Home / Self Care | Payer: Self-pay | Source: Ambulatory Visit | Attending: Surgery

## 2012-04-17 ENCOUNTER — Encounter (HOSPITAL_COMMUNITY): Payer: Self-pay | Admitting: *Deleted

## 2012-04-17 DIAGNOSIS — Y849 Medical procedure, unspecified as the cause of abnormal reaction of the patient, or of later complication, without mention of misadventure at the time of the procedure: Secondary | ICD-10-CM | POA: Insufficient documentation

## 2012-04-17 DIAGNOSIS — Z87440 Personal history of urinary (tract) infections: Secondary | ICD-10-CM | POA: Insufficient documentation

## 2012-04-17 DIAGNOSIS — Z853 Personal history of malignant neoplasm of breast: Secondary | ICD-10-CM | POA: Insufficient documentation

## 2012-04-17 DIAGNOSIS — Z9221 Personal history of antineoplastic chemotherapy: Secondary | ICD-10-CM | POA: Insufficient documentation

## 2012-04-17 DIAGNOSIS — E785 Hyperlipidemia, unspecified: Secondary | ICD-10-CM | POA: Insufficient documentation

## 2012-04-17 DIAGNOSIS — F3289 Other specified depressive episodes: Secondary | ICD-10-CM | POA: Insufficient documentation

## 2012-04-17 DIAGNOSIS — I2510 Atherosclerotic heart disease of native coronary artery without angina pectoris: Secondary | ICD-10-CM | POA: Insufficient documentation

## 2012-04-17 DIAGNOSIS — Z91038 Other insect allergy status: Secondary | ICD-10-CM | POA: Insufficient documentation

## 2012-04-17 DIAGNOSIS — F411 Generalized anxiety disorder: Secondary | ICD-10-CM | POA: Insufficient documentation

## 2012-04-17 DIAGNOSIS — Z79899 Other long term (current) drug therapy: Secondary | ICD-10-CM | POA: Insufficient documentation

## 2012-04-17 DIAGNOSIS — Z803 Family history of malignant neoplasm of breast: Secondary | ICD-10-CM | POA: Insufficient documentation

## 2012-04-17 DIAGNOSIS — Z90710 Acquired absence of both cervix and uterus: Secondary | ICD-10-CM | POA: Insufficient documentation

## 2012-04-17 DIAGNOSIS — IMO0002 Reserved for concepts with insufficient information to code with codable children: Secondary | ICD-10-CM | POA: Insufficient documentation

## 2012-04-17 DIAGNOSIS — Z7982 Long term (current) use of aspirin: Secondary | ICD-10-CM | POA: Insufficient documentation

## 2012-04-17 DIAGNOSIS — T82898A Other specified complication of vascular prosthetic devices, implants and grafts, initial encounter: Secondary | ICD-10-CM | POA: Insufficient documentation

## 2012-04-17 DIAGNOSIS — K219 Gastro-esophageal reflux disease without esophagitis: Secondary | ICD-10-CM | POA: Insufficient documentation

## 2012-04-17 DIAGNOSIS — J910 Malignant pleural effusion: Secondary | ICD-10-CM

## 2012-04-17 DIAGNOSIS — N3090 Cystitis, unspecified without hematuria: Secondary | ICD-10-CM | POA: Insufficient documentation

## 2012-04-17 DIAGNOSIS — I10 Essential (primary) hypertension: Secondary | ICD-10-CM | POA: Insufficient documentation

## 2012-04-17 DIAGNOSIS — C569 Malignant neoplasm of unspecified ovary: Secondary | ICD-10-CM | POA: Insufficient documentation

## 2012-04-17 DIAGNOSIS — Z923 Personal history of irradiation: Secondary | ICD-10-CM | POA: Insufficient documentation

## 2012-04-17 DIAGNOSIS — T82598A Other mechanical complication of other cardiac and vascular devices and implants, initial encounter: Secondary | ICD-10-CM

## 2012-04-17 DIAGNOSIS — C50919 Malignant neoplasm of unspecified site of unspecified female breast: Secondary | ICD-10-CM

## 2012-04-17 HISTORY — PX: REMOVAL OF PLEURAL DRAINAGE CATHETER: SHX5080

## 2012-04-17 HISTORY — PX: PORT-A-CATH REMOVAL: SHX5289

## 2012-04-17 HISTORY — PX: PORTACATH PLACEMENT: SHX2246

## 2012-04-17 LAB — CBC
HCT: 29.4 % — ABNORMAL LOW (ref 36.0–46.0)
Hemoglobin: 9.5 g/dL — ABNORMAL LOW (ref 12.0–15.0)
MCHC: 32.3 g/dL (ref 30.0–36.0)
MCV: 100.7 fL — ABNORMAL HIGH (ref 78.0–100.0)

## 2012-04-17 LAB — COMPREHENSIVE METABOLIC PANEL
Alkaline Phosphatase: 78 U/L (ref 39–117)
BUN: 18 mg/dL (ref 6–23)
Creatinine, Ser: 0.56 mg/dL (ref 0.50–1.10)
GFR calc Af Amer: >90 mL/min (ref >90)
Glucose, Bld: 93 mg/dL (ref 70–99)
Potassium: 3.6 mEq/L (ref 3.5–5.1)
Total Bilirubin: 0.3 mg/dL (ref 0.3–1.2)
Total Protein: 6.2 g/dL (ref 6.0–8.3)

## 2012-04-17 LAB — APTT: aPTT: 26 seconds (ref 24–37)

## 2012-04-17 LAB — SURGICAL PCR SCREEN: Staphylococcus aureus: NEGATIVE

## 2012-04-17 LAB — PROTIME-INR
INR: 0.95 (ref 0.00–1.49)
Prothrombin Time: 12.6 seconds (ref 11.6–15.2)

## 2012-04-17 SURGERY — INSERTION, TUNNELED CENTRAL VENOUS DEVICE, WITH PORT
Anesthesia: Monitor Anesthesia Care | Laterality: Left | Wound class: Clean

## 2012-04-17 MED ORDER — HEPARIN SODIUM (PORCINE) 1000 UNIT/ML IJ SOLN
INTRAMUSCULAR | Status: AC
  Filled 2012-04-17: qty 1

## 2012-04-17 MED ORDER — MIDAZOLAM HCL 5 MG/5ML IJ SOLN
INTRAMUSCULAR | Status: DC | PRN
  Administered 2012-04-17: 2 mg via INTRAVENOUS

## 2012-04-17 MED ORDER — MUPIROCIN 2 % EX OINT
TOPICAL_OINTMENT | CUTANEOUS | Status: AC
  Administered 2012-04-17: 1
  Filled 2012-04-17: qty 22

## 2012-04-17 MED ORDER — OXYCODONE HCL 5 MG PO TABS: 5.0000 mg | ORAL_TABLET | Freq: Once | ORAL | Status: DC | PRN

## 2012-04-17 MED ORDER — MEPERIDINE HCL 25 MG/ML IJ SOLN: 6.2500 mg | INTRAMUSCULAR | Status: DC | PRN

## 2012-04-17 MED ORDER — FENTANYL CITRATE 0.05 MG/ML IJ SOLN
INTRAMUSCULAR | Status: DC | PRN
  Administered 2012-04-17 (×4): 25 ug via INTRAVENOUS

## 2012-04-17 MED ORDER — LIDOCAINE HCL (CARDIAC) 20 MG/ML IV SOLN
INTRAVENOUS | Status: DC | PRN
  Administered 2012-04-17: 50 mg via INTRAVENOUS

## 2012-04-17 MED ORDER — ONDANSETRON HCL 4 MG/2ML IJ SOLN
INTRAMUSCULAR | Status: DC | PRN
  Administered 2012-04-17: 4 mg via INTRAVENOUS

## 2012-04-17 MED ORDER — HYDROMORPHONE HCL PF 1 MG/ML IJ SOLN: 0.2500 mg | INTRAMUSCULAR | Status: DC | PRN

## 2012-04-17 MED ORDER — HEPARIN SODIUM (PORCINE) 1000 UNIT/ML DIALYSIS
INTRAMUSCULAR | Status: DC | PRN
  Administered 2012-04-17: 8000 [IU]

## 2012-04-17 MED ORDER — OXYCODONE HCL 5 MG/5ML PO SOLN: 5.0000 mg | Freq: Once | ORAL | Status: DC | PRN

## 2012-04-17 MED ORDER — SODIUM CHLORIDE 0.9 % IV SOLN
INTRAVENOUS | Status: DC | PRN
  Administered 2012-04-17: 08:00:00 via INTRAVENOUS

## 2012-04-17 MED ORDER — PROPOFOL INFUSION 10 MG/ML OPTIME
INTRAVENOUS | Status: DC | PRN
  Administered 2012-04-17: 100 ug/kg/min via INTRAVENOUS

## 2012-04-17 MED ORDER — LIDOCAINE-EPINEPHRINE 1 %-1:100000 IJ SOLN
INTRAMUSCULAR | Status: AC
  Filled 2012-04-17: qty 1

## 2012-04-17 MED ORDER — LIDOCAINE-EPINEPHRINE (PF) 1 %-1:200000 IJ SOLN
INTRAMUSCULAR | Status: DC | PRN
  Administered 2012-04-17: 2 mL

## 2012-04-17 MED ORDER — SODIUM CHLORIDE 0.9 % IR SOLN
Status: DC | PRN
  Administered 2012-04-17: 08:00:00

## 2012-04-17 MED ORDER — ONDANSETRON HCL 4 MG/2ML IJ SOLN: 4.0000 mg | Freq: Once | INTRAMUSCULAR | Status: DC | PRN

## 2012-04-17 SURGICAL SUPPLY — 40 items
ADH SKN CLS APL DERMABOND .7 (GAUZE/BANDAGES/DRESSINGS) ×1
BAG DECANTER FOR FLEXI CONT (MISCELLANEOUS) ×2 IMPLANT
BLADE SURG 11 STRL SS (BLADE) ×1 IMPLANT
BLADE SURG 15 STRL LF DISP TIS (BLADE) ×1 IMPLANT
BLADE SURG 15 STRL SS (BLADE)
CANISTER SUCTION 2500CC (MISCELLANEOUS) ×2 IMPLANT
CLOTH BEACON ORANGE TIMEOUT ST (SAFETY) ×2 IMPLANT
COVER SURGICAL LIGHT HANDLE (MISCELLANEOUS) ×3 IMPLANT
DERMABOND ADVANCED (GAUZE/BANDAGES/DRESSINGS) ×1
DERMABOND ADVANCED .7 DNX12 (GAUZE/BANDAGES/DRESSINGS) ×1 IMPLANT
DRAPE C-ARM 42X72 X-RAY (DRAPES) ×2 IMPLANT
DRAPE CHEST BREAST 15X10 FENES (DRAPES) ×2 IMPLANT
DRAPE INCISE IOBAN 66X45 STRL (DRAPES) ×1 IMPLANT
DRAPE LAPAROTOMY T 102X78X121 (DRAPES) ×1 IMPLANT
ELECT CAUTERY BLADE 6.4 (BLADE) ×1 IMPLANT
ELECT REM PT RETURN 9FT ADLT (ELECTROSURGICAL) ×2
ELECTRODE REM PT RTRN 9FT ADLT (ELECTROSURGICAL) ×1 IMPLANT
GLOVE EUDERMIC 7 POWDERFREE (GLOVE) ×2 IMPLANT
GOWN PREVENTION PLUS XLARGE (GOWN DISPOSABLE) ×2 IMPLANT
GOWN STRL NON-REIN LRG LVL3 (GOWN DISPOSABLE) ×2 IMPLANT
KIT BASIN OR (CUSTOM PROCEDURE TRAY) ×2 IMPLANT
KIT PORT POWER 9.6FR MRI PREA (Catheter) IMPLANT
KIT PORT POWER ISP 8FR (Catheter) IMPLANT
KIT POWER CATH 8FR (Catheter) IMPLANT
KIT ROOM TURNOVER OR (KITS) ×2 IMPLANT
NEEDLE 22X1 1/2 (OR ONLY) (NEEDLE) ×2 IMPLANT
NS IRRIG 1000ML POUR BTL (IV SOLUTION) ×2 IMPLANT
PACK GENERAL/GYN (CUSTOM PROCEDURE TRAY) ×2 IMPLANT
PAD ARMBOARD 7.5X6 YLW CONV (MISCELLANEOUS) ×4 IMPLANT
SPONGE GAUZE 4X4 12PLY (GAUZE/BANDAGES/DRESSINGS) ×2 IMPLANT
SUT SILK 2 0 SH (SUTURE) ×4 IMPLANT
SUT VIC AB 3-0 SH 27 (SUTURE) ×2
SUT VIC AB 3-0 SH 27X BRD (SUTURE) ×1 IMPLANT
SUT VIC AB 4-0 PS2 27 (SUTURE) ×2 IMPLANT
SYR 20CC LL (SYRINGE) ×2 IMPLANT
SYR CONTROL 10ML LL (SYRINGE) ×2 IMPLANT
TAPE CLOTH SURG 4X10 WHT LF (GAUZE/BANDAGES/DRESSINGS) ×1 IMPLANT
TOWEL OR 17X24 6PK STRL BLUE (TOWEL DISPOSABLE) ×2 IMPLANT
TOWEL OR 17X26 10 PK STRL BLUE (TOWEL DISPOSABLE) ×2 IMPLANT
WATER STERILE IRR 1000ML POUR (IV SOLUTION) ×2 IMPLANT

## 2012-04-17 NOTE — Anesthesia Procedure Notes (Signed)
Procedure Name: MAC Date/Time: 04/17/2012 7:50 AM Performed by: Ellin Goodie Pre-anesthesia Checklist: Patient identified, Emergency Drugs available, Suction available, Patient being monitored and Timeout performed Patient Re-evaluated:Patient Re-evaluated prior to inductionOxygen Delivery Method: Simple face mask Preoxygenation: Pre-oxygenation with 100% oxygen Intubation Type: IV induction Placement Confirmation: positive ETCO2 Dental Injury: Teeth and Oropharynx as per pre-operative assessment

## 2012-04-17 NOTE — H&P (Signed)
[address]       [name] [address]             [phone]      HPI:  The patient returns today for insertion of new portacath and possible removal of left Pleurx catheter. The portacath has been unreliable to access. She had talc pleurodesis last week via the Pleurx.  Past Medical History   Diagnosis  Date   .  Breast cancer    .  Interstitial cystitis    .  Hyperlipidemia    .  Frequent UTI      on prophylaxis   .  Allergy to yellow jackets      Ovarian Cancer with peritoneal mets: currently undergoing chemotherapy.  Past Surgical History   Procedure  Date   .  Breast lumpectomy: followed by XRT and Tamoxifen (Left Breast)  04/2009     left   .  Partial hysterectomy  1976     vaginal bleeding after 2nd child born   .  Appendectomy  1976     CABG 08/18/11  Family History   Problem  Relation  Age of Onset   .  Cancer  Mother  35      breast cancer    .  Heart disease  Father  23      MI at 59, CABG in 27's    .  Hepatitis  Father       C from blood transfusion    .  Heart disease  Brother       CABG in 23's    .  Heart disease  Paternal Aunt    .  Heart disease  Paternal Uncle    .  Heart disease  Paternal Grandfather    .  Diabetes  Neg Hx    Social History  History   Substance Use Topics   .  Smoking status:  Former Smoker     Quit date:  02/15/2001   .  Smokeless tobacco:  Never Used   .  Alcohol Use:  Yes      1 glass of wine per day.     Current Outpatient Prescriptions   Medication  Sig  Dispense  Refill   .  ALPRAZolam (XANAX) 0.25 MG tablet  Take 1 tablet (0.25 mg total) by mouth every 8 (eight) hours as needed for anxiety.  30 tablet  0   .  Alum & Mag Hydroxide-Simeth (MAGIC MOUTHWASH) SOLN  Take 5 mLs by mouth 4 (four) times daily as needed.  240 mL  6   .  aspirin 325 MG tablet  Take 325 mg by mouth daily.     [name]  atorvastatin (LIPITOR) 20 MG tablet  Take 20 mg by mouth daily.     .  B Complex-C (SUPER B COMPLEX  PO)  Take 1 tablet by mouth daily.     .  carvedilol (COREG) 25 MG tablet  Take 1 tablet (25 mg total) by mouth 2 (two) times daily.  180 tablet  3   .  dexamethasone (DECADRON) 4 MG tablet  Take 2 tablets (8 mg total) by mouth 2 (two) times daily with a meal. Take two times a day starting the day after chemotherapy for 3 days.  30 tablet  1   .  dexamethasone (DECADRON) 4 MG tablet  Take 1 by mouth daily  30 tablet  6   .  Docusate Calcium (STOOL SOFTENER  PO)  Take 2 capsules by mouth every morning.     [name]  EPINEPHrine (EPIPEN 2-PAK) 0.3 mg/0.3 mL DEVI  Inject 0.3 mg into the muscle.     [name]  HYDROcodone-acetaminophen (NORCO/VICODIN) 5-325 MG per tablet  Take 1-2 tablets by mouth every 4 (four) hours as needed.  30 tablet  0   .  LORazepam (ATIVAN) 0.5 MG tablet  Take 1 tablet (0.5 mg total) by mouth every 6 (six) hours as needed for anxiety (Nausea/Vomiting).  60 tablet  0   .  metoprolol succinate (TOPROL-XL) 50 MG 24 hr tablet  25 mg Twice daily.     .  nitroGLYCERIN (NITROSTAT) 0.4 MG SL tablet  Place 0.4 mg under the tongue every 5 (five) minutes as needed. Chest pain     .  nystatin (MYCOSTATIN) 100000 UNIT/ML suspension  Take 5 mLs (500,000 Units total) by mouth 4 (four) times daily.  60 mL  6   .  omeprazole (PRILOSEC) 20 MG capsule  Take 1 capsule (20 mg total) by mouth daily.  30 capsule  6   .  ondansetron (ZOFRAN) 8 MG tablet  Take 1 tablet (8 mg total) by mouth 2 (two) times daily. Take two times a day starting the day after chemo for 3 days. Then take two times a day as needed for nausea or vomiting.  30 tablet  1   .  oxybutynin (DITROPAN-XL) 10 MG 24 hr tablet  Take 10 mg by mouth daily.     Bertram Gala Glycol-Propyl Glycol 0.4-0.3 % SOLN  Place 1 drop into both eyes every morning.     .  prochlorperazine (COMPAZINE) 10 MG tablet  as needed.     .  prochlorperazine (COMPAZINE) 25 MG suppository  as needed.     .  SF 5000 PLUS 1.1 % CREA dental cream  1.1 %.     .  trimethoprim  (TRIMPEX) 100 MG tablet  Take 100 mg by mouth daily.      No current facility-administered medications for this visit.    Facility-Administered Medications Ordered in Other Visits   Medication  Dose  Route  Frequency  Provider  Last Rate  Last Dose   .  influenza inactive virus vaccine (FLUZONE/FLUARIX) injection 0.5 mL  0.5 mL  Intramuscular  Once  Joselyn Arrow, MD      Physical Exam:  BP 120/80  Pulse 96  Resp 18  Ht 5\' 6"  (1.676 m)  Wt 149 lb (67.586 kg)  BMI 24.06 kg/m2  SpO2 98%  She is in good spirits. She has put on a lot of weight from steroids.  Lung exam is clear.  Cardiac exam shows a regular rate and rhythm with normal heart sounds.   Diagnostic Tests:  *RADIOLOGY REPORT*  Clinical Data: History of pleural effusion, history of ovarian and  breast carcinoma, some shortness of breath  CHEST - 2 VIEW  Comparison: CT chest of 02/23/2012 and chest x-ray of 02/03/2012  Findings: As noted on recent CT, only tiny pleural effusions are  present bilaterally. No infiltrate is noted. Mild cardiomegaly is  stable. A Port-A-Cath is present with the tip overlying the upper  SVC. Median sternotomy sutures are present from prior CABG.  IMPRESSION:  Tiny pleural effusions.  Original Report Authenticated By: Dwyane Dee, M.D.   Impression/Plan:   Her Porta cath is not working appropriately and she still needs chemotherapy and then surgery for ovarinan cancer so we will replace the catheter  today. She had talc pleurodesis via left pleurx catheter last week and if drainage is minimal today we will remove it. I discussed the procedure, alternatives, benefits and risks with the patient and husband including but not limited to bleeding, pneumothorax, infection, recurrent effusion. She understands and agrees to proceed.

## 2012-04-17 NOTE — Interval H&P Note (Signed)
History and Physical Interval Note:  04/17/2012 7:38 AM  [name] [name]  has presented today for surgery, with the diagnosis of  malignant pleural effusions   The various methods of treatment have been discussed with the patient and family. After consideration of risks, benefits and other options for treatment, the patient has consented to  Procedure(s): INSERTION PORT-A-CATH (Bilateral) REMOVAL OF PLEURAL DRAINAGE CATHETER (Left) REMOVAL PORT-A-CATH (Left) as a surgical intervention .  [name] history has been reviewed, patient examined, no change in status, stable for surgery.  I have reviewed the patient's chart and labs.  Questions were answered to the patient's satisfaction.     Alleen Borne

## 2012-04-17 NOTE — Preoperative (Signed)
Beta Blockers   Reason not to administer Beta Blockers:Not Applicable 

## 2012-04-17 NOTE — Brief Op Note (Signed)
04/17/2012  8:53 AM  PATIENT:  [name] [name]  67 y.o. female  PRE-OPERATIVE DIAGNOSIS:   malignant pleural effusions   POST-OPERATIVE DIAGNOSIS:  malignant pleural effusion  PROCEDURE:  Procedure(s): INSERTION of new PORT-A-CATH (Left) REMOVAL OF PLEURAL DRAINAGE CATHETER (Left) REMOVAL of old PORT-A-CATH (Left)  SURGEON:  Surgeon(s) and Role:    * Alleen Borne, MD - Primary  PHYSICIAN ASSISTANT: none  ASSISTANTS: none   ANESTHESIA:   local and IV sedation  EBL:  Total I/O In: 300 [I.V.:300] Out: -   BLOOD ADMINISTERED:none  DRAINS: none   LOCAL MEDICATIONS USED:  LIDOCAINE   SPECIMEN:  No Specimen  DISPOSITION OF SPECIMEN:    COUNTS:  YES    PLAN OF CARE: Discharge to home after PACU  PATIENT DISPOSITION:  PACU - hemodynamically stable.   Delay start of Pharmacological VTE agent (>24hrs) due to surgical blood loss or risk of bleeding: not applicable

## 2012-04-17 NOTE — Anesthesia Preprocedure Evaluation (Addendum)
Anesthesia Evaluation  Patient identified by MRN, date of birth, ID band Patient awake    Reviewed: Allergy & Precautions, H&P , NPO status , Patient's Chart, lab work & pertinent test results  Airway Mallampati: I TM Distance: >3 FB Neck ROM: Full    Dental   Pulmonary shortness of breath and with exertion,          Cardiovascular hypertension, Pt. on medications + angina with exertion + CAD     Neuro/Psych Anxiety Depression    GI/Hepatic GERD-  Medicated and Controlled,  Endo/Other    Renal/GU      Musculoskeletal   Abdominal   Peds  Hematology   Anesthesia Other Findings   Reproductive/Obstetrics                         Anesthesia Physical Anesthesia Plan  ASA: III  Anesthesia Plan: MAC   Post-op Pain Management:    Induction: Intravenous  Airway Management Planned: Natural Airway  Additional Equipment:   Intra-op Plan:   Post-operative Plan:   Informed Consent: I have reviewed the patients History and Physical, chart, labs and discussed the procedure including the risks, benefits and alternatives for the proposed anesthesia with the patient or authorized representative who has indicated his/her understanding and acceptance.     Plan Discussed with: CRNA and Surgeon  Anesthesia Plan Comments:         Anesthesia Quick Evaluation

## 2012-04-17 NOTE — Anesthesia Postprocedure Evaluation (Signed)
Anesthesia Post Note  Patient: [name] [name]  Procedure(s) Performed: Procedure(s) (LRB): INSERTION PORT-A-CATH (Left) REMOVAL OF PLEURAL DRAINAGE CATHETER (Left) REMOVAL PORT-A-CATH (Left)  Anesthesia type: general  Patient location: PACU  Post pain: Pain level controlled  Post assessment: Patient's Cardiovascular Status Stable  Last Vitals:  Filed Vitals:   04/17/12 0923  BP:   Pulse: 85  Temp:   Resp: 16    Post vital signs: Reviewed and stable  Level of consciousness: sedated  Complications: No apparent anesthesia complications

## 2012-04-17 NOTE — Transfer of Care (Signed)
Immediate Anesthesia Transfer of Care Note  Patient: [name] [name]  Procedure(s) Performed: Procedure(s): INSERTION PORT-A-CATH (Left) REMOVAL OF PLEURAL DRAINAGE CATHETER (Left) REMOVAL PORT-A-CATH (Left)  Patient Location: PACU  Anesthesia Type:MAC  Level of Consciousness: awake, alert  and oriented  Airway & Oxygen Therapy: Patient connected to face mask  Post-op Assessment: Report given to PACU RN  Post vital signs: stable  Complications: No apparent anesthesia complications

## 2012-04-18 ENCOUNTER — Encounter: Payer: Self-pay | Admitting: Cardiovascular Disease

## 2012-04-18 ENCOUNTER — Ambulatory Visit (INDEPENDENT_AMBULATORY_CARE_PROVIDER_SITE_OTHER): Payer: Medicare Other | Admitting: Cardiovascular Disease

## 2012-04-18 VITALS — BP 124/84 | HR 96 | Ht 66.0 in | Wt 162.0 lb

## 2012-04-18 DIAGNOSIS — I2510 Atherosclerotic heart disease of native coronary artery without angina pectoris: Secondary | ICD-10-CM

## 2012-04-18 NOTE — Assessment & Plan Note (Signed)
[name] [name] is doing about as well as could have hoped.  Her recent echo showed normal LV systolic dysfunction.  She has moderate - severe CAD but she has not had any episodes of angina. [name] not having any symptoms of congestive heart failure.  She scheduled have extensive surgery involving an oophorectomy and removal of her ovarian cancer. [name] already had a hysterectomy.  At this point we have little choice but to be optimistic and push ahead with surgery. I do not see any advantage in delayingher surgery for any further cardiac testing because at this point we really don't have any cardiac procedures that we can offer.  Fortunately, I think that she'll do well with her upcoming GYN surgery. I'll see her again in 3 months for followup visit.

## 2012-04-18 NOTE — Progress Notes (Signed)
[name] [name] Date of Birth  1945/03/06       [facility]    [place] [address]  [address], [address] [place], [place]  [address]   [place], [place]  [address] [phone]     [phone]   Fax  [phone]    Fax [phone]  Problem List: 1. CAD- CABG 2. Breast cancer - on Tamoxifin  3. Hyperlipidemia  4. Interstitial cystitis  History of Present Illness:  [name] [name] has not been doing very well.  She presented with CP and cardiac cath revealed that the saphenous vein graft to the left circumflex artery was severely diseased. Her native circumflex artery has only minor luminal irregularities. The saphenous vein graft to right coronary artery was also found to be occluded and her native right coronary artery is chronically occluded.  Since that time she's been tried on medical therapy.  We doubled her metoprol and added isosorbide.. This has caused profound fatigue. She also has had severe shortness of breath, and severe headache. She has stopped the isosorbide ( because of the headache).  She also has been having lots of acid reflux and a chronic cough.  She has noted exertional dyspnea, and cough.  April 18, 2012:  That he has had a rough time since I last saw her. She has been receiving chemotherapy for ovarian cancer. She has had bilateral Pleurx tubes for chronic pleural effusions appear she's had talc placement into her pleural space.  She has just completed her chemotherapy and is now scheduled have surgical debridement of her tumor and ovaries. We have obtained an echocardiogram which reveals normal left systolic function. [name] not having any episodes of chest pain or shortness breath. She is very fatigued and quite deconditioned.  Current Outpatient Prescriptions on File Prior to Visit  Medication Sig Dispense Refill  . ALPRAZolam (XANAX) 0.25 MG tablet Take 1 tablet (0.25 mg total) by mouth every 8 (eight) hours as needed  for anxiety.  30 tablet  0  . Alum & Mag Hydroxide-Simeth (MAGIC MOUTHWASH) SOLN Take 5 mLs by mouth 4 (four) times daily as needed.  240 mL  6  . aspirin 325 MG tablet Take 325 mg by mouth daily.      [name] atorvastatin (LIPITOR) 20 MG tablet Take 20 mg by mouth daily.      . B Complex-C (SUPER B COMPLEX PO) Take 1 tablet by mouth daily.      . carvedilol (COREG) 25 MG tablet Take 1 tablet (25 mg total) by mouth 2 (two) times daily.  180 tablet  3  . dexamethasone (DECADRON) 4 MG tablet Take 1 by mouth daily  30 tablet  6  . Docusate Calcium (STOOL SOFTENER PO) Take 2 capsules by mouth every morning.      [name] EPINEPHrine (EPIPEN 2-PAK) 0.3 mg/0.3 mL DEVI Inject 0.3 mg into the muscle.       [name] HYDROcodone-acetaminophen (NORCO/VICODIN) 5-325 MG per tablet Take 1-2 tablets by mouth every 4 (four) hours as needed.  30 tablet  0  . LORazepam (ATIVAN) 0.5 MG tablet Take 1 tablet (0.5 mg total) by mouth every 6 (six) hours as needed for anxiety (Nausea/Vomiting).  60 tablet  0  . metoprolol succinate (TOPROL-XL) 50 MG 24 hr tablet Take 50 mg by mouth daily. Take with or immediately following a meal.      . nitroGLYCERIN (NITROSTAT) 0.4 MG SL tablet Place 0.4 mg under the tongue every 5 (  five) minutes as needed. Chest pain      . nystatin (MYCOSTATIN) 100000 UNIT/ML suspension Take 5 mLs (500,000 Units total) by mouth 4 (four) times daily.  60 mL  3  . omeprazole (PRILOSEC) 20 MG capsule Take 1 capsule (20 mg total) by mouth daily.  30 capsule  6  . ondansetron (ZOFRAN) 8 MG tablet Take 1 tablet (8 mg total) by mouth 2 (two) times daily. Take two times a day starting the day after chemo for 3 days. Then take two times a day as needed for nausea or vomiting.  30 tablet  1  . oxybutynin (DITROPAN-XL) 10 MG 24 hr tablet Take 10 mg by mouth daily.      Bertram Gala Glycol-Propyl Glycol 0.4-0.3 % SOLN Place 1 drop into both eyes every morning.       . prochlorperazine (COMPAZINE) 10 MG tablet Take 10 mg by mouth  every 6 (six) hours as needed. For nausea      . prochlorperazine (COMPAZINE) 25 MG suppository Place 25 mg rectally every 12 (twelve) hours as needed for nausea.       . SF 5000 PLUS 1.1 % CREA dental cream Place 1.1 % onto teeth at bedtime.       [name] trimethoprim (TRIMPEX) 100 MG tablet Take 100 mg by mouth daily.       Current Facility-Administered Medications on File Prior to Visit  Medication Dose Route Frequency [name] [name] Last Rate Last Dose  . influenza  inactive virus vaccine (FLUZONE/FLUARIX) injection 0.5 mL  0.5 mL Intramuscular Once [name] Arrow, MD        Allergies  Allergen Reactions  . Codeine Nausea And Vomiting       . Isosorbide Other (See Comments)    Extreme headaches  . Other Swelling    All Lip moisturizers except Vaseline.  . Phenothiazines Other (See Comments)    Makes her stop breathing.  . Talwin (Pentazocine) Nausea And Vomiting  . Yellow Jacket Venom Anaphylaxis    Past Medical History  Diagnosis Date  . Interstitial cystitis     on chronic antibiotics  . Hyperlipidemia   . Frequent UTI     on prophylaxis  . 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 CAD (coronary artery disease)     a. s/p CABG 7/13;   b.  LHC 12/01/11:  pLAD 70%, mLAD 40%, CFX 40-50% prior to takeoff of the OM2, oRCA occluded, mid vessel filled via R->R collaterals and distal vessel filled by L->R collaterals, S-OM1/OM2 (small and diffusely dz) with mid 90% stenosis, 90% at OM1 anastomotic site, continuation of OM2 occluded, S-Dx patent, S-PDA occluded, L-LAD ok, EF 55-65%  => Med Rx rec.  [name] Hx of echocardiogram     Echo 5/13: EF 60-65%, grade 2 diastolic dysfunction  . Hypercholesterolemia   . Pleural effusion 12/28/11    s/p pleurx catheter  . GERD (gastroesophageal reflux disease)   . Arthritis     "just a little; lower back" (12/28/11)  . Gout attack 08/2011    related to "stress post OHS"  . Depression   . Breast cancer     "left"; on Tamoxifen  . S/P thoracentesis 12/28/11    "for  pleural effusion" (12/28/2011)  . Ovarian cancer   . Tachycardia     Past Surgical History  Procedure Laterality Date  . Breast lumpectomy  04/2009    left  . Cesarean section  1973; 1976  . Muscle release  1960  L neck and chest.; "when I was 13; pneumonia settled in my left neck"  . Coronary artery bypass graft  08/18/2011    Procedure: CORONARY ARTERY BYPASS GRAFTING (CABG);  Surgeon: Alleen Borne, MD;  Location: [facility] OR;  Service: Open Heart Surgery;  Laterality: N/A;  Coronary Artery Bypass Graft times five utilizing the left internal mammary artery and the left greater saphenous vein harvested endoscopically.  . Abdominal hysterectomy  1976  . Appendectomy  1976  . Portacath placement  01/06/2012    Procedure: INSERTION PORT-A-CATH;  Surgeon: Alleen Borne, MD;  Location: [facility] OR;  Service: Thoracic;  Laterality: Left;  . Chest tube insertion  01/06/2012    Procedure: INSERTION PLEURAL DRAINAGE CATHETER;  Surgeon: Alleen Borne, MD;  Location: [facility] OR;  Service: Thoracic;  Laterality: Bilateral;  . Removal of pleural drainage catheter Right 04/12/2012    Procedure: MINOR REMOVAL OF PLEURAL DRAINAGE CATHETER;  Surgeon: Alleen Borne, MD;  Location: [facility] OR;  Service: Thoracic;  Laterality: Right;  . Talc pleurodesis Left 04/12/2012    Procedure: Lurlean Nanny;  Surgeon: Alleen Borne, MD;  Location: [facility] OR;  Service: Thoracic;  Laterality: Left;    History  Smoking status  . Former Smoker -- 0.12 packs/day for 15 years  . Types: Cigarettes  . Quit date: 02/15/2001  Smokeless tobacco  . Never Used    History  Alcohol Use  . 4.2 oz/week  . 7 Shots of liquor per week    Comment: 12/28/11 "1 gin & tonic q hs", 2/5/141-2 glass a wine occa    Family History  Problem Relation Age of Onset  . Cancer Mother 44    breast cancer  . Heart disease Father 65    MI at 16, CABG in 3's  . Hepatitis Father     C from blood transfusion  . Heart disease Brother     CABG in 34's  .  Heart disease Paternal Aunt   . Heart disease Paternal Uncle   . Heart disease Paternal Grandfather   . Diabetes Neg Hx     Reviw of Systems:  Reviewed in the HPI.  All other systems are negative.  Physical Exam: Blood pressure 124/84, pulse 96, height 5\' 6"  (1.676 m), weight 162 lb (73.483 kg), SpO2 97.00%. General: chronically ill appearing female, edematous, alopecia  Head: Normocephalic, atraumatic, sclera non-icteric, mucus membranes are moist,   Neck: Supple. Carotids are 2 + without bruits. No JVD  Lungs: decreased breath sounds in the bases.  Right pleurex tube site has stitches.  The left pleurex site is bandaged.    Heart: regular rate.  normal  S1 S2. No murmurs, gallops or rubs.  Abdomen: mild distension  Msk:    Extremities: No clubbing or cyanosis. 1+ edema.  Distal pedal pulses are 2+ and equal bilaterally.  Neuro: Alert and oriented X 3. Moves all extremities spontaneously.  Psych:  Responds to questions appropriately with a normal affect.  ECG: April 18, 2012:  Ectopic atrial ryhtym.  No ST or T wave changes  Assessment / Plan:

## 2012-04-18 NOTE — Patient Instructions (Addendum)
YOU ARE CLEARED FOR SURGERY AT MODERATE RISK.   Your physician recommends that you schedule a follow-up appointment in: 3 MONTHS   Your physician recommends that you continue on your current medications as directed. Please refer to the Current Medication list given to you today.

## 2012-04-19 NOTE — Telephone Encounter (Signed)
Noted  

## 2012-04-19 NOTE — Telephone Encounter (Signed)
Follow up call    Was told to call back with additional information.     Taken 25 mg of coreg twice daily .

## 2012-04-21 ENCOUNTER — Ambulatory Visit (HOSPITAL_BASED_OUTPATIENT_CLINIC_OR_DEPARTMENT_OTHER): Payer: Medicare Other | Admitting: Oncology

## 2012-04-21 ENCOUNTER — Other Ambulatory Visit (HOSPITAL_BASED_OUTPATIENT_CLINIC_OR_DEPARTMENT_OTHER): Payer: BC Managed Care – PPO | Admitting: Lab

## 2012-04-21 ENCOUNTER — Ambulatory Visit (HOSPITAL_BASED_OUTPATIENT_CLINIC_OR_DEPARTMENT_OTHER): Payer: BC Managed Care – PPO

## 2012-04-21 ENCOUNTER — Encounter: Payer: Self-pay | Admitting: Adult Health

## 2012-04-21 VITALS — BP 106/71 | HR 87 | Temp 97.9°F | Resp 16 | Wt 162.0 lb

## 2012-04-21 DIAGNOSIS — G589 Mononeuropathy, unspecified: Secondary | ICD-10-CM

## 2012-04-21 DIAGNOSIS — B370 Candidal stomatitis: Secondary | ICD-10-CM

## 2012-04-21 DIAGNOSIS — C569 Malignant neoplasm of unspecified ovary: Secondary | ICD-10-CM

## 2012-04-21 LAB — COMPREHENSIVE METABOLIC PANEL (CC13)
AST: 18 U/L (ref 5–34)
Albumin: 2.6 g/dL — ABNORMAL LOW (ref 3.5–5.0)
Alkaline Phosphatase: 84 U/L (ref 40–150)
BUN: 19.2 mg/dL (ref 7.0–26.0)
Creatinine: 0.6 mg/dL (ref 0.6–1.1)
Glucose: 134 mg/dl — ABNORMAL HIGH (ref 70–99)
Potassium: 4.1 mEq/L (ref 3.5–5.1)
Total Bilirubin: 0.22 mg/dL (ref 0.20–1.20)

## 2012-04-21 LAB — CBC WITH DIFFERENTIAL/PLATELET
BASO%: 0.2 % (ref 0.0–2.0)
Basophils Absolute: 0 10*3/uL (ref 0.0–0.1)
EOS%: 0.2 % (ref 0.0–7.0)
HCT: 32.3 % — ABNORMAL LOW (ref 34.8–46.6)
HGB: 10 g/dL — ABNORMAL LOW (ref 11.6–15.9)
LYMPH%: 8.4 % — ABNORMAL LOW (ref 14.0–49.7)
MCH: 31.5 pg (ref 25.1–34.0)
MCHC: 31 g/dL — ABNORMAL LOW (ref 31.5–36.0)
MCV: 101.9 fL — ABNORMAL HIGH (ref 79.5–101.0)
MONO%: 4.9 % (ref 0.0–14.0)
NEUT%: 86.3 % — ABNORMAL HIGH (ref 38.4–76.8)
Platelets: 281 10*3/uL (ref 145–400)
lymph#: 1.1 10*3/uL (ref 0.9–3.3)

## 2012-04-21 MED ORDER — HEPARIN SOD (PORK) LOCK FLUSH 100 UNIT/ML IV SOLN
500.0000 [IU] | Freq: Once | INTRAVENOUS | Status: AC | PRN
  Administered 2012-04-21: 500 [IU]
  Filled 2012-04-21: qty 5

## 2012-04-21 MED ORDER — PACLITAXEL CHEMO INJECTION 300 MG/50ML
175.0000 mg/m2 | Freq: Once | INTRAVENOUS | Status: AC
  Administered 2012-04-21: 288 mg via INTRAVENOUS
  Filled 2012-04-21: qty 48

## 2012-04-21 MED ORDER — FAMOTIDINE IN NACL 20-0.9 MG/50ML-% IV SOLN
20.0000 mg | Freq: Once | INTRAVENOUS | Status: AC
  Administered 2012-04-21: 20 mg via INTRAVENOUS

## 2012-04-21 MED ORDER — SODIUM CHLORIDE 0.9 % IV SOLN
545.5000 mg | Freq: Once | INTRAVENOUS | Status: AC
  Administered 2012-04-21: 550 mg via INTRAVENOUS
  Filled 2012-04-21: qty 55

## 2012-04-21 MED ORDER — SODIUM CHLORIDE 0.9 % IJ SOLN
10.0000 mL | INTRAMUSCULAR | Status: DC | PRN
  Administered 2012-04-21: 10 mL
  Filled 2012-04-21: qty 10

## 2012-04-21 MED ORDER — DIPHENHYDRAMINE HCL 50 MG/ML IJ SOLN
50.0000 mg | Freq: Once | INTRAMUSCULAR | Status: AC
  Administered 2012-04-21: 50 mg via INTRAVENOUS

## 2012-04-21 MED ORDER — ONDANSETRON 16 MG/50ML IVPB (CHCC)
16.0000 mg | Freq: Once | INTRAVENOUS | Status: AC
  Administered 2012-04-21: 16 mg via INTRAVENOUS

## 2012-04-21 MED ORDER — SODIUM CHLORIDE 0.9 % IV SOLN
Freq: Once | INTRAVENOUS | Status: AC
  Administered 2012-04-21: 11:00:00 via INTRAVENOUS

## 2012-04-21 MED ORDER — DEXAMETHASONE SODIUM PHOSPHATE 4 MG/ML IJ SOLN
20.0000 mg | Freq: Once | INTRAMUSCULAR | Status: AC
  Administered 2012-04-21: 20 mg via INTRAVENOUS

## 2012-04-21 NOTE — Patient Instructions (Addendum)
[facility] Discharge Instructions for Patients Receiving Chemotherapy  Today you received the following chemotherapy agents :  Taxol, Carboplatin  To help prevent nausea and vomiting after your treatment, we encourage you to take your nausea medication     If you develop nausea and vomiting that is not controlled by your nausea medication, call the clinic. If it is after clinic hours your family physician or the after hours number for the clinic or go to the Emergency Department.   BELOW ARE SYMPTOMS THAT SHOULD BE REPORTED IMMEDIATELY:  *FEVER GREATER THAN 100.5 F  *CHILLS WITH OR WITHOUT FEVER  NAUSEA AND VOMITING THAT IS NOT CONTROLLED WITH YOUR NAUSEA MEDICATION  *UNUSUAL SHORTNESS OF BREATH  *UNUSUAL BRUISING OR BLEEDING  TENDERNESS IN MOUTH AND THROAT WITH OR WITHOUT PRESENCE OF ULCERS  *URINARY PROBLEMS  *BOWEL PROBLEMS  UNUSUAL RASH Items with * indicate a potential emergency and should be followed up as soon as possible.   Please let the nurse know about any problems that you may have experienced. Feel free to call the clinic you have any questions or concerns. The clinic phone number is [phone].   I have been informed and understand all the instructions given to me. I know to contact the clinic, my physician, or go to the Emergency Department if any problems should occur. I do not have any questions at this time, but understand that I may call the clinic during office hours   should I have any questions or need assistance in obtaining follow up care.    __________________________________________  _____________  __________ Signature of Patient or Authorized Representative            Date                   Time    __________________________________________ Nurse's Signature

## 2012-04-22 ENCOUNTER — Ambulatory Visit (HOSPITAL_BASED_OUTPATIENT_CLINIC_OR_DEPARTMENT_OTHER): Payer: BC Managed Care – PPO

## 2012-04-22 VITALS — BP 142/82 | HR 83 | Temp 97.2°F | Resp 20

## 2012-04-22 DIAGNOSIS — C569 Malignant neoplasm of unspecified ovary: Secondary | ICD-10-CM

## 2012-04-22 MED ORDER — PEGFILGRASTIM INJECTION 6 MG/0.6ML
6.0000 mg | Freq: Once | SUBCUTANEOUS | Status: AC
  Administered 2012-04-22: 6 mg via SUBCUTANEOUS

## 2012-04-24 ENCOUNTER — Other Ambulatory Visit: Payer: BC Managed Care – PPO | Admitting: Lab

## 2012-04-24 ENCOUNTER — Ambulatory Visit: Payer: BC Managed Care – PPO | Admitting: Oncology

## 2012-04-26 ENCOUNTER — Ambulatory Visit (HOSPITAL_COMMUNITY)
Admission: RE | Admit: 2012-04-26 | Discharge: 2012-04-26 | Disposition: A | Discharge status: Home / Self Care | Payer: BC Managed Care – PPO | Source: Ambulatory Visit | Attending: Adult Health | Admitting: Adult Health

## 2012-04-26 ENCOUNTER — Encounter: Payer: Self-pay | Admitting: Physician Assistant

## 2012-04-26 ENCOUNTER — Encounter (HOSPITAL_COMMUNITY): Payer: Self-pay | Admitting: Surgery

## 2012-04-26 DIAGNOSIS — C569 Malignant neoplasm of unspecified ovary: Secondary | ICD-10-CM | POA: Insufficient documentation

## 2012-04-26 DIAGNOSIS — M4000 Postural kyphosis, site unspecified: Secondary | ICD-10-CM | POA: Insufficient documentation

## 2012-04-26 DIAGNOSIS — C774 Secondary and unspecified malignant neoplasm of inguinal and lower limb lymph nodes: Secondary | ICD-10-CM | POA: Insufficient documentation

## 2012-04-26 DIAGNOSIS — R142 Eructation: Secondary | ICD-10-CM | POA: Insufficient documentation

## 2012-04-26 DIAGNOSIS — J90 Pleural effusion, not elsewhere classified: Secondary | ICD-10-CM | POA: Insufficient documentation

## 2012-04-26 DIAGNOSIS — M47814 Spondylosis without myelopathy or radiculopathy, thoracic region: Secondary | ICD-10-CM | POA: Insufficient documentation

## 2012-04-26 DIAGNOSIS — Z9221 Personal history of antineoplastic chemotherapy: Secondary | ICD-10-CM | POA: Insufficient documentation

## 2012-04-26 DIAGNOSIS — C786 Secondary malignant neoplasm of retroperitoneum and peritoneum: Secondary | ICD-10-CM | POA: Insufficient documentation

## 2012-04-26 DIAGNOSIS — M4120 Other idiopathic scoliosis, site unspecified: Secondary | ICD-10-CM | POA: Insufficient documentation

## 2012-04-26 DIAGNOSIS — J984 Other disorders of lung: Secondary | ICD-10-CM | POA: Insufficient documentation

## 2012-04-26 DIAGNOSIS — R141 Gas pain: Secondary | ICD-10-CM | POA: Insufficient documentation

## 2012-04-26 DIAGNOSIS — N9489 Other specified conditions associated with female genital organs and menstrual cycle: Secondary | ICD-10-CM | POA: Insufficient documentation

## 2012-04-26 MED ORDER — IOHEXOL 300 MG/ML  SOLN
100.0000 mL | Freq: Once | INTRAMUSCULAR | Status: AC | PRN
  Administered 2012-04-26: 100 mL via INTRAVENOUS

## 2012-04-26 NOTE — Progress Notes (Signed)
Patient ID: [name] [name], female   DOB: 1945/09/29, 67 y.o.   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  Consent for procedure obtained. Time Out: Verified patient identification, verified procedure, site/side was marked, verified correct patient position, special equipment/implants available, medications/allergies/relevent history reviewed, required imaging and test results available.  Performed   Procedure: #1  [name] left side was prepped and draped in sterile fashion.  The end cap of the Pleur-x catheter was removed and a luer lock connector was placed. 4 G of Talc Slurry was injected into Left Pleural Space via Left Pleur-X Catheter.  The cap was placed and a clean dry dressing was replaced.  The patient tolerated the procedure well.  Procedure: #2   Local Anesthesia Used: 1% Lidocaine without epinephrine, approximately 10cc  [name] right side was prepped and draped in sterile fashion.  Local anesthesia was injected in a U- shape around the patient's Pleur-x Catheter exit site.  Once patient was adequately numb, blunt dissection using a hemostat was performed to remove adhesions to the cuff of the Pleur-x catheter.  Once all adhesions were removed the Pleur-x catheter was removed without difficulty.  The wound was closed using 2 interrupted 3-0 Nylon sutures.  The patient tolerated the procedure well.  There was minimal blood loss during the procedure.  A clean and dry dressing was applied.     Lowella Dandy 04/26/2012, 3:02 PM

## 2012-04-28 ENCOUNTER — Other Ambulatory Visit (HOSPITAL_BASED_OUTPATIENT_CLINIC_OR_DEPARTMENT_OTHER): Payer: BC Managed Care – PPO | Admitting: Lab

## 2012-04-28 ENCOUNTER — Encounter: Payer: Self-pay | Admitting: Adult Health

## 2012-04-28 ENCOUNTER — Ambulatory Visit (HOSPITAL_BASED_OUTPATIENT_CLINIC_OR_DEPARTMENT_OTHER): Payer: Medicare Other | Admitting: Adult Health

## 2012-04-28 ENCOUNTER — Telehealth: Payer: Self-pay | Admitting: Oncology

## 2012-04-28 ENCOUNTER — Ambulatory Visit: Payer: BC Managed Care – PPO | Admitting: Lab

## 2012-04-28 VITALS — BP 125/79 | HR 83 | Temp 98.1°F | Ht 66.0 in | Wt 156.2 lb

## 2012-04-28 DIAGNOSIS — C569 Malignant neoplasm of unspecified ovary: Secondary | ICD-10-CM

## 2012-04-28 LAB — CBC WITH DIFFERENTIAL/PLATELET
Basophils Absolute: 0.1 10*3/uL (ref 0.0–0.1)
Eosinophils Absolute: 0 10*3/uL (ref 0.0–0.5)
HCT: 30.8 % — ABNORMAL LOW (ref 34.8–46.6)
HGB: 10.3 g/dL — ABNORMAL LOW (ref 11.6–15.9)
MCH: 33.2 pg (ref 25.1–34.0)
MCV: 99.3 fL (ref 79.5–101.0)
MONO%: 3.7 % (ref 0.0–14.0)
NEUT#: 17.8 10*3/uL — ABNORMAL HIGH (ref 1.5–6.5)
NEUT%: 89.9 % — ABNORMAL HIGH (ref 38.4–76.8)
RDW: 18.2 % — ABNORMAL HIGH (ref 11.2–14.5)

## 2012-04-28 LAB — COMPREHENSIVE METABOLIC PANEL (CC13)
Albumin: 3 g/dL — ABNORMAL LOW (ref 3.5–5.0)
BUN: 21.5 mg/dL (ref 7.0–26.0)
Calcium: 9 mg/dL (ref 8.4–10.4)
Chloride: 100 mEq/L (ref 98–107)
Creatinine: 0.7 mg/dL (ref 0.6–1.1)
Glucose: 179 mg/dl — ABNORMAL HIGH (ref 70–99)
Potassium: 3.4 mEq/L — ABNORMAL LOW (ref 3.5–5.1)

## 2012-04-28 NOTE — Progress Notes (Signed)
OFFICE PROGRESS NOTE  CC KNAPP,[name], MD [address] [place] [address] Dr. Emelia Loron Dr. 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  PRIOR THERAPY: #1. S/P left breast lumpectomy in March 2011 after she had a screen detected 1.0 cm ER+, PR+, intermediate grade DCIS. Patient had 3 sentinel biopsied all of them were negative for metastatic disease. Patient underwent radiation therapy between 06/05/2009 through 07/02/2009. She was then begun on tamoxifen 20 mg daily in August 2011.  #2 new diagnosis of gynecologic malignancy presenting with abdominal mass and pleural effusions. Patient was recently hospitalized with shortness of breath she was discovered to have malignant pleural effusion she is status post Pleurx catheter is. She also had malignant ascites she's had several paracentesis procedures performed during her hospitalization. During her hospitalization she was seen by gynecologic oncology.  #3 patient began neoadjuvant chemotherapy consisting of Taxol and carboplatinum. Her first cycle was administered during her hospitalization. Total of 6 cycles of Taxol and carboplatinum are planned. She has also been seen by gynecologic oncology. She will see them again at completion of 6 cycles. We will also plan on doing restaging scans at that time.   CURRENT THERAPY: Cycle 6 day 8 of Taxol and carboplatinum with Neulasta support  INTERVAL HISTORY: [name] [name] 67 y.o. female returns for follow up visit.  [name] doing well.   She continues to have fatigue.  Her numbness is improved with the Super b complex.  She does feel slightly imbalanced and weak.  She has not had any nausea, but did have occasional diarrhea.  She had a CT scan that showed no change in the cancer.  She also has had her pleurx catheters removed and a left pleurodesis.  Her shortness of breath is stable.  She has an appt. With Dr.  Rica Records for surgical evaluation.  Otherwise a 10 point ROS is neg.   MEDICAL HISTORY: Past Medical History  Diagnosis Date  . Interstitial cystitis     on chronic antibiotics  . Hyperlipidemia   . Frequent UTI     on prophylaxis  . 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 CAD (coronary artery disease)     a. s/p CABG 7/13;   b.  LHC 12/01/11:  pLAD 70%, mLAD 40%, CFX 40-50% prior to takeoff of the OM2, oRCA occluded, mid vessel filled via R->R collaterals and distal vessel filled by L->R collaterals, S-OM1/OM2 (small and diffusely dz) with mid 90% stenosis, 90% at OM1 anastomotic site, continuation of OM2 occluded, S-Dx patent, S-PDA occluded, L-LAD ok, EF 55-65%  => Med Rx rec.  [name] Hx of echocardiogram     Echo 5/13: EF 60-65%, grade 2 diastolic dysfunction  . Hypercholesterolemia   . Pleural effusion 12/28/11    s/p pleurx catheter  . GERD (gastroesophageal reflux disease)   . Arthritis     "just a little; lower back" (12/28/11)  . Gout attack 08/2011    related to "stress post OHS"  . Depression   . Breast cancer     "left"; on Tamoxifen  . S/P thoracentesis 12/28/11    "for pleural effusion" (12/28/2011)  . Ovarian cancer   . Tachycardia     ALLERGIES:  is allergic to codeine; isosorbide; other; phenothiazines; talwin; and yellow jacket venom.  MEDICATIONS:  Current Outpatient Prescriptions  Medication Sig Dispense Refill  . ALPRAZolam (XANAX) 0.25 MG tablet Take 1 tablet (0.25 mg total) by mouth  every 8 (eight) hours as needed for anxiety.  30 tablet  0  . Alum & Mag Hydroxide-Simeth (MAGIC MOUTHWASH) SOLN Take 5 mLs by mouth 4 (four) times daily as needed.  240 mL  6  . aspirin 325 MG tablet Take 325 mg by mouth daily.      [name] atorvastatin (LIPITOR) 20 MG tablet Take 20 mg by mouth daily.      . B Complex-C (SUPER B COMPLEX PO) Take 1 tablet by mouth daily.      . carvedilol (COREG) 25 MG tablet Take 1 tablet (25 mg total) by mouth 2 (two) times daily.  180 tablet  3  .  dexamethasone (DECADRON) 4 MG tablet Take 1 by mouth daily  30 tablet  6  . Docusate Calcium (STOOL SOFTENER PO) Take 2 capsules by mouth every morning.      [name] EPINEPHrine (EPIPEN 2-PAK) 0.3 mg/0.3 mL DEVI Inject 0.3 mg into the muscle.       [name] LORazepam (ATIVAN) 0.5 MG tablet Take 1 tablet (0.5 mg total) by mouth every 6 (six) hours as needed for anxiety (Nausea/Vomiting).  60 tablet  0  . nitroGLYCERIN (NITROSTAT) 0.4 MG SL tablet Place 0.4 mg under the tongue every 5 (five) minutes as needed. Chest pain      . nystatin (MYCOSTATIN) 100000 UNIT/ML suspension Take 5 mLs (500,000 Units total) by mouth 4 (four) times daily.  60 mL  3  . omeprazole (PRILOSEC) 20 MG capsule Take 1 capsule (20 mg total) by mouth daily.  30 capsule  6  . oxybutynin (DITROPAN-XL) 10 MG 24 hr tablet Take 10 mg by mouth daily.      Bertram Gala Glycol-Propyl Glycol 0.4-0.3 % SOLN Place 1 drop into both eyes every morning.       . prochlorperazine (COMPAZINE) 25 MG suppository Place 25 mg rectally every 12 (twelve) hours as needed for nausea.       . SF 5000 PLUS 1.1 % CREA dental cream Place 1.1 % onto teeth at bedtime.       [name] trimethoprim (TRIMPEX) 100 MG tablet Take 100 mg by mouth daily.      [name] HYDROcodone-acetaminophen (NORCO/VICODIN) 5-325 MG per tablet Take 1-2 tablets by mouth every 4 (four) hours as needed.  30 tablet  0  . ondansetron (ZOFRAN) 8 MG tablet Take 1 tablet (8 mg total) by mouth 2 (two) times daily. Take two times a day starting the day after chemo for 3 days. Then take two times a day as needed for nausea or vomiting.  30 tablet  1  . prochlorperazine (COMPAZINE) 10 MG tablet Take 10 mg by mouth every 6 (six) hours as needed. For nausea       No current facility-administered medications for this visit.   Facility-Administered Medications Ordered in Other Visits  Medication Dose Route Frequency Provider Last Rate Last Dose  . influenza  inactive virus vaccine (FLUZONE/FLUARIX) injection 0.5 mL  0.5  mL Intramuscular Once Joselyn Arrow, MD        SURGICAL HISTORY:  Past Surgical History  Procedure Laterality Date  . Breast lumpectomy  04/2009    left  . Cesarean section  1973; 1976  . Muscle release  1960    L neck and chest.; "when I was 13; pneumonia settled in my left neck"  . Coronary artery bypass graft  08/18/2011    Procedure: CORONARY ARTERY BYPASS GRAFTING (CABG);  Surgeon: Alleen Borne, MD;  Location: [facility] OR;  Service: Open Heart Surgery;  Laterality: N/A;  Coronary Artery Bypass Graft times five utilizing the left internal mammary artery and the left greater saphenous vein harvested endoscopically.  . Abdominal hysterectomy  1976  . Appendectomy  1976  . Portacath placement  01/06/2012    Procedure: INSERTION PORT-A-CATH;  Surgeon: Alleen Borne, MD;  Location: [facility] OR;  Service: Thoracic;  Laterality: Left;  . Chest tube insertion  01/06/2012    Procedure: INSERTION PLEURAL DRAINAGE CATHETER;  Surgeon: Alleen Borne, MD;  Location: [facility] OR;  Service: Thoracic;  Laterality: Bilateral;  . Removal of pleural drainage catheter Right 04/12/2012    Procedure: MINOR REMOVAL OF PLEURAL DRAINAGE CATHETER;  Surgeon: Alleen Borne, MD;  Location: [facility] OR;  Service: Thoracic;  Laterality: Right;  . Talc pleurodesis Left 04/12/2012    Procedure: Lurlean Nanny;  Surgeon: Alleen Borne, MD;  Location: [facility] OR;  Service: Thoracic;  Laterality: Left;  . Portacath placement Left 04/17/2012    Procedure: INSERTION PORT-A-CATH;  Surgeon: Alleen Borne, MD;  Location: [facility] OR;  Service: Thoracic;  Laterality: Left;  . Removal of pleural drainage catheter Left 04/17/2012    Procedure: REMOVAL OF PLEURAL DRAINAGE CATHETER;  Surgeon: Alleen Borne, MD;  Location: [facility] OR;  Service: Thoracic;  Laterality: Left;  . Port-a-cath removal Left 04/17/2012    Procedure: REMOVAL PORT-A-CATH;  Surgeon: Alleen Borne, MD;  Location: [facility] OR;  Service: Thoracic;  Laterality: Left;    REVIEW OF SYSTEMS:   General: fatigue  (+), night sweats (-), fever (-), pain (-) Lymph: palpable nodes (-) HEENT: vision changes (-), mucositis (-), gum bleeding (-), epistaxis (-) Cardiovascular: chest pain (-), palpitations (-) Pulmonary: shortness of breath (-), dyspnea on exertion (-), cough (-), hemoptysis (-) GI:  Early satiety (-), melena (-), dysphagia (-), nausea/vomiting (-), diarrhea (-) GU: dysuria (-), hematuria (-), incontinence (-) Musculoskeletal: joint swelling (-), joint pain (-), back pain (-) Neuro: weakness (-), numbness (+), headache (-), confusion (-) Skin: Rash (-), lesions (-), dryness (-) Psych: depression (-), suicidal/homicidal ideation (-), feeling of hopelessness (-)   PHYSICAL EXAMINATION:  BP 125/79  Pulse 83  Temp(Src) 98.1 F (36.7 C) (Oral)  Ht 5\' 6"  (1.676 m)  Wt 156 lb 3.2 oz (70.852 kg)  BMI 25.22 kg/m2 General: Patient is an ill appearing female in no acute distress HEENT: PERRLA, sclerae anicteric no conjunctival pallor, MMM Neck: supple, no palpable adenopathy Lungs: clear to auscultation bilaterally, no wheezes, rhonchi, or rales Cardiovascular: regular rate rhythm, S1, S2, no murmurs, rubs or gallops Abdomen: Soft, non-tender, non-distended, normoactive bowel sounds, no HSM, pleurx dressings clean dry and intact Extremities: warm and well perfused, no clubbing, cyanosis, or edema Skin: No rashes or lesions Neuro: Non-focal ECOG PERFORMANCE STATUS: 0 - Asymptomatic  LABORATORY DATA: Lab Results  Component Value Date   WBC 19.8* 04/28/2012   HGB 10.3* 04/28/2012   HCT 30.8* 04/28/2012   MCV 99.3 04/28/2012   PLT 304 04/28/2012      Chemistry      Component Value Date/Time   NA 136 04/28/2012 0849   NA 139 04/17/2012 0624   NA 141 12/17/2011 1144   K 3.4* 04/28/2012 0849   K 3.6 04/17/2012 0624   CL 100 04/28/2012 0849   CL 101 04/17/2012 0624   CO2 21* 04/28/2012 0849   CO2 27 04/17/2012 0624   BUN 21.5 04/28/2012 0849   BUN 18 04/17/2012 0624   BUN 15 12/17/2011 1144  CREATININE 0.7 04/28/2012 0849   CREATININE 0.56 04/17/2012 0624      Component Value Date/Time   CALCIUM 9.0 04/28/2012 0849   CALCIUM 9.3 04/17/2012 0624   ALKPHOS 109 04/28/2012 0849   ALKPHOS 78 04/17/2012 0624   AST 18 04/28/2012 0849   AST 19 04/17/2012 0624   ALT 48 04/28/2012 0849   ALT 35 04/17/2012 0624   BILITOT 0.35 04/28/2012 0849   BILITOT 0.3 04/17/2012 0624       RADIOGRAPHIC STUDIES:  No results found.  ASSESSMENT: 67 year old female with:  #1 DCIS of the breast. She is on tamoxifen 20 mg daily. However I have recommended that we discontinue the tamoxifen for now a do to her recent diagnosis of ovarian cancer.  #2 recent diagnosis of gynecologic malignancy this is ovarian carcinoma). Patient is currently receiving neoadjuvant chemotherapy consisting of Taxol and carboplatinum. Overall she is tolerating it well. Recent CT performed on January 8 showed a minute response to chemotherapy. Total of 6 cycles of Taxol and carboplatinum are planned. After that she will be seen back by Dr. Grant Ruts for ovarian surgery.  #3 oral dental hygiene/thrush  #4 Neuropathy  PLAN:  #1 [name] [name] is doing well after chemotherapy. Her scans show stable disease.  She will f/u with Dr. Rica Records next week.   #2 We will see her back after her appt with gyn-onc and their decisions on a plan of care.    All questions were answered. The patient knows to call the clinic with any problems, questions or concerns. We can certainly see the patient much sooner if necessary.  I spent 25 minutes counseling the patient face to face. The total time spent in the appointment was 30 minutes.  Cherie Ouch Lyn Hollingshead, NP Medical Oncology [facility] Phone: [phone] 04/28/2012, 9:37 AM

## 2012-04-28 NOTE — Telephone Encounter (Signed)
Pt sent back to lb - no f/u at this time.

## 2012-04-28 NOTE — Patient Instructions (Signed)
Doing well. We will see you after your appt with Dr. Rica Records and her decisions.  Please call us if you have any questions or concerns.

## 2012-05-01 ENCOUNTER — Ambulatory Visit (INDEPENDENT_AMBULATORY_CARE_PROVIDER_SITE_OTHER): Payer: Medicare Other | Admitting: Surgery

## 2012-05-01 ENCOUNTER — Ambulatory Visit
Admission: RE | Admit: 2012-05-01 | Discharge: 2012-05-01 | Disposition: A | Discharge status: Home / Self Care | Payer: BC Managed Care – PPO | Source: Ambulatory Visit | Attending: Surgery | Admitting: Surgery

## 2012-05-01 ENCOUNTER — Other Ambulatory Visit: Payer: Self-pay | Admitting: *Deleted

## 2012-05-01 ENCOUNTER — Encounter: Payer: Self-pay | Admitting: Surgery

## 2012-05-01 VITALS — BP 121/74 | HR 110 | Resp 20 | Ht 66.0 in | Wt 156.0 lb

## 2012-05-01 DIAGNOSIS — Z09 Encounter for follow-up examination after completed treatment for conditions other than malignant neoplasm: Secondary | ICD-10-CM

## 2012-05-01 NOTE — Progress Notes (Signed)
[address]       [name] [address]             [phone]      HPI:  The patient returns for followup after removal of her left Pleurx catheter with talc pleurodesis and insertion of a new left subclavian vein Port-A-Cath. She said that the Port-A-Cath has been working fine although some of the nurses still have problems accessing it. Her breathing has been good with no cough. She denies any chest pain. She is to undergo surgery for her ovarian cancer in the near future.  Current Outpatient Prescriptions  Medication Sig Dispense Refill  . ALPRAZolam (XANAX) 0.25 MG tablet Take 1 tablet (0.25 mg total) by mouth every 8 (eight) hours as needed for anxiety.  30 tablet  0  . Alum & Mag Hydroxide-Simeth (MAGIC MOUTHWASH) SOLN Take 5 mLs by mouth 4 (four) times daily as needed.  240 mL  6  . aspirin 325 MG tablet Take 325 mg by mouth daily.      [name] atorvastatin (LIPITOR) 20 MG tablet Take 20 mg by mouth daily.      . B Complex-C (SUPER B COMPLEX PO) Take 1 tablet by mouth daily.      . carvedilol (COREG) 25 MG tablet Take 1 tablet (25 mg total) by mouth 2 (two) times daily.  180 tablet  3  . Docusate Calcium (STOOL SOFTENER PO) Take 2 capsules by mouth every morning.      [name] EPINEPHrine (EPIPEN 2-PAK) 0.3 mg/0.3 mL DEVI Inject 0.3 mg into the muscle.       [name] HYDROcodone-acetaminophen (NORCO/VICODIN) 5-325 MG per tablet Take 1-2 tablets by mouth every 4 (four) hours as needed.  30 tablet  0  . LORazepam (ATIVAN) 0.5 MG tablet Take 1 tablet (0.5 mg total) by mouth every 6 (six) hours as needed for anxiety (Nausea/Vomiting).  60 tablet  0  . nitroGLYCERIN (NITROSTAT) 0.4 MG SL tablet Place 0.4 mg under the tongue every 5 (five) minutes as needed. Chest pain      . omeprazole (PRILOSEC) 20 MG capsule Take 1 capsule (20 mg total) by mouth daily.  30 capsule  6  . ondansetron (ZOFRAN) 8 MG tablet Take 1 tablet (8 mg total) by mouth 2 (two) times daily. Take two times a day  starting the day after chemo for 3 days. Then take two times a day as needed for nausea or vomiting.  30 tablet  1  . oxybutynin (DITROPAN-XL) 10 MG 24 hr tablet Take 10 mg by mouth daily.      Bertram Gala Glycol-Propyl Glycol 0.4-0.3 % SOLN Place 1 drop into both eyes every morning.       . prochlorperazine (COMPAZINE) 10 MG tablet Take 10 mg by mouth every 6 (six) hours as needed. For nausea      . prochlorperazine (COMPAZINE) 25 MG suppository Place 25 mg rectally every 12 (twelve) hours as needed for nausea.       . SF 5000 PLUS 1.1 % CREA dental cream Place 1.1 % onto teeth at bedtime.       [name] trimethoprim (TRIMPEX) 100 MG tablet Take 100 mg by mouth daily.       No current facility-administered medications for this visit.   Facility-Administered Medications Ordered in Other Visits  Medication Dose Route Frequency Provider Last Rate Last Dose  . influenza  inactive virus vaccine (FLUZONE/FLUARIX) injection 0.5 mL  0.5 mL Intramuscular Once Eve  Lynelle Doctor, MD         Physical Exam: BP 121/74  Pulse 110  Resp 20  Ht 5\' 6"  (1.676 m)  Wt 156 lb (70.761 kg)  BMI 25.19 kg/m2  SpO2 94% She looks comfortable and in good spirits. Lung exam is clear. The Pleurx catheter exit sites are well-healed. The sutures were removed from the right chest site. The porta-cath site is well-healed.  Diagnostic Tests:  *RADIOLOGY REPORT*   Clinical Data: History of ovarian carcinoma, history of pleural effusion, follow-up   CHEST - 2 VIEW 05/02/18 14   Comparison: Chest x-ray of 04/17/2012 and CT chest of 04/26/2012   Findings: There are persistent bilateral pleural effusions with basilar atelectasis.  A rounded mass-like opacity is now present posteriorly and medially at the left lung base.  Most likely this represents loculated fluid in view of the relatively rapid development. Empyema would also be a consideration.  The heart is mildly enlarged and stable.  Median sternotomy sutures are  noted. A Port-A-Cath is unchanged in position.   IMPRESSION:   1.  Persistent small pleural effusions and basilar atelectasis. 2.  Interval development of a rounded opacity posteriorly and medially at the left lung base most consistent with loculated fluid.     Original Report Authenticated By: Dwyane Dee, M.D.    *RADIOLOGY REPORT*   Clinical Data:  Ovarian cancer restaging.  Response to chemotherapy.  Abdominal distention and bloating.   CT CHEST, ABDOMEN AND PELVIS WITH CONTRAST : 04/26/2012   Technique:  Multidetector CT imaging of the chest, abdomen and pelvis was performed following the standard protocol during bolus administration of intravenous contrast.   Contrast: OMNIPAQUE IOHEXOL 300 MG/ML  SOLN   Comparison:  02/23/2012   CT CHEST   Findings:  Small but increased right pleural effusion noted.  Right pleural drainage catheter has been removed.   Small left pleural effusion noted.  Linear densities in the left pleural space compatible with recent talc pleurodesis.   Left Port-A-Cath noted.  There is evidence of prior CABG.   No pathologic thoracic adenopathy observed.  No significant lung nodule/mass noted.  Thoracic spondylosis and thoracic kyphosis observed.   IMPRESSION:   1.  Small bilateral pleural effusions.  Evidence of interval left talc pleurodesis.   CT ABDOMEN AND PELVIS   Findings:  Mildly prominent left hepatic lobe noted.  Mild nodularity along the anterior liver margin noted with extensive caking of mixed density tumor in the omentum, similar distribution compared to prior, with thickness of the left sided rind of tumor stable at 5.4 cm.  Tumor deposits along the right paracolic gutter and cecum appear unchanged.  Omental tumor deposits similar to prior.  Cystic right adnexal mass unchanged.  Stable appearance of urinary bladder.  High density bilateral inguinal nodules stable.   Fused left sacroiliac joint noted.   Dextroconvex lumbar scoliosis observed with rotary component.  Scattered subcutaneous nodularity along the anterior abdominal wall, similar to prior.   Spleen and adrenal glands unremarkable.  Similar sized porta hepatis, retrocrural, and retrocaval adenopathy compared to prior. The kidneys appear unremarkable, as do the proximal ureters.   The gallbladder and biliary system appear unremarkable.   IMPRESSION:   1.  Overall stable size and distribution of extensive peritoneal, omental, mesenteric, and inguinal nodal metastatic disease.     Original Report Authenticated By: Gaylyn Rong, M.D.   *RADIOLOGY REPORT*   Clinical Data: History of ovarian carcinoma, history of pleural effusion, follow-up   CHEST -  2 VIEW   Comparison: Chest x-ray of 04/17/2012 and CT chest of 04/26/2012   Findings: There are persistent bilateral pleural effusions with basilar atelectasis.  A rounded mass-like opacity is now present posteriorly and medially at the left lung base.  Most likely this represents loculated fluid in view of the relatively rapid development. Empyema would also be a consideration.  The heart is mildly enlarged and stable.  Median sternotomy sutures are noted. A Port-A-Cath is unchanged in position.   IMPRESSION:   1.  Persistent small pleural effusions and basilar atelectasis. 2.  Interval development of a rounded opacity posteriorly and medially at the left lung base most consistent with loculated fluid.     Original Report Authenticated By: Dwyane Dee, M.D.     Impression:  She is doing well following removal of both for Pleurx catheters and left talc pleurodesis. Her CT scan from 04/26/2012 and her chest x-ray from today show very small bilateral pleural effusions that are insignificant.  Plan:  She will continue to be followed by medical oncology and undergo surgery for her ovarian cancer in the near future. I will be happy see her back if she develops any  recurrent problems in the chest.

## 2012-05-03 ENCOUNTER — Ambulatory Visit: Payer: BC Managed Care – PPO | Admitting: Surgery

## 2012-05-03 NOTE — Progress Notes (Signed)
OFFICE PROGRESS NOTE  CC KNAPP,[name], MD [address] [place] [address] Dr. Emelia Loron Dr. 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  PRIOR THERAPY: #1. S/P left breast lumpectomy in March 2011 after she had a screen detected 1.0 cm ER+, PR+, intermediate grade DCIS. Patient had 3 sentinel biopsied all of them were negative for metastatic disease. Patient underwent radiation therapy between 06/05/2009 through 07/02/2009. She was then begun on tamoxifen 20 mg daily in August 2011.  #2 new diagnosis of gynecologic malignancy presenting with abdominal mass and pleural effusions. Patient was recently hospitalized with shortness of breath she was discovered to have malignant pleural effusion she is status post Pleurx catheter is. She also had malignant ascites she's had several paracentesis procedures performed during her hospitalization. During her hospitalization she was seen by gynecologic oncology.  #3 patient began neoadjuvant chemotherapy consisting of Taxol and carboplatinum. Her first cycle was administered during her hospitalization. Total of 6 cycles of Taxol and carboplatinum are planned. She has also been seen by gynecologic oncology. She will see them again at completion of 6 cycles. We will also plan on doing restaging scans at that time. She will complete 6 cycles of Taxol and carboplatinum from 01/10/2012 through 04/21/2012. Overall she tolerated the chemotherapy well.   CURRENT THERAPY: Cycle 6 day 1 of Taxol and carboplatinum with Neulasta support  INTERVAL HISTORY: [name] [name] 67 y.o. female returns for follow up visit.  [name] doing well.   She continues to have fatigue. She is encouraged that she did not experience as much bone pain after the Neulasta.  Her numbness continues to be intermittent and she has retained her motor function, and is no worse.  [name] still doing embroidery.  He did a chest x ray and  removed the right pleurx catheter and pleurodese the left lung.  Otherwise she's doing well and a 10 point ROS is neg.   MEDICAL HISTORY: Past Medical History  Diagnosis Date  . Interstitial cystitis     on chronic antibiotics  . Hyperlipidemia   . Frequent UTI     on prophylaxis  . 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 CAD (coronary artery disease)     a. s/p CABG 7/13;   b.  LHC 12/01/11:  pLAD 70%, mLAD 40%, CFX 40-50% prior to takeoff of the OM2, oRCA occluded, mid vessel filled via R->R collaterals and distal vessel filled by L->R collaterals, S-OM1/OM2 (small and diffusely dz) with mid 90% stenosis, 90% at OM1 anastomotic site, continuation of OM2 occluded, S-Dx patent, S-PDA occluded, L-LAD ok, EF 55-65%  => Med Rx rec.  [name] Hx of echocardiogram     Echo 5/13: EF 60-65%, grade 2 diastolic dysfunction  . Hypercholesterolemia   . Pleural effusion 12/28/11    s/p pleurx catheter  . GERD (gastroesophageal reflux disease)   . Arthritis     "just a little; lower back" (12/28/11)  . Gout attack 08/2011    related to "stress post OHS"  . Depression   . Breast cancer     "left"; on Tamoxifen  . S/P thoracentesis 12/28/11    "for pleural effusion" (12/28/2011)  . Ovarian cancer   . Tachycardia     ALLERGIES:  is allergic to codeine; isosorbide; other; phenothiazines; talwin; and yellow jacket venom.  MEDICATIONS:  Current Outpatient Prescriptions  Medication Sig Dispense Refill  . ALPRAZolam (XANAX) 0.25 MG tablet Take 1 tablet (  0.25 mg total) by mouth every 8 (eight) hours as needed for anxiety.  30 tablet  0  . Alum & Mag Hydroxide-Simeth (MAGIC MOUTHWASH) SOLN Take 5 mLs by mouth 4 (four) times daily as needed.  240 mL  6  . aspirin 325 MG tablet Take 325 mg by mouth daily.      [name] atorvastatin (LIPITOR) 20 MG tablet Take 20 mg by mouth daily.      . B Complex-C (SUPER B COMPLEX PO) Take 1 tablet by mouth daily.      . carvedilol (COREG) 25 MG tablet Take 1  tablet (25 mg total) by mouth 2 (two) times daily.  180 tablet  3  . Docusate Calcium (STOOL SOFTENER PO) Take 2 capsules by mouth every morning.      [name] EPINEPHrine (EPIPEN 2-PAK) 0.3 mg/0.3 mL DEVI Inject 0.3 mg into the muscle.       [name] HYDROcodone-acetaminophen (NORCO/VICODIN) 5-325 MG per tablet Take 1-2 tablets by mouth every 4 (four) hours as needed.  30 tablet  0  . LORazepam (ATIVAN) 0.5 MG tablet Take 1 tablet (0.5 mg total) by mouth every 6 (six) hours as needed for anxiety (Nausea/Vomiting).  60 tablet  0  . nitroGLYCERIN (NITROSTAT) 0.4 MG SL tablet Place 0.4 mg under the tongue every 5 (five) minutes as needed. Chest pain      . omeprazole (PRILOSEC) 20 MG capsule Take 1 capsule (20 mg total) by mouth daily.  30 capsule  6  . ondansetron (ZOFRAN) 8 MG tablet Take 1 tablet (8 mg total) by mouth 2 (two) times daily. Take two times a day starting the day after chemo for 3 days. Then take two times a day as needed for nausea or vomiting.  30 tablet  1  . oxybutynin (DITROPAN-XL) 10 MG 24 hr tablet Take 10 mg by mouth daily.      Bertram Gala Glycol-Propyl Glycol 0.4-0.3 % SOLN Place 1 drop into both eyes every morning.       . prochlorperazine (COMPAZINE) 10 MG tablet Take 10 mg by mouth every 6 (six) hours as needed. For nausea      . prochlorperazine (COMPAZINE) 25 MG suppository Place 25 mg rectally every 12 (twelve) hours as needed for nausea.       . SF 5000 PLUS 1.1 % CREA dental cream Place 1.1 % onto teeth at bedtime.       [name] trimethoprim (TRIMPEX) 100 MG tablet Take 100 mg by mouth daily.       No current facility-administered medications for this visit.   Facility-Administered Medications Ordered in Other Visits  Medication Dose Route Frequency Provider Last Rate Last Dose  . influenza  inactive virus vaccine (FLUZONE/FLUARIX) injection 0.5 mL  0.5 mL Intramuscular Once Joselyn Arrow, MD        SURGICAL HISTORY:  Past Surgical History  Procedure Laterality Date  . Breast  lumpectomy  04/2009    left  . Cesarean section  1973; 1976  . Muscle release  1960    L neck and chest.; "when I was 13; pneumonia settled in my left neck"  . Coronary artery bypass graft  08/18/2011    Procedure: CORONARY ARTERY BYPASS GRAFTING (CABG);  Surgeon: Alleen Borne, MD;  Location: [facility] OR;  Service: Open Heart Surgery;  Laterality: N/A;  Coronary Artery Bypass Graft times five utilizing the left internal mammary artery and the left greater saphenous vein harvested endoscopically.  . Abdominal hysterectomy  1976  .  Appendectomy  1976  . Portacath placement  01/06/2012    Procedure: INSERTION PORT-A-CATH;  Surgeon: Alleen Borne, MD;  Location: [facility] OR;  Service: Thoracic;  Laterality: Left;  . Chest tube insertion  01/06/2012    Procedure: INSERTION PLEURAL DRAINAGE CATHETER;  Surgeon: Alleen Borne, MD;  Location: [facility] OR;  Service: Thoracic;  Laterality: Bilateral;  . Removal of pleural drainage catheter Right 04/12/2012    Procedure: MINOR REMOVAL OF PLEURAL DRAINAGE CATHETER;  Surgeon: Alleen Borne, MD;  Location: [facility] OR;  Service: Thoracic;  Laterality: Right;  . Talc pleurodesis Left 04/12/2012    Procedure: Lurlean Nanny;  Surgeon: Alleen Borne, MD;  Location: [facility] OR;  Service: Thoracic;  Laterality: Left;  . Portacath placement Left 04/17/2012    Procedure: INSERTION PORT-A-CATH;  Surgeon: Alleen Borne, MD;  Location: [facility] OR;  Service: Thoracic;  Laterality: Left;  . Removal of pleural drainage catheter Left 04/17/2012    Procedure: REMOVAL OF PLEURAL DRAINAGE CATHETER;  Surgeon: Alleen Borne, MD;  Location: [facility] OR;  Service: Thoracic;  Laterality: Left;  . Port-a-cath removal Left 04/17/2012    Procedure: REMOVAL PORT-A-CATH;  Surgeon: Alleen Borne, MD;  Location: [facility] OR;  Service: Thoracic;  Laterality: Left;    REVIEW OF SYSTEMS:   General: fatigue (+), night sweats (-), fever (-), pain (-) Lymph: palpable nodes (-) HEENT: vision changes (-), mucositis (-), gum bleeding  (-), epistaxis (-) Cardiovascular: chest pain (-), palpitations (-) Pulmonary: shortness of breath (-), dyspnea on exertion (-), cough (-), hemoptysis (-) GI:  Early satiety (-), melena (-), dysphagia (-), nausea/vomiting (-), diarrhea (-) GU: dysuria (-), hematuria (-), incontinence (-) Musculoskeletal: joint swelling (-), joint pain (-), back pain (-) Neuro: weakness (-), numbness (+), headache (-), confusion (-) Skin: Rash (-), lesions (-), dryness (-) Psych: depression (-), suicidal/homicidal ideation (-), feeling of hopelessness (-)   PHYSICAL EXAMINATION:  BP 106/71  Pulse 87  Temp(Src) 97.9 F (36.6 C)  Resp 16  Wt 162 lb (73.483 kg)  BMI 26.16 kg/m2 General: Patient is an ill appearing female in no acute distress HEENT: PERRLA, sclerae anicteric no conjunctival pallor, MMM Neck: supple, no palpable adenopathy Lungs: clear to auscultation bilaterally, no wheezes, rhonchi, or rales Cardiovascular: regular rate rhythm, S1, S2, no murmurs, rubs or gallops Abdomen: Soft, non-tender, non-distended, normoactive bowel sounds, no HSM, pleurx dressings clean dry and intact Extremities: warm and well perfused, no clubbing, cyanosis, or edema Skin: No rashes or lesions Neuro: Non-focal ECOG PERFORMANCE STATUS: 0 - Asymptomatic  LABORATORY DATA: Lab Results  Component Value Date   WBC 19.8* 04/28/2012   HGB 10.3* 04/28/2012   HCT 30.8* 04/28/2012   MCV 99.3 04/28/2012   PLT 304 04/28/2012      Chemistry      Component Value Date/Time   NA 136 04/28/2012 0849   NA 139 04/17/2012 0624   NA 141 12/17/2011 1144   K 3.4* 04/28/2012 0849   K 3.6 04/17/2012 0624   CL 100 04/28/2012 0849   CL 101 04/17/2012 0624   CO2 21* 04/28/2012 0849   CO2 27 04/17/2012 0624   BUN 21.5 04/28/2012 0849   BUN 18 04/17/2012 0624   BUN 15 12/17/2011 1144   CREATININE 0.7 04/28/2012 0849   CREATININE 0.56 04/17/2012 0624      Component Value Date/Time   CALCIUM 9.0 04/28/2012 0849   CALCIUM 9.3 04/17/2012 0624    ALKPHOS 109 04/28/2012 0849   ALKPHOS 78 04/17/2012  0624   AST 18 04/28/2012 0849   AST 19 04/17/2012 0624   ALT 48 04/28/2012 0849   ALT 35 04/17/2012 0624   BILITOT 0.35 04/28/2012 0849   BILITOT 0.3 04/17/2012 0624       RADIOGRAPHIC STUDIES:  No results found.  ASSESSMENT: 67 year old female with:  #1 DCIS of the breast. She is on tamoxifen 20 mg daily. However I have recommended that we discontinue the tamoxifen for now a do to her recent diagnosis of ovarian cancer.  #2 recent diagnosis of gynecologic malignancy this is ovarian carcinoma). Patient is currently receiving neoadjuvant chemotherapy consisting of Taxol and carboplatinum. Overall she is tolerating it well. Recent CT performed on January 8 showed a minute response to chemotherapy. Total of 6 cycles of Taxol and carboplatinum are planned. After that she will be seen back by Dr. Grant Ruts for ovarian surgery.  #3 oral dental hygiene/thrush  #4 Neuropathy  PLAN:  #1 patient will proceed with cycle 6 of Taxol and carboplatinum.  #2 she will get restaging studies performed and be seen by Dr.Paola Duard Brady to discuss surgery.   All questions were answered. The patient knows to call the clinic with any problems, questions or concerns. We can certainly see the patient much sooner if necessary.  I spent 25 minutes counseling the patient face to face. The total time spent in the appointment was 30 minutes.  Drue Second, MD Medical/Oncology [facility] [phone] (beeper) [phone] (Office)  05/03/2012, 8:14 AM

## 2012-05-04 ENCOUNTER — Encounter: Payer: Self-pay | Admitting: Gynecologic Oncology

## 2012-05-04 ENCOUNTER — Ambulatory Visit (HOSPITAL_BASED_OUTPATIENT_CLINIC_OR_DEPARTMENT_OTHER): Payer: Medicare Other | Admitting: Adult Health

## 2012-05-04 ENCOUNTER — Ambulatory Visit (HOSPITAL_BASED_OUTPATIENT_CLINIC_OR_DEPARTMENT_OTHER): Payer: BC Managed Care – PPO

## 2012-05-04 ENCOUNTER — Ambulatory Visit (HOSPITAL_BASED_OUTPATIENT_CLINIC_OR_DEPARTMENT_OTHER): Payer: BC Managed Care – PPO | Admitting: Lab

## 2012-05-04 ENCOUNTER — Ambulatory Visit: Payer: BC Managed Care – PPO | Attending: Gynecologic Oncology | Admitting: Gynecologic Oncology

## 2012-05-04 ENCOUNTER — Encounter: Payer: Self-pay | Admitting: Adult Health

## 2012-05-04 VITALS — BP 110/66 | HR 88 | Temp 98.1°F | Resp 16 | Ht 66.0 in | Wt 163.4 lb

## 2012-05-04 VITALS — BP 111/71 | HR 95

## 2012-05-04 DIAGNOSIS — R180 Malignant ascites: Secondary | ICD-10-CM | POA: Insufficient documentation

## 2012-05-04 DIAGNOSIS — C569 Malignant neoplasm of unspecified ovary: Secondary | ICD-10-CM

## 2012-05-04 DIAGNOSIS — Z87898 Personal history of other specified conditions: Secondary | ICD-10-CM | POA: Insufficient documentation

## 2012-05-04 DIAGNOSIS — D0590 Unspecified type of carcinoma in situ of unspecified breast: Secondary | ICD-10-CM

## 2012-05-04 DIAGNOSIS — Z90710 Acquired absence of both cervix and uterus: Secondary | ICD-10-CM | POA: Insufficient documentation

## 2012-05-04 DIAGNOSIS — Z9079 Acquired absence of other genital organ(s): Secondary | ICD-10-CM | POA: Insufficient documentation

## 2012-05-04 LAB — CBC WITH DIFFERENTIAL/PLATELET
Eosinophils Absolute: 0 10*3/uL (ref 0.0–0.5)
HGB: 9.2 g/dL — ABNORMAL LOW (ref 11.6–15.9)
MONO#: 0.4 10*3/uL (ref 0.1–0.9)
NEUT#: 9 10*3/uL — ABNORMAL HIGH (ref 1.5–6.5)
RBC: 2.74 10*6/uL — ABNORMAL LOW (ref 3.70–5.45)
RDW: 17.3 % — ABNORMAL HIGH (ref 11.2–14.5)
WBC: 10.4 10*3/uL — ABNORMAL HIGH (ref 3.9–10.3)
lymph#: 1 10*3/uL (ref 0.9–3.3)

## 2012-05-04 LAB — BASIC METABOLIC PANEL (CC13)
CO2: 24 mEq/L (ref 22–29)
Chloride: 101 mEq/L (ref 98–107)
Glucose: 108 mg/dl — ABNORMAL HIGH (ref 70–99)
Potassium: 3.5 mEq/L (ref 3.5–5.1)
Sodium: 137 mEq/L (ref 136–145)

## 2012-05-04 MED ORDER — SODIUM CHLORIDE 0.9 % IV SOLN
1000.0000 mL | Freq: Once | INTRAVENOUS | Status: DC
Start: 2012-05-04 — End: 2012-05-04
  Administered 2012-05-04: 1000 mL via INTRAVENOUS

## 2012-05-04 MED ORDER — FLUTICASONE PROPIONATE 50 MCG/ACT NA SUSP: 2.0000 | Freq: Every day | NASAL | Status: DC

## 2012-05-04 NOTE — Patient Instructions (Signed)
Ovarian Cancer Ovarian cancer is cancer in the ovaries. The ovaries produce eggs in females. Most ovarian cancer is seen in women between the ages of 50 to 75. It happens more often in white women. Most patients do not have problems early in the disease. If this type of cancer is found in its later stages, survival is not as good.  CAUSES  There are no known causes of ovarian cancer. However, below are risk factors for ovarian cancer:   Aging.  North American or Northern European descent.  Personal or family history of endometrial, colon, or breast cancer and a family history of ovarian cancer.  Women with the BRCA 1 and BRCA 2 genes are at a very high risk of getting ovarian cancer.  The use of fertility medications may increase the risk of getting ovarian cancer.  Late menopause (after age 50).  Women who became pregnant for the first time at age 30 or older. Having these risk factors does not mean you will get ovarian cancer. But you should know about them and report any that you have to your caregiver. Also, a woman with none of the risk factors can still get ovarian cancer. SYMPTOMS  There may be no symptoms in some cases. Early ovarian cancer symptoms usually are minor and resemble other health problems. The following are symptoms that may be important to diagnosing cancer of the ovary:  Unexplained weight loss.  Increase abdominal size.  Pain in the abdomen.  Pain and pressure in the back and pelvis.  Indigestion, increased gas and bloating.  Abnormal vaginal bleeding.  Loss of appetite.  Frequent urination.  Painful sexual intercourse.  Tiredness. DIAGNOSIS   During an exam, an abnormal pelvis mass is found.  An ultrasound may be done.  X-ray, CT scan or MRI may be done.  Blood tests are taken. TREATMENT  All women with ovarian cancer should have careful and precise staging performed before treatment for the best outcome. In Stage I ovarian cancer, the  option of keeping the uterus and the normal ovary should be given. All treatment options should be discussed with the patient before any treatment is started. Types of treatment include:  Surgery to remove one or both ovaries. The surgery should be done by a doctor who is an expert in ovarian cancer. He/she should be well trained and have experience with gynecology oncology (cancer surgery).  Removal of both ovaries, both fallopian tubes and uterus. This can be done with or without removing the surrounding lymph nodes.  Chemotherapy. This therapy should be discussed with specialists in those fields.  Radiation therapy. This therapy should be discussed with specialists in those fields.  A combination of surgery, chemotherapy and radiation therapy may be recommended.  After treatment, your caregivers may want to do a "second look" operation. This is to see if the cancer is coming back and if extra treatment is needed. PREVENTION & SCREENING RECOMMENDATIONS  There is no way of preventing ovarian cancer. Regular exams do not improve early diagnosis and treatment success.  Ovary removal and tubal sterilization reduce the risk of ovarian cancer. But even removal of both ovaries may not be completely effective in preventing it.  Other protective factors include:  Having more than one full-term pregnancy.  Taking birth control pills.  Breastfeeding.  Having a tubal ligation. All of these factors reduce continual ovulation.  BRCA 1 and BRCA 2 blood test screening may be helpful for women with a strong family history for breast, ovary   and colon cancer.  Caregivers should take a thorough family history regarding breast, colon, ovarian and other cancers. Women at high risk should be counseled about the benefits and risks of ovarian cancer screening. Rectovaginal pelvic examinations should be done every year.  No data has shown that screening high-risk women reduces their mortality from ovarian  cancer. Still, the following are recommended in these women until childbearing is completed, or at least by age 35:  Yearly rectovaginal pelvic examinations.  CA-125 determinations (not reliable if normal and can be elevated with other noncancerous conditions such as fibroid tumors, endometriosis, pelvic infections, pregnancy and even during a menstrual period).  Transvaginal ultrasonography. Then preventative removal of both ovaries (prophylactic bilateral oophorectomy) is recommended. A small risk of developing peritoneal carcinomatosis remains following prophylactic oophorectomy.  A Pap test does not help diagnose ovarian cancer. But it is helpful in detecting cervical cancer.  New screening tests are being studied to try to help detect ovarian cancer in its early stages. HOME CARE INSTRUCTIONS   Inform your caregiver if you have had or a family member has had cancer.  Follow the advice of the specialists treating you.  Get a yearly physical and gynecology exams. This includes a rectovaginal pelvic exam, especially for women 40 years old and older even after you had treatment. SEEK MEDICAL CARE IF:   You have any of the symptoms listed above that have not gone away after treating them for a week.  You have back or pelvic pressure or pain.  You have pain during sexual intercourse.  You are losing weight for no known reason.  Your belly (abdomen) is getting bigger.  You have abnormal vaginal bleeding. Document Released: 12/23/2003 Document Revised: 04/26/2011 Document Reviewed: 08/31/2007 [employer] Patient Information 2013 [employer].  

## 2012-05-04 NOTE — Patient Instructions (Addendum)
Doing well.  We will give you IV fluids.  Take Flonase as directed.  Please call us if you have any questions or concerns.

## 2012-05-04 NOTE — Progress Notes (Signed)
Consult Note: Gyn-Onc  [name] [name] 67 y.o. female  CC:  Chief Complaint  Patient presents with  . Ovarian Cancer    Follow up    HPI: Patient is a 67 year old Caucasian female with a past medical history of coronary artery disease status post CABG earlier this year, unfortunately following a few months after CABG 2 of her grafts were occluded, history of breast cancer on chronic tamoxifen therapy who presented to the hospital for evaluation of the above noted complaints. Per patient her shortness of breath and cough had been going on for at least a month if not more. She claimed that her shortness of breath was mostly exertional, and recently it had started to get worse and she could not even walk to the mailbox without stopping multiple times. Unfortunately over the weekend her shortness of breath and cough got significantly worse, she got in touch with her cardiologist today who advised her to come to the emergency room for further evaluation and treatment. In the emergency room she was found to have a large left sided pleural effusion on a chest x-ray. In addition to the shortness of breath she states her several months essentially for the time of her CABG she has had some abdominal discomfort which she has noticed subjective bloating. She felt some of this is related to her bowels. She has noticed progressive early satiety. [name] not really had any significant nausea and vomiting.  She underwent a thoracentesis on December 29, 2011 which revealed malignant cells. The differential considerations for the cytology and the immunophenotype included primary mammary carcinoma in a primary gynecologic carcinoma. In addition, should a paracentesis on November 18 that revealed malignant cells similar to those seen on her this cytology specimens.   CT imaging revealed:  Findings: Bilateral pleural effusions, left greater than right. Associated airspace consolidations. Heart size within normal  limits. Coronary artery calcification.  Unremarkable liver, spleen, pancreas, biliary system, adrenal glands. Symmetric renal enhancement. No hydronephrosis or hydroureter. No bowel obstruction. No CT evidence for colitis. There is a  moderate amount of ascites. Peritoneal carcinomatosis, with the largest conglomerate in the left paracolic gutter, measuring approximately 7.7 x 6.2 cm and displacing the small bowel centrally. No free intraperitoneal air. No lymphadenopathy.  Thin-walled bladder. Uterus not identified. 5.4 cm right adnexal cyst. Enlarged inguinal lymph nodes, measuring up to 13 mm on the left. There is scattered atherosclerotic calcification of the aorta and its branches. No aneurysmal dilatation.  Multilevel degenerative changes without acute osseous finding. Multiple subcutaneous rounded densities are nonspecific and may  reflect sequelae of prior injection sites, however metastases not excluded.   IMPRESSION:  Bilateral pleural effusions and associated consolidations are partially imaged. Peritoneal carcinomatosis and a moderate amount of ascites. 5.4 cm right adnexal cyst is indeterminate. Inguinal lymphadenopathy.   Ultrasound on November 17 revealed:  Uterus: Surgically absent.  Right ovary: Complex cystic right adnexal mass measuring 3.1 x 5.7 x 4.5 cm with surrounding rim of suspected ovarian tissue.  Left ovary: Not visualized transabdominally/transvaginally.  Other findings: Moderate pelvic ascites with internal echoes.  IMPRESSION:  5.7 cm complex cystic right ovarian lesion. Given the associated findings on CT, this appearance is worrisome for primary ovarian neoplasm.  Status post hysterectomy. Left ovary is not discretely visualized. Correlate with surgical history.  We are asked to see her to offer recommendations regarding the treatment of a possible ovarian or intrapelvic serous carcinoma. Her CA 125 is elevated at 392.9. Her CEA is less than 0.5.  In addition her CA  27-29 is in the normal range at 35 but is elevated over her baseline one year ago.    After three cycles of chemotherapy her CT revealed: IMPRESSION:  Trace bilateral pleural effusions with indwelling pleural drains.  Otherwise, no evidence of metastatic disease in the chest.  CT ABDOMEN AND PELVIS  Findings: Peritoneal carcinomatosis overlying the right hepatic dome (series 2/image 52). Liver is otherwise unremarkable.  Spleen, pancreas, and adrenal glands are within normal limits.  Gallbladder is unremarkable. No intrahepatic or extrahepatic ductal dilatation.  Kidneys are within normal limits. No hydronephrosis.  No evidence of bowel obstruction.  Atherosclerotic calcifications of the abdominal aorta and branch vessels.  10 mm short axis retrocaval node (series 2/image 61). Additional small retroperitoneal nodes which do not meet pathologic CT size  criteria. 12 mm right inguinal node (series 2/image 115), previously 10 mm. 15 mm left inguinal node (series 2/image 117),  previously 13 mm.  Near complete resolution of prior abdominopelvic ascites.  Peritoneal carcinomatosis, similar versus mildly improved.  Dominant lesion in the left mid abdomen measures 10.6 x 5.8 cm, previously 12.1 x 6.2 cm when measured in a similar fashion.  Additional omental caking beneath the lower anterior abdominal wall.  7.1 x 4.5 cm complex cystic right ovarian mass (series 2/image 12), previously 6 x 1 x 3.9 cm.  Status post hysterectomy.  Bladder is unremarkable.  Nodularity/thickening in the periumbilical region are worrisome for tumor (series 2/image 82). Additional subcutaneous lesions in the  anterior abdominal wall are nonspecific.  Mild degenerative changes of the visualized thoracolumbar spine.  IMPRESSION:  7.1 x 4.5 cm complex cystic right ovarian mass, mildly increased. Peritoneal carcinomatosis, as described above, stable versus mildly  improved.  Near complete resolution of prior  abdominopelvic ascites.  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  Her CA 125 is also decreased from over 300 at time of diagnosis to 90 in December. We spoke at that time and she decided to proceed with initial 3 cycles of chemotherapy before entertaining of surgery. After her sixth cycle of chemotherapy which was given on March 7 her CT scan revealed Essentially stable disease with no significant improvement. She continues to have extensive omental cake and extensive tumor in the omentum with a similar distribution compared to prior. There is a thickness of left-sided right of tumor stable at 5.4 cm. There were tumor deposits along the left paracolic gutter and cecum were unchanged. The omental tumor deposits similar to prior. The cystic right adnexal mass was unchanged.  Interval History:  She has completed her sixth cycle of chemotherapy with paclitaxel and carboplatin. Her CA 125 has most recently started to rise and is in the 120s up from 90's.   Review of Systems She and her husband with a list of questions today. They want to discuss the results of her CT scan which we did. She states that Dr. Milta Deiters NP had also discussed this with her. Her chemotherapy is going fairly well. She is lethargic about 7-10 days and has bone pain from the Neulasta. She does use ibuprofen Claritin without significant relief. 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 Her shortness of breath is much better her breathing is much better. Her chest tubes were removed. This cycle has been much harder for her than the prior ones were in she's been much been and that this entire week. She is back to using her walker today. She seems to have a bit of a sinus  infection also and she's unsure if that might be contributing to her symptoms. [name] walking writing the exercise bike every week. She does tend to get tired. Her appetite is increased. She feels that her abdominal ascites has decreased overall. She  denies a change about bladder habits. She denies any chest pain. She denies any unintentional weight loss and and if anything has some increased weight gain due to increasing more by mouth intake. She denies any early satiety. The remainder for 10 systems review is negative.  She had an echocardiogram on February 28 showed an ejection fraction of 55-60%. She was seen by her cardiologist Dr. Elease Hashimoto who her per her report stated she was okay for surgery.  Current Meds:  Outpatient Encounter Prescriptions as of 05/04/2012  Medication Sig Dispense Refill  . aspirin 325 MG tablet Take 325 mg by mouth daily.      [name] atorvastatin (LIPITOR) 20 MG tablet Take 20 mg by mouth daily.      . B Complex-C (SUPER B COMPLEX PO) Take 1 tablet by mouth daily.      . carvedilol (COREG) 25 MG tablet Take 1 tablet (25 mg total) by mouth 2 (two) times daily.  180 tablet  3  . Docusate Calcium (STOOL SOFTENER PO) Take 2 capsules by mouth every morning.      [name] omeprazole (PRILOSEC) 20 MG capsule Take 1 capsule (20 mg total) by mouth daily.  30 capsule  6  . oxybutynin (DITROPAN-XL) 10 MG 24 hr tablet Take 10 mg by mouth daily.      Bertram Gala Glycol-Propyl Glycol 0.4-0.3 % SOLN Place 1 drop into both eyes every morning.       . SF 5000 PLUS 1.1 % CREA dental cream Place 1.1 % onto teeth at bedtime.       [name] trimethoprim (TRIMPEX) 100 MG tablet Take 100 mg by mouth daily.      [name] ALPRAZolam (XANAX) 0.25 MG tablet Take 1 tablet (0.25 mg total) by mouth every 8 (eight) hours as needed for anxiety.  30 tablet  0  . Alum & Mag Hydroxide-Simeth (MAGIC MOUTHWASH) SOLN Take 5 mLs by mouth 4 (four) times daily as needed.  240 mL  6  . EPINEPHrine (EPIPEN 2-PAK) 0.3 mg/0.3 mL DEVI Inject 0.3 mg into the muscle.       [name] HYDROcodone-acetaminophen (NORCO/VICODIN) 5-325 MG per tablet Take 1-2 tablets by mouth every 4 (four) hours as needed.  30 tablet  0  . LORazepam (ATIVAN) 0.5 MG tablet Take 1 tablet (0.5 mg total) by mouth every 6  (six) hours as needed for anxiety (Nausea/Vomiting).  60 tablet  0  . nitroGLYCERIN (NITROSTAT) 0.4 MG SL tablet Place 0.4 mg under the tongue every 5 (five) minutes as needed. Chest pain      . ondansetron (ZOFRAN) 8 MG tablet Take 1 tablet (8 mg total) by mouth 2 (two) times daily. Take two times a day starting the day after chemo for 3 days. Then take two times a day as needed for nausea or vomiting.  30 tablet  1  . prochlorperazine (COMPAZINE) 10 MG tablet Take 10 mg by mouth every 6 (six) hours as needed. For nausea      . prochlorperazine (COMPAZINE) 25 MG suppository Place 25 mg rectally every 12 (twelve) hours as needed for nausea.        Facility-Administered Encounter Medications as of 05/04/2012  Medication Dose Route Frequency Provider Last Rate Last Dose  . influenza  inactive virus vaccine (FLUZONE/FLUARIX) injection 0.5 mL  0.5 mL Intramuscular Once Joselyn Arrow, MD        Allergy:  Allergies  Allergen Reactions  . Codeine Nausea And Vomiting       . Isosorbide Other (See Comments)    Extreme headaches  . Other Swelling    All Lip moisturizers except Vaseline.  . Phenothiazines Other (See Comments)    Makes her stop breathing.  . Talwin (Pentazocine) Nausea And Vomiting  . Yellow Jacket Venom Anaphylaxis    Social Hx:   History   Social History  . Marital Status: Married    Spouse Name: N/A    Number of Children: 1  . Years of Education: N/A   Occupational History  . media Geophysicist/field seismologist and ESL coordinator [employer]   Social History Main Topics  . Smoking status: Former Smoker -- 0.12 packs/day for 15 years    Types: Cigarettes    Quit date: 02/15/2001  . Smokeless tobacco: Never Used  . Alcohol Use: 4.2 oz/week    7 Shots of liquor per week     Comment: 12/28/11 "1 gin & tonic q hs", 2/5/141-2 glass a wine occa  . Drug Use: No  . Sexually Active: Yes   Other Topics Concern  . Not on file   Social History Narrative   Lives with husband and  dog    Past Surgical Hx:  Past Surgical History  Procedure Laterality Date  . Breast lumpectomy  04/2009    left  . Cesarean section  1973; 1976  . Muscle release  1960    L neck and chest.; "when I was 13; pneumonia settled in my left neck"  . Coronary artery bypass graft  08/18/2011    Procedure: CORONARY ARTERY BYPASS GRAFTING (CABG);  Surgeon: Alleen Borne, MD;  Location: [facility] OR;  Service: Open Heart Surgery;  Laterality: N/A;  Coronary Artery Bypass Graft times five utilizing the left internal mammary artery and the left greater saphenous vein harvested endoscopically.  . Abdominal hysterectomy  1976  . Appendectomy  1976  . Portacath placement  01/06/2012    Procedure: INSERTION PORT-A-CATH;  Surgeon: Alleen Borne, MD;  Location: [facility] OR;  Service: Thoracic;  Laterality: Left;  . Chest tube insertion  01/06/2012    Procedure: INSERTION PLEURAL DRAINAGE CATHETER;  Surgeon: Alleen Borne, MD;  Location: [facility] OR;  Service: Thoracic;  Laterality: Bilateral;  . Removal of pleural drainage catheter Right 04/12/2012    Procedure: MINOR REMOVAL OF PLEURAL DRAINAGE CATHETER;  Surgeon: Alleen Borne, MD;  Location: [facility] OR;  Service: Thoracic;  Laterality: Right;  . Talc pleurodesis Left 04/12/2012    Procedure: Lurlean Nanny;  Surgeon: Alleen Borne, MD;  Location: [facility] OR;  Service: Thoracic;  Laterality: Left;  . Portacath placement Left 04/17/2012    Procedure: INSERTION PORT-A-CATH;  Surgeon: Alleen Borne, MD;  Location: [facility] OR;  Service: Thoracic;  Laterality: Left;  . Removal of pleural drainage catheter Left 04/17/2012    Procedure: REMOVAL OF PLEURAL DRAINAGE CATHETER;  Surgeon: Alleen Borne, MD;  Location: [facility] OR;  Service: Thoracic;  Laterality: Left;  . Port-a-cath removal Left 04/17/2012    Procedure: REMOVAL PORT-A-CATH;  Surgeon: Alleen Borne, MD;  Location: [facility] OR;  Service: Thoracic;  Laterality: Left;    Past Medical Hx:  Past Medical History  Diagnosis Date  . Interstitial  cystitis     on chronic antibiotics  . Hyperlipidemia   .  Frequent UTI     on prophylaxis  . 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 CAD (coronary artery disease)     a. s/p CABG 7/13;   b.  LHC 12/01/11:  pLAD 70%, mLAD 40%, CFX 40-50% prior to takeoff of the OM2, oRCA occluded, mid vessel filled via R->R collaterals and distal vessel filled by L->R collaterals, S-OM1/OM2 (small and diffusely dz) with mid 90% stenosis, 90% at OM1 anastomotic site, continuation of OM2 occluded, S-Dx patent, S-PDA occluded, L-LAD ok, EF 55-65%  => Med Rx rec.  [name] Hx of echocardiogram     Echo 5/13: EF 60-65%, grade 2 diastolic dysfunction  . Hypercholesterolemia   . Pleural effusion 12/28/11    s/p pleurx catheter  . GERD (gastroesophageal reflux disease)   . Arthritis     "just a little; lower back" (12/28/11)  . Gout attack 08/2011    related to "stress post OHS"  . Depression   . Breast cancer     "left"; on Tamoxifen  . S/P thoracentesis 12/28/11    "for pleural effusion" (12/28/2011)  . Ovarian cancer   . Tachycardia     Family Hx:  Family History  Problem Relation Age of Onset  . Cancer Mother 38    breast cancer  . Heart disease Father 43    MI at 33, CABG in 42's  . Hepatitis Father     C from blood transfusion  . Heart disease Brother     CABG in 31's  . Heart disease Paternal Aunt   . Heart disease Paternal Uncle   . Heart disease Paternal Grandfather   . Diabetes Neg Hx     Vitals:  Blood pressure 110/66, pulse 88, temperature 98.1 F (36.7 C), resp. rate 16, height 5\' 6"  (1.676 m), weight 163 lb 6.4 oz (74.118 kg).  Physical Exam: Well-nourished well-developed female in no acute distress.  Neck: Supple, no lymphadenopathy, no thyromegaly.  Lungs: Clear to auscultation but he.  Prevascular regular rate rhythm.  Abdomen: Well-healed vertical midline incision. The abdomen is somewhat tense and distended. There is no distinct fluid wave. She has 2 small 1 cm nodules around the  umbilicus and 3 additional subcutaneous nodules leading out from the top of her vertical midline incision towards her left side.  Groins: No lymphadenopathy.  Extremities: No edema.  Pelvic: Normal external female genitalia. Bimanual examination reveals approximately a 7-8 cm mass within the pelvis it is smooth. There is no nodularity. Rectal confirms.  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 She is malignant ascites consistent with a gynecologic primary. [name] undergone 6 cycles of paclitaxel and carboplatin based chemotherapy.  Unfortunately she has not responded as much to the last 3 cycles of chemotherapy as she did the first 3. However after discussion she would like to try to be aggressive her proceed with interval cytoreductive surgery. She is tentatively scheduled for surgery either April April 15. She has or will not be hearing [place] that day and we would be Dr. Nelly Rout Dr. Loree Fee. We discussed sending tumor for testing with [facility] in [place] will look into that. The patient was experiencing some dizziness today and she will go see Dr. Welton Flakes for consideration of IV fluids. We have cardiac clearance per Dr. Elease Hashimoto. She understands that she'll need to receive Lovenox.  For surgery she understands will proceed with exploratory  laparotomy TAH/BSO and attempt at optimal cytoreduction. The subcutaneous nodules will also be removed at the time of her surgery. She understands the risks of surgery including bleeding infection injury to surrounding organs thromboembolic disease. She understands she may have a nasogastric tube pending the results of any bowel surgery. Most common risks and benefits were discussed with the patient. Her questions as well as those of her husband were elicited in answer to their satisfaction  [name] [name] A.,  MD 05/04/2012, 11:50 AM

## 2012-05-04 NOTE — Progress Notes (Signed)
OFFICE PROGRESS NOTE  CC KNAPP,[name], MD [address] [place] [address] Dr. Emelia Loron Dr. 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  PRIOR THERAPY: #1. S/P left breast lumpectomy in March 2011 after she had a screen detected 1.0 cm ER+, PR+, intermediate grade DCIS. Patient had 3 sentinel biopsied all of them were negative for metastatic disease. Patient underwent radiation therapy between 06/05/2009 through 07/02/2009. She was then begun on tamoxifen 20 mg daily in August 2011.  #2 new diagnosis of gynecologic malignancy presenting with abdominal mass and pleural effusions. Patient was recently hospitalized with shortness of breath she was discovered to have malignant pleural effusion she is status post Pleurx catheter is. She also had malignant ascites she's had several paracentesis procedures performed during her hospitalization. During her hospitalization she was seen by gynecologic oncology.  #3 patient began neoadjuvant chemotherapy consisting of Taxol and carboplatinum. Her first cycle was administered during her hospitalization. Total of 6 cycles of Taxol and carboplatinum are planned. She has also been seen by gynecologic oncology. She will see them again at completion of 6 cycles. We will also plan on doing restaging scans at that time.   CURRENT THERAPY: Surgery  INTERVAL HISTORY: [name] [name] 67 y.o. female returns for an interim visit.  She was at her pre-op appt with gynecology-oncology when she was c/o dizziness.  We recommended they add on labs and fluids, but their office doesn't have the capability, so I saw [name].  Her dizziness started this morning around 6 am.  She had breakfast at 8 am.  She has had nasal drainage for a few days that she's taking sudafed for.  She is drinking water constantly.  Otherwise a 10 point ROS is neg.   MEDICAL HISTORY: Past Medical History   Diagnosis Date  . Interstitial cystitis     on chronic antibiotics  . Hyperlipidemia   . Frequent UTI     on prophylaxis  . 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 CAD (coronary artery disease)     a. s/p CABG 7/13;   b.  LHC 12/01/11:  pLAD 70%, mLAD 40%, CFX 40-50% prior to takeoff of the OM2, oRCA occluded, mid vessel filled via R->R collaterals and distal vessel filled by L->R collaterals, S-OM1/OM2 (small and diffusely dz) with mid 90% stenosis, 90% at OM1 anastomotic site, continuation of OM2 occluded, S-Dx patent, S-PDA occluded, L-LAD ok, EF 55-65%  => Med Rx rec.  [name] Hx of echocardiogram     Echo 5/13: EF 60-65%, grade 2 diastolic dysfunction  . Hypercholesterolemia   . Pleural effusion 12/28/11    s/p pleurx catheter  . GERD (gastroesophageal reflux disease)   . Arthritis     "just a little; lower back" (12/28/11)  . Gout attack 08/2011    related to "stress post OHS"  . Depression   . Breast cancer     "left"; on Tamoxifen  . S/P thoracentesis 12/28/11    "for pleural effusion" (12/28/2011)  . Ovarian cancer   . Tachycardia     ALLERGIES:  is allergic to codeine; isosorbide; other; phenothiazines; talwin; and yellow jacket venom.  MEDICATIONS:  Current Outpatient Prescriptions  Medication Sig Dispense Refill  . ALPRAZolam (XANAX) 0.25 MG tablet Take 1 tablet (0.25 mg total) by mouth every 8 (eight) hours as needed for anxiety.  30 tablet  0  . Alum & Mag Hydroxide-Simeth (MAGIC MOUTHWASH) SOLN Take  5 mLs by mouth 4 (four) times daily as needed.  240 mL  6  . aspirin 325 MG tablet Take 325 mg by mouth daily.      [name] atorvastatin (LIPITOR) 20 MG tablet Take 20 mg by mouth daily.      . B Complex-C (SUPER B COMPLEX PO) Take 1 tablet by mouth daily.      . carvedilol (COREG) 25 MG tablet Take 1 tablet (25 mg total) by mouth 2 (two) times daily.  180 tablet  3  . Docusate Calcium (STOOL SOFTENER PO) Take 2 capsules by mouth every morning.      [name] EPINEPHrine (EPIPEN 2-PAK) 0.3  mg/0.3 mL DEVI Inject 0.3 mg into the muscle.       [name] HYDROcodone-acetaminophen (NORCO/VICODIN) 5-325 MG per tablet Take 1-2 tablets by mouth every 4 (four) hours as needed.  30 tablet  0  . LORazepam (ATIVAN) 0.5 MG tablet Take 1 tablet (0.5 mg total) by mouth every 6 (six) hours as needed for anxiety (Nausea/Vomiting).  60 tablet  0  . nitroGLYCERIN (NITROSTAT) 0.4 MG SL tablet Place 0.4 mg under the tongue every 5 (five) minutes as needed. Chest pain      . omeprazole (PRILOSEC) 20 MG capsule Take 1 capsule (20 mg total) by mouth daily.  30 capsule  6  . ondansetron (ZOFRAN) 8 MG tablet Take 1 tablet (8 mg total) by mouth 2 (two) times daily. Take two times a day starting the day after chemo for 3 days. Then take two times a day as needed for nausea or vomiting.  30 tablet  1  . oxybutynin (DITROPAN-XL) 10 MG 24 hr tablet Take 10 mg by mouth daily.      Bertram Gala Glycol-Propyl Glycol 0.4-0.3 % SOLN Place 1 drop into both eyes every morning.       . SF 5000 PLUS 1.1 % CREA dental cream Place 1.1 % onto teeth at bedtime.       [name] trimethoprim (TRIMPEX) 100 MG tablet Take 100 mg by mouth daily.      . fluticasone (FLONASE) 50 MCG/ACT nasal spray Place 2 sprays into the nose daily.  16 g  2  . prochlorperazine (COMPAZINE) 10 MG tablet Take 10 mg by mouth every 6 (six) hours as needed. For nausea      . prochlorperazine (COMPAZINE) 25 MG suppository Place 25 mg rectally every 12 (twelve) hours as needed for nausea.        Current Facility-Administered Medications  Medication Dose Route Frequency Provider Last Rate Last Dose  . 0.9 %  sodium chloride infusion  1,000 mL Intravenous Once Augustin Schooling, NP 250 mL/hr at 05/04/12 1258 1,000 mL at 05/04/12 1258   Facility-Administered Medications Ordered in Other Visits  Medication Dose Route Frequency Provider Last Rate Last Dose  . influenza  inactive virus vaccine (FLUZONE/FLUARIX) injection 0.5 mL  0.5 mL Intramuscular Once Joselyn Arrow, MD         SURGICAL HISTORY:  Past Surgical History  Procedure Laterality Date  . Breast lumpectomy  04/2009    left  . Cesarean section  1973; 1976  . Muscle release  1960    L neck and chest.; "when I was 13; pneumonia settled in my left neck"  . Coronary artery bypass graft  08/18/2011    Procedure: CORONARY ARTERY BYPASS GRAFTING (CABG);  Surgeon: Alleen Borne, MD;  Location: [facility] OR;  Service: Open Heart Surgery;  Laterality: N/A;  Coronary Artery Bypass  Graft times five utilizing the left internal mammary artery and the left greater saphenous vein harvested endoscopically.  . Abdominal hysterectomy  1976  . Appendectomy  1976  . Portacath placement  01/06/2012    Procedure: INSERTION PORT-A-CATH;  Surgeon: Alleen Borne, MD;  Location: [facility] OR;  Service: Thoracic;  Laterality: Left;  . Chest tube insertion  01/06/2012    Procedure: INSERTION PLEURAL DRAINAGE CATHETER;  Surgeon: Alleen Borne, MD;  Location: [facility] OR;  Service: Thoracic;  Laterality: Bilateral;  . Removal of pleural drainage catheter Right 04/12/2012    Procedure: MINOR REMOVAL OF PLEURAL DRAINAGE CATHETER;  Surgeon: Alleen Borne, MD;  Location: [facility] OR;  Service: Thoracic;  Laterality: Right;  . Talc pleurodesis Left 04/12/2012    Procedure: Lurlean Nanny;  Surgeon: Alleen Borne, MD;  Location: [facility] OR;  Service: Thoracic;  Laterality: Left;  . Portacath placement Left 04/17/2012    Procedure: INSERTION PORT-A-CATH;  Surgeon: Alleen Borne, MD;  Location: [facility] OR;  Service: Thoracic;  Laterality: Left;  . Removal of pleural drainage catheter Left 04/17/2012    Procedure: REMOVAL OF PLEURAL DRAINAGE CATHETER;  Surgeon: Alleen Borne, MD;  Location: [facility] OR;  Service: Thoracic;  Laterality: Left;  . Port-a-cath removal Left 04/17/2012    Procedure: REMOVAL PORT-A-CATH;  Surgeon: Alleen Borne, MD;  Location: [facility] OR;  Service: Thoracic;  Laterality: Left;    REVIEW OF SYSTEMS:   General: fatigue (+), night sweats (-), fever (-), pain  (-) Lymph: palpable nodes (-) HEENT: vision changes (-), mucositis (-), gum bleeding (-), epistaxis (-) Cardiovascular: chest pain (-), palpitations (-) Pulmonary: shortness of breath (-), dyspnea on exertion (-), cough (-), hemoptysis (-) GI:  Early satiety (-), melena (-), dysphagia (-), nausea/vomiting (-), diarrhea (-) GU: dysuria (-), hematuria (-), incontinence (-) Musculoskeletal: joint swelling (-), joint pain (-), back pain (-) Neuro: weakness (-), numbness (+), headache (-), confusion (-) Skin: Rash (-), lesions (-), dryness (-) Psych: depression (-), suicidal/homicidal ideation (-), feeling of hopelessness (-)   PHYSICAL EXAMINATION:  BP 111/71  Pulse 95  BP lying 112/74, HR 94, BP sitting 116/75, HR 95, BP standing 115/73, HR 105 General: Patient is an ill appearing female in no acute distress HEENT: PERRLA, sclerae anicteric no conjunctival pallor, MMM, fluid in tympanic membranes Neck: supple, no palpable adenopathy Lungs: clear to auscultation bilaterally, no wheezes, rhonchi, or rales Cardiovascular: regular rate rhythm, S1, S2, no murmurs, rubs or gallops Abdomen: Soft, non-tender, non-distended, normoactive bowel sounds, no HSM, pleurx dressings clean dry and intact Extremities: warm and well perfused, no clubbing, cyanosis, or edema Skin: No rashes or lesions Neuro: Non-focal ECOG PERFORMANCE STATUS: 0 - Asymptomatic  LABORATORY DATA: Lab Results  Component Value Date   WBC 10.4* 05/04/2012   HGB 9.2* 05/04/2012   HCT 27.6* 05/04/2012   MCV 100.4 05/04/2012   PLT 237 05/04/2012      Chemistry      Component Value Date/Time   NA 136 04/28/2012 0849   NA 139 04/17/2012 0624   NA 141 12/17/2011 1144   K 3.4* 04/28/2012 0849   K 3.6 04/17/2012 0624   CL 100 04/28/2012 0849   CL 101 04/17/2012 0624   CO2 21* 04/28/2012 0849   CO2 27 04/17/2012 0624   BUN 21.5 04/28/2012 0849   BUN 18 04/17/2012 0624   BUN 15 12/17/2011 1144   CREATININE 0.7 04/28/2012 0849   CREATININE  0.56 04/17/2012 1610  Component Value Date/Time   CALCIUM 9.0 04/28/2012 0849   CALCIUM 9.3 04/17/2012 0624   ALKPHOS 109 04/28/2012 0849   ALKPHOS 78 04/17/2012 0624   AST 18 04/28/2012 0849   AST 19 04/17/2012 0624   ALT 48 04/28/2012 0849   ALT 35 04/17/2012 0624   BILITOT 0.35 04/28/2012 0849   BILITOT 0.3 04/17/2012 0624       RADIOGRAPHIC STUDIES:  No results found.  ASSESSMENT: 67 year old female with:  #1 DCIS of the breast. She is on tamoxifen 20 mg daily. However I have recommended that we discontinue the tamoxifen for now a do to her recent diagnosis of ovarian cancer.  #2 recent diagnosis of gynecologic malignancy this is ovarian carcinoma). Patient is currently receiving neoadjuvant chemotherapy consisting of Taxol and carboplatinum. Overall she is tolerating it well. Recent CT performed on January 8 showed a minute response to chemotherapy. Total of 6 cycles of Taxol and carboplatinum are planned. After that she will be seen back by Dr. Grant Ruts for ovarian surgery.  #3 Neuropathy  #4 Dizziness  PLAN:  #1 Ms. Mandile is doing well after chemotherapy. Her scans show stable disease.  She had f/u with Dr. Rica Records today.   #2 She is slightly orthostatic, and symptomatic.  We will give her IV fluids, check a BMP and CBC, and I prescribed Flonase for her sinuses.    All questions were answered. The patient knows to call the clinic with any problems, questions or concerns. We can certainly see the patient much sooner if necessary.  I spent 25 minutes counseling the patient face to face. The total time spent in the appointment was 30 minutes.  Cherie Ouch Lyn Hollingshead, NP Medical Oncology [facility] Phone: [phone] 05/04/2012, 12:58 PM

## 2012-05-17 NOTE — Op Note (Deleted)
NAME:  [name], [name]         ACCOUNT NO.:  [id number]  MEDICAL RECORD NO.:  [id number]  LOCATION:  GYN                          FACILITY:  [facility]  PHYSICIAN:  Daysy Santini, M.D.     DATE OF BIRTH:  12/24/1945  DATE OF PROCEDURE:  04/17/2012 DATE OF DISCHARGE:  04/17/2012                              OPERATIVE REPORT   PREOPERATIVE DIAGNOSIS:  Malignant pleural effusion.  POSTOPERATIVE DIAGNOSIS:  Malignant pleural effusion.  OPERATIVE PROCEDURE: 1. Removal of old left subclavian Port-A-Cath. 2. Insertion of new left subclavian Port-A-Cath. 3. Removal of left PleurX catheter.  ATTENDING SURGEON:  Abigayl Hor, M.D.  ANESTHESIA:  General endotracheal.  CLINICAL HISTORY:  This patient is a 66-year-old woman with metastatic ovarian carcinoma who developed malignant pleural effusions and had bilateral PleurX catheters as well as insertion of a left subclavian Port-A-Cath for chemotherapy.  The right Pleurx drainage catheter had already been removed.  Her Port-A-Cath had become unreliable with difficulty accessing the catheter and getting adequate blood return to use the catheter.  It was felt that she required a new left subclavian Port-A-Cath for chemotherapy.  I discussed the operative procedure with the patient and her husband including alternatives, benefits, and risks including, but not limited to bleeding, infection, injury to subclavian vein, pneumothorax, malfunction of the new Port-A-Cath, subclavian vein thrombosis, and recurrence of the left pleural effusion.  She understood and agreed to proceed.  OPERATIVE PROCEDURE:  The patient was seen in the preoperative holding area and the proper patient, proper operation were confirmed with nursing and anesthesia staff.  The left side of the chest was signed by me.  She was taken back to the operating room and placed on the table in supine position.  Preoperative intravenous antibiotics were given. After adequate  intravenous sedation, the left side of the chest was prepped with Betadine, soap, and solution, draped in usual sterile manner.  Time-out was taken.  Proper patient, proper operation were confirmed with nursing and anesthesia staff.  Then, 1% lidocaine local anesthesia was used to anesthetize the left infraclavicular incision, which had already healed.  Then, the incision was opened using a scalpel.  Dissection was continued down through the subcutaneous tissue using electrocautery.  The Port-A-Cath pocket was entered.  Two silk sutures between the port and pectoralis fascia were divided.  The Port-A- Cath was removed from the pocket.  Then, the catheter was withdrawn slightly and was transected.  A guidewire was passed through the middle of the catheter into the central venous system and its position confirmed in the superior vena cava using fluoroscopy.  The catheter was then removed in entirety.  Then, a new 10-French introducer and sheath was inserted over the guidewire and advanced under fluoroscopic guidance.  The guidewire and introducer were removed and a new 9.6- French single-lumen power port was inserted through the sheath and advanced so the tip was lying in the superior vena cava.  The sheath was removed.  There was excellent blood return through the port and was flushed with heparinized saline solution.  It was then flushed with heparin solution at 1000 units/mL and I used about 4 mL.  Then, the port was positioned back into the subcutaneous pocket and   fixed to the pectoral fascia using 2-0 silk sutures.  The subcutaneous tissue was then closed using continuous 3-0 Vicryl suture and the skin with a 3-0 Vicryl subcuticular closure.  Dermabond was applied over the incision followed by a dry dressing.  Sponge, needle, and instrument counts were correct according to the scrub nurse.  Then, the left PleurX catheter was removed.  Lidocaine 1% local anesthesia were used to  anesthetize the skin and subcutaneous tissue around the catheter exit site.  Hemostat was used to bluntly mobilize the subcutaneous cuff.  Then, the catheter was removed in entirety.  Dry sterile dressing was applied over the exit site.  The patient tolerated the procedure well and was transferred to the post anesthesia care unit in satisfactory and stable condition.     Samy Ryner, M.D.     BB/MEDQ  D:  05/16/2012  T:  05/17/2012  Job:  [id number] 

## 2012-05-17 NOTE — Op Note (Signed)
NAME:  [name] [name], SPADACCINI         ACCOUNT NO.:  [id number]  MEDICAL RECORD NO.:  [id number]  LOCATION:  GYN                          FACILITY:  [facility]  PHYSICIAN:  Evelene Croon, M.D.     DATE OF BIRTH:  1945-04-12  DATE OF PROCEDURE:  04/17/2012 DATE OF DISCHARGE:  04/17/2012                              OPERATIVE REPORT   PREOPERATIVE DIAGNOSIS:  Malignant pleural effusion.  POSTOPERATIVE DIAGNOSIS:  Malignant pleural effusion.  OPERATIVE PROCEDURE: 1. Removal of old left subclavian Port-A-Cath. 2. Insertion of new left subclavian Port-A-Cath. 3. Removal of left PleurX catheter.  ATTENDING SURGEON:  Evelene Croon, M.D.  ANESTHESIA:  General endotracheal.  CLINICAL HISTORY:  This patient is a 67 year old woman with metastatic ovarian carcinoma who developed malignant pleural effusions and had bilateral PleurX catheters as well as insertion of a left subclavian Port-A-Cath for chemotherapy.  The right Pleurx drainage catheter had already been removed.  Her Port-A-Cath had become unreliable with difficulty accessing the catheter and getting adequate blood return to use the catheter.  It was felt that she required a new left subclavian Port-A-Cath for chemotherapy.  I discussed the operative procedure with the patient and her husband including alternatives, benefits, and risks including, but not limited to bleeding, infection, injury to subclavian vein, pneumothorax, malfunction of the new Port-A-Cath, subclavian vein thrombosis, and recurrence of the left pleural effusion.  She understood and agreed to proceed.  OPERATIVE PROCEDURE:  The patient was seen in the preoperative holding area and the proper patient, proper operation were confirmed with nursing and anesthesia staff.  The left side of the chest was signed by me.  She was taken back to the operating room and placed on the table in supine position.  Preoperative intravenous antibiotics were given. After adequate  intravenous sedation, the left side of the chest was prepped with Betadine, soap, and solution, draped in usual sterile manner.  Time-out was taken.  Proper patient, proper operation were confirmed with nursing and anesthesia staff.  Then, 1% lidocaine local anesthesia was used to anesthetize the left infraclavicular incision, which had already healed.  Then, the incision was opened using a scalpel.  Dissection was continued down through the subcutaneous tissue using electrocautery.  The Port-A-Cath pocket was entered.  Two silk sutures between the port and pectoralis fascia were divided.  The Port-A- Cath was removed from the pocket.  Then, the catheter was withdrawn slightly and was transected.  A guidewire was passed through the middle of the catheter into the central venous system and its position confirmed in the superior vena cava using fluoroscopy.  The catheter was then removed in entirety.  Then, a new 10-French introducer and sheath was inserted over the guidewire and advanced under fluoroscopic guidance.  The guidewire and introducer were removed and a new 9.6- French single-lumen power port was inserted through the sheath and advanced so the tip was lying in the superior vena cava.  The sheath was removed.  There was excellent blood return through the port and was flushed with heparinized saline solution.  It was then flushed with heparin solution at 1000 units/mL and I used about 4 mL.  Then, the port was positioned back into the subcutaneous pocket and  fixed to the pectoral fascia using 2-0 silk sutures.  The subcutaneous tissue was then closed using continuous 3-0 Vicryl suture and the skin with a 3-0 Vicryl subcuticular closure.  Dermabond was applied over the incision followed by a dry dressing.  Sponge, needle, and instrument counts were correct according to the scrub nurse.  Then, the left PleurX catheter was removed.  Lidocaine 1% local anesthesia were used to  anesthetize the skin and subcutaneous tissue around the catheter exit site.  Hemostat was used to bluntly mobilize the subcutaneous cuff.  Then, the catheter was removed in entirety.  Dry sterile dressing was applied over the exit site.  The patient tolerated the procedure well and was transferred to the post anesthesia care unit in satisfactory and stable condition.     Evelene Croon, M.D.     BB/MEDQ  D:  05/16/2012  T:  05/17/2012  Job:  [id number]

## 2012-05-19 ENCOUNTER — Encounter (HOSPITAL_COMMUNITY): Payer: Self-pay | Admitting: Pharmacy Technician

## 2012-05-26 ENCOUNTER — Encounter (HOSPITAL_COMMUNITY): Payer: Self-pay

## 2012-05-26 ENCOUNTER — Encounter (HOSPITAL_COMMUNITY)
Admission: RE | Admit: 2012-05-26 | Discharge: 2012-05-26 | Disposition: A | Discharge status: Home / Self Care | Payer: BC Managed Care – PPO | Source: Ambulatory Visit | Attending: Obstetrics & Gynecology | Admitting: Obstetrics & Gynecology

## 2012-05-26 ENCOUNTER — Ambulatory Visit (HOSPITAL_COMMUNITY)
Admission: RE | Admit: 2012-05-26 | Discharge: 2012-05-26 | Disposition: A | Discharge status: Home / Self Care | Payer: BC Managed Care – PPO | Source: Ambulatory Visit | Attending: Gynecology | Admitting: Gynecology

## 2012-05-26 DIAGNOSIS — I10 Essential (primary) hypertension: Secondary | ICD-10-CM | POA: Insufficient documentation

## 2012-05-26 DIAGNOSIS — Z01818 Encounter for other preprocedural examination: Secondary | ICD-10-CM | POA: Insufficient documentation

## 2012-05-26 DIAGNOSIS — Z951 Presence of aortocoronary bypass graft: Secondary | ICD-10-CM | POA: Insufficient documentation

## 2012-05-26 DIAGNOSIS — J90 Pleural effusion, not elsewhere classified: Secondary | ICD-10-CM | POA: Insufficient documentation

## 2012-05-26 DIAGNOSIS — I509 Heart failure, unspecified: Secondary | ICD-10-CM | POA: Insufficient documentation

## 2012-05-26 DIAGNOSIS — Z01812 Encounter for preprocedural laboratory examination: Secondary | ICD-10-CM | POA: Insufficient documentation

## 2012-05-26 DIAGNOSIS — C569 Malignant neoplasm of unspecified ovary: Secondary | ICD-10-CM | POA: Insufficient documentation

## 2012-05-26 HISTORY — DX: Myoneural disorder, unspecified: G70.9

## 2012-05-26 LAB — CBC WITH DIFFERENTIAL/PLATELET
Basophils Relative: 0 % (ref 0–1)
HCT: 31.4 % — ABNORMAL LOW (ref 36.0–46.0)
Hemoglobin: 9.6 g/dL — ABNORMAL LOW (ref 12.0–15.0)
Lymphocytes Relative: 12 % (ref 12–46)
Lymphs Abs: 0.8 10*3/uL (ref 0.7–4.0)
Monocytes Absolute: 0.6 10*3/uL (ref 0.1–1.0)
Monocytes Relative: 9 % (ref 3–12)
Neutro Abs: 5.5 10*3/uL (ref 1.7–7.7)
Neutrophils Relative %: 77 % (ref 43–77)
RBC: 3.08 MIL/uL — ABNORMAL LOW (ref 3.87–5.11)
WBC: 7.1 10*3/uL (ref 4.0–10.5)

## 2012-05-26 LAB — COMPREHENSIVE METABOLIC PANEL
Albumin: 3 g/dL — ABNORMAL LOW (ref 3.5–5.2)
Alkaline Phosphatase: 85 U/L (ref 39–117)
BUN: 11 mg/dL (ref 6–23)
CO2: 25 mEq/L (ref 19–32)
Chloride: 100 mEq/L (ref 96–112)
Creatinine, Ser: 0.57 mg/dL (ref 0.50–1.10)
GFR calc non Af Amer: >90 mL/min (ref >90)
Potassium: 4 mEq/L (ref 3.5–5.1)
Total Bilirubin: 0.3 mg/dL (ref 0.3–1.2)

## 2012-05-26 LAB — SURGICAL PCR SCREEN
MRSA, PCR: NEGATIVE
Staphylococcus aureus: NEGATIVE

## 2012-05-26 NOTE — Progress Notes (Signed)
eccho 2/14, Ekg 3/14 with OV Dr Melburn Popper in [facility], denies any chest pain, OV Dr Laneta Simmers 05/01/12 EPIC. Patient states respiratory status is unchanged and has no tubes or drains in at this time but left sided porta cath.  Spoke with Dr Renold Don, anesthesia, who will see patient AM of surgery.  Spoke with Damian Leavell in GYN oncology and informed her patient does not have bowel prep-  States will bring her one.  1245 notified N Wilkerson of abnormal HGB, HCT and chest x ray report

## 2012-05-26 NOTE — Patient Instructions (Addendum)
20 [name] [name]  05/26/2012   Your procedure is scheduled on:  05/30/12  TUESDAY  Report to [facility] at    0715   AM.  Call this number if you have problems the morning of surgery: [phone]       Remember:   Do not eat food:After Midnight.  Sunday NIGHT      BEGIN CLEAR LIQUIDS Monday MORNING AS DIRECTED BY OFFICE FOR BOWEL PREP-  INCREASE FLUIDS ALL DAY Monday-  DRINK UNTIL MIDNIGHT OR BEDTIME,  THEN NOTHING BY MOUTH AFTER MIDNIGHT   Take these medicines the morning of surgery with A SIP OF WATER:  COREG,  PROLISEC May take Xanax, Norco or Nitrostat if needed  .  Contacts, dentures or partial plates can not be worn to surgery  Leave suitcase in the car. After surgery it may be brought to your room.  For patients admitted to the hospital, checkout time is 11:00 AM day of  discharge.             SPECIAL INSTRUCTIONS- SEE [facility] PREPARING FOR SURGERY INSTRUCTION SHEET-     DO NOT WEAR JEWELRY, LOTIONS, POWDERS, OR PERFUMES.  WOMEN-- DO NOT SHAVE LEGS OR UNDERARMS FOR 12 HOURS BEFORE SHOWERS. MEN MAY SHAVE FACE.  Patients discharged the day of surgery will not be allowed to drive home. IF going home the day of surgery, you must have a driver and someone to stay with you for the first 24 hours  Name and phone number of your driver:                                                                        Please read over the following fact sheets that you were given: MRSA Information, Incentive Spirometry Sheet, Blood Transfusion Sheet  Information                                                                                   Lamario Mani  PST 336  [phone]                 FAILURE TO FOLLOW THESE INSTRUCTIONS MAY RESULT IN  CANCELLATION   OF YOUR SURGERY                                                  Patient Signature _____________________________

## 2012-05-30 ENCOUNTER — Encounter (HOSPITAL_COMMUNITY): Payer: Self-pay | Admitting: Anesthesiology

## 2012-05-30 ENCOUNTER — Encounter (HOSPITAL_COMMUNITY): Payer: Self-pay | Admitting: *Deleted

## 2012-05-30 ENCOUNTER — Inpatient Hospital Stay (HOSPITAL_COMMUNITY)
Admission: RE | Admit: 2012-05-30 | Discharge: 2012-06-04 | DRG: 365 | Disposition: A | Discharge status: Home / Self Care | Payer: BC Managed Care – PPO | Source: Ambulatory Visit | Attending: Obstetrics & Gynecology | Admitting: Obstetrics & Gynecology

## 2012-05-30 ENCOUNTER — Inpatient Hospital Stay (HOSPITAL_COMMUNITY): Payer: BC Managed Care – PPO | Admitting: Anesthesiology

## 2012-05-30 ENCOUNTER — Encounter (HOSPITAL_COMMUNITY)
Admission: RE | Disposition: A | Discharge status: Home / Self Care | Payer: Self-pay | Source: Ambulatory Visit | Attending: Obstetrics & Gynecology

## 2012-05-30 DIAGNOSIS — K219 Gastro-esophageal reflux disease without esophagitis: Secondary | ICD-10-CM | POA: Diagnosis present

## 2012-05-30 DIAGNOSIS — R011 Cardiac murmur, unspecified: Secondary | ICD-10-CM | POA: Diagnosis present

## 2012-05-30 DIAGNOSIS — C786 Secondary malignant neoplasm of retroperitoneum and peritoneum: Secondary | ICD-10-CM | POA: Diagnosis present

## 2012-05-30 DIAGNOSIS — E785 Hyperlipidemia, unspecified: Secondary | ICD-10-CM | POA: Diagnosis present

## 2012-05-30 DIAGNOSIS — E7800 Pure hypercholesterolemia, unspecified: Secondary | ICD-10-CM | POA: Diagnosis present

## 2012-05-30 DIAGNOSIS — R197 Diarrhea, unspecified: Secondary | ICD-10-CM | POA: Diagnosis not present

## 2012-05-30 DIAGNOSIS — Z951 Presence of aortocoronary bypass graft: Secondary | ICD-10-CM

## 2012-05-30 DIAGNOSIS — I2510 Atherosclerotic heart disease of native coronary artery without angina pectoris: Secondary | ICD-10-CM | POA: Diagnosis present

## 2012-05-30 DIAGNOSIS — Z7982 Long term (current) use of aspirin: Secondary | ICD-10-CM

## 2012-05-30 DIAGNOSIS — N3010 Interstitial cystitis (chronic) without hematuria: Secondary | ICD-10-CM | POA: Diagnosis present

## 2012-05-30 DIAGNOSIS — C569 Malignant neoplasm of unspecified ovary: Principal | ICD-10-CM

## 2012-05-30 DIAGNOSIS — R159 Full incontinence of feces: Secondary | ICD-10-CM | POA: Diagnosis not present

## 2012-05-30 DIAGNOSIS — Z87891 Personal history of nicotine dependence: Secondary | ICD-10-CM

## 2012-05-30 DIAGNOSIS — Z79899 Other long term (current) drug therapy: Secondary | ICD-10-CM

## 2012-05-30 HISTORY — PX: LAPAROTOMY: SHX154

## 2012-05-30 SURGERY — LAPAROTOMY, EXPLORATORY
Anesthesia: General | Laterality: Bilateral | Wound class: Clean Contaminated

## 2012-05-30 MED ORDER — ACETAMINOPHEN 10 MG/ML IV SOLN
INTRAVENOUS | Status: DC | PRN
  Administered 2012-05-30: 1000 mg via INTRAVENOUS

## 2012-05-30 MED ORDER — LACTATED RINGERS IV SOLN
INTRAVENOUS | Status: DC
  Administered 2012-05-30: 1000 mL via INTRAVENOUS

## 2012-05-30 MED ORDER — DIPHENHYDRAMINE HCL 12.5 MG/5ML PO ELIX: 12.5000 mg | ORAL_SOLUTION | Freq: Four times a day (QID) | ORAL | Status: DC | PRN

## 2012-05-30 MED ORDER — MIDAZOLAM HCL 5 MG/5ML IJ SOLN
INTRAMUSCULAR | Status: DC | PRN
  Administered 2012-05-30 (×2): 1 mg via INTRAVENOUS

## 2012-05-30 MED ORDER — ONDANSETRON HCL 4 MG PO TABS: 4.0000 mg | ORAL_TABLET | Freq: Four times a day (QID) | ORAL | Status: DC | PRN

## 2012-05-30 MED ORDER — PROMETHAZINE HCL 25 MG/ML IJ SOLN: 6.2500 mg | INTRAMUSCULAR | Status: DC | PRN

## 2012-05-30 MED ORDER — LIDOCAINE HCL (CARDIAC) 20 MG/ML IV SOLN
INTRAVENOUS | Status: DC | PRN
  Administered 2012-05-30: 50 mg via INTRAVENOUS

## 2012-05-30 MED ORDER — NEOSTIGMINE METHYLSULFATE 1 MG/ML IJ SOLN
INTRAMUSCULAR | Status: DC | PRN
  Administered 2012-05-30: 3 mg via INTRAVENOUS

## 2012-05-30 MED ORDER — KETOROLAC TROMETHAMINE 30 MG/ML IJ SOLN
30.0000 mg | Freq: Four times a day (QID) | INTRAMUSCULAR | Status: AC
  Filled 2012-05-30 (×17): qty 1

## 2012-05-30 MED ORDER — ATORVASTATIN CALCIUM 20 MG PO TABS
20.0000 mg | ORAL_TABLET | Freq: Every day | ORAL | Status: DC
  Administered 2012-05-30 – 2012-06-03 (×5): 20 mg via ORAL
  Filled 2012-05-30 (×7): qty 1

## 2012-05-30 MED ORDER — PHENYLEPHRINE HCL 10 MG/ML IJ SOLN
INTRAMUSCULAR | Status: DC | PRN
  Administered 2012-05-30 (×2): 40 ug via INTRAVENOUS

## 2012-05-30 MED ORDER — METOCLOPRAMIDE HCL 5 MG/ML IJ SOLN
INTRAMUSCULAR | Status: DC | PRN
  Administered 2012-05-30 (×2): 10 mg via INTRAVENOUS

## 2012-05-30 MED ORDER — ENOXAPARIN SODIUM 40 MG/0.4ML ~~LOC~~ SOLN
40.0000 mg | SUBCUTANEOUS | Status: DC
  Administered 2012-05-31 – 2012-06-04 (×5): 40 mg via SUBCUTANEOUS
  Filled 2012-05-30 (×6): qty 0.4

## 2012-05-30 MED ORDER — HYDROMORPHONE HCL PF 1 MG/ML IJ SOLN
INTRAMUSCULAR | Status: DC | PRN
  Administered 2012-05-30 (×3): 1 mg via INTRAVENOUS

## 2012-05-30 MED ORDER — ONDANSETRON HCL 4 MG/2ML IJ SOLN
INTRAMUSCULAR | Status: DC | PRN
  Administered 2012-05-30: 4 mg via INTRAVENOUS

## 2012-05-30 MED ORDER — ASPIRIN 325 MG PO TABS
325.0000 mg | ORAL_TABLET | Freq: Every day | ORAL | Status: DC
  Administered 2012-05-30 – 2012-06-03 (×5): 325 mg via ORAL
  Filled 2012-05-30 (×6): qty 1

## 2012-05-30 MED ORDER — ONDANSETRON HCL 4 MG/2ML IJ SOLN
4.0000 mg | Freq: Four times a day (QID) | INTRAMUSCULAR | Status: DC | PRN
  Administered 2012-05-31 (×2): 4 mg via INTRAVENOUS

## 2012-05-30 MED ORDER — KCL IN DEXTROSE-NACL 20-5-0.45 MEQ/L-%-% IV SOLN
INTRAVENOUS | Status: DC
  Administered 2012-05-30: 125 mL via INTRAVENOUS
  Administered 2012-05-30 – 2012-06-02 (×6): via INTRAVENOUS
  Filled 2012-05-30 (×10): qty 1000

## 2012-05-30 MED ORDER — CEFAZOLIN SODIUM-DEXTROSE 2-3 GM-% IV SOLR
2.0000 g | INTRAVENOUS | Status: AC
  Administered 2012-05-30: 2 g via INTRAVENOUS

## 2012-05-30 MED ORDER — PROPOFOL 10 MG/ML IV BOLUS
INTRAVENOUS | Status: DC | PRN
  Administered 2012-05-30: 150 mg via INTRAVENOUS

## 2012-05-30 MED ORDER — KETOROLAC TROMETHAMINE 30 MG/ML IJ SOLN
15.0000 mg | Freq: Once | INTRAMUSCULAR | Status: AC | PRN
  Administered 2012-05-30: 30 mg via INTRAVENOUS

## 2012-05-30 MED ORDER — KETOROLAC TROMETHAMINE 30 MG/ML IJ SOLN
15.0000 mg | Freq: Four times a day (QID) | INTRAMUSCULAR | Status: AC
  Administered 2012-05-30 – 2012-06-04 (×17): 15 mg via INTRAVENOUS
  Filled 2012-05-30 (×24): qty 1

## 2012-05-30 MED ORDER — DEXAMETHASONE SODIUM PHOSPHATE 10 MG/ML IJ SOLN
INTRAMUSCULAR | Status: DC | PRN
  Administered 2012-05-30: 10 mg via INTRAVENOUS

## 2012-05-30 MED ORDER — NALOXONE HCL 0.4 MG/ML IJ SOLN: 0.4000 mg | INTRAMUSCULAR | Status: DC | PRN

## 2012-05-30 MED ORDER — CARVEDILOL 25 MG PO TABS
25.0000 mg | ORAL_TABLET | Freq: Two times a day (BID) | ORAL | Status: DC
  Administered 2012-05-30 – 2012-06-04 (×9): 25 mg via ORAL
  Filled 2012-05-30 (×12): qty 1

## 2012-05-30 MED ORDER — OXYCODONE-ACETAMINOPHEN 5-325 MG PO TABS: 1.0000 | ORAL_TABLET | ORAL | Status: DC | PRN

## 2012-05-30 MED ORDER — 0.9 % SODIUM CHLORIDE (POUR BTL) OPTIME
TOPICAL | Status: DC | PRN
  Administered 2012-05-30: 2000 mL

## 2012-05-30 MED ORDER — GLYCOPYRROLATE 0.2 MG/ML IJ SOLN
INTRAMUSCULAR | Status: DC | PRN
  Administered 2012-05-30: 0.4 mg via INTRAVENOUS

## 2012-05-30 MED ORDER — LACTATED RINGERS IV SOLN
INTRAVENOUS | Status: DC | PRN
  Administered 2012-05-30: 08:00:00 via INTRAVENOUS

## 2012-05-30 MED ORDER — MAGNESIUM HYDROXIDE 400 MG/5ML PO SUSP
30.0000 mL | Freq: Three times a day (TID) | ORAL | Status: AC
  Administered 2012-05-30 – 2012-05-31 (×3): 30 mL via ORAL
  Filled 2012-05-30 (×3): qty 30

## 2012-05-30 MED ORDER — HYDROMORPHONE HCL PF 1 MG/ML IJ SOLN
0.2500 mg | INTRAMUSCULAR | Status: DC | PRN
  Administered 2012-05-30: 0.25 mg via INTRAVENOUS

## 2012-05-30 MED ORDER — SODIUM CHLORIDE 0.9 % IJ SOLN: 9.0000 mL | INTRAMUSCULAR | Status: DC | PRN

## 2012-05-30 MED ORDER — ALPRAZOLAM 0.25 MG PO TABS
0.2500 mg | ORAL_TABLET | Freq: Three times a day (TID) | ORAL | Status: DC | PRN
  Administered 2012-05-31: 0.25 mg via ORAL
  Filled 2012-05-30: qty 1

## 2012-05-30 MED ORDER — NITROGLYCERIN 0.4 MG SL SUBL: 0.4000 mg | SUBLINGUAL_TABLET | SUBLINGUAL | Status: DC | PRN

## 2012-05-30 MED ORDER — BUPIVACAINE LIPOSOME 1.3 % IJ SUSP
20.0000 mL | Freq: Once | INTRAMUSCULAR | Status: DC
  Filled 2012-05-30: qty 20

## 2012-05-30 MED ORDER — ROCURONIUM BROMIDE 100 MG/10ML IV SOLN
INTRAVENOUS | Status: DC | PRN
  Administered 2012-05-30: 40 mg via INTRAVENOUS
  Administered 2012-05-30: 10 mg via INTRAVENOUS

## 2012-05-30 MED ORDER — FENTANYL CITRATE 0.05 MG/ML IJ SOLN
INTRAMUSCULAR | Status: DC | PRN
  Administered 2012-05-30 (×2): 100 ug via INTRAVENOUS
  Administered 2012-05-30: 50 ug via INTRAVENOUS

## 2012-05-30 MED ORDER — SODIUM CHLORIDE 0.9 % IJ SOLN
INTRAMUSCULAR | Status: DC | PRN
  Administered 2012-05-30: 10:00:00

## 2012-05-30 MED ORDER — ENOXAPARIN SODIUM 40 MG/0.4ML ~~LOC~~ SOLN
40.0000 mg | SUBCUTANEOUS | Status: AC
  Administered 2012-05-30: 40 mg via SUBCUTANEOUS
  Filled 2012-05-30: qty 0.4

## 2012-05-30 MED ORDER — ONDANSETRON HCL 4 MG/2ML IJ SOLN
4.0000 mg | Freq: Four times a day (QID) | INTRAMUSCULAR | Status: DC | PRN
  Filled 2012-05-30 (×2): qty 2

## 2012-05-30 MED ORDER — DIPHENHYDRAMINE HCL 50 MG/ML IJ SOLN: 12.5000 mg | Freq: Four times a day (QID) | INTRAMUSCULAR | Status: DC | PRN

## 2012-05-30 MED ORDER — ZOLPIDEM TARTRATE 5 MG PO TABS: 5.0000 mg | ORAL_TABLET | Freq: Every evening | ORAL | Status: DC | PRN

## 2012-05-30 MED ORDER — HYDROMORPHONE 0.3 MG/ML IV SOLN
INTRAVENOUS | Status: DC
  Administered 2012-05-30: 12:00:00 via INTRAVENOUS
  Administered 2012-05-30 (×2): 0.2 mg via INTRAVENOUS
  Administered 2012-05-31: 0.1999 mg via INTRAVENOUS
  Administered 2012-05-31: 0.4 mg via INTRAVENOUS
  Administered 2012-05-31 – 2012-06-01 (×3): 0.2 mg via INTRAVENOUS
  Administered 2012-06-02: 0.199 mg via INTRAVENOUS

## 2012-05-30 SURGICAL SUPPLY — 45 items
ATTRACTOMAT 16X20 MAGNETIC DRP (DRAPES) ×2 IMPLANT
BAG URINE DRAINAGE (UROLOGICAL SUPPLIES) ×1 IMPLANT
CANISTER SUCTION 2500CC (MISCELLANEOUS) ×2 IMPLANT
CHLORAPREP W/TINT 26ML (MISCELLANEOUS) ×2 IMPLANT
CLIP TI MEDIUM LARGE 6 (CLIP) ×6 IMPLANT
CLOTH BEACON ORANGE TIMEOUT ST (SAFETY) ×2 IMPLANT
COVER SURGICAL LIGHT HANDLE (MISCELLANEOUS) ×2 IMPLANT
DRAIN JACKSON PRT FLT 10 (DRAIN) ×1 IMPLANT
DRAPE UTILITY XL STRL (DRAPES) ×2 IMPLANT
DRAPE WARM FLUID 44X44 (DRAPE) ×2 IMPLANT
ELECT BLADE 6.5 EXT (BLADE) ×2 IMPLANT
ELECT REM PT RETURN 9FT ADLT (ELECTROSURGICAL) ×2
ELECTRODE REM PT RTRN 9FT ADLT (ELECTROSURGICAL) ×1 IMPLANT
EVACUATOR SILICONE 100CC (DRAIN) ×2 IMPLANT
GAUZE SPONGE 2X2 8PLY STRL LF (GAUZE/BANDAGES/DRESSINGS) IMPLANT
GAUZE SPONGE 4X4 16PLY XRAY LF (GAUZE/BANDAGES/DRESSINGS) ×1 IMPLANT
GLOVE BIOGEL M STRL SZ7.5 (GLOVE) ×10 IMPLANT
GOWN PREVENTION PLUS LG XLONG (DISPOSABLE) ×2 IMPLANT
GOWN STRL NON-REIN LRG LVL3 (GOWN DISPOSABLE) ×2 IMPLANT
GOWN STRL REIN XL XLG (GOWN DISPOSABLE) ×2 IMPLANT
LIGASURE IMPACT 36 18CM CVD LR (INSTRUMENTS) ×1 IMPLANT
NS IRRIG 1000ML POUR BTL (IV SOLUTION) ×4 IMPLANT
PACK ABDOMINAL WL (CUSTOM PROCEDURE TRAY) ×2 IMPLANT
SHEET LAVH (DRAPES) ×2 IMPLANT
SPONGE GAUZE 2X2 STER 10/PKG (GAUZE/BANDAGES/DRESSINGS) ×1
SPONGE LAP 18X18 X RAY DECT (DISPOSABLE) ×4 IMPLANT
STAPLER SKIN PROX WIDE 3.9 (STAPLE) ×2 IMPLANT
SUT ETHILON 1 LR 30 (SUTURE) IMPLANT
SUT ETHILON 2 0 PS N (SUTURE) ×1 IMPLANT
SUT PDS AB 1 CTXB1 36 (SUTURE) ×4 IMPLANT
SUT SILK 2 0 (SUTURE)
SUT SILK 2 0 30  PSL (SUTURE)
SUT SILK 2 0 30 PSL (SUTURE) IMPLANT
SUT SILK 2-0 18XBRD TIE 12 (SUTURE) ×1 IMPLANT
SUT VIC AB 0 CT1 36 (SUTURE) ×7 IMPLANT
SUT VIC AB 2-0 CT2 27 (SUTURE) ×14 IMPLANT
SUT VIC AB 2-0 SH 27 (SUTURE) ×4
SUT VIC AB 2-0 SH 27X BRD (SUTURE) ×6 IMPLANT
SUT VIC AB 3-0 CTX 36 (SUTURE) ×2 IMPLANT
SUT VICRYL 2 0 18  UND BR (SUTURE) ×1
SUT VICRYL 2 0 18 UND BR (SUTURE) ×1 IMPLANT
TOWEL OR 17X26 10 PK STRL BLUE (TOWEL DISPOSABLE) ×4 IMPLANT
TOWEL OR NON WOVEN STRL DISP B (DISPOSABLE) ×2 IMPLANT
TRAY FOLEY CATH 14FRSI W/METER (CATHETERS) ×2 IMPLANT
WATER STERILE IRR 1500ML POUR (IV SOLUTION) ×1 IMPLANT

## 2012-05-30 NOTE — Anesthesia Preprocedure Evaluation (Addendum)
Anesthesia Evaluation  Patient identified by MRN, date of birth, ID band Patient awake    Reviewed: Allergy & Precautions, H&P , NPO status , Patient's Chart, lab work & pertinent test results  Airway Mallampati: II TM Distance: >3 FB Neck ROM: Full    Dental no notable dental hx.    Pulmonary neg pulmonary ROS,  breath sounds clear to auscultation  Pulmonary exam normal       Cardiovascular hypertension, Pt. on medications + CAD, + CABG and +CHF Rhythm:Regular Rate:Normal  Grade 2 diastolic dysfxn   Neuro/Psych negative neurological ROS  negative psych ROS   GI/Hepatic negative GI ROS, Neg liver ROS,   Endo/Other  negative endocrine ROS  Renal/GU negative Renal ROS  negative genitourinary   Musculoskeletal negative musculoskeletal ROS (+)   Abdominal   Peds negative pediatric ROS (+)  Hematology  (+) Blood dyscrasia, anemia ,   Anesthesia Other Findings   Reproductive/Obstetrics negative OB ROS                          Anesthesia Physical Anesthesia Plan  ASA: III  Anesthesia Plan: General   Post-op Pain Management:    Induction: Intravenous  Airway Management Planned: Oral ETT  Additional Equipment:   Intra-op Plan:   Post-operative Plan: Extubation in OR  Informed Consent: I have reviewed the patients History and Physical, chart, labs and discussed the procedure including the risks, benefits and alternatives for the proposed anesthesia with the patient or authorized representative who has indicated his/her understanding and acceptance.   Dental advisory given  Plan Discussed with: CRNA and Surgeon  Anesthesia Plan Comments:         Anesthesia Quick Evaluation

## 2012-05-30 NOTE — Op Note (Signed)
[name] [name]  female MEDICAL RECORD ZO:[id number] DATE OF BIRTH: [birth year] PHYSICIAN: De Blanch, M.D  05/30/2012   OPERATIVE REPORT  PREOPERATIVE DIAGNOSIS: Stage IV ovarian cancer status post 6 cycles of neoadjuvant chemotherapy.  POSTOPERATIVE DIAGNOSIS: Same.  PROCEDURE: Exploratory laparotomy, resection of umbilical tumor, partial omentectomy and  SURGEON: De Blanch, M.D ASSISTANT: Antionette Char M.D., Telford Nab RN, ANESTHESIA: Gen. with oral tracheal tube ESTIMATED BLOOD LOSS: 100 mL  SURGICAL FINDINGS: At exploratory laparotomy the patient had extensive ovarian cancer throughout the peritoneal cavity with multiple implants on small and large bowel serosa and mesentery. In particular, the omentum was entirely replaced with tumor which was encasing the transverse colon and extending up into the splenic flexure. I was unable to mobilize the splenic flexure adequately to be certain that we would not injure the spleen. The right ovary was replaced by solid and cystic 7 cm mass. 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 I was unable to resect any significant amount of tumor (suboptimal debulking)  PROCEDURE: Patient brought to the operating room and after satisfactory attainment of general anesthesia was placed in a modified lithotomy position in [place] stirrups. The anterior abdominal wall perineum and vagina were prepped, a Foley catheter was inserted, and the patient was draped. Surgical timeout was taken. Antibiotics were administered and SCDs were activated. The abdomen was entered through midline incision. She had obvious tumor nodules in the umbilical region and therefore we made an elliptical incision around the umbilicus resecting all gross disease. As we entered the peritoneal cavity recognize that the omentum was densely adherent to the anterior abdominal wall parietal  peritoneum. Using blunt dissection the omentum was mobilized away from the parietal peritoneum. The omentum extended into the splenic flexure and was encasing much of the colon. I assessed for the possibility resecting the transverse colon and reanastomose.[name] However there was no distal colonthat was not extensively involved with ovarian cancer thus precluding a safe anastomosis. We mobilized the omentum from the right side of the colon using LigaSure and Bovie with care taken to avoid injury to the colon. At the point where the omentum encased the colon we terminated the resection of the omentum. A portion measured approximately 8 by 4 x 4 centimeters of omentum was submitted to pathology.  At this juncture was felt that no further surgery would be of benefit to the patient. Packs and retractors removed. The anterior abdominal wall was closed in layers. The first was a running mass closure using #1 PDS. A 10 flat Jackson-Pratt drain was placed above the fascia and exited through a stab incision in the left lower quadrant. The subcutaneous tissue was irrigated. Hemostasis was achieved using cautery. The subcutaneous tissues reapproximated using 3-0 Vicryl and the skin closed skin staples. A dressing was applied.  The patient was awakened from anesthesia and taken to the recovery room in satisfactory condition. Sponge needle and isthmic counts correct x2.   De Blanch, M.D

## 2012-05-30 NOTE — Preoperative (Signed)
Beta Blockers   Reason not to administer Beta Blockers:Not Applicable 

## 2012-05-30 NOTE — H&P (View-Only) (Signed)
Consult Note: Gyn-Onc  [name] [name] 67 y.o. female  CC:  Chief Complaint  Patient presents with  . Ovarian Cancer    Follow up    HPI: Patient is a 67-year-old Caucasian female with a past medical history of coronary artery disease status post CABG earlier this year, unfortunately following a few months after CABG 2 of her grafts were occluded, history of breast cancer on chronic tamoxifen therapy who presented to the hospital for evaluation of the above noted complaints. Per patient her shortness of breath and cough had been going on for at least a month if not more. She claimed that her shortness of breath was mostly exertional, and recently it had started to get worse and she could not even walk to the mailbox without stopping multiple times. Unfortunately over the weekend her shortness of breath and cough got significantly worse, she got in touch with her cardiologist today who advised her to come to the emergency room for further evaluation and treatment. In the emergency room she was found to have a large left sided pleural effusion on a chest x-ray. In addition to the shortness of breath she states her several months essentially for the time of her CABG she has had some abdominal discomfort which she has noticed subjective bloating. She felt some of this is related to her bowels. She has noticed progressive early satiety. [name] not really had any significant nausea and vomiting.  She underwent a thoracentesis on December 29, 2011 which revealed malignant cells. The differential considerations for the cytology and the immunophenotype included primary mammary carcinoma in a primary gynecologic carcinoma. In addition, should a paracentesis on November 18 that revealed malignant cells similar to those seen on her this cytology specimens.   CT imaging revealed:  Findings: Bilateral pleural effusions, left greater than right. Associated airspace consolidations. Heart size within normal  limits. Coronary artery calcification.  Unremarkable liver, spleen, pancreas, biliary system, adrenal glands. Symmetric renal enhancement. No hydronephrosis or hydroureter. No bowel obstruction. No CT evidence for colitis. There is a  moderate amount of ascites. Peritoneal carcinomatosis, with the largest conglomerate in the left paracolic gutter, measuring approximately 7.7 x 6.2 cm and displacing the small bowel centrally. No free intraperitoneal air. No lymphadenopathy.  Thin-walled bladder. Uterus not identified. 5.4 cm right adnexal cyst. Enlarged inguinal lymph nodes, measuring up to 13 mm on the left. There is scattered atherosclerotic calcification of the aorta and its branches. No aneurysmal dilatation.  Multilevel degenerative changes without acute osseous finding. Multiple subcutaneous rounded densities are nonspecific and may  reflect sequelae of prior injection sites, however metastases not excluded.   IMPRESSION:  Bilateral pleural effusions and associated consolidations are partially imaged. Peritoneal carcinomatosis and a moderate amount of ascites. 5.4 cm right adnexal cyst is indeterminate. Inguinal lymphadenopathy.   Ultrasound on November 17 revealed:  Uterus: Surgically absent.  Right ovary: Complex cystic right adnexal mass measuring 3.1 x 5.7 x 4.5 cm with surrounding rim of suspected ovarian tissue.  Left ovary: Not visualized transabdominally/transvaginally.  Other findings: Moderate pelvic ascites with internal echoes.  IMPRESSION:  5.7 cm complex cystic right ovarian lesion. Given the associated findings on CT, this appearance is worrisome for primary ovarian neoplasm.  Status post hysterectomy. Left ovary is not discretely visualized. Correlate with surgical history.  We are asked to see her to offer recommendations regarding the treatment of a possible ovarian or intrapelvic serous carcinoma. Her CA 125 is elevated at 392.9. Her CEA is less than 0.5.   In addition her CA  27-29 is in the normal range at 35 but is elevated over her baseline one year ago.    After three cycles of chemotherapy her CT revealed: IMPRESSION:  Trace bilateral pleural effusions with indwelling pleural drains.  Otherwise, no evidence of metastatic disease in the chest.  CT ABDOMEN AND PELVIS  Findings: Peritoneal carcinomatosis overlying the right hepatic dome (series 2/image 52). Liver is otherwise unremarkable.  Spleen, pancreas, and adrenal glands are within normal limits.  Gallbladder is unremarkable. No intrahepatic or extrahepatic ductal dilatation.  Kidneys are within normal limits. No hydronephrosis.  No evidence of bowel obstruction.  Atherosclerotic calcifications of the abdominal aorta and branch vessels.  10 mm short axis retrocaval node (series 2/image 61). Additional small retroperitoneal nodes which do not meet pathologic CT size  criteria. 12 mm right inguinal node (series 2/image 115), previously 10 mm. 15 mm left inguinal node (series 2/image 117),  previously 13 mm.  Near complete resolution of prior abdominopelvic ascites.  Peritoneal carcinomatosis, similar versus mildly improved.  Dominant lesion in the left mid abdomen measures 10.6 x 5.8 cm, previously 12.1 x 6.2 cm when measured in a similar fashion.  Additional omental caking beneath the lower anterior abdominal wall.  7.1 x 4.5 cm complex cystic right ovarian mass (series 2/image 12), previously 6 x 1 x 3.9 cm.  Status post hysterectomy.  Bladder is unremarkable.  Nodularity/thickening in the periumbilical region are worrisome for tumor (series 2/image 82). Additional subcutaneous lesions in the  anterior abdominal wall are nonspecific.  Mild degenerative changes of the visualized thoracolumbar spine.  IMPRESSION:  7.1 x 4.5 cm complex cystic right ovarian mass, mildly increased. Peritoneal carcinomatosis, as described above, stable versus mildly  improved.  Near complete resolution of prior  abdominopelvic ascites.  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  Her CA 125 is also decreased from over 300 at time of diagnosis to 90 in December. We spoke at that time and she decided to proceed with initial 3 cycles of chemotherapy before entertaining of surgery. After her sixth cycle of chemotherapy which was given on March 7 her CT scan revealed Essentially stable disease with no significant improvement. She continues to have extensive omental cake and extensive tumor in the omentum with a similar distribution compared to prior. There is a thickness of left-sided right of tumor stable at 5.4 cm. There were tumor deposits along the left paracolic gutter and cecum were unchanged. The omental tumor deposits similar to prior. The cystic right adnexal mass was unchanged.  Interval History:  She has completed her sixth cycle of chemotherapy with paclitaxel and carboplatin. Her CA 125 has most recently started to rise and is in the 120s up from 90's.   Review of Systems She and her husband with a list of questions today. They want to discuss the results of her CT scan which we did. She states that Dr. Khan's NP had also discussed this with her. Her chemotherapy is going fairly well. She is lethargic about 7-10 days and has bone pain from the Neulasta. She does use ibuprofen Claritin without significant relief. 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 Her shortness of breath is much better her breathing is much better. Her chest tubes were removed. This cycle has been much harder for her than the prior ones were in she's been much been and that this entire week. She is back to using her walker today. She seems to have a bit of a sinus   infection also and she's unsure if that might be contributing to her symptoms. [name] walking writing the exercise bike every week. She does tend to get tired. Her appetite is increased. She feels that her abdominal ascites has decreased overall. She  denies a change about bladder habits. She denies any chest pain. She denies any unintentional weight loss and and if anything has some increased weight gain due to increasing more by mouth intake. She denies any early satiety. The remainder for 10 systems review is negative.  She had an echocardiogram on February 28 showed an ejection fraction of 55-60%. She was seen by her cardiologist Dr. Nahser who her per her report stated she was okay for surgery.  Current Meds:  Outpatient Encounter Prescriptions as of 05/04/2012  Medication Sig Dispense Refill  . aspirin 325 MG tablet Take 325 mg by mouth daily.      . atorvastatin (LIPITOR) 20 MG tablet Take 20 mg by mouth daily.      . B Complex-C (SUPER B COMPLEX PO) Take 1 tablet by mouth daily.      . carvedilol (COREG) 25 MG tablet Take 1 tablet (25 mg total) by mouth 2 (two) times daily.  180 tablet  3  . Docusate Calcium (STOOL SOFTENER PO) Take 2 capsules by mouth every morning.      . omeprazole (PRILOSEC) 20 MG capsule Take 1 capsule (20 mg total) by mouth daily.  30 capsule  6  . oxybutynin (DITROPAN-XL) 10 MG 24 hr tablet Take 10 mg by mouth daily.      . Polyethyl Glycol-Propyl Glycol 0.4-0.3 % SOLN Place 1 drop into both eyes every morning.       . SF 5000 PLUS 1.1 % CREA dental cream Place 1.1 % onto teeth at bedtime.       . trimethoprim (TRIMPEX) 100 MG tablet Take 100 mg by mouth daily.      . ALPRAZolam (XANAX) 0.25 MG tablet Take 1 tablet (0.25 mg total) by mouth every 8 (eight) hours as needed for anxiety.  30 tablet  0  . Alum & Mag Hydroxide-Simeth (MAGIC MOUTHWASH) SOLN Take 5 mLs by mouth 4 (four) times daily as needed.  240 mL  6  . EPINEPHrine (EPIPEN 2-PAK) 0.3 mg/0.3 mL DEVI Inject 0.3 mg into the muscle.       . HYDROcodone-acetaminophen (NORCO/VICODIN) 5-325 MG per tablet Take 1-2 tablets by mouth every 4 (four) hours as needed.  30 tablet  0  . LORazepam (ATIVAN) 0.5 MG tablet Take 1 tablet (0.5 mg total) by mouth every 6  (six) hours as needed for anxiety (Nausea/Vomiting).  60 tablet  0  . nitroGLYCERIN (NITROSTAT) 0.4 MG SL tablet Place 0.4 mg under the tongue every 5 (five) minutes as needed. Chest pain      . ondansetron (ZOFRAN) 8 MG tablet Take 1 tablet (8 mg total) by mouth 2 (two) times daily. Take two times a day starting the day after chemo for 3 days. Then take two times a day as needed for nausea or vomiting.  30 tablet  1  . prochlorperazine (COMPAZINE) 10 MG tablet Take 10 mg by mouth every 6 (six) hours as needed. For nausea      . prochlorperazine (COMPAZINE) 25 MG suppository Place 25 mg rectally every 12 (twelve) hours as needed for nausea.        Facility-Administered Encounter Medications as of 05/04/2012  Medication Dose Route Frequency Provider Last Rate Last Dose  . influenza    inactive virus vaccine (FLUZONE/FLUARIX) injection 0.5 mL  0.5 mL Intramuscular Once Eve Knapp, MD        Allergy:  Allergies  Allergen Reactions  . Codeine Nausea And Vomiting       . Isosorbide Other (See Comments)    Extreme headaches  . Other Swelling    All Lip moisturizers except Vaseline.  . Phenothiazines Other (See Comments)    Makes her stop breathing.  . Talwin (Pentazocine) Nausea And Vomiting  . Yellow Jacket Venom Anaphylaxis    Social Hx:   History   Social History  . Marital Status: Married    Spouse Name: N/A    Number of Children: 1  . Years of Education: N/A   Occupational History  . media assistant and ESL coordinator [employer]   Social History Main Topics  . Smoking status: Former Smoker -- 0.12 packs/day for 15 years    Types: Cigarettes    Quit date: 02/15/2001  . Smokeless tobacco: Never Used  . Alcohol Use: 4.2 oz/week    7 Shots of liquor per week     Comment: 12/28/11 "1 gin & tonic q hs", 2/5/141-2 glass a wine occa  . Drug Use: No  . Sexually Active: Yes   Other Topics Concern  . Not on file   Social History Narrative   Lives with husband and  dog    Past Surgical Hx:  Past Surgical History  Procedure Laterality Date  . Breast lumpectomy  04/2009    left  . Cesarean section  1973; 1976  . Muscle release  1960    L neck and chest.; "when I was 13; pneumonia settled in my left neck"  . Coronary artery bypass graft  08/18/2011    Procedure: CORONARY ARTERY BYPASS GRAFTING (CABG);  Surgeon: Bryan K Bartle, MD;  Location: [facility] OR;  Service: Open Heart Surgery;  Laterality: N/A;  Coronary Artery Bypass Graft times five utilizing the left internal mammary artery and the left greater saphenous vein harvested endoscopically.  . Abdominal hysterectomy  1976  . Appendectomy  1976  . Portacath placement  01/06/2012    Procedure: INSERTION PORT-A-CATH;  Surgeon: Bryan K Bartle, MD;  Location: [facility] OR;  Service: Thoracic;  Laterality: Left;  . Chest tube insertion  01/06/2012    Procedure: INSERTION PLEURAL DRAINAGE CATHETER;  Surgeon: Bryan K Bartle, MD;  Location: [facility] OR;  Service: Thoracic;  Laterality: Bilateral;  . Removal of pleural drainage catheter Right 04/12/2012    Procedure: MINOR REMOVAL OF PLEURAL DRAINAGE CATHETER;  Surgeon: Bryan K Bartle, MD;  Location: [facility] OR;  Service: Thoracic;  Laterality: Right;  . Talc pleurodesis Left 04/12/2012    Procedure: TALC PLEURADESIS;  Surgeon: Bryan K Bartle, MD;  Location: [facility] OR;  Service: Thoracic;  Laterality: Left;  . Portacath placement Left 04/17/2012    Procedure: INSERTION PORT-A-CATH;  Surgeon: Bryan K Bartle, MD;  Location: [facility] OR;  Service: Thoracic;  Laterality: Left;  . Removal of pleural drainage catheter Left 04/17/2012    Procedure: REMOVAL OF PLEURAL DRAINAGE CATHETER;  Surgeon: Bryan K Bartle, MD;  Location: [facility] OR;  Service: Thoracic;  Laterality: Left;  . Port-a-cath removal Left 04/17/2012    Procedure: REMOVAL PORT-A-CATH;  Surgeon: Bryan K Bartle, MD;  Location: [facility] OR;  Service: Thoracic;  Laterality: Left;    Past Medical Hx:  Past Medical History  Diagnosis Date  . Interstitial  cystitis     on chronic antibiotics  . Hyperlipidemia   .   Frequent UTI     on prophylaxis  . 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 CAD (coronary artery disease)     a. s/p CABG 7/13;   b.  LHC 12/01/11:  pLAD 70%, mLAD 40%, CFX 40-50% prior to takeoff of the OM2, oRCA occluded, mid vessel filled via R->R collaterals and distal vessel filled by L->R collaterals, S-OM1/OM2 (small and diffusely dz) with mid 90% stenosis, 90% at OM1 anastomotic site, continuation of OM2 occluded, S-Dx patent, S-PDA occluded, L-LAD ok, EF 55-65%  => Med Rx rec.  . Hx of echocardiogram     Echo 5/13: EF 60-65%, grade 2 diastolic dysfunction  . Hypercholesterolemia   . Pleural effusion 12/28/11    s/p pleurx catheter  . GERD (gastroesophageal reflux disease)   . Arthritis     "just a little; lower back" (12/28/11)  . Gout attack 08/2011    related to "stress post OHS"  . Depression   . Breast cancer     "left"; on Tamoxifen  . S/P thoracentesis 12/28/11    "for pleural effusion" (12/28/2011)  . Ovarian cancer   . Tachycardia     Family Hx:  Family History  Problem Relation Age of Onset  . Cancer Mother 47    breast cancer  . Heart disease Father 40    MI at 40, CABG in 60's  . Hepatitis Father     C from blood transfusion  . Heart disease Brother     CABG in 50's  . Heart disease Paternal Aunt   . Heart disease Paternal Uncle   . Heart disease Paternal Grandfather   . Diabetes Neg Hx     Vitals:  Blood pressure 110/66, pulse 88, temperature 98.1 F (36.7 C), resp. rate 16, height 5' 6" (1.676 m), weight 163 lb 6.4 oz (74.118 kg).  Physical Exam: Well-nourished well-developed female in no acute distress.  Neck: Supple, no lymphadenopathy, no thyromegaly.  Lungs: Clear to auscultation but he.  Prevascular regular rate rhythm.  Abdomen: Well-healed vertical midline incision. The abdomen is somewhat tense and distended. There is no distinct fluid wave. She has 2 small 1 cm nodules around the  umbilicus and 3 additional subcutaneous nodules leading out from the top of her vertical midline incision towards her left side.  Groins: No lymphadenopathy.  Extremities: No edema.  Pelvic: Normal external female genitalia. Bimanual examination reveals approximately a 7-8 cm mass within the pelvis it is smooth. There is no nodularity. Rectal confirms.  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 She is malignant ascites consistent with a gynecologic primary. [name] undergone 6 cycles of paclitaxel and carboplatin based chemotherapy.  Unfortunately she has not responded as much to the last 3 cycles of chemotherapy as she did the first 3. However after discussion she would like to try to be aggressive her proceed with interval cytoreductive surgery. She is tentatively scheduled for surgery either April April 15. She has or will not be hearing [place] that day and we would be Dr. Brewster Dr. Clark Pearson. We discussed sending tumor for testing with [facility] in [place] will look into that. The patient was experiencing some dizziness today and she will go see Dr. Khan for consideration of IV fluids. We have cardiac clearance per Dr. Nahser. She understands that she'll need to receive Lovenox.  For surgery she understands will proceed with exploratory   laparotomy TAH/BSO and attempt at optimal cytoreduction. The subcutaneous nodules will also be removed at the time of her surgery. She understands the risks of surgery including bleeding infection injury to surrounding organs thromboembolic disease. She understands she may have a nasogastric tube pending the results of any bowel surgery. Most common risks and benefits were discussed with the patient. Her questions as well as those of her husband were elicited in answer to their satisfaction  Charvis Lightner A.,  MD 05/04/2012, 11:50 AM   

## 2012-05-30 NOTE — Transfer of Care (Signed)
Immediate Anesthesia Transfer of Care Note  Patient: [name] [name]  Procedure(s) Performed: Procedure(s): Resection of umbilical mass, Partial omentectomy (Bilateral)  Patient Location: PACU  Anesthesia Type:General  Level of Consciousness: awake, alert , oriented and patient cooperative  Airway & Oxygen Therapy: Patient Spontanous Breathing and Patient connected to face mask oxygen  Post-op Assessment: Report given to PACU RN and Post -op Vital signs reviewed and stable  Post vital signs: Reviewed and stable  Complications: No apparent anesthesia complications

## 2012-05-30 NOTE — Anesthesia Postprocedure Evaluation (Signed)
  Anesthesia Post-op Note  Patient: [name] [name]  Procedure(s) Performed: Procedure(s) (LRB): Resection of umbilical mass, Partial omentectomy (Bilateral)  Patient Location: PACU  Anesthesia Type: General  Level of Consciousness: awake and alert   Airway and Oxygen Therapy: Patient Spontanous Breathing  Post-op Pain: mild  Post-op Assessment: Post-op Vital signs reviewed, Patient's Cardiovascular Status Stable, Respiratory Function Stable, Patent Airway and No signs of Nausea or vomiting  Last Vitals:  Filed Vitals:   05/30/12 1110  BP: 147/82  Pulse: 90  Temp:   Resp: 15    Post-op Vital Signs: stable   Complications: No apparent anesthesia complications

## 2012-05-30 NOTE — Interval H&P Note (Signed)
History and Physical Interval Note:  05/30/2012 8:46 AM  [name] [name]  has presented today for surgery, with the diagnosis of ovarian cancer  The various methods of treatment have been discussed with the patient and family. After consideration of risks, benefits and other options for treatment, the patient has consented to  Procedure(s): EXPLORATORY LAPAROTOMY/TOTAL ABDOMINAL HYSTERECTOMY/BILATERAL SALPINGO OOPHORECTOMY/TUMOR DEBULKING (Bilateral) as a surgical intervention .  [name] history has been reviewed, patient examined, no change in status, stable for surgery.  I have reviewed the patient's chart and labs.  Questions were answered to the patient's satisfaction.     CLARKE-PEARSON,Haden Cavenaugh L

## 2012-05-31 ENCOUNTER — Encounter: Payer: Self-pay | Admitting: Specialist

## 2012-05-31 ENCOUNTER — Encounter (HOSPITAL_COMMUNITY): Payer: Self-pay | Admitting: Gynecology

## 2012-05-31 LAB — CBC
MCH: 31.4 pg (ref 26.0–34.0)
MCH: 31 pg (ref 26.0–34.0)
MCHC: 31 g/dL (ref 30.0–36.0)
MCHC: 30.5 g/dL (ref 30.0–36.0)
MCV: 101.2 fL — ABNORMAL HIGH (ref 78.0–100.0)
Platelets: 470 10*3/uL — ABNORMAL HIGH (ref 150–400)
RDW: 16.2 % — ABNORMAL HIGH (ref 11.5–15.5)
RDW: 16.2 % — ABNORMAL HIGH (ref 11.5–15.5)

## 2012-05-31 LAB — BASIC METABOLIC PANEL
BUN: 15 mg/dL (ref 6–23)
CO2: 24 mEq/L (ref 19–32)
Calcium: 8.4 mg/dL (ref 8.4–10.5)
Creatinine, Ser: 0.78 mg/dL (ref 0.50–1.10)
Glucose, Bld: 145 mg/dL — ABNORMAL HIGH (ref 70–99)

## 2012-05-31 MED ORDER — PANTOPRAZOLE SODIUM 40 MG IV SOLR
40.0000 mg | INTRAVENOUS | Status: DC
  Administered 2012-05-31: 40 mg via INTRAVENOUS
  Filled 2012-05-31 (×2): qty 40

## 2012-05-31 MED ORDER — ALUM HYDROXIDE-MAG TRISILICATE 80-20 MG PO CHEW
1.0000 | CHEWABLE_TABLET | Freq: Four times a day (QID) | ORAL | Status: DC | PRN
  Administered 2012-05-31 – 2012-06-02 (×2): 1 via ORAL
  Filled 2012-05-31: qty 2

## 2012-05-31 MED ORDER — BISACODYL 10 MG RE SUPP
10.0000 mg | Freq: Once | RECTAL | Status: AC
  Administered 2012-05-31: 10 mg via RECTAL
  Filled 2012-05-31: qty 1

## 2012-05-31 MED ORDER — TRIMETHOPRIM 100 MG PO TABS
100.0000 mg | ORAL_TABLET | Freq: Every morning | ORAL | Status: DC
  Administered 2012-05-31 – 2012-06-04 (×5): 100 mg via ORAL
  Filled 2012-05-31 (×5): qty 1

## 2012-05-31 MED ORDER — PANTOPRAZOLE SODIUM 40 MG PO TBEC
40.0000 mg | DELAYED_RELEASE_TABLET | Freq: Every day | ORAL | Status: DC
  Administered 2012-05-31: 40 mg via ORAL
  Filled 2012-05-31: qty 1

## 2012-05-31 MED ORDER — PROCHLORPERAZINE EDISYLATE 5 MG/ML IJ SOLN
5.0000 mg | Freq: Four times a day (QID) | INTRAMUSCULAR | Status: DC | PRN
  Administered 2012-05-31 – 2012-06-01 (×2): 5 mg via INTRAVENOUS
  Filled 2012-05-31 (×2): qty 2

## 2012-05-31 MED ORDER — LACTATED RINGERS IV BOLUS (SEPSIS)
500.0000 mL | Freq: Once | INTRAVENOUS | Status: AC
  Administered 2012-05-31: 500 mL via INTRAVENOUS

## 2012-05-31 MED ORDER — OXYBUTYNIN CHLORIDE ER 10 MG PO TB24
10.0000 mg | ORAL_TABLET | Freq: Every day | ORAL | Status: DC
  Administered 2012-05-31 – 2012-06-04 (×5): 10 mg via ORAL
  Filled 2012-05-31 (×6): qty 1

## 2012-05-31 NOTE — Progress Notes (Signed)
1 Day Post-Op Procedure(s) (LRB): Resection of umbilical mass, Partial omentectomy (Bilateral)  Subjective: Patient reports tolerating clear liquids.  Denies passing flatus or having a bowel movement but reporting that "the MOM is working."  She is ambulating with assistance.  Reports feeling fatigued.  Denies chest pain, dyspnea, or dizziness.      Objective: Vital signs in last 24 hours: Temp:  [97.3 F (36.3 C)-98.2 F (36.8 C)] 97.9 F (36.6 C) (04/16 0553) Pulse Rate:  [82-109] 98 (04/16 0553) Resp:  [15-30] 23 (04/16 0734) BP: (91-148)/(63-84) 115/73 mmHg (04/16 0553) SpO2:  [95 %-100 %] 98 % (04/16 0734) Weight:  [154 lb (69.854 kg)] 154 lb (69.854 kg) (04/15 1343) Last BM Date: 05/29/12  Intake/Output from previous day: 04/15 0701 - 04/16 0700 In: 3107.5 [I.V.:3107.5] Out: 629 [Urine:600; Drains:4; Blood:25]  Physical Examination: General: alert, cooperative and no distress Resp: clear to auscultation bilaterally Cardio: tachycardic at 110 bpm, systolic murmur noted, regular rhythm GI: incision: midline incision with staples open to air, no erythema or drainage present and abd firm, active bowel sounds, mildly tympanic on percussion. Extremities: extremities normal, atraumatic, no cyanosis or edema JP charged with minimal amount of serosanguinous drainage  Labs: WBC/Hgb/Hct/Plts:  11.9/7.6/24.5/470 (04/16 0429) BUN/Cr/glu/ALT/AST/amyl/lip:  15/0.78/--/--/--/--/-- (04/16 0429)  Assessment: 67 y.o. s/p Procedure(s): Resection of umbilical mass, Partial omentectomy: stable Pain:  Pain is well-controlled on PCA.  Heme:  Hgb 7.6 and Hct 24.5 this am.  Continue to monitor.  Pre-op: Hgb 9.6 and Hct 31.4.    CV:  History of CAD s/p CABG on 08/18/11.  Current treatment: Coreg.  GI:  Tolerating po: Yes.  Advance diet when +flatus.    GU:  DTV, 600 cc urine out.  History of interstitial cystitis.  Current treatment: Ditropan XL and Trimpex ordered per patient  request.  FEN:  Stable post-operatively.  Prophylaxis: intermittent pneumatic compression boots.  Plan: AM labs- Monitor Hgb/Hct Dulcolax suppository to stimulate the bowels Advance diet when +flatus Protonix to prevent heartburn Reorder home medications Encourage ambulation Encourage IS use, deep breathing, and coughing Continue post-operative plan of care   LOS: 1 day    CROSS, MELISSA DEAL 05/31/2012, 9:13 AM

## 2012-05-31 NOTE — Progress Notes (Signed)
I saw [name] [name] today in [place] at the request of Dagmar Hait, RN.  [name] [name] and her husband, [name] [name], were both in her room and expressed surprise and disappointment at the results of her recent surgery.  [name] [name] did not feel well and was tearful.  I offered to both of them to be a source of information about available support services and offered to talk with either one of them when and if they need support in processing their experience.

## 2012-05-31 NOTE — Care Management Note (Addendum)
    Page 1 of 1   06/02/2012     12:01:36 PM   CARE MANAGEMENT NOTE 06/02/2012  Patient:  [name] [name], [name] [name]   Account Number:  [id number]  Date Initiated:  05/31/2012  Documentation initiated by:  Lorenda Ishihara  Subjective/Objective Assessment:   67 yo female admitted s/p exploratory lap with tumor resection, omenctomy.     Action/Plan:   Home when stable   Anticipated DC Date:  06/03/2012   Anticipated DC Plan:  HOME W HOME HEALTH SERVICES      DC Planning Services  CM consult      [facility]  Resumption Of Svcs/PTA Provider   Choice offered to / List presented to:  C-1 Patient        HH arranged  HH-1 RN  HH-2 PT      [facility] agency  [facility]   Status of service:  Completed, signed off Medicare Important Message given?   (If response is "NO", the following Medicare IM given date fields will be blank) Date Medicare IM given:   Date Additional Medicare IM given:    Discharge Disposition:  HOME W HOME HEALTH SERVICES  Per UR Regulation:  Reviewed for med. necessity/level of care/duration of stay  If discussed at Long Length of Stay Meetings, dates discussed:    Comments:

## 2012-06-01 LAB — CBC
MCH: 32 pg (ref 26.0–34.0)
Platelets: 432 10*3/uL — ABNORMAL HIGH (ref 150–400)
RBC: 2.22 MIL/uL — ABNORMAL LOW (ref 3.87–5.11)
RDW: 16.5 % — ABNORMAL HIGH (ref 11.5–15.5)
WBC: 6 10*3/uL (ref 4.0–10.5)

## 2012-06-01 LAB — BASIC METABOLIC PANEL
CO2: 26 mEq/L (ref 19–32)
Calcium: 8.6 mg/dL (ref 8.4–10.5)
Chloride: 99 mEq/L (ref 96–112)
GFR calc Af Amer: 78 mL/min — ABNORMAL LOW (ref >90)
Sodium: 134 mEq/L — ABNORMAL LOW (ref 135–145)

## 2012-06-01 LAB — PREPARE RBC (CROSSMATCH)

## 2012-06-01 MED ORDER — SODIUM CHLORIDE 0.9 % IJ SOLN
10.0000 mL | INTRAMUSCULAR | Status: DC | PRN
  Administered 2012-06-02 – 2012-06-04 (×2): 10 mL

## 2012-06-01 MED ORDER — PANTOPRAZOLE SODIUM 40 MG PO TBEC
40.0000 mg | DELAYED_RELEASE_TABLET | Freq: Every day | ORAL | Status: DC
  Administered 2012-06-01 – 2012-06-02 (×2): 40 mg via ORAL
  Filled 2012-06-01 (×3): qty 1

## 2012-06-01 NOTE — Progress Notes (Signed)
This patient is receiving IV Protonix. Based on criteria approved by the Pharmacy and Therapeutics Committee, this medication is being converted to the equivalent oral dose form. These criteria include:   . The patient is eating (either orally or per tube) and/or has been taking other orally administered medications for at least 24 hours.  . This patient has no evidence of active gastrointestinal bleeding or impaired GI absorption (gastrectomy, short bowel, patient on TNA or NPO).   If you have questions about this conversion, please contact the pharmacy department.  Berkley Harvey, [facility] 06/01/2012 10:16 AM

## 2012-06-01 NOTE — Progress Notes (Signed)
2 Days Post-Op Procedure(s) (LRB): Resection of umbilical mass, Partial omentectomy (Bilateral)  Subjective: Patient reports resolution of nausea this am and tolerating clear liquids.  "Feel better this morning."  She reports feeling lightheaded when getting out of bed.  She has had several episodes of stool incontinence.  She is concerned about ambulating in the halls since she has little control over her bowels at this time.  Adequate pain relief reported.  Denies chest pain, dyspnea, or passing flatus.   Objective: Vital signs in last 24 hours: Temp:  [98 F (36.7 C)-98.4 F (36.9 C)] 98 F (36.7 C) (04/17 0616) Pulse Rate:  [84-105] 84 (04/17 0616) Resp:  [16-33] 28 (04/17 0854) BP: (90-104)/(48-60) 90/60 mmHg (04/17 0616) SpO2:  [94 %-100 %] 100 % (04/17 0854) Last BM Date: 05/31/12  Intake/Output from previous day: 04/16 0701 - 04/17 0700 In: 3000 [I.V.:3000] Out: 602 [Urine:450; Emesis/NG output:150; Drains:2]  Physical Examination: General: alert, cooperative, no distress and pale Resp: clear to auscultation bilaterally Cardio: 84 bpm, systolic murmur noted, regular rhythm and rate GI: incision: midline incision with staples open to air, no erythema or drainage present and abd less firm and tympanic compared with 05/31/12 assessment, active bowel sounds, mild tenderness on palpation. Extremities: extremities normal, atraumatic, no cyanosis or edema JP charged with minimal amount of serosanguinous drainage  Labs: WBC/Hgb/Hct/Plts:  6.0/7.1/22.2/432 (04/17 0422) BUN/Cr/glu/ALT/AST/amyl/lip:  23/0.88/--/--/--/--/-- (04/17 0422)  Assessment: 67 y.o. s/p Procedure(s): Resection of umbilical mass, Partial omentectomy: stable Pain:  Pain is well-controlled on PCA.  Heme:  Hgb 7.1 and Hct 22.2 this am.  Continue to monitor.  Pre-op: Hgb 9.6 and Hct 31.4.  Plan to transfuse 2 units PRBCs this am    CV:  History of CAD s/p CABG on 08/18/11.  Current treatment: Coreg.  GI:   Tolerating po: Yes.  Advance diet when +flatus.  Incontinent of stool.    GU:  Urine output 450cc in last 24 hours.  History of interstitial cystitis.  Current treatment: Ditropan XL and Trimpex ordered per patient request.  FEN:  Stable post-operatively.  Prophylaxis: intermittent pneumatic compression boots.  Plan: Advance diet to full liquids as tolerated Transfuse 2 units PRBCs this am AM labs- Monitor Hgb/Hct Protonix to prevent heartburn Encourage ambulation Encourage IS use, deep breathing, and coughing Continue post-operative plan of care   LOS: 2 days    CROSS, MELISSA DEAL 06/01/2012, 9:03 AM

## 2012-06-02 LAB — TYPE AND SCREEN
ABO/RH(D): A POS
Unit division: 0
Unit division: 0

## 2012-06-02 LAB — BASIC METABOLIC PANEL
BUN: 20 mg/dL (ref 6–23)
Chloride: 100 mEq/L (ref 96–112)
GFR calc Af Amer: >90 mL/min (ref >90)
GFR calc non Af Amer: 87 mL/min — ABNORMAL LOW (ref >90)
Potassium: 4.4 mEq/L (ref 3.5–5.1)
Sodium: 134 mEq/L — ABNORMAL LOW (ref 135–145)

## 2012-06-02 LAB — CBC
HCT: 30 % — ABNORMAL LOW (ref 36.0–46.0)
MCHC: 32.3 g/dL (ref 30.0–36.0)
RDW: 19.2 % — ABNORMAL HIGH (ref 11.5–15.5)
WBC: 7.1 10*3/uL (ref 4.0–10.5)

## 2012-06-02 MED ORDER — ALTEPLASE 2 MG IJ SOLR
1.0000 mg | Freq: Once | INTRAMUSCULAR | Status: AC
  Administered 2012-06-02: 1 mg
  Filled 2012-06-02: qty 2

## 2012-06-02 MED ORDER — ALTEPLASE 2 MG IJ SOLR
2.0000 mg | Freq: Once | INTRAMUSCULAR | Status: DC
  Filled 2012-06-02: qty 2

## 2012-06-02 MED ORDER — SODIUM CHLORIDE 0.9 % IV SOLN
INTRAVENOUS | Status: DC
  Administered 2012-06-03: 03:00:00 via INTRAVENOUS

## 2012-06-02 MED ORDER — HYDROCODONE-ACETAMINOPHEN 5-325 MG PO TABS: 1.0000 | ORAL_TABLET | ORAL | Status: DC | PRN

## 2012-06-02 MED ORDER — ACETAMINOPHEN 325 MG PO TABS: 325.0000 mg | ORAL_TABLET | Freq: Four times a day (QID) | ORAL | Status: DC | PRN

## 2012-06-02 MED ORDER — ALUM HYDROXIDE-MAG TRISILICATE 80-20 MG PO CHEW
1.0000 | CHEWABLE_TABLET | Freq: Four times a day (QID) | ORAL | Status: DC | PRN
  Administered 2012-06-02 (×2): 1 via ORAL
  Filled 2012-06-02 (×2): qty 2

## 2012-06-02 MED ORDER — ENOXAPARIN (LOVENOX) PATIENT EDUCATION KIT
PACK | Freq: Once | Status: AC
  Administered 2012-06-02: 15:00:00
  Filled 2012-06-02: qty 1

## 2012-06-02 MED ORDER — PANTOPRAZOLE SODIUM 40 MG PO TBEC
40.0000 mg | DELAYED_RELEASE_TABLET | Freq: Two times a day (BID) | ORAL | Status: DC
  Administered 2012-06-02 – 2012-06-03 (×2): 40 mg via ORAL
  Filled 2012-06-02 (×3): qty 1

## 2012-06-02 NOTE — Progress Notes (Signed)
3 Days Post-Op Procedure(s) (LRB): Resection of umbilical mass, Partial omentectomy (Bilateral)  Subjective: Patient reports "feeling much better" after her transfusion yesterday.  Tolerating full liquids.  She reports resolution in feeling lightheaded when getting out of bed.  Her last episode of loose stool incontinence was this am but she reports that it is improving.  She is unable to tell if she is passing flatus due to the incontinence.  Ambulating in the halls with assist.  Adequate pain relief reported.  Stating that the IV team was unable to get blood return from her port-a-cath and she would like to have that issue evaluated prior to discharge.  Denies chest pain, dyspnea, or dizziness.  Objective: Vital signs in last 24 hours: Temp:  [97.6 F (36.4 C)-98.7 F (37.1 C)] 97.6 F (36.4 C) (04/18 0506) Pulse Rate:  [91-101] 91 (04/18 0506) Resp:  [22-32] 22 (04/18 0506) BP: (91-119)/(55-90) 105/70 mmHg (04/18 0506) SpO2:  [96 %-98 %] 96 % (04/18 0506) Last BM Date: 06/01/12  Intake/Output from previous day: 04/17 0701 - 04/18 0700 In: 3945.4 [I.V.:2750; Blood:1195.4] Out: 704 [Urine:700; Drains:4]  Physical Examination: General: alert, cooperative, no distress and pale Resp: clear to auscultation bilaterally Cardio: 86 bpm, systolic murmur noted, regular rhythm and rate GI: incision: midline incision with staples open to air, no erythema or drainage present and abd less firm and tympanic compared with 06/01/12 assessment, active bowel sounds, mild tenderness on palpation. Extremities: extremities normal, atraumatic, no cyanosis or edema JP charged with minimal amount of serosanguinous drainage  Labs: WBC/Hgb/Hct/Plts:  7.1/9.7/30.0/391 (04/18 0515) BUN/Cr/glu/ALT/AST/amyl/lip:  20/0.76/--/--/--/--/-- (04/18 0515)  Assessment: 67 y.o. s/p Procedure(s): Resection of umbilical mass, Partial omentectomy: stable Pain:  Pain is well-controlled on PCA.  Heme:  Hgb 9.7 and Hct  30.0 this am s/p transfusion 2 units PRBCs on 06/01/12.    CV:  History of CAD s/p CABG on 08/18/11.  Current treatment: Coreg.  GI:  Tolerating po: Yes.  Incontinent of stool.    GU:  Urine output improving.  History of interstitial cystitis.  Current treatment: Ditropan XL and Trimpex ordered per patient request.  FEN:  Stable post-operatively.  Prophylaxis: intermittent pneumatic compression boots and lovenox.  Plan: Advance diet to regular as tolerated Discontinue PCA IV team to assess port-a-cath- Activase IVF to NS at [facility] for port Vicodin for PRN pain  Protonix to prevent heartburn Encourage ambulation Encourage IS use, deep breathing, and coughing Continue post-operative plan of care   LOS: 3 days    CROSS, MELISSA DEAL 06/02/2012, 9:25 AM

## 2012-06-02 NOTE — Progress Notes (Signed)
Patient c/o ongoing gastric reflux. Has been taking protonix po daily. Dr. Gaynell Face on-call. T.O.V. To increase protonix to po twice a day.

## 2012-06-02 NOTE — Progress Notes (Signed)
Dilaudid PCA 13 ml wasted in sink at 1100; waste witnessed by Lenice Pressman, RN

## 2012-06-03 MED ORDER — OMEPRAZOLE 2 MG/ML ORAL SUSPENSION: 20.0000 mg | Freq: Two times a day (BID) | ORAL | Status: DC

## 2012-06-03 MED ORDER — OMEPRAZOLE 20 MG PO CPDR
20.0000 mg | DELAYED_RELEASE_CAPSULE | Freq: Two times a day (BID) | ORAL | Status: DC
  Administered 2012-06-03 – 2012-06-04 (×2): 20 mg via ORAL
  Filled 2012-06-03 (×5): qty 1

## 2012-06-03 MED ORDER — DIPHENOXYLATE-ATROPINE 2.5-0.025 MG PO TABS
2.0000 | ORAL_TABLET | Freq: Four times a day (QID) | ORAL | Status: DC | PRN
  Administered 2012-06-03: 2 via ORAL
  Filled 2012-06-03: qty 2

## 2012-06-03 NOTE — Progress Notes (Signed)
4 Days Post-Op Procedure(s) (LRB): Resection of umbilical mass, Partial omentectomy (Bilateral)  Subjective: Patient reports tolerating PO, + flatus, + BM and no problems voiding.   Diarrhea, persistent.  Objective: I have reviewed patient's vital signs, intake and output, medications and labs.  General: alert and no distress GI: normal findings: soft, non-tender Vaginal Bleeding: none  Incision:  C, D, I.  Assessment: s/p Procedure(s): Resection of umbilical mass, Partial omentectomy (Bilateral): stable, tolerating diet and persistent diarrhea.  Lomotil Rx.  Plan: Advance diet Encourage ambulation Lomotil  LOS: 4 days    Carollyn Etcheverry A 06/03/2012, 10:16 AM

## 2012-06-04 MED ORDER — HEPARIN SOD (PORK) LOCK FLUSH 100 UNIT/ML IV SOLN
500.0000 [IU] | INTRAVENOUS | Status: AC | PRN
  Administered 2012-06-04: 500 [IU]

## 2012-06-04 MED ORDER — ENOXAPARIN SODIUM 40 MG/0.4ML ~~LOC~~ SOLN: 40.0000 mg | SUBCUTANEOUS | Status: DC

## 2012-06-04 MED ORDER — HYDROCODONE-ACETAMINOPHEN 5-325 MG PO TABS: 1.0000 | ORAL_TABLET | ORAL | Status: DC | PRN

## 2012-06-04 MED ORDER — ENOXAPARIN (LOVENOX) PATIENT EDUCATION KIT: 1.0000 | PACK | Freq: Once | Status: DC

## 2012-06-04 NOTE — Progress Notes (Signed)
Pt was explained on how to self administer lovenox injection by nurse.  Pt then self administered her lovenox injection without difficulty.  Pt feels confident on giving herself lovenox injection.  Pt has lovenox starter kit to take home.  No concerns at this time.

## 2012-06-04 NOTE — Progress Notes (Signed)
5 Days Post-Op Procedure(s) (LRB): Resection of umbilical mass, Partial omentectomy (Bilateral)  Subjective: Patient reports tolerating PO, + flatus and no problems voiding.    Objective: I have reviewed patient's vital signs, intake and output, medications and labs.  General: alert and no distress Resp: clear to auscultation bilaterally Cardio: regular rate and rhythm, S1, S2 normal, no murmur, click, rub or gallop GI: normal findings: bowel sounds normal and soft, non-tender Extremities: extremities normal, atraumatic, no cyanosis or edema Vaginal Bleeding: none Incision:  C, D, I.  Nontender. Assessment: s/p Procedure(s): Resection of umbilical mass, Partial omentectomy (Bilateral): stable, progressing well and tolerating diet  Plan: Discharge home  LOS: 5 days    Fedor Kazmierski A 06/04/2012, 10:55 AM

## 2012-06-04 NOTE — Discharge Summary (Signed)
Physician Discharge Summary  Patient ID: [name] [name] MRN: [id number] DOB/AGE: 66/02/1945 66 y.o.  Admit date: 05/30/2012 Discharge date: 06/04/2012  Admission Diagnoses:  Ovarian Cancer  Discharge Diagnoses:  Stage IV Ovarian Cancer Principal Problem:   Ovarian cancer   Discharged Condition: good  Hospital Course: Underwent Exploratory Laparotomy, Resection of Umbilical Mass, and partial omentectomy.  There were no intraoperative complications.  Postoperative course was uncomplicated.  Discharged home in good condition.  Consults: None  Significant Diagnostic Studies: labs: CBC, CMET  Treatments: IV hydration, analgesia: Vicodin and anticoagulation: LMW heparin  Discharge Exam: Blood pressure 130/78, pulse 78, temperature 98 F (36.7 C), temperature source Oral, resp. rate 18, height 5\' 6"  (1.676 m), weight 154 lb (69.854 kg), SpO2 92.00%. General appearance: alert and no distress Resp: clear to auscultation bilaterally Cardio: regular rate and rhythm, S1, S2 normal, no murmur, click, rub or gallop GI: normal findings: bowel sounds normal and soft, non-tender Extremities: extremities normal, atraumatic, no cyanosis or edema Incision/Wound:  C, D, I.  Nontender.  Disposition: 01-Home or Self Care  Discharge Orders   Future Appointments Provider Department Dept Phone   06/14/2012 2:30 PM Paola A. Duard Brady, MD [facility] [phone]   07/19/2012 11:45 AM Vesta Mixer, MD [facility] Main Office [place]) [phone]   Future Orders Complete By Expires     (HEART FAILURE PATIENTS) Call MD:  Anytime you have any of the following symptoms: 1) 3 pound weight gain in 24 hours or 5 pounds in 1 week 2) shortness of breath, with or without a dry hacking cough 3) swelling in the hands, feet or stomach 4) if you have to sleep on extra pillows at night in order to breathe.  As directed     Call MD for:  difficulty breathing, headache or  visual disturbances  As directed     Call MD for:  extreme fatigue  As directed     Call MD for:  hives  As directed     Call MD for:  persistant dizziness or light-headedness  As directed     Call MD for:  persistant nausea and vomiting  As directed     Call MD for:  redness, tenderness, or signs of infection (pain, swelling, redness, odor or green/yellow discharge around incision site)  As directed     Call MD for:  severe uncontrolled pain  As directed     Call MD for:  temperature >100.4  As directed     Call MD for:  As directed     Diet - low sodium heart healthy  As directed     Discharge instructions  As directed     Comments:      Routine    Discharge patient  As directed     Driving Restrictions  As directed     Comments:      No driving for 2 weeks.    Increase activity slowly  As directed     Lifting restrictions  As directed     Comments:      No lifting > 20 lbs.    Remove dressing in 24 hours  As directed     Scheduling Instructions:      May apply bandaid to drain site daily.    Sexual Activity Restrictions  As directed     Comments:      No sex for 6 weeks.        Medication List  TAKE these medications       ALPRAZolam 0.25 MG tablet  Commonly known as:  XANAX  Take 1 tablet (0.25 mg total) by mouth every 8 (eight) hours as needed for anxiety.     aspirin 325 MG tablet  Take 325 mg by mouth at bedtime.     atorvastatin 20 MG tablet  Commonly known as:  LIPITOR  Take 20 mg by mouth at bedtime.     carvedilol 25 MG tablet  Commonly known as:  COREG  Take 1 tablet (25 mg total) by mouth 2 (two) times daily.     docusate sodium 100 MG capsule  Commonly known as:  COLACE  Take 200 mg by mouth daily.     EPIPEN 2-PAK 0.3 mg/0.3 mL Devi  Generic drug:  EPINEPHrine  Inject 0.3 mg into the muscle daily as needed (allergic reaction).     fluticasone 50 MCG/ACT nasal spray  Commonly known as:  FLONASE  Place 2 sprays into the nose daily as needed  for allergies.     HYDROcodone-acetaminophen 5-325 MG per tablet  Commonly known as:  NORCO/VICODIN  Take 1-2 tablets by mouth every 4 (four) hours as needed for pain.     HYDROcodone-acetaminophen 5-325 MG per tablet  Commonly known as:  NORCO/VICODIN  Take 1-2 tablets by mouth every 4 (four) hours as needed.     magic mouthwash Soln  Take 5 mLs by mouth 4 (four) times daily as needed (mouth sores).     nitroGLYCERIN 0.4 MG SL tablet  Commonly known as:  NITROSTAT  Place 0.4 mg under the tongue every 5 (five) minutes as needed. Chest pain     omeprazole 20 MG capsule  Commonly known as:  PRILOSEC  Take 1 capsule (20 mg total) by mouth daily.     ondansetron 8 MG tablet  Commonly known as:  ZOFRAN  Take 1 tablet (8 mg total) by mouth 2 (two) times daily. Take two times a day starting the day after chemo for 3 days. Then take two times a day as needed for nausea or vomiting.     oxybutynin 10 MG 24 hr tablet  Commonly known as:  DITROPAN-XL  Take 10 mg by mouth daily.     Polyethyl Glycol-Propyl Glycol 0.4-0.3 % Soln  Place 1 drop into both eyes every morning.     prochlorperazine 10 MG tablet  Commonly known as:  COMPAZINE  Take 10 mg by mouth every 6 (six) hours as needed. For nausea     prochlorperazine 25 MG suppository  Commonly known as:  COMPAZINE  Place 25 mg rectally every 12 (twelve) hours as needed for nausea.     SF 5000 PLUS 1.1 % Crea dental cream  Generic drug:  sodium fluoride  Place 1.1 % onto teeth at bedtime.     SUPER B COMPLEX PO  Take 1 tablet by mouth daily.     trimethoprim 100 MG tablet  Commonly known as:  TRIMPEX  Take 100 mg by mouth every morning.           Follow-up Information   Follow up with CLARKE-PEARSON,DANIEL L, MD. Call in 2 days. (Make appointment for removal of staples.)    Contact information:   [address] [place] [address] [phone]       Signed: HARPER,CHARLES A 06/04/2012, 11:13 AM

## 2012-06-05 NOTE — Progress Notes (Signed)
Discharge summary sent to payer through [facility]  

## 2012-06-06 ENCOUNTER — Other Ambulatory Visit: Payer: Self-pay | Admitting: Oncology

## 2012-06-06 ENCOUNTER — Telehealth: Payer: Self-pay | Admitting: Oncology

## 2012-06-06 NOTE — Telephone Encounter (Signed)
S/w pt re appt for 4/24.  °

## 2012-06-08 ENCOUNTER — Encounter: Payer: Self-pay | Admitting: Oncology

## 2012-06-08 ENCOUNTER — Telehealth: Payer: Self-pay | Admitting: *Deleted

## 2012-06-08 ENCOUNTER — Ambulatory Visit (HOSPITAL_BASED_OUTPATIENT_CLINIC_OR_DEPARTMENT_OTHER): Payer: BC Managed Care – PPO | Admitting: Oncology

## 2012-06-08 ENCOUNTER — Encounter: Payer: Self-pay | Admitting: Gynecologic Oncology

## 2012-06-08 VITALS — BP 150/78 | HR 94 | Temp 98.0°F | Resp 20 | Ht 66.0 in | Wt 155.8 lb

## 2012-06-08 DIAGNOSIS — C569 Malignant neoplasm of unspecified ovary: Secondary | ICD-10-CM

## 2012-06-08 DIAGNOSIS — D0590 Unspecified type of carcinoma in situ of unspecified breast: Secondary | ICD-10-CM

## 2012-06-08 DIAGNOSIS — F329 Major depressive disorder, single episode, unspecified: Secondary | ICD-10-CM

## 2012-06-08 MED ORDER — ALPRAZOLAM 0.25 MG PO TABS: 0.2500 mg | ORAL_TABLET | Freq: Three times a day (TID) | ORAL | Status: AC | PRN

## 2012-06-08 NOTE — Telephone Encounter (Signed)
Per staff message and POF I have scheduled appts.  JMW  

## 2012-06-08 NOTE — Progress Notes (Signed)
OFFICE PROGRESS NOTE  CC KNAPP,[name], MD [address] [place] [address] Dr. Emelia Loron Dr. 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  PRIOR THERAPY: #1. S/P left breast lumpectomy in March 2011 after she had a screen detected 1.0 cm ER+, PR+, intermediate grade DCIS. Patient had 3 sentinel biopsied all of them were negative for metastatic disease. Patient underwent radiation therapy between 06/05/2009 through 07/02/2009. She was then begun on tamoxifen 20 mg daily in August 2011.  #2 new diagnosis of gynecologic malignancy presenting with abdominal mass and pleural effusions. Patient was recently hospitalized with shortness of breath she was discovered to have malignant pleural effusion she is status post Pleurx catheter is. She also had malignant ascites she's had several paracentesis procedures performed during her hospitalization. During her hospitalization she was seen by gynecologic oncology.  #3 patient began neoadjuvant chemotherapy consisting of Taxol and carboplatinum. Her first cycle was administered during her hospitalization. Total of 6 cycles of Taxol and carboplatinum are planned. She has also been seen by gynecologic oncology. She will see them again at completion of 6 cycles.  #4 patient is now status post laparotomy that revealed significant residual disease. I discussed her for pathology from 05/30/2012  CURRENT THERAPY:   INTERVAL HISTORY: [name] [name] 67 y.o. female returns for an interim visit.  She is feeling well but she is very depressed and emotional regarding the intraoperative findings of significant amount of residual disease. She had her staples out today. She is denying having any fevers chills night sweats headaches no shortness of breath or chest pains. She is weak tired and fatigued. She has no bleeding problems. MEDICAL HISTORY: Past Medical History  Diagnosis  Date  . Interstitial cystitis     on chronic antibiotics  . Hyperlipidemia   . Frequent UTI     on prophylaxis  . 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 CAD (coronary artery disease)     a. s/p CABG 7/13;   b.  LHC 12/01/11:  pLAD 70%, mLAD 40%, CFX 40-50% prior to takeoff of the OM2, oRCA occluded, mid vessel filled via R->R collaterals and distal vessel filled by L->R collaterals, S-OM1/OM2 (small and diffusely dz) with mid 90% stenosis, 90% at OM1 anastomotic site, continuation of OM2 occluded, S-Dx patent, S-PDA occluded, L-LAD ok, EF 55-65%  => Med Rx rec.  [name] Hx of echocardiogram     Echo 5/13: EF 60-65%, grade 2 diastolic dysfunction  . Hypercholesterolemia   . Pleural effusion 12/28/11    s/p pleurx catheter  . GERD (gastroesophageal reflux disease)   . Arthritis     "just a little; lower back" (12/28/11)  . Gout attack 08/2011    related to "stress post OHS"  . Depression   . Breast cancer     "left"; on Tamoxifen  . S/P thoracentesis 12/28/11    "for pleural effusion" (12/28/2011)  . Ovarian cancer   . Tachycardia   . Neuromuscular disorder     peripheral neuropathy    ALLERGIES:  is allergic to codeine; isosorbide; other; phenothiazines; talwin; yellow jacket venom; and ambien.  MEDICATIONS:  Current Outpatient Prescriptions  Medication Sig Dispense Refill  . ALPRAZolam (XANAX) 0.25 MG tablet Take 1 tablet (0.25 mg total) by mouth every 8 (eight) hours as needed for anxiety.  30 tablet  4  . aspirin 325 MG tablet Take 325 mg by mouth at bedtime.       [name]  atorvastatin (LIPITOR) 20 MG tablet Take 20 mg by mouth at bedtime.       . B Complex-C (SUPER B COMPLEX PO) Take 1 tablet by mouth daily.      . carvedilol (COREG) 25 MG tablet Take 1 tablet (25 mg total) by mouth 2 (two) times daily.  180 tablet  3  . docusate sodium (COLACE) 100 MG capsule Take 200 mg by mouth daily.      [name] enoxaparin (LOVENOX) 40 MG/0.4ML injection Inject 0.4 mLs (40 mg total) into the skin daily.  14  Syringe  0  . enoxaparin (LOVENOX) KIT 1 kit by Does not apply route once.  1 kit  0  . EPINEPHrine (EPIPEN 2-PAK) 0.3 mg/0.3 mL DEVI Inject 0.3 mg into the muscle daily as needed (allergic reaction).       . fluticasone (FLONASE) 50 MCG/ACT nasal spray Place 2 sprays into the nose daily as needed for allergies.      [name] HYDROcodone-acetaminophen (NORCO/VICODIN) 5-325 MG per tablet Take 1-2 tablets by mouth every 4 (four) hours as needed for pain.      [name] HYDROcodone-acetaminophen (NORCO/VICODIN) 5-325 MG per tablet Take 1-2 tablets by mouth every 4 (four) hours as needed.  40 tablet  2  . nitroGLYCERIN (NITROSTAT) 0.4 MG SL tablet Place 0.4 mg under the tongue every 5 (five) minutes as needed. Chest pain      . omeprazole (PRILOSEC) 20 MG capsule Take 1 capsule (20 mg total) by mouth daily.  30 capsule  6  . oxybutynin (DITROPAN-XL) 10 MG 24 hr tablet Take 10 mg by mouth daily.      Bertram Gala Glycol-Propyl Glycol 0.4-0.3 % SOLN Place 1 drop into both eyes every morning.       . SF 5000 PLUS 1.1 % CREA dental cream Place 1.1 % onto teeth at bedtime.       [name] trimethoprim (TRIMPEX) 100 MG tablet Take 100 mg by mouth every morning.       . Alum & Mag Hydroxide-Simeth (MAGIC MOUTHWASH) SOLN Take 5 mLs by mouth 4 (four) times daily as needed (mouth sores).      . ondansetron (ZOFRAN) 8 MG tablet Take 1 tablet (8 mg total) by mouth 2 (two) times daily. Take two times a day starting the day after chemo for 3 days. Then take two times a day as needed for nausea or vomiting.  30 tablet  1  . prochlorperazine (COMPAZINE) 10 MG tablet Take 10 mg by mouth every 6 (six) hours as needed. For nausea      . prochlorperazine (COMPAZINE) 25 MG suppository Place 25 mg rectally every 12 (twelve) hours as needed for nausea.        No current facility-administered medications for this visit.   Facility-Administered Medications Ordered in Other Visits  Medication Dose Route Frequency Provider Last Rate Last Dose  .  influenza  inactive virus vaccine (FLUZONE/FLUARIX) injection 0.5 mL  0.5 mL Intramuscular Once Joselyn Arrow, MD        SURGICAL HISTORY:  Past Surgical History  Procedure Laterality Date  . Breast lumpectomy  04/2009    left  . Cesarean section  1973; 1976  . Muscle release  1960    L neck and chest.; "when I was 13; pneumonia settled in my left neck"  . Coronary artery bypass graft  08/18/2011    Procedure: CORONARY ARTERY BYPASS GRAFTING (CABG);  Surgeon: Alleen Borne, MD;  Location: [facility] OR;  Service: Open  Heart Surgery;  Laterality: N/A;  Coronary Artery Bypass Graft times five utilizing the left internal mammary artery and the left greater saphenous vein harvested endoscopically.  . Abdominal hysterectomy  1976  . Appendectomy  1976  . Portacath placement  01/06/2012    Procedure: INSERTION PORT-A-CATH;  Surgeon: Alleen Borne, MD;  Location: [facility] OR;  Service: Thoracic;  Laterality: Left;  . Chest tube insertion  01/06/2012    Procedure: INSERTION PLEURAL DRAINAGE CATHETER;  Surgeon: Alleen Borne, MD;  Location: [facility] OR;  Service: Thoracic;  Laterality: Bilateral;  . Removal of pleural drainage catheter Right 04/12/2012    Procedure: MINOR REMOVAL OF PLEURAL DRAINAGE CATHETER;  Surgeon: Alleen Borne, MD;  Location: [facility] OR;  Service: Thoracic;  Laterality: Right;  . Talc pleurodesis Left 04/12/2012    Procedure: Lurlean Nanny;  Surgeon: Alleen Borne, MD;  Location: [facility] OR;  Service: Thoracic;  Laterality: Left;  . Portacath placement Left 04/17/2012    Procedure: INSERTION PORT-A-CATH;  Surgeon: Alleen Borne, MD;  Location: [facility] OR;  Service: Thoracic;  Laterality: Left;  . Removal of pleural drainage catheter Left 04/17/2012    Procedure: REMOVAL OF PLEURAL DRAINAGE CATHETER;  Surgeon: Alleen Borne, MD;  Location: [facility] OR;  Service: Thoracic;  Laterality: Left;  . Port-a-cath removal Left 04/17/2012    Procedure: REMOVAL PORT-A-CATH;  Surgeon: Alleen Borne, MD;  Location: [facility] OR;  Service:  Thoracic;  Laterality: Left;  . Cardiac catheterization    . Laparotomy Bilateral 05/30/2012    Procedure: Resection of umbilical mass, Partial omentectomy;  Surgeon: Jeannette Corpus, MD;  Location: [facility] ORS;  Service: Gynecology;  Laterality: Bilateral;    REVIEW OF SYSTEMS:   General: fatigue (+), night sweats (-), fever (-), pain (-) Lymph: palpable nodes (-) HEENT: vision changes (-), mucositis (-), gum bleeding (-), epistaxis (-) Cardiovascular: chest pain (-), palpitations (-) Pulmonary: shortness of breath (-), dyspnea on exertion (-), cough (-), hemoptysis (-) GI:  Early satiety (-), melena (-), dysphagia (-), nausea/vomiting (-), diarrhea (-) GU: dysuria (-), hematuria (-), incontinence (-) Musculoskeletal: joint swelling (-), joint pain (-), back pain (-) Neuro: weakness (-), numbness (+), headache (-), confusion (-) Skin: Rash (-), lesions (-), dryness (-) Psych: depression (-), suicidal/homicidal ideation (-), feeling of hopelessness (-)   PHYSICAL EXAMINATION:  BP 150/78  Pulse 94  Temp(Src) 98 F (36.7 C) (Oral)  Resp 20  Ht 5\' 6"  (1.676 m)  Wt 155 lb 12.8 oz (70.67 kg)  BMI 25.16 kg/m2  BP lying 112/74, HR 94, BP sitting 116/75, HR 95, BP standing 115/73, HR 105 General: Patient is an ill appearing female in no acute distress HEENT: PERRLA, sclerae anicteric no conjunctival pallor, MMM, fluid in tympanic membranes Neck: supple, no palpable adenopathy Lungs: clear to auscultation bilaterally, no wheezes, rhonchi, or rales Cardiovascular: regular rate rhythm, S1, S2, no murmurs, rubs or gallops Abdomen: Soft, non-tender, non-distended, normoactive bowel sounds, no HSM, pleurx dressings clean dry and intact Extremities: warm and well perfused, no clubbing, cyanosis, or edema Skin: No rashes or lesions Neuro: Non-focal ECOG PERFORMANCE STATUS: 0 - Asymptomatic  LABORATORY DATA: Lab Results  Component Value Date   WBC 7.1 06/02/2012   HGB 9.7* 06/02/2012    HCT 30.0* 06/02/2012   MCV 93.8 06/02/2012   PLT 391 06/02/2012      Chemistry      Component Value Date/Time   NA 134* 06/02/2012 0515   NA 137 05/04/2012 1223   NA  141 12/17/2011 1144   K 4.4 06/02/2012 0515   K 3.5 05/04/2012 1223   CL 100 06/02/2012 0515   CL 101 05/04/2012 1223   CO2 25 06/02/2012 0515   CO2 24 05/04/2012 1223   BUN 20 06/02/2012 0515   BUN 11.2 05/04/2012 1223   BUN 15 12/17/2011 1144   CREATININE 0.76 06/02/2012 0515   CREATININE 0.6 05/04/2012 1223      Component Value Date/Time   CALCIUM 8.7 06/02/2012 0515   CALCIUM 8.9 05/04/2012 1223   ALKPHOS 85 05/26/2012 0955   ALKPHOS 109 04/28/2012 0849   AST 22 05/26/2012 0955   AST 18 04/28/2012 0849   ALT 16 05/26/2012 0955   ALT 48 04/28/2012 0849   BILITOT 0.3 05/26/2012 0955   BILITOT 0.35 04/28/2012 0849       RADIOGRAPHIC STUDIES:  No results found.  ASSESSMENT: 67 year old female with:  #1 DCIS of the breast. She is on tamoxifen 20 mg daily. However I have recommended that we discontinue the tamoxifen for now a do to her recent diagnosis of ovarian cancer.  #2 recent diagnosis of gynecologic malignancy this is ovarian carcinoma). Patient is currently receiving neoadjuvant chemotherapy consisting of Taxol and carboplatinum. Overall she is tolerating it well. Recent CT performed on January 8 showed a minute response to chemotherapy. Total of 6 cycles of Taxol and carboplatinum was given.  #3 patient is now status post laparotomy performed on 05/30/2012. Unfortunately intraoperatively she was found to have significant residual disease. 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 Patient and I discussed the pathology results today. We also discussed further treatment options. This would include systemic chemotherapy. The regimen gemcitabine and carboplatinum with Avastin is a possibility. However I would like to have her recover from  her surgery the next few weeks. She is also scheduled to be seen by Dr. Grant Ruts.  PLAN: #1 we will plan on doing systemic chemotherapy with gemcitabine carboplatinum and possibly Avastin or a different combination. However I would like to wait for a few more weeks for her to recover fully from her surgery.  All questions were answered. The patient knows to call the clinic with any problems, questions or concerns. We can certainly see the patient much sooner if necessary.  I spent 25 minutes counseling the patient face to face. The total time spent in the appointment was 30 minutes.  Drue Second, MD Medical/Oncology [facility] [phone] (beeper) [phone] (Office)  06/08/2012, 4:01 PM  .

## 2012-06-08 NOTE — Progress Notes (Signed)
31 staples removed from midline abdominal incision without difficulty.  No redness or drainage noted.  Steri strips applied.  Reportable signs and symptoms discussed.  Follow up appointment with Dr. Duard Brady on 06/14/12.  Patient describes expected post operative status.  Adequate PO intake reported.  Bowels and bladder functioning without difficulty.  Pain minimal.  Patient to see Dr. Welton Flakes today.

## 2012-06-08 NOTE — Hospital Discharge Follow-Up (Signed)
[name] [name] was admitted for major surgery for ovarian cancer.  She had chronic anemia from the chemotherapy prior to admission.  There was further anemia from acute blood loss during surgery.  She was symptomatic.  With the plan for probable further chemotherapy, and being symptomatic , a decision was made to transfuse to aid in the healing process and the patient's strength and endurance.

## 2012-06-08 NOTE — Telephone Encounter (Signed)
appt made and printed. Emailed MW to add tx. In formed pt after having lab, and seeing KK tx will follow. Pt is aware...td

## 2012-06-08 NOTE — Patient Instructions (Addendum)
I will see ou back in May 9 to possibly start chemotherapy

## 2012-06-14 ENCOUNTER — Encounter: Payer: Self-pay | Admitting: Gynecologic Oncology

## 2012-06-14 ENCOUNTER — Ambulatory Visit: Payer: BC Managed Care – PPO | Attending: Gynecologic Oncology | Admitting: Gynecologic Oncology

## 2012-06-14 VITALS — BP 122/62 | HR 78 | Temp 98.4°F | Resp 16 | Ht 66.0 in | Wt 151.6 lb

## 2012-06-14 DIAGNOSIS — C50919 Malignant neoplasm of unspecified site of unspecified female breast: Secondary | ICD-10-CM | POA: Insufficient documentation

## 2012-06-14 DIAGNOSIS — R05 Cough: Secondary | ICD-10-CM | POA: Insufficient documentation

## 2012-06-14 DIAGNOSIS — I2510 Atherosclerotic heart disease of native coronary artery without angina pectoris: Secondary | ICD-10-CM | POA: Insufficient documentation

## 2012-06-14 DIAGNOSIS — C569 Malignant neoplasm of unspecified ovary: Secondary | ICD-10-CM

## 2012-06-14 DIAGNOSIS — C549 Malignant neoplasm of corpus uteri, unspecified: Secondary | ICD-10-CM | POA: Insufficient documentation

## 2012-06-14 DIAGNOSIS — Z951 Presence of aortocoronary bypass graft: Secondary | ICD-10-CM | POA: Insufficient documentation

## 2012-06-14 DIAGNOSIS — R059 Cough, unspecified: Secondary | ICD-10-CM | POA: Insufficient documentation

## 2012-06-14 DIAGNOSIS — R180 Malignant ascites: Secondary | ICD-10-CM | POA: Insufficient documentation

## 2012-06-14 DIAGNOSIS — N3010 Interstitial cystitis (chronic) without hematuria: Secondary | ICD-10-CM | POA: Insufficient documentation

## 2012-06-14 DIAGNOSIS — E785 Hyperlipidemia, unspecified: Secondary | ICD-10-CM | POA: Insufficient documentation

## 2012-06-14 DIAGNOSIS — K219 Gastro-esophageal reflux disease without esophagitis: Secondary | ICD-10-CM | POA: Insufficient documentation

## 2012-06-14 DIAGNOSIS — Z792 Long term (current) use of antibiotics: Secondary | ICD-10-CM | POA: Insufficient documentation

## 2012-06-14 NOTE — Progress Notes (Signed)
Consult Note: Gyn-Onc  Yisroel Ramming 67 y.o. female  CC:  Chief Complaint  Patient presents with  . Ovarian Cancer    Follow up    HPI: Patient is a 67 year old Caucasian female with a past medical history of coronary artery disease status post CABG earlier this year, unfortunately following a few months after CABG 2 of her grafts were occluded, history of breast cancer on chronic tamoxifen therapy who presented to the hospital for evaluation of the above noted complaints. Per patient her shortness of breath and cough had been going on for at least a month if not more. She claimed that her shortness of breath was mostly exertional, and recently it had started to get worse and she could not even walk to the mailbox without stopping multiple times. Unfortunately over the weekend her shortness of breath and cough got significantly worse, she got in touch with her cardiologist today who advised her to come to the emergency room for further evaluation and treatment. In the emergency room she was found to have a large left sided pleural effusion on a chest x-ray. In addition to the shortness of breath she states her several months essentially for the time of her CABG she has had some abdominal discomfort which she has noticed subjective bloating. She felt some of this is related to her bowels. She has noticed progressive early satiety. [name] not really had any significant nausea and vomiting.   She underwent a thoracentesis on December 29, 2011 which revealed malignant cells. The differential considerations for the cytology and the immunophenotype included primary mammary carcinoma in a primary gynecologic carcinoma. In addition, should a paracentesis on November 18 that revealed malignant cells similar to those seen on her this cytology specimens.   CT imaging revealed:  Findings: Bilateral pleural effusions, left greater than right. Associated airspace consolidations. Heart size within normal  limits. Coronary artery calcification.  Unremarkable liver, spleen, pancreas, biliary system, adrenal glands. Symmetric renal enhancement. No hydronephrosis or hydroureter. No bowel obstruction. No CT evidence for colitis. There is a  moderate amount of ascites. Peritoneal carcinomatosis, with the largest conglomerate in the left paracolic gutter, measuring approximately 7.7 x 6.2 cm and displacing the small bowel centrally. No free intraperitoneal air. No lymphadenopathy.  Thin-walled bladder. Uterus not identified. 5.4 cm right adnexal cyst. Enlarged inguinal lymph nodes, measuring up to 13 mm on the left. There is scattered atherosclerotic calcification of the aorta and its branches. No aneurysmal dilatation.  Multilevel degenerative changes without acute osseous finding. Multiple subcutaneous rounded densities are nonspecific and may  reflect sequelae of prior injection sites, however metastases not excluded.   IMPRESSION:  Bilateral pleural effusions and associated consolidations are partially imaged. Peritoneal carcinomatosis and a moderate amount of ascites. 5.4 cm right adnexal cyst is indeterminate. Inguinal lymphadenopathy.   Ultrasound on November 17 revealed:  Uterus: Surgically absent.  Right ovary: Complex cystic right adnexal mass measuring 3.1 x 5.7 x 4.5 cm with surrounding rim of suspected ovarian tissue.  Left ovary: Not visualized transabdominally/transvaginally.  Other findings: Moderate pelvic ascites with internal echoes.  IMPRESSION:  5.7 cm complex cystic right ovarian lesion. Given the associated findings on CT, this appearance is worrisome for primary ovarian neoplasm.  Status post hysterectomy. Left ovary is not discretely visualized. Correlate with surgical history.  We are asked to see her to offer recommendations regarding the treatment of a possible ovarian or intrapelvic serous carcinoma. Her CA 125 is elevated at 392.9. Her CEA is less than  0.5. In addition her CA  27-29 is in the normal range at 35 but is elevated over her baseline one year ago.    After three cycles of chemotherapy her CT revealed: IMPRESSION:  Trace bilateral pleural effusions with indwelling pleural drains.  Otherwise, no evidence of metastatic disease in the chest.  CT ABDOMEN AND PELVIS  Findings: Peritoneal carcinomatosis overlying the right hepatic dome (series 2/image 52). Liver is otherwise unremarkable.  Spleen, pancreas, and adrenal glands are within normal limits.  Gallbladder is unremarkable. No intrahepatic or extrahepatic ductal dilatation.  Kidneys are within normal limits. No hydronephrosis.  No evidence of bowel obstruction.  Atherosclerotic calcifications of the abdominal aorta and branch vessels.  10 mm short axis retrocaval node (series 2/image 61). Additional small retroperitoneal nodes which do not meet pathologic CT size  criteria. 12 mm right inguinal node (series 2/image 115), previously 10 mm. 15 mm left inguinal node (series 2/image 117),  previously 13 mm.  Near complete resolution of prior abdominopelvic ascites.  Peritoneal carcinomatosis, similar versus mildly improved.  Dominant lesion in the left mid abdomen measures 10.6 x 5.8 cm, previously 12.1 x 6.2 cm when measured in a similar fashion.  Additional omental caking beneath the lower anterior abdominal wall.  7.1 x 4.5 cm complex cystic right ovarian mass (series 2/image 12), previously 6 x 1 x 3.9 cm.  Status post hysterectomy.  Bladder is unremarkable.  Nodularity/thickening in the periumbilical region are worrisome for tumor (series 2/image 82). Additional subcutaneous lesions in the  anterior abdominal wall are nonspecific.  Mild degenerative changes of the visualized thoracolumbar spine.  IMPRESSION:  7.1 x 4.5 cm complex cystic right ovarian mass, mildly increased. Peritoneal carcinomatosis, as described above, stable versus mildly  improved.  Near complete resolution of prior  abdominopelvic ascites.  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  Her CA 125 is also decreased from over 300 at time of diagnosis to 90 in December. We spoke at that time and she decided to proceed with initial 3 cycles of chemotherapy before entertaining of surgery. After her sixth cycle of chemotherapy which was given on March 7 her CT scan revealed Essentially stable disease with no significant improvement. She continues to have extensive omental cake and extensive tumor in the omentum with a similar distribution compared to prior. There is a thickness of left-sided right of tumor stable at 5.4 cm. There were tumor deposits along the left paracolic gutter and cecum were unchanged. The omental tumor deposits similar to prior. The cystic right adnexal mass was unchanged.  Interval History:  She completed her sixth cycle of chemotherapy with paclitaxel and carboplatin. Her CA 125 has most recently started to rise and is in the 120s up from 90's.   After extensive counseling she opted for attempt at interval debulking. On May 30, 2012 she underwent exploratory laparotomy resection umbilical tumor partial omentectomy by Dr. Loree Fee. Surgical findings included extensive ovarian cancer throughout the peritoneal cavity with multiple implants of the small and large bowel serosa and mesentery. In particular, the omentum was entirely replaced with tumor which was encasing the transverse colon and extending up into the splenic flexure. The right ovary was replaced by solid and cystic 7 cm mass. There was extensive peritoneal carcinomatosis. Pelvic peritoneum and rectosigmoid mesentery. At that time it was deemed that she could not have her tumor resected in a meaningful fashion. Dr. Welton Flakes saw the patient on April 24 and discussed the pathology with her.   Her pathology  revealed: Diagnosis 1. 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 2. Omentum, resection for  tumor - HIGH GRADE CARCINOMA WITH CALCIFICATIONS.  Her tumor was sent for chemotherapy resistance testing however, we do not have any results back yet from that.  Review of Systems: She is doing fairly well she does complain of being very tired. She is eating better. She states that each day is getting progressively easier. She did have some nausea and diarrhea immediately postoperatively occurred again. She has been having a dry nonproductive cough. Earlier this week show dry cough and dry heaves. She did take some Compazine which did relieve her cough and the nausea. [name] not really having any significant pain. She has a little bit of right lower quadrant discomfort. She does take a pain pill but every 3 days if she's been up a lot. [name] able to get up without difficulty. She does have some itching around the incision and post-implant of the incision but no real pain.   We had a long discussion today about 30 minutes face to face time discussing goals of care. She became very tearful stating that while she would like to fight her disease she does not when of bleeding that could be sequelae time. Quality of life is very important to her. We do not yet have the results of the chemo resistance testing we called pathology and will followup with the chemotherapy fracture company to see when we may expect some results. I discussed with her that both Dr. Welton Flakes and  I would be completely supportive of trying to identify a active treatment plan that would afford her the opportunity to have the best quality of life possible. Her questions regarding this were answered and she was supported.   Current Meds:  Outpatient Encounter Prescriptions as of 06/14/2012  Medication Sig Dispense Refill  . ALPRAZolam (XANAX) 0.25 MG tablet Take 1 tablet (0.25 mg total) by mouth every 8 (eight) hours as needed for anxiety.  30 tablet  4  . Alum & Mag Hydroxide-Simeth (MAGIC MOUTHWASH) SOLN Take 5 mLs by mouth 4 (four) times  daily as needed (mouth sores).      [name] aspirin 325 MG tablet Take 325 mg by mouth at bedtime.       [name] atorvastatin (LIPITOR) 20 MG tablet Take 20 mg by mouth at bedtime.       . B Complex-C (SUPER B COMPLEX PO) Take 1 tablet by mouth daily.      . carvedilol (COREG) 25 MG tablet Take 1 tablet (25 mg total) by mouth 2 (two) times daily.  180 tablet  3  . docusate sodium (COLACE) 100 MG capsule Take 200 mg by mouth daily.      [name] enoxaparin (LOVENOX) 40 MG/0.4ML injection Inject 0.4 mLs (40 mg total) into the skin daily.  14 Syringe  0  . enoxaparin (LOVENOX) KIT 1 kit by Does not apply route once.  1 kit  0  . EPINEPHrine (EPIPEN 2-PAK) 0.3 mg/0.3 mL DEVI Inject 0.3 mg into the muscle daily as needed (allergic reaction).       . fluticasone (FLONASE) 50 MCG/ACT nasal spray Place 2 sprays into the nose daily as needed for allergies.      [name] HYDROcodone-acetaminophen (NORCO/VICODIN) 5-325 MG per tablet Take 1-2 tablets by mouth every 4 (four) hours as needed for pain.      [name] HYDROcodone-acetaminophen (NORCO/VICODIN) 5-325 MG per tablet Take 1-2 tablets by mouth every 4 (four) hours as needed.  40 tablet  2  . nitroGLYCERIN (NITROSTAT) 0.4 MG SL tablet Place 0.4 mg under the tongue every 5 (five) minutes as needed. Chest pain      . omeprazole (PRILOSEC) 20 MG capsule Take 1 capsule (20 mg total) by mouth daily.  30 capsule  6  . ondansetron (ZOFRAN) 8 MG tablet Take 1 tablet (8 mg total) by mouth 2 (two) times daily. Take two times a day starting the day after chemo for 3 days. Then take two times a day as needed for nausea or vomiting.  30 tablet  1  . oxybutynin (DITROPAN-XL) 10 MG 24 hr tablet Take 10 mg by mouth daily.      Bertram Gala Glycol-Propyl Glycol 0.4-0.3 % SOLN Place 1 drop into both eyes every morning.       . prochlorperazine (COMPAZINE) 10 MG tablet Take 10 mg by mouth every 6 (six) hours as needed. For nausea      . prochlorperazine (COMPAZINE) 25 MG suppository Place 25 mg rectally  every 12 (twelve) hours as needed for nausea.       . SF 5000 PLUS 1.1 % CREA dental cream Place 1.1 % onto teeth at bedtime.       [name] trimethoprim (TRIMPEX) 100 MG tablet Take 100 mg by mouth every morning.        Facility-Administered Encounter Medications as of 06/14/2012  Medication Dose Route Frequency Provider Last Rate Last Dose  . influenza  inactive virus vaccine (FLUZONE/FLUARIX) injection 0.5 mL  0.5 mL Intramuscular Once Joselyn Arrow, MD        Allergy:  Allergies  Allergen Reactions  . Codeine Nausea And Vomiting       . Isosorbide Other (See Comments)    Extreme headaches  . Other Swelling    All Lip moisturizers except Vaseline.  . Phenothiazines Other (See Comments)    Makes her stop breathing.  . Talwin (Pentazocine) Nausea And Vomiting  . Yellow Jacket Venom Anaphylaxis  . Ambien (Zolpidem Tartrate)     Bad dreams    Social Hx:   History   Social History  . Marital Status: Married    Spouse Name: N/A    Number of Children: 1  . Years of Education: N/A   Occupational History  . media Geophysicist/field seismologist and ESL coordinator [employer]   Social History Main Topics  . Smoking status: Former Smoker -- 0.12 packs/day for 15 years    Types: Cigarettes    Quit date: 02/15/2001  . Smokeless tobacco: Never Used  . Alcohol Use: 4.2 oz/week    7 Shots of liquor per week     Comment: 12/28/11 "1 gin & tonic q hs", 2/5/141-2 glass a wine occa  05/26/12 none x 1 year  . Drug Use: No  . Sexually Active: Yes   Other Topics Concern  . Not on file   Social History Narrative   Lives with husband and dog    Past Surgical Hx:  Past Surgical History  Procedure Laterality Date  . Breast lumpectomy  04/2009    left  . Cesarean section  1973; 1976  . Muscle release  1960    L neck and chest.; "when I was 13; pneumonia settled in my left neck"  . Coronary artery bypass graft  08/18/2011    Procedure: CORONARY ARTERY BYPASS GRAFTING (CABG);  Surgeon: Alleen Borne,  MD;  Location: [facility] OR;  Service: Open Heart Surgery;  Laterality: N/A;  Coronary Artery Bypass  Graft times five utilizing the left internal mammary artery and the left greater saphenous vein harvested endoscopically.  . Abdominal hysterectomy  1976  . Appendectomy  1976  . Portacath placement  01/06/2012    Procedure: INSERTION PORT-A-CATH;  Surgeon: Alleen Borne, MD;  Location: [facility] OR;  Service: Thoracic;  Laterality: Left;  . Chest tube insertion  01/06/2012    Procedure: INSERTION PLEURAL DRAINAGE CATHETER;  Surgeon: Alleen Borne, MD;  Location: [facility] OR;  Service: Thoracic;  Laterality: Bilateral;  . Removal of pleural drainage catheter Right 04/12/2012    Procedure: MINOR REMOVAL OF PLEURAL DRAINAGE CATHETER;  Surgeon: Alleen Borne, MD;  Location: [facility] OR;  Service: Thoracic;  Laterality: Right;  . Talc pleurodesis Left 04/12/2012    Procedure: Lurlean Nanny;  Surgeon: Alleen Borne, MD;  Location: [facility] OR;  Service: Thoracic;  Laterality: Left;  . Portacath placement Left 04/17/2012    Procedure: INSERTION PORT-A-CATH;  Surgeon: Alleen Borne, MD;  Location: [facility] OR;  Service: Thoracic;  Laterality: Left;  . Removal of pleural drainage catheter Left 04/17/2012    Procedure: REMOVAL OF PLEURAL DRAINAGE CATHETER;  Surgeon: Alleen Borne, MD;  Location: [facility] OR;  Service: Thoracic;  Laterality: Left;  . Port-a-cath removal Left 04/17/2012    Procedure: REMOVAL PORT-A-CATH;  Surgeon: Alleen Borne, MD;  Location: [facility] OR;  Service: Thoracic;  Laterality: Left;  . Cardiac catheterization    . Laparotomy Bilateral 05/30/2012    Procedure: Resection of umbilical mass, Partial omentectomy;  Surgeon: Jeannette Corpus, MD;  Location: [facility] ORS;  Service: Gynecology;  Laterality: Bilateral;    Past Medical Hx:  Past Medical History  Diagnosis Date  . Interstitial cystitis     on chronic antibiotics  . Hyperlipidemia   . Frequent UTI     on prophylaxis  . 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 CAD (coronary  artery disease)     a. s/p CABG 7/13;   b.  LHC 12/01/11:  pLAD 70%, mLAD 40%, CFX 40-50% prior to takeoff of the OM2, oRCA occluded, mid vessel filled via R->R collaterals and distal vessel filled by L->R collaterals, S-OM1/OM2 (small and diffusely dz) with mid 90% stenosis, 90% at OM1 anastomotic site, continuation of OM2 occluded, S-Dx patent, S-PDA occluded, L-LAD ok, EF 55-65%  => Med Rx rec.  [name] Hx of echocardiogram     Echo 5/13: EF 60-65%, grade 2 diastolic dysfunction  . Hypercholesterolemia   . Pleural effusion 12/28/11    s/p pleurx catheter  . GERD (gastroesophageal reflux disease)   . Arthritis     "just a little; lower back" (12/28/11)  . Gout attack 08/2011    related to "stress post OHS"  . Depression   . Breast cancer     "left"; on Tamoxifen  . S/P thoracentesis 12/28/11    "for pleural effusion" (12/28/2011)  . Ovarian cancer   . Tachycardia   . Neuromuscular disorder     peripheral neuropathy    Family Hx:  Family History  Problem Relation Age of Onset  . Cancer Mother 32    breast cancer  . Heart disease Father 46    MI at 53, CABG in 78's  . Hepatitis Father     C from blood transfusion  . Heart disease Brother     CABG in 57's  . Heart disease Paternal Aunt   . Heart disease Paternal Uncle   . Heart disease Paternal Grandfather   . Diabetes  Neg Hx     Vitals:  Blood pressure 122/62, pulse 78, temperature 98.4 F (36.9 C), temperature source Oral, resp. rate 16, height 5\' 6"  (1.676 m), weight 151 lb 9.6 oz (68.765 kg).  Physical Exam: Well-nourished well-developed female in no acute distress.   Abdomen: Well-healed vertical midline incision. The abdomen is somewhat tense and distended. There is no distinct fluid wave. Incision is healing well. Steri strips were removed.  Extremities: Trace edema.   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 She is malignant ascites consistent with a gynecologic primary. [name] undergone 6 cycles of paclitaxel and carboplatin based chemotherapy.  Unfortunately she has not responded as much to the last 3 cycles of chemotherapy as she did the first 3. And her tumor was more extensive than the CT scan led [place] to believe that she did not have any significant debulking procedure. Tumor was sent for chemotherapy resistance testing. Telford Nab will followup on this and contact the patient as soon as available. She tentatively has a chemotherapy appointment scheduled for May 9. I hope that we have results by that time so that we can have a discussion with her as well as Dr. Welton Flakes, her medical oncologist and determine the best regimen based on the patient's testing as well as balancing her quality of life.  Cleda Mccreedy A., MD 06/14/2012, 2:39 PM

## 2012-06-14 NOTE — Patient Instructions (Signed)
We will call you with testing results.

## 2012-06-16 ENCOUNTER — Other Ambulatory Visit: Payer: Self-pay | Admitting: Gynecologic Oncology

## 2012-06-16 DIAGNOSIS — C569 Malignant neoplasm of unspecified ovary: Secondary | ICD-10-CM

## 2012-06-16 MED ORDER — ENOXAPARIN SODIUM 40 MG/0.4ML ~~LOC~~ SOLN: 40.0000 mg | SUBCUTANEOUS | Status: DC

## 2012-06-16 NOTE — Progress Notes (Signed)
Husband had called requesting refill on lovenox.  She has completed 19 injections since surgery.  Instructed that post-operatively, GYN Onc usually prescribes a total of 28 days.  The husband stated that Dr. Welton Flakes wanted her to continue the medication due to her risk of having a clot.  Spoke with Dr. Welton Flakes, who wanted the patient to continue the medication 40 mg subcut. daily and for her to have 5 refills.  Medication prescribed per Dr. Welton Flakes to [facility].

## 2012-06-20 ENCOUNTER — Telehealth: Payer: Self-pay | Admitting: Dietician

## 2012-06-20 NOTE — Telephone Encounter (Signed)
Brief Outpatient Oncology Nutrition Note  Patient has been identified to be at risk on malnutrition screen.  Wt Readings from Last 10 Encounters:  06/14/12 151 lb 9.6 oz (68.765 kg)  06/08/12 155 lb 12.8 oz (70.67 kg)  05/30/12 154 lb (69.854 kg)  05/30/12 154 lb (69.854 kg)  05/26/12 158 lb (71.668 kg)  05/04/12 163 lb 6.4 oz (74.118 kg)  05/01/12 156 lb (70.761 kg)  04/28/12 156 lb 3.2 oz (70.852 kg)  04/21/12 162 lb (73.483 kg)  04/18/12 162 lb (73.483 kg)      Called and spoke with patient's husband.  Patient's appetite is poor at times.  Will drink Ensure, Ensure pudding, Carnation Instant Breakfast.    Tips given to increase calories/protein.  Husband to continue to encourage.  Oran Rein, RD, LDN

## 2012-06-23 ENCOUNTER — Other Ambulatory Visit (HOSPITAL_BASED_OUTPATIENT_CLINIC_OR_DEPARTMENT_OTHER): Payer: BC Managed Care – PPO | Admitting: Lab

## 2012-06-23 ENCOUNTER — Ambulatory Visit (HOSPITAL_BASED_OUTPATIENT_CLINIC_OR_DEPARTMENT_OTHER): Payer: BC Managed Care – PPO | Admitting: Oncology

## 2012-06-23 ENCOUNTER — Telehealth: Payer: Self-pay | Admitting: Oncology

## 2012-06-23 ENCOUNTER — Ambulatory Visit: Payer: BC Managed Care – PPO

## 2012-06-23 ENCOUNTER — Other Ambulatory Visit: Payer: Self-pay | Admitting: Medical Oncology

## 2012-06-23 VITALS — BP 116/71 | HR 98 | Temp 97.7°F | Resp 20 | Ht 66.0 in | Wt 149.2 lb

## 2012-06-23 DIAGNOSIS — C786 Secondary malignant neoplasm of retroperitoneum and peritoneum: Secondary | ICD-10-CM

## 2012-06-23 DIAGNOSIS — D0590 Unspecified type of carcinoma in situ of unspecified breast: Secondary | ICD-10-CM

## 2012-06-23 DIAGNOSIS — R188 Other ascites: Secondary | ICD-10-CM

## 2012-06-23 DIAGNOSIS — F341 Dysthymic disorder: Secondary | ICD-10-CM

## 2012-06-23 DIAGNOSIS — C50919 Malignant neoplasm of unspecified site of unspecified female breast: Secondary | ICD-10-CM

## 2012-06-23 DIAGNOSIS — Z170 Estrogen receptor positive status [ER+]: Secondary | ICD-10-CM

## 2012-06-23 DIAGNOSIS — C569 Malignant neoplasm of unspecified ovary: Secondary | ICD-10-CM

## 2012-06-23 LAB — COMPREHENSIVE METABOLIC PANEL (CC13)
AST: 20 U/L (ref 5–34)
BUN: 12.4 mg/dL (ref 7.0–26.0)
CO2: 24 mEq/L (ref 22–29)
Calcium: 9.5 mg/dL (ref 8.4–10.4)
Chloride: 105 mEq/L (ref 98–107)
Creatinine: 0.8 mg/dL (ref 0.6–1.1)
Glucose: 125 mg/dl — ABNORMAL HIGH (ref 70–99)

## 2012-06-23 LAB — CBC WITH DIFFERENTIAL/PLATELET
BASO%: 0.6 % (ref 0.0–2.0)
HCT: 35 % (ref 34.8–46.6)
LYMPH%: 14.4 % (ref 14.0–49.7)
MCH: 29.3 pg (ref 25.1–34.0)
MCHC: 32.2 g/dL (ref 31.5–36.0)
MONO#: 0.6 10*3/uL (ref 0.1–0.9)
NEUT%: 67.5 % (ref 38.4–76.8)
Platelets: 521 10*3/uL — ABNORMAL HIGH (ref 145–400)
WBC: 8.1 10*3/uL (ref 3.9–10.3)

## 2012-06-23 MED ORDER — PROCHLORPERAZINE MALEATE 10 MG PO TABS: 10.0000 mg | ORAL_TABLET | Freq: Four times a day (QID) | ORAL | Status: DC | PRN

## 2012-06-23 MED ORDER — VENLAFAXINE HCL ER 37.5 MG PO CP24: 37.5000 mg | ORAL_CAPSULE | Freq: Every day | ORAL | Status: DC

## 2012-06-23 MED ORDER — OMEPRAZOLE 20 MG PO CPDR: 20.0000 mg | DELAYED_RELEASE_CAPSULE | Freq: Two times a day (BID) | ORAL | Status: DC

## 2012-06-23 NOTE — Telephone Encounter (Signed)
Per MD, prescription refill of compazine and effexor phoned in to pt's listed pharmacy. Patient informed. No questions at this time.

## 2012-06-23 NOTE — Patient Instructions (Addendum)
Take effexor 37.5 mg daily  I will see you back on 5/15

## 2012-06-26 NOTE — Progress Notes (Signed)
OFFICE PROGRESS NOTE  CC KNAPP,[name], MD [address] [place] [address] Dr. Emelia Loron Dr. 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  PRIOR THERAPY: #1. S/P left breast lumpectomy in March 2011 after she had a screen detected 1.0 cm ER+, PR+, intermediate grade DCIS. Patient had 3 sentinel biopsied all of them were negative for metastatic disease. Patient underwent radiation therapy between 06/05/2009 through 07/02/2009. She was then begun on tamoxifen 20 mg daily in August 2011.  #2 new diagnosis of gynecologic malignancy presenting with abdominal mass and pleural effusions. Patient was recently hospitalized with shortness of breath she was discovered to have malignant pleural effusion she is status post Pleurx catheter is. She also had malignant ascites she's had several paracentesis procedures performed during her hospitalization. During her hospitalization she was seen by gynecologic oncology.  #3 patient began neoadjuvant chemotherapy consisting of Taxol and carboplatinum. Her first cycle was administered during her hospitalization. Total of 6 cycles of Taxol and carboplatinum are planned. She has also been seen by gynecologic oncology. She will see them again at completion of 6 cycles.  #4 patient is now status post laparotomy that revealed significant residual disease. I discussed her for pathology from 05/30/2012  CURRENT THERAPY:   INTERVAL HISTORY: [name] [name] 67 y.o. female returns for an interim visit.  She is feeling well but she is very depressed and emotional regarding the intraoperative findings of significant amount of residual disease.  She is denying having any fevers chills night sweats headaches no shortness of breath or chest pains. She is weak tired and fatigued. She has no bleeding problems. MEDICAL HISTORY: Past Medical History  Diagnosis Date  . Interstitial cystitis      on chronic antibiotics  . Hyperlipidemia   . Frequent UTI     on prophylaxis  . 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 CAD (coronary artery disease)     a. s/p CABG 7/13;   b.  LHC 12/01/11:  pLAD 70%, mLAD 40%, CFX 40-50% prior to takeoff of the OM2, oRCA occluded, mid vessel filled via R->R collaterals and distal vessel filled by L->R collaterals, S-OM1/OM2 (small and diffusely dz) with mid 90% stenosis, 90% at OM1 anastomotic site, continuation of OM2 occluded, S-Dx patent, S-PDA occluded, L-LAD ok, EF 55-65%  => Med Rx rec.  [name] Hx of echocardiogram     Echo 5/13: EF 60-65%, grade 2 diastolic dysfunction  . Hypercholesterolemia   . Pleural effusion 12/28/11    s/p pleurx catheter  . GERD (gastroesophageal reflux disease)   . Arthritis     "just a little; lower back" (12/28/11)  . Gout attack 08/2011    related to "stress post OHS"  . Depression   . Breast cancer     "left"; on Tamoxifen  . S/P thoracentesis 12/28/11    "for pleural effusion" (12/28/2011)  . Ovarian cancer   . Tachycardia   . Neuromuscular disorder     peripheral neuropathy    ALLERGIES:  is allergic to codeine; isosorbide; other; phenothiazines; talwin; yellow jacket venom; and ambien.  MEDICATIONS:  Current Outpatient Prescriptions  Medication Sig Dispense Refill  . ALPRAZolam (XANAX) 0.25 MG tablet Take 1 tablet (0.25 mg total) by mouth every 8 (eight) hours as needed for anxiety.  30 tablet  4  . aspirin 325 MG tablet Take 325 mg by mouth at bedtime.       [name] atorvastatin (LIPITOR)  20 MG tablet Take 20 mg by mouth at bedtime.       . B Complex-C (SUPER B COMPLEX PO) Take 1 tablet by mouth daily.      . carvedilol (COREG) 25 MG tablet Take 1 tablet (25 mg total) by mouth 2 (two) times daily.  180 tablet  3  . docusate sodium (COLACE) 100 MG capsule Take 200 mg by mouth daily.      [name] enoxaparin (LOVENOX) 40 MG/0.4ML injection Inject 0.4 mLs (40 mg total) into the skin daily.  30 Syringe  5  . enoxaparin (LOVENOX)  KIT 1 kit by Does not apply route once.  1 kit  0  . EPINEPHrine (EPIPEN 2-PAK) 0.3 mg/0.3 mL DEVI Inject 0.3 mg into the muscle daily as needed (allergic reaction).       . fluticasone (FLONASE) 50 MCG/ACT nasal spray Place 2 sprays into the nose daily as needed for allergies.      [name] HYDROcodone-acetaminophen (NORCO/VICODIN) 5-325 MG per tablet Take 1-2 tablets by mouth every 4 (four) hours as needed for pain.      . nitroGLYCERIN (NITROSTAT) 0.4 MG SL tablet Place 0.4 mg under the tongue every 5 (five) minutes as needed. Chest pain      . oxybutynin (DITROPAN-XL) 10 MG 24 hr tablet Take 10 mg by mouth daily.      Bertram Gala Glycol-Propyl Glycol 0.4-0.3 % SOLN Place 1 drop into both eyes every morning.       . SF 5000 PLUS 1.1 % CREA dental cream Place 1.1 % onto teeth at bedtime.       [name] trimethoprim (TRIMPEX) 100 MG tablet Take 100 mg by mouth every morning.       . Alum & Mag Hydroxide-Simeth (MAGIC MOUTHWASH) SOLN Take 5 mLs by mouth 4 (four) times daily as needed (mouth sores).      [name] HYDROcodone-acetaminophen (NORCO/VICODIN) 5-325 MG per tablet Take 1-2 tablets by mouth every 4 (four) hours as needed.  40 tablet  2  . omeprazole (PRILOSEC) 20 MG capsule Take 1 capsule (20 mg total) by mouth 2 (two) times daily.  60 capsule  12  . prochlorperazine (COMPAZINE) 10 MG tablet Take 1 tablet (10 mg total) by mouth every 6 (six) hours as needed. For nausea  30 tablet  3  . prochlorperazine (COMPAZINE) 25 MG suppository Place 25 mg rectally every 12 (twelve) hours as needed for nausea.       [name] venlafaxine XR (EFFEXOR-XR) 37.5 MG 24 hr capsule Take 1 capsule (37.5 mg total) by mouth daily.  30 capsule  3   No current facility-administered medications for this visit.   Facility-Administered Medications Ordered in Other Visits  Medication Dose Route Frequency Provider Last Rate Last Dose  . influenza  inactive virus vaccine (FLUZONE/FLUARIX) injection 0.5 mL  0.5 mL Intramuscular Once Joselyn Arrow, MD         SURGICAL HISTORY:  Past Surgical History  Procedure Laterality Date  . Breast lumpectomy  04/2009    left  . Cesarean section  1973; 1976  . Muscle release  1960    L neck and chest.; "when I was 13; pneumonia settled in my left neck"  . Coronary artery bypass graft  08/18/2011    Procedure: CORONARY ARTERY BYPASS GRAFTING (CABG);  Surgeon: Alleen Borne, MD;  Location: [facility] OR;  Service: Open Heart Surgery;  Laterality: N/A;  Coronary Artery Bypass Graft times five utilizing the left internal mammary artery and the  left greater saphenous vein harvested endoscopically.  . Abdominal hysterectomy  1976  . Appendectomy  1976  . Portacath placement  01/06/2012    Procedure: INSERTION PORT-A-CATH;  Surgeon: Alleen Borne, MD;  Location: [facility] OR;  Service: Thoracic;  Laterality: Left;  . Chest tube insertion  01/06/2012    Procedure: INSERTION PLEURAL DRAINAGE CATHETER;  Surgeon: Alleen Borne, MD;  Location: [facility] OR;  Service: Thoracic;  Laterality: Bilateral;  . Removal of pleural drainage catheter Right 04/12/2012    Procedure: MINOR REMOVAL OF PLEURAL DRAINAGE CATHETER;  Surgeon: Alleen Borne, MD;  Location: [facility] OR;  Service: Thoracic;  Laterality: Right;  . Talc pleurodesis Left 04/12/2012    Procedure: Lurlean Nanny;  Surgeon: Alleen Borne, MD;  Location: [facility] OR;  Service: Thoracic;  Laterality: Left;  . Portacath placement Left 04/17/2012    Procedure: INSERTION PORT-A-CATH;  Surgeon: Alleen Borne, MD;  Location: [facility] OR;  Service: Thoracic;  Laterality: Left;  . Removal of pleural drainage catheter Left 04/17/2012    Procedure: REMOVAL OF PLEURAL DRAINAGE CATHETER;  Surgeon: Alleen Borne, MD;  Location: [facility] OR;  Service: Thoracic;  Laterality: Left;  . Port-a-cath removal Left 04/17/2012    Procedure: REMOVAL PORT-A-CATH;  Surgeon: Alleen Borne, MD;  Location: [facility] OR;  Service: Thoracic;  Laterality: Left;  . Cardiac catheterization    . Laparotomy Bilateral 05/30/2012    Procedure:  Resection of umbilical mass, Partial omentectomy;  Surgeon: Jeannette Corpus, MD;  Location: [facility] ORS;  Service: Gynecology;  Laterality: Bilateral;    REVIEW OF SYSTEMS:   General: fatigue (+), night sweats (-), fever (-), pain (-) Lymph: palpable nodes (-) HEENT: vision changes (-), mucositis (-), gum bleeding (-), epistaxis (-) Cardiovascular: chest pain (-), palpitations (-) Pulmonary: shortness of breath (-), dyspnea on exertion (-), cough (-), hemoptysis (-) GI:  Early satiety (-), melena (-), dysphagia (-), nausea/vomiting (-), diarrhea (-) GU: dysuria (-), hematuria (-), incontinence (-) Musculoskeletal: joint swelling (-), joint pain (-), back pain (-) Neuro: weakness (-), numbness (+), headache (-), confusion (-) Skin: Rash (-), lesions (-), dryness (-) Psych: depression (-), suicidal/homicidal ideation (-), feeling of hopelessness (-)   PHYSICAL EXAMINATION:  BP 116/71  Pulse 98  Temp(Src) 97.7 F (36.5 C) (Oral)  Resp 20  Ht 5\' 6"  (1.676 m)  Wt 149 lb 3.2 oz (67.677 kg)  BMI 24.09 kg/m2  BP lying 112/74, HR 94, BP sitting 116/75, HR 95, BP standing 115/73, HR 105 General: Patient is an ill appearing female in no acute distress HEENT: PERRLA, sclerae anicteric no conjunctival pallor, MMM, fluid in tympanic membranes Neck: supple, no palpable adenopathy Lungs: clear to auscultation bilaterally, no wheezes, rhonchi, or rales Cardiovascular: regular rate rhythm, S1, S2, no murmurs, rubs or gallops Abdomen: Soft, non-tender, non-distended, normoactive bowel sounds, no HSM, pleurx dressings clean dry and intact Extremities: warm and well perfused, no clubbing, cyanosis, or edema Skin: No rashes or lesions Neuro: Non-focal ECOG PERFORMANCE STATUS: 0 - Asymptomatic  LABORATORY DATA: Lab Results  Component Value Date   WBC 8.1 06/23/2012   HGB 11.3* 06/23/2012   HCT 35.0 06/23/2012   MCV 90.9 06/23/2012   PLT 521* 06/23/2012      Chemistry      Component Value Date/Time    NA 140 06/23/2012 1311   NA 134* 06/02/2012 0515   NA 141 12/17/2011 1144   K 3.8 06/23/2012 1311   K 4.4 06/02/2012 0515   CL 105 06/23/2012  1311   CL 100 06/02/2012 0515   CO2 24 06/23/2012 1311   CO2 25 06/02/2012 0515   BUN 12.4 06/23/2012 1311   BUN 20 06/02/2012 0515   BUN 15 12/17/2011 1144   CREATININE 0.8 06/23/2012 1311   CREATININE 0.76 06/02/2012 0515      Component Value Date/Time   CALCIUM 9.5 06/23/2012 1311   CALCIUM 8.7 06/02/2012 0515   ALKPHOS 101 06/23/2012 1311   ALKPHOS 85 05/26/2012 0955   AST 20 06/23/2012 1311   AST 22 05/26/2012 0955   ALT 14 06/23/2012 1311   ALT 16 05/26/2012 0955   BILITOT 0.41 06/23/2012 1311   BILITOT 0.3 05/26/2012 0955       RADIOGRAPHIC STUDIES:  No results found.  ASSESSMENT: 67 year old female with:  #1 DCIS of the breast. She is on tamoxifen 20 mg daily. However I have recommended that we discontinue the tamoxifen for now a do to her recent diagnosis of ovarian cancer.  #2 recent diagnosis of gynecologic malignancy this is ovarian carcinoma). Patient is currently receiving neoadjuvant chemotherapy consisting of Taxol and carboplatinum. Overall she is tolerating it well. Recent CT performed on January 8 showed a minute response to chemotherapy. Total of 6 cycles of Taxol and carboplatinum was given.  #3 patient is now status post laparotomy performed on 05/30/2012. Unfortunately intraoperatively she was found to have significant residual disease. 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 Patient and I discussed the pathology results today. We also discussed further treatment options. This would include systemic chemotherapy. The regimen gemcitabine and carboplatinum with Avastin is a possibility. However I would like to have her recover from her surgery the next few weeks. She is also scheduled to be seen by Dr. Grant Ruts.   #4 Patient's tumor was also sent for  chemotherapy sensitivity testing. Results of this are not back yet. We discussed this again. Patient feels comfortable waiting for chemotherapy until the sensitivity results are back.  #5 depression/anxiety: I do think that patient is depressed to to her diagnosis and the fact that she didn't have a good response to chemotherapy and could not have debulking surgery. I have recommended that she go on Effexor 37.5 mg daily.  PLAN: #1 continue observation.  #20 I see her back in about a week's time. I will also followup on her sensitivity testing.  #3 Effexor XR  37.5 mg daily . Prescription was sent to her pharmacy  All questions were answered. The patient knows to call the clinic with any problems, questions or concerns. We can certainly see the patient much sooner if necessary.  I spent 25 minutes counseling the patient face to face. The total time spent in the appointment was 30 minutes.  Drue Second, MD Medical/Oncology [facility] [phone] (beeper) [phone] (Office)  06/26/2012, 2:08 PM  .

## 2012-06-29 ENCOUNTER — Ambulatory Visit (HOSPITAL_BASED_OUTPATIENT_CLINIC_OR_DEPARTMENT_OTHER): Payer: BC Managed Care – PPO | Admitting: Oncology

## 2012-06-29 ENCOUNTER — Telehealth: Payer: Self-pay | Admitting: Oncology

## 2012-06-29 VITALS — BP 119/77 | HR 69 | Temp 98.1°F | Resp 20 | Ht 66.0 in | Wt 150.8 lb

## 2012-06-29 DIAGNOSIS — D0590 Unspecified type of carcinoma in situ of unspecified breast: Secondary | ICD-10-CM

## 2012-06-29 DIAGNOSIS — C569 Malignant neoplasm of unspecified ovary: Secondary | ICD-10-CM

## 2012-06-30 ENCOUNTER — Telehealth: Payer: Self-pay | Admitting: *Deleted

## 2012-06-30 NOTE — Telephone Encounter (Signed)
Tc to pt and spouse to let them know results from Chemo FX showed inadequate cell growth and requested studies cannot be performed.  As per Dr Duard Brady, will ask f/u appt with Dr Welton Flakes to be moved up and Dr Duard Brady will make recommendation for next chemo to try.  The both express understanding.

## 2012-07-03 NOTE — Progress Notes (Signed)
OFFICE PROGRESS NOTE  CC KNAPP,[name], MD [address] [place] [address] Dr. Emelia Loron Dr. 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  PRIOR THERAPY: #1. S/P left breast lumpectomy in March 2011 after she had a screen detected 1.0 cm ER+, PR+, intermediate grade DCIS. Patient had 3 sentinel biopsied all of them were negative for metastatic disease. Patient underwent radiation therapy between 06/05/2009 through 07/02/2009. She was then begun on tamoxifen 20 mg daily in August 2011.  #2  diagnosis of gynecologic malignancy presenting with abdominal mass and pleural effusions. Patient was recently hospitalized with shortness of breath she was discovered to have malignant pleural effusion she is status post Pleurx catheter is. She also had malignant ascites she's had several paracentesis procedures performed during her hospitalization. During her hospitalization she was seen by gynecologic oncology.  #3 patient began neoadjuvant chemotherapy consisting of Taxol and carboplatinum. Her first cycle was administered during her hospitalization. She has completed aTotal of 6 cycles of Taxol and carboplatinum from 01/07/2012 - 06/23/2012. Overall she tolerated it well except for some fatigue and neuropathies.  #4 patient is  status post laparotomy that revealed significant residual disease. I discussed her for pathology from 05/30/2012  #5 patient is tissue was sent for chemotherapy sensitivity testing. We have yet to receive the results. However based on that we will begin salvage chemotherapy.  CURRENT THERAPY: await sensitivity testing  INTERVAL HISTORY: Yisroel Ramming 67 y.o. female returns for an interim visit.  She was last seen by me one week ago. At that time I did begin her on Effexor XR. She does tell me that this does help her considerably with her emotions. She does not chronically is easily. She  does not have abdominal pain she has not noticed any bloating. She has been eating better. She has no nausea or vomiting. She does have some ongoing fatigue. She also has ongoing neuropathic type of pain. She has no fevers chills or night sweats. Remainder of the 10 point review of systems is negative.  MEDICAL HISTORY: Past Medical History  Diagnosis Date  . Interstitial cystitis     on chronic antibiotics  . Hyperlipidemia   . Frequent UTI     on prophylaxis  . 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 CAD (coronary artery disease)     a. s/p CABG 7/13;   b.  LHC 12/01/11:  pLAD 70%, mLAD 40%, CFX 40-50% prior to takeoff of the OM2, oRCA occluded, mid vessel filled via R->R collaterals and distal vessel filled by L->R collaterals, S-OM1/OM2 (small and diffusely dz) with mid 90% stenosis, 90% at OM1 anastomotic site, continuation of OM2 occluded, S-Dx patent, S-PDA occluded, L-LAD ok, EF 55-65%  => Med Rx rec.  [name] Hx of echocardiogram     Echo 5/13: EF 60-65%, grade 2 diastolic dysfunction  . Hypercholesterolemia   . Pleural effusion 12/28/11    s/p pleurx catheter  . GERD (gastroesophageal reflux disease)   . Arthritis     "just a little; lower back" (12/28/11)  . Gout attack 08/2011    related to "stress post OHS"  . Depression   . Breast cancer     "left"; on Tamoxifen  . S/P thoracentesis 12/28/11    "for pleural effusion" (12/28/2011)  . Ovarian cancer   . Tachycardia   . Neuromuscular disorder     peripheral neuropathy    ALLERGIES:  is allergic to  codeine; isosorbide; other; phenothiazines; talwin; yellow jacket venom; and ambien.  MEDICATIONS:  Current Outpatient Prescriptions  Medication Sig Dispense Refill  . ALPRAZolam (XANAX) 0.25 MG tablet Take 1 tablet (0.25 mg total) by mouth every 8 (eight) hours as needed for anxiety.  30 tablet  4  . aspirin 325 MG tablet Take 325 mg by mouth at bedtime.       [name] atorvastatin (LIPITOR) 20 MG tablet Take 20 mg by mouth at bedtime.       .  B Complex-C (SUPER B COMPLEX PO) Take 1 tablet by mouth daily.      . carvedilol (COREG) 25 MG tablet Take 1 tablet (25 mg total) by mouth 2 (two) times daily.  180 tablet  3  . docusate sodium (COLACE) 100 MG capsule Take 200 mg by mouth daily.      [name] enoxaparin (LOVENOX) 40 MG/0.4ML injection Inject 0.4 mLs (40 mg total) into the skin daily.  30 Syringe  5  . enoxaparin (LOVENOX) KIT 1 kit by Does not apply route once.  1 kit  0  . EPINEPHrine (EPIPEN 2-PAK) 0.3 mg/0.3 mL DEVI Inject 0.3 mg into the muscle daily as needed (allergic reaction).       . fluticasone (FLONASE) 50 MCG/ACT nasal spray Place 2 sprays into the nose daily as needed for allergies.      [name] HYDROcodone-acetaminophen (NORCO/VICODIN) 5-325 MG per tablet Take 1-2 tablets by mouth every 4 (four) hours as needed.  40 tablet  2  . omeprazole (PRILOSEC) 20 MG capsule Take 1 capsule (20 mg total) by mouth 2 (two) times daily.  60 capsule  12  . oxybutynin (DITROPAN-XL) 10 MG 24 hr tablet Take 10 mg by mouth daily.      Bertram Gala Glycol-Propyl Glycol 0.4-0.3 % SOLN Place 1 drop into both eyes every morning.       . prochlorperazine (COMPAZINE) 10 MG tablet Take 1 tablet (10 mg total) by mouth every 6 (six) hours as needed. For nausea  30 tablet  3  . SF 5000 PLUS 1.1 % CREA dental cream Place 1.1 % onto teeth at bedtime.       [name] trimethoprim (TRIMPEX) 100 MG tablet Take 100 mg by mouth every morning.       . venlafaxine XR (EFFEXOR-XR) 37.5 MG 24 hr capsule Take 1 capsule (37.5 mg total) by mouth daily.  30 capsule  3  . Alum & Mag Hydroxide-Simeth (MAGIC MOUTHWASH) SOLN Take 5 mLs by mouth 4 (four) times daily as needed (mouth sores).      [name] HYDROcodone-acetaminophen (NORCO/VICODIN) 5-325 MG per tablet Take 1-2 tablets by mouth every 4 (four) hours as needed for pain.      . nitroGLYCERIN (NITROSTAT) 0.4 MG SL tablet Place 0.4 mg under the tongue every 5 (five) minutes as needed. Chest pain      . prochlorperazine (COMPAZINE) 25 MG  suppository Place 25 mg rectally every 12 (twelve) hours as needed for nausea.        No current facility-administered medications for this visit.   Facility-Administered Medications Ordered in Other Visits  Medication Dose Route Frequency Provider Last Rate Last Dose  . influenza  inactive virus vaccine (FLUZONE/FLUARIX) injection 0.5 mL  0.5 mL Intramuscular Once Joselyn Arrow, MD        SURGICAL HISTORY:  Past Surgical History  Procedure Laterality Date  . Breast lumpectomy  04/2009    left  . Cesarean section  1973; 1976  .  Muscle release  1960    L neck and chest.; "when I was 13; pneumonia settled in my left neck"  . Coronary artery bypass graft  08/18/2011    Procedure: CORONARY ARTERY BYPASS GRAFTING (CABG);  Surgeon: Alleen Borne, MD;  Location: [facility] OR;  Service: Open Heart Surgery;  Laterality: N/A;  Coronary Artery Bypass Graft times five utilizing the left internal mammary artery and the left greater saphenous vein harvested endoscopically.  . Abdominal hysterectomy  1976  . Appendectomy  1976  . Portacath placement  01/06/2012    Procedure: INSERTION PORT-A-CATH;  Surgeon: Alleen Borne, MD;  Location: [facility] OR;  Service: Thoracic;  Laterality: Left;  . Chest tube insertion  01/06/2012    Procedure: INSERTION PLEURAL DRAINAGE CATHETER;  Surgeon: Alleen Borne, MD;  Location: [facility] OR;  Service: Thoracic;  Laterality: Bilateral;  . Removal of pleural drainage catheter Right 04/12/2012    Procedure: MINOR REMOVAL OF PLEURAL DRAINAGE CATHETER;  Surgeon: Alleen Borne, MD;  Location: [facility] OR;  Service: Thoracic;  Laterality: Right;  . Talc pleurodesis Left 04/12/2012    Procedure: Lurlean Nanny;  Surgeon: Alleen Borne, MD;  Location: [facility] OR;  Service: Thoracic;  Laterality: Left;  . Portacath placement Left 04/17/2012    Procedure: INSERTION PORT-A-CATH;  Surgeon: Alleen Borne, MD;  Location: [facility] OR;  Service: Thoracic;  Laterality: Left;  . Removal of pleural drainage catheter Left  04/17/2012    Procedure: REMOVAL OF PLEURAL DRAINAGE CATHETER;  Surgeon: Alleen Borne, MD;  Location: [facility] OR;  Service: Thoracic;  Laterality: Left;  . Port-a-cath removal Left 04/17/2012    Procedure: REMOVAL PORT-A-CATH;  Surgeon: Alleen Borne, MD;  Location: [facility] OR;  Service: Thoracic;  Laterality: Left;  . Cardiac catheterization    . Laparotomy Bilateral 05/30/2012    Procedure: Resection of umbilical mass, Partial omentectomy;  Surgeon: Jeannette Corpus, MD;  Location: [facility] ORS;  Service: Gynecology;  Laterality: Bilateral;    REVIEW OF SYSTEMS:   General: fatigue (+), night sweats (-), fever (-), pain (-) Lymph: palpable nodes (-) HEENT: vision changes (-), mucositis (-), gum bleeding (-), epistaxis (-) Cardiovascular: chest pain (-), palpitations (-) Pulmonary: shortness of breath (-), dyspnea on exertion (-), cough (-), hemoptysis (-) GI:  Early satiety (-), melena (-), dysphagia (-), nausea/vomiting (-), diarrhea (-) GU: dysuria (-), hematuria (-), incontinence (-) Musculoskeletal: joint swelling (-), joint pain (-), back pain (-) Neuro: weakness (-), numbness (+), headache (-), confusion (-) Skin: Rash (-), lesions (-), dryness (-) Psych: depression (-), suicidal/homicidal ideation (-), feeling of hopelessness (-)   PHYSICAL EXAMINATION:  BP 119/77  Pulse 69  Temp(Src) 98.1 F (36.7 C) (Oral)  Resp 20  Ht 5\' 6"  (1.676 m)  Wt 150 lb 12.8 oz (68.402 kg)  BMI 24.35 kg/m2  BP lying 112/74, HR 94, BP sitting 116/75, HR 95, BP standing 115/73, HR 105 General: Patient is an ill appearing female in no acute distress HEENT: PERRLA, sclerae anicteric no conjunctival pallor, MMM, fluid in tympanic membranes Neck: supple, no palpable adenopathy Lungs: clear to auscultation bilaterally, no wheezes, rhonchi, or rales Cardiovascular: regular rate rhythm, S1, S2, no murmurs, rubs or gallops Abdomen: Soft, non-tender, non-distended, normoactive bowel sounds, no HSM, Extremities:  warm and well perfused, no clubbing, cyanosis, or edema Skin: No rashes or lesions Neuro: Non-focal ECOG PERFORMANCE STATUS: 0 - Asymptomatic  LABORATORY DATA: Lab Results  Component Value Date   WBC 8.1 06/23/2012   HGB 11.3*  06/23/2012   HCT 35.0 06/23/2012   MCV 90.9 06/23/2012   PLT 521* 06/23/2012      Chemistry      Component Value Date/Time   NA 140 06/23/2012 1311   NA 134* 06/02/2012 0515   NA 141 12/17/2011 1144   K 3.8 06/23/2012 1311   K 4.4 06/02/2012 0515   CL 105 06/23/2012 1311   CL 100 06/02/2012 0515   CO2 24 06/23/2012 1311   CO2 25 06/02/2012 0515   BUN 12.4 06/23/2012 1311   BUN 20 06/02/2012 0515   BUN 15 12/17/2011 1144   CREATININE 0.8 06/23/2012 1311   CREATININE 0.76 06/02/2012 0515      Component Value Date/Time   CALCIUM 9.5 06/23/2012 1311   CALCIUM 8.7 06/02/2012 0515   ALKPHOS 101 06/23/2012 1311   ALKPHOS 85 05/26/2012 0955   AST 20 06/23/2012 1311   AST 22 05/26/2012 0955   ALT 14 06/23/2012 1311   ALT 16 05/26/2012 0955   BILITOT 0.41 06/23/2012 1311   BILITOT 0.3 05/26/2012 0955       RADIOGRAPHIC STUDIES:  No results found.  ASSESSMENT: 67 year old female with:  #1 DCIS of the breast. She is on tamoxifen 20 mg daily. However I have recommended that we discontinue the tamoxifen for now a do to her recent diagnosis of ovarian cancer.  #2 recent diagnosis of gynecologic malignancy this is ovarian carcinoma). Patient is currently receiving neoadjuvant chemotherapy consisting of Taxol and carboplatinum. Overall she is tolerating it well. Recent CT performed on January 8 showed a minute response to chemotherapy. Total of 6 cycles of Taxol and carboplatinum was given.  #3 patient is now status post laparotomy performed on 05/30/2012. Unfortunately intraoperatively she was found to have significant residual disease. 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 Patient and I discussed the  pathology results today. We also discussed further treatment options. This would include systemic chemotherapy. The regimen gemcitabine and carboplatinum with Avastin is a possibility. However I would like to have her recover from her surgery the next few weeks. She is also scheduled to be seen by Dr. Grant Ruts.   #4 Patient's tumor was also sent for chemotherapy sensitivity testing. Results of this are not back yet. We discussed this again. Patient feels comfortable waiting for chemotherapy until the sensitivity results are back.  #5 depression/anxiety: Currently on Effexor with good response  PLAN: #1 We will await results of the chemotherapy sensitive testing. I have explained this to them. I spoke to Telford Nab she does tell me that the results should be back in the next one to 2 weeks.  #2 I will see them back in about a week to 2 weeks time for followup. Hopefully by then we can get him started on chemotherapy.  All questions were answered. The patient knows to call the clinic with any problems, questions or concerns. We can certainly see the patient much sooner if necessary.  I spent 25 minutes counseling the patient face to face. The total time spent in the appointment was 30 minutes.  Drue Second, MD Medical/Oncology [facility] [phone] (beeper) [phone] (Office)  07/03/2012, 9:14 PM  .

## 2012-07-04 ENCOUNTER — Other Ambulatory Visit: Payer: Self-pay | Admitting: Oncology

## 2012-07-05 ENCOUNTER — Telehealth: Payer: Self-pay | Admitting: Oncology

## 2012-07-11 ENCOUNTER — Ambulatory Visit (HOSPITAL_BASED_OUTPATIENT_CLINIC_OR_DEPARTMENT_OTHER): Payer: BC Managed Care – PPO | Admitting: Oncology

## 2012-07-11 VITALS — BP 117/72 | HR 91 | Temp 98.3°F | Resp 20 | Ht 66.0 in | Wt 150.3 lb

## 2012-07-11 DIAGNOSIS — C569 Malignant neoplasm of unspecified ovary: Secondary | ICD-10-CM

## 2012-07-11 MED ORDER — VENLAFAXINE HCL ER 75 MG PO CP24: ORAL_CAPSULE | ORAL | Status: DC

## 2012-07-11 MED ORDER — ONDANSETRON HCL 8 MG PO TABS: 8.0000 mg | ORAL_TABLET | Freq: Two times a day (BID) | ORAL | Status: DC

## 2012-07-11 MED ORDER — PROCHLORPERAZINE MALEATE 10 MG PO TABS: 10.0000 mg | ORAL_TABLET | Freq: Four times a day (QID) | ORAL | Status: DC | PRN

## 2012-07-11 MED ORDER — GABAPENTIN 100 MG PO CAPS
ORAL_CAPSULE | ORAL | Status: DC
Start: 2012-07-11 — End: 2013-04-20

## 2012-07-11 MED ORDER — DEXAMETHASONE 4 MG PO TABS: 8.0000 mg | ORAL_TABLET | Freq: Two times a day (BID) | ORAL | Status: DC

## 2012-07-11 MED ORDER — LORAZEPAM 0.5 MG PO TABS: 0.5000 mg | ORAL_TABLET | Freq: Four times a day (QID) | ORAL | Status: DC | PRN

## 2012-07-11 MED ORDER — PROCHLORPERAZINE 25 MG RE SUPP: 25.0000 mg | Freq: Two times a day (BID) | RECTAL | Status: DC | PRN

## 2012-07-11 NOTE — Patient Instructions (Addendum)
#1 we discussed treating you with medications noted below. We will begin on 07/24/12.  #2 gabapentin 100mg  three times a day for the neuropathy you are expereincing  #3 I have increased the dose of the Effexor XR to 75 mg daily (HAPPY PILL)  Topotecan injection What is this medicine? TOPOTECAN (TOE poe TEE kan) is a chemotherapy drug. It is used to treat lung cancer, ovarian cancer, and cervical cancer. This medicine may be used for other purposes; ask your health care provider or pharmacist if you have questions. What should I tell my health care provider before I take this medicine? They need to know if you have any of these conditions: -blood disorders -dehydration -diarrhea -immune system problems -infection (especially a virus infection such as chickenpox, cold sores, or herpes) -kidney disease -low blood counts, like low white cell, platelet, or red cell counts -recent or ongoing radiation therapy -an unusual or allergic reaction to topotecan, other medicines, foods, dyes, or preservatives -pregnant or trying to get pregnant -breast-feeding How should I use this medicine? This medicine is for infusion into a vein. It is usually given by a health care professional in a hospital or clinic setting. In rare cases, you might get this medicine at home. You will be taught how to give this medicine. Use exactly as directed. Take your medicine at regular intervals. Do not take your medicine more often than directed. It is important that you put your used needles and syringes in a special sharps container. Do not put them in a trash can. If you do not have a sharps container, call your pharmacist or healthcare provider to get one. Talk to your pediatrician regarding the use of this medicine in children. Special care may be needed. Overdosage: If you think you have taken too much of this medicine contact a poison control center or emergency room at once. NOTE: This medicine is only for you. Do  not share this medicine with others. What if I miss a dose? It is important not to miss your dose. Call your doctor or health care professional if you are unable to keep an appointment. What may interact with this medicine? -amiodarone -antiviral medicines for HIV or AIDS -cisplatin -clarithromycin -cyclosporine -diltiazem -erythromycin -grapefruit or grapefruit juice -medicines for fungal infections like ketoconazole and itraconazole -mefloquine -mifepristone, RU-486 -nicardipine -phenytoin -propafenone -quinidine -tacrolimus -tamoxifen -testosterone -vaccines -verapamil Talk to your prescriber or health care professional before taking any of these medicines: -aspirin -acetaminophen -ibuprofen -naproxen -ketoprofen This list may not describe all possible interactions. Give your health care provider a list of all the medicines, herbs, non-prescription drugs, or dietary supplements you use. Also tell them if you smoke, drink alcohol, or use illegal drugs. Some items may interact with your medicine. What should I watch for while using this medicine? This drug may make you feel generally unwell. This is not uncommon, as chemotherapy can affect healthy cells as well as cancer cells. Report any side effects. Continue your course of treatment even though you feel ill unless your doctor tells you to stop. Call your doctor or health care professional for advice if you get a fever, chills or sore throat, or other symptoms of a cold or flu. Do not treat yourself. This drug decreases your body's ability to fight infections. Try to avoid being around people who are sick. This medicine may increase your risk to bruise or bleed. Call your doctor or health care professional if you notice any unusual bleeding. Be careful brushing and  flossing your teeth or using a toothpick because you may get an infection or bleed more easily. If you have any dental work done, tell your dentist you are receiving  this medicine. Avoid taking products that contain aspirin, acetaminophen, ibuprofen, naproxen, or ketoprofen unless instructed by your doctor. These medicines may hide a fever. Do not become pregnant while taking this medicine. Women should inform their doctor if they wish to become pregnant or think they might be pregnant. There is a potential for serious side effects to an unborn child. Talk to your health care professional or pharmacist for more information. Do not breast-feed an infant while taking this medicine. What side effects may I notice from receiving this medicine? Side effects that you should report to your doctor or health care professional as soon as possible: -allergic reactions like skin rash, itching or hives, swelling of the face, lips, or tongue -breathing difficulties -diarrhea -dizziness -fever or chills, sore throat -mouth sores or pain -pain, tingling, numbness in the hands or feet -unusual bleeding or bruising -unusually weak or tired -yellowing of the eyes or skin Side effects that usually do not require medical attention (report to your doctor or health care professional if they continue or are bothersome): -hair loss -headache -loss of appetite -nausea, vomiting -stomach pain This list may not describe all possible side effects. Call your doctor for medical advice about side effects. You may report side effects to FDA at 1-800-FDA-1088. Where should I keep my medicine? Keep out of the reach of children. This drug is usually given in a hospital or clinic and will not be stored at home. In rare cases, this medicine may be given at home. If you are using this medicine at home, you will be instructed on how to store this medicine. Throw away any unused medicine after the expiration date on the label. NOTE: This sheet is a summary. It may not cover all possible information. If you have questions about this medicine, talk to your doctor, pharmacist, or health care  provider.  2012, Elsevier/Gold Standard. (10/18/2007 5:25:53 PM)    Bevacizumab injection What is this medicine? BEVACIZUMAB (be va SIZ yoo mab) is a chemotherapy drug. It targets a protein found in many cancer cell types, and halts cancer growth. This drug treats many cancers including non-small cell lung cancer, and colon or rectal cancer. It is usually given with other chemotherapy drugs. This medicine may be used for other purposes; ask your health care provider or pharmacist if you have questions. What should I tell my health care provider before I take this medicine? They need to know if you have any of these conditions: -blood clots -heart disease, including heart failure, heart attack, or chest pain (angina) -high blood pressure -infection (especially a virus infection such as chickenpox, cold sores, or herpes) -kidney disease -lung disease -prior chemotherapy with doxorubicin, daunorubicin, epirubicin, or other anthracycline type chemotherapy agents -recent or ongoing radiation therapy -recent surgery -stroke -an unusual or allergic reaction to bevacizumab, hamster proteins, mouse proteins, other medicines, foods, dyes, or preservatives -pregnant or trying to get pregnant -breast-feeding How should I use this medicine? This medicine is for infusion into a vein. It is given by a health care professional in a hospital or clinic setting. Talk to your pediatrician regarding the use of this medicine in children. Special care may be needed. Overdosage: If you think you have taken too much of this medicine contact a poison control center or emergency room at once. NOTE: This  medicine is only for you. Do not share this medicine with others. What if I miss a dose? It is important not to miss your dose. Call your doctor or health care professional if you are unable to keep an appointment. What may interact with this medicine? Interactions are not expected. This list may not describe  all possible interactions. Give your health care provider a list of all the medicines, herbs, non-prescription drugs, or dietary supplements you use. Also tell them if you smoke, drink alcohol, or use illegal drugs. Some items may interact with your medicine. What should I watch for while using this medicine? Your condition will be monitored carefully while you are receiving this medicine. You will need important blood work and urine testing done while you are taking this medicine. During your treatment, let your health care professional know if you have any unusual symptoms, such as difficulty breathing. This medicine may rarely cause 'gastrointestinal perforation' (holes in the stomach, intestines or colon), a serious side effect requiring surgery to repair. This medicine should be started at least 28 days following major surgery and the site of the surgery should be totally healed. Check with your doctor before scheduling dental work or surgery while you are receiving this treatment. Talk to your doctor if you have recently had surgery or if you have a wound that has not healed. Do not become pregnant while taking this medicine. Women should inform their doctor if they wish to become pregnant or think they might be pregnant. There is a potential for serious side effects to an unborn child. Talk to your health care professional or pharmacist for more information. Do not breast-feed an infant while taking this medicine. This medicine has caused ovarian failure in some women. This medicine may interfere with the ability to have a child. You should talk to your doctor or health care professional if you are concerned about your fertility. What side effects may I notice from receiving this medicine? Side effects that you should report to your doctor or health care professional as soon as possible: -allergic reactions like skin rash, itching or hives, swelling of the face, lips, or tongue -signs of infection  - fever or chills, cough, sore throat, pain or trouble passing urine -signs of decreased platelets or bleeding - bruising, pinpoint red spots on the skin, black, tarry stools, nosebleeds, blood in the urine -breathing problems -changes in vision -chest pain -confusion -jaw pain, especially after dental work -mouth sores -seizures -severe abdominal pain -severe headache -sudden numbness or weakness of the face, arm or leg -swelling of legs or ankles -symptoms of a stroke: change in mental awareness, inability to talk or move one side of the body (especially in patients with lung cancer) -trouble passing urine or change in the amount of urine -trouble speaking or understanding -trouble walking, dizziness, loss of balance or coordination Side effects that usually do not require medical attention (report to your doctor or health care professional if they continue or are bothersome): -constipation -diarrhea -dry skin -headache -loss of appetite -nausea, vomiting This list may not describe all possible side effects. Call your doctor for medical advice about side effects. You may report side effects to FDA at 1-800-FDA-1088. Where should I keep my medicine? This drug is given in a hospital or clinic and will not be stored at home. NOTE: This sheet is a summary. It may not cover all possible information. If you have questions about this medicine, talk to your doctor, pharmacist, or health  care provider.  2013, Elsevier/Gold Standard. (01/02/2010 4:25:37 PM)

## 2012-07-12 ENCOUNTER — Telehealth: Payer: Self-pay | Admitting: *Deleted

## 2012-07-12 NOTE — Telephone Encounter (Signed)
Per staff message and POF I have scheduled appts.  JMW  

## 2012-07-19 ENCOUNTER — Ambulatory Visit (INDEPENDENT_AMBULATORY_CARE_PROVIDER_SITE_OTHER): Payer: BC Managed Care – PPO | Admitting: Cardiovascular Disease

## 2012-07-19 ENCOUNTER — Encounter: Payer: Self-pay | Admitting: Cardiovascular Disease

## 2012-07-19 VITALS — BP 120/68 | HR 84 | Ht 66.0 in | Wt 151.2 lb

## 2012-07-19 DIAGNOSIS — I2510 Atherosclerotic heart disease of native coronary artery without angina pectoris: Secondary | ICD-10-CM

## 2012-07-19 NOTE — Patient Instructions (Addendum)
Your physician wants you to follow-up in: 6 months  You will receive a reminder letter in the mail two months in advance. If you don't receive a letter, please call our office to schedule the follow-up appointment.  Your physician recommends that you continue on your current medications as directed. Please refer to the Current Medication list given to you today.  

## 2012-07-19 NOTE — Assessment & Plan Note (Signed)
[name] [name]  seems to be doing fairly well. [name] not had any episodes of angina. She continues to fight her ovarian cancer. I'm pleased that she's not having any chest pains. I see her back in 6 months.

## 2012-07-19 NOTE — Progress Notes (Signed)
[name] [name] Date of Birth  August 20, 1945       [facility]    [place] [address]  [address] [place], [place]  [address]   [place], [place]  [address] [phone]     [phone]   Fax  [phone]    Fax [phone]  Problem List: 1. CAD- CABG 2. Breast cancer - on Tamoxifin  3. Hyperlipidemia  4. Interstitial cystitis  History of Present Illness:  [name] [name] has not been doing very well.  She presented with CP and cardiac cath revealed that the saphenous vein graft to the left circumflex artery was severely diseased. Her native circumflex artery has only minor luminal irregularities. The saphenous vein graft to right coronary artery was also found to be occluded and her native right coronary artery is chronically occluded.  Since that time she's been tried on medical therapy.  We doubled her metoprol and added isosorbide.. This has caused profound fatigue. She also has had severe shortness of breath, and severe headache. She has stopped the isosorbide ( because of the headache).  She also has been having lots of acid reflux and a chronic cough.  She has noted exertional dyspnea, and cough.  April 18, 2012:  That he has had a rough time since I last saw her. She has been receiving chemotherapy for ovarian cancer. She has had bilateral Pleurx tubes for chronic pleural effusions appear she's had talc placement into her pleural space.  She has just completed her chemotherapy and is now scheduled have surgical debridement of her tumor and ovaries. We have obtained an echocardiogram which reveals normal left systolic function. [name] not having any episodes of chest pain or shortness breath. She is very fatigued and quite deconditioned.  July 19, 2012:  She has had abdomina surgery since I last saw her.  She did well from a cardiac standpoint.  She still has lots of tumors and will be starting another round of chemotherapy.     Current Outpatient Prescriptions on File Prior to Visit  Medication Sig Dispense Refill  . ALPRAZolam (XANAX) 0.25 MG tablet Take 1 tablet (0.25 mg total) by mouth every 8 (eight) hours as needed for anxiety.  30 tablet  4  . Alum & Mag Hydroxide-Simeth (MAGIC MOUTHWASH) SOLN Take 5 mLs by mouth 4 (four) times daily as needed (mouth sores).      [name] aspirin 325 MG tablet Take 325 mg by mouth at bedtime.       [name] atorvastatin (LIPITOR) 20 MG tablet Take 20 mg by mouth at bedtime.       . B Complex-C (SUPER B COMPLEX PO) Take 1 tablet by mouth daily.      . carvedilol (COREG) 25 MG tablet Take 1 tablet (25 mg total) by mouth 2 (two) times daily.  180 tablet  3  . dexamethasone (DECADRON) 4 MG tablet Take 2 tablets (8 mg total) by mouth 2 (two) times daily with a meal. Take daily starting the day after chemotherapy for 2 days. Take with food.  30 tablet  1  . docusate sodium (COLACE) 100 MG capsule Take 200 mg by mouth daily.      [name] EPINEPHrine (EPIPEN 2-PAK) 0.3 mg/0.3 mL DEVI Inject 0.3 mg into the muscle daily as needed (allergic reaction).       . fluticasone (FLONASE) 50 MCG/ACT nasal spray Place 2 sprays into the nose daily as needed for allergies.      [name]  gabapentin (NEURONTIN) 100 MG capsule Take 100 mg orally three times a day  90 capsule  6  . HYDROcodone-acetaminophen (NORCO/VICODIN) 5-325 MG per tablet Take 1-2 tablets by mouth every 4 (four) hours as needed.  40 tablet  2  . LORazepam (ATIVAN) 0.5 MG tablet Take 1 tablet (0.5 mg total) by mouth every 6 (six) hours as needed (Nausea or vomiting).  30 tablet  0  . nitroGLYCERIN (NITROSTAT) 0.4 MG SL tablet Place 0.4 mg under the tongue every 5 (five) minutes as needed. Chest pain      . omeprazole (PRILOSEC) 20 MG capsule Take 1 capsule (20 mg total) by mouth 2 (two) times daily.  60 capsule  12  . ondansetron (ZOFRAN) 8 MG tablet Take 1 tablet (8 mg total) by mouth 2 (two) times daily. Take two times a day starting the day after chemo for  2 days. Then take two times a day as needed for nausea or vomiting.  30 tablet  1  . oxybutynin (DITROPAN-XL) 10 MG 24 hr tablet Take 10 mg by mouth daily.      Bertram Gala Glycol-Propyl Glycol 0.4-0.3 % SOLN Place 1 drop into both eyes every morning.       . prochlorperazine (COMPAZINE) 10 MG tablet Take 1 tablet (10 mg total) by mouth every 6 (six) hours as needed (Nausea or vomiting).  30 tablet  1  . prochlorperazine (COMPAZINE) 25 MG suppository Place 1 suppository (25 mg total) rectally every 12 (twelve) hours as needed for nausea.  12 suppository  3  . SF 5000 PLUS 1.1 % CREA dental cream Place 1.1 % onto teeth at bedtime.       [name] trimethoprim (TRIMPEX) 100 MG tablet Take 100 mg by mouth every morning.       . venlafaxine XR (EFFEXOR-XR) 75 MG 24 hr capsule Take 75 mg  Orally daily  30 capsule  8   Current Facility-Administered Medications on File Prior to Visit  Medication Dose Route Frequency Provider Last Rate Last Dose  . influenza  inactive virus vaccine (FLUZONE/FLUARIX) injection 0.5 mL  0.5 mL Intramuscular Once Joselyn Arrow, MD        Allergies  Allergen Reactions  . Codeine Nausea And Vomiting       . Isosorbide Other (See Comments)    Extreme headaches  . Other Swelling    All Lip moisturizers except Vaseline.  . Phenothiazines Other (See Comments)    Makes her stop breathing.  . Talwin (Pentazocine) Nausea And Vomiting  . Yellow Jacket Venom Anaphylaxis  . Ambien (Zolpidem Tartrate)     Bad dreams    Past Medical History  Diagnosis Date  . Interstitial cystitis     on chronic antibiotics  . Hyperlipidemia   . Frequent UTI     on prophylaxis  . 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 CAD (coronary artery disease)     a. s/p CABG 7/13;   b.  LHC 12/01/11:  pLAD 70%, mLAD 40%, CFX 40-50% prior to takeoff of the OM2, oRCA occluded, mid vessel filled via R->R collaterals and distal vessel filled by L->R collaterals, S-OM1/OM2 (small and diffusely dz) with mid 90% stenosis, 90%  at OM1 anastomotic site, continuation of OM2 occluded, S-Dx patent, S-PDA occluded, L-LAD ok, EF 55-65%  => Med Rx rec.  [name] Hx of echocardiogram     Echo 5/13: EF 60-65%, grade 2 diastolic dysfunction  . Hypercholesterolemia   . Pleural effusion 12/28/11  s/p pleurx catheter  . GERD (gastroesophageal reflux disease)   . Arthritis     "just a little; lower back" (12/28/11)  . Gout attack 08/2011    related to "stress post OHS"  . Depression   . Breast cancer     "left"; on Tamoxifen  . S/P thoracentesis 12/28/11    "for pleural effusion" (12/28/2011)  . Ovarian cancer   . Tachycardia   . Neuromuscular disorder     peripheral neuropathy    Past Surgical History  Procedure Laterality Date  . Breast lumpectomy  04/2009    left  . Cesarean section  1973; 1976  . Muscle release  1960    L neck and chest.; "when I was 13; pneumonia settled in my left neck"  . Coronary artery bypass graft  08/18/2011    Procedure: CORONARY ARTERY BYPASS GRAFTING (CABG);  Surgeon: Alleen Borne, MD;  Location: [facility] OR;  Service: Open Heart Surgery;  Laterality: N/A;  Coronary Artery Bypass Graft times five utilizing the left internal mammary artery and the left greater saphenous vein harvested endoscopically.  . Abdominal hysterectomy  1976  . Appendectomy  1976  . Portacath placement  01/06/2012    Procedure: INSERTION PORT-A-CATH;  Surgeon: Alleen Borne, MD;  Location: [facility] OR;  Service: Thoracic;  Laterality: Left;  . Chest tube insertion  01/06/2012    Procedure: INSERTION PLEURAL DRAINAGE CATHETER;  Surgeon: Alleen Borne, MD;  Location: [facility] OR;  Service: Thoracic;  Laterality: Bilateral;  . Removal of pleural drainage catheter Right 04/12/2012    Procedure: MINOR REMOVAL OF PLEURAL DRAINAGE CATHETER;  Surgeon: Alleen Borne, MD;  Location: [facility] OR;  Service: Thoracic;  Laterality: Right;  . Talc pleurodesis Left 04/12/2012    Procedure: Lurlean Nanny;  Surgeon: Alleen Borne, MD;  Location[facility] OR;   Service: Thoracic;  Laterality: Left;  . Portacath placement Left 04/17/2012    Procedure: INSERTION PORT-A-CATH;  Surgeon: Alleen Borne, MD;  Location: [facility] OR;  Service: Thoracic;  Laterality: Left;  . Removal of pleural drainage catheter Left 04/17/2012    Procedure: REMOVAL OF PLEURAL DRAINAGE CATHETER;  Surgeon: Alleen Borne, MD;  Location: [facility] OR;  Service: Thoracic;  Laterality: Left;  . Port-a-cath removal Left 04/17/2012    Procedure: REMOVAL PORT-A-CATH;  Surgeon: Alleen Borne, MD;  Location: [facility] OR;  Service: Thoracic;  Laterality: Left;  . Cardiac catheterization    . Laparotomy Bilateral 05/30/2012    Procedure: Resection of umbilical mass, Partial omentectomy;  Surgeon: Jeannette Corpus, MD;  Location: [facility] ORS;  Service: Gynecology;  Laterality: Bilateral;    History  Smoking status  . Former Smoker -- 0.12 packs/day for 15 years  . Types: Cigarettes  . Quit date: 02/15/2001  Smokeless tobacco  . Never Used    History  Alcohol Use  . 4.2 oz/week  . 7 Shots of liquor per week    Comment: 12/28/11 "1 gin & tonic q hs", 2/5/141-2 glass a wine occa  05/26/12 none x 1 year    Family History  Problem Relation Age of Onset  . Cancer Mother 66    breast cancer  . Heart disease Father 48    MI at 66, CABG in 67's  . Hepatitis Father     C from blood transfusion  . Heart disease Brother     CABG in 56's  . Heart disease Paternal Aunt   . Heart disease Paternal Uncle   . Heart  disease Paternal Grandfather   . Diabetes Neg Hx     Reviw of Systems:  Reviewed in the HPI.  All other systems are negative.  Physical Exam: Blood pressure 120/68, pulse 84, height 5\' 6"  (1.676 m), weight 151 lb 3.2 oz (68.584 kg). General: chronically ill appearing female, edematous, she is looking much better / stronger today.  She has re-grown some of her hair.   Head: Normocephalic, atraumatic, sclera non-icteric, mucus membranes are moist,   Neck: Supple. Carotids are 2 + without  bruits. No JVD  Lungs: decreased breath sounds in the bases.  Right pleurex tube site has stitches.  The left pleurex site is bandaged.    Heart: regular rate.  normal  S1 S2. No murmurs, gallops or rubs.  Abdomen: mild distension  Msk:    Extremities: No clubbing or cyanosis. 1+ edema.  Distal pedal pulses are 2+ and equal bilaterally.  Neuro: Alert and oriented X 3. Moves all extremities spontaneously.  Psych:  Responds to questions appropriately with a normal affect.  ECG: April 18, 2012:  Ectopic atrial ryhtym.  No ST or T wave changes  Assessment / Plan:

## 2012-07-24 ENCOUNTER — Ambulatory Visit (HOSPITAL_BASED_OUTPATIENT_CLINIC_OR_DEPARTMENT_OTHER): Payer: BC Managed Care – PPO

## 2012-07-24 ENCOUNTER — Ambulatory Visit (HOSPITAL_BASED_OUTPATIENT_CLINIC_OR_DEPARTMENT_OTHER): Payer: BC Managed Care – PPO | Admitting: Oncology

## 2012-07-24 ENCOUNTER — Other Ambulatory Visit (HOSPITAL_BASED_OUTPATIENT_CLINIC_OR_DEPARTMENT_OTHER): Payer: BC Managed Care – PPO | Admitting: Lab

## 2012-07-24 ENCOUNTER — Telehealth: Payer: Self-pay | Admitting: *Deleted

## 2012-07-24 VITALS — BP 127/73 | HR 80 | Temp 98.2°F | Resp 20 | Ht 66.0 in | Wt 151.8 lb

## 2012-07-24 DIAGNOSIS — F341 Dysthymic disorder: Secondary | ICD-10-CM

## 2012-07-24 DIAGNOSIS — Z5111 Encounter for antineoplastic chemotherapy: Secondary | ICD-10-CM

## 2012-07-24 DIAGNOSIS — C569 Malignant neoplasm of unspecified ovary: Secondary | ICD-10-CM

## 2012-07-24 DIAGNOSIS — D0590 Unspecified type of carcinoma in situ of unspecified breast: Secondary | ICD-10-CM

## 2012-07-24 DIAGNOSIS — R188 Other ascites: Secondary | ICD-10-CM

## 2012-07-24 DIAGNOSIS — C786 Secondary malignant neoplasm of retroperitoneum and peritoneum: Secondary | ICD-10-CM

## 2012-07-24 DIAGNOSIS — Z170 Estrogen receptor positive status [ER+]: Secondary | ICD-10-CM

## 2012-07-24 DIAGNOSIS — C50919 Malignant neoplasm of unspecified site of unspecified female breast: Secondary | ICD-10-CM

## 2012-07-24 DIAGNOSIS — Z5112 Encounter for antineoplastic immunotherapy: Secondary | ICD-10-CM

## 2012-07-24 LAB — CBC WITH DIFFERENTIAL/PLATELET
Basophils Absolute: 0 10*3/uL (ref 0.0–0.1)
Eosinophils Absolute: 0.4 10*3/uL (ref 0.0–0.5)
HGB: 11.6 g/dL (ref 11.6–15.9)
MCV: 91.9 fL (ref 79.5–101.0)
MONO#: 0.5 10*3/uL (ref 0.1–0.9)
MONO%: 6.3 % (ref 0.0–14.0)
NEUT#: 4.8 10*3/uL (ref 1.5–6.5)
RDW: 17.3 % — ABNORMAL HIGH (ref 11.2–14.5)
WBC: 7.5 10*3/uL (ref 3.9–10.3)
lymph#: 1.8 10*3/uL (ref 0.9–3.3)

## 2012-07-24 LAB — COMPREHENSIVE METABOLIC PANEL (CC13)
ALT: 21 U/L (ref 0–55)
AST: 22 U/L (ref 5–34)
Chloride: 107 mEq/L (ref 98–107)
Creatinine: 0.7 mg/dL (ref 0.6–1.1)
Total Bilirubin: 0.55 mg/dL (ref 0.20–1.20)

## 2012-07-24 LAB — UA PROTEIN, DIPSTICK - CHCC: Protein, ur: NEGATIVE mg/dL

## 2012-07-24 MED ORDER — ONDANSETRON 8 MG/50ML IVPB (CHCC)
8.0000 mg | Freq: Once | INTRAVENOUS | Status: AC
  Administered 2012-07-24: 8 mg via INTRAVENOUS

## 2012-07-24 MED ORDER — SODIUM CHLORIDE 0.9 % IV SOLN
15.0000 mg/kg | Freq: Once | INTRAVENOUS | Status: AC
  Administered 2012-07-24: 1025 mg via INTRAVENOUS
  Filled 2012-07-24: qty 41

## 2012-07-24 MED ORDER — DEXAMETHASONE SODIUM PHOSPHATE 10 MG/ML IJ SOLN
10.0000 mg | Freq: Once | INTRAMUSCULAR | Status: AC
  Administered 2012-07-24: 10 mg via INTRAVENOUS

## 2012-07-24 MED ORDER — SODIUM CHLORIDE 0.9 % IJ SOLN
10.0000 mL | INTRAMUSCULAR | Status: DC | PRN
  Administered 2012-07-24: 10 mL
  Filled 2012-07-24: qty 10

## 2012-07-24 MED ORDER — TOPOTECAN HCL CHEMO INJECTION 4 MG
3.9500 mg/m2 | Freq: Once | INTRAVENOUS | Status: AC
  Administered 2012-07-24: 7 mg via INTRAVENOUS
  Filled 2012-07-24: qty 7

## 2012-07-24 MED ORDER — SODIUM CHLORIDE 0.9 % IV SOLN
Freq: Once | INTRAVENOUS | Status: AC
  Administered 2012-07-24: 14:00:00 via INTRAVENOUS

## 2012-07-24 MED ORDER — HEPARIN SOD (PORK) LOCK FLUSH 100 UNIT/ML IV SOLN
500.0000 [IU] | Freq: Once | INTRAVENOUS | Status: AC | PRN
  Administered 2012-07-24: 500 [IU]
  Filled 2012-07-24: qty 5

## 2012-07-24 NOTE — Patient Instructions (Addendum)
Proceed with topotecan and avastin today  Return in 1 week for labs/MD visit/topotecan

## 2012-07-24 NOTE — Patient Instructions (Addendum)
 Cancer Center Discharge Instructions for Patients Receiving Chemotherapy  Today you received the following chemotherapy agents avastin, topotecan  To help prevent nausea and vomiting after your treatment, we encourage you to take your nausea medication as needed. To stay ahead of nausea on new regimen take compazine tonight about 8 pm and use as needed. You can also use the ativan if needed.   If you develop nausea and vomiting that is not controlled by your nausea medication, call the clinic.   BELOW ARE SYMPTOMS THAT SHOULD BE REPORTED IMMEDIATELY:  *FEVER GREATER THAN 100.5 F  *CHILLS WITH OR WITHOUT FEVER  NAUSEA AND VOMITING THAT IS NOT CONTROLLED WITH YOUR NAUSEA MEDICATION  *UNUSUAL SHORTNESS OF BREATH  *UNUSUAL BRUISING OR BLEEDING  TENDERNESS IN MOUTH AND THROAT WITH OR WITHOUT PRESENCE OF ULCERS  *URINARY PROBLEMS  *BOWEL PROBLEMS  UNUSUAL RASH Items with * indicate a potential emergency and should be followed up as soon as possible.  Feel free to call the clinic you have any questions or concerns. The clinic phone number is [phone].

## 2012-07-24 NOTE — Telephone Encounter (Signed)
Per staff message and POF I have scheduled appts.  JMW  

## 2012-07-24 NOTE — Progress Notes (Signed)
OFFICE PROGRESS NOTE  CC KNAPP,[name], MD [address] [place] [address] Dr. Emelia Loron Dr. 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  PRIOR THERAPY: #1. S/P left breast lumpectomy in March 2011 after she had a screen detected 1.0 cm ER+, PR+, intermediate grade DCIS. Patient had 3 sentinel biopsied all of them were negative for metastatic disease. Patient underwent radiation therapy between 06/05/2009 through 07/02/2009. She was then begun on tamoxifen 20 mg daily in August 2011.  #2  diagnosis of gynecologic malignancy presenting with abdominal mass and pleural effusions. Patient was recently hospitalized with shortness of breath she was discovered to have malignant pleural effusion she is status post Pleurx catheter is. She also had malignant ascites she's had several paracentesis procedures performed during her hospitalization. During her hospitalization she was seen by gynecologic oncology.  #3 patient began neoadjuvant chemotherapy consisting of Taxol and carboplatinum. Her first cycle was administered during her hospitalization. She has completed aTotal of 6 cycles of Taxol and carboplatinum from 01/07/2012 - 06/23/2012. Overall she tolerated it well except for some fatigue and neuropathies.  #4 patient is  status post laparotomy that revealed significant residual disease. I discussed her for pathology from 05/30/2012  #5 patient is tissue was sent for chemotherapy sensitivity testing. We have yet to receive the results. However based on that we will begin salvage chemotherapy.  CURRENT THERAPY: Here for cycle 1 day 1 of Avastin and topotecan.  INTERVAL HISTORY: [name] [name] 67 y.o. female returns for an interim visit. She seems to be looking much better. She denies any fevers chills night sweats headaches chest pains palpitations no myalgias and arthralgias she does have some shortness of  breath especially on exertion but does not experience any chest pains or diaphoresis. She has no hematuria hematochezia melena hemoptysis or hematemesis. Remainder of the 10 point review of systems is negative.  MEDICAL HISTORY: Past Medical History  Diagnosis Date  . Interstitial cystitis     on chronic antibiotics  . Hyperlipidemia   . Frequent UTI     on prophylaxis  . 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 CAD (coronary artery disease)     a. s/p CABG 7/13;   b.  LHC 12/01/11:  pLAD 70%, mLAD 40%, CFX 40-50% prior to takeoff of the OM2, oRCA occluded, mid vessel filled via R->R collaterals and distal vessel filled by L->R collaterals, S-OM1/OM2 (small and diffusely dz) with mid 90% stenosis, 90% at OM1 anastomotic site, continuation of OM2 occluded, S-Dx patent, S-PDA occluded, L-LAD ok, EF 55-65%  => Med Rx rec.  [name] Hx of echocardiogram     Echo 5/13: EF 60-65%, grade 2 diastolic dysfunction  . Hypercholesterolemia   . Pleural effusion 12/28/11    s/p pleurx catheter  . GERD (gastroesophageal reflux disease)   . Arthritis     "just a little; lower back" (12/28/11)  . Gout attack 08/2011    related to "stress post OHS"  . Depression   . Breast cancer     "left"; on Tamoxifen  . S/P thoracentesis 12/28/11    "for pleural effusion" (12/28/2011)  . Ovarian cancer   . Tachycardia   . Neuromuscular disorder     peripheral neuropathy    ALLERGIES:  is allergic to codeine; isosorbide; other; phenothiazines; talwin; yellow jacket venom; and ambien.  MEDICATIONS:  Current Outpatient Prescriptions  Medication Sig Dispense Refill  . ALPRAZolam (XANAX) 0.25 MG  tablet Take 1 tablet (0.25 mg total) by mouth every 8 (eight) hours as needed for anxiety.  30 tablet  4  . aspirin 325 MG tablet Take 325 mg by mouth at bedtime.       [name] atorvastatin (LIPITOR) 20 MG tablet Take 20 mg by mouth at bedtime.       . carvedilol (COREG) 25 MG tablet Take 1 tablet (25 mg total) by mouth 2 (two) times daily.   180 tablet  3  . docusate sodium (COLACE) 100 MG capsule Take 200 mg by mouth daily.      . fluticasone (FLONASE) 50 MCG/ACT nasal spray Place 2 sprays into the nose daily as needed for allergies.      [name] gabapentin (NEURONTIN) 100 MG capsule Take 100 mg orally three times a day  90 capsule  6  . omeprazole (PRILOSEC) 20 MG capsule Take 1 capsule (20 mg total) by mouth 2 (two) times daily.  60 capsule  12  . oxybutynin (DITROPAN-XL) 10 MG 24 hr tablet Take 10 mg by mouth daily.      Bertram Gala Glycol-Propyl Glycol 0.4-0.3 % SOLN Place 1 drop into both eyes every morning.       . SF 5000 PLUS 1.1 % CREA dental cream Place 1.1 % onto teeth at bedtime.       [name] trimethoprim (TRIMPEX) 100 MG tablet Take 100 mg by mouth every morning.       . venlafaxine XR (EFFEXOR-XR) 75 MG 24 hr capsule Take 75 mg  Orally daily  30 capsule  8  . Alum & Mag Hydroxide-Simeth (MAGIC MOUTHWASH) SOLN Take 5 mLs by mouth 4 (four) times daily as needed (mouth sores).      [name] dexamethasone (DECADRON) 4 MG tablet Take 2 tablets (8 mg total) by mouth 2 (two) times daily with a meal. Take daily starting the day after chemotherapy for 2 days. Take with food.  30 tablet  1  . EPINEPHrine (EPIPEN 2-PAK) 0.3 mg/0.3 mL DEVI Inject 0.3 mg into the muscle daily as needed (allergic reaction).       [name] HYDROcodone-acetaminophen (NORCO/VICODIN) 5-325 MG per tablet Take 1-2 tablets by mouth every 4 (four) hours as needed.  40 tablet  2  . LORazepam (ATIVAN) 0.5 MG tablet Take 1 tablet (0.5 mg total) by mouth every 6 (six) hours as needed (Nausea or vomiting).  30 tablet  0  . nitroGLYCERIN (NITROSTAT) 0.4 MG SL tablet Place 0.4 mg under the tongue every 5 (five) minutes as needed. Chest pain      . ondansetron (ZOFRAN) 8 MG tablet Take 1 tablet (8 mg total) by mouth 2 (two) times daily. Take two times a day starting the day after chemo for 2 days. Then take two times a day as needed for nausea or vomiting.  30 tablet  1  . prochlorperazine  (COMPAZINE) 10 MG tablet Take 1 tablet (10 mg total) by mouth every 6 (six) hours as needed (Nausea or vomiting).  30 tablet  1  . prochlorperazine (COMPAZINE) 25 MG suppository Place 1 suppository (25 mg total) rectally every 12 (twelve) hours as needed for nausea.  12 suppository  3   No current facility-administered medications for this visit.   Facility-Administered Medications Ordered in Other Visits  Medication Dose Route Frequency Provider Last Rate Last Dose  . influenza  inactive virus vaccine (FLUZONE/FLUARIX) injection 0.5 mL  0.5 mL Intramuscular Once Joselyn Arrow, MD  SURGICAL HISTORY:  Past Surgical History  Procedure Laterality Date  . Breast lumpectomy  04/2009    left  . Cesarean section  1973; 1976  . Muscle release  1960    L neck and chest.; "when I was 13; pneumonia settled in my left neck"  . Coronary artery bypass graft  08/18/2011    Procedure: CORONARY ARTERY BYPASS GRAFTING (CABG);  Surgeon: Alleen Borne, MD;  Location: [facility] OR;  Service: Open Heart Surgery;  Laterality: N/A;  Coronary Artery Bypass Graft times five utilizing the left internal mammary artery and the left greater saphenous vein harvested endoscopically.  . Abdominal hysterectomy  1976  . Appendectomy  1976  . Portacath placement  01/06/2012    Procedure: INSERTION PORT-A-CATH;  Surgeon: Alleen Borne, MD;  Location: [facility] OR;  Service: Thoracic;  Laterality: Left;  . Chest tube insertion  01/06/2012    Procedure: INSERTION PLEURAL DRAINAGE CATHETER;  Surgeon: Alleen Borne, MD;  Location: [facility] OR;  Service: Thoracic;  Laterality: Bilateral;  . Removal of pleural drainage catheter Right 04/12/2012    Procedure: MINOR REMOVAL OF PLEURAL DRAINAGE CATHETER;  Surgeon: Alleen Borne, MD;  Location: [facility] OR;  Service: Thoracic;  Laterality: Right;  . Talc pleurodesis Left 04/12/2012    Procedure: Lurlean Nanny;  Surgeon: Alleen Borne, MD;  Location: [facility] OR;  Service: Thoracic;  Laterality: Left;  .  Portacath placement Left 04/17/2012    Procedure: INSERTION PORT-A-CATH;  Surgeon: Alleen Borne, MD;  Location: [facility] OR;  Service: Thoracic;  Laterality: Left;  . Removal of pleural drainage catheter Left 04/17/2012    Procedure: REMOVAL OF PLEURAL DRAINAGE CATHETER;  Surgeon: Alleen Borne, MD;  Location: [facility] OR;  Service: Thoracic;  Laterality: Left;  . Port-a-cath removal Left 04/17/2012    Procedure: REMOVAL PORT-A-CATH;  Surgeon: Alleen Borne, MD;  Location: [facility] OR;  Service: Thoracic;  Laterality: Left;  . Cardiac catheterization    . Laparotomy Bilateral 05/30/2012    Procedure: Resection of umbilical mass, Partial omentectomy;  Surgeon: Jeannette Corpus, MD;  Location: [facility] ORS;  Service: Gynecology;  Laterality: Bilateral;    REVIEW OF SYSTEMS:   General: fatigue (+), night sweats (-), fever (-), pain (-) Lymph: palpable nodes (-) HEENT: vision changes (-), mucositis (-), gum bleeding (-), epistaxis (-) Cardiovascular: chest pain (-), palpitations (-) Pulmonary: shortness of breath (-), dyspnea on exertion (-), cough (-), hemoptysis (-) GI:  Early satiety (-), melena (-), dysphagia (-), nausea/vomiting (-), diarrhea (-) GU: dysuria (-), hematuria (-), incontinence (-) Musculoskeletal: joint swelling (-), joint pain (-), back pain (-) Neuro: weakness (-), numbness (+), headache (-), confusion (-) Skin: Rash (-), lesions (-), dryness (-) Psych: depression (-), suicidal/homicidal ideation (-), feeling of hopelessness (-)   PHYSICAL EXAMINATION:  BP 127/73  Pulse 80  Temp(Src) 98.2 F (36.8 C) (Oral)  Resp 20  Ht 5\' 6"  (1.676 m)  Wt 151 lb 12.8 oz (68.856 kg)  BMI 24.51 kg/m2  BP lying 112/74, HR 94, BP sitting 116/75, HR 95, BP standing 115/73, HR 105 General: Patient is an ill appearing female in no acute distress HEENT: PERRLA, sclerae anicteric no conjunctival pallor, MMM, fluid in tympanic membranes Neck: supple, no palpable adenopathy Lungs: clear to auscultation  bilaterally, no wheezes, rhonchi, or rales Cardiovascular: regular rate rhythm, S1, S2, no murmurs, rubs or gallops Abdomen: Soft, non-tender, non-distended, normoactive bowel sounds, no HSM, Extremities: warm and well perfused, no clubbing, cyanosis, or edema Skin: No  rashes or lesions Neuro: Non-focal ECOG PERFORMANCE STATUS: 0 - Asymptomatic  LABORATORY DATA: Lab Results  Component Value Date   WBC 7.5 07/24/2012   HGB 11.6 07/24/2012   HCT 37.6 07/24/2012   MCV 91.9 07/24/2012   PLT 374 07/24/2012      Chemistry      Component Value Date/Time   NA 140 06/23/2012 1311   NA 134* 06/02/2012 0515   NA 141 12/17/2011 1144   K 3.8 06/23/2012 1311   K 4.4 06/02/2012 0515   CL 105 06/23/2012 1311   CL 100 06/02/2012 0515   CO2 24 06/23/2012 1311   CO2 25 06/02/2012 0515   BUN 12.4 06/23/2012 1311   BUN 20 06/02/2012 0515   BUN 15 12/17/2011 1144   CREATININE 0.8 06/23/2012 1311   CREATININE 0.76 06/02/2012 0515      Component Value Date/Time   CALCIUM 9.5 06/23/2012 1311   CALCIUM 8.7 06/02/2012 0515   ALKPHOS 101 06/23/2012 1311   ALKPHOS 85 05/26/2012 0955   AST 20 06/23/2012 1311   AST 22 05/26/2012 0955   ALT 14 06/23/2012 1311   ALT 16 05/26/2012 0955   BILITOT 0.41 06/23/2012 1311   BILITOT 0.3 05/26/2012 0955       RADIOGRAPHIC STUDIES:  No results found.  ASSESSMENT: 67 year old female with:  #1 DCIS of the breast. She is on tamoxifen 20 mg daily. However I have recommended that we discontinue the tamoxifen for now a do to her recent diagnosis of ovarian cancer.  #2 recent diagnosis of gynecologic malignancy this is ovarian carcinoma). Patient is currently receiving neoadjuvant chemotherapy consisting of Taxol and carboplatinum. Overall she is tolerating it well. Recent CT performed on January 8 showed a minute response to chemotherapy. Total of 6 cycles of Taxol and carboplatinum was given.  #3 patient is now status post laparotomy performed on 05/30/2012. Unfortunately intraoperatively she was  found to have significant residual disease. 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 Patient and I discussed the pathology results today. We also discussed further treatment options. This would include systemic chemotherapy. The regimen gemcitabine and carboplatinum with Avastin is a possibility. However I would like to have her recover from her surgery the next few weeks. She is also scheduled to be seen by Dr. Grant Ruts.   #4 patient has had chemotherapy sensitivity testing performed unfortunately the results were inconclusive. We discussed the possibility of doing to topotecan and Avastin. This is discussed with the patient. Dr. Gerre Pebbles has also recommended this. We discussed risks and benefits of this. She is here for cycle 1 day 1 of topo/avastin.  #5 depression/anxiety: Currently on Effexor with good response  PLAN: #1 proceed with scheduled chemotherapy today with Avastin.  #2 she'll be seen back in one week's time for day 8 of topotecan  #3 she knows to call you with any problems in the interim  All questions were answered. The patient knows to call the clinic with any problems, questions or concerns. We can certainly see the patient much sooner if necessary.  I spent 25 minutes counseling the patient face to face. The total time spent in the appointment was 30 minutes.  Drue Second, MD Medical/Oncology [facility] [phone] (beeper) [phone] (Office)  07/24/2012, 1:03 PM  .

## 2012-07-25 ENCOUNTER — Other Ambulatory Visit: Payer: Self-pay | Admitting: Emergency Medicine

## 2012-07-25 ENCOUNTER — Telehealth: Payer: Self-pay | Admitting: *Deleted

## 2012-07-25 MED ORDER — LIDOCAINE-PRILOCAINE 2.5-2.5 % EX CREA: TOPICAL_CREAM | CUTANEOUS | Status: AC | PRN

## 2012-07-25 NOTE — Telephone Encounter (Signed)
No adverse effect from chemo. Asking if OK to eat fresh fruit and vegetables from her garden? Told her yes, be sure to wash fruits and vegetables and be sure they are from good/safe source.

## 2012-07-28 ENCOUNTER — Other Ambulatory Visit: Payer: Self-pay | Admitting: Emergency Medicine

## 2012-07-28 ENCOUNTER — Telehealth: Payer: Self-pay | Admitting: Oncology

## 2012-07-28 NOTE — Telephone Encounter (Signed)
, °

## 2012-07-31 ENCOUNTER — Ambulatory Visit: Payer: BC Managed Care – PPO | Admitting: Oncology

## 2012-07-31 ENCOUNTER — Other Ambulatory Visit: Payer: BC Managed Care – PPO

## 2012-07-31 ENCOUNTER — Ambulatory Visit (HOSPITAL_BASED_OUTPATIENT_CLINIC_OR_DEPARTMENT_OTHER): Payer: BC Managed Care – PPO | Admitting: Oncology

## 2012-07-31 ENCOUNTER — Other Ambulatory Visit: Payer: BC Managed Care – PPO | Admitting: Lab

## 2012-07-31 ENCOUNTER — Encounter: Payer: Self-pay | Admitting: Oncology

## 2012-07-31 ENCOUNTER — Other Ambulatory Visit (HOSPITAL_BASED_OUTPATIENT_CLINIC_OR_DEPARTMENT_OTHER): Payer: BC Managed Care – PPO | Admitting: Lab

## 2012-07-31 ENCOUNTER — Telehealth: Payer: Self-pay | Admitting: *Deleted

## 2012-07-31 ENCOUNTER — Ambulatory Visit (HOSPITAL_BASED_OUTPATIENT_CLINIC_OR_DEPARTMENT_OTHER): Payer: BC Managed Care – PPO

## 2012-07-31 VITALS — BP 135/86 | HR 76 | Temp 97.6°F | Resp 20 | Ht 66.0 in | Wt 147.8 lb

## 2012-07-31 DIAGNOSIS — C569 Malignant neoplasm of unspecified ovary: Secondary | ICD-10-CM

## 2012-07-31 DIAGNOSIS — D0590 Unspecified type of carcinoma in situ of unspecified breast: Secondary | ICD-10-CM

## 2012-07-31 DIAGNOSIS — Z170 Estrogen receptor positive status [ER+]: Secondary | ICD-10-CM

## 2012-07-31 DIAGNOSIS — Z5111 Encounter for antineoplastic chemotherapy: Secondary | ICD-10-CM

## 2012-07-31 DIAGNOSIS — C50919 Malignant neoplasm of unspecified site of unspecified female breast: Secondary | ICD-10-CM

## 2012-07-31 DIAGNOSIS — F411 Generalized anxiety disorder: Secondary | ICD-10-CM

## 2012-07-31 LAB — CBC WITH DIFFERENTIAL/PLATELET
BASO%: 0.2 % (ref 0.0–2.0)
EOS%: 7.7 % — ABNORMAL HIGH (ref 0.0–7.0)
LYMPH%: 18.4 % (ref 14.0–49.7)
MCHC: 31.8 g/dL (ref 31.5–36.0)
MCV: 89.6 fL (ref 79.5–101.0)
MONO%: 4.1 % (ref 0.0–14.0)
Platelets: 247 10*3/uL (ref 145–400)
RBC: 4.25 10*6/uL (ref 3.70–5.45)

## 2012-07-31 LAB — UA PROTEIN, DIPSTICK - CHCC: Protein, ur: NEGATIVE mg/dL

## 2012-07-31 LAB — COMPREHENSIVE METABOLIC PANEL (CC13)
ALT: 22 U/L (ref 0–55)
AST: 16 U/L (ref 5–34)
Creatinine: 0.7 mg/dL (ref 0.6–1.1)
Total Bilirubin: 0.46 mg/dL (ref 0.20–1.20)

## 2012-07-31 MED ORDER — TOPOTECAN HCL CHEMO INJECTION 4 MG
4.0000 mg/m2 | Freq: Once | INTRAVENOUS | Status: AC
  Administered 2012-07-31: 7 mg via INTRAVENOUS
  Filled 2012-07-31: qty 7

## 2012-07-31 MED ORDER — SODIUM CHLORIDE 0.9 % IJ SOLN
10.0000 mL | INTRAMUSCULAR | Status: DC | PRN
  Administered 2012-07-31: 10 mL
  Filled 2012-07-31: qty 10

## 2012-07-31 MED ORDER — ONDANSETRON 8 MG/50ML IVPB (CHCC)
8.0000 mg | Freq: Once | INTRAVENOUS | Status: AC
  Administered 2012-07-31: 8 mg via INTRAVENOUS

## 2012-07-31 MED ORDER — SODIUM CHLORIDE 0.9 % IV SOLN
Freq: Once | INTRAVENOUS | Status: AC
  Administered 2012-07-31: 15:00:00 via INTRAVENOUS

## 2012-07-31 MED ORDER — DEXAMETHASONE SODIUM PHOSPHATE 10 MG/ML IJ SOLN
10.0000 mg | Freq: Once | INTRAMUSCULAR | Status: AC
  Administered 2012-07-31: 10 mg via INTRAVENOUS

## 2012-07-31 MED ORDER — HEPARIN SOD (PORK) LOCK FLUSH 100 UNIT/ML IV SOLN
500.0000 [IU] | Freq: Once | INTRAVENOUS | Status: AC | PRN
  Administered 2012-07-31: 500 [IU]
  Filled 2012-07-31: qty 5

## 2012-07-31 NOTE — Patient Instructions (Addendum)
[facility] Discharge Instructions for Patients Receiving Chemotherapy  Today you received the following chemotherapy agents Topotecan.  To help prevent nausea and vomiting after your treatment, we encourage you to take your nausea medication as prescribed.   If you develop nausea and vomiting that is not controlled by your nausea medication, call the clinic.   BELOW ARE SYMPTOMS THAT SHOULD BE REPORTED IMMEDIATELY:  *FEVER GREATER THAN 100.5 F  *CHILLS WITH OR WITHOUT FEVER  NAUSEA AND VOMITING THAT IS NOT CONTROLLED WITH YOUR NAUSEA MEDICATION  *UNUSUAL SHORTNESS OF BREATH  *UNUSUAL BRUISING OR BLEEDING  TENDERNESS IN MOUTH AND THROAT WITH OR WITHOUT PRESENCE OF ULCERS  *URINARY PROBLEMS  *BOWEL PROBLEMS  UNUSUAL RASH Items with * indicate a potential emergency and should be followed up as soon as possible.  Feel free to call the clinic you have any questions or concerns. The clinic phone number is [phone].    

## 2012-07-31 NOTE — Progress Notes (Signed)
OFFICE PROGRESS NOTE  CC KNAPP,[name], MD [address] [place] [address] Dr. Emelia Loron Dr. 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  PRIOR THERAPY: #1. S/P left breast lumpectomy in March 2011 after she had a screen detected 1.0 cm ER+, PR+, intermediate grade DCIS. Patient had 3 sentinel biopsied all of them were negative for metastatic disease. Patient underwent radiation therapy between 06/05/2009 through 07/02/2009. She was then begun on tamoxifen 20 mg daily in August 2011.  #2  diagnosis of gynecologic malignancy presenting with abdominal mass and pleural effusions. Patient was recently hospitalized with shortness of breath she was discovered to have malignant pleural effusion she is status post Pleurx catheter is. She also had malignant ascites she's had several paracentesis procedures performed during her hospitalization. During her hospitalization she was seen by gynecologic oncology.  #3 patient began neoadjuvant chemotherapy consisting of Taxol and carboplatinum. Her first cycle was administered during her hospitalization. She has completed aTotal of 6 cycles of Taxol and carboplatinum from 01/07/2012 - 06/23/2012. Overall she tolerated it well except for some fatigue and neuropathies.  #4 patient is  status post laparotomy that revealed significant residual disease. I discussed her for pathology from 05/30/2012  #5 patient is tissue was sent for chemotherapy sensitivity testing. We have yet to receive the results. However based on that we will begin salvage chemotherapy.  CURRENT THERAPY: Here for cycle 8 topotecan.  INTERVAL HISTORY: [name] [name] 67 y.o. female returns for an interim visit. She seems to be looking much better. She denies any fevers chills night sweats headaches chest pains palpitations no myalgias and arthralgias she does have some shortness of breath especially on  exertion but does not experience any chest pains or diaphoresis. She has no hematuria hematochezia melena hemoptysis or hematemesis. Remainder of the 10 point review of systems is negative.  MEDICAL HISTORY: Past Medical History  Diagnosis Date  . Interstitial cystitis     on chronic antibiotics  . Hyperlipidemia   . Frequent UTI     on prophylaxis  . 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 CAD (coronary artery disease)     a. s/p CABG 7/13;   b.  LHC 12/01/11:  pLAD 70%, mLAD 40%, CFX 40-50% prior to takeoff of the OM2, oRCA occluded, mid vessel filled via R->R collaterals and distal vessel filled by L->R collaterals, S-OM1/OM2 (small and diffusely dz) with mid 90% stenosis, 90% at OM1 anastomotic site, continuation of OM2 occluded, S-Dx patent, S-PDA occluded, L-LAD ok, EF 55-65%  => Med Rx rec.  [name] Hx of echocardiogram     Echo 5/13: EF 60-65%, grade 2 diastolic dysfunction  . Hypercholesterolemia   . Pleural effusion 12/28/11    s/p pleurx catheter  . GERD (gastroesophageal reflux disease)   . Arthritis     "just a little; lower back" (12/28/11)  . Gout attack 08/2011    related to "stress post OHS"  . Depression   . Breast cancer     "left"; on Tamoxifen  . S/P thoracentesis 12/28/11    "for pleural effusion" (12/28/2011)  . Ovarian cancer   . Tachycardia   . Neuromuscular disorder     peripheral neuropathy    ALLERGIES:  is allergic to codeine; isosorbide; other; phenothiazines; talwin; yellow jacket venom; and ambien.  MEDICATIONS:  Current Outpatient Prescriptions  Medication Sig Dispense Refill  . ALPRAZolam (XANAX) 0.25 MG tablet Take 1 tablet (0.25  mg total) by mouth every 8 (eight) hours as needed for anxiety.  30 tablet  4  . aspirin 325 MG tablet Take 325 mg by mouth at bedtime.       [name] atorvastatin (LIPITOR) 20 MG tablet Take 20 mg by mouth at bedtime.       . carvedilol (COREG) 25 MG tablet Take 1 tablet (25 mg total) by mouth 2 (two) times daily.  180 tablet  3  .  docusate sodium (COLACE) 100 MG capsule Take 200 mg by mouth daily.      . fluticasone (FLONASE) 50 MCG/ACT nasal spray Place 2 sprays into the nose daily as needed for allergies.      [name] gabapentin (NEURONTIN) 100 MG capsule Take 100 mg orally three times a day  90 capsule  6  . lidocaine-prilocaine (EMLA) cream Apply topically as needed.  30 g  1  . LORazepam (ATIVAN) 0.5 MG tablet Take 1 tablet (0.5 mg total) by mouth every 6 (six) hours as needed (Nausea or vomiting).  30 tablet  0  . omeprazole (PRILOSEC) 20 MG capsule Take 1 capsule (20 mg total) by mouth 2 (two) times daily.  60 capsule  12  . ondansetron (ZOFRAN) 8 MG tablet Take 1 tablet (8 mg total) by mouth 2 (two) times daily. Take two times a day starting the day after chemo for 2 days. Then take two times a day as needed for nausea or vomiting.  30 tablet  1  . oxybutynin (DITROPAN-XL) 10 MG 24 hr tablet Take 10 mg by mouth daily.      Bertram Gala Glycol-Propyl Glycol 0.4-0.3 % SOLN Place 1 drop into both eyes every morning.       . SF 5000 PLUS 1.1 % CREA dental cream Place 1.1 % onto teeth at bedtime.       [name] trimethoprim (TRIMPEX) 100 MG tablet Take 100 mg by mouth every morning.       . venlafaxine XR (EFFEXOR-XR) 75 MG 24 hr capsule Take 75 mg  Orally daily  30 capsule  8  . Alum & Mag Hydroxide-Simeth (MAGIC MOUTHWASH) SOLN Take 5 mLs by mouth 4 (four) times daily as needed (mouth sores).      [name] dexamethasone (DECADRON) 4 MG tablet Take 2 tablets (8 mg total) by mouth 2 (two) times daily with a meal. Take daily starting the day after chemotherapy for 2 days. Take with food.  30 tablet  1  . EPINEPHrine (EPIPEN 2-PAK) 0.3 mg/0.3 mL DEVI Inject 0.3 mg into the muscle daily as needed (allergic reaction).       [name] HYDROcodone-acetaminophen (NORCO/VICODIN) 5-325 MG per tablet Take 1-2 tablets by mouth every 4 (four) hours as needed.  40 tablet  2  . nitroGLYCERIN (NITROSTAT) 0.4 MG SL tablet Place 0.4 mg under the tongue every 5 (five)  minutes as needed. Chest pain      . prochlorperazine (COMPAZINE) 10 MG tablet Take 1 tablet (10 mg total) by mouth every 6 (six) hours as needed (Nausea or vomiting).  30 tablet  1  . prochlorperazine (COMPAZINE) 25 MG suppository Place 1 suppository (25 mg total) rectally every 12 (twelve) hours as needed for nausea.  12 suppository  3   No current facility-administered medications for this visit.   Facility-Administered Medications Ordered in Other Visits  Medication Dose Route Frequency Provider Last Rate Last Dose  . influenza  inactive virus vaccine (FLUZONE/FLUARIX) injection 0.5 mL  0.5 mL Intramuscular Once Eve  Lynelle Doctor, MD      . sodium chloride 0.9 % injection 10 mL  10 mL Intracatheter PRN Victorino December, MD   10 mL at 07/31/12 1617    SURGICAL HISTORY:  Past Surgical History  Procedure Laterality Date  . Breast lumpectomy  04/2009    left  . Cesarean section  1973; 1976  . Muscle release  1960    L neck and chest.; "when I was 13; pneumonia settled in my left neck"  . Coronary artery bypass graft  08/18/2011    Procedure: CORONARY ARTERY BYPASS GRAFTING (CABG);  Surgeon: Alleen Borne, MD;  Location: [facility] OR;  Service: Open Heart Surgery;  Laterality: N/A;  Coronary Artery Bypass Graft times five utilizing the left internal mammary artery and the left greater saphenous vein harvested endoscopically.  . Abdominal hysterectomy  1976  . Appendectomy  1976  . Portacath placement  01/06/2012    Procedure: INSERTION PORT-A-CATH;  Surgeon: Alleen Borne, MD;  Location: [facility] OR;  Service: Thoracic;  Laterality: Left;  . Chest tube insertion  01/06/2012    Procedure: INSERTION PLEURAL DRAINAGE CATHETER;  Surgeon: Alleen Borne, MD;  Location: [facility] OR;  Service: Thoracic;  Laterality: Bilateral;  . Removal of pleural drainage catheter Right 04/12/2012    Procedure: MINOR REMOVAL OF PLEURAL DRAINAGE CATHETER;  Surgeon: Alleen Borne, MD;  Location: [facility] OR;  Service: Thoracic;  Laterality: Right;   . Talc pleurodesis Left 04/12/2012    Procedure: Lurlean Nanny;  Surgeon: Alleen Borne, MD;  Location: [facility] OR;  Service: Thoracic;  Laterality: Left;  . Portacath placement Left 04/17/2012    Procedure: INSERTION PORT-A-CATH;  Surgeon: Alleen Borne, MD;  Location: [facility] OR;  Service: Thoracic;  Laterality: Left;  . Removal of pleural drainage catheter Left 04/17/2012    Procedure: REMOVAL OF PLEURAL DRAINAGE CATHETER;  Surgeon: Alleen Borne, MD;  Location: [facility] OR;  Service: Thoracic;  Laterality: Left;  . Port-a-cath removal Left 04/17/2012    Procedure: REMOVAL PORT-A-CATH;  Surgeon: Alleen Borne, MD;  Location: [facility] OR;  Service: Thoracic;  Laterality: Left;  . Cardiac catheterization    . Laparotomy Bilateral 05/30/2012    Procedure: Resection of umbilical mass, Partial omentectomy;  Surgeon: Jeannette Corpus, MD;  Location: [facility] ORS;  Service: Gynecology;  Laterality: Bilateral;    REVIEW OF SYSTEMS:   General: fatigue (+), night sweats (-), fever (-), pain (-) Lymph: palpable nodes (-) HEENT: vision changes (-), mucositis (-), gum bleeding (-), epistaxis (-) Cardiovascular: chest pain (-), palpitations (-) Pulmonary: shortness of breath (-), dyspnea on exertion (-), cough (-), hemoptysis (-) GI:  Early satiety (-), melena (-), dysphagia (-), nausea/vomiting (-), diarrhea (-) GU: dysuria (-), hematuria (-), incontinence (-) Musculoskeletal: joint swelling (-), joint pain (-), back pain (-) Neuro: weakness (-), numbness (+), headache (-), confusion (-) Skin: Rash (-), lesions (-), dryness (-) Psych: depression (-), suicidal/homicidal ideation (-), feeling of hopelessness (-)   PHYSICAL EXAMINATION:  BP 135/86  Pulse 76  Temp(Src) 97.6 F (36.4 C) (Oral)  Resp 20  Ht 5\' 6"  (1.676 m)  Wt 147 lb 12.8 oz (67.042 kg)  BMI 23.87 kg/m2  BP lying 112/74, HR 94, BP sitting 116/75, HR 95, BP standing 115/73, HR 105 General: Patient is an ill appearing female in no acute  distress HEENT: PERRLA, sclerae anicteric no conjunctival pallor, MMM, fluid in tympanic membranes Neck: supple, no palpable adenopathy Lungs: clear to auscultation bilaterally, no wheezes, rhonchi,  or rales Cardiovascular: regular rate rhythm, S1, S2, no murmurs, rubs or gallops Abdomen: Soft, non-tender, non-distended, normoactive bowel sounds, no HSM, Extremities: warm and well perfused, no clubbing, cyanosis, or edema Skin: No rashes or lesions Neuro: Non-focal ECOG PERFORMANCE STATUS: 0 - Asymptomatic  LABORATORY DATA: Lab Results  Component Value Date   WBC 5.3 07/31/2012   HGB 12.1 07/31/2012   HCT 38.1 07/31/2012   MCV 89.6 07/31/2012   PLT 247 07/31/2012      Chemistry      Component Value Date/Time   NA 137 07/31/2012 1332   NA 134* 06/02/2012 0515   NA 141 12/17/2011 1144   K 4.2 07/31/2012 1332   K 4.4 06/02/2012 0515   CL 105 07/31/2012 1332   CL 100 06/02/2012 0515   CO2 21* 07/31/2012 1332   CO2 25 06/02/2012 0515   BUN 19.0 07/31/2012 1332   BUN 20 06/02/2012 0515   BUN 15 12/17/2011 1144   CREATININE 0.7 07/31/2012 1332   CREATININE 0.76 06/02/2012 0515      Component Value Date/Time   CALCIUM 9.1 07/31/2012 1332   CALCIUM 8.7 06/02/2012 0515   ALKPHOS 88 07/31/2012 1332   ALKPHOS 85 05/26/2012 0955   AST 16 07/31/2012 1332   AST 22 05/26/2012 0955   ALT 22 07/31/2012 1332   ALT 16 05/26/2012 0955   BILITOT 0.46 07/31/2012 1332   BILITOT 0.3 05/26/2012 0955       RADIOGRAPHIC STUDIES:  No results found.  ASSESSMENT: 67 year old female with:  #1 DCIS of the breast. She is on tamoxifen 20 mg daily. However I have recommended that we discontinue the tamoxifen for now a do to her recent diagnosis of ovarian cancer.  #2 recent diagnosis of gynecologic malignancy this is ovarian carcinoma). Patient is currently receiving neoadjuvant chemotherapy consisting of Taxol and carboplatinum. Overall she is tolerating it well. Recent CT performed on January 8 showed a minute  response to chemotherapy. Total of 6 cycles of Taxol and carboplatinum was given.  #3 patient is now status post laparotomy performed on 05/30/2012. Unfortunately intraoperatively she was found to have significant residual disease. 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 Patient and I discussed the pathology results today. We also discussed further treatment options. This would include systemic chemotherapy. The regimen gemcitabine and carboplatinum with Avastin is a possibility. However I would like to have her recover from her surgery the next few weeks. She is also scheduled to be seen by Dr. Grant Ruts.   #4 patient has had chemotherapy sensitivity testing performed unfortunately the results were inconclusive. We discussed the possibility of doing to topotecan and Avastin. This is discussed with the patient. Dr. Gerre Pebbles has also recommended this. We discussed risks and benefits of this. She is here for cycle 1 day 1 of topo/avastin.  #5 depression/anxiety: Currently on Effexor with good response  PLAN: #1 proceed with cycle 1 day 8 of topotecan only.  #2 she'll return in one week's time for day 15 of her treatment.  All questions were answered. The patient knows to call the clinic with any problems, questions or concerns. We can certainly see the patient much sooner if necessary.  I spent 25 minutes counseling the patient face to face. The total time spent in the appointment was 30 minutes.  Drue Second, MD Medical/Oncology [facility] [phone] (beeper) [phone] (Office)  07/31/2012, 5:19 PM  .

## 2012-07-31 NOTE — Telephone Encounter (Signed)
Per staff message and POF I have scheduled appts.  JMW  

## 2012-07-31 NOTE — Patient Instructions (Addendum)
Proceed with Topo today  I will see you back in 1 week

## 2012-08-01 ENCOUNTER — Telehealth: Payer: Self-pay | Admitting: Oncology

## 2012-08-01 ENCOUNTER — Other Ambulatory Visit: Payer: Self-pay | Admitting: Emergency Medicine

## 2012-08-06 NOTE — Progress Notes (Signed)
OFFICE PROGRESS NOTE  CC KNAPP,[name], MD [address] [place] [address] Dr. Emelia Loron Dr. 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  PRIOR THERAPY: #1. S/P left breast lumpectomy in March 2011 after she had a screen detected 1.0 cm ER+, PR+, intermediate grade DCIS. Patient had 3 sentinel biopsied all of them were negative for metastatic disease. Patient underwent radiation therapy between 06/05/2009 through 07/02/2009. She was then begun on tamoxifen 20 mg daily in August 2011.  #2  diagnosis of gynecologic malignancy presenting with abdominal mass and pleural effusions. Patient was recently hospitalized with shortness of breath she was discovered to have malignant pleural effusion she is status post Pleurx catheter is. She also had malignant ascites she's had several paracentesis procedures performed during her hospitalization. During her hospitalization she was seen by gynecologic oncology.  #3 patient began neoadjuvant chemotherapy consisting of Taxol and carboplatinum. Her first cycle was administered during her hospitalization. She has completed aTotal of 6 cycles of Taxol and carboplatinum from 01/07/2012 - 06/23/2012. Overall she tolerated it well except for some fatigue and neuropathies.  #4 patient is  status post laparotomy that revealed significant residual disease. I discussed her for pathology from 05/30/2012  #5 patient is tissue was sent for chemotherapy sensitivity testing. We have yet to receive the results. However based on that we will begin salvage chemotherapy.  CURRENT THERAPY: palliative chemotherapy consisting of Avastin every 21 days with Topotecan on day 1, 8, 15, every 28 days  INTERVAL HISTORY: [name] [name] 67 y.o. female returns for an interim visit.  She was last seen by me one week ago. At that time I did begin her on Effexor XR. She does tell me that this does  help her considerably with her emotions. She does not chronically is easily. She does not have abdominal pain she has not noticed any bloating. She has been eating better. She has no nausea or vomiting. She does have some ongoing fatigue. She also has ongoing neuropathic type of pain. She has no fevers chills or night sweats. Remainder of the 10 point review of systems is negative.  MEDICAL HISTORY: Past Medical History  Diagnosis Date  . Interstitial cystitis     on chronic antibiotics  . Hyperlipidemia   . Frequent UTI     on prophylaxis  . 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 CAD (coronary artery disease)     a. s/p CABG 7/13;   b.  LHC 12/01/11:  pLAD 70%, mLAD 40%, CFX 40-50% prior to takeoff of the OM2, oRCA occluded, mid vessel filled via R->R collaterals and distal vessel filled by L->R collaterals, S-OM1/OM2 (small and diffusely dz) with mid 90% stenosis, 90% at OM1 anastomotic site, continuation of OM2 occluded, S-Dx patent, S-PDA occluded, L-LAD ok, EF 55-65%  => Med Rx rec.  [name] Hx of echocardiogram     Echo 5/13: EF 60-65%, grade 2 diastolic dysfunction  . Hypercholesterolemia   . Pleural effusion 12/28/11    s/p pleurx catheter  . GERD (gastroesophageal reflux disease)   . Arthritis     "just a little; lower back" (12/28/11)  . Gout attack 08/2011    related to "stress post OHS"  . Depression   . Breast cancer     "left"; on Tamoxifen  . S/P thoracentesis 12/28/11    "for pleural effusion" (12/28/2011)  . Ovarian cancer   . Tachycardia   . Neuromuscular  disorder     peripheral neuropathy    ALLERGIES:  is allergic to codeine; isosorbide; other; phenothiazines; talwin; yellow jacket venom; and ambien.  MEDICATIONS:  Current Outpatient Prescriptions  Medication Sig Dispense Refill  . ALPRAZolam (XANAX) 0.25 MG tablet Take 1 tablet (0.25 mg total) by mouth every 8 (eight) hours as needed for anxiety.  30 tablet  4  . aspirin 325 MG tablet Take 325 mg by mouth at bedtime.        [name] atorvastatin (LIPITOR) 20 MG tablet Take 20 mg by mouth at bedtime.       . carvedilol (COREG) 25 MG tablet Take 1 tablet (25 mg total) by mouth 2 (two) times daily.  180 tablet  3  . docusate sodium (COLACE) 100 MG capsule Take 200 mg by mouth daily.      . fluticasone (FLONASE) 50 MCG/ACT nasal spray Place 2 sprays into the nose daily as needed for allergies.      [name] omeprazole (PRILOSEC) 20 MG capsule Take 1 capsule (20 mg total) by mouth 2 (two) times daily.  60 capsule  12  . oxybutynin (DITROPAN-XL) 10 MG 24 hr tablet Take 10 mg by mouth daily.      Bertram Gala Glycol-Propyl Glycol 0.4-0.3 % SOLN Place 1 drop into both eyes every morning.       . SF 5000 PLUS 1.1 % CREA dental cream Place 1.1 % onto teeth at bedtime.       [name] trimethoprim (TRIMPEX) 100 MG tablet Take 100 mg by mouth every morning.       . venlafaxine XR (EFFEXOR-XR) 75 MG 24 hr capsule Take 75 mg  Orally daily  30 capsule  8  . Alum & Mag Hydroxide-Simeth (MAGIC MOUTHWASH) SOLN Take 5 mLs by mouth 4 (four) times daily as needed (mouth sores).      [name] dexamethasone (DECADRON) 4 MG tablet Take 2 tablets (8 mg total) by mouth 2 (two) times daily with a meal. Take daily starting the day after chemotherapy for 2 days. Take with food.  30 tablet  1  . EPINEPHrine (EPIPEN 2-PAK) 0.3 mg/0.3 mL DEVI Inject 0.3 mg into the muscle daily as needed (allergic reaction).       . gabapentin (NEURONTIN) 100 MG capsule Take 100 mg orally three times a day  90 capsule  6  . HYDROcodone-acetaminophen (NORCO/VICODIN) 5-325 MG per tablet Take 1-2 tablets by mouth every 4 (four) hours as needed.  40 tablet  2  . lidocaine-prilocaine (EMLA) cream Apply topically as needed.  30 g  1  . LORazepam (ATIVAN) 0.5 MG tablet Take 1 tablet (0.5 mg total) by mouth every 6 (six) hours as needed (Nausea or vomiting).  30 tablet  0  . nitroGLYCERIN (NITROSTAT) 0.4 MG SL tablet Place 0.4 mg under the tongue every 5 (five) minutes as needed. Chest pain      .  ondansetron (ZOFRAN) 8 MG tablet Take 1 tablet (8 mg total) by mouth 2 (two) times daily. Take two times a day starting the day after chemo for 2 days. Then take two times a day as needed for nausea or vomiting.  30 tablet  1  . prochlorperazine (COMPAZINE) 10 MG tablet Take 1 tablet (10 mg total) by mouth every 6 (six) hours as needed (Nausea or vomiting).  30 tablet  1  . prochlorperazine (COMPAZINE) 25 MG suppository Place 1 suppository (25 mg total) rectally every 12 (twelve) hours as needed for nausea.  12  suppository  3   No current facility-administered medications for this visit.   Facility-Administered Medications Ordered in Other Visits  Medication Dose Route Frequency Provider Last Rate Last Dose  . influenza  inactive virus vaccine (FLUZONE/FLUARIX) injection 0.5 mL  0.5 mL Intramuscular Once Joselyn Arrow, MD        SURGICAL HISTORY:  Past Surgical History  Procedure Laterality Date  . Breast lumpectomy  04/2009    left  . Cesarean section  1973; 1976  . Muscle release  1960    L neck and chest.; "when I was 13; pneumonia settled in my left neck"  . Coronary artery bypass graft  08/18/2011    Procedure: CORONARY ARTERY BYPASS GRAFTING (CABG);  Surgeon: Alleen Borne, MD;  Location: [facility] OR;  Service: Open Heart Surgery;  Laterality: N/A;  Coronary Artery Bypass Graft times five utilizing the left internal mammary artery and the left greater saphenous vein harvested endoscopically.  . Abdominal hysterectomy  1976  . Appendectomy  1976  . Portacath placement  01/06/2012    Procedure: INSERTION PORT-A-CATH;  Surgeon: Alleen Borne, MD;  Location: [facility] OR;  Service: Thoracic;  Laterality: Left;  . Chest tube insertion  01/06/2012    Procedure: INSERTION PLEURAL DRAINAGE CATHETER;  Surgeon: Alleen Borne, MD;  Location: [facility] OR;  Service: Thoracic;  Laterality: Bilateral;  . Removal of pleural drainage catheter Right 04/12/2012    Procedure: MINOR REMOVAL OF PLEURAL DRAINAGE CATHETER;   Surgeon: Alleen Borne, MD;  Location: [facility] OR;  Service: Thoracic;  Laterality: Right;  . Talc pleurodesis Left 04/12/2012    Procedure: Lurlean Nanny;  Surgeon: Alleen Borne, MD;  Location: [facility] OR;  Service: Thoracic;  Laterality: Left;  . Portacath placement Left 04/17/2012    Procedure: INSERTION PORT-A-CATH;  Surgeon: Alleen Borne, MD;  Location: [facility] OR;  Service: Thoracic;  Laterality: Left;  . Removal of pleural drainage catheter Left 04/17/2012    Procedure: REMOVAL OF PLEURAL DRAINAGE CATHETER;  Surgeon: Alleen Borne, MD;  Location: [facility] OR;  Service: Thoracic;  Laterality: Left;  . Port-a-cath removal Left 04/17/2012    Procedure: REMOVAL PORT-A-CATH;  Surgeon: Alleen Borne, MD;  Location: [facility] OR;  Service: Thoracic;  Laterality: Left;  . Cardiac catheterization    . Laparotomy Bilateral 05/30/2012    Procedure: Resection of umbilical mass, Partial omentectomy;  Surgeon: Jeannette Corpus, MD;  Location: [facility] ORS;  Service: Gynecology;  Laterality: Bilateral;    REVIEW OF SYSTEMS:   General: fatigue (+), night sweats (-), fever (-), pain (-) Lymph: palpable nodes (-) HEENT: vision changes (-), mucositis (-), gum bleeding (-), epistaxis (-) Cardiovascular: chest pain (-), palpitations (-) Pulmonary: shortness of breath (-), dyspnea on exertion (-), cough (-), hemoptysis (-) GI:  Early satiety (-), melena (-), dysphagia (-), nausea/vomiting (-), diarrhea (-) GU: dysuria (-), hematuria (-), incontinence (-) Musculoskeletal: joint swelling (-), joint pain (-), back pain (-) Neuro: weakness (-), numbness (+), headache (-), confusion (-) Skin: Rash (-), lesions (-), dryness (-) Psych: depression (-), suicidal/homicidal ideation (-), feeling of hopelessness (-)   PHYSICAL EXAMINATION:  BP 117/72  Pulse 91  Temp(Src) 98.3 F (36.8 C) (Oral)  Resp 20  Ht 5\' 6"  (1.676 m)  Wt 150 lb 4.8 oz (68.176 kg)  BMI 24.27 kg/m2  BP lying 112/74, HR 94, BP sitting 116/75, HR 95, BP standing  115/73, HR 105 General: Patient is an ill appearing female in no acute distress HEENT: PERRLA, sclerae  anicteric no conjunctival pallor, MMM, fluid in tympanic membranes Neck: supple, no palpable adenopathy Lungs: clear to auscultation bilaterally, no wheezes, rhonchi, or rales Cardiovascular: regular rate rhythm, S1, S2, no murmurs, rubs or gallops Abdomen: Soft, non-tender, non-distended, normoactive bowel sounds, no HSM, Extremities: warm and well perfused, no clubbing, cyanosis, or edema Skin: No rashes or lesions Neuro: Non-focal ECOG PERFORMANCE STATUS: 0 - Asymptomatic  LABORATORY DATA: Lab Results  Component Value Date   WBC 5.3 07/31/2012   HGB 12.1 07/31/2012   HCT 38.1 07/31/2012   MCV 89.6 07/31/2012   PLT 247 07/31/2012      Chemistry      Component Value Date/Time   NA 137 07/31/2012 1332   NA 134* 06/02/2012 0515   NA 141 12/17/2011 1144   K 4.2 07/31/2012 1332   K 4.4 06/02/2012 0515   CL 105 07/31/2012 1332   CL 100 06/02/2012 0515   CO2 21* 07/31/2012 1332   CO2 25 06/02/2012 0515   BUN 19.0 07/31/2012 1332   BUN 20 06/02/2012 0515   BUN 15 12/17/2011 1144   CREATININE 0.7 07/31/2012 1332   CREATININE 0.76 06/02/2012 0515      Component Value Date/Time   CALCIUM 9.1 07/31/2012 1332   CALCIUM 8.7 06/02/2012 0515   ALKPHOS 88 07/31/2012 1332   ALKPHOS 85 05/26/2012 0955   AST 16 07/31/2012 1332   AST 22 05/26/2012 0955   ALT 22 07/31/2012 1332   ALT 16 05/26/2012 0955   BILITOT 0.46 07/31/2012 1332   BILITOT 0.3 05/26/2012 0955       RADIOGRAPHIC STUDIES:  No results found.  ASSESSMENT: 67 year old female with:  #1 DCIS of the breast. She is on tamoxifen 20 mg daily. However I have recommended that we discontinue the tamoxifen for now a do to her recent diagnosis of ovarian cancer.  #2 recent diagnosis of gynecologic malignancy this is ovarian carcinoma). Patient is currently receiving neoadjuvant chemotherapy consisting of Taxol and carboplatinum. Overall she is  tolerating it well. Recent CT performed on January 8 showed a minute response to chemotherapy. Total of 6 cycles of Taxol and carboplatinum was given.  #3 patient is now status post laparotomy performed on 05/30/2012. Unfortunately intraoperatively she was found to have significant residual disease. 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 Patient and I discussed the pathology results today. We also discussed further treatment options. This would include systemic chemotherapy. The regimen gemcitabine and carboplatinum with Avastin is a possibility. However I would like to have her recover from her surgery the next few weeks. She is also scheduled to be seen by Dr. Grant Ruts.   #4 Patient's tumor was also sent for chemotherapy sensitivity testing. Results of this are not back yet. We discussed this again. Patient feels comfortable waiting for chemotherapy until the sensitivity results are back.  #5 depression/anxiety: Currently on Effexor with good response  PLAN: #1today we discussed the unfortunate results of the sensitivity testing. I was told by a deep dying on nurse that the cells did not grow. Because of this we will not be able to do her chemotherapy. However Dr. Duard Brady and I discussed the patient's situation. And she has recommended that we proceed with chemotherapy consisting of topotecan and Avastin. I discussed this with the patient and her husband. Patient is very comfortable starting this. However she would like to start this after Mother's Day and do it on a Monday  so that she does not have to deal with the side effects over the weekend.  #2 patient and I discussed risks and benefits of chemotherapy today. We discussed the side effects of Avastin as well. She does understand that she would receive the Topotecan on a weekly basis day 1, 8, 15. The cycle will be repeated every 28 days. The Avastin will be  given every 21 days.  #3 we will begin this on 07/24/2012.  All questions were answered. The patient knows to call the clinic with any problems, questions or concerns. We can certainly see the patient much sooner if necessary.  I spent 25 minutes counseling the patient face to face. The total time spent in the appointment was 30 minutes.  Drue Second, MD Medical/Oncology [facility] [phone] (beeper) [phone] (Office)

## 2012-08-07 ENCOUNTER — Other Ambulatory Visit (HOSPITAL_BASED_OUTPATIENT_CLINIC_OR_DEPARTMENT_OTHER): Payer: BC Managed Care – PPO | Admitting: Lab

## 2012-08-07 ENCOUNTER — Encounter: Payer: Self-pay | Admitting: Oncology

## 2012-08-07 ENCOUNTER — Ambulatory Visit (HOSPITAL_BASED_OUTPATIENT_CLINIC_OR_DEPARTMENT_OTHER): Payer: BC Managed Care – PPO | Admitting: Oncology

## 2012-08-07 ENCOUNTER — Ambulatory Visit (HOSPITAL_BASED_OUTPATIENT_CLINIC_OR_DEPARTMENT_OTHER): Payer: BC Managed Care – PPO

## 2012-08-07 VITALS — BP 128/76 | HR 73 | Temp 98.8°F | Resp 20 | Ht 66.0 in | Wt 145.4 lb

## 2012-08-07 DIAGNOSIS — C569 Malignant neoplasm of unspecified ovary: Secondary | ICD-10-CM

## 2012-08-07 DIAGNOSIS — F341 Dysthymic disorder: Secondary | ICD-10-CM

## 2012-08-07 DIAGNOSIS — Z170 Estrogen receptor positive status [ER+]: Secondary | ICD-10-CM

## 2012-08-07 DIAGNOSIS — Z5111 Encounter for antineoplastic chemotherapy: Secondary | ICD-10-CM

## 2012-08-07 DIAGNOSIS — D0590 Unspecified type of carcinoma in situ of unspecified breast: Secondary | ICD-10-CM

## 2012-08-07 LAB — CBC WITH DIFFERENTIAL/PLATELET
BASO%: 0.8 % (ref 0.0–2.0)
HCT: 35.8 % (ref 34.8–46.6)
LYMPH%: 25.3 % (ref 14.0–49.7)
MCHC: 32.7 g/dL (ref 31.5–36.0)
MONO#: 0.1 10*3/uL (ref 0.1–0.9)
NEUT%: 64.2 % (ref 38.4–76.8)
Platelets: 104 10*3/uL — ABNORMAL LOW (ref 145–400)
WBC: 2.3 10*3/uL — ABNORMAL LOW (ref 3.9–10.3)

## 2012-08-07 LAB — COMPREHENSIVE METABOLIC PANEL (CC13)
ALT: 21 U/L (ref 0–55)
AST: 17 U/L (ref 5–34)
Albumin: 3.2 g/dL — ABNORMAL LOW (ref 3.5–5.0)
Alkaline Phosphatase: 78 U/L (ref 40–150)
Potassium: 3.8 mEq/L (ref 3.5–5.1)
Sodium: 137 mEq/L (ref 136–145)
Total Protein: 7.1 g/dL (ref 6.4–8.3)

## 2012-08-07 LAB — UA PROTEIN, DIPSTICK - CHCC: Protein, ur: NEGATIVE mg/dL

## 2012-08-07 MED ORDER — SODIUM CHLORIDE 0.9 % IV SOLN
Freq: Once | INTRAVENOUS | Status: AC
  Administered 2012-08-07: 13:00:00 via INTRAVENOUS

## 2012-08-07 MED ORDER — SODIUM CHLORIDE 0.9 % IJ SOLN
10.0000 mL | INTRAMUSCULAR | Status: DC | PRN
  Administered 2012-08-07: 10 mL
  Filled 2012-08-07: qty 10

## 2012-08-07 MED ORDER — TOPOTECAN HCL CHEMO INJECTION 4 MG
4.0000 mg/m2 | Freq: Once | INTRAVENOUS | Status: AC
  Administered 2012-08-07: 7 mg via INTRAVENOUS
  Filled 2012-08-07: qty 7

## 2012-08-07 MED ORDER — DEXAMETHASONE SODIUM PHOSPHATE 10 MG/ML IJ SOLN
10.0000 mg | Freq: Once | INTRAMUSCULAR | Status: AC
  Administered 2012-08-07: 10 mg via INTRAVENOUS

## 2012-08-07 MED ORDER — HEPARIN SOD (PORK) LOCK FLUSH 100 UNIT/ML IV SOLN
500.0000 [IU] | Freq: Once | INTRAVENOUS | Status: AC | PRN
  Administered 2012-08-07: 500 [IU]
  Filled 2012-08-07: qty 5

## 2012-08-07 MED ORDER — ONDANSETRON 8 MG/50ML IVPB (CHCC)
8.0000 mg | Freq: Once | INTRAVENOUS | Status: AC
Start: 2012-08-07 — End: 2012-08-07
  Administered 2012-08-07: 8 mg via INTRAVENOUS

## 2012-08-07 NOTE — Patient Instructions (Addendum)
[facility] Discharge Instructions for Patients Receiving Chemotherapy  Today you received the following chemotherapy agents Topotecan.  To help prevent nausea and vomiting after your treatment, we encourage you to take your nausea medication as prescribed.   If you develop nausea and vomiting that is not controlled by your nausea medication, call the clinic.   BELOW ARE SYMPTOMS THAT SHOULD BE REPORTED IMMEDIATELY:  *FEVER GREATER THAN 100.5 F  *CHILLS WITH OR WITHOUT FEVER  NAUSEA AND VOMITING THAT IS NOT CONTROLLED WITH YOUR NAUSEA MEDICATION  *UNUSUAL SHORTNESS OF BREATH  *UNUSUAL BRUISING OR BLEEDING  TENDERNESS IN MOUTH AND THROAT WITH OR WITHOUT PRESENCE OF ULCERS  *URINARY PROBLEMS  *BOWEL PROBLEMS  UNUSUAL RASH Items with * indicate a potential emergency and should be followed up as soon as possible.  Feel free to call the clinic you have any questions or concerns. The clinic phone number is [phone].    

## 2012-08-07 NOTE — Patient Instructions (Addendum)
proceed with topotecan today  I will see you back in 1 week

## 2012-08-07 NOTE — Progress Notes (Signed)
OFFICE PROGRESS NOTE  CC KNAPP,[name], MD [address] [place] [address] Dr. Emelia Loron Dr. 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  PRIOR THERAPY: #1. S/P left breast lumpectomy in March 2011 after she had a screen detected 1.0 cm ER+, PR+, intermediate grade DCIS. Patient had 3 sentinel biopsied all of them were negative for metastatic disease. Patient underwent radiation therapy between 06/05/2009 through 07/02/2009. She was then begun on tamoxifen 20 mg daily in August 2011.  #2  diagnosis of gynecologic malignancy presenting with abdominal mass and pleural effusions. Patient was recently hospitalized with shortness of breath she was discovered to have malignant pleural effusion she is status post Pleurx catheter is. She also had malignant ascites she's had several paracentesis procedures performed during her hospitalization. During her hospitalization she was seen by gynecologic oncology.  #3 patient began neoadjuvant chemotherapy consisting of Taxol and carboplatinum. Her first cycle was administered during her hospitalization. She has completed aTotal of 6 cycles of Taxol and carboplatinum from 01/07/2012 - 06/23/2012. Overall she tolerated it well except for some fatigue and neuropathies.  #4 patient is  status post laparotomy that revealed significant residual disease. I discussed her for pathology from 05/30/2012  #5  Patient began topotecan/avastin beginning on 07/24/12   CURRENT THERAPY: Here for cycle 15 topotecan.  INTERVAL HISTORY: [name] [name] 67 y.o. female returns for an interim visit. She seems to be looking much better. She denies any fevers chills night sweats headaches chest pains palpitations no myalgias and arthralgias she does have some shortness of breath especially on exertion but does not experience any chest pains or diaphoresis. She has no hematuria hematochezia  melena hemoptysis or hematemesis. Remainder of the 10 point review of systems is negative.  MEDICAL HISTORY: Past Medical History  Diagnosis Date  . Interstitial cystitis     on chronic antibiotics  . Hyperlipidemia   . Frequent UTI     on prophylaxis  . 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 CAD (coronary artery disease)     a. s/p CABG 7/13;   b.  LHC 12/01/11:  pLAD 70%, mLAD 40%, CFX 40-50% prior to takeoff of the OM2, oRCA occluded, mid vessel filled via R->R collaterals and distal vessel filled by L->R collaterals, S-OM1/OM2 (small and diffusely dz) with mid 90% stenosis, 90% at OM1 anastomotic site, continuation of OM2 occluded, S-Dx patent, S-PDA occluded, L-LAD ok, EF 55-65%  => Med Rx rec.  [name] Hx of echocardiogram     Echo 5/13: EF 60-65%, grade 2 diastolic dysfunction  . Hypercholesterolemia   . Pleural effusion 12/28/11    s/p pleurx catheter  . GERD (gastroesophageal reflux disease)   . Arthritis     "just a little; lower back" (12/28/11)  . Gout attack 08/2011    related to "stress post OHS"  . Depression   . Breast cancer     "left"; on Tamoxifen  . S/P thoracentesis 12/28/11    "for pleural effusion" (12/28/2011)  . Ovarian cancer   . Tachycardia   . Neuromuscular disorder     peripheral neuropathy    ALLERGIES:  is allergic to codeine; isosorbide; other; phenothiazines; talwin; yellow jacket venom; and ambien.  MEDICATIONS:  Current Outpatient Prescriptions  Medication Sig Dispense Refill  . ALPRAZolam (XANAX) 0.25 MG tablet Take 1 tablet (0.25 mg total) by mouth every 8 (eight) hours as needed for anxiety.  30 tablet  4  .  Alum & Mag Hydroxide-Simeth (MAGIC MOUTHWASH) SOLN Take 5 mLs by mouth 4 (four) times daily as needed (mouth sores).      [name] aspirin 325 MG tablet Take 325 mg by mouth at bedtime.       [name] atorvastatin (LIPITOR) 20 MG tablet Take 20 mg by mouth at bedtime.       . carvedilol (COREG) 25 MG tablet Take 1 tablet (25 mg total) by mouth 2 (two) times  daily.  180 tablet  3  . dexamethasone (DECADRON) 4 MG tablet Take 2 tablets (8 mg total) by mouth 2 (two) times daily with a meal. Take daily starting the day after chemotherapy for 2 days. Take with food.  30 tablet  1  . docusate sodium (COLACE) 100 MG capsule Take 200 mg by mouth daily.      [name] EPINEPHrine (EPIPEN 2-PAK) 0.3 mg/0.3 mL DEVI Inject 0.3 mg into the muscle daily as needed (allergic reaction).       . fluticasone (FLONASE) 50 MCG/ACT nasal spray Place 2 sprays into the nose daily as needed for allergies.      [name] gabapentin (NEURONTIN) 100 MG capsule Take 100 mg orally three times a day  90 capsule  6  . HYDROcodone-acetaminophen (NORCO/VICODIN) 5-325 MG per tablet Take 1-2 tablets by mouth every 4 (four) hours as needed.  40 tablet  2  . lidocaine-prilocaine (EMLA) cream Apply topically as needed.  30 g  1  . LORazepam (ATIVAN) 0.5 MG tablet Take 1 tablet (0.5 mg total) by mouth every 6 (six) hours as needed (Nausea or vomiting).  30 tablet  0  . nitroGLYCERIN (NITROSTAT) 0.4 MG SL tablet Place 0.4 mg under the tongue every 5 (five) minutes as needed. Chest pain      . omeprazole (PRILOSEC) 20 MG capsule Take 1 capsule (20 mg total) by mouth 2 (two) times daily.  60 capsule  12  . ondansetron (ZOFRAN) 8 MG tablet Take 1 tablet (8 mg total) by mouth 2 (two) times daily. Take two times a day starting the day after chemo for 2 days. Then take two times a day as needed for nausea or vomiting.  30 tablet  1  . oxybutynin (DITROPAN-XL) 10 MG 24 hr tablet Take 10 mg by mouth daily.      Bertram Gala Glycol-Propyl Glycol 0.4-0.3 % SOLN Place 1 drop into both eyes every morning.       . prochlorperazine (COMPAZINE) 10 MG tablet Take 1 tablet (10 mg total) by mouth every 6 (six) hours as needed (Nausea or vomiting).  30 tablet  1  . prochlorperazine (COMPAZINE) 25 MG suppository Place 1 suppository (25 mg total) rectally every 12 (twelve) hours as needed for nausea.  12 suppository  3  . SF 5000  PLUS 1.1 % CREA dental cream Place 1.1 % onto teeth at bedtime.       [name] trimethoprim (TRIMPEX) 100 MG tablet Take 100 mg by mouth every morning.       . venlafaxine XR (EFFEXOR-XR) 75 MG 24 hr capsule Take 75 mg  Orally daily  30 capsule  8   No current facility-administered medications for this visit.   Facility-Administered Medications Ordered in Other Visits  Medication Dose Route Frequency Provider Last Rate Last Dose  . influenza  inactive virus vaccine (FLUZONE/FLUARIX) injection 0.5 mL  0.5 mL Intramuscular Once Joselyn Arrow, MD        SURGICAL HISTORY:  Past Surgical History  Procedure Laterality Date  .  Breast lumpectomy  04/2009    left  . Cesarean section  1973; 1976  . Muscle release  1960    L neck and chest.; "when I was 13; pneumonia settled in my left neck"  . Coronary artery bypass graft  08/18/2011    Procedure: CORONARY ARTERY BYPASS GRAFTING (CABG);  Surgeon: Alleen Borne, MD;  Location: [facility] OR;  Service: Open Heart Surgery;  Laterality: N/A;  Coronary Artery Bypass Graft times five utilizing the left internal mammary artery and the left greater saphenous vein harvested endoscopically.  . Abdominal hysterectomy  1976  . Appendectomy  1976  . Portacath placement  01/06/2012    Procedure: INSERTION PORT-A-CATH;  Surgeon: Alleen Borne, MD;  Location: [facility] OR;  Service: Thoracic;  Laterality: Left;  . Chest tube insertion  01/06/2012    Procedure: INSERTION PLEURAL DRAINAGE CATHETER;  Surgeon: Alleen Borne, MD;  Location: [facility] OR;  Service: Thoracic;  Laterality: Bilateral;  . Removal of pleural drainage catheter Right 04/12/2012    Procedure: MINOR REMOVAL OF PLEURAL DRAINAGE CATHETER;  Surgeon: Alleen Borne, MD;  Location: [facility] OR;  Service: Thoracic;  Laterality: Right;  . Talc pleurodesis Left 04/12/2012    Procedure: Lurlean Nanny;  Surgeon: Alleen Borne, MD;  Location[facility] OR;  Service: Thoracic;  Laterality: Left;  . Portacath placement Left 04/17/2012    Procedure:  INSERTION PORT-A-CATH;  Surgeon: Alleen Borne, MD;  Location: [facility] OR;  Service: Thoracic;  Laterality: Left;  . Removal of pleural drainage catheter Left 04/17/2012    Procedure: REMOVAL OF PLEURAL DRAINAGE CATHETER;  Surgeon: Alleen Borne, MD;  Location: [facility] OR;  Service: Thoracic;  Laterality: Left;  . Port-a-cath removal Left 04/17/2012    Procedure: REMOVAL PORT-A-CATH;  Surgeon: Alleen Borne, MD;  Location: [facility] OR;  Service: Thoracic;  Laterality: Left;  . Cardiac catheterization    . Laparotomy Bilateral 05/30/2012    Procedure: Resection of umbilical mass, Partial omentectomy;  Surgeon: Jeannette Corpus, MD;  Location: [facility] ORS;  Service: Gynecology;  Laterality: Bilateral;    REVIEW OF SYSTEMS:   General: fatigue (+), night sweats (-), fever (-), pain (-) Lymph: palpable nodes (-) HEENT: vision changes (-), mucositis (-), gum bleeding (-), epistaxis (-) Cardiovascular: chest pain (-), palpitations (-) Pulmonary: shortness of breath (-), dyspnea on exertion (-), cough (-), hemoptysis (-) GI:  Early satiety (-), melena (-), dysphagia (-), nausea/vomiting (-), diarrhea (-) GU: dysuria (-), hematuria (-), incontinence (-) Musculoskeletal: joint swelling (-), joint pain (-), back pain (-) Neuro: weakness (-), numbness (+), headache (-), confusion (-) Skin: Rash (-), lesions (-), dryness (-) Psych: depression (-), suicidal/homicidal ideation (-), feeling of hopelessness (-)   PHYSICAL EXAMINATION:  BP 128/76  Pulse 73  Temp(Src) 98.8 F (37.1 C) (Oral)  Resp 20  Ht 5\' 6"  (1.676 m)  Wt 145 lb 6.4 oz (65.953 kg)  BMI 23.48 kg/m2  BP lying 112/74, HR 94, BP sitting 116/75, HR 95, BP standing 115/73, HR 105 General: Patient is an ill appearing female in no acute distress HEENT: PERRLA, sclerae anicteric no conjunctival pallor, MMM, fluid in tympanic membranes Neck: supple, no palpable adenopathy Lungs: clear to auscultation bilaterally, no wheezes, rhonchi, or  rales Cardiovascular: regular rate rhythm, S1, S2, no murmurs, rubs or gallops Abdomen: Soft, non-tender, non-distended, normoactive bowel sounds, no HSM, Extremities: warm and well perfused, no clubbing, cyanosis, or edema Skin: No rashes or lesions Neuro: Non-focal ECOG PERFORMANCE STATUS: 0 - Asymptomatic  LABORATORY DATA: Lab Results  Component Value Date   WBC 2.3* 08/07/2012   HGB 11.7 08/07/2012   HCT 35.8 08/07/2012   MCV 87.1 08/07/2012   PLT 104 confirmed both analyzers* 08/07/2012      Chemistry      Component Value Date/Time   NA 137 07/31/2012 1332   NA 134* 06/02/2012 0515   NA 141 12/17/2011 1144   K 4.2 07/31/2012 1332   K 4.4 06/02/2012 0515   CL 105 07/31/2012 1332   CL 100 06/02/2012 0515   CO2 21* 07/31/2012 1332   CO2 25 06/02/2012 0515   BUN 19.0 07/31/2012 1332   BUN 20 06/02/2012 0515   BUN 15 12/17/2011 1144   CREATININE 0.7 07/31/2012 1332   CREATININE 0.76 06/02/2012 0515      Component Value Date/Time   CALCIUM 9.1 07/31/2012 1332   CALCIUM 8.7 06/02/2012 0515   ALKPHOS 88 07/31/2012 1332   ALKPHOS 85 05/26/2012 0955   AST 16 07/31/2012 1332   AST 22 05/26/2012 0955   ALT 22 07/31/2012 1332   ALT 16 05/26/2012 0955   BILITOT 0.46 07/31/2012 1332   BILITOT 0.3 05/26/2012 0955       RADIOGRAPHIC STUDIES:  No results found.  ASSESSMENT: 67 year old female with:  #1 DCIS of the breast. She is on tamoxifen 20 mg daily. However I have recommended that we discontinue the tamoxifen for now a do to her recent diagnosis of ovarian cancer.  #2 recent diagnosis of gynecologic malignancy this is ovarian carcinoma). Patient is currently receiving neoadjuvant chemotherapy consisting of Taxol and carboplatinum. Overall she is tolerating it well. Recent CT performed on January 8 showed a minute response to chemotherapy. Total of 6 cycles of Taxol and carboplatinum was given.  #3 patient is now status post laparotomy performed on 05/30/2012. Unfortunately intraoperatively  she was found to have significant residual disease. 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 Patient and I discussed the pathology results today. We also discussed further treatment options. This would include systemic chemotherapy. The regimen gemcitabine and carboplatinum with Avastin is a possibility. However I would like to have her recover from her surgery the next few weeks. She is also scheduled to be seen by Dr. Grant Ruts.   #4 patient has had chemotherapy sensitivity testing performed unfortunately the results were inconclusive. We discussed the possibility of doing to topotecan and Avastin. This is discussed with the patient. Dr. Grant Ruts has also recommended this.   #5 patient was started topotecan and avastin  With topotecan q week on day 1, 8,15 on a 28 day cycle, avastin q 3 weeks  #6 depression/anxiety: Currently on Effexor with good response  PLAN: #1 proceed with cycle 1 day 15 of topotecan only.  #2 she'll return in one week's time for avastin  All questions were answered. The patient knows to call the clinic with any problems, questions or concerns. We can certainly see the patient much sooner if necessary.  I spent 25 minutes counseling the patient face to face. The total time spent in the appointment was 30 minutes.  Drue Second, MD Medical/Oncology [facility] [phone] (beeper) [phone] (Office)  08/07/2012, 11:55 AM  .

## 2012-08-07 NOTE — Progress Notes (Signed)
Dr. Lynelle Doctor on vacation

## 2012-08-14 ENCOUNTER — Ambulatory Visit (HOSPITAL_BASED_OUTPATIENT_CLINIC_OR_DEPARTMENT_OTHER): Payer: BC Managed Care – PPO

## 2012-08-14 ENCOUNTER — Encounter: Payer: Self-pay | Admitting: Oncology

## 2012-08-14 ENCOUNTER — Ambulatory Visit: Payer: BC Managed Care – PPO | Admitting: Oncology

## 2012-08-14 ENCOUNTER — Ambulatory Visit (HOSPITAL_BASED_OUTPATIENT_CLINIC_OR_DEPARTMENT_OTHER): Payer: Medicare Other | Admitting: Oncology

## 2012-08-14 ENCOUNTER — Other Ambulatory Visit (HOSPITAL_BASED_OUTPATIENT_CLINIC_OR_DEPARTMENT_OTHER): Payer: BC Managed Care – PPO | Admitting: Lab

## 2012-08-14 ENCOUNTER — Other Ambulatory Visit: Payer: BC Managed Care – PPO | Admitting: Lab

## 2012-08-14 VITALS — BP 129/79 | HR 92 | Temp 97.8°F | Resp 20 | Ht 66.0 in | Wt 146.2 lb

## 2012-08-14 DIAGNOSIS — D0590 Unspecified type of carcinoma in situ of unspecified breast: Secondary | ICD-10-CM

## 2012-08-14 DIAGNOSIS — K1379 Other lesions of oral mucosa: Secondary | ICD-10-CM

## 2012-08-14 DIAGNOSIS — C569 Malignant neoplasm of unspecified ovary: Secondary | ICD-10-CM

## 2012-08-14 DIAGNOSIS — D702 Other drug-induced agranulocytosis: Secondary | ICD-10-CM

## 2012-08-14 DIAGNOSIS — C786 Secondary malignant neoplasm of retroperitoneum and peritoneum: Secondary | ICD-10-CM

## 2012-08-14 DIAGNOSIS — E860 Dehydration: Secondary | ICD-10-CM

## 2012-08-14 DIAGNOSIS — K120 Recurrent oral aphthae: Secondary | ICD-10-CM

## 2012-08-14 LAB — COMPREHENSIVE METABOLIC PANEL (CC13)
Alkaline Phosphatase: 84 U/L (ref 40–150)
CO2: 25 mEq/L (ref 22–29)
Creatinine: 0.7 mg/dL (ref 0.6–1.1)
Glucose: 107 mg/dl (ref 70–140)
Total Bilirubin: 0.38 mg/dL (ref 0.20–1.20)

## 2012-08-14 LAB — CBC WITH DIFFERENTIAL/PLATELET
BASO%: 0 % (ref 0.0–2.0)
Basophils Absolute: 0 10*3/uL (ref 0.0–0.1)
EOS%: 11.4 % — ABNORMAL HIGH (ref 0.0–7.0)
HGB: 12 g/dL (ref 11.6–15.9)
MCH: 28.4 pg (ref 25.1–34.0)
MCHC: 32.5 g/dL (ref 31.5–36.0)
MCV: 87.4 fL (ref 79.5–101.0)
MONO%: 5.7 % (ref 0.0–14.0)
NEUT%: 41.4 % (ref 38.4–76.8)
RDW: 16.5 % — ABNORMAL HIGH (ref 11.2–14.5)
lymph#: 0.5 10*3/uL — ABNORMAL LOW (ref 0.9–3.3)

## 2012-08-14 LAB — UA PROTEIN, DIPSTICK - CHCC: Protein, ur: <30 mg/dL

## 2012-08-14 LAB — TECHNOLOGIST REVIEW

## 2012-08-14 MED ORDER — FILGRASTIM 480 MCG/0.8ML IJ SOLN
480.0000 ug | Freq: Once | INTRAMUSCULAR | Status: AC
  Administered 2012-08-14: 480 ug via SUBCUTANEOUS
  Filled 2012-08-14: qty 0.8

## 2012-08-14 MED ORDER — VALACYCLOVIR HCL 500 MG PO TABS: 500.0000 mg | ORAL_TABLET | Freq: Every day | ORAL | Status: DC

## 2012-08-14 MED ORDER — HEPARIN SOD (PORK) LOCK FLUSH 100 UNIT/ML IV SOLN
500.0000 [IU] | Freq: Once | INTRAVENOUS | Status: AC
  Administered 2012-08-14: 500 [IU] via INTRAVENOUS
  Filled 2012-08-14: qty 5

## 2012-08-14 MED ORDER — SODIUM CHLORIDE 0.9 % IJ SOLN
10.0000 mL | INTRAMUSCULAR | Status: DC | PRN
  Administered 2012-08-14: 10 mL via INTRAVENOUS
  Filled 2012-08-14: qty 10

## 2012-08-14 MED ORDER — SODIUM CHLORIDE 0.9 % IV SOLN
Freq: Once | INTRAVENOUS | Status: DC
  Administered 2012-08-14: 15:00:00 via INTRAVENOUS

## 2012-08-14 MED ORDER — CIPROFLOXACIN HCL 500 MG PO TABS: 500.0000 mg | ORAL_TABLET | Freq: Two times a day (BID) | ORAL | Status: DC

## 2012-08-14 NOTE — Patient Instructions (Addendum)
Dehydration, Adult Dehydration is when you lose more fluids from the body than you take in. Vital organs like the kidneys, brain, and heart cannot function without a proper amount of fluids and salt. Any loss of fluids from the body can cause dehydration.  CAUSES   Vomiting.  Diarrhea.  Excessive sweating.  Excessive urine output.  Fever. SYMPTOMS  Mild dehydration  Thirst.  Dry lips.  Slightly dry mouth. Moderate dehydration  Very dry mouth.  Sunken eyes.  Skin does not bounce back quickly when lightly pinched and released.  Dark urine and decreased urine production.  Decreased tear production.  Headache. Severe dehydration  Very dry mouth.  Extreme thirst.  Rapid, weak pulse (more than 100 beats per minute at rest).  Cold hands and feet.  Not able to sweat in spite of heat and temperature.  Rapid breathing.  Blue lips.  Confusion and lethargy.  Difficulty being awakened.  Minimal urine production.  No tears. DIAGNOSIS  Your caregiver will diagnose dehydration based on your symptoms and your exam. Blood and urine tests will help confirm the diagnosis. The diagnostic evaluation should also identify the cause of dehydration. TREATMENT  Treatment of mild or moderate dehydration can often be done at home by increasing the amount of fluids that you drink. It is best to drink small amounts of fluid more often. Drinking too much at one time can make vomiting worse. Refer to the home care instructions below. Severe dehydration needs to be treated at the hospital where you will probably be given intravenous (IV) fluids that contain water and electrolytes. HOME CARE INSTRUCTIONS   Ask your caregiver about specific rehydration instructions.  Drink enough fluids to keep your urine clear or pale yellow.  Drink small amounts frequently if you have nausea and vomiting.  Eat as you normally do.  Avoid:  Foods or drinks high in sugar.  Carbonated  drinks.  Juice.  Extremely hot or cold fluids.  Drinks with caffeine.  Fatty, greasy foods.  Alcohol.  Tobacco.  Overeating.  Gelatin desserts.  Wash your hands well to avoid spreading bacteria and viruses.  Only take over-the-counter or prescription medicines for pain, discomfort, or fever as directed by your caregiver.  Ask your caregiver if you should continue all prescribed and over-the-counter medicines.  Keep all follow-up appointments with your caregiver. SEEK MEDICAL CARE IF:  You have abdominal pain and it increases or stays in one area (localizes).  You have a rash, stiff neck, or severe headache.  You are irritable, sleepy, or difficult to awaken.  You are weak, dizzy, or extremely thirsty. SEEK IMMEDIATE MEDICAL CARE IF:   You are unable to keep fluids down or you get worse despite treatment.  You have frequent episodes of vomiting or diarrhea.  You have blood or green matter (bile) in your vomit.  You have blood in your stool or your stool looks black and tarry.  You have not urinated in 6 to 8 hours, or you have only urinated a small amount of very dark urine.  You have a fever.  You faint. MAKE SURE YOU:   Understand these instructions.  Will watch your condition.  Will get help right away if you are not doing well or get worse. Document Released: 02/01/2005 Document Revised: 04/26/2011 Document Reviewed: 09/21/2010 [employer] Patient Information 2014 [employer].  

## 2012-08-14 NOTE — Patient Instructions (Addendum)
#  1 patient is here for Avastin only today. However her total white count is only 1.2 and absolute neutrophil count is 0.5. She is neutropenic.  #2 patient and I discussed neutropenic precautions today. She will be placed on Cipro 500 mg twice a day prophylactically to help prevent infections.  #3 she will also begin Neupogen 480 mg daily for the next 5 days. She will get Monday through Thursday and then on Saturday.  #4 patient is also, cytopenic (low platelet count) she is at risk for bleeding we discussed this.  #5 patient also does have some mouth sores. I do think that she could have a herpetic outbreaks and she is neutropenic I have went ahead and given her a prescription for Valtrex 500 mg to be taken daily.  #6 she is dehydrated she will receive IV fluids today.  #7 I will see her back in one week's time at which time we will do topotecan as well as Avastin.

## 2012-08-14 NOTE — Progress Notes (Signed)
OFFICE PROGRESS NOTE  CC KNAPP,[name], MD [address] [place] [address] Dr. Emelia Loron Dr. 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  PRIOR THERAPY: #1. S/P left breast lumpectomy in March 2011 after she had a screen detected 1.0 cm ER+, PR+, intermediate grade DCIS. Patient had 3 sentinel biopsied all of them were negative for metastatic disease. Patient underwent radiation therapy between 06/05/2009 through 07/02/2009. She was then begun on tamoxifen 20 mg daily in August 2011.  #2  diagnosis of gynecologic malignancy presenting with abdominal mass and pleural effusions. Patient was recently hospitalized with shortness of breath she was discovered to have malignant pleural effusion she is status post Pleurx catheter is. She also had malignant ascites she's had several paracentesis procedures performed during her hospitalization. During her hospitalization she was seen by gynecologic oncology.  #3 patient began neoadjuvant chemotherapy consisting of Taxol and carboplatinum. Her first cycle was administered during her hospitalization. She has completed aTotal of 6 cycles of Taxol and carboplatinum from 01/07/2012 - 06/23/2012. Overall she tolerated it well except for some fatigue and neuropathies.  #4 patient is  status post laparotomy that revealed significant residual disease. I discussed her for pathology from 05/30/2012  #5  Patient began topotecan/avastin beginning on 07/24/12   CURRENT THERAPY: Patient is here for Avastin only (we will hold this)  INTERVAL HISTORY: [name] [name] 67 y.o. female returns for followup visit prior to her scheduled Avastin today. Unfortunately she is pancytopenic her white count is 1.2 neutrophils 0.5 and platelets are 85,000. She also feels very tired weak fatigued. She has not had any fevers chills or night sweats. She has been trying to drink but she is still  dehydrated. She also looks like she has a herpetic outbreak on her lips (herpes labialis). She has not had any nausea or vomiting. No peripheral paresthesias no easy bruising or bleeding. Remainder of the 10 point review of systems is negative MEDICAL HISTORY: Past Medical History  Diagnosis Date  . Interstitial cystitis     on chronic antibiotics  . Hyperlipidemia   . Frequent UTI     on prophylaxis  . 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 CAD (coronary artery disease)     a. s/p CABG 7/13;   b.  LHC 12/01/11:  pLAD 70%, mLAD 40%, CFX 40-50% prior to takeoff of the OM2, oRCA occluded, mid vessel filled via R->R collaterals and distal vessel filled by L->R collaterals, S-OM1/OM2 (small and diffusely dz) with mid 90% stenosis, 90% at OM1 anastomotic site, continuation of OM2 occluded, S-Dx patent, S-PDA occluded, L-LAD ok, EF 55-65%  => Med Rx rec.  [name] Hx of echocardiogram     Echo 5/13: EF 60-65%, grade 2 diastolic dysfunction  . Hypercholesterolemia   . Pleural effusion 12/28/11    s/p pleurx catheter  . GERD (gastroesophageal reflux disease)   . Arthritis     "just a little; lower back" (12/28/11)  . Gout attack 08/2011    related to "stress post OHS"  . Depression   . Breast cancer     "left"; on Tamoxifen  . S/P thoracentesis 12/28/11    "for pleural effusion" (12/28/2011)  . Ovarian cancer   . Tachycardia   . Neuromuscular disorder     peripheral neuropathy    ALLERGIES:  is allergic to codeine; isosorbide; other; phenothiazines; talwin; yellow jacket venom; and ambien.  MEDICATIONS:  Current Outpatient Prescriptions  Medication Sig Dispense Refill  . ALPRAZolam (XANAX) 0.25 MG tablet Take 1 tablet (0.25 mg total) by mouth every 8 (eight) hours as needed for anxiety.  30 tablet  4  . Alum & Mag Hydroxide-Simeth (MAGIC MOUTHWASH) SOLN Take 5 mLs by mouth 4 (four) times daily as needed (mouth sores).      [name] aspirin 325 MG tablet Take 325 mg by mouth at bedtime.       [name] atorvastatin  (LIPITOR) 20 MG tablet Take 20 mg by mouth at bedtime.       . carvedilol (COREG) 25 MG tablet Take 1 tablet (25 mg total) by mouth 2 (two) times daily.  180 tablet  3  . docusate sodium (COLACE) 100 MG capsule Take 200 mg by mouth daily.      . fluticasone (FLONASE) 50 MCG/ACT nasal spray Place 2 sprays into the nose daily as needed for allergies.      [name] gabapentin (NEURONTIN) 100 MG capsule Take 100 mg orally three times a day  90 capsule  6  . HYDROcodone-acetaminophen (NORCO/VICODIN) 5-325 MG per tablet Take 1-2 tablets by mouth every 4 (four) hours as needed.  40 tablet  2  . lidocaine-prilocaine (EMLA) cream Apply topically as needed.  30 g  1  . LORazepam (ATIVAN) 0.5 MG tablet Take 1 tablet (0.5 mg total) by mouth every 6 (six) hours as needed (Nausea or vomiting).  30 tablet  0  . omeprazole (PRILOSEC) 20 MG capsule Take 1 capsule (20 mg total) by mouth 2 (two) times daily.  60 capsule  12  . ondansetron (ZOFRAN) 8 MG tablet Take 1 tablet (8 mg total) by mouth 2 (two) times daily. Take two times a day starting the day after chemo for 2 days. Then take two times a day as needed for nausea or vomiting.  30 tablet  1  . oxybutynin (DITROPAN-XL) 10 MG 24 hr tablet Take 10 mg by mouth daily.      Bertram Gala Glycol-Propyl Glycol 0.4-0.3 % SOLN Place 1 drop into both eyes every morning.       . SF 5000 PLUS 1.1 % CREA dental cream Place 1.1 % onto teeth at bedtime.       [name] trimethoprim (TRIMPEX) 100 MG tablet Take 100 mg by mouth every morning.       . venlafaxine XR (EFFEXOR-XR) 75 MG 24 hr capsule Take 75 mg  Orally daily  30 capsule  8  . ciprofloxacin (CIPRO) 500 MG tablet Take 1 tablet (500 mg total) by mouth 2 (two) times daily.  14 tablet  4  . dexamethasone (DECADRON) 4 MG tablet Take 2 tablets (8 mg total) by mouth 2 (two) times daily with a meal. Take daily starting the day after chemotherapy for 2 days. Take with food.  30 tablet  1  . EPINEPHrine (EPIPEN 2-PAK) 0.3 mg/0.3 mL DEVI  Inject 0.3 mg into the muscle daily as needed (allergic reaction).       . nitroGLYCERIN (NITROSTAT) 0.4 MG SL tablet Place 0.4 mg under the tongue every 5 (five) minutes as needed. Chest pain      . prochlorperazine (COMPAZINE) 10 MG tablet Take 1 tablet (10 mg total) by mouth every 6 (six) hours as needed (Nausea or vomiting).  30 tablet  1  . prochlorperazine (COMPAZINE) 25 MG suppository Place 1 suppository (25 mg total) rectally every 12 (twelve) hours as needed for nausea.  12 suppository  3  . valACYclovir (VALTREX) 500  MG tablet Take 1 tablet (500 mg total) by mouth daily.  7 tablet  4   Current Facility-Administered Medications  Medication Dose Route Frequency Provider Last Rate Last Dose  . 0.9 %  sodium chloride infusion   Intravenous Once Victorino December, MD       Facility-Administered Medications Ordered in Other Visits  Medication Dose Route Frequency Provider Last Rate Last Dose  . influenza  inactive virus vaccine (FLUZONE/FLUARIX) injection 0.5 mL  0.5 mL Intramuscular Once Joselyn Arrow, MD        SURGICAL HISTORY:  Past Surgical History  Procedure Laterality Date  . Breast lumpectomy  04/2009    left  . Cesarean section  1973; 1976  . Muscle release  1960    L neck and chest.; "when I was 13; pneumonia settled in my left neck"  . Coronary artery bypass graft  08/18/2011    Procedure: CORONARY ARTERY BYPASS GRAFTING (CABG);  Surgeon: Alleen Borne, MD;  Location: [facility] OR;  Service: Open Heart Surgery;  Laterality: N/A;  Coronary Artery Bypass Graft times five utilizing the left internal mammary artery and the left greater saphenous vein harvested endoscopically.  . Abdominal hysterectomy  1976  . Appendectomy  1976  . Portacath placement  01/06/2012    Procedure: INSERTION PORT-A-CATH;  Surgeon: Alleen Borne, MD;  Location: [facility] OR;  Service: Thoracic;  Laterality: Left;  . Chest tube insertion  01/06/2012    Procedure: INSERTION PLEURAL DRAINAGE CATHETER;  Surgeon: Alleen Borne, MD;  Location: [facility] OR;  Service: Thoracic;  Laterality: Bilateral;  . Removal of pleural drainage catheter Right 04/12/2012    Procedure: MINOR REMOVAL OF PLEURAL DRAINAGE CATHETER;  Surgeon: Alleen Borne, MD;  Location: [facility] OR;  Service: Thoracic;  Laterality: Right;  . Talc pleurodesis Left 04/12/2012    Procedure: Lurlean Nanny;  Surgeon: Alleen Borne, MD;  Location: [facility] OR;  Service: Thoracic;  Laterality: Left;  . Portacath placement Left 04/17/2012    Procedure: INSERTION PORT-A-CATH;  Surgeon: Alleen Borne, MD;  Location: [facility] OR;  Service: Thoracic;  Laterality: Left;  . Removal of pleural drainage catheter Left 04/17/2012    Procedure: REMOVAL OF PLEURAL DRAINAGE CATHETER;  Surgeon: Alleen Borne, MD;  Location: [facility] OR;  Service: Thoracic;  Laterality: Left;  . Port-a-cath removal Left 04/17/2012    Procedure: REMOVAL PORT-A-CATH;  Surgeon: Alleen Borne, MD;  Location: [facility] OR;  Service: Thoracic;  Laterality: Left;  . Cardiac catheterization    . Laparotomy Bilateral 05/30/2012    Procedure: Resection of umbilical mass, Partial omentectomy;  Surgeon: Jeannette Corpus, MD;  Location: [facility] ORS;  Service: Gynecology;  Laterality: Bilateral;    REVIEW OF SYSTEMS:   General: fatigue (+), night sweats (-), fever (-), pain (-) Lymph: palpable nodes (-) HEENT: vision changes (-), mucositis (-), gum bleeding (-), epistaxis (-) Cardiovascular: chest pain (-), palpitations (-) Pulmonary: shortness of breath (-), dyspnea on exertion (-), cough (-), hemoptysis (-) GI:  Early satiety (-), melena (-), dysphagia (-), nausea/vomiting (-), diarrhea (-) GU: dysuria (-), hematuria (-), incontinence (-) Musculoskeletal: joint swelling (-), joint pain (-), back pain (-) Neuro: weakness (-), numbness (+), headache (-), confusion (-) Skin: Rash (-), lesions (-), dryness (-) Psych: depression (-), suicidal/homicidal ideation (-), feeling of hopelessness (-)   PHYSICAL EXAMINATION:  BP 129/79   Pulse 92  Temp(Src) 97.8 F (36.6 C) (Oral)  Resp 20  Ht 5\' 6"  (1.676 m)  Wt 146  lb 3.2 oz (66.316 kg)  BMI 23.61 kg/m2  BP lying 112/74, HR 94, BP sitting 116/75, HR 95, BP standing 115/73, HR 105 General: Patient is an ill appearing female in no acute distress HEENT: PERRLA, sclerae anicteric no conjunctival pallor, MMM, fluid in tympanic membranes Neck: supple, no palpable adenopathy Lungs: clear to auscultation bilaterally, no wheezes, rhonchi, or rales Cardiovascular: regular rate rhythm, S1, S2, no murmurs, rubs or gallops Abdomen: Soft, non-tender, non-distended, normoactive bowel sounds, no HSM, Extremities: warm and well perfused, no clubbing, cyanosis, or edema Skin: No rashes or lesions Neuro: Non-focal ECOG PERFORMANCE STATUS: 0 - Asymptomatic  LABORATORY DATA: Lab Results  Component Value Date   WBC 1.2* 08/14/2012   HGB 12.0 08/14/2012   HCT 36.9 08/14/2012   MCV 87.4 08/14/2012   PLT 85* 08/14/2012      Chemistry      Component Value Date/Time   NA 137 08/07/2012 1108   NA 134* 06/02/2012 0515   NA 141 12/17/2011 1144   K 3.8 08/07/2012 1108   K 4.4 06/02/2012 0515   CL 103 08/07/2012 1108   CL 100 06/02/2012 0515   CO2 23 08/07/2012 1108   CO2 25 06/02/2012 0515   BUN 14.8 08/07/2012 1108   BUN 20 06/02/2012 0515   BUN 15 12/17/2011 1144   CREATININE 0.7 08/07/2012 1108   CREATININE 0.76 06/02/2012 0515      Component Value Date/Time   CALCIUM 9.1 08/07/2012 1108   CALCIUM 8.7 06/02/2012 0515   ALKPHOS 78 08/07/2012 1108   ALKPHOS 85 05/26/2012 0955   AST 17 08/07/2012 1108   AST 22 05/26/2012 0955   ALT 21 08/07/2012 1108   ALT 16 05/26/2012 0955   BILITOT 0.45 08/07/2012 1108   BILITOT 0.3 05/26/2012 0955       RADIOGRAPHIC STUDIES:  No results found.  ASSESSMENT: 67 year old female with:  #1 DCIS of the breast. She is on tamoxifen 20 mg daily. However I have recommended that we discontinue the tamoxifen for now a do to her recent diagnosis of ovarian  cancer.  #2 recent diagnosis of gynecologic malignancy this is ovarian carcinoma). Patient is currently receiving neoadjuvant chemotherapy consisting of Taxol and carboplatinum. Overall she is tolerating it well. Recent CT performed on January 8 showed a minute response to chemotherapy. Total of 6 cycles of Taxol and carboplatinum was given.  #3 patient is now status post laparotomy performed on 05/30/2012. Unfortunately intraoperatively she was found to have significant residual disease. 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 Patient and I discussed the pathology results today. We also discussed further treatment options. This would include systemic chemotherapy. The regimen gemcitabine and carboplatinum with Avastin is a possibility. However I would like to have her recover from her surgery the next few weeks. She is also scheduled to be seen by Dr. Grant Ruts.   #4 patient has had chemotherapy sensitivity testing performed unfortunately the results were inconclusive. We discussed the possibility of doing to topotecan and Avastin. This is discussed with the patient. Dr. Grant Ruts has also recommended this.   #5 patient was started topotecan and avastin  With topotecan q week on day 1, 8,15 on a 28 day cycle, avastin q 3 weeks  #6 depression/anxiety: Currently on Effexor with good response  #7 neutropenia and from the cytopenia  #8 dehydration  #9 cold sores/herpes labialis.  PLAN: #1 patient is here for Avastin only today.  However her total white count is only 1.2 and absolute neutrophil count is 0.5. She is neutropenic.   #2 patient and I discussed neutropenic precautions today. She will be placed on Cipro 500 mg twice a day prophylactically to help prevent infections.   #3 she will also begin Neupogen 480 mg daily for the next 5 days. She will get Monday through Thursday and then on Saturday.   #4  patient is also, cytopenic (low platelet count) she is at risk for bleeding we discussed this .  #5 patient also does have some mouth sores. I do think that she could have a herpetic outbreaks and she is neutropenic I have went ahead and given her a prescription for Valtrex 500 mg to be taken daily.   #6 she is dehydrated she will receive IV fluids today.   #7 I will see her back in one week's time at which time we will do topotecan as well as Avastin  All questions were answered. The patient knows to call the clinic with any problems, questions or concerns. We can certainly see the patient much sooner if necessary.  I spent 25 minutes counseling the patient face to face. The total time spent in the appointment was 30 minutes.  Drue Second, MD Medical/Oncology [facility] [phone] (beeper) [phone] (Office)  08/14/2012, 2:42 PM  .

## 2012-08-15 ENCOUNTER — Telehealth: Payer: Self-pay | Admitting: Oncology

## 2012-08-15 ENCOUNTER — Ambulatory Visit (HOSPITAL_BASED_OUTPATIENT_CLINIC_OR_DEPARTMENT_OTHER): Payer: Medicare Other

## 2012-08-15 VITALS — BP 125/74 | HR 96 | Temp 100.4°F

## 2012-08-15 DIAGNOSIS — C569 Malignant neoplasm of unspecified ovary: Secondary | ICD-10-CM

## 2012-08-15 DIAGNOSIS — C786 Secondary malignant neoplasm of retroperitoneum and peritoneum: Secondary | ICD-10-CM

## 2012-08-15 DIAGNOSIS — D702 Other drug-induced agranulocytosis: Secondary | ICD-10-CM

## 2012-08-15 MED ORDER — FILGRASTIM 480 MCG/0.8ML IJ SOLN
480.0000 ug | Freq: Once | INTRAMUSCULAR | Status: AC
  Administered 2012-08-15: 480 ug via SUBCUTANEOUS
  Filled 2012-08-15: qty 0.8

## 2012-08-15 NOTE — Patient Instructions (Addendum)
Filgrastim, G-CSF injection What is this medicine? FILGRASTIM, G-CSF (fil GRA stim) stimulates the formation of white blood cells. This medicine is given to patients with conditions that may cause a decrease in white blood cells, like those receiving certain types of chemotherapy or bone marrow transplant. It helps the bone marrow recover its ability to produce white blood cells. Increasing the amount of white blood cells helps to decrease the risk of infection and fever. This medicine may be used for other purposes; ask your health care provider or pharmacist if you have questions. What should I tell my health care provider before I take this medicine? They need to know if you have any of these conditions: -currently receiving radiation therapy -sickle cell disease -an unusual or allergic reaction to filgrastim, E. coli protein, other medicines, foods, dyes, or preservatives -pregnant or trying to get pregnant -breast-feeding How should I use this medicine? This medicine is for injection into a vein or injection under the skin. It is usually given by a health care professional in a hospital or clinic setting. If you get this medicine at home, you will be taught how to prepare and give this medicine. Always change the site for the injection under the skin. Let the solution warm to room temperature before you use it. Do not shake the solution before you withdraw a dose. Throw away any unused portion. Use exactly as directed. Take your medicine at regular intervals. Do not take your medicine more often than directed. It is important that you put your used needles and syringes in a special sharps container. Do not put them in a trash can. If you do not have a sharps container, call your pharmacist or healthcare provider to get one. Talk to your pediatrician regarding the use of this medicine in children. While this medicine may be prescribed for children for selected conditions, precautions do  apply. Overdosage: If you think you have taken too much of this medicine contact a poison control center or emergency room at once. NOTE: This medicine is only for you. Do not share this medicine with others. What if I miss a dose? Try not to miss doses. If you miss a dose take the dose as soon as you remember. If it is almost time for the next dose, do not take double doses unless told to by your doctor or health care professional. What may interact with this medicine? -lithium -medicines for cancer chemotherapy This list may not describe all possible interactions. Give your health care provider a list of all the medicines, herbs, non-prescription drugs, or dietary supplements you use. Also tell them if you smoke, drink alcohol, or use illegal drugs. Some items may interact with your medicine. What should I watch for while using this medicine? Visit your doctor or health care professional for regular checks on your progress. If you get a fever or any sign of infection while you are using this medicine, do not treat yourself. Check with your doctor or health care professional. Bone pain can usually be relieved by mild pain relievers such as acetaminophen or ibuprofen. Check with your doctor or health care professional before taking these medicines as they may hide a fever. Call your doctor or health care professional if the aches and pains are severe or do not go away. What side effects may I notice from receiving this medicine? Side effects that you should report to your doctor or health care professional as soon as possible: -allergic reactions like skin rash, itching   or hives, swelling of the face, lips, or tongue -difficulty breathing, wheezing -fever -pain, redness, or swelling at the injection site -stomach or side pain, or pain at the shoulder Side effects that usually do not require medical attention (report to your doctor or health care professional if they continue or are  bothersome): -bone pain (ribs, lower back, breast bone) -headache -skin rash This list may not describe all possible side effects. Call your doctor for medical advice about side effects. You may report side effects to FDA at 1-800-FDA-1088. Where should I keep my medicine? Keep out of the reach of children. Store in a refrigerator between 2 and 8 degrees C (36 and 46 degrees F). Do not freeze or leave in direct sunlight. If vials or syringes are left out of the refrigerator for more than 24 hours, they must be thrown away. Throw away unused vials after the expiration date on the carton. NOTE: This sheet is a summary. It may not cover all possible information. If you have questions about this medicine, talk to your doctor, pharmacist, or health care provider.  2013, [employer]. (04/19/2007 1:33:21 PM)  

## 2012-08-16 ENCOUNTER — Ambulatory Visit (HOSPITAL_BASED_OUTPATIENT_CLINIC_OR_DEPARTMENT_OTHER): Payer: Medicare Other

## 2012-08-16 VITALS — BP 117/61 | HR 96 | Temp 98.7°F

## 2012-08-16 DIAGNOSIS — C801 Malignant (primary) neoplasm, unspecified: Secondary | ICD-10-CM

## 2012-08-16 DIAGNOSIS — D702 Other drug-induced agranulocytosis: Secondary | ICD-10-CM

## 2012-08-16 DIAGNOSIS — C569 Malignant neoplasm of unspecified ovary: Secondary | ICD-10-CM

## 2012-08-16 MED ORDER — FILGRASTIM 480 MCG/0.8ML IJ SOLN
480.0000 ug | Freq: Once | INTRAMUSCULAR | Status: AC
  Administered 2012-08-16: 480 ug via SUBCUTANEOUS
  Filled 2012-08-16: qty 0.8

## 2012-08-17 ENCOUNTER — Ambulatory Visit (HOSPITAL_BASED_OUTPATIENT_CLINIC_OR_DEPARTMENT_OTHER): Payer: Medicare Other

## 2012-08-17 VITALS — BP 108/80 | HR 103 | Temp 100.0°F

## 2012-08-17 DIAGNOSIS — C786 Secondary malignant neoplasm of retroperitoneum and peritoneum: Secondary | ICD-10-CM

## 2012-08-17 DIAGNOSIS — D709 Neutropenia, unspecified: Secondary | ICD-10-CM

## 2012-08-17 DIAGNOSIS — C569 Malignant neoplasm of unspecified ovary: Secondary | ICD-10-CM

## 2012-08-17 MED ORDER — FILGRASTIM 480 MCG/0.8ML IJ SOLN
480.0000 ug | Freq: Once | INTRAMUSCULAR | Status: AC
  Administered 2012-08-17: 480 ug via SUBCUTANEOUS
  Filled 2012-08-17: qty 0.8

## 2012-08-17 NOTE — Progress Notes (Signed)
Temp 100.7  States she is taking Cipro and drinking lots of fluids.   Rechecked temp  100.0. Dr Welton Flakes notified of this.  Instructed to continue on Cipro and Neupogen injections (next one due on 7/5) and neutropenic precautions. Patient and husband state understanding of instructions.  Staying away from crowds, and using mask as needed.

## 2012-08-19 ENCOUNTER — Ambulatory Visit (HOSPITAL_BASED_OUTPATIENT_CLINIC_OR_DEPARTMENT_OTHER): Payer: Medicare Other

## 2012-08-19 VITALS — BP 103/56 | HR 92 | Temp 98.0°F

## 2012-08-19 DIAGNOSIS — D702 Other drug-induced agranulocytosis: Secondary | ICD-10-CM

## 2012-08-19 DIAGNOSIS — C569 Malignant neoplasm of unspecified ovary: Secondary | ICD-10-CM

## 2012-08-19 DIAGNOSIS — C801 Malignant (primary) neoplasm, unspecified: Secondary | ICD-10-CM

## 2012-08-19 MED ORDER — FILGRASTIM 480 MCG/0.8ML IJ SOLN
480.0000 ug | Freq: Once | INTRAMUSCULAR | Status: AC
  Administered 2012-08-19: 480 ug via SUBCUTANEOUS

## 2012-08-21 ENCOUNTER — Other Ambulatory Visit (HOSPITAL_BASED_OUTPATIENT_CLINIC_OR_DEPARTMENT_OTHER): Payer: BC Managed Care – PPO | Admitting: Lab

## 2012-08-21 ENCOUNTER — Encounter: Payer: Self-pay | Admitting: Oncology

## 2012-08-21 ENCOUNTER — Ambulatory Visit (HOSPITAL_BASED_OUTPATIENT_CLINIC_OR_DEPARTMENT_OTHER): Payer: BC Managed Care – PPO

## 2012-08-21 ENCOUNTER — Telehealth: Payer: Self-pay | Admitting: Oncology

## 2012-08-21 ENCOUNTER — Ambulatory Visit (HOSPITAL_BASED_OUTPATIENT_CLINIC_OR_DEPARTMENT_OTHER): Payer: BC Managed Care – PPO | Admitting: Oncology

## 2012-08-21 ENCOUNTER — Telehealth: Payer: Self-pay | Admitting: *Deleted

## 2012-08-21 VITALS — BP 101/66 | HR 105 | Temp 98.4°F | Resp 20 | Ht 66.0 in | Wt 147.8 lb

## 2012-08-21 DIAGNOSIS — B009 Herpesviral infection, unspecified: Secondary | ICD-10-CM

## 2012-08-21 DIAGNOSIS — Z5112 Encounter for antineoplastic immunotherapy: Secondary | ICD-10-CM

## 2012-08-21 DIAGNOSIS — Z5111 Encounter for antineoplastic chemotherapy: Secondary | ICD-10-CM

## 2012-08-21 DIAGNOSIS — E860 Dehydration: Secondary | ICD-10-CM

## 2012-08-21 DIAGNOSIS — D0590 Unspecified type of carcinoma in situ of unspecified breast: Secondary | ICD-10-CM

## 2012-08-21 DIAGNOSIS — C569 Malignant neoplasm of unspecified ovary: Secondary | ICD-10-CM

## 2012-08-21 LAB — CBC WITH DIFFERENTIAL/PLATELET
BASO%: 0.9 % (ref 0.0–2.0)
EOS%: 0.5 % (ref 0.0–7.0)
HCT: 34.7 % — ABNORMAL LOW (ref 34.8–46.6)
LYMPH%: 6.5 % — ABNORMAL LOW (ref 14.0–49.7)
MCH: 28.2 pg (ref 25.1–34.0)
MCHC: 31.7 g/dL (ref 31.5–36.0)
MCV: 89 fL (ref 79.5–101.0)
MONO%: 9.1 % (ref 0.0–14.0)
NEUT%: 83 % — ABNORMAL HIGH (ref 38.4–76.8)
Platelets: 342 10*3/uL (ref 145–400)
RBC: 3.9 10*6/uL (ref 3.70–5.45)
WBC: 18.4 10*3/uL — ABNORMAL HIGH (ref 3.9–10.3)
nRBC: 1 % — ABNORMAL HIGH (ref 0–0)

## 2012-08-21 LAB — COMPREHENSIVE METABOLIC PANEL (CC13)
BUN: 11.9 mg/dL (ref 7.0–26.0)
CO2: 24 mEq/L (ref 22–29)
Calcium: 9.2 mg/dL (ref 8.4–10.4)
Chloride: 99 mEq/L (ref 98–109)
Creatinine: 0.8 mg/dL (ref 0.6–1.1)
Glucose: 84 mg/dl (ref 70–140)
Total Bilirubin: 0.34 mg/dL (ref 0.20–1.20)

## 2012-08-21 LAB — UA PROTEIN, DIPSTICK - CHCC: Protein, ur: <30 mg/dL

## 2012-08-21 MED ORDER — SODIUM CHLORIDE 0.9 % IV SOLN
Freq: Once | INTRAVENOUS | Status: DC
  Administered 2012-08-21: 12:00:00 via INTRAVENOUS

## 2012-08-21 MED ORDER — CIPROFLOXACIN HCL 500 MG PO TABS: 500.0000 mg | ORAL_TABLET | Freq: Two times a day (BID) | ORAL | Status: DC

## 2012-08-21 MED ORDER — ONDANSETRON 8 MG/50ML IVPB (CHCC)
8.0000 mg | Freq: Once | INTRAVENOUS | Status: AC
  Administered 2012-08-21: 8 mg via INTRAVENOUS

## 2012-08-21 MED ORDER — SODIUM CHLORIDE 0.9 % IV SOLN
Freq: Once | INTRAVENOUS | Status: AC
  Administered 2012-08-21: 12:00:00 via INTRAVENOUS

## 2012-08-21 MED ORDER — SODIUM CHLORIDE 0.9 % IV SOLN
15.0000 mg/kg | Freq: Once | INTRAVENOUS | Status: AC
  Administered 2012-08-21: 1025 mg via INTRAVENOUS
  Filled 2012-08-21: qty 41

## 2012-08-21 MED ORDER — HEPARIN SOD (PORK) LOCK FLUSH 100 UNIT/ML IV SOLN
500.0000 [IU] | Freq: Once | INTRAVENOUS | Status: DC | PRN
Start: 2012-08-21 — End: 2012-08-21
  Filled 2012-08-21: qty 5

## 2012-08-21 MED ORDER — DEXAMETHASONE SODIUM PHOSPHATE 10 MG/ML IJ SOLN
10.0000 mg | Freq: Once | INTRAMUSCULAR | Status: AC
  Administered 2012-08-21: 10 mg via INTRAVENOUS

## 2012-08-21 MED ORDER — HEPARIN SOD (PORK) LOCK FLUSH 100 UNIT/ML IV SOLN
500.0000 [IU] | Freq: Once | INTRAVENOUS | Status: AC | PRN
  Administered 2012-08-21: 500 [IU]
  Filled 2012-08-21: qty 5

## 2012-08-21 MED ORDER — SODIUM CHLORIDE 0.9 % IJ SOLN
10.0000 mL | INTRAMUSCULAR | Status: DC | PRN
  Administered 2012-08-21: 10 mL
  Filled 2012-08-21: qty 10

## 2012-08-21 MED ORDER — TOPOTECAN HCL CHEMO INJECTION 4 MG
4.0000 mg/m2 | Freq: Once | INTRAVENOUS | Status: AC
  Administered 2012-08-21: 7 mg via INTRAVENOUS
  Filled 2012-08-21: qty 7

## 2012-08-21 NOTE — Telephone Encounter (Signed)
Per desk RN I have moved ivf appt from tomorrow to WEdnesday.  JMW

## 2012-08-21 NOTE — Progress Notes (Signed)
OFFICE PROGRESS NOTE  CC KNAPP,[name], MD [address] [place] [address] Dr. Emelia Loron Dr. 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  PRIOR THERAPY: #1. S/P left breast lumpectomy in March 2011 after she had a screen detected 1.0 cm ER+, PR+, intermediate grade DCIS. Patient had 3 sentinel biopsied all of them were negative for metastatic disease. Patient underwent radiation therapy between 06/05/2009 through 07/02/2009. She was then begun on tamoxifen 20 mg daily in August 2011.  #2  diagnosis of gynecologic malignancy presenting with abdominal mass and pleural effusions. Patient was recently hospitalized with shortness of breath she was discovered to have malignant pleural effusion she is status post Pleurx catheter is. She also had malignant ascites she's had several paracentesis procedures performed during her hospitalization. During her hospitalization she was seen by gynecologic oncology.  #3 patient began neoadjuvant chemotherapy consisting of Taxol and carboplatinum. Her first cycle was administered during her hospitalization. She has completed aTotal of 6 cycles of Taxol and carboplatinum from 01/07/2012 - 06/23/2012. Overall she tolerated it well except for some fatigue and neuropathies.  #4 patient is  status post laparotomy that revealed significant residual disease. I discussed her for pathology from 05/30/2012  #5  Patient began topotecan/avastin beginning on 07/24/12, here for cycle 2 day 1  CURRENT THERAPY: Proceed with cycle today 1 of topotecan and Avastin today.  INTERVAL HISTORY: [name] [name] 67 y.o. female returns for followup visit prior to her scheduled Avastin and topotecan today.She tells me that over the weekend she's had some shaking. Low grade temperatures to 100.4. She has been tired fatigued she is eating and drinking. She has been receiving Neupogen and her count is  18,000 today. She denies any nausea or vomiting. She is a little bit weak tired and fatigued. [name] had a little bit of night sweats. [name] had aches and pains do to the Neupogen. Remainder of the 10 point review of systems is negative.   UMEDICAL HISTORY: Past Medical History  Diagnosis Date  . Interstitial cystitis     on chronic antibiotics  . Hyperlipidemia   . Frequent UTI     on prophylaxis  . 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 CAD (coronary artery disease)     a. s/p CABG 7/13;   b.  LHC 12/01/11:  pLAD 70%, mLAD 40%, CFX 40-50% prior to takeoff of the OM2, oRCA occluded, mid vessel filled via R->R collaterals and distal vessel filled by L->R collaterals, S-OM1/OM2 (small and diffusely dz) with mid 90% stenosis, 90% at OM1 anastomotic site, continuation of OM2 occluded, S-Dx patent, S-PDA occluded, L-LAD ok, EF 55-65%  => Med Rx rec.  [name] Hx of echocardiogram     Echo 5/13: EF 60-65%, grade 2 diastolic dysfunction  . Hypercholesterolemia   . Pleural effusion 12/28/11    s/p pleurx catheter  . GERD (gastroesophageal reflux disease)   . Arthritis     "just a little; lower back" (12/28/11)  . Gout attack 08/2011    related to "stress post OHS"  . Depression   . Breast cancer     "left"; on Tamoxifen  . S/P thoracentesis 12/28/11    "for pleural effusion" (12/28/2011)  . Ovarian cancer   . Tachycardia   . Neuromuscular disorder     peripheral neuropathy    ALLERGIES:  is allergic to codeine; isosorbide; other; phenothiazines; talwin; yellow jacket venom; and ambien.  MEDICATIONS:  Current Outpatient Prescriptions  Medication Sig Dispense Refill  . aspirin 325 MG tablet Take 325 mg by mouth at bedtime.       [name] atorvastatin (LIPITOR) 20 MG tablet Take 20 mg by mouth at bedtime.       . carvedilol (COREG) 25 MG tablet Take 1 tablet (25 mg total) by mouth 2 (two) times daily.  180 tablet  3  . ciprofloxacin (CIPRO) 500 MG tablet Take 1 tablet (500 mg total) by mouth 2 (two) times  daily.  14 tablet  4  . dexamethasone (DECADRON) 4 MG tablet Take 2 tablets (8 mg total) by mouth 2 (two) times daily with a meal. Take daily starting the day after chemotherapy for 2 days. Take with food.  30 tablet  1  . docusate sodium (COLACE) 100 MG capsule Take 200 mg by mouth daily.      [name] gabapentin (NEURONTIN) 100 MG capsule Take 100 mg orally three times a day  90 capsule  6  . HYDROcodone-acetaminophen (NORCO/VICODIN) 5-325 MG per tablet Take 1-2 tablets by mouth every 4 (four) hours as needed.  40 tablet  2  . lidocaine-prilocaine (EMLA) cream Apply topically as needed.  30 g  1  . omeprazole (PRILOSEC) 20 MG capsule Take 1 capsule (20 mg total) by mouth 2 (two) times daily.  60 capsule  12  . oxybutynin (DITROPAN-XL) 10 MG 24 hr tablet Take 10 mg by mouth daily.      Bertram Gala Glycol-Propyl Glycol 0.4-0.3 % SOLN Place 1 drop into both eyes every morning.       . prochlorperazine (COMPAZINE) 10 MG tablet Take 1 tablet (10 mg total) by mouth every 6 (six) hours as needed (Nausea or vomiting).  30 tablet  1  . SF 5000 PLUS 1.1 % CREA dental cream Place 1.1 % onto teeth at bedtime.       [name] venlafaxine XR (EFFEXOR-XR) 75 MG 24 hr capsule Take 75 mg  Orally daily  30 capsule  8  . ALPRAZolam (XANAX) 0.25 MG tablet Take 1 tablet (0.25 mg total) by mouth every 8 (eight) hours as needed for anxiety.  30 tablet  4  . Alum & Mag Hydroxide-Simeth (MAGIC MOUTHWASH) SOLN Take 5 mLs by mouth 4 (four) times daily as needed (mouth sores).      . EPINEPHrine (EPIPEN 2-PAK) 0.3 mg/0.3 mL DEVI Inject 0.3 mg into the muscle daily as needed (allergic reaction).       . fluticasone (FLONASE) 50 MCG/ACT nasal spray Place 2 sprays into the nose daily as needed for allergies.      [name] LORazepam (ATIVAN) 0.5 MG tablet Take 1 tablet (0.5 mg total) by mouth every 6 (six) hours as needed (Nausea or vomiting).  30 tablet  0  . nitroGLYCERIN (NITROSTAT) 0.4 MG SL tablet Place 0.4 mg under the tongue every 5 (five)  minutes as needed. Chest pain      . ondansetron (ZOFRAN) 8 MG tablet Take 1 tablet (8 mg total) by mouth 2 (two) times daily. Take two times a day starting the day after chemo for 2 days. Then take two times a day as needed for nausea or vomiting.  30 tablet  1  . prochlorperazine (COMPAZINE) 25 MG suppository Place 1 suppository (25 mg total) rectally every 12 (twelve) hours as needed for nausea.  12 suppository  3  . trimethoprim (TRIMPEX) 100 MG tablet Take 100 mg by mouth every morning.       [name]  valACYclovir (VALTREX) 500 MG tablet Take 1 tablet (500 mg total) by mouth daily.  7 tablet  4   No current facility-administered medications for this visit.   Facility-Administered Medications Ordered in Other Visits  Medication Dose Route Frequency Provider Last Rate Last Dose  . influenza  inactive virus vaccine (FLUZONE/FLUARIX) injection 0.5 mL  0.5 mL Intramuscular Once Joselyn Arrow, MD        SURGICAL HISTORY:  Past Surgical History  Procedure Laterality Date  . Breast lumpectomy  04/2009    left  . Cesarean section  1973; 1976  . Muscle release  1960    L neck and chest.; "when I was 13; pneumonia settled in my left neck"  . Coronary artery bypass graft  08/18/2011    Procedure: CORONARY ARTERY BYPASS GRAFTING (CABG);  Surgeon: Alleen Borne, MD;  Location: [facility] OR;  Service: Open Heart Surgery;  Laterality: N/A;  Coronary Artery Bypass Graft times five utilizing the left internal mammary artery and the left greater saphenous vein harvested endoscopically.  . Abdominal hysterectomy  1976  . Appendectomy  1976  . Portacath placement  01/06/2012    Procedure: INSERTION PORT-A-CATH;  Surgeon: Alleen Borne, MD;  Location: [facility] OR;  Service: Thoracic;  Laterality: Left;  . Chest tube insertion  01/06/2012    Procedure: INSERTION PLEURAL DRAINAGE CATHETER;  Surgeon: Alleen Borne, MD;  Location: [facility] OR;  Service: Thoracic;  Laterality: Bilateral;  . Removal of pleural drainage catheter Right  04/12/2012    Procedure: MINOR REMOVAL OF PLEURAL DRAINAGE CATHETER;  Surgeon: Alleen Borne, MD;  Location: [facility] OR;  Service: Thoracic;  Laterality: Right;  . Talc pleurodesis Left 04/12/2012    Procedure: Lurlean Nanny;  Surgeon: Alleen Borne, MD;  Location: [facility] OR;  Service: Thoracic;  Laterality: Left;  . Portacath placement Left 04/17/2012    Procedure: INSERTION PORT-A-CATH;  Surgeon: Alleen Borne, MD;  Location: [facility] OR;  Service: Thoracic;  Laterality: Left;  . Removal of pleural drainage catheter Left 04/17/2012    Procedure: REMOVAL OF PLEURAL DRAINAGE CATHETER;  Surgeon: Alleen Borne, MD;  Location: [facility] OR;  Service: Thoracic;  Laterality: Left;  . Port-a-cath removal Left 04/17/2012    Procedure: REMOVAL PORT-A-CATH;  Surgeon: Alleen Borne, MD;  Location: [facility] OR;  Service: Thoracic;  Laterality: Left;  . Cardiac catheterization    . Laparotomy Bilateral 05/30/2012    Procedure: Resection of umbilical mass, Partial omentectomy;  Surgeon: Jeannette Corpus, MD;  Location: [facility] ORS;  Service: Gynecology;  Laterality: Bilateral;    REVIEW OF SYSTEMS:   General: fatigue (+), night sweats (-), fever (-), pain (-) Lymph: palpable nodes (-) HEENT: vision changes (-), mucositis (-), gum bleeding (-), epistaxis (-) Cardiovascular: chest pain (-), palpitations (-) Pulmonary: shortness of breath (-), dyspnea on exertion (-), cough (-), hemoptysis (-) GI:  Early satiety (-), melena (-), dysphagia (-), nausea/vomiting (-), diarrhea (-) GU: dysuria (-), hematuria (-), incontinence (-) Musculoskeletal: joint swelling (-), joint pain (-), back pain (-) Neuro: weakness (-), numbness (+), headache (-), confusion (-) Skin: Rash (-), lesions (-), dryness (-) Psych: depression (-), suicidal/homicidal ideation (-), feeling of hopelessness (-)   PHYSICAL EXAMINATION:  BP 101/66  Pulse 105  Temp(Src) 98.4 F (36.9 C) (Oral)  Resp 20  Ht 5\' 6"  (1.676 m)  Wt 147 lb 12.8 oz (67.042 kg)  BMI  23.87 kg/m2  BP lying 112/74, HR 94, BP sitting 116/75, HR 95, BP standing 115/73,  HR 105 General: Patient is an ill appearing female in no acute distress HEENT: PERRLA, sclerae anicteric no conjunctival pallor, MMM, fluid in tympanic membranes Neck: supple, no palpable adenopathy Lungs: clear to auscultation bilaterally, no wheezes, rhonchi, or rales Cardiovascular: regular rate rhythm, S1, S2, no murmurs, rubs or gallops Abdomen: Soft, non-tender, non-distended, normoactive bowel sounds, no HSM, Extremities: warm and well perfused, no clubbing, cyanosis, or edema Skin: No rashes or lesions Neuro: Non-focal ECOG PERFORMANCE STATUS: 0 - Asymptomatic  LABORATORY DATA: Lab Results  Component Value Date   WBC 18.4* 08/21/2012   HGB 11.0* 08/21/2012   HCT 34.7* 08/21/2012   MCV 89.0 08/21/2012   PLT 342 08/21/2012      Chemistry      Component Value Date/Time   NA 135* 08/14/2012 1403   NA 134* 06/02/2012 0515   NA 141 12/17/2011 1144   K 4.0 08/14/2012 1403   K 4.4 06/02/2012 0515   CL 103 08/07/2012 1108   CL 100 06/02/2012 0515   CO2 25 08/14/2012 1403   CO2 25 06/02/2012 0515   BUN 14.6 08/14/2012 1403   BUN 20 06/02/2012 0515   BUN 15 12/17/2011 1144   CREATININE 0.7 08/14/2012 1403   CREATININE 0.76 06/02/2012 0515      Component Value Date/Time   CALCIUM 9.1 08/14/2012 1403   CALCIUM 8.7 06/02/2012 0515   ALKPHOS 84 08/14/2012 1403   ALKPHOS 85 05/26/2012 0955   AST 15 08/14/2012 1403   AST 22 05/26/2012 0955   ALT 16 08/14/2012 1403   ALT 16 05/26/2012 0955   BILITOT 0.38 08/14/2012 1403   BILITOT 0.3 05/26/2012 0955       RADIOGRAPHIC STUDIES:  No results found.  ASSESSMENT: 67 year old female with:  #1 DCIS of the breast. She is on tamoxifen 20 mg daily. However I have recommended that we discontinue the tamoxifen for now a do to her recent diagnosis of ovarian cancer.  #2 recent diagnosis of gynecologic malignancy this is ovarian carcinoma). Patient is currently receiving  neoadjuvant chemotherapy consisting of Taxol and carboplatinum. Overall she is tolerating it well. Recent CT performed on January 8 showed a minute response to chemotherapy. Total of 6 cycles of Taxol and carboplatinum was given.  #3 patient is now status post laparotomy performed on 05/30/2012. Unfortunately intraoperatively she was found to have significant residual disease. 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 Patient and I discussed the pathology results today. We also discussed further treatment options. This would include systemic chemotherapy. The regimen gemcitabine and carboplatinum with Avastin is a possibility. However I would like to have her recover from her surgery the next few weeks. She is also scheduled to be seen by Dr. Grant Ruts.   #4 patient has had chemotherapy sensitivity testing performed unfortunately the results were inconclusive. We discussed the possibility of doing to topotecan and Avastin. This is discussed with the patient. Dr. Grant Ruts has also recommended this.   #5 patient was started topotecan and avastin  With topotecan q week on day 1, 8,15 on a 28 day cycle, avastin q 3 weeks  #6 depression/anxiety: Currently on Effexor with good response  #7 neutropenia resolved due to Neupogen  #8 dehydration  #9 cold sores/herpes labialis.  PLAN: #1 patient will proceed with her scheduled chemotherapy today consisting of topotecan and Avastin.  #2 she does look dehydrated and I have recommended that we will give her IV fluids  today and tomorrow.  #3 she will continue Valtrex as well as Cipro 500 mg twice a day.  #4 her white count is over 18,000 this is due to the Neupogen. She is also having some sweats and chills this could possibly be due to the Neupogen however an infection cannot be ruled out and I have told her this. But we will proceed with her chemotherapy anyway.  #5 I  will see her back on July 10 for followup.  All questions were answered. The patient knows to call the clinic with any problems, questions or concerns. We can certainly see the patient much sooner if necessary.  I spent 25 minutes counseling the patient face to face. The total time spent in the appointment was 30 minutes.  Drue Second, MD Medical/Oncology [facility] [phone] (beeper) [phone] (Office)  08/21/2012, 9:51 AM  .

## 2012-08-21 NOTE — Patient Instructions (Addendum)
1 proceed with scheduled chemotherapy today consisting of topotecan and Avastin.  #2 IV fluids today and tomorrow.  #3 please call with any problems over the next 2448 hours.  #4 I will see you back on Thursday, 08/24/2012 for followup.

## 2012-08-21 NOTE — Telephone Encounter (Signed)
Per POF and staff phone call I have scheduled appt for 7/10. Pt also needs appt for 7/8, waiting for approval from manger due to high volume that day. Message given back to scheduler and desk RN. JMW

## 2012-08-21 NOTE — Patient Instructions (Signed)
Cancer Center Discharge Instructions for Patients Receiving Chemotherapy  Today you received the following chemotherapy agents Avastin/Topotecan To help prevent nausea and vomiting after your treatment, we encourage you to take your nausea medication as prescribed.   If you develop nausea and vomiting that is not controlled by your nausea medication, call the clinic.   BELOW ARE SYMPTOMS THAT SHOULD BE REPORTED IMMEDIATELY:  *FEVER GREATER THAN 100.5 F  *CHILLS WITH OR WITHOUT FEVER  NAUSEA AND VOMITING THAT IS NOT CONTROLLED WITH YOUR NAUSEA MEDICATION  *UNUSUAL SHORTNESS OF BREATH  *UNUSUAL BRUISING OR BLEEDING  TENDERNESS IN MOUTH AND THROAT WITH OR WITHOUT PRESENCE OF ULCERS  *URINARY PROBLEMS  *BOWEL PROBLEMS  UNUSUAL RASH Items with * indicate a potential emergency and should be followed up as soon as possible.  Feel free to call the clinic you have any questions or concerns. The clinic phone number is [phone].    Dehydration, Adult Dehydration is when you lose more fluids from the body than you take in. Vital organs like the kidneys, brain, and heart cannot function without a proper amount of fluids and salt. Any loss of fluids from the body can cause dehydration.  CAUSES   Vomiting.  Diarrhea.  Excessive sweating.  Excessive urine output.  Fever. SYMPTOMS  Mild dehydration  Thirst.  Dry lips.  Slightly dry mouth. Moderate dehydration  Very dry mouth.  Sunken eyes.  Skin does not bounce back quickly when lightly pinched and released.  Dark urine and decreased urine production.  Decreased tear production.  Headache. Severe dehydration  Very dry mouth.  Extreme thirst.  Rapid, weak pulse (more than 100 beats per minute at rest).  Cold hands and feet.  Not able to sweat in spite of heat and temperature.  Rapid breathing.  Blue lips.  Confusion and lethargy.  Difficulty being awakened.  Minimal urine  production.  No tears. DIAGNOSIS  Your caregiver will diagnose dehydration based on your symptoms and your exam. Blood and urine tests will help confirm the diagnosis. The diagnostic evaluation should also identify the cause of dehydration. TREATMENT  Treatment of mild or moderate dehydration can often be done at home by increasing the amount of fluids that you drink. It is best to drink small amounts of fluid more often. Drinking too much at one time can make vomiting worse. Refer to the home care instructions below. Severe dehydration needs to be treated at the hospital where you will probably be given intravenous (IV) fluids that contain water and electrolytes. HOME CARE INSTRUCTIONS   Ask your caregiver about specific rehydration instructions.  Drink enough fluids to keep your urine clear or pale yellow.  Drink small amounts frequently if you have nausea and vomiting.  Eat as you normally do.  Avoid:  Foods or drinks high in sugar.  Carbonated drinks.  Juice.  Extremely hot or cold fluids.  Drinks with caffeine.  Fatty, greasy foods.  Alcohol.  Tobacco.  Overeating.  Gelatin desserts.  Wash your hands well to avoid spreading bacteria and viruses.  Only take over-the-counter or prescription medicines for pain, discomfort, or fever as directed by your caregiver.  Ask your caregiver if you should continue all prescribed and over-the-counter medicines.  Keep all follow-up appointments with your caregiver. SEEK MEDICAL CARE IF:  You have abdominal pain and it increases or stays in one area (localizes).  You have a rash, stiff neck, or severe headache.  You are irritable, sleepy, or difficult to awaken.  You  are weak, dizzy, or extremely thirsty. SEEK IMMEDIATE MEDICAL CARE IF:   You are unable to keep fluids down or you get worse despite treatment.  You have frequent episodes of vomiting or diarrhea.  You have blood or green matter (bile) in your  vomit.  You have blood in your stool or your stool looks black and tarry.  You have not urinated in 6 to 8 hours, or you have only urinated a small amount of very dark urine.  You have a fever.  You faint. MAKE SURE YOU:   Understand these instructions.  Will watch your condition.  Will get help right away if you are not doing well or get worse. Document Released: 02/01/2005 Document Revised: 04/26/2011 Document Reviewed: 09/21/2010 [facility] Patient Information 2014 [place], [place].

## 2012-08-21 NOTE — Telephone Encounter (Signed)
, °

## 2012-08-23 ENCOUNTER — Ambulatory Visit (HOSPITAL_BASED_OUTPATIENT_CLINIC_OR_DEPARTMENT_OTHER): Payer: BC Managed Care – PPO

## 2012-08-23 VITALS — BP 140/78 | HR 64 | Temp 98.0°F | Resp 18

## 2012-08-23 DIAGNOSIS — E860 Dehydration: Secondary | ICD-10-CM

## 2012-08-23 MED ORDER — HEPARIN SOD (PORK) LOCK FLUSH 100 UNIT/ML IV SOLN
500.0000 [IU] | Freq: Once | INTRAVENOUS | Status: AC
  Administered 2012-08-23: 500 [IU] via INTRAVENOUS
  Filled 2012-08-23: qty 5

## 2012-08-23 MED ORDER — SODIUM CHLORIDE 0.9 % IJ SOLN
10.0000 mL | INTRAMUSCULAR | Status: DC | PRN
  Administered 2012-08-23: 10 mL via INTRAVENOUS
  Filled 2012-08-23: qty 10

## 2012-08-23 MED ORDER — SODIUM CHLORIDE 0.9 % IV SOLN
Freq: Once | INTRAVENOUS | Status: AC
  Administered 2012-08-23: 13:00:00 via INTRAVENOUS

## 2012-08-24 ENCOUNTER — Ambulatory Visit (HOSPITAL_BASED_OUTPATIENT_CLINIC_OR_DEPARTMENT_OTHER): Payer: BC Managed Care – PPO | Admitting: Oncology

## 2012-08-24 ENCOUNTER — Encounter: Payer: Self-pay | Admitting: Oncology

## 2012-08-24 ENCOUNTER — Ambulatory Visit (HOSPITAL_BASED_OUTPATIENT_CLINIC_OR_DEPARTMENT_OTHER): Payer: BC Managed Care – PPO

## 2012-08-24 VITALS — BP 142/85 | HR 62 | Temp 97.2°F

## 2012-08-24 VITALS — BP 126/72 | HR 71 | Temp 97.7°F | Resp 20 | Ht 66.0 in | Wt 145.9 lb

## 2012-08-24 DIAGNOSIS — E860 Dehydration: Secondary | ICD-10-CM

## 2012-08-24 MED ORDER — SODIUM CHLORIDE 0.9 % IV SOLN
Freq: Once | INTRAVENOUS | Status: AC
  Administered 2012-08-24: 12:00:00 via INTRAVENOUS

## 2012-08-24 MED ORDER — HEPARIN SOD (PORK) LOCK FLUSH 100 UNIT/ML IV SOLN
500.0000 [IU] | Freq: Once | INTRAVENOUS | Status: AC
  Administered 2012-08-24: 500 [IU] via INTRAVENOUS
  Filled 2012-08-24: qty 5

## 2012-08-24 MED ORDER — SODIUM CHLORIDE 0.9 % IJ SOLN
10.0000 mL | INTRAMUSCULAR | Status: DC | PRN
  Administered 2012-08-24: 10 mL via INTRAVENOUS
  Filled 2012-08-24: qty 10

## 2012-08-24 NOTE — Patient Instructions (Addendum)
Dehydration, Adult Dehydration is when you lose more fluids from the body than you take in. Vital organs like the kidneys, brain, and heart cannot function without a proper amount of fluids and salt. Any loss of fluids from the body can cause dehydration.  CAUSES   Vomiting.  Diarrhea.  Excessive sweating.  Excessive urine output.  Fever. SYMPTOMS  Mild dehydration  Thirst.  Dry lips.  Slightly dry mouth. Moderate dehydration  Very dry mouth.  Sunken eyes.  Skin does not bounce back quickly when lightly pinched and released.  Dark urine and decreased urine production.  Decreased tear production.  Headache. Severe dehydration  Very dry mouth.  Extreme thirst.  Rapid, weak pulse (more than 100 beats per minute at rest).  Cold hands and feet.  Not able to sweat in spite of heat and temperature.  Rapid breathing.  Blue lips.  Confusion and lethargy.  Difficulty being awakened.  Minimal urine production.  No tears. DIAGNOSIS  Your caregiver will diagnose dehydration based on your symptoms and your exam. Blood and urine tests will help confirm the diagnosis. The diagnostic evaluation should also identify the cause of dehydration. TREATMENT  Treatment of mild or moderate dehydration can often be done at home by increasing the amount of fluids that you drink. It is best to drink small amounts of fluid more often. Drinking too much at one time can make vomiting worse. Refer to the home care instructions below. Severe dehydration needs to be treated at the hospital where you will probably be given intravenous (IV) fluids that contain water and electrolytes. HOME CARE INSTRUCTIONS   Ask your caregiver about specific rehydration instructions.  Drink enough fluids to keep your urine clear or pale yellow.  Drink small amounts frequently if you have nausea and vomiting.  Eat as you normally do.  Avoid:  Foods or drinks high in sugar.  Carbonated  drinks.  Juice.  Extremely hot or cold fluids.  Drinks with caffeine.  Fatty, greasy foods.  Alcohol.  Tobacco.  Overeating.  Gelatin desserts.  Wash your hands well to avoid spreading bacteria and viruses.  Only take over-the-counter or prescription medicines for pain, discomfort, or fever as directed by your caregiver.  Ask your caregiver if you should continue all prescribed and over-the-counter medicines.  Keep all follow-up appointments with your caregiver. SEEK MEDICAL CARE IF:  You have abdominal pain and it increases or stays in one area (localizes).  You have a rash, stiff neck, or severe headache.  You are irritable, sleepy, or difficult to awaken.  You are weak, dizzy, or extremely thirsty. SEEK IMMEDIATE MEDICAL CARE IF:   You are unable to keep fluids down or you get worse despite treatment.  You have frequent episodes of vomiting or diarrhea.  You have blood or green matter (bile) in your vomit.  You have blood in your stool or your stool looks black and tarry.  You have not urinated in 6 to 8 hours, or you have only urinated a small amount of very dark urine.  You have a fever.  You faint. MAKE SURE YOU:   Understand these instructions.  Will watch your condition.  Will get help right away if you are not doing well or get worse. Document Released: 02/01/2005 Document Revised: 04/26/2011 Document Reviewed: 09/21/2010 [employer] Patient Information 2014 [employer].  

## 2012-08-27 NOTE — Progress Notes (Signed)
OFFICE PROGRESS NOTE  CC [name],[name] A, MD [address] [place] [address] Dr. Emelia Loron Dr. 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  PRIOR THERAPY: #1. S/P left breast lumpectomy in March 2011 after she had a screen detected 1.0 cm ER+, PR+, intermediate grade DCIS. Patient had 3 sentinel biopsied all of them were negative for metastatic disease. Patient underwent radiation therapy between 06/05/2009 through 07/02/2009. She was then begun on tamoxifen 20 mg daily in August 2011.  #2  diagnosis of gynecologic malignancy presenting with abdominal mass and pleural effusions. Patient was recently hospitalized with shortness of breath she was discovered to have malignant pleural effusion she is status post Pleurx catheter is. She also had malignant ascites she's had several paracentesis procedures performed during her hospitalization. During her hospitalization she was seen by gynecologic oncology.  #3 patient began neoadjuvant chemotherapy consisting of Taxol and carboplatinum. Her first cycle was administered during her hospitalization. She has completed aTotal of 6 cycles of Taxol and carboplatinum from 01/07/2012 - 06/23/2012. Overall she tolerated it well except for some fatigue and neuropathies.  #4 patient is  status post laparotomy that revealed significant residual disease. I discussed her for pathology from 05/30/2012  #5  Patient began topotecan/avastin beginning on 07/24/12, here for cycle 2 day 1  CURRENT THERAPY: Proceed with cycle today 1 of topotecan and Avastin today.  INTERVAL HISTORY: [name] [name] 67 y.o. female returns for followup visit prior to her scheduled Avastin and topotecan today.She tells me that over the weekend she's had some shaking. Low grade temperatures to 100.4. She has been tired fatigued she is eating and drinking. She has been receiving Neupogen and her count is  18,000 today. She denies any nausea or vomiting. She is a little bit weak tired and fatigued. [name] had a little bit of night sweats. [name] had aches and pains do to the Neupogen. Remainder of the 10 point review of systems is negative.   UMEDICAL HISTORY: Past Medical History  Diagnosis Date  . Interstitial cystitis     on chronic antibiotics  . Hyperlipidemia   . Frequent UTI     on prophylaxis  . 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 CAD (coronary artery disease)     a. s/p CABG 7/13;   b.  LHC 12/01/11:  pLAD 70%, mLAD 40%, CFX 40-50% prior to takeoff of the OM2, oRCA occluded, mid vessel filled via R->R collaterals and distal vessel filled by L->R collaterals, S-OM1/OM2 (small and diffusely dz) with mid 90% stenosis, 90% at OM1 anastomotic site, continuation of OM2 occluded, S-Dx patent, S-PDA occluded, L-LAD ok, EF 55-65%  => Med Rx rec.  [name] Hx of echocardiogram     Echo 5/13: EF 60-65%, grade 2 diastolic dysfunction  . Hypercholesterolemia   . Pleural effusion 12/28/11    s/p pleurx catheter  . GERD (gastroesophageal reflux disease)   . Arthritis     "just a little; lower back" (12/28/11)  . Gout attack 08/2011    related to "stress post OHS"  . Depression   . Breast cancer     "left"; on Tamoxifen  . S/P thoracentesis 12/28/11    "for pleural effusion" (12/28/2011)  . Ovarian cancer   . Tachycardia   . Neuromuscular disorder     peripheral neuropathy    ALLERGIES:  is allergic to codeine; isosorbide; other; phenothiazines; talwin; yellow jacket venom; and ambien.  MEDICATIONS:  Current Outpatient Prescriptions  Medication Sig Dispense Refill  . ALPRAZolam (XANAX) 0.25 MG tablet Take 1 tablet (0.25 mg total) by mouth every 8 (eight) hours as needed for anxiety.  30 tablet  4  . Alum & Mag Hydroxide-Simeth (MAGIC MOUTHWASH) SOLN Take 5 mLs by mouth 4 (four) times daily as needed (mouth sores).      [name] aspirin 325 MG tablet Take 325 mg by mouth at bedtime.       [name] atorvastatin  (LIPITOR) 20 MG tablet Take 20 mg by mouth at bedtime.       . carvedilol (COREG) 25 MG tablet Take 1 tablet (25 mg total) by mouth 2 (two) times daily.  180 tablet  3  . ciprofloxacin (CIPRO) 500 MG tablet Take 1 tablet (500 mg total) by mouth 2 (two) times daily.  14 tablet  4  . dexamethasone (DECADRON) 4 MG tablet Take 2 tablets (8 mg total) by mouth 2 (two) times daily with a meal. Take daily starting the day after chemotherapy for 2 days. Take with food.  30 tablet  1  . docusate sodium (COLACE) 100 MG capsule Take 200 mg by mouth daily.      [name] EPINEPHrine (EPIPEN 2-PAK) 0.3 mg/0.3 mL DEVI Inject 0.3 mg into the muscle daily as needed (allergic reaction).       . fluticasone (FLONASE) 50 MCG/ACT nasal spray Place 2 sprays into the nose daily as needed for allergies.      [name] gabapentin (NEURONTIN) 100 MG capsule Take 100 mg orally three times a day  90 capsule  6  . HYDROcodone-acetaminophen (NORCO/VICODIN) 5-325 MG per tablet Take 1-2 tablets by mouth every 4 (four) hours as needed.  40 tablet  2  . lidocaine-prilocaine (EMLA) cream Apply topically as needed.  30 g  1  . LORazepam (ATIVAN) 0.5 MG tablet Take 1 tablet (0.5 mg total) by mouth every 6 (six) hours as needed (Nausea or vomiting).  30 tablet  0  . nitroGLYCERIN (NITROSTAT) 0.4 MG SL tablet Place 0.4 mg under the tongue every 5 (five) minutes as needed. Chest pain      . omeprazole (PRILOSEC) 20 MG capsule Take 1 capsule (20 mg total) by mouth 2 (two) times daily.  60 capsule  12  . ondansetron (ZOFRAN) 8 MG tablet Take 1 tablet (8 mg total) by mouth 2 (two) times daily. Take two times a day starting the day after chemo for 2 days. Then take two times a day as needed for nausea or vomiting.  30 tablet  1  . oxybutynin (DITROPAN-XL) 10 MG 24 hr tablet Take 10 mg by mouth daily.      Bertram Gala Glycol-Propyl Glycol 0.4-0.3 % SOLN Place 1 drop into both eyes every morning.       . prochlorperazine (COMPAZINE) 10 MG tablet Take 1 tablet  (10 mg total) by mouth every 6 (six) hours as needed (Nausea or vomiting).  30 tablet  1  . prochlorperazine (COMPAZINE) 25 MG suppository Place 1 suppository (25 mg total) rectally every 12 (twelve) hours as needed for nausea.  12 suppository  3  . SF 5000 PLUS 1.1 % CREA dental cream Place 1.1 % onto teeth at bedtime.       [name] trimethoprim (TRIMPEX) 100 MG tablet Take 100 mg by mouth every morning.       . valACYclovir (VALTREX) 500 MG tablet Take 1 tablet (500 mg total) by mouth daily.  7 tablet  4  .  venlafaxine XR (EFFEXOR-XR) 75 MG 24 hr capsule Take 75 mg  Orally daily  30 capsule  8   No current facility-administered medications for this visit.   Facility-Administered Medications Ordered in Other Visits  Medication Dose Route Frequency Provider Last Rate Last Dose  . influenza  inactive virus vaccine (FLUZONE/FLUARIX) injection 0.5 mL  0.5 mL Intramuscular Once Joselyn Arrow, MD        SURGICAL HISTORY:  Past Surgical History  Procedure Laterality Date  . Breast lumpectomy  04/2009    left  . Cesarean section  1973; 1976  . Muscle release  1960    L neck and chest.; "when I was 13; pneumonia settled in my left neck"  . Coronary artery bypass graft  08/18/2011    Procedure: CORONARY ARTERY BYPASS GRAFTING (CABG);  Surgeon: Alleen Borne, MD;  Location: [facility];  Service: Open Heart Surgery;  Laterality: N/A;  Coronary Artery Bypass Graft times five utilizing the left internal mammary artery and the left greater saphenous vein harvested endoscopically.  . Abdominal hysterectomy  1976  . Appendectomy  1976  . Portacath placement  01/06/2012    Procedure: INSERTION PORT-A-CATH;  Surgeon: Alleen Borne, MD;  Location[facility] OR;  Service: Thoracic;  Laterality: Left;  . Chest tube insertion  01/06/2012    Procedure: INSERTION PLEURAL DRAINAGE CATHETER;  Surgeon: Alleen Borne, MD;  Location: [facility] OR;  Service: Thoracic;  Laterality: Bilateral;  . Removal of pleural drainage catheter Right  04/12/2012    Procedure: MINOR REMOVAL OF PLEURAL DRAINAGE CATHETER;  Surgeon: Alleen Borne, MD;  Location: [facility] OR;  Service: Thoracic;  Laterality: Right;  . Talc pleurodesis Left 04/12/2012    Procedure: Lurlean Nanny;  Surgeon: Alleen Borne, MD;  Location: [facility] OR;  Service: Thoracic;  Laterality: Left;  . Portacath placement Left 04/17/2012    Procedure: INSERTION PORT-A-CATH;  Surgeon: Alleen Borne, MD;  Location: [facility] OR;  Service: Thoracic;  Laterality: Left;  . Removal of pleural drainage catheter Left 04/17/2012    Procedure: REMOVAL OF PLEURAL DRAINAGE CATHETER;  Surgeon: Alleen Borne, MD;  Location: [facility] OR;  Service: Thoracic;  Laterality: Left;  . Port-a-cath removal Left 04/17/2012    Procedure: REMOVAL PORT-A-CATH;  Surgeon: Alleen Borne, MD;  Location: [facility] OR;  Service: Thoracic;  Laterality: Left;  . Cardiac catheterization    . Laparotomy Bilateral 05/30/2012    Procedure: Resection of umbilical mass, Partial omentectomy;  Surgeon: Jeannette Corpus, MD;  Location: [facility] ORS;  Service: Gynecology;  Laterality: Bilateral;    REVIEW OF SYSTEMS:   General: fatigue (+), night sweats (-), fever (-), pain (-) Lymph: palpable nodes (-) HEENT: vision changes (-), mucositis (-), gum bleeding (-), epistaxis (-) Cardiovascular: chest pain (-), palpitations (-) Pulmonary: shortness of breath (-), dyspnea on exertion (-), cough (-), hemoptysis (-) GI:  Early satiety (-), melena (-), dysphagia (-), nausea/vomiting (-), diarrhea (-) GU: dysuria (-), hematuria (-), incontinence (-) Musculoskeletal: joint swelling (-), joint pain (-), back pain (-) Neuro: weakness (-), numbness (+), headache (-), confusion (-) Skin: Rash (-), lesions (-), dryness (-) Psych: depression (-), suicidal/homicidal ideation (-), feeling of hopelessness (-)   PHYSICAL EXAMINATION:  BP 126/72  Pulse 71  Temp(Src) 97.7 F (36.5 C) (Oral)  Resp 20  Ht 5\' 6"  (1.676 m)  Wt 145 lb 14.4 oz (66.18 kg)  BMI 23.56  kg/m2  BP lying 112/74, HR 94, BP sitting 116/75, HR 95, BP standing 115/73,  HR 105 General: Patient is an ill appearing female in no acute distress HEENT: PERRLA, sclerae anicteric no conjunctival pallor, MMM, fluid in tympanic membranes Neck: supple, no palpable adenopathy Lungs: clear to auscultation bilaterally, no wheezes, rhonchi, or rales Cardiovascular: regular rate rhythm, S1, S2, no murmurs, rubs or gallops Abdomen: Soft, non-tender, non-distended, normoactive bowel sounds, no HSM, Extremities: warm and well perfused, no clubbing, cyanosis, or edema Skin: No rashes or lesions Neuro: Non-focal ECOG PERFORMANCE STATUS: 0 - Asymptomatic  LABORATORY DATA: Lab Results  Component Value Date   WBC 18.4* 08/21/2012   HGB 11.0* 08/21/2012   HCT 34.7* 08/21/2012   MCV 89.0 08/21/2012   PLT 342 08/21/2012      Chemistry      Component Value Date/Time   NA 132* 08/21/2012 0917   NA 134* 06/02/2012 0515   NA 141 12/17/2011 1144   K 4.1 08/21/2012 0917   K 4.4 06/02/2012 0515   CL 103 08/07/2012 1108   CL 100 06/02/2012 0515   CO2 24 08/21/2012 0917   CO2 25 06/02/2012 0515   BUN 11.9 08/21/2012 0917   BUN 20 06/02/2012 0515   BUN 15 12/17/2011 1144   CREATININE 0.8 08/21/2012 0917   CREATININE 0.76 06/02/2012 0515      Component Value Date/Time   CALCIUM 9.2 08/21/2012 0917   CALCIUM 8.7 06/02/2012 0515   ALKPHOS 101 08/21/2012 0917   ALKPHOS 85 05/26/2012 0955   AST 28 08/21/2012 0917   AST 22 05/26/2012 0955   ALT 24 08/21/2012 0917   ALT 16 05/26/2012 0955   BILITOT 0.34 08/21/2012 0917   BILITOT 0.3 05/26/2012 0955       RADIOGRAPHIC STUDIES:  No results found.  ASSESSMENT: 67 year old female with:  #1 DCIS of the breast. She is on tamoxifen 20 mg daily. However I have recommended that we discontinue the tamoxifen for now a do to her recent diagnosis of ovarian cancer.  #2 recent diagnosis of gynecologic malignancy this is ovarian carcinoma). Patient is currently receiving neoadjuvant  chemotherapy consisting of Taxol and carboplatinum. Overall she is tolerating it well. Recent CT performed on January 8 showed a minute response to chemotherapy. Total of 6 cycles of Taxol and carboplatinum was given.  #3 patient is now status post laparotomy performed on 05/30/2012. Unfortunately intraoperatively she was found to have significant residual disease. 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 Patient and I discussed the pathology results today. We also discussed further treatment options. This would include systemic chemotherapy. The regimen gemcitabine and carboplatinum with Avastin is a possibility. However I would like to have her recover from her surgery the next few weeks. She is also scheduled to be seen by Dr. Grant Ruts.   #4 patient has had chemotherapy sensitivity testing performed unfortunately the results were inconclusive. We discussed the possibility of doing to topotecan and Avastin. This is discussed with the patient. Dr. Grant Ruts has also recommended this.   #5 patient was started topotecan and avastin  With topotecan q week on day 1, 8,15 on a 28 day cycle, avastin q 3 weeks  #6 depression/anxiety: Currently on Effexor with good response  #7 neutropenia resolved due to Neupogen  #8 dehydration  #9 cold sores/herpes labialis.  PLAN: #1proceed with IV fluids today.  #2 she will return in one week's time for followup and chemotherapy  All questions were answered. The patient knows to call the clinic with  any problems, questions or concerns. We can certainly see the patient much sooner if necessary.  I spent 25 minutes counseling the patient face to face. The total time spent in the appointment was 30 minutes.  Drue Second, MD Medical/Oncology [facility] [phone] (beeper) [phone] (Office)   .

## 2012-08-28 ENCOUNTER — Ambulatory Visit (HOSPITAL_BASED_OUTPATIENT_CLINIC_OR_DEPARTMENT_OTHER): Payer: BC Managed Care – PPO

## 2012-08-28 ENCOUNTER — Ambulatory Visit (HOSPITAL_BASED_OUTPATIENT_CLINIC_OR_DEPARTMENT_OTHER): Payer: BC Managed Care – PPO | Admitting: Oncology

## 2012-08-28 ENCOUNTER — Other Ambulatory Visit (HOSPITAL_BASED_OUTPATIENT_CLINIC_OR_DEPARTMENT_OTHER): Payer: Medicare Other | Admitting: Lab

## 2012-08-28 ENCOUNTER — Encounter: Payer: Self-pay | Admitting: Oncology

## 2012-08-28 VITALS — BP 115/69 | HR 70 | Temp 98.7°F | Resp 18

## 2012-08-28 DIAGNOSIS — E860 Dehydration: Secondary | ICD-10-CM

## 2012-08-28 DIAGNOSIS — C569 Malignant neoplasm of unspecified ovary: Secondary | ICD-10-CM

## 2012-08-28 DIAGNOSIS — F329 Major depressive disorder, single episode, unspecified: Secondary | ICD-10-CM

## 2012-08-28 DIAGNOSIS — Z5111 Encounter for antineoplastic chemotherapy: Secondary | ICD-10-CM

## 2012-08-28 DIAGNOSIS — C50919 Malignant neoplasm of unspecified site of unspecified female breast: Secondary | ICD-10-CM

## 2012-08-28 DIAGNOSIS — D0590 Unspecified type of carcinoma in situ of unspecified breast: Secondary | ICD-10-CM

## 2012-08-28 DIAGNOSIS — F3289 Other specified depressive episodes: Secondary | ICD-10-CM

## 2012-08-28 DIAGNOSIS — R0602 Shortness of breath: Secondary | ICD-10-CM

## 2012-08-28 DIAGNOSIS — B009 Herpesviral infection, unspecified: Secondary | ICD-10-CM

## 2012-08-28 LAB — CBC WITH DIFFERENTIAL/PLATELET
Basophils Absolute: 0 10*3/uL (ref 0.0–0.1)
EOS%: 1.5 % (ref 0.0–7.0)
HCT: 33.8 % — ABNORMAL LOW (ref 34.8–46.6)
HGB: 11 g/dL — ABNORMAL LOW (ref 11.6–15.9)
LYMPH%: 12.4 % — ABNORMAL LOW (ref 14.0–49.7)
MCH: 28.1 pg (ref 25.1–34.0)
MCV: 86.4 fL (ref 79.5–101.0)
NEUT%: 73.3 % (ref 38.4–76.8)
Platelets: 334 10*3/uL (ref 145–400)
lymph#: 0.7 10*3/uL — ABNORMAL LOW (ref 0.9–3.3)

## 2012-08-28 LAB — COMPREHENSIVE METABOLIC PANEL (CC13)
Albumin: 2.8 g/dL — ABNORMAL LOW (ref 3.5–5.0)
Alkaline Phosphatase: 85 U/L (ref 40–150)
BUN: 18.5 mg/dL (ref 7.0–26.0)
Calcium: 9.3 mg/dL (ref 8.4–10.4)
Creatinine: 0.8 mg/dL (ref 0.6–1.1)
Glucose: 126 mg/dl (ref 70–140)
Potassium: 3.8 mEq/L (ref 3.5–5.1)

## 2012-08-28 MED ORDER — SODIUM CHLORIDE 0.9 % IJ SOLN
10.0000 mL | INTRAMUSCULAR | Status: DC | PRN
  Administered 2012-08-28: 10 mL
  Filled 2012-08-28: qty 10

## 2012-08-28 MED ORDER — TOPOTECAN HCL CHEMO INJECTION 4 MG
4.0000 mg/m2 | Freq: Once | INTRAVENOUS | Status: AC
  Administered 2012-08-28: 7 mg via INTRAVENOUS
  Filled 2012-08-28: qty 7

## 2012-08-28 MED ORDER — SODIUM CHLORIDE 0.9 % IV SOLN
Freq: Once | INTRAVENOUS | Status: AC
  Administered 2012-08-28: 15:00:00 via INTRAVENOUS

## 2012-08-28 MED ORDER — ONDANSETRON 8 MG/50ML IVPB (CHCC)
8.0000 mg | Freq: Once | INTRAVENOUS | Status: AC
  Administered 2012-08-28: 8 mg via INTRAVENOUS

## 2012-08-28 MED ORDER — HEPARIN SOD (PORK) LOCK FLUSH 100 UNIT/ML IV SOLN
500.0000 [IU] | Freq: Once | INTRAVENOUS | Status: AC | PRN
  Administered 2012-08-28: 500 [IU]
  Filled 2012-08-28: qty 5

## 2012-08-28 MED ORDER — DEXAMETHASONE SODIUM PHOSPHATE 10 MG/ML IJ SOLN
10.0000 mg | Freq: Once | INTRAMUSCULAR | Status: AC
  Administered 2012-08-28: 10 mg via INTRAVENOUS

## 2012-08-28 NOTE — Patient Instructions (Addendum)
[facility] Discharge Instructions for Patients Receiving Chemotherapy  Today you received the following chemotherapy agents Topotecan.  To help prevent nausea and vomiting after your treatment, we encourage you to take your nausea medication as prescribed.   If you develop nausea and vomiting that is not controlled by your nausea medication, call the clinic.   BELOW ARE SYMPTOMS THAT SHOULD BE REPORTED IMMEDIATELY:  *FEVER GREATER THAN 100.5 F  *CHILLS WITH OR WITHOUT FEVER  NAUSEA AND VOMITING THAT IS NOT CONTROLLED WITH YOUR NAUSEA MEDICATION  *UNUSUAL SHORTNESS OF BREATH  *UNUSUAL BRUISING OR BLEEDING  TENDERNESS IN MOUTH AND THROAT WITH OR WITHOUT PRESENCE OF ULCERS  *URINARY PROBLEMS  *BOWEL PROBLEMS  UNUSUAL RASH Items with * indicate a potential emergency and should be followed up as soon as possible.  Feel free to call the clinic you have any questions or concerns. The clinic phone number is [phone].    

## 2012-08-28 NOTE — Patient Instructions (Addendum)
#  1 proceed with topotecan today.  #2 shortness of breath: We will obtain a chest x-ray.  #3 continue to hydrate yourself aggressively.  #4 I will see you back in one week's time for followup.

## 2012-08-29 ENCOUNTER — Telehealth: Payer: Self-pay | Admitting: *Deleted

## 2012-08-29 NOTE — Telephone Encounter (Signed)
sw pt made her aware that she needs an cxr. Pt plans on coming in 08/30/12 to get one completed...td

## 2012-08-30 ENCOUNTER — Ambulatory Visit: Payer: BC Managed Care – PPO | Admitting: Oncology

## 2012-08-30 ENCOUNTER — Ambulatory Visit (HOSPITAL_COMMUNITY)
Admission: RE | Admit: 2012-08-30 | Discharge: 2012-08-30 | Disposition: A | Discharge status: Home / Self Care | Payer: Medicare Other | Source: Ambulatory Visit | Attending: Oncology | Admitting: Oncology

## 2012-08-30 DIAGNOSIS — J90 Pleural effusion, not elsewhere classified: Secondary | ICD-10-CM | POA: Insufficient documentation

## 2012-08-30 DIAGNOSIS — R0602 Shortness of breath: Secondary | ICD-10-CM

## 2012-08-30 NOTE — Progress Notes (Signed)
Quick Note:  Please call patient:chest x-ray no reaccumulation of fluid in the lungs ______

## 2012-08-31 ENCOUNTER — Other Ambulatory Visit: Payer: Self-pay | Admitting: Cardiovascular Disease

## 2012-09-01 ENCOUNTER — Other Ambulatory Visit: Payer: Self-pay | Admitting: Cardiovascular Disease

## 2012-09-01 NOTE — Telephone Encounter (Signed)
Fax Received. Refill Completed. [name] [name] (R.M.A)   

## 2012-09-04 ENCOUNTER — Ambulatory Visit (HOSPITAL_BASED_OUTPATIENT_CLINIC_OR_DEPARTMENT_OTHER): Payer: Medicare Other | Admitting: Oncology

## 2012-09-04 ENCOUNTER — Encounter: Payer: Self-pay | Admitting: Oncology

## 2012-09-04 ENCOUNTER — Telehealth: Payer: Self-pay | Admitting: *Deleted

## 2012-09-04 ENCOUNTER — Other Ambulatory Visit (HOSPITAL_BASED_OUTPATIENT_CLINIC_OR_DEPARTMENT_OTHER): Payer: BC Managed Care – PPO | Admitting: Lab

## 2012-09-04 ENCOUNTER — Ambulatory Visit (HOSPITAL_BASED_OUTPATIENT_CLINIC_OR_DEPARTMENT_OTHER): Payer: BC Managed Care – PPO

## 2012-09-04 VITALS — BP 106/66 | HR 83 | Temp 98.2°F | Resp 20 | Ht 66.0 in | Wt 143.3 lb

## 2012-09-04 DIAGNOSIS — K1379 Other lesions of oral mucosa: Secondary | ICD-10-CM

## 2012-09-04 DIAGNOSIS — C786 Secondary malignant neoplasm of retroperitoneum and peritoneum: Secondary | ICD-10-CM

## 2012-09-04 DIAGNOSIS — C569 Malignant neoplasm of unspecified ovary: Secondary | ICD-10-CM

## 2012-09-04 DIAGNOSIS — Z5111 Encounter for antineoplastic chemotherapy: Secondary | ICD-10-CM

## 2012-09-04 DIAGNOSIS — R188 Other ascites: Secondary | ICD-10-CM

## 2012-09-04 DIAGNOSIS — D0590 Unspecified type of carcinoma in situ of unspecified breast: Secondary | ICD-10-CM

## 2012-09-04 DIAGNOSIS — C801 Malignant (primary) neoplasm, unspecified: Secondary | ICD-10-CM

## 2012-09-04 DIAGNOSIS — E860 Dehydration: Secondary | ICD-10-CM

## 2012-09-04 LAB — CBC WITH DIFFERENTIAL/PLATELET
Basophils Absolute: 0 10*3/uL (ref 0.0–0.1)
Eosinophils Absolute: 0.1 10*3/uL (ref 0.0–0.5)
HCT: 31.7 % — ABNORMAL LOW (ref 34.8–46.6)
LYMPH%: 11.5 % — ABNORMAL LOW (ref 14.0–49.7)
MCV: 88.1 fL (ref 79.5–101.0)
MONO#: 0.2 10*3/uL (ref 0.1–0.9)
MONO%: 3.8 % (ref 0.0–14.0)
NEUT#: 4.3 10*3/uL (ref 1.5–6.5)
NEUT%: 83.3 % — ABNORMAL HIGH (ref 38.4–76.8)
Platelets: 168 10*3/uL (ref 145–400)
RBC: 3.6 10*6/uL — ABNORMAL LOW (ref 3.70–5.45)
WBC: 5.2 10*3/uL (ref 3.9–10.3)

## 2012-09-04 LAB — COMPREHENSIVE METABOLIC PANEL (CC13)
ALT: 20 U/L (ref 0–55)
BUN: 15.8 mg/dL (ref 7.0–26.0)
CO2: 26 mEq/L (ref 22–29)
Creatinine: 0.7 mg/dL (ref 0.6–1.1)
Total Bilirubin: 0.34 mg/dL (ref 0.20–1.20)

## 2012-09-04 LAB — UA PROTEIN, DIPSTICK - CHCC: Protein, ur: NEGATIVE mg/dL

## 2012-09-04 MED ORDER — TOPOTECAN HCL CHEMO INJECTION 4 MG
4.0000 mg/m2 | Freq: Once | INTRAVENOUS | Status: AC
  Administered 2012-09-04: 7 mg via INTRAVENOUS
  Filled 2012-09-04: qty 7

## 2012-09-04 MED ORDER — DEXAMETHASONE SODIUM PHOSPHATE 10 MG/ML IJ SOLN
10.0000 mg | Freq: Once | INTRAMUSCULAR | Status: AC
  Administered 2012-09-04: 10 mg via INTRAVENOUS

## 2012-09-04 MED ORDER — HEPARIN SOD (PORK) LOCK FLUSH 100 UNIT/ML IV SOLN
500.0000 [IU] | Freq: Once | INTRAVENOUS | Status: AC | PRN
  Administered 2012-09-04: 500 [IU]
  Filled 2012-09-04: qty 5

## 2012-09-04 MED ORDER — SODIUM CHLORIDE 0.9 % IJ SOLN
10.0000 mL | INTRAMUSCULAR | Status: DC | PRN
  Administered 2012-09-04: 10 mL
  Filled 2012-09-04: qty 10

## 2012-09-04 MED ORDER — SODIUM CHLORIDE 0.9 % IV SOLN
Freq: Once | INTRAVENOUS | Status: AC
  Administered 2012-09-04: 11:00:00 via INTRAVENOUS

## 2012-09-04 MED ORDER — VALACYCLOVIR HCL 500 MG PO TABS: 500.0000 mg | ORAL_TABLET | Freq: Every day | ORAL | Status: AC

## 2012-09-04 MED ORDER — ONDANSETRON 8 MG/50ML IVPB (CHCC)
8.0000 mg | Freq: Once | INTRAVENOUS | Status: AC
  Administered 2012-09-04: 8 mg via INTRAVENOUS

## 2012-09-04 NOTE — Telephone Encounter (Signed)
Per staff phone call and POF I have schedueld appts.  JMW  

## 2012-09-04 NOTE — Progress Notes (Signed)
OFFICE PROGRESS NOTE  CC KNAPP,[name], MD [address] [place] [address] Dr. Emelia Loron Dr. 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  PRIOR THERAPY: #1. S/P left breast lumpectomy in March 2011 after she had a screen detected 1.0 cm ER+, PR+, intermediate grade DCIS. Patient had 3 sentinel biopsied all of them were negative for metastatic disease. Patient underwent radiation therapy between 06/05/2009 through 07/02/2009. She was then begun on tamoxifen 20 mg daily in August 2011.  #2  diagnosis of gynecologic malignancy presenting with abdominal mass and pleural effusions. Patient was recently hospitalized with shortness of breath she was discovered to have malignant pleural effusion she is status post Pleurx catheter is. She also had malignant ascites she's had several paracentesis procedures performed during her hospitalization. During her hospitalization she was seen by gynecologic oncology.  #3 patient began neoadjuvant chemotherapy consisting of Taxol and carboplatinum. Her first cycle was administered during her hospitalization. She has completed aTotal of 6 cycles of Taxol and carboplatinum from 01/07/2012 - 06/23/2012. Overall she tolerated it well except for some fatigue and neuropathies.  #4 patient is  status post laparotomy that revealed significant residual disease. I discussed her for pathology from 05/30/2012  #5  Patient began topotecan/avastin beginning on 07/24/12, here for cycle 2 day 15  CURRENT THERAPY: Proceed with cycle today 15 of topotecan  today.  INTERVAL HISTORY: [name] [name] 67 y.o. female returns for followup visit prior to her scheduledtopotecan today. Overall she is doing well. She does have some mild sores. She otherwise denies any fevers chills night sweats headaches she does have some shortness of breath on exertion. She had a chest x-ray performed which was  essentially stable. She has no nausea or vomiting no fevers chills or night sweats. No aches or pains. Her neuropathy is improved and she has been on gabapentin. She will continue this. Remainder of the 10 point review of systems is negative.   MEDICAL HISTORY: Past Medical History  Diagnosis Date  . Interstitial cystitis     on chronic antibiotics  . Hyperlipidemia   . Frequent UTI     on prophylaxis  . 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 CAD (coronary artery disease)     a. s/p CABG 7/13;   b.  LHC 12/01/11:  pLAD 70%, mLAD 40%, CFX 40-50% prior to takeoff of the OM2, oRCA occluded, mid vessel filled via R->R collaterals and distal vessel filled by L->R collaterals, S-OM1/OM2 (small and diffusely dz) with mid 90% stenosis, 90% at OM1 anastomotic site, continuation of OM2 occluded, S-Dx patent, S-PDA occluded, L-LAD ok, EF 55-65%  => Med Rx rec.  [name] Hx of echocardiogram     Echo 5/13: EF 60-65%, grade 2 diastolic dysfunction  . Hypercholesterolemia   . Pleural effusion 12/28/11    s/p pleurx catheter  . GERD (gastroesophageal reflux disease)   . Arthritis     "just a little; lower back" (12/28/11)  . Gout attack 08/2011    related to "stress post OHS"  . Depression   . Breast cancer     "left"; on Tamoxifen  . S/P thoracentesis 12/28/11    "for pleural effusion" (12/28/2011)  . Ovarian cancer   . Tachycardia   . Neuromuscular disorder     peripheral neuropathy    ALLERGIES:  is allergic to codeine; isosorbide; other; phenothiazines; talwin; yellow jacket venom; and ambien.  MEDICATIONS:  Current Outpatient Prescriptions  Medication Sig Dispense Refill  . Alum & Mag Hydroxide-Simeth (MAGIC MOUTHWASH) SOLN Take 5 mLs by mouth 4 (four) times daily as needed (mouth sores).      [name] aspirin 325 MG tablet Take 325 mg by mouth at bedtime.       [name] atorvastatin (LIPITOR) 20 MG tablet Take 20 mg by mouth at bedtime.       . carvedilol (COREG) 25 MG tablet Take 1 tablet (25 mg total) by mouth 2  (two) times daily.  180 tablet  3  . dexamethasone (DECADRON) 4 MG tablet Take 2 tablets (8 mg total) by mouth 2 (two) times daily with a meal. Take daily starting the day after chemotherapy for 2 days. Take with food.  30 tablet  1  . docusate sodium (COLACE) 100 MG capsule Take 200 mg by mouth daily.      . fluticasone (FLONASE) 50 MCG/ACT nasal spray Place 2 sprays into the nose daily as needed for allergies.      [name] gabapentin (NEURONTIN) 100 MG capsule Take 100 mg orally three times a day  90 capsule  6  . lidocaine-prilocaine (EMLA) cream Apply topically as needed.  30 g  1  . omeprazole (PRILOSEC) 20 MG capsule Take 1 capsule (20 mg total) by mouth 2 (two) times daily.  60 capsule  12  . ondansetron (ZOFRAN) 8 MG tablet Take 1 tablet (8 mg total) by mouth 2 (two) times daily. Take two times a day starting the day after chemo for 2 days. Then take two times a day as needed for nausea or vomiting.  30 tablet  1  . oxybutynin (DITROPAN-XL) 10 MG 24 hr tablet Take 10 mg by mouth daily.      Bertram Gala Glycol-Propyl Glycol 0.4-0.3 % SOLN Place 1 drop into both eyes every morning.       . prochlorperazine (COMPAZINE) 10 MG tablet Take 1 tablet (10 mg total) by mouth every 6 (six) hours as needed (Nausea or vomiting).  30 tablet  1  . SF 5000 PLUS 1.1 % CREA dental cream Place 1.1 % onto teeth at bedtime.       [name] trimethoprim (TRIMPEX) 100 MG tablet Take 100 mg by mouth every morning.       . venlafaxine XR (EFFEXOR-XR) 75 MG 24 hr capsule Take 75 mg  Orally daily  30 capsule  8  . ALPRAZolam (XANAX) 0.25 MG tablet Take 1 tablet (0.25 mg total) by mouth every 8 (eight) hours as needed for anxiety.  30 tablet  4  . atorvastatin (LIPITOR) 20 MG tablet TAKE ONE TABLET BY MOUTH ONCE DAILY  90 tablet  3  . ciprofloxacin (CIPRO) 500 MG tablet Take 1 tablet (500 mg total) by mouth 2 (two) times daily.  14 tablet  4  . EPINEPHrine (EPIPEN 2-PAK) 0.3 mg/0.3 mL DEVI Inject 0.3 mg into the muscle daily as  needed (allergic reaction).       [name] HYDROcodone-acetaminophen (NORCO/VICODIN) 5-325 MG per tablet Take 1-2 tablets by mouth every 4 (four) hours as needed.  40 tablet  2  . LORazepam (ATIVAN) 0.5 MG tablet Take 1 tablet (0.5 mg total) by mouth every 6 (six) hours as needed (Nausea or vomiting).  30 tablet  0  . nitroGLYCERIN (NITROSTAT) 0.4 MG SL tablet Place 0.4 mg under the tongue every 5 (five) minutes as needed. Chest pain      . prochlorperazine (COMPAZINE) 25 MG suppository Place 1 suppository (25 mg total)  rectally every 12 (twelve) hours as needed for nausea.  12 suppository  3  . valACYclovir (VALTREX) 500 MG tablet Take 1 tablet (500 mg total) by mouth daily.  7 tablet  4   No current facility-administered medications for this visit.   Facility-Administered Medications Ordered in Other Visits  Medication Dose Route Frequency Provider Last Rate Last Dose  . influenza  inactive virus vaccine (FLUZONE/FLUARIX) injection 0.5 mL  0.5 mL Intramuscular Once Joselyn Arrow, MD        SURGICAL HISTORY:  Past Surgical History  Procedure Laterality Date  . Breast lumpectomy  04/2009    left  . Cesarean section  1973; 1976  . Muscle release  1960    L neck and chest.; "when I was 13; pneumonia settled in my left neck"  . Coronary artery bypass graft  08/18/2011    Procedure: CORONARY ARTERY BYPASS GRAFTING (CABG);  Surgeon: Alleen Borne, MD;  Location: [facility] OR;  Service: Open Heart Surgery;  Laterality: N/A;  Coronary Artery Bypass Graft times five utilizing the left internal mammary artery and the left greater saphenous vein harvested endoscopically.  . Abdominal hysterectomy  1976  . Appendectomy  1976  . Portacath placement  01/06/2012    Procedure: INSERTION PORT-A-CATH;  Surgeon: Alleen Borne, MD;  Location: [facility] OR;  Service: Thoracic;  Laterality: Left;  . Chest tube insertion  01/06/2012    Procedure: INSERTION PLEURAL DRAINAGE CATHETER;  Surgeon: Alleen Borne, MD;  Location: [facility] OR;   Service: Thoracic;  Laterality: Bilateral;  . Removal of pleural drainage catheter Right 04/12/2012    Procedure: MINOR REMOVAL OF PLEURAL DRAINAGE CATHETER;  Surgeon: Alleen Borne, MD;  Location: [facility] OR;  Service: Thoracic;  Laterality: Right;  . Talc pleurodesis Left 04/12/2012    Procedure: Lurlean Nanny;  Surgeon: Alleen Borne, MD;  Location: [facility] OR;  Service: Thoracic;  Laterality: Left;  . Portacath placement Left 04/17/2012    Procedure: INSERTION PORT-A-CATH;  Surgeon: Alleen Borne, MD;  Location: [facility] OR;  Service: Thoracic;  Laterality: Left;  . Removal of pleural drainage catheter Left 04/17/2012    Procedure: REMOVAL OF PLEURAL DRAINAGE CATHETER;  Surgeon: Alleen Borne, MD;  Location: [facility] OR;  Service: Thoracic;  Laterality: Left;  . Port-a-cath removal Left 04/17/2012    Procedure: REMOVAL PORT-A-CATH;  Surgeon: Alleen Borne, MD;  Location: [facility] OR;  Service: Thoracic;  Laterality: Left;  . Cardiac catheterization    . Laparotomy Bilateral 05/30/2012    Procedure: Resection of umbilical mass, Partial omentectomy;  Surgeon: Jeannette Corpus, MD;  Location: [facility] ORS;  Service: Gynecology;  Laterality: Bilateral;    REVIEW OF SYSTEMS:   General: fatigue (+), night sweats (-), fever (-), pain (-) Lymph: palpable nodes (-) HEENT: vision changes (-), mucositis (-), gum bleeding (-), epistaxis (-) Cardiovascular: chest pain (-), palpitations (-) Pulmonary: shortness of breath (-), dyspnea on exertion (-), cough (-), hemoptysis (-) GI:  Early satiety (-), melena (-), dysphagia (-), nausea/vomiting (-), diarrhea (-) GU: dysuria (-), hematuria (-), incontinence (-) Musculoskeletal: joint swelling (-), joint pain (-), back pain (-) Neuro: weakness (-), numbness (+), headache (-), confusion (-) Skin: Rash (-), lesions (-), dryness (-) Psych: depression (-), suicidal/homicidal ideation (-), feeling of hopelessness (-)   PHYSICAL EXAMINATION:  BP 106/66  Pulse 83  Temp(Src) 98.2 F  (36.8 C) (Oral)  Resp 20  Ht 5\' 6"  (1.676 m)  Wt 143 lb 4.8 oz (65 kg)  BMI  23.14 kg/m2  BP lying 112/74, HR 94, BP sitting 116/75, HR 95, BP standing 115/73, HR 105 General: Patient is an ill appearing female in no acute distress HEENT: PERRLA, sclerae anicteric no conjunctival pallor, MMM, fluid in tympanic membranes Neck: supple, no palpable adenopathy Lungs: clear to auscultation bilaterally, no wheezes, rhonchi, or rales Cardiovascular: regular rate rhythm, S1, S2, no murmurs, rubs or gallops Abdomen: Soft, non-tender, non-distended, normoactive bowel sounds, no HSM, Extremities: warm and well perfused, no clubbing, cyanosis, or edema Skin: No rashes or lesions Neuro: Non-focal ECOG PERFORMANCE STATUS: 0 - Asymptomatic  LABORATORY DATA: Lab Results  Component Value Date   WBC 5.3 08/28/2012   HGB 11.0* 08/28/2012   HCT 33.8* 08/28/2012   MCV 86.4 08/28/2012   PLT 334 08/28/2012      Chemistry      Component Value Date/Time   NA 136 08/28/2012 1208   NA 134* 06/02/2012 0515   NA 141 12/17/2011 1144   K 3.8 08/28/2012 1208   K 4.4 06/02/2012 0515   CL 103 08/07/2012 1108   CL 100 06/02/2012 0515   CO2 24 08/28/2012 1208   CO2 25 06/02/2012 0515   BUN 18.5 08/28/2012 1208   BUN 20 06/02/2012 0515   BUN 15 12/17/2011 1144   CREATININE 0.8 08/28/2012 1208   CREATININE 0.76 06/02/2012 0515      Component Value Date/Time   CALCIUM 9.3 08/28/2012 1208   CALCIUM 8.7 06/02/2012 0515   ALKPHOS 85 08/28/2012 1208   ALKPHOS 85 05/26/2012 0955   AST 17 08/28/2012 1208   AST 22 05/26/2012 0955   ALT 24 08/28/2012 1208   ALT 16 05/26/2012 0955   BILITOT 0.35 08/28/2012 1208   BILITOT 0.3 05/26/2012 0955       RADIOGRAPHIC STUDIES:  No results found.  ASSESSMENT: 67 year old female with:  #1 DCIS of the breast. She is on tamoxifen 20 mg daily. However I have recommended that we discontinue the tamoxifen for now a do to her recent diagnosis of ovarian cancer.  #2 recent diagnosis of  gynecologic malignancy this is ovarian carcinoma). Patient is currently receiving neoadjuvant chemotherapy consisting of Taxol and carboplatinum. Overall she is tolerating it well. Recent CT performed on January 8 showed a minute response to chemotherapy. Total of 6 cycles of Taxol and carboplatinum was given.  #3 patient is now status post laparotomy performed on 05/30/2012. Unfortunately intraoperatively she was found to have significant residual disease. 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 Patient and I discussed the pathology results today. We also discussed further treatment options. This would include systemic chemotherapy. The regimen gemcitabine and carboplatinum with Avastin is a possibility. However I would like to have her recover from her surgery the next few weeks. She is also scheduled to be seen by Dr. Grant Ruts.   #4 patient has had chemotherapy sensitivity testing performed unfortunately the results were inconclusive. We discussed the possibility of doing to topotecan and Avastin. This is discussed with the patient. Dr. Grant Ruts has also recommended this.   #5 patient was started topotecan and avastin  With topotecan q week on day 1, 8,15 on a 28 day cycle, avastin q 3 weeks  #6 depression/anxiety: Currently on Effexor with good response  #7 neutropenia resolved due to Neupogen  #8 dehydration  #9 cold sores/herpes labialis.  #10 neuropathy on gabapentin  PLAN: #1 overall you're doing well.  #2 for mouth sores  begin valacyclovir her 500 mg daily. Prescription was sent to your pharmacy.  #3 proceed with  cycle 2 day 15 ofTopotecan  #4 return to clinic on 09/11/2012 4 of Avastin.  All questions were answered. The patient knows to call the clinic with any problems, questions or concerns. We can certainly see the patient much sooner if necessary.  I spent 25 minutes counseling the  patient face to face. The total time spent in the appointment was 30 minutes.  Drue Second, MD Medical/Oncology [facility] [phone] (beeper) [phone] (Office)   .

## 2012-09-04 NOTE — Patient Instructions (Signed)
[facility] Discharge Instructions for Patients Receiving Chemotherapy  Today you received the following chemotherapy agents: Topotecan. To help prevent nausea and vomiting after your treatment, we encourage you to take your nausea medication, Compazine or Zofran.  Take as needed.   If you develop nausea and vomiting that is not controlled by your nausea medication, call the clinic.   BELOW ARE SYMPTOMS THAT SHOULD BE REPORTED IMMEDIATELY:  *FEVER GREATER THAN 100.5 F  *CHILLS WITH OR WITHOUT FEVER  NAUSEA AND VOMITING THAT IS NOT CONTROLLED WITH YOUR NAUSEA MEDICATION  *UNUSUAL SHORTNESS OF BREATH  *UNUSUAL BRUISING OR BLEEDING  TENDERNESS IN MOUTH AND THROAT WITH OR WITHOUT PRESENCE OF ULCERS  *URINARY PROBLEMS  *BOWEL PROBLEMS  UNUSUAL RASH Items with * indicate a potential emergency and should be followed up as soon as possible.  Feel free to call the clinic should you have any questions or concerns. The clinic phone number is [phone].

## 2012-09-04 NOTE — Patient Instructions (Addendum)
#  1 overall you're doing well.  #2 for mouth sores begin valacyclovir her 500 mg daily. Prescription was sent to your pharmacy.  #3 proceed with  cycle 2 day 15 ofTopotecan  #4 return to clinic on 09/11/2012 4 of Avastin.

## 2012-09-04 NOTE — Telephone Encounter (Signed)
appts made and printed...td 

## 2012-09-11 ENCOUNTER — Ambulatory Visit (HOSPITAL_BASED_OUTPATIENT_CLINIC_OR_DEPARTMENT_OTHER): Payer: Medicare Other | Admitting: Family

## 2012-09-11 ENCOUNTER — Other Ambulatory Visit (HOSPITAL_BASED_OUTPATIENT_CLINIC_OR_DEPARTMENT_OTHER): Payer: BC Managed Care – PPO | Admitting: Lab

## 2012-09-11 ENCOUNTER — Encounter: Payer: Self-pay | Admitting: Family

## 2012-09-11 ENCOUNTER — Ambulatory Visit (HOSPITAL_BASED_OUTPATIENT_CLINIC_OR_DEPARTMENT_OTHER): Payer: Medicare Other

## 2012-09-11 VITALS — BP 110/69 | HR 82 | Temp 98.5°F | Resp 20 | Ht 66.0 in | Wt 145.0 lb

## 2012-09-11 DIAGNOSIS — G589 Mononeuropathy, unspecified: Secondary | ICD-10-CM

## 2012-09-11 DIAGNOSIS — D0590 Unspecified type of carcinoma in situ of unspecified breast: Secondary | ICD-10-CM

## 2012-09-11 DIAGNOSIS — C786 Secondary malignant neoplasm of retroperitoneum and peritoneum: Secondary | ICD-10-CM

## 2012-09-11 DIAGNOSIS — C569 Malignant neoplasm of unspecified ovary: Secondary | ICD-10-CM

## 2012-09-11 DIAGNOSIS — R0602 Shortness of breath: Secondary | ICD-10-CM

## 2012-09-11 DIAGNOSIS — Z5112 Encounter for antineoplastic immunotherapy: Secondary | ICD-10-CM

## 2012-09-11 DIAGNOSIS — F341 Dysthymic disorder: Secondary | ICD-10-CM

## 2012-09-11 LAB — COMPREHENSIVE METABOLIC PANEL (CC13)
AST: 14 U/L (ref 5–34)
Alkaline Phosphatase: 75 U/L (ref 40–150)
BUN: 18.1 mg/dL (ref 7.0–26.0)
Calcium: 9 mg/dL (ref 8.4–10.4)
Creatinine: 0.7 mg/dL (ref 0.6–1.1)
Glucose: 123 mg/dl (ref 70–140)

## 2012-09-11 LAB — CBC WITH DIFFERENTIAL/PLATELET
BASO%: 1.2 % (ref 0.0–2.0)
Basophils Absolute: 0 10*3/uL (ref 0.0–0.1)
EOS%: 5.6 % (ref 0.0–7.0)
HCT: 30.1 % — ABNORMAL LOW (ref 34.8–46.6)
LYMPH%: 27.8 % (ref 14.0–49.7)
MCH: 28 pg (ref 25.1–34.0)
MCHC: 31.9 g/dL (ref 31.5–36.0)
MCV: 87.8 fL (ref 79.5–101.0)
MONO%: 7.1 % (ref 0.0–14.0)
NEUT%: 58.3 % (ref 38.4–76.8)
Platelets: 181 10*3/uL (ref 145–400)

## 2012-09-11 MED ORDER — HEPARIN SOD (PORK) LOCK FLUSH 100 UNIT/ML IV SOLN
500.0000 [IU] | Freq: Once | INTRAVENOUS | Status: AC | PRN
  Administered 2012-09-11: 500 [IU]
  Filled 2012-09-11: qty 5

## 2012-09-11 MED ORDER — SODIUM CHLORIDE 0.9 % IV SOLN
Freq: Once | INTRAVENOUS | Status: AC
  Administered 2012-09-11: 13:00:00 via INTRAVENOUS

## 2012-09-11 MED ORDER — SODIUM CHLORIDE 0.9 % IV SOLN: 15.0000 mg/kg | Freq: Once | INTRAVENOUS | Status: DC

## 2012-09-11 MED ORDER — SODIUM CHLORIDE 0.9 % IJ SOLN
10.0000 mL | INTRAMUSCULAR | Status: DC | PRN
  Administered 2012-09-11: 10 mL
  Filled 2012-09-11: qty 10

## 2012-09-11 MED ORDER — SODIUM CHLORIDE 0.9 % IV SOLN
15.0000 mg/kg | Freq: Once | INTRAVENOUS | Status: AC
  Administered 2012-09-11: 1000 mg via INTRAVENOUS
  Filled 2012-09-11: qty 40

## 2012-09-11 NOTE — Progress Notes (Signed)
1430 At arrival patients PAC flushed good with positive blood return, but not enough to collect lab. Treatment completed, PAC flushed good again with no blood return to collect lab. Patient did not wish to stay to have port cathflo'd. Patient sent back to lab for PIV lab draw.

## 2012-09-11 NOTE — Progress Notes (Signed)
[facility]  [phone] [phone]  OFFICE PROGRESS NOTE   PCP: [name] [name], M.D. SU: Emelia Loron, M.D. RAD ONC: Antony Blackbird, M.D.   DIAGNOSIS: [name] [name] is a 67 year-old female with a history of ductal carcinoma in situ of the left breast diagnosed 04/2009 and ovarian cancer diagnosed in 12/2011.   PRIOR THERAPY: #1. 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 Patient had 3 sentinel biopsied all of them were negative for metastatic disease. Patient underwent radiation therapy between 06/05/2009 through 07/02/2009. She was then begun on Tamoxifen 20 mg daily in 09/2009.  #2  In 12/2011 she received the diagnosis of gynecologic malignancy presenting with abdominal mass and pleural effusions. Patient was originally hospitalized with shortness of breath in 12/2011.  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 She also had malignant ascites.  She underwent bilateral thoracentesis and paracentesis procedures during her hospitalization. During her hospitalization she was seen by gynecologic oncology.  #3 Patient began neoadjuvant chemotherapy consisting of Taxol and Carboplatinum. Her first cycle was administered during her hospitalization. She has completed a total of 6 cycles of Taxol/Carboplatinum from 01/07/2012 - 06/23/2012. Overall she tolerated it well except for some fatigue and neuropathies.  #4 Patient is status post laparotomy in 05/2012 that revealed significant residual disease.   #5  Patient began adjuvant therapy with Topotecan/Avastin beginning on 07/24/2012.   CURRENT THERAPY: She presents today to proceed with Avastin therapy.   INTERVAL HISTORY:  [name] 67 y.o. female returns today for followup of ovarian cancer.  She is accompanied for today's office visit by her  husband [name] [name].  Since her last office visit on 09/04/2012 she reports that she is doing well. She has mild oral/tongue sores for which she is using Magic Mouthwash. She also has complaints of back discomfort with activity that spontaneously resolves. She otherwise denies any fevers, chills, night sweats, nausea or vomiting and headaches.  She continues to have shortness of breath on exertion. She had a chest x-ray performed on 08/30/2012 which showed no acute findings.  Her neuropathy has improved since starting on Gabapentin.     MEDICAL HISTORY: Past Medical History  Diagnosis Date  . Interstitial cystitis     on chronic antibiotics  . Hyperlipidemia   . Frequent UTI     on prophylaxis  . 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 CAD (coronary artery disease)     a. s/p CABG 7/13;   b.  LHC 12/01/11:  pLAD 70%, mLAD 40%, CFX 40-50% prior to takeoff of the OM2, oRCA occluded, mid vessel filled via R->R collaterals and distal vessel filled by L->R collaterals, S-OM1/OM2 (small and diffusely dz) with mid 90% stenosis, 90% at OM1 anastomotic site, continuation of OM2 occluded, S-Dx patent, S-PDA occluded, L-LAD ok, EF 55-65%  => Med Rx rec.  [name] Hx of echocardiogram     Echo 5/13: EF 60-65%, grade 2 diastolic dysfunction  . Hypercholesterolemia   . Pleural effusion 12/28/11    s/p pleurx catheter  . GERD (gastroesophageal reflux disease)   . Arthritis     "just a little; lower back" (12/28/11)  . Gout attack 08/2011    related to "stress post OHS"  . Depression   . Breast cancer     "left"; on Tamoxifen  . S/P thoracentesis 12/28/11    "for pleural effusion" (  12/28/2011)  . Ovarian cancer   . Tachycardia   . Neuromuscular disorder     peripheral neuropathy    ALLERGIES:  is allergic to codeine; isosorbide; other; phenothiazines; talwin; yellow jacket venom; and ambien.  MEDICATIONS:  Current Outpatient Prescriptions  Medication Sig Dispense Refill  . ALPRAZolam (XANAX) 0.25 MG tablet Take 1  tablet (0.25 mg total) by mouth every 8 (eight) hours as needed for anxiety.  30 tablet  4  . Alum & Mag Hydroxide-Simeth (MAGIC MOUTHWASH) SOLN Take 5 mLs by mouth 4 (four) times daily as needed (mouth sores).      [name] aspirin 325 MG tablet Take 325 mg by mouth at bedtime.       [name] atorvastatin (LIPITOR) 20 MG tablet TAKE ONE TABLET BY MOUTH ONCE DAILY  90 tablet  3  . carvedilol (COREG) 25 MG tablet Take 1 tablet (25 mg total) by mouth 2 (two) times daily.  180 tablet  3  . dexamethasone (DECADRON) 4 MG tablet Take 2 tablets (8 mg total) by mouth 2 (two) times daily with a meal. Take daily starting the day after chemotherapy for 2 days. Take with food.  30 tablet  1  . docusate sodium (COLACE) 100 MG capsule Take 200 mg by mouth daily.      [name] EPINEPHrine (EPIPEN 2-PAK) 0.3 mg/0.3 mL DEVI Inject 0.3 mg into the muscle daily as needed (allergic reaction).       . fluticasone (FLONASE) 50 MCG/ACT nasal spray Place 2 sprays into the nose daily as needed for allergies.      [name] gabapentin (NEURONTIN) 100 MG capsule Take 100 mg orally three times a day  90 capsule  6  . HYDROcodone-acetaminophen (NORCO/VICODIN) 5-325 MG per tablet Take 1-2 tablets by mouth every 4 (four) hours as needed.  40 tablet  2  . lidocaine-prilocaine (EMLA) cream Apply topically as needed.  30 g  1  . LORazepam (ATIVAN) 0.5 MG tablet Take 1 tablet (0.5 mg total) by mouth every 6 (six) hours as needed (Nausea or vomiting).  30 tablet  0  . nitroGLYCERIN (NITROSTAT) 0.4 MG SL tablet Place 0.4 mg under the tongue every 5 (five) minutes as needed. Chest pain      . omeprazole (PRILOSEC) 20 MG capsule Take 1 capsule (20 mg total) by mouth 2 (two) times daily.  60 capsule  12  . ondansetron (ZOFRAN) 8 MG tablet Take 1 tablet (8 mg total) by mouth 2 (two) times daily. Take two times a day starting the day after chemo for 2 days. Then take two times a day as needed for nausea or vomiting.  30 tablet  1  . oxybutynin (DITROPAN-XL) 10 MG 24 hr  tablet Take 10 mg by mouth daily.      Bertram Gala Glycol-Propyl Glycol 0.4-0.3 % SOLN Place 1 drop into both eyes every morning.       . prochlorperazine (COMPAZINE) 25 MG suppository Place 1 suppository (25 mg total) rectally every 12 (twelve) hours as needed for nausea.  12 suppository  3  . SF 5000 PLUS 1.1 % CREA dental cream Place 1.1 % onto teeth at bedtime.       [name] trimethoprim (TRIMPEX) 100 MG tablet Take 100 mg by mouth every morning.       . valACYclovir (VALTREX) 500 MG tablet Take 1 tablet (500 mg total) by mouth daily.  30 tablet  7  . venlafaxine XR (EFFEXOR-XR) 75 MG 24 hr capsule Take 75 mg  Orally daily  30 capsule  8  . prochlorperazine (COMPAZINE) 10 MG tablet Take 1 tablet (10 mg total) by mouth every 6 (six) hours as needed (Nausea or vomiting).  30 tablet  1   No current facility-administered medications for this visit.   Facility-Administered Medications Ordered in Other Visits  Medication Dose Route Frequency Provider Last Rate Last Dose  . influenza  inactive virus vaccine (FLUZONE/FLUARIX) injection 0.5 mL  0.5 mL Intramuscular Once Joselyn Arrow, MD        SURGICAL HISTORY:  Past Surgical History  Procedure Laterality Date  . Breast lumpectomy  04/2009    left  . Cesarean section  1973; 1976  . Muscle release  1960    L neck and chest.; "when I was 13; pneumonia settled in my left neck"  . Coronary artery bypass graft  08/18/2011    Procedure: CORONARY ARTERY BYPASS GRAFTING (CABG);  Surgeon: Alleen Borne, MD;  Location: [facility] OR;  Service: Open Heart Surgery;  Laterality: N/A;  Coronary Artery Bypass Graft times five utilizing the left internal mammary artery and the left greater saphenous vein harvested endoscopically.  . Abdominal hysterectomy  1976  . Appendectomy  1976  . Portacath placement  01/06/2012    Procedure: INSERTION PORT-A-CATH;  Surgeon: Alleen Borne, MD;  Location: [facility] OR;  Service: Thoracic;  Laterality: Left;  . Chest tube insertion  01/06/2012     Procedure: INSERTION PLEURAL DRAINAGE CATHETER;  Surgeon: Alleen Borne, MD;  Location: [facility] OR;  Service: Thoracic;  Laterality: Bilateral;  . Removal of pleural drainage catheter Right 04/12/2012    Procedure: MINOR REMOVAL OF PLEURAL DRAINAGE CATHETER;  Surgeon: Alleen Borne, MD;  Location: [facility] OR;  Service: Thoracic;  Laterality: Right;  . Talc pleurodesis Left 04/12/2012    Procedure: Lurlean Nanny;  Surgeon: Alleen Borne, MD;  Location: [facility] OR;  Service: Thoracic;  Laterality: Left;  . Portacath placement Left 04/17/2012    Procedure: INSERTION PORT-A-CATH;  Surgeon: Alleen Borne, MD;  Location: [facility] OR;  Service: Thoracic;  Laterality: Left;  . Removal of pleural drainage catheter Left 04/17/2012    Procedure: REMOVAL OF PLEURAL DRAINAGE CATHETER;  Surgeon: Alleen Borne, MD;  Location: [facility] OR;  Service: Thoracic;  Laterality: Left;  . Port-a-cath removal Left 04/17/2012    Procedure: REMOVAL PORT-A-CATH;  Surgeon: Alleen Borne, MD;  Location: [facility] OR;  Service: Thoracic;  Laterality: Left;  . Cardiac catheterization    . Laparotomy Bilateral 05/30/2012    Procedure: Resection of umbilical mass, Partial omentectomy;  Surgeon: Jeannette Corpus, MD;  Location: [facility] ORS;  Service: Gynecology;  Laterality: Bilateral;    REVIEW OF SYSTEMS:   A 10 point review of systems was completed and is negative except as noted above.  PHYSICAL EXAMINATION:  BP 110/69  Pulse 82  Temp(Src) 98.5 F (36.9 C) (Oral)  Resp 20  Ht 5\' 6"  (1.676 m)  Wt 145 lb (65.772 kg)  BMI 23.41 kg/m2  BP lying 112/74, HR 94, BP sitting 116/75, HR 95, BP standing 115/73, HR 105  General appearance: Alert, cooperative, thin frame no apparent distress Head: Normocephalic, without obvious abnormality, atraumatic Eyes: Arcus senilis, PERRLA, EOMI Nose: Nares, septum and mucosa are normal, no drainage or sinus tenderness Neck: No adenopathy, supple, symmetrical, trachea midline, no tenderness Resp: Clear to  auscultation bilaterally Cardio: Regular rate and rhythm, S1, S2 normal, 1/6 murmur, no click, rub or gallop, left chest Port-A-Cath covered and EMLA cream  Breasts:  Deferred GI: Soft, slight distention, non-tender, hypoactive bowel sounds, no organomegaly Extremities: Extremities normal, atraumatic, no cyanosis or edema Lymph nodes: Cervical and supraclavicular lymph nodes are normal Neurologic: Grossly normal ECOG PERFORMANCE STATUS: 1 - Symptomatic but completely ambulatory.   LABORATORY DATA: Lab Results  Component Value Date   WBC 2.5* 09/11/2012   HGB 9.6* 09/11/2012   HCT 30.1* 09/11/2012   MCV 87.8 09/11/2012   PLT 181 09/11/2012      Chemistry      Component Value Date/Time   NA 139 09/11/2012 1032   NA 134* 06/02/2012 0515   NA 141 12/17/2011 1144   K 3.9 09/11/2012 1032   K 4.4 06/02/2012 0515   CL 103 08/07/2012 1108   CL 100 06/02/2012 0515   CO2 19* 09/11/2012 1032   CO2 25 06/02/2012 0515   BUN 18.1 09/11/2012 1032   BUN 20 06/02/2012 0515   BUN 15 12/17/2011 1144   CREATININE 0.7 09/11/2012 1032   CREATININE 0.76 06/02/2012 0515      Component Value Date/Time   CALCIUM 9.0 09/11/2012 1032   CALCIUM 8.7 06/02/2012 0515   ALKPHOS 75 09/11/2012 1032   ALKPHOS 85 05/26/2012 0955   AST 14 09/11/2012 1032   AST 22 05/26/2012 0955   ALT 15 09/11/2012 1032   ALT 16 05/26/2012 0955   BILITOT 0.22 09/11/2012 1032   BILITOT 0.3 05/26/2012 0955       RADIOGRAPHIC STUDIES: Dg Chest 2 View 08/30/2012   *RADIOLOGY REPORT*  Clinical Data: Shortness of breath, history metastatic ovarian carcinoma and history of bilateral pleural effusions.  CHEST - 2 VIEW  Comparison: 05/26/2012  Findings: There is significant diminishment in bilateral pleural effusions since the prior study with only trace amount of pleural fluid present bilaterally.  The lungs show no evidence of focal consolidation, masses or pulmonary edema.  Port-A-Cath positioning stable in the SVC.  The heart size is normal.  No bony  lesions are seen.  IMPRESSION: Significant diminishment in bilateral pleural effusions since the prior chest x-ray. Trace bilateral pleural effusions remain.  No acute findings.   Original Report Authenticated By: Irish Lack, M.D.     ASSESSMENT: 67 year old woman:   #1 History of DCIS of the left breast status post lumpectomy in 04/2009.  Status post radiation therapy completed in 06/2009.  Was on antiestrogen therapy with Tamoxifen 20 mg by mouth daily that was started in 09/2009.  Tamoxifen has been discontinued for now due to her recent diagnosis of ovarian cancer.  #2 Gynecologic malignancy (ovarian carcinoma) diagnosed in 12/2011. Patient received 6 cycles of neoadjuvant chemotherapy consisting of Taxol/Carboplatinum completed in 06/2012.  #3 She is now status post laparotomy performed on 05/30/2012. Unfortunately, intraoperatively she was found to have significant residual disease. 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   #4 Patient completed chemotherapy sensitivity testing. Unfortunately the results were inconclusive.  It was decided to proceed with chemotherapy consisting of Topotecan and Avastin.  Dr. Grant Ruts also recommended this treatment regimen.  #5 Patient was started chemotherapy consisting of Topotecan and Avastin  With Topotecan q week on day 1, 8,15 on a 28 day cycle, and Avastin scheduled for every 3 weeks.  #6 Depression/anxiety: Currently on Effexor with good response  #7 Nutropenia resolved due to Neupogen  #8 History of dehydration -patient instructed to drink plenty of water  #9  Cold sores/herpes labialis.  #10 Neuropathy -  currently on Gabapentin 100 mg by mouth 3 times a day.   PLAN: #1 Today the patient will proceed with every 3 week Avastin therapy.  She is scheduled to start day 1 of cycle 3 on 09/25/2012 and is scheduled to receive Topotecan on that date.   (Note: Cycle 3 will be postponed a week due to the patient and her family being on vacation on 09/18/2012).  #2 For mouth sores she will continue taking Valacyclovir 500 mg by mouth daily.   #3 We plan to see Mrs. Creely again on 09/25/2012 at which time we will check laboratories of CBC and CMP.    All questions were answered.  Mr. and Mrs. Andersson were encouraged to contact us with any questions, problems or concerns.   Larina Bras, NP-C 09/11/2012  7:25 PM

## 2012-09-11 NOTE — Patient Instructions (Addendum)
Please contact us at [phone] if you have any questions or concerns.  Please continue to do well and enjoy life!!!  Get plenty of rest, drink plenty of water, exercise daily (walking), eat a balanced diet.  Results for orders placed in visit on 09/11/12 (from the past 24 hour(s))  UA PROTEIN, DIPSTICK - [facility]     Status: None   Collection Time    09/11/12 10:30 AM      Result Value Range   Protein, ur < 30  Negative- <30 mg/dL   Narrative:    Performed At:  [facility]               [address]               [place], [place] [address]  CBC WITH DIFFERENTIAL     Status: Abnormal   Collection Time    09/11/12 10:32 AM      Result Value Range   WBC 2.5 (*) 3.9 - 10.3 10e3/uL   NEUT# 1.5  1.5 - 6.5 10e3/uL   HGB 9.6 (*) 11.6 - 15.9 g/dL   HCT 60.4 (*) 54.0 - 98.1 %   Platelets 181  145 - 400 10e3/uL   MCV 87.8  79.5 - 101.0 fL   MCH 28.0  25.1 - 34.0 pg   MCHC 31.9  31.5 - 36.0 g/dL   RBC 1.91 (*) 4.78 - 2.95 10e6/uL   RDW 18.4 (*) 11.2 - 14.5 %   lymph# 0.7 (*) 0.9 - 3.3 10e3/uL   MONO# 0.2  0.1 - 0.9 10e3/uL   Eosinophils Absolute 0.1  0.0 - 0.5 10e3/uL   Basophils Absolute 0.0  0.0 - 0.1 10e3/uL   NEUT% 58.3  38.4 - 76.8 %   LYMPH% 27.8  14.0 - 49.7 %   MONO% 7.1  0.0 - 14.0 %   EOS% 5.6  0.0 - 7.0 %   BASO% 1.2  0.0 - 2.0 %   Narrative:    Performed At:  [facility]               [address]               [place], [place] [address]

## 2012-09-11 NOTE — Patient Instructions (Addendum)
[facility] Discharge Instructions for Patients Receiving Chemotherapy  Today you received the following chemotherapy agent Avastin.  To help prevent nausea and vomiting after your treatment, we encourage you to take your nausea medication.   If you develop nausea and vomiting that is not controlled by your nausea medication, call the clinic.   BELOW ARE SYMPTOMS THAT SHOULD BE REPORTED IMMEDIATELY:  *FEVER GREATER THAN 100.5 F  *CHILLS WITH OR WITHOUT FEVER  NAUSEA AND VOMITING THAT IS NOT CONTROLLED WITH YOUR NAUSEA MEDICATION  *UNUSUAL SHORTNESS OF BREATH  *UNUSUAL BRUISING OR BLEEDING  TENDERNESS IN MOUTH AND THROAT WITH OR WITHOUT PRESENCE OF ULCERS  *URINARY PROBLEMS  *BOWEL PROBLEMS  UNUSUAL RASH Items with * indicate a potential emergency and should be followed up as soon as possible.  Feel free to call the clinic you have any questions or concerns. The clinic phone number is [phone].  Bevacizumab injection What is this medicine? BEVACIZUMAB (be va SIZ yoo mab) is a chemotherapy drug. It targets a protein found in many cancer cell types, and halts cancer growth. This drug treats many cancers including non-small cell lung cancer, and colon or rectal cancer. It is usually given with other chemotherapy drugs. This medicine may be used for other purposes; ask your health care provider or pharmacist if you have questions. What should I tell my health care provider before I take this medicine? They need to know if you have any of these conditions: -blood clots -heart disease, including heart failure, heart attack, or chest pain (angina) -high blood pressure -infection (especially a virus infection such as chickenpox, cold sores, or herpes) -kidney disease -lung disease -prior chemotherapy with doxorubicin, daunorubicin, epirubicin, or other anthracycline type chemotherapy agents -recent or ongoing radiation therapy -recent surgery -stroke -an  unusual or allergic reaction to bevacizumab, hamster proteins, mouse proteins, other medicines, foods, dyes, or preservatives -pregnant or trying to get pregnant -breast-feeding How should I use this medicine? This medicine is for infusion into a vein. It is given by a health care professional in a hospital or clinic setting. Talk to your pediatrician regarding the use of this medicine in children. Special care may be needed. Overdosage: If you think you have taken too much of this medicine contact a poison control center or emergency room at once. NOTE: This medicine is only for you. Do not share this medicine with others. What if I miss a dose? It is important not to miss your dose. Call your doctor or health care professional if you are unable to keep an appointment. What may interact with this medicine? Interactions are not expected. This list may not describe all possible interactions. Give your health care provider a list of all the medicines, herbs, non-prescription drugs, or dietary supplements you use. Also tell them if you smoke, drink alcohol, or use illegal drugs. Some items may interact with your medicine. What should I watch for while using this medicine? Your condition will be monitored carefully while you are receiving this medicine. You will need important blood work and urine testing done while you are taking this medicine. During your treatment, let your health care professional know if you have any unusual symptoms, such as difficulty breathing. This medicine may rarely cause 'gastrointestinal perforation' (holes in the stomach, intestines or colon), a serious side effect requiring surgery to repair. This medicine should be started at least 28 days following major surgery and the site of the surgery should be totally healed. Check with your  doctor before scheduling dental work or surgery while you are receiving this treatment. Talk to your doctor if you have recently had surgery  or if you have a wound that has not healed. Do not become pregnant while taking this medicine. Women should inform their doctor if they wish to become pregnant or think they might be pregnant. There is a potential for serious side effects to an unborn child. Talk to your health care professional or pharmacist for more information. Do not breast-feed an infant while taking this medicine. This medicine has caused ovarian failure in some women. This medicine may interfere with the ability to have a child. You should talk to your doctor or health care professional if you are concerned about your fertility. What side effects may I notice from receiving this medicine? Side effects that you should report to your doctor or health care professional as soon as possible: -allergic reactions like skin rash, itching or hives, swelling of the face, lips, or tongue -signs of infection - fever or chills, cough, sore throat, pain or trouble passing urine -signs of decreased platelets or bleeding - bruising, pinpoint red spots on the skin, black, tarry stools, nosebleeds, blood in the urine -breathing problems -changes in vision -chest pain -confusion -jaw pain, especially after dental work -mouth sores -seizures -severe abdominal pain -severe headache -sudden numbness or weakness of the face, arm or leg -swelling of legs or ankles -symptoms of a stroke: change in mental awareness, inability to talk or move one side of the body (especially in patients with lung cancer) -trouble passing urine or change in the amount of urine -trouble speaking or understanding -trouble walking, dizziness, loss of balance or coordination Side effects that usually do not require medical attention (report to your doctor or health care professional if they continue or are bothersome): -constipation -diarrhea -dry skin -headache -loss of appetite -nausea, vomiting This list may not describe all possible side effects. Call your  doctor for medical advice about side effects. You may report side effects to FDA at 1-800-FDA-1088. Where should I keep my medicine? This drug is given in a hospital or clinic and will not be stored at home. NOTE: This sheet is a summary. It may not cover all possible information. If you have questions about this medicine, talk to your doctor, pharmacist, or health care provider.  2013, [employer]. (01/02/2010 4:25:37 PM)

## 2012-09-20 NOTE — Progress Notes (Signed)
OFFICE PROGRESS NOTE  CC KNAPP,[name], MD [address] [place] [address] Dr. Emelia Loron Dr. 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  PRIOR THERAPY: #1. S/P left breast lumpectomy in March 2011 after she had a screen detected 1.0 cm ER+, PR+, intermediate grade DCIS. Patient had 3 sentinel biopsied all of them were negative for metastatic disease. Patient underwent radiation therapy between 06/05/2009 through 07/02/2009. She was then begun on tamoxifen 20 mg daily in August 2011.  #2  diagnosis of gynecologic malignancy presenting with abdominal mass and pleural effusions. Patient was recently hospitalized with shortness of breath she was discovered to have malignant pleural effusion she is status post Pleurx catheter is. She also had malignant ascites she's had several paracentesis procedures performed during her hospitalization. During her hospitalization she was seen by gynecologic oncology.  #3 patient began neoadjuvant chemotherapy consisting of Taxol and carboplatinum. Her first cycle was administered during her hospitalization. She has completed aTotal of 6 cycles of Taxol and carboplatinum from 01/07/2012 - 06/23/2012. Overall she tolerated it well except for some fatigue and neuropathies.  #4 patient is  status post laparotomy that revealed significant residual disease. I discussed her for pathology from 05/30/2012  #5  Patient began topotecan/avastin beginning on 07/24/12, here for cycle 2 day 1  CURRENT THERAPY: Proceed with  topotecan   INTERVAL HISTORY: [name] [name] 67 y.o. female returns for followup visit prior to her scheduled  topotecan today.Since her last visit she is feeling much better. I do think the IV fluids significantly helped her. She is denying having any nausea vomiting fevers chills or night sweats. She has no myalgias and arthralgias she has been bleeding. She is  continuing to do her arts and crafts.Remainder of the 10 point review of systems is negative.   UMEDICAL HISTORY: Past Medical History  Diagnosis Date  . Interstitial cystitis     on chronic antibiotics  . Hyperlipidemia   . Frequent UTI     on prophylaxis  . 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 CAD (coronary artery disease)     a. s/p CABG 7/13;   b.  LHC 12/01/11:  pLAD 70%, mLAD 40%, CFX 40-50% prior to takeoff of the OM2, oRCA occluded, mid vessel filled via R->R collaterals and distal vessel filled by L->R collaterals, S-OM1/OM2 (small and diffusely dz) with mid 90% stenosis, 90% at OM1 anastomotic site, continuation of OM2 occluded, S-Dx patent, S-PDA occluded, L-LAD ok, EF 55-65%  => Med Rx rec.  [name] Hx of echocardiogram     Echo 5/13: EF 60-65%, grade 2 diastolic dysfunction  . Hypercholesterolemia   . Pleural effusion 12/28/11    s/p pleurx catheter  . GERD (gastroesophageal reflux disease)   . Arthritis     "just a little; lower back" (12/28/11)  . Gout attack 08/2011    related to "stress post OHS"  . Depression   . Breast cancer     "left"; on Tamoxifen  . S/P thoracentesis 12/28/11    "for pleural effusion" (12/28/2011)  . Ovarian cancer   . Tachycardia   . Neuromuscular disorder     peripheral neuropathy    ALLERGIES:  is allergic to codeine; isosorbide; other; phenothiazines; talwin; yellow jacket venom; and ambien.  MEDICATIONS:  Current Outpatient Prescriptions  Medication Sig Dispense Refill  . ALPRAZolam (XANAX) 0.25 MG tablet Take 1 tablet (0.25 mg total) by mouth every 8 (eight) hours  as needed for anxiety.  30 tablet  4  . Alum & Mag Hydroxide-Simeth (MAGIC MOUTHWASH) SOLN Take 5 mLs by mouth 4 (four) times daily as needed (mouth sores).      [name] aspirin 325 MG tablet Take 325 mg by mouth at bedtime.       . carvedilol (COREG) 25 MG tablet Take 1 tablet (25 mg total) by mouth 2 (two) times daily.  180 tablet  3  . docusate sodium (COLACE) 100 MG capsule Take 200  mg by mouth daily.      [name] gabapentin (NEURONTIN) 100 MG capsule Take 100 mg orally three times a day  90 capsule  6  . lidocaine-prilocaine (EMLA) cream Apply topically as needed.  30 g  1  . LORazepam (ATIVAN) 0.5 MG tablet Take 1 tablet (0.5 mg total) by mouth every 6 (six) hours as needed (Nausea or vomiting).  30 tablet  0  . omeprazole (PRILOSEC) 20 MG capsule Take 1 capsule (20 mg total) by mouth 2 (two) times daily.  60 capsule  12  . ondansetron (ZOFRAN) 8 MG tablet Take 1 tablet (8 mg total) by mouth 2 (two) times daily. Take two times a day starting the day after chemo for 2 days. Then take two times a day as needed for nausea or vomiting.  30 tablet  1  . oxybutynin (DITROPAN-XL) 10 MG 24 hr tablet Take 10 mg by mouth daily.      Bertram Gala Glycol-Propyl Glycol 0.4-0.3 % SOLN Place 1 drop into both eyes every morning.       . prochlorperazine (COMPAZINE) 10 MG tablet Take 1 tablet (10 mg total) by mouth every 6 (six) hours as needed (Nausea or vomiting).  30 tablet  1  . SF 5000 PLUS 1.1 % CREA dental cream Place 1.1 % onto teeth at bedtime.       [name] trimethoprim (TRIMPEX) 100 MG tablet Take 100 mg by mouth every morning.       . venlafaxine XR (EFFEXOR-XR) 75 MG 24 hr capsule Take 75 mg  Orally daily  30 capsule  8  . atorvastatin (LIPITOR) 20 MG tablet TAKE ONE TABLET BY MOUTH ONCE DAILY  90 tablet  3  . dexamethasone (DECADRON) 4 MG tablet Take 2 tablets (8 mg total) by mouth 2 (two) times daily with a meal. Take daily starting the day after chemotherapy for 2 days. Take with food.  30 tablet  1  . EPINEPHrine (EPIPEN 2-PAK) 0.3 mg/0.3 mL DEVI Inject 0.3 mg into the muscle daily as needed (allergic reaction).       . fluticasone (FLONASE) 50 MCG/ACT nasal spray Place 2 sprays into the nose daily as needed for allergies.      [name] HYDROcodone-acetaminophen (NORCO/VICODIN) 5-325 MG per tablet Take 1-2 tablets by mouth every 4 (four) hours as needed.  40 tablet  2  . nitroGLYCERIN  (NITROSTAT) 0.4 MG SL tablet Place 0.4 mg under the tongue every 5 (five) minutes as needed. Chest pain      . prochlorperazine (COMPAZINE) 25 MG suppository Place 1 suppository (25 mg total) rectally every 12 (twelve) hours as needed for nausea.  12 suppository  3  . valACYclovir (VALTREX) 500 MG tablet Take 1 tablet (500 mg total) by mouth daily.  30 tablet  7   No current facility-administered medications for this visit.   Facility-Administered Medications Ordered in Other Visits  Medication Dose Route Frequency Provider Last Rate Last Dose  . influenza  inactive virus vaccine (FLUZONE/FLUARIX) injection 0.5 mL  0.5 mL Intramuscular Once Joselyn Arrow, MD        SURGICAL HISTORY:  Past Surgical History  Procedure Laterality Date  . Breast lumpectomy  04/2009    left  . Cesarean section  1973; 1976  . Muscle release  1960    L neck and chest.; "when I was 13; pneumonia settled in my left neck"  . Coronary artery bypass graft  08/18/2011    Procedure: CORONARY ARTERY BYPASS GRAFTING (CABG);  Surgeon: Alleen Borne, MD;  Location[facility] OR;  Service: Open Heart Surgery;  Laterality: N/A;  Coronary Artery Bypass Graft times five utilizing the left internal mammary artery and the left greater saphenous vein harvested endoscopically.  . Abdominal hysterectomy  1976  . Appendectomy  1976  . Portacath placement  01/06/2012    Procedure: INSERTION PORT-A-CATH;  Surgeon: Alleen Borne, MD;  Location: [facility] OR;  Service: Thoracic;  Laterality: Left;  . Chest tube insertion  01/06/2012    Procedure: INSERTION PLEURAL DRAINAGE CATHETER;  Surgeon: Alleen Borne, MD;  Location: [facility] OR;  Service: Thoracic;  Laterality: Bilateral;  . Removal of pleural drainage catheter Right 04/12/2012    Procedure: MINOR REMOVAL OF PLEURAL DRAINAGE CATHETER;  Surgeon: Alleen Borne, MD;  Location: [facility] OR;  Service: Thoracic;  Laterality: Right;  . Talc pleurodesis Left 04/12/2012    Procedure: Lurlean Nanny;  Surgeon: Alleen Borne, MD;  Location: [facility] OR;  Service: Thoracic;  Laterality: Left;  . Portacath placement Left 04/17/2012    Procedure: INSERTION PORT-A-CATH;  Surgeon: Alleen Borne, MD;  Location: [facility] OR;  Service: Thoracic;  Laterality: Left;  . Removal of pleural drainage catheter Left 04/17/2012    Procedure: REMOVAL OF PLEURAL DRAINAGE CATHETER;  Surgeon: Alleen Borne, MD;  Location: [facility] OR;  Service: Thoracic;  Laterality: Left;  . Port-a-cath removal Left 04/17/2012    Procedure: REMOVAL PORT-A-CATH;  Surgeon: Alleen Borne, MD;  Location: [facility] OR;  Service: Thoracic;  Laterality: Left;  . Cardiac catheterization    . Laparotomy Bilateral 05/30/2012    Procedure: Resection of umbilical mass, Partial omentectomy;  Surgeon: Jeannette Corpus, MD;  Location: [facility] ORS;  Service: Gynecology;  Laterality: Bilateral;    REVIEW OF SYSTEMS:   General: fatigue (+), night sweats (-), fever (-), pain (-) Lymph: palpable nodes (-) HEENT: vision changes (-), mucositis (-), gum bleeding (-), epistaxis (-) Cardiovascular: chest pain (-), palpitations (-) Pulmonary: shortness of breath (-), dyspnea on exertion (-), cough (-), hemoptysis (-) GI:  Early satiety (-), melena (-), dysphagia (-), nausea/vomiting (-), diarrhea (-) GU: dysuria (-), hematuria (-), incontinence (-) Musculoskeletal: joint swelling (-), joint pain (-), back pain (-) Neuro: weakness (-), numbness (+), headache (-), confusion (-) Skin: Rash (-), lesions (-), dryness (-) Psych: depression (-), suicidal/homicidal ideation (-), feeling of hopelessness (-)   PHYSICAL EXAMINATION:  BP 115/69  Pulse 70  Temp(Src) 98.7 F (37.1 C) (Oral)  Resp 18  BP lying 112/74, HR 94, BP sitting 116/75, HR 95, BP standing 115/73, HR 105 General: Patient is an ill appearing female in no acute distress HEENT: PERRLA, sclerae anicteric no conjunctival pallor, MMM, fluid in tympanic membranes Neck: supple, no palpable adenopathy Lungs: clear to auscultation  bilaterally, no wheezes, rhonchi, or rales Cardiovascular: regular rate rhythm, S1, S2, no murmurs, rubs or gallops Abdomen: Soft, non-tender, non-distended, normoactive bowel sounds, no HSM, Extremities: warm and well perfused, no clubbing, cyanosis,  or edema Skin: No rashes or lesions Neuro: Non-focal ECOG PERFORMANCE STATUS: 0 - Asymptomatic  LABORATORY DATA: Lab Results  Component Value Date   WBC 2.5* 09/11/2012   HGB 9.6* 09/11/2012   HCT 30.1* 09/11/2012   MCV 87.8 09/11/2012   PLT 181 09/11/2012      Chemistry      Component Value Date/Time   NA 139 09/11/2012 1032   NA 134* 06/02/2012 0515   NA 141 12/17/2011 1144   K 3.9 09/11/2012 1032   K 4.4 06/02/2012 0515   CL 103 08/07/2012 1108   CL 100 06/02/2012 0515   CO2 19* 09/11/2012 1032   CO2 25 06/02/2012 0515   BUN 18.1 09/11/2012 1032   BUN 20 06/02/2012 0515   BUN 15 12/17/2011 1144   CREATININE 0.7 09/11/2012 1032   CREATININE 0.76 06/02/2012 0515      Component Value Date/Time   CALCIUM 9.0 09/11/2012 1032   CALCIUM 8.7 06/02/2012 0515   ALKPHOS 75 09/11/2012 1032   ALKPHOS 85 05/26/2012 0955   AST 14 09/11/2012 1032   AST 22 05/26/2012 0955   ALT 15 09/11/2012 1032   ALT 16 05/26/2012 0955   BILITOT 0.22 09/11/2012 1032   BILITOT 0.3 05/26/2012 0955       RADIOGRAPHIC STUDIES:  No results found.  ASSESSMENT: 67 year old female with:  #1 DCIS of the breast. She is on tamoxifen 20 mg daily. However I have recommended that we discontinue the tamoxifen for now a do to her recent diagnosis of ovarian cancer.  #2 recent diagnosis of gynecologic malignancy this is ovarian carcinoma). Patient is currently receiving neoadjuvant chemotherapy consisting of Taxol and carboplatinum. Overall she is tolerating it well. Recent CT performed on January 8 showed a minute response to chemotherapy. Total of 6 cycles of Taxol and carboplatinum was given.  #3 patient is now status post laparotomy performed on 05/30/2012. Unfortunately  intraoperatively she was found to have significant residual disease. 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 Patient and I discussed the pathology results today. We also discussed further treatment options. This would include systemic chemotherapy. The regimen gemcitabine and carboplatinum with Avastin is a possibility. However I would like to have her recover from her surgery the next few weeks. She is also scheduled to be seen by Dr. Grant Ruts.   #4 patient has had chemotherapy sensitivity testing performed unfortunately the results were inconclusive. We discussed the possibility of doing to topotecan and Avastin. This is discussed with the patient. Dr. Grant Ruts has also recommended this.   #5 patient was started topotecan and avastin  With topotecan q week on day 1, 8,15 on a 28 day cycle, avastin q 3 weeks  #6 depression/anxiety: Currently on Effexor with good response  #7 neutropenia resolved due to Neupogen  #8 dehydration  #9 cold sores/herpes labialis.  PLAN: #1patient will proceed with topo T10 today.  #2 shortness of breath I will obtain a chest x-ray.  #3 patient is counseled to hydrate herself aggressively.  #4 I will see her back in one week's time for followup.  All questions were answered. The patient knows to call the clinic with any problems, questions or concerns. We can certainly see the patient much sooner if necessary.  I spent 25 minutes counseling the patient face to face. The total time spent in the appointment was 30 minutes.  Drue Second, MD Medical/Oncology [facility] [phone]  beeper) [phone] (Office)   .

## 2012-09-25 ENCOUNTER — Ambulatory Visit (HOSPITAL_BASED_OUTPATIENT_CLINIC_OR_DEPARTMENT_OTHER): Payer: BC Managed Care – PPO | Admitting: Adult Health

## 2012-09-25 ENCOUNTER — Ambulatory Visit (HOSPITAL_BASED_OUTPATIENT_CLINIC_OR_DEPARTMENT_OTHER): Payer: Medicare Other

## 2012-09-25 ENCOUNTER — Encounter: Payer: Self-pay | Admitting: Adult Health

## 2012-09-25 ENCOUNTER — Other Ambulatory Visit (HOSPITAL_BASED_OUTPATIENT_CLINIC_OR_DEPARTMENT_OTHER): Payer: Medicare Other | Admitting: Lab

## 2012-09-25 VITALS — BP 118/71 | HR 74 | Temp 97.8°F | Resp 20 | Ht 66.0 in | Wt 149.9 lb

## 2012-09-25 DIAGNOSIS — F341 Dysthymic disorder: Secondary | ICD-10-CM

## 2012-09-25 DIAGNOSIS — C569 Malignant neoplasm of unspecified ovary: Secondary | ICD-10-CM

## 2012-09-25 DIAGNOSIS — D0590 Unspecified type of carcinoma in situ of unspecified breast: Secondary | ICD-10-CM

## 2012-09-25 DIAGNOSIS — B009 Herpesviral infection, unspecified: Secondary | ICD-10-CM

## 2012-09-25 DIAGNOSIS — Z5111 Encounter for antineoplastic chemotherapy: Secondary | ICD-10-CM

## 2012-09-25 LAB — COMPREHENSIVE METABOLIC PANEL (CC13)
Albumin: 3 g/dL — ABNORMAL LOW (ref 3.5–5.0)
BUN: 15.2 mg/dL (ref 7.0–26.0)
CO2: 25 mEq/L (ref 22–29)
Glucose: 87 mg/dl (ref 70–140)
Sodium: 143 mEq/L (ref 136–145)
Total Bilirubin: 0.28 mg/dL (ref 0.20–1.20)
Total Protein: 7 g/dL (ref 6.4–8.3)

## 2012-09-25 LAB — CBC WITH DIFFERENTIAL/PLATELET
BASO%: 0.6 % (ref 0.0–2.0)
Basophils Absolute: 0.1 10*3/uL (ref 0.0–0.1)
EOS%: 2.5 % (ref 0.0–7.0)
HCT: 32.3 % — ABNORMAL LOW (ref 34.8–46.6)
HGB: 9.9 g/dL — ABNORMAL LOW (ref 11.6–15.9)
LYMPH%: 17.8 % (ref 14.0–49.7)
MCH: 28.3 pg (ref 25.1–34.0)
MCHC: 30.7 g/dL — ABNORMAL LOW (ref 31.5–36.0)
MCV: 92.3 fL (ref 79.5–101.0)
NEUT%: 64 % (ref 38.4–76.8)
Platelets: 526 10*3/uL — ABNORMAL HIGH (ref 145–400)
lymph#: 1.4 10*3/uL (ref 0.9–3.3)

## 2012-09-25 MED ORDER — HEPARIN SOD (PORK) LOCK FLUSH 100 UNIT/ML IV SOLN
500.0000 [IU] | Freq: Once | INTRAVENOUS | Status: AC | PRN
  Administered 2012-09-25: 500 [IU]
  Filled 2012-09-25: qty 5

## 2012-09-25 MED ORDER — SODIUM CHLORIDE 0.9 % IV SOLN
Freq: Once | INTRAVENOUS | Status: AC
  Administered 2012-09-25: 14:00:00 via INTRAVENOUS

## 2012-09-25 MED ORDER — SODIUM CHLORIDE 0.9 % IJ SOLN
10.0000 mL | INTRAMUSCULAR | Status: DC | PRN
  Administered 2012-09-25: 10 mL
  Filled 2012-09-25: qty 10

## 2012-09-25 MED ORDER — ONDANSETRON 8 MG/50ML IVPB (CHCC)
8.0000 mg | Freq: Once | INTRAVENOUS | Status: AC
  Administered 2012-09-25: 8 mg via INTRAVENOUS

## 2012-09-25 MED ORDER — TOPOTECAN HCL CHEMO INJECTION 4 MG
4.0000 mg/m2 | Freq: Once | INTRAVENOUS | Status: AC
  Administered 2012-09-25: 7 mg via INTRAVENOUS
  Filled 2012-09-25: qty 7

## 2012-09-25 MED ORDER — DEXAMETHASONE SODIUM PHOSPHATE 10 MG/ML IJ SOLN
10.0000 mg | Freq: Once | INTRAMUSCULAR | Status: AC
  Administered 2012-09-25: 10 mg via INTRAVENOUS

## 2012-09-25 NOTE — Progress Notes (Signed)
[facility]  [phone] [phone]  OFFICE PROGRESS NOTE   PCP: Elesa Massed, M.D. SU: Emelia Loron, M.D. RAD ONC: Antony Blackbird, M.D.   DIAGNOSIS: [name] [name] is a 67 year-old female with a history of ductal carcinoma in situ of the left breast diagnosed 04/2009 and ovarian cancer diagnosed in 12/2011.   PRIOR THERAPY: #1. 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 Patient had 3 sentinel biopsied all of them were negative for metastatic disease. Patient underwent radiation therapy between 06/05/2009 through 07/02/2009. She was then begun on Tamoxifen 20 mg daily in 09/2009.  #2  In 12/2011 she received the diagnosis of gynecologic malignancy presenting with abdominal mass and pleural effusions. Patient was originally hospitalized with shortness of breath in 12/2011.  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 She also had malignant ascites.  She underwent bilateral thoracentesis and paracentesis procedures during her hospitalization. During her hospitalization she was seen by gynecologic oncology.  #3 Patient began neoadjuvant chemotherapy consisting of Taxol and Carboplatinum. Her first cycle was administered during her hospitalization. She has completed a total of 6 cycles of Taxol/Carboplatinum from 01/07/2012 - 06/23/2012. Overall she tolerated it well except for some fatigue and neuropathies.  #4 Patient is status post laparotomy in 05/2012 that revealed significant residual disease.   #5  Patient began adjuvant therapy with Topotecan/Avastin beginning on 07/24/2012.   CURRENT THERAPY: Topotecan/Avastin therapy   INTERVAL HISTORY:  [name] 67 y.o. female returns today for followup of ovarian cancer.  She is accompanied for today's office visit by her husband [name] [name].  She is doing  well today.  She recently went to the beach and is returning to receive treatment.  She denies fevers, chills, nausea, vomiting, constipation, numbness or any further concerns.  She feels this treatment is more tolerable than the taxol/carbo she received earlier.  A 10 point ROS is negative.       MEDICAL HISTORY: Past Medical History  Diagnosis Date  . Interstitial cystitis     on chronic antibiotics  . Hyperlipidemia   . Frequent UTI     on prophylaxis  . 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 CAD (coronary artery disease)     a. s/p CABG 7/13;   b.  LHC 12/01/11:  pLAD 70%, mLAD 40%, CFX 40-50% prior to takeoff of the OM2, oRCA occluded, mid vessel filled via R->R collaterals and distal vessel filled by L->R collaterals, S-OM1/OM2 (small and diffusely dz) with mid 90% stenosis, 90% at OM1 anastomotic site, continuation of OM2 occluded, S-Dx patent, S-PDA occluded, L-LAD ok, EF 55-65%  => Med Rx rec.  [name] Hx of echocardiogram     Echo 5/13: EF 60-65%, grade 2 diastolic dysfunction  . Hypercholesterolemia   . Pleural effusion 12/28/11    s/p pleurx catheter  . GERD (gastroesophageal reflux disease)   . Arthritis     "just a little; lower back" (12/28/11)  . Gout attack 08/2011    related to "stress post OHS"  . Depression   . Breast cancer     "left"; on Tamoxifen  . S/P thoracentesis 12/28/11    "for pleural effusion" (12/28/2011)  . Ovarian cancer   . Tachycardia   . Neuromuscular disorder     peripheral neuropathy    ALLERGIES:  is allergic to codeine; isosorbide; other; phenothiazines; talwin; yellow jacket  venom; and ambien.  MEDICATIONS:  Current Outpatient Prescriptions  Medication Sig Dispense Refill  . ALPRAZolam (XANAX) 0.25 MG tablet Take 1 tablet (0.25 mg total) by mouth every 8 (eight) hours as needed for anxiety.  30 tablet  4  . Alum & Mag Hydroxide-Simeth (MAGIC MOUTHWASH) SOLN Take 5 mLs by mouth 4 (four) times daily as needed (mouth sores).      [name] aspirin 325 MG  tablet Take 325 mg by mouth at bedtime.       [name] atorvastatin (LIPITOR) 20 MG tablet TAKE ONE TABLET BY MOUTH ONCE DAILY  90 tablet  3  . carvedilol (COREG) 25 MG tablet Take 1 tablet (25 mg total) by mouth 2 (two) times daily.  180 tablet  3  . dexamethasone (DECADRON) 4 MG tablet Take 2 tablets (8 mg total) by mouth 2 (two) times daily with a meal. Take daily starting the day after chemotherapy for 2 days. Take with food.  30 tablet  1  . docusate sodium (COLACE) 100 MG capsule Take 200 mg by mouth daily.      [name] EPINEPHrine (EPIPEN 2-PAK) 0.3 mg/0.3 mL DEVI Inject 0.3 mg into the muscle daily as needed (allergic reaction).       . fluticasone (FLONASE) 50 MCG/ACT nasal spray Place 2 sprays into the nose daily as needed for allergies.      [name] gabapentin (NEURONTIN) 100 MG capsule Take 100 mg orally three times a day  90 capsule  6  . HYDROcodone-acetaminophen (NORCO/VICODIN) 5-325 MG per tablet Take 1-2 tablets by mouth every 4 (four) hours as needed.  40 tablet  2  . lidocaine-prilocaine (EMLA) cream Apply topically as needed.  30 g  1  . LORazepam (ATIVAN) 0.5 MG tablet Take 1 tablet (0.5 mg total) by mouth every 6 (six) hours as needed (Nausea or vomiting).  30 tablet  0  . nitroGLYCERIN (NITROSTAT) 0.4 MG SL tablet Place 0.4 mg under the tongue every 5 (five) minutes as needed. Chest pain      . omeprazole (PRILOSEC) 20 MG capsule Take 1 capsule (20 mg total) by mouth 2 (two) times daily.  60 capsule  12  . ondansetron (ZOFRAN) 8 MG tablet Take 1 tablet (8 mg total) by mouth 2 (two) times daily. Take two times a day starting the day after chemo for 2 days. Then take two times a day as needed for nausea or vomiting.  30 tablet  1  . oxybutynin (DITROPAN-XL) 10 MG 24 hr tablet Take 10 mg by mouth daily.      Bertram Gala Glycol-Propyl Glycol 0.4-0.3 % SOLN Place 1 drop into both eyes every morning.       . prochlorperazine (COMPAZINE) 10 MG tablet Take 1 tablet (10 mg total) by mouth every 6 (six)  hours as needed (Nausea or vomiting).  30 tablet  1  . prochlorperazine (COMPAZINE) 25 MG suppository Place 1 suppository (25 mg total) rectally every 12 (twelve) hours as needed for nausea.  12 suppository  3  . SF 5000 PLUS 1.1 % CREA dental cream Place 1.1 % onto teeth at bedtime.       [name] trimethoprim (TRIMPEX) 100 MG tablet Take 100 mg by mouth every morning.       . valACYclovir (VALTREX) 500 MG tablet Take 1 tablet (500 mg total) by mouth daily.  30 tablet  7  . venlafaxine XR (EFFEXOR-XR) 75 MG 24 hr capsule Take 75 mg  Orally daily  30 capsule  8   No current facility-administered medications for this visit.   Facility-Administered Medications Ordered in Other Visits  Medication Dose Route Frequency Provider Last Rate Last Dose  . influenza  inactive virus vaccine (FLUZONE/FLUARIX) injection 0.5 mL  0.5 mL Intramuscular Once Joselyn Arrow, MD        SURGICAL HISTORY:  Past Surgical History  Procedure Laterality Date  . Breast lumpectomy  04/2009    left  . Cesarean section  1973; 1976  . Muscle release  1960    L neck and chest.; "when I was 13; pneumonia settled in my left neck"  . Coronary artery bypass graft  08/18/2011    Procedure: CORONARY ARTERY BYPASS GRAFTING (CABG);  Surgeon: Alleen Borne, MD;  [facility];  Service: Open Heart Surgery;  Laterality: N/A;  Coronary Artery Bypass Graft times five utilizing the left internal mammary artery and the left greater saphenous vein harvested endoscopically.  . Abdominal hysterectomy  1976  . Appendectomy  1976  . Portacath placement  01/06/2012    Procedure: INSERTION PORT-A-CATH;  Surgeon: Alleen Borne, MD;  Location: [facility] OR;  Service: Thoracic;  Laterality: Left;  . Chest tube insertion  01/06/2012    Procedure: INSERTION PLEURAL DRAINAGE CATHETER;  Surgeon: Alleen Borne, MD;  Location: [facility] OR;  Service: Thoracic;  Laterality: Bilateral;  . Removal of pleural drainage catheter Right 04/12/2012    Procedure: MINOR REMOVAL OF  PLEURAL DRAINAGE CATHETER;  Surgeon: Alleen Borne, MD;  Location: [facility] OR;  Service: Thoracic;  Laterality: Right;  . Talc pleurodesis Left 04/12/2012    Procedure: Lurlean Nanny;  Surgeon: Alleen Borne, MD;  Location: [facility] OR;  Service: Thoracic;  Laterality: Left;  . Portacath placement Left 04/17/2012    Procedure: INSERTION PORT-A-CATH;  Surgeon: Alleen Borne, MD;  Location: [facility] OR;  Service: Thoracic;  Laterality: Left;  . Removal of pleural drainage catheter Left 04/17/2012    Procedure: REMOVAL OF PLEURAL DRAINAGE CATHETER;  Surgeon: Alleen Borne, MD;  Location: [facility] OR;  Service: Thoracic;  Laterality: Left;  . Port-a-cath removal Left 04/17/2012    Procedure: REMOVAL PORT-A-CATH;  Surgeon: Alleen Borne, MD;  Location: [facility] OR;  Service: Thoracic;  Laterality: Left;  . Cardiac catheterization    . Laparotomy Bilateral 05/30/2012    Procedure: Resection of umbilical mass, Partial omentectomy;  Surgeon: Jeannette Corpus, MD;  Location: [facility] ORS;  Service: Gynecology;  Laterality: Bilateral;    REVIEW OF SYSTEMS:   A 10 point review of systems was completed and is negative except as noted above.  PHYSICAL EXAMINATION:  BP 118/71  Pulse 74  Temp(Src) 97.8 F (36.6 C) (Oral)  Resp 20  Ht 5\' 6"  (1.676 m)  Wt 149 lb 14.4 oz (67.994 kg)  BMI 24.21 kg/m2   General appearance: Alert, cooperative, thin frame no apparent distress Head: Normocephalic, without obvious abnormality, atraumatic Eyes: Arcus senilis, PERRLA, EOMI Nose: Nares, septum and mucosa are normal, no drainage or sinus tenderness Neck: No adenopathy, supple, symmetrical, trachea midline, no tenderness Resp: Clear to auscultation bilaterally Cardio: Regular rate and rhythm, S1, S2 normal, 1/6 murmur, no click, rub or gallop, left chest Port-A-Cath covered and EMLA cream Breasts:  Deferred GI: Soft, slight distention, non-tender, hypoactive bowel sounds, no organomegaly Extremities: Extremities normal, atraumatic, no  cyanosis or edema Lymph nodes: Cervical and supraclavicular lymph nodes are normal Neurologic: Grossly normal ECOG PERFORMANCE STATUS: 1 - Symptomatic but completely ambulatory.   LABORATORY DATA: Lab Results  Component Value Date   WBC 8.0 09/25/2012   HGB 9.9* 09/25/2012   HCT 32.3* 09/25/2012   MCV 92.3 09/25/2012   PLT 526* 09/25/2012      Chemistry      Component Value Date/Time   NA 143 09/25/2012 1143   NA 134* 06/02/2012 0515   NA 141 12/17/2011 1144   K 4.4 09/25/2012 1143   K 4.4 06/02/2012 0515   CL 103 08/07/2012 1108   CL 100 06/02/2012 0515   CO2 25 09/25/2012 1143   CO2 25 06/02/2012 0515   BUN 15.2 09/25/2012 1143   BUN 20 06/02/2012 0515   BUN 15 12/17/2011 1144   CREATININE 0.8 09/25/2012 1143   CREATININE 0.76 06/02/2012 0515      Component Value Date/Time   CALCIUM 9.4 09/25/2012 1143   CALCIUM 8.7 06/02/2012 0515   ALKPHOS 80 09/25/2012 1143   ALKPHOS 85 05/26/2012 0955   AST 24 09/25/2012 1143   AST 22 05/26/2012 0955   ALT 20 09/25/2012 1143   ALT 16 05/26/2012 0955   BILITOT 0.28 09/25/2012 1143   BILITOT 0.3 05/26/2012 0955       RADIOGRAPHIC STUDIES: Dg Chest 2 View 08/30/2012   *RADIOLOGY REPORT*  Clinical Data: Shortness of breath, history metastatic ovarian carcinoma and history of bilateral pleural effusions.  CHEST - 2 VIEW  Comparison: 05/26/2012  Findings: There is significant diminishment in bilateral pleural effusions since the prior study with only trace amount of pleural fluid present bilaterally.  The lungs show no evidence of focal consolidation, masses or pulmonary edema.  Port-A-Cath positioning stable in the SVC.  The heart size is normal.  No bony lesions are seen.  IMPRESSION: Significant diminishment in bilateral pleural effusions since the prior chest x-ray. Trace bilateral pleural effusions remain.  No acute findings.   Original Report Authenticated By: Irish Lack, M.D.     ASSESSMENT: 67 year old woman:   #1 History of DCIS of the left  breast status post lumpectomy in 04/2009.  Status post radiation therapy completed in 06/2009.  Was on antiestrogen therapy with Tamoxifen 20 mg by mouth daily that was started in 09/2009.  Tamoxifen has been discontinued for now due to her recent diagnosis of ovarian cancer.  #2 Gynecologic malignancy (ovarian carcinoma) diagnosed in 12/2011. Patient received 6 cycles of neoadjuvant chemotherapy consisting of Taxol/Carboplatinum completed in 06/2012.  #3 She is now status post laparotomy performed on 05/30/2012. Unfortunately, intraoperatively she was found to have significant residual disease. 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   #4 Patient completed chemotherapy sensitivity testing. Unfortunately the results were inconclusive.  It was decided to proceed with chemotherapy consisting of Topotecan and Avastin.  Dr. Grant Ruts also recommended this treatment regimen.  #5 Patient was started chemotherapy consisting of Topotecan and Avastin  With Topotecan q week on day 1, 8,15 on a 28 day cycle, and Avastin scheduled for every 3 weeks.  #6 Depression/anxiety: Currently on Effexor with good response  #7 Nutropenia resolved due to Neupogen  #8 History of dehydration -patient instructed to drink plenty of water  #9  Cold sores/herpes labialis.  #10 Neuropathy - currently on Gabapentin 100 mg by mouth 3 times a day.   PLAN: #1 Today the patient will proceed with treatment today.  Doing well.    #2 For mouth sores she will continue taking Valacyclovir 500 mg by mouth daily.   #3 We plan to  see Mrs. Luster again on 10/02/2012 at which time we will check laboratories of CBC and CMP.     All questions were answered.  Mr. and Mrs. Karp were encouraged to contact us with any questions, problems or concerns.   I spent 25 minutes counseling the patient face to face.  The total time spent in the  appointment was 30 minutes.   Cherie Ouch Lyn Hollingshead, NP Medical Oncology [facility] Phone: [phone] 09/25/2012  9:31 PM

## 2012-09-25 NOTE — Patient Instructions (Addendum)
Doing well.  Proceed with treatment.  Please call us if you have any questions or concerns.

## 2012-09-25 NOTE — Patient Instructions (Signed)
[facility] Discharge Instructions for Patients Receiving Chemotherapy  Today you received the following chemotherapy agents: hycamtin  To help prevent nausea and vomiting after your treatment, we encourage you to take your nausea medication.  Take it as often as prescribed.     If you develop nausea and vomiting that is not controlled by your nausea medication, call the clinic. If it is after clinic hours your family physician or the after hours number for the clinic or go to the Emergency Department.   BELOW ARE SYMPTOMS THAT SHOULD BE REPORTED IMMEDIATELY:  *FEVER GREATER THAN 100.5 F  *CHILLS WITH OR WITHOUT FEVER  NAUSEA AND VOMITING THAT IS NOT CONTROLLED WITH YOUR NAUSEA MEDICATION  *UNUSUAL SHORTNESS OF BREATH  *UNUSUAL BRUISING OR BLEEDING  TENDERNESS IN MOUTH AND THROAT WITH OR WITHOUT PRESENCE OF ULCERS  *URINARY PROBLEMS  *BOWEL PROBLEMS  UNUSUAL RASH Items with * indicate a potential emergency and should be followed up as soon as possible.  Feel free to call the clinic you have any questions or concerns. The clinic phone number is [phone].   I have been informed and understand all the instructions given to me. I know to contact the clinic, my physician, or go to the Emergency Department if any problems should occur. I do not have any questions at this time, but understand that I may call the clinic during office hours   should I have any questions or need assistance in obtaining follow up care.    __________________________________________  _____________  __________ Signature of Patient or Authorized Representative            Date                   Time    __________________________________________ Nurse's Signature

## 2012-10-02 ENCOUNTER — Ambulatory Visit (HOSPITAL_BASED_OUTPATIENT_CLINIC_OR_DEPARTMENT_OTHER): Payer: Medicare Other | Admitting: Adult Health

## 2012-10-02 ENCOUNTER — Encounter: Payer: Self-pay | Admitting: Adult Health

## 2012-10-02 ENCOUNTER — Telehealth: Payer: Self-pay | Admitting: *Deleted

## 2012-10-02 ENCOUNTER — Other Ambulatory Visit (HOSPITAL_BASED_OUTPATIENT_CLINIC_OR_DEPARTMENT_OTHER): Payer: Medicare Other | Admitting: Lab

## 2012-10-02 ENCOUNTER — Ambulatory Visit (HOSPITAL_BASED_OUTPATIENT_CLINIC_OR_DEPARTMENT_OTHER): Payer: Medicare Other

## 2012-10-02 ENCOUNTER — Telehealth: Payer: Self-pay | Admitting: Oncology

## 2012-10-02 VITALS — BP 117/62

## 2012-10-02 VITALS — BP 106/70 | HR 78 | Temp 97.4°F | Resp 18 | Ht 66.0 in | Wt 146.5 lb

## 2012-10-02 DIAGNOSIS — C569 Malignant neoplasm of unspecified ovary: Secondary | ICD-10-CM

## 2012-10-02 DIAGNOSIS — Z853 Personal history of malignant neoplasm of breast: Secondary | ICD-10-CM

## 2012-10-02 DIAGNOSIS — G609 Hereditary and idiopathic neuropathy, unspecified: Secondary | ICD-10-CM

## 2012-10-02 DIAGNOSIS — Z5111 Encounter for antineoplastic chemotherapy: Secondary | ICD-10-CM

## 2012-10-02 DIAGNOSIS — F411 Generalized anxiety disorder: Secondary | ICD-10-CM

## 2012-10-02 DIAGNOSIS — Z5112 Encounter for antineoplastic immunotherapy: Secondary | ICD-10-CM

## 2012-10-02 LAB — COMPREHENSIVE METABOLIC PANEL (CC13)
ALT: 26 U/L (ref 0–55)
AST: 19 U/L (ref 5–34)
CO2: 24 mEq/L (ref 22–29)
Chloride: 106 mEq/L (ref 98–109)
Sodium: 140 mEq/L (ref 136–145)
Total Bilirubin: 0.43 mg/dL (ref 0.20–1.20)
Total Protein: 7 g/dL (ref 6.4–8.3)

## 2012-10-02 LAB — UA PROTEIN, DIPSTICK - CHCC: Protein, ur: NEGATIVE mg/dL

## 2012-10-02 LAB — CBC WITH DIFFERENTIAL/PLATELET
Basophils Absolute: 0 10*3/uL (ref 0.0–0.1)
EOS%: 3.8 % (ref 0.0–7.0)
Eosinophils Absolute: 0.2 10*3/uL (ref 0.0–0.5)
HGB: 9.5 g/dL — ABNORMAL LOW (ref 11.6–15.9)
NEUT#: 4.6 10*3/uL (ref 1.5–6.5)
RDW: 21.2 % — ABNORMAL HIGH (ref 11.2–14.5)
lymph#: 1.1 10*3/uL (ref 0.9–3.3)
nRBC: 0 % (ref 0–0)

## 2012-10-02 MED ORDER — TOPOTECAN HCL CHEMO INJECTION 4 MG
4.0000 mg/m2 | Freq: Once | INTRAVENOUS | Status: AC
  Administered 2012-10-02: 7 mg via INTRAVENOUS
  Filled 2012-10-02: qty 7

## 2012-10-02 MED ORDER — DEXAMETHASONE SODIUM PHOSPHATE 10 MG/ML IJ SOLN
10.0000 mg | Freq: Once | INTRAMUSCULAR | Status: AC
  Administered 2012-10-02: 10 mg via INTRAVENOUS

## 2012-10-02 MED ORDER — HEPARIN SOD (PORK) LOCK FLUSH 100 UNIT/ML IV SOLN
500.0000 [IU] | Freq: Once | INTRAVENOUS | Status: AC | PRN
  Administered 2012-10-02: 500 [IU]
  Filled 2012-10-02: qty 5

## 2012-10-02 MED ORDER — ONDANSETRON 8 MG/50ML IVPB (CHCC)
8.0000 mg | Freq: Once | INTRAVENOUS | Status: AC
  Administered 2012-10-02: 8 mg via INTRAVENOUS

## 2012-10-02 MED ORDER — SODIUM CHLORIDE 0.9 % IJ SOLN
10.0000 mL | INTRAMUSCULAR | Status: DC | PRN
  Administered 2012-10-02: 10 mL
  Filled 2012-10-02: qty 10

## 2012-10-02 MED ORDER — SODIUM CHLORIDE 0.9 % IV SOLN
15.0000 mg/kg | Freq: Once | INTRAVENOUS | Status: AC
  Administered 2012-10-02: 1025 mg via INTRAVENOUS
  Filled 2012-10-02: qty 41

## 2012-10-02 MED ORDER — SODIUM CHLORIDE 0.9 % IV SOLN
Freq: Once | INTRAVENOUS | Status: AC
  Administered 2012-10-02: 11:00:00 via INTRAVENOUS

## 2012-10-02 NOTE — Patient Instructions (Addendum)
Doing well.  Proceed with chemotherapy.  Please call us if you have any questions or concerns.    

## 2012-10-02 NOTE — Progress Notes (Signed)
[facility]  Telephone:([phone] [phone]) [phone]  OFFICE PROGRESS NOTE   PCP: [name] [name], M.D. SU: [name] [name], M.D. RAD ONC: [name] [name], M.D.   DIAGNOSIS: [name] [name] is a 67 year-old female with a history of ductal carcinoma in situ of the left breast diagnosed 04/2009 and ovarian cancer diagnosed in 12/2011.   PRIOR THERAPY: #1. 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 Patient had 3 sentinel biopsied all of them were negative for metastatic disease. Patient underwent radiation therapy between 06/05/2009 through 07/02/2009. She was then begun on Tamoxifen 20 mg daily in 09/2009.  #2  In 12/2011 she received the diagnosis of gynecologic malignancy presenting with abdominal mass and pleural effusions. Patient was originally hospitalized with shortness of breath in 12/2011.  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 She also had malignant ascites.  She underwent bilateral thoracentesis and paracentesis procedures during her hospitalization. During her hospitalization she was seen by gynecologic oncology.  #3 Patient began neoadjuvant chemotherapy consisting of Taxol and Carboplatinum. Her first cycle was administered during her hospitalization. She has completed a total of 6 cycles of Taxol/Carboplatinum from 01/07/2012 - 06/23/2012. Overall she tolerated it well except for some fatigue and neuropathies.  #4 Patient is status post laparotomy in 05/2012 that revealed significant residual disease.   #5  Patient began adjuvant therapy with Topotecan/Avastin beginning on 07/24/2012.   CURRENT THERAPY: Topotecan/Avastin therapy cycle 3 day 8   INTERVAL HISTORY:  [name] 67 y.o. female returns today for followup of ovarian cancer.   She had mucositis over the weekend which was relieved with  magic mouthwash, ginger tea, and Valtrex taken as prescribed.  Her neuropathy is stable with Gabapentin TID.  She denies fevers, chills, nausea, vomiting, constipation, diarrhea, or any further concerns. She has questions about death, dying and hospice today since her husband isn't with her at her appointment, she would like to be informed about what is in the future.  A 10 point ROS is negative.       MEDICAL HISTORY: Past Medical History  Diagnosis Date  . Interstitial cystitis     on chronic antibiotics  . Hyperlipidemia   . Frequent UTI     on prophylaxis  . 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 CAD (coronary artery disease)     a. s/p CABG 7/13;   b.  LHC 12/01/11:  pLAD 70%, mLAD 40%, CFX 40-50% prior to takeoff of the OM2, oRCA occluded, mid vessel filled via R->R collaterals and distal vessel filled by L->R collaterals, S-OM1/OM2 (small and diffusely dz) with mid 90% stenosis, 90% at OM1 anastomotic site, continuation of OM2 occluded, S-Dx patent, S-PDA occluded, L-LAD ok, EF 55-65%  => Med Rx rec.  [name] Hx of echocardiogram     Echo 5/13: EF 60-65%, grade 2 diastolic dysfunction  . Hypercholesterolemia   . Pleural effusion 12/28/11    s/p pleurx catheter  . GERD (gastroesophageal reflux disease)   . Arthritis     "just a little; lower back" (12/28/11)  . Gout attack 08/2011    related to "stress post OHS"  . Depression   . Breast cancer     "left"; on Tamoxifen  . S/P thoracentesis 12/28/11    "for pleural effusion" (12/28/2011)  . Ovarian cancer   . Tachycardia   . Neuromuscular disorder  peripheral neuropathy    ALLERGIES:  is allergic to codeine; isosorbide; other; phenothiazines; talwin; yellow jacket venom; and ambien.  MEDICATIONS:  Current Outpatient Prescriptions  Medication Sig Dispense Refill  . ALPRAZolam (XANAX) 0.25 MG tablet Take 1 tablet (0.25 mg total) by mouth every 8 (eight) hours as needed for anxiety.  30 tablet  4  . Alum & Mag Hydroxide-Simeth (MAGIC  MOUTHWASH) SOLN Take 5 mLs by mouth 4 (four) times daily as needed (mouth sores).      [name] aspirin 325 MG tablet Take 325 mg by mouth at bedtime.       [name] atorvastatin (LIPITOR) 20 MG tablet TAKE ONE TABLET BY MOUTH ONCE DAILY  90 tablet  3  . carvedilol (COREG) 25 MG tablet Take 1 tablet (25 mg total) by mouth 2 (two) times daily.  180 tablet  3  . dexamethasone (DECADRON) 4 MG tablet Take 2 tablets (8 mg total) by mouth 2 (two) times daily with a meal. Take daily starting the day after chemotherapy for 2 days. Take with food.  30 tablet  1  . docusate sodium (COLACE) 100 MG capsule Take 200 mg by mouth daily.      [name] EPINEPHrine (EPIPEN 2-PAK) 0.3 mg/0.3 mL DEVI Inject 0.3 mg into the muscle daily as needed (allergic reaction).       . fluticasone (FLONASE) 50 MCG/ACT nasal spray Place 2 sprays into the nose daily as needed for allergies.      [name] gabapentin (NEURONTIN) 100 MG capsule Take 100 mg orally three times a day  90 capsule  6  . HYDROcodone-acetaminophen (NORCO/VICODIN) 5-325 MG per tablet Take 1-2 tablets by mouth every 4 (four) hours as needed.  40 tablet  2  . lidocaine-prilocaine (EMLA) cream Apply topically as needed.  30 g  1  . LORazepam (ATIVAN) 0.5 MG tablet Take 1 tablet (0.5 mg total) by mouth every 6 (six) hours as needed (Nausea or vomiting).  30 tablet  0  . nitroGLYCERIN (NITROSTAT) 0.4 MG SL tablet Place 0.4 mg under the tongue every 5 (five) minutes as needed. Chest pain      . omeprazole (PRILOSEC) 20 MG capsule Take 1 capsule (20 mg total) by mouth 2 (two) times daily.  60 capsule  12  . ondansetron (ZOFRAN) 8 MG tablet Take 1 tablet (8 mg total) by mouth 2 (two) times daily. Take two times a day starting the day after chemo for 2 days. Then take two times a day as needed for nausea or vomiting.  30 tablet  1  . oxybutynin (DITROPAN-XL) 10 MG 24 hr tablet Take 10 mg by mouth daily.      Bertram Gala Glycol-Propyl Glycol 0.4-0.3 % SOLN Place 1 drop into both eyes every morning.        . prochlorperazine (COMPAZINE) 10 MG tablet Take 1 tablet (10 mg total) by mouth every 6 (six) hours as needed (Nausea or vomiting).  30 tablet  1  . prochlorperazine (COMPAZINE) 25 MG suppository Place 1 suppository (25 mg total) rectally every 12 (twelve) hours as needed for nausea.  12 suppository  3  . SF 5000 PLUS 1.1 % CREA dental cream Place 1.1 % onto teeth at bedtime.       [name] trimethoprim (TRIMPEX) 100 MG tablet Take 100 mg by mouth every morning.       . valACYclovir (VALTREX) 500 MG tablet Take 1 tablet (500 mg total) by mouth daily.  30 tablet  7  .  venlafaxine XR (EFFEXOR-XR) 75 MG 24 hr capsule Take 75 mg  Orally daily  30 capsule  8   No current facility-administered medications for this visit.   Facility-Administered Medications Ordered in Other Visits  Medication Dose Route Frequency Provider Last Rate Last Dose  . influenza  inactive virus vaccine (FLUZONE/FLUARIX) injection 0.5 mL  0.5 mL Intramuscular Once Joselyn Arrow, MD        SURGICAL HISTORY:  Past Surgical History  Procedure Laterality Date  . Breast lumpectomy  04/2009    left  . Cesarean section  1973; 1976  . Muscle release  1960    L neck and chest.; "when I was 13; pneumonia settled in my left neck"  . Coronary artery bypass graft  08/18/2011    Procedure: CORONARY ARTERY BYPASS GRAFTING (CABG);  Surgeon: Alleen Borne, MD;  Location: [facility];  Service: Open Heart Surgery;  Laterality: N/A;  Coronary Artery Bypass Graft times five utilizing the left internal mammary artery and the left greater saphenous vein harvested endoscopically.  . Abdominal hysterectomy  1976  . Appendectomy  1976  . Portacath placement  01/06/2012    Procedure: INSERTION PORT-A-CATH;  Surgeon: Alleen Borne, MD;  Location: [facility] OR;  Service: Thoracic;  Laterality: Left;  . Chest tube insertion  01/06/2012    Procedure: INSERTION PLEURAL DRAINAGE CATHETER;  Surgeon: Alleen Borne, MD;  Location: [facility] OR;  Service: Thoracic;  Laterality:  Bilateral;  . Removal of pleural drainage catheter Right 04/12/2012    Procedure: MINOR REMOVAL OF PLEURAL DRAINAGE CATHETER;  Surgeon: Alleen Borne, MD;  Location: [facility] OR;  Service: Thoracic;  Laterality: Right;  . Talc pleurodesis Left 04/12/2012    Procedure: Lurlean Nanny;  Surgeon: Alleen Borne, MD;  Location: [facility] OR;  Service: Thoracic;  Laterality: Left;  . Portacath placement Left 04/17/2012    Procedure: INSERTION PORT-A-CATH;  Surgeon: Alleen Borne, MD;  Location: [facility] OR;  Service: Thoracic;  Laterality: Left;  . Removal of pleural drainage catheter Left 04/17/2012    Procedure: REMOVAL OF PLEURAL DRAINAGE CATHETER;  Surgeon: Alleen Borne, MD;  Location: [facility] OR;  Service: Thoracic;  Laterality: Left;  . Port-a-cath removal Left 04/17/2012    Procedure: REMOVAL PORT-A-CATH;  Surgeon: Alleen Borne, MD;  Location: [facility] OR;  Service: Thoracic;  Laterality: Left;  . Cardiac catheterization    . Laparotomy Bilateral 05/30/2012    Procedure: Resection of umbilical mass, Partial omentectomy;  Surgeon: Jeannette Corpus, MD;  Location: [facility] ORS;  Service: Gynecology;  Laterality: Bilateral;    REVIEW OF SYSTEMS:   A 10 point review of systems was completed and is negative except as noted above.  PHYSICAL EXAMINATION:  BP 106/70  Pulse 78  Temp(Src) 97.4 F (36.3 C) (Oral)  Resp 18  Ht 5\' 6"  (1.676 m)  Wt 146 lb 8 oz (66.452 kg)  BMI 23.66 kg/m2   General appearance: Alert, cooperative, thin frame no apparent distress Head: Normocephalic, without obvious abnormality, atraumatic Eyes: Arcus senilis, PERRLA, EOMI Nose: Nares, septum and mucosa are normal, no drainage or sinus tenderness Neck: No adenopathy, supple, symmetrical, trachea midline, no tenderness Resp: Clear to auscultation bilaterally Cardio: Regular rate and rhythm, S1, S2 normal, 1/6 murmur, no click, rub or gallop, left chest Port-A-Cath covered and EMLA cream Breasts:  Deferred GI: Soft, slight distention,  non-tender, hypoactive bowel sounds, no organomegaly Extremities: Extremities normal, atraumatic, no cyanosis or edema Lymph nodes: Cervical and supraclavicular lymph nodes are normal  Neurologic: Grossly normal ECOG PERFORMANCE STATUS: 1 - Symptomatic but completely ambulatory.   LABORATORY DATA: Lab Results  Component Value Date   WBC 6.3 10/02/2012   HGB 9.5* 10/02/2012   HCT 30.2* 10/02/2012   MCV 89.9 10/02/2012   PLT 240 10/02/2012      Chemistry      Component Value Date/Time   NA 143 09/25/2012 1143   NA 134* 06/02/2012 0515   NA 141 12/17/2011 1144   K 4.4 09/25/2012 1143   K 4.4 06/02/2012 0515   CL 103 08/07/2012 1108   CL 100 06/02/2012 0515   CO2 25 09/25/2012 1143   CO2 25 06/02/2012 0515   BUN 15.2 09/25/2012 1143   BUN 20 06/02/2012 0515   BUN 15 12/17/2011 1144   CREATININE 0.8 09/25/2012 1143   CREATININE 0.76 06/02/2012 0515      Component Value Date/Time   CALCIUM 9.4 09/25/2012 1143   CALCIUM 8.7 06/02/2012 0515   ALKPHOS 80 09/25/2012 1143   ALKPHOS 85 05/26/2012 0955   AST 24 09/25/2012 1143   AST 22 05/26/2012 0955   ALT 20 09/25/2012 1143   ALT 16 05/26/2012 0955   BILITOT 0.28 09/25/2012 1143   BILITOT 0.3 05/26/2012 0955       RADIOGRAPHIC STUDIES: Dg Chest 2 View 08/30/2012   *RADIOLOGY REPORT*  Clinical Data: Shortness of breath, history metastatic ovarian carcinoma and history of bilateral pleural effusions.  CHEST - 2 VIEW  Comparison: 05/26/2012  Findings: There is significant diminishment in bilateral pleural effusions since the prior study with only trace amount of pleural fluid present bilaterally.  The lungs show no evidence of focal consolidation, masses or pulmonary edema.  Port-A-Cath positioning stable in the SVC.  The heart size is normal.  No bony lesions are seen.  IMPRESSION: Significant diminishment in bilateral pleural effusions since the prior chest x-ray. Trace bilateral pleural effusions remain.  No acute findings.   Original Report Authenticated  By: Irish Lack, M.D.     ASSESSMENT: 67 year old woman:   #1 History of DCIS of the left breast status post lumpectomy in 04/2009.  Status post radiation therapy completed in 06/2009.  Was on antiestrogen therapy with Tamoxifen 20 mg by mouth daily that was started in 09/2009.  Tamoxifen has been discontinued for now due to her recent diagnosis of ovarian cancer.  #2 Gynecologic malignancy (ovarian carcinoma) diagnosed in 12/2011. Patient received 6 cycles of neoadjuvant chemotherapy consisting of Taxol/Carboplatinum completed in 06/2012.  #3 She is now status post laparotomy performed on 05/30/2012. Unfortunately, intraoperatively she was found to have significant residual disease. 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   #4 Patient completed chemotherapy sensitivity testing. Unfortunately the results were inconclusive.  It was decided to proceed with chemotherapy consisting of Topotecan and Avastin.  Dr. Grant Ruts also recommended this treatment regimen.  #5 Patient was started chemotherapy consisting of Topotecan and Avastin  With Topotecan q week on day 1, 8,15 on a 28 day cycle, and Avastin scheduled for every 3 weeks.  #6 Depression/anxiety: Currently on Effexor with good response  #7 Neutropenia resolved due to Neupogen  #8 History of dehydration -patient instructed to drink plenty of water  #9  Cold sores/herpes labialis.  #10 Neuropathy - currently on Gabapentin 100 mg by mouth 3 times a day.   PLAN: #1  Doing well.  Patient will proceed with treatment today with Topotecan/Avastin.  Gave patient  reassurance that she is not ready for hospice, but did give her information on the process of disease progression, physical decline leading to hospice and quality of life focus.    #2 She will continue Valtrex for her mouth and magic mouthwash as needed.   #3 She will return next week for  labs and day 15 of Topotecan.    All questions were answered.  Mr. and Mrs. Pepperman were encouraged to contact us with any questions, problems or concerns.   I spent 25 minutes counseling the patient face to face.  The total time spent in the appointment was 30 minutes.   Cherie Ouch Lyn Hollingshead, NP Medical Oncology [facility] Phone: [phone] 10/02/2012  10:20 AM

## 2012-10-02 NOTE — Telephone Encounter (Signed)
Per staff message and POF I have scheduled appts. No openings for 9/2 notified the scheduler to please advise new appt JMW

## 2012-10-02 NOTE — Patient Instructions (Signed)
[facility] Discharge Instructions for Patients Receiving Chemotherapy  Today you received the following chemotherapy agents : Bevacizumab & Hycamtin   To help prevent nausea and vomiting after your treatment, we encourage you to take your nausea medications:  Decadron 8 mg twice daily x 2 days and Zofran 8 mg twice daily x 2 days (then twice daily as needed)  Compazine 10 mg every 6 hours as needed for nausea  Ativan 0.5 mg every 6 hours as needed for nausea   If you develop nausea and vomiting that is not controlled by your nausea medication, call the clinic.   BELOW ARE SYMPTOMS THAT SHOULD BE REPORTED IMMEDIATELY:  *FEVER GREATER THAN 100.5 F  *CHILLS WITH OR WITHOUT FEVER  NAUSEA AND VOMITING THAT IS NOT CONTROLLED WITH YOUR NAUSEA MEDICATION  *UNUSUAL SHORTNESS OF BREATH  *UNUSUAL BRUISING OR BLEEDING  TENDERNESS IN MOUTH AND THROAT WITH OR WITHOUT PRESENCE OF ULCERS  *URINARY PROBLEMS  *BOWEL PROBLEMS  UNUSUAL RASH Items with * indicate a potential emergency and should be followed up as soon as possible.  Feel free to call the clinic you have any questions or concerns. The clinic phone number is [phone].  It has been a pleasure serving you today.

## 2012-10-05 ENCOUNTER — Telehealth: Payer: Self-pay | Admitting: *Deleted

## 2012-10-05 NOTE — Telephone Encounter (Signed)
Per staff message and POF I have scheduled appts.  JMW  

## 2012-10-06 ENCOUNTER — Telehealth: Payer: Self-pay | Admitting: Oncology

## 2012-10-09 ENCOUNTER — Telehealth: Payer: Self-pay | Admitting: *Deleted

## 2012-10-09 ENCOUNTER — Ambulatory Visit (HOSPITAL_BASED_OUTPATIENT_CLINIC_OR_DEPARTMENT_OTHER): Payer: Medicare Other

## 2012-10-09 ENCOUNTER — Other Ambulatory Visit (HOSPITAL_BASED_OUTPATIENT_CLINIC_OR_DEPARTMENT_OTHER): Payer: Medicare Other | Admitting: Lab

## 2012-10-09 ENCOUNTER — Ambulatory Visit (HOSPITAL_BASED_OUTPATIENT_CLINIC_OR_DEPARTMENT_OTHER): Payer: Medicare Other | Admitting: Adult Health

## 2012-10-09 ENCOUNTER — Encounter: Payer: Self-pay | Admitting: Adult Health

## 2012-10-09 VITALS — BP 99/63 | HR 88 | Temp 98.4°F | Resp 18 | Ht 66.0 in | Wt 149.1 lb

## 2012-10-09 DIAGNOSIS — F3289 Other specified depressive episodes: Secondary | ICD-10-CM

## 2012-10-09 DIAGNOSIS — G609 Hereditary and idiopathic neuropathy, unspecified: Secondary | ICD-10-CM

## 2012-10-09 DIAGNOSIS — C569 Malignant neoplasm of unspecified ovary: Secondary | ICD-10-CM

## 2012-10-09 DIAGNOSIS — Z5111 Encounter for antineoplastic chemotherapy: Secondary | ICD-10-CM

## 2012-10-09 DIAGNOSIS — F329 Major depressive disorder, single episode, unspecified: Secondary | ICD-10-CM

## 2012-10-09 LAB — CBC WITH DIFFERENTIAL/PLATELET
BASO%: 0.3 % (ref 0.0–2.0)
EOS%: 2.3 % (ref 0.0–7.0)
Eosinophils Absolute: 0.1 10*3/uL (ref 0.0–0.5)
MCH: 28.3 pg (ref 25.1–34.0)
MCHC: 30.9 g/dL — ABNORMAL LOW (ref 31.5–36.0)
MCV: 91.5 fL (ref 79.5–101.0)
MONO%: 3.3 % (ref 0.0–14.0)
NEUT#: 3 10*3/uL (ref 1.5–6.5)
RBC: 3.29 10*6/uL — ABNORMAL LOW (ref 3.70–5.45)
RDW: 21.1 % — ABNORMAL HIGH (ref 11.2–14.5)
nRBC: 5 % — ABNORMAL HIGH (ref 0–0)

## 2012-10-09 LAB — COMPREHENSIVE METABOLIC PANEL (CC13)
ALT: 23 U/L (ref 0–55)
AST: 19 U/L (ref 5–34)
Alkaline Phosphatase: 78 U/L (ref 40–150)
BUN: 17.5 mg/dL (ref 7.0–26.0)
Creatinine: 0.7 mg/dL (ref 0.6–1.1)
Total Bilirubin: 0.33 mg/dL (ref 0.20–1.20)

## 2012-10-09 LAB — CA 125: CA 125: 30.6 U/mL — ABNORMAL HIGH (ref 0.0–30.2)

## 2012-10-09 MED ORDER — SODIUM CHLORIDE 0.9 % IV SOLN
Freq: Once | INTRAVENOUS | Status: AC
  Administered 2012-10-09: 13:00:00 via INTRAVENOUS

## 2012-10-09 MED ORDER — ONDANSETRON 8 MG/50ML IVPB (CHCC)
8.0000 mg | Freq: Once | INTRAVENOUS | Status: AC
  Administered 2012-10-09: 8 mg via INTRAVENOUS

## 2012-10-09 MED ORDER — DEXAMETHASONE SODIUM PHOSPHATE 10 MG/ML IJ SOLN
10.0000 mg | Freq: Once | INTRAMUSCULAR | Status: AC
  Administered 2012-10-09: 10 mg via INTRAVENOUS

## 2012-10-09 MED ORDER — HEPARIN SOD (PORK) LOCK FLUSH 100 UNIT/ML IV SOLN
500.0000 [IU] | Freq: Once | INTRAVENOUS | Status: AC | PRN
  Administered 2012-10-09: 500 [IU]
  Filled 2012-10-09: qty 5

## 2012-10-09 MED ORDER — SODIUM CHLORIDE 0.9 % IJ SOLN
10.0000 mL | INTRAMUSCULAR | Status: DC | PRN
  Administered 2012-10-09: 10 mL
  Filled 2012-10-09: qty 10

## 2012-10-09 MED ORDER — TOPOTECAN HCL CHEMO INJECTION 4 MG
4.0000 mg/m2 | Freq: Once | INTRAVENOUS | Status: AC
  Administered 2012-10-09: 7 mg via INTRAVENOUS
  Filled 2012-10-09: qty 7

## 2012-10-09 NOTE — Telephone Encounter (Signed)
appts made and printed. Pt is aware that her tx will follow her ov on 9/15, 9/22, 9/29, 10/6, 10/13, and 10/20. i emailed MW to add the tx's...td

## 2012-10-09 NOTE — Telephone Encounter (Signed)
Pt is also aware that i sent MW an email to adjust her tx time for 9/8.[name]..td

## 2012-10-09 NOTE — Patient Instructions (Signed)
Use Flonase and saline nasal spray twice per day.  Doing well.  Proceed with chemotherapy today.  Please call us if you have any questions or concerns.

## 2012-10-09 NOTE — Progress Notes (Signed)
[facility]  [phone] [phone]  OFFICE PROGRESS NOTE   PCP: Elesa Massed, M.D. SU: Emelia Loron, M.D. RAD ONC: Antony Blackbird, M.D.   DIAGNOSIS: [name] [name] is a 67 year-old female with a history of ductal carcinoma in situ of the left breast diagnosed 04/2009 and ovarian cancer diagnosed in 12/2011.   PRIOR THERAPY: #1. 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 Patient had 3 sentinel biopsied all of them were negative for metastatic disease. Patient underwent radiation therapy between 06/05/2009 through 07/02/2009. She was then begun on Tamoxifen 20 mg daily in 09/2009.  #2  In 12/2011 she received the diagnosis of gynecologic malignancy presenting with abdominal mass and pleural effusions. Patient was originally hospitalized with shortness of breath in 12/2011.  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 She also had malignant ascites.  She underwent bilateral thoracentesis and paracentesis procedures during her hospitalization. During her hospitalization she was seen by gynecologic oncology.  #3 Patient began neoadjuvant chemotherapy consisting of Taxol and Carboplatinum. Her first cycle was administered during her hospitalization. She has completed a total of 6 cycles of Taxol/Carboplatinum from 01/07/2012 - 06/23/2012. Overall she tolerated it well except for some fatigue and neuropathies.  #4 Patient is status post laparotomy in 05/2012 that revealed significant residual disease.   #5  Patient began adjuvant therapy with Topotecan/Avastin beginning on 07/24/2012.   CURRENT THERAPY: Topotecan/Avastin therapy cycle 3 day 15   INTERVAL HISTORY:  [name] [name] 67 y.o. female returns today for followup of ovarian cancer.   She is doing well today.  Her main complaint is sinus  congestion and occasional bloody  Discharge when blowing her nose.  She has no epistaxis.  She has no sinus pain, or tenderness, no fevers, chills, nausea, vomiting, constipation.  Her mouth continues to improve and her numbness is stable.  Otherwise, a 10 point ROS is negative.     MEDICAL HISTORY: Past Medical History  Diagnosis Date  . Interstitial cystitis     on chronic antibiotics  . Hyperlipidemia   . Frequent UTI     on prophylaxis  . 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 CAD (coronary artery disease)     a. s/p CABG 7/13;   b.  LHC 12/01/11:  pLAD 70%, mLAD 40%, CFX 40-50% prior to takeoff of the OM2, oRCA occluded, mid vessel filled via R->R collaterals and distal vessel filled by L->R collaterals, S-OM1/OM2 (small and diffusely dz) with mid 90% stenosis, 90% at OM1 anastomotic site, continuation of OM2 occluded, S-Dx patent, S-PDA occluded, L-LAD ok, EF 55-65%  => Med Rx rec.  [name] Hx of echocardiogram     Echo 5/13: EF 60-65%, grade 2 diastolic dysfunction  . Hypercholesterolemia   . Pleural effusion 12/28/11    s/p pleurx catheter  . GERD (gastroesophageal reflux disease)   . Arthritis     "just a little; lower back" (12/28/11)  . Gout attack 08/2011    related to "stress post OHS"  . Depression   . Breast cancer     "left"; on Tamoxifen  . S/P thoracentesis 12/28/11    "for pleural effusion" (12/28/2011)  . Ovarian cancer   . Tachycardia   . Neuromuscular disorder     peripheral neuropathy    ALLERGIES:  is allergic to codeine; isosorbide; other; phenothiazines; talwin; yellow jacket venom; ambien;  and tegaderm ag mesh.  MEDICATIONS:  Current Outpatient Prescriptions  Medication Sig Dispense Refill  . ALPRAZolam (XANAX) 0.25 MG tablet Take 1 tablet (0.25 mg total) by mouth every 8 (eight) hours as needed for anxiety.  30 tablet  4  . Alum & Mag Hydroxide-Simeth (MAGIC MOUTHWASH) SOLN Take 5 mLs by mouth 4 (four) times daily as needed (mouth sores).      [name] aspirin 325 MG  tablet Take 325 mg by mouth at bedtime.       [name] atorvastatin (LIPITOR) 20 MG tablet TAKE ONE TABLET BY MOUTH ONCE DAILY  90 tablet  3  . carvedilol (COREG) 25 MG tablet Take 1 tablet (25 mg total) by mouth 2 (two) times daily.  180 tablet  3  . dexamethasone (DECADRON) 4 MG tablet Take 2 tablets (8 mg total) by mouth 2 (two) times daily with a meal. Take daily starting the day after chemotherapy for 2 days. Take with food.  30 tablet  1  . docusate sodium (COLACE) 100 MG capsule Take 200 mg by mouth daily.      [name] EPINEPHrine (EPIPEN 2-PAK) 0.3 mg/0.3 mL DEVI Inject 0.3 mg into the muscle daily as needed (allergic reaction).       . fluticasone (FLONASE) 50 MCG/ACT nasal spray Place 2 sprays into the nose daily as needed for allergies.      [name] gabapentin (NEURONTIN) 100 MG capsule Take 100 mg orally three times a day  90 capsule  6  . HYDROcodone-acetaminophen (NORCO/VICODIN) 5-325 MG per tablet Take 1-2 tablets by mouth every 4 (four) hours as needed.  40 tablet  2  . lidocaine-prilocaine (EMLA) cream Apply topically as needed.  30 g  1  . LORazepam (ATIVAN) 0.5 MG tablet Take 1 tablet (0.5 mg total) by mouth every 6 (six) hours as needed (Nausea or vomiting).  30 tablet  0  . nitroGLYCERIN (NITROSTAT) 0.4 MG SL tablet Place 0.4 mg under the tongue every 5 (five) minutes as needed. Chest pain      . omeprazole (PRILOSEC) 20 MG capsule Take 1 capsule (20 mg total) by mouth 2 (two) times daily.  60 capsule  12  . ondansetron (ZOFRAN) 8 MG tablet Take 1 tablet (8 mg total) by mouth 2 (two) times daily. Take two times a day starting the day after chemo for 2 days. Then take two times a day as needed for nausea or vomiting.  30 tablet  1  . oxybutynin (DITROPAN-XL) 10 MG 24 hr tablet Take 10 mg by mouth daily.      Bertram Gala Glycol-Propyl Glycol 0.4-0.3 % SOLN Place 1 drop into both eyes every morning.       . prochlorperazine (COMPAZINE) 10 MG tablet Take 1 tablet (10 mg total) by mouth every 6 (six)  hours as needed (Nausea or vomiting).  30 tablet  1  . prochlorperazine (COMPAZINE) 25 MG suppository Place 1 suppository (25 mg total) rectally every 12 (twelve) hours as needed for nausea.  12 suppository  3  . SF 5000 PLUS 1.1 % CREA dental cream Place 1.1 % onto teeth at bedtime.       [name] trimethoprim (TRIMPEX) 100 MG tablet Take 100 mg by mouth every morning.       . valACYclovir (VALTREX) 500 MG tablet Take 1 tablet (500 mg total) by mouth daily.  30 tablet  7  . venlafaxine XR (EFFEXOR-XR) 75 MG 24 hr capsule Take 75 mg  Orally daily  30  capsule  8   No current facility-administered medications for this visit.   Facility-Administered Medications Ordered in Other Visits  Medication Dose Route Frequency Provider Last Rate Last Dose  . influenza  inactive virus vaccine (FLUZONE/FLUARIX) injection 0.5 mL  0.5 mL Intramuscular Once Joselyn Arrow, MD        SURGICAL HISTORY:  Past Surgical History  Procedure Laterality Date  . Breast lumpectomy  04/2009    left  . Cesarean section  1973; 1976  . Muscle release  1960    L neck and chest.; "when I was 13; pneumonia settled in my left neck"  . Coronary artery bypass graft  08/18/2011    Procedure: CORONARY ARTERY BYPASS GRAFTING (CABG);  Surgeon: Alleen Borne, MD;  Location: [facility] OR;  Service: Open Heart Surgery;  Laterality: N/A;  Coronary Artery Bypass Graft times five utilizing the left internal mammary artery and the left greater saphenous vein harvested endoscopically.  . Abdominal hysterectomy  1976  . Appendectomy  1976  . Portacath placement  01/06/2012    Procedure: INSERTION PORT-A-CATH;  Surgeon: Alleen Borne, MD;  Location: [facility] OR;  Service: Thoracic;  Laterality: Left;  . Chest tube insertion  01/06/2012    Procedure: INSERTION PLEURAL DRAINAGE CATHETER;  Surgeon: Alleen Borne, MD;  Location: [facility] OR;  Service: Thoracic;  Laterality: Bilateral;  . Removal of pleural drainage catheter Right 04/12/2012    Procedure: MINOR REMOVAL OF  PLEURAL DRAINAGE CATHETER;  Surgeon: Alleen Borne, MD;  Location: [facility] OR;  Service: Thoracic;  Laterality: Right;  . Talc pleurodesis Left 04/12/2012    Procedure: Lurlean Nanny;  Surgeon: Alleen Borne, MD;  Location: [facility] OR;  Service: Thoracic;  Laterality: Left;  . Portacath placement Left 04/17/2012    Procedure: INSERTION PORT-A-CATH;  Surgeon: Alleen Borne, MD;  Location: [facility] OR;  Service: Thoracic;  Laterality: Left;  . Removal of pleural drainage catheter Left 04/17/2012    Procedure: REMOVAL OF PLEURAL DRAINAGE CATHETER;  Surgeon: Alleen Borne, MD;  Location: [facility] OR;  Service: Thoracic;  Laterality: Left;  . Port-a-cath removal Left 04/17/2012    Procedure: REMOVAL PORT-A-CATH;  Surgeon: Alleen Borne, MD;  Location: [facility] OR;  Service: Thoracic;  Laterality: Left;  . Cardiac catheterization    . Laparotomy Bilateral 05/30/2012    Procedure: Resection of umbilical mass, Partial omentectomy;  Surgeon: Jeannette Corpus, MD;  Location: [facility] ORS;  Service: Gynecology;  Laterality: Bilateral;    REVIEW OF SYSTEMS:   A 10 point review of systems was completed and is negative except as noted above.  PHYSICAL EXAMINATION:  There were no vitals taken for this visit.   General appearance: Alert, cooperative, thin frame no apparent distress Head: Normocephalic, without obvious abnormality, atraumatic Eyes: Arcus senilis, PERRLA, EOMI Nose: Nares, septum and mucosa are normal, no drainage or sinus tenderness Neck: No adenopathy, supple, symmetrical, trachea midline, no tenderness Resp: Clear to auscultation bilaterally Cardio: Regular rate and rhythm, S1, S2 normal, 1/6 murmur, no click, rub or gallop, left chest Port-A-Cath covered and EMLA cream Breasts:  Deferred GI: Soft, slight distention, non-tender, hypoactive bowel sounds, no organomegaly Extremities: Extremities normal, atraumatic, no cyanosis or edema Lymph nodes: Cervical and supraclavicular lymph nodes are normal Neurologic:  Grossly normal ECOG PERFORMANCE STATUS: 1 - Symptomatic but completely ambulatory.   LABORATORY DATA: Lab Results  Component Value Date   WBC 3.9 10/09/2012   HGB 9.3* 10/09/2012   HCT 30.1* 10/09/2012   MCV 91.5  10/09/2012   PLT 108* 10/09/2012      Chemistry      Component Value Date/Time   NA 140 10/02/2012 0920   NA 134* 06/02/2012 0515   NA 141 12/17/2011 1144   K 4.3 10/02/2012 0920   K 4.4 06/02/2012 0515   CL 103 08/07/2012 1108   CL 100 06/02/2012 0515   CO2 24 10/02/2012 0920   CO2 25 06/02/2012 0515   BUN 19.0 10/02/2012 0920   BUN 20 06/02/2012 0515   BUN 15 12/17/2011 1144   CREATININE 0.7 10/02/2012 0920   CREATININE 0.76 06/02/2012 0515      Component Value Date/Time   CALCIUM 9.2 10/02/2012 0920   CALCIUM 8.7 06/02/2012 0515   ALKPHOS 74 10/02/2012 0920   ALKPHOS 85 05/26/2012 0955   AST 19 10/02/2012 0920   AST 22 05/26/2012 0955   ALT 26 10/02/2012 0920   ALT 16 05/26/2012 0955   BILITOT 0.43 10/02/2012 0920   BILITOT 0.3 05/26/2012 0955       RADIOGRAPHIC STUDIES: Dg Chest 2 View 08/30/2012   *RADIOLOGY REPORT*  Clinical Data: Shortness of breath, history metastatic ovarian carcinoma and history of bilateral pleural effusions.  CHEST - 2 VIEW  Comparison: 05/26/2012  Findings: There is significant diminishment in bilateral pleural effusions since the prior study with only trace amount of pleural fluid present bilaterally.  The lungs show no evidence of focal consolidation, masses or pulmonary edema.  Port-A-Cath positioning stable in the SVC.  The heart size is normal.  No bony lesions are seen.  IMPRESSION: Significant diminishment in bilateral pleural effusions since the prior chest x-ray. Trace bilateral pleural effusions remain.  No acute findings.   Original Report Authenticated By: Irish Lack, M.D.     ASSESSMENT: 67 year old woman:   #1 History of DCIS of the left breast status post lumpectomy in 04/2009.  Status post radiation therapy completed in 06/2009.  Was  on antiestrogen therapy with Tamoxifen 20 mg by mouth daily that was started in 09/2009.  Tamoxifen has been discontinued for now due to her recent diagnosis of ovarian cancer.  #2 Gynecologic malignancy (ovarian carcinoma) diagnosed in 12/2011. Patient received 6 cycles of neoadjuvant chemotherapy consisting of Taxol/Carboplatinum completed in 06/2012.  #3 She is now status post laparotomy performed on 05/30/2012. Unfortunately, intraoperatively she was found to have significant residual disease. 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   #4 Patient completed chemotherapy sensitivity testing. Unfortunately the results were inconclusive.  It was decided to proceed with chemotherapy consisting of Topotecan and Avastin.  Dr. Grant Ruts also recommended this treatment regimen.  #5 Patient was started chemotherapy consisting of Topotecan and Avastin  With Topotecan q week on day 1, 8,15 on a 28 day cycle, and Avastin scheduled for every 3 weeks.  #6 Depression/anxiety: Currently on Effexor with good response  #7 Neutropenia resolved due to Neupogen  #8 History of dehydration -patient instructed to drink plenty of water  #9  Cold sores/herpes labialis.  #10 Neuropathy - currently on Gabapentin 100 mg by mouth 3 times a day.   PLAN: #1  Doing well.  Patient will proceed with treatment today with Topotecan.    #2 She will continue Valtrex for her mouth and magic mouthwash as needed.   #3 She will return next week for labs and  Evaluation.  I requested several more appointments be scheduled through October.    #4 She  will continue using flonase and saline nasal spray for her nose.    All questions were answered.  Patient was encouraged to contact us with any questions, problems or concerns.   I spent 25 minutes counseling the patient face to face.  The total time spent in the appointment was 30  minutes.   Cherie Ouch Lyn Hollingshead, NP Medical Oncology [facility] Phone: [phone] 10/09/2012  12:05 PM

## 2012-10-09 NOTE — Telephone Encounter (Signed)
Per staff message I have adjusted appt for 9/8

## 2012-10-09 NOTE — Patient Instructions (Addendum)
[facility] Discharge Instructions for Patients Receiving Chemotherapy  Today you received the following chemotherapy agents Hycamtin.  To help prevent nausea and vomiting after your treatment, we encourage you to take your nausea medication as prescribed.   If you develop nausea and vomiting that is not controlled by your nausea medication, call the clinic.   BELOW ARE SYMPTOMS THAT SHOULD BE REPORTED IMMEDIATELY:  *FEVER GREATER THAN 100.5 F  *CHILLS WITH OR WITHOUT FEVER  NAUSEA AND VOMITING THAT IS NOT CONTROLLED WITH YOUR NAUSEA MEDICATION  *UNUSUAL SHORTNESS OF BREATH  *UNUSUAL BRUISING OR BLEEDING  TENDERNESS IN MOUTH AND THROAT WITH OR WITHOUT PRESENCE OF ULCERS  *URINARY PROBLEMS  *BOWEL PROBLEMS  UNUSUAL RASH Items with * indicate a potential emergency and should be followed up as soon as possible.  Feel free to call the clinic you have any questions or concerns. The clinic phone number is [phone].

## 2012-10-17 ENCOUNTER — Encounter: Payer: Self-pay | Admitting: Oncology

## 2012-10-17 ENCOUNTER — Other Ambulatory Visit (HOSPITAL_BASED_OUTPATIENT_CLINIC_OR_DEPARTMENT_OTHER): Payer: Medicare Other

## 2012-10-17 ENCOUNTER — Ambulatory Visit (HOSPITAL_BASED_OUTPATIENT_CLINIC_OR_DEPARTMENT_OTHER): Payer: Medicare Other | Admitting: Oncology

## 2012-10-17 VITALS — BP 108/68 | HR 92 | Temp 98.2°F | Resp 20 | Ht 66.0 in | Wt 151.5 lb

## 2012-10-17 DIAGNOSIS — Z853 Personal history of malignant neoplasm of breast: Secondary | ICD-10-CM

## 2012-10-17 DIAGNOSIS — B009 Herpesviral infection, unspecified: Secondary | ICD-10-CM

## 2012-10-17 DIAGNOSIS — R7989 Other specified abnormal findings of blood chemistry: Secondary | ICD-10-CM

## 2012-10-17 DIAGNOSIS — C786 Secondary malignant neoplasm of retroperitoneum and peritoneum: Secondary | ICD-10-CM

## 2012-10-17 DIAGNOSIS — C569 Malignant neoplasm of unspecified ovary: Secondary | ICD-10-CM

## 2012-10-17 DIAGNOSIS — C50919 Malignant neoplasm of unspecified site of unspecified female breast: Secondary | ICD-10-CM

## 2012-10-17 LAB — COMPREHENSIVE METABOLIC PANEL (CC13)
Albumin: 3 g/dL — ABNORMAL LOW (ref 3.5–5.0)
Alkaline Phosphatase: 82 U/L (ref 40–150)
CO2: 22 mEq/L (ref 22–29)
Chloride: 110 mEq/L — ABNORMAL HIGH (ref 98–109)
Sodium: 142 mEq/L (ref 136–145)
Total Bilirubin: 0.4 mg/dL (ref 0.20–1.20)

## 2012-10-17 LAB — CBC WITH DIFFERENTIAL/PLATELET
Basophils Absolute: 0 10*3/uL (ref 0.0–0.1)
Eosinophils Absolute: 0.1 10*3/uL (ref 0.0–0.5)
HGB: 9.1 g/dL — ABNORMAL LOW (ref 11.6–15.9)
MCV: 92.7 fL (ref 79.5–101.0)
MONO#: 0.2 10*3/uL (ref 0.1–0.9)
MONO%: 6.2 % (ref 0.0–14.0)
NEUT#: 1.3 10*3/uL — ABNORMAL LOW (ref 1.5–6.5)
RBC: 3.16 10*6/uL — ABNORMAL LOW (ref 3.70–5.45)
RDW: 22.1 % — ABNORMAL HIGH (ref 11.2–14.5)
WBC: 2.4 10*3/uL — ABNORMAL LOW (ref 3.9–10.3)
lymph#: 0.8 10*3/uL — ABNORMAL LOW (ref 0.9–3.3)
nRBC: 9 % — ABNORMAL HIGH (ref 0–0)

## 2012-10-17 LAB — UA PROTEIN, DIPSTICK - CHCC: Protein, ur: NEGATIVE mg/dL

## 2012-10-17 LAB — TECHNOLOGIST REVIEW

## 2012-10-18 ENCOUNTER — Telehealth: Payer: Self-pay | Admitting: Oncology

## 2012-10-18 ENCOUNTER — Telehealth: Payer: Self-pay | Admitting: Cardiovascular Disease

## 2012-10-18 ENCOUNTER — Ambulatory Visit (INDEPENDENT_AMBULATORY_CARE_PROVIDER_SITE_OTHER)
Admission: RE | Admit: 2012-10-18 | Discharge: 2012-10-18 | Disposition: A | Discharge status: Home / Self Care | Payer: Medicare Other | Source: Ambulatory Visit | Attending: Cardiovascular Disease | Admitting: Cardiovascular Disease

## 2012-10-18 ENCOUNTER — Ambulatory Visit: Payer: Medicare Other

## 2012-10-18 DIAGNOSIS — R0602 Shortness of breath: Secondary | ICD-10-CM

## 2012-10-18 NOTE — Telephone Encounter (Signed)
New problem   Was Told by her oncology Md to make an appt . Does not want to see the PA only MD. The first available is in nov.

## 2012-10-18 NOTE — Telephone Encounter (Signed)
Pt stopped by for her schedule today. Per 9/2 pof pt to have lb w/inf and provider visits, pet/ct and 15 min appt (no date attached). Pt already on schedule for lb/fu visits until 10/20. Added tx for 9/15. Per pt she will be out of town 9/29 and appts for that day have already been cx'd by [facility]. [facility] has already contacted pt re pet/ct and appts appear on schedule. Pt will s/w LA 9/8 as a reminder of her being out of town 9/29 and discuss when she will start back up on tx.

## 2012-10-18 NOTE — Progress Notes (Signed)
Texted Dr Welton Flakes about abnormal labs. No tx today per Dr Welton Flakes via Chrystie Nose RN. Pt is aware of next appts.

## 2012-10-18 NOTE — Telephone Encounter (Signed)
App made for tomorrow and cxr ordered, pt will go for cxr today.

## 2012-10-19 ENCOUNTER — Encounter: Payer: Self-pay | Admitting: Cardiovascular Disease

## 2012-10-19 ENCOUNTER — Ambulatory Visit (INDEPENDENT_AMBULATORY_CARE_PROVIDER_SITE_OTHER): Payer: Medicare Other | Admitting: Cardiovascular Disease

## 2012-10-19 VITALS — BP 120/73 | HR 89 | Ht 66.0 in | Wt 151.1 lb

## 2012-10-19 DIAGNOSIS — J90 Pleural effusion, not elsewhere classified: Secondary | ICD-10-CM

## 2012-10-19 DIAGNOSIS — R0609 Other forms of dyspnea: Secondary | ICD-10-CM

## 2012-10-19 DIAGNOSIS — R0600 Dyspnea, unspecified: Secondary | ICD-10-CM

## 2012-10-19 DIAGNOSIS — R0602 Shortness of breath: Secondary | ICD-10-CM

## 2012-10-19 NOTE — Assessment & Plan Note (Addendum)
[name] [name] presents today for further evaluation of her severe exertional dyspnea. He denies any PND or orthopnea. She denies any leg swelling. She denies any fever or cough. She has normal left ventricular systolic function on echo this past February.  Her oxygen saturation is percent at rest and with exertion - 97%.  She has a history of large bilateral pleural effusions and has had talc pleurodesis is on the left side.  I suspect that she has some restrictive physiology because of the pleuresesis on the left side.  [name] not having any episodes of angina.  We'll send her for a full set of pulmonary function tests. We will refer her to the pulmonologist. I'll see her back at her regular scheduled appointment in December.

## 2012-10-19 NOTE — Patient Instructions (Addendum)
Your physician has recommended that you have a pulmonary function test. Pulmonary Function Tests are a group of tests that measure how well air moves in and out of your lungs.   You have been referred to pulmonology for bilateral pleural effusion with sob  Your physician recommends that you schedule a follow-up appointment in: December

## 2012-10-19 NOTE — Progress Notes (Signed)
[name] [name] Date of Birth  1945-10-15       [facility]    [place] [address]  [address], [address] [place], [place]  [address]   [place], [place]  [address] [phone]     [phone]   Fax  [phone]    Fax [phone]  Problem List: 1. CAD- CABG 2. Breast cancer - on Tamoxifin  3. Hyperlipidemia  4. Interstitial cystitis  History of Present Illness:  [name] [name] has not been doing very well.  She presented with CP and cardiac cath revealed that the saphenous vein graft to the left circumflex artery was severely diseased. Her native circumflex artery has only minor luminal irregularities. The saphenous vein graft to right coronary artery was also found to be occluded and her native right coronary artery is chronically occluded.  Since that time she's been tried on medical therapy.  We doubled her metoprol and added isosorbide.. This has caused profound fatigue. She also has had severe shortness of breath, and severe headache. She has stopped the isosorbide ( because of the headache).  She also has been having lots of acid reflux and a chronic cough.  She has noted exertional dyspnea, and cough.  April 18, 2012:  That he has had a rough time since I last saw her. She has been receiving chemotherapy for ovarian cancer. She has had bilateral Pleurx tubes for chronic pleural effusions appear she's had talc placement into her pleural space.  She has just completed her chemotherapy and is now scheduled have surgical debridement of her tumor and ovaries. We have obtained an echocardiogram which reveals normal left systolic function. [name] not having any episodes of chest pain or shortness breath. She is very fatigued and quite deconditioned.  July 19, 2012:  She has had abdomina surgery since I last saw her.  She did well from a cardiac standpoint.  She still has lots of tumors and will be starting another round of chemotherapy.    Sept. 4, 2014:  That he presents today as a work in visit for severe shortness of breath.  She has had severe dyspnea while walking - especially in the morning.    She is much better from a cancer standpoint.   . No cough, no fever, no orthopnea or PND.  No angina pain.    When she was originally diagnosed with ovarian cancer she was found to have large pleural effusions. She had pleuraldesis with talc powder to her left pleural space.   Her tumor markers are down to 30 (started at 300).    Current Outpatient Prescriptions on File Prior to Visit  Medication Sig Dispense Refill  . ALPRAZolam (XANAX) 0.25 MG tablet Take 1 tablet (0.25 mg total) by mouth every 8 (eight) hours as needed for anxiety.  30 tablet  4  . Alum & Mag Hydroxide-Simeth (MAGIC MOUTHWASH) SOLN Take 5 mLs by mouth 4 (four) times daily as needed (mouth sores).      [name] aspirin 325 MG tablet Take 325 mg by mouth at bedtime.       [name] atorvastatin (LIPITOR) 20 MG tablet TAKE ONE TABLET BY MOUTH ONCE DAILY  90 tablet  3  . carvedilol (COREG) 25 MG tablet Take 1 tablet (25 mg total) by mouth 2 (two) times daily.  180 tablet  3  . dexamethasone (DECADRON) 4 MG tablet Take 2 tablets (8 mg total) by mouth 2 (two) times daily with a meal. Take  daily starting the day after chemotherapy for 2 days. Take with food.  30 tablet  1  . docusate sodium (COLACE) 100 MG capsule Take 200 mg by mouth daily.      [name] EPINEPHrine (EPIPEN 2-PAK) 0.3 mg/0.3 mL DEVI Inject 0.3 mg into the muscle daily as needed (allergic reaction).       . fluticasone (FLONASE) 50 MCG/ACT nasal spray Place 2 sprays into the nose daily as needed for allergies.      [name] gabapentin (NEURONTIN) 100 MG capsule Take 100 mg orally three times a day  90 capsule  6  . HYDROcodone-acetaminophen (NORCO/VICODIN) 5-325 MG per tablet Take 1-2 tablets by mouth every 4 (four) hours as needed.  40 tablet  2  . lidocaine-prilocaine (EMLA) cream Apply topically as needed.  30 g  1  .  LORazepam (ATIVAN) 0.5 MG tablet Take 1 tablet (0.5 mg total) by mouth every 6 (six) hours as needed (Nausea or vomiting).  30 tablet  0  . nitroGLYCERIN (NITROSTAT) 0.4 MG SL tablet Place 0.4 mg under the tongue every 5 (five) minutes as needed. Chest pain      . omeprazole (PRILOSEC) 20 MG capsule Take 1 capsule (20 mg total) by mouth 2 (two) times daily.  60 capsule  12  . ondansetron (ZOFRAN) 8 MG tablet Take 1 tablet (8 mg total) by mouth 2 (two) times daily. Take two times a day starting the day after chemo for 2 days. Then take two times a day as needed for nausea or vomiting.  30 tablet  1  . oxybutynin (DITROPAN-XL) 10 MG 24 hr tablet Take 10 mg by mouth daily.      Bertram Gala Glycol-Propyl Glycol 0.4-0.3 % SOLN Place 1 drop into both eyes every morning.       . prochlorperazine (COMPAZINE) 10 MG tablet Take 1 tablet (10 mg total) by mouth every 6 (six) hours as needed (Nausea or vomiting).  30 tablet  1  . prochlorperazine (COMPAZINE) 25 MG suppository Place 1 suppository (25 mg total) rectally every 12 (twelve) hours as needed for nausea.  12 suppository  3  . SF 5000 PLUS 1.1 % CREA dental cream Place 1.1 % onto teeth at bedtime.       [name] trimethoprim (TRIMPEX) 100 MG tablet Take 100 mg by mouth every morning.       . valACYclovir (VALTREX) 500 MG tablet Take 1 tablet (500 mg total) by mouth daily.  30 tablet  7  . venlafaxine XR (EFFEXOR-XR) 75 MG 24 hr capsule Take 75 mg  Orally daily  30 capsule  8   Current Facility-Administered Medications on File Prior to Visit  Medication Dose Route Frequency Provider Last Rate Last Dose  . influenza  inactive virus vaccine (FLUZONE/FLUARIX) injection 0.5 mL  0.5 mL Intramuscular Once Joselyn Arrow, MD        Allergies  Allergen Reactions  . Codeine Nausea And Vomiting       . Isosorbide Other (See Comments)    Extreme headaches  . Other Swelling    All Lip moisturizers except Vaseline.  . Phenothiazines Other (See Comments)    Makes her  stop breathing.  Durene Fruits [Pentazocine] Nausea And Vomiting  . Yellow Jacket Venom Anaphylaxis  . Ambien [Zolpidem Tartrate]     Bad dreams  . Tegaderm Ag Mesh [Silver] Rash    USE ONLY OPSITE TO PAC    Past Medical History  Diagnosis Date  . Interstitial cystitis  on chronic antibiotics  . Hyperlipidemia   . Frequent UTI     on prophylaxis  . 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 CAD (coronary artery disease)     a. s/p CABG 7/13;   b.  LHC 12/01/11:  pLAD 70%, mLAD 40%, CFX 40-50% prior to takeoff of the OM2, oRCA occluded, mid vessel filled via R->R collaterals and distal vessel filled by L->R collaterals, S-OM1/OM2 (small and diffusely dz) with mid 90% stenosis, 90% at OM1 anastomotic site, continuation of OM2 occluded, S-Dx patent, S-PDA occluded, L-LAD ok, EF 55-65%  => Med Rx rec.  [name] Hx of echocardiogram     Echo 5/13: EF 60-65%, grade 2 diastolic dysfunction  . Hypercholesterolemia   . Pleural effusion 12/28/11    s/p pleurx catheter  . GERD (gastroesophageal reflux disease)   . Arthritis     "just a little; lower back" (12/28/11)  . Gout attack 08/2011    related to "stress post OHS"  . Depression   . Breast cancer     "left"; on Tamoxifen  . S/P thoracentesis 12/28/11    "for pleural effusion" (12/28/2011)  . Ovarian cancer   . Tachycardia   . Neuromuscular disorder     peripheral neuropathy    Past Surgical History  Procedure Laterality Date  . Breast lumpectomy  04/2009    left  . Cesarean section  1973; 1976  . Muscle release  1960    L neck and chest.; "when I was 13; pneumonia settled in my left neck"  . Coronary artery bypass graft  08/18/2011    Procedure: CORONARY ARTERY BYPASS GRAFTING (CABG);  Surgeon: Alleen Borne, MD;  Location: [facility] OR;  Service: Open Heart Surgery;  Laterality: N/A;  Coronary Artery Bypass Graft times five utilizing the left internal mammary artery and the left greater saphenous vein harvested endoscopically.  . Abdominal hysterectomy   1976  . Appendectomy  1976  . Portacath placement  01/06/2012    Procedure: INSERTION PORT-A-CATH;  Surgeon: Alleen Borne, MD;  Location: [facility] OR;  Service: Thoracic;  Laterality: Left;  . Chest tube insertion  01/06/2012    Procedure: INSERTION PLEURAL DRAINAGE CATHETER;  Surgeon: Alleen Borne, MD;  Location: [facility] OR;  Service: Thoracic;  Laterality: Bilateral;  . Removal of pleural drainage catheter Right 04/12/2012    Procedure: MINOR REMOVAL OF PLEURAL DRAINAGE CATHETER;  Surgeon: Alleen Borne, MD;  Location: [facility] OR;  Service: Thoracic;  Laterality: Right;  . Talc pleurodesis Left 04/12/2012    Procedure: Lurlean Nanny;  Surgeon: Alleen Borne, MD;  Location: [facility] OR;  Service: Thoracic;  Laterality: Left;  . Portacath placement Left 04/17/2012    Procedure: INSERTION PORT-A-CATH;  Surgeon: Alleen Borne, MD;  Location: [facility] OR;  Service: Thoracic;  Laterality: Left;  . Removal of pleural drainage catheter Left 04/17/2012    Procedure: REMOVAL OF PLEURAL DRAINAGE CATHETER;  Surgeon: Alleen Borne, MD;  Location: [facility] OR;  Service: Thoracic;  Laterality: Left;  . Port-a-cath removal Left 04/17/2012    Procedure: REMOVAL PORT-A-CATH;  Surgeon: Alleen Borne, MD;  Location: [facility] OR;  Service: Thoracic;  Laterality: Left;  . Cardiac catheterization    . Laparotomy Bilateral 05/30/2012    Procedure: Resection of umbilical mass, Partial omentectomy;  Surgeon: Jeannette Corpus, MD;  Location: [facility] ORS;  Service: Gynecology;  Laterality: Bilateral;    History  Smoking status  . Former Smoker -- 0.12 packs/day for 15 years  .  Types: Cigarettes  . Quit date: 02/15/2001  Smokeless tobacco  . Never Used    History  Alcohol Use  . 0.0 oz/week    Comment: 12/28/11 "1 gin & tonic q hs", 2/5/141-2 glass a wine occa  05/26/12 none x 1 year    Family History  Problem Relation Age of Onset  . Cancer Mother 84    breast cancer  . Heart disease Father 62    MI at 60, CABG in 7's  . Hepatitis  Father     C from blood transfusion  . Heart disease Brother     CABG in 49's  . Heart disease Paternal Aunt   . Heart disease Paternal Uncle   . Heart disease Paternal Grandfather   . Diabetes Neg Hx     Reviw of Systems:  Reviewed in the HPI.  All other systems are negative.  Physical Exam: Blood pressure 120/73, pulse 89, height 5\' 6"  (1.676 m), weight 151 lb 1.9 oz (68.548 kg), SpO2 100.00%. General: chronically ill appearing female, edematous, she is looking much better / stronger today.  She has re-grown some of her hair.   Head: Normocephalic, atraumatic, sclera non-icteric, mucus membranes are moist,   Neck: Supple. Carotids are 2 + without bruits. No JVD  Lungs: decreased breath sounds in the bases.  Right pleurex tube site has stitches.  The left pleurex site is bandaged.    Heart: regular rate.  normal  S1 S2. No murmurs, gallops or rubs.  Abdomen: mild distension  Msk:    Extremities: No clubbing or cyanosis. 1+ edema.  Distal pedal pulses are 2+ and equal bilaterally.  Neuro: Alert and oriented X 3. Moves all extremities spontaneously.  Psych:  Responds to questions appropriately with a normal affect.  ECG:  Assessment / Plan:

## 2012-10-23 ENCOUNTER — Ambulatory Visit (HOSPITAL_BASED_OUTPATIENT_CLINIC_OR_DEPARTMENT_OTHER): Payer: Medicare Other

## 2012-10-23 ENCOUNTER — Ambulatory Visit (HOSPITAL_BASED_OUTPATIENT_CLINIC_OR_DEPARTMENT_OTHER): Payer: Medicare Other | Admitting: Adult Health

## 2012-10-23 ENCOUNTER — Other Ambulatory Visit (HOSPITAL_BASED_OUTPATIENT_CLINIC_OR_DEPARTMENT_OTHER): Payer: Medicare Other | Admitting: Lab

## 2012-10-23 ENCOUNTER — Encounter: Payer: Self-pay | Admitting: Adult Health

## 2012-10-23 VITALS — BP 113/55 | Resp 20

## 2012-10-23 VITALS — BP 108/66 | HR 85 | Temp 98.0°F | Resp 18 | Ht 66.0 in | Wt 153.5 lb

## 2012-10-23 DIAGNOSIS — C786 Secondary malignant neoplasm of retroperitoneum and peritoneum: Secondary | ICD-10-CM

## 2012-10-23 DIAGNOSIS — C569 Malignant neoplasm of unspecified ovary: Secondary | ICD-10-CM

## 2012-10-23 DIAGNOSIS — Z5112 Encounter for antineoplastic immunotherapy: Secondary | ICD-10-CM

## 2012-10-23 DIAGNOSIS — Z5111 Encounter for antineoplastic chemotherapy: Secondary | ICD-10-CM

## 2012-10-23 DIAGNOSIS — B009 Herpesviral infection, unspecified: Secondary | ICD-10-CM

## 2012-10-23 DIAGNOSIS — R0602 Shortness of breath: Secondary | ICD-10-CM

## 2012-10-23 LAB — CBC WITH DIFFERENTIAL/PLATELET
BASO%: 0.5 % (ref 0.0–2.0)
Basophils Absolute: 0 10*3/uL (ref 0.0–0.1)
EOS%: 3.6 % (ref 0.0–7.0)
HCT: 30.2 % — ABNORMAL LOW (ref 34.8–46.6)
LYMPH%: 22.7 % (ref 14.0–49.7)
MCH: 29.1 pg (ref 25.1–34.0)
MCHC: 30.1 g/dL — ABNORMAL LOW (ref 31.5–36.0)
MCV: 96.5 fL (ref 79.5–101.0)
MONO%: 15.4 % — ABNORMAL HIGH (ref 0.0–14.0)
NEUT%: 57.8 % (ref 38.4–76.8)
Platelets: 375 10*3/uL (ref 145–400)
lymph#: 1 10*3/uL (ref 0.9–3.3)

## 2012-10-23 LAB — COMPREHENSIVE METABOLIC PANEL (CC13)
AST: 21 U/L (ref 5–34)
Alkaline Phosphatase: 88 U/L (ref 40–150)
BUN: 14.4 mg/dL (ref 7.0–26.0)
Creatinine: 0.7 mg/dL (ref 0.6–1.1)
Glucose: 98 mg/dl (ref 70–140)
Total Bilirubin: 0.34 mg/dL (ref 0.20–1.20)

## 2012-10-23 MED ORDER — DEXAMETHASONE SODIUM PHOSPHATE 10 MG/ML IJ SOLN
INTRAMUSCULAR | Status: AC
  Filled 2012-10-23: qty 1

## 2012-10-23 MED ORDER — SODIUM CHLORIDE 0.9 % IV SOLN
15.0000 mg/kg | Freq: Once | INTRAVENOUS | Status: AC
  Administered 2012-10-23: 1025 mg via INTRAVENOUS
  Filled 2012-10-23: qty 41

## 2012-10-23 MED ORDER — HEPARIN SOD (PORK) LOCK FLUSH 100 UNIT/ML IV SOLN
500.0000 [IU] | Freq: Once | INTRAVENOUS | Status: AC | PRN
  Administered 2012-10-23: 500 [IU]
  Filled 2012-10-23: qty 5

## 2012-10-23 MED ORDER — ONDANSETRON 8 MG/NS 50 ML IVPB
INTRAVENOUS | Status: AC
  Filled 2012-10-23: qty 8

## 2012-10-23 MED ORDER — DEXAMETHASONE SODIUM PHOSPHATE 10 MG/ML IJ SOLN
10.0000 mg | Freq: Once | INTRAMUSCULAR | Status: AC
  Administered 2012-10-23: 10 mg via INTRAVENOUS

## 2012-10-23 MED ORDER — ONDANSETRON 8 MG/50ML IVPB (CHCC)
8.0000 mg | Freq: Once | INTRAVENOUS | Status: AC
  Administered 2012-10-23: 8 mg via INTRAVENOUS

## 2012-10-23 MED ORDER — SODIUM CHLORIDE 0.9 % IV SOLN
Freq: Once | INTRAVENOUS | Status: AC
  Administered 2012-10-23: 11:00:00 via INTRAVENOUS

## 2012-10-23 MED ORDER — SODIUM CHLORIDE 0.9 % IJ SOLN
10.0000 mL | INTRAMUSCULAR | Status: DC | PRN
  Administered 2012-10-23: 10 mL
  Filled 2012-10-23: qty 10

## 2012-10-23 MED ORDER — SODIUM CHLORIDE 0.9 % IV SOLN
4.0000 mg/m2 | Freq: Once | INTRAVENOUS | Status: AC
  Administered 2012-10-23: 7 mg via INTRAVENOUS
  Filled 2012-10-23: qty 7

## 2012-10-23 NOTE — Progress Notes (Signed)
[facility]  [phone] [phone]) [phone]  OFFICE PROGRESS NOTE   PCP: Elesa Massed, M.D. SU: Emelia Loron, M.D. RAD ONC: Antony Blackbird, M.D.   DIAGNOSIS: [name] [name] is a 67 year-old female with a history of ductal carcinoma in situ of the left breast diagnosed 04/2009 and ovarian cancer diagnosed in 12/2011.   PRIOR THERAPY: #1. 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 Patient had 3 sentinel biopsied all of them were negative for metastatic disease. Patient underwent radiation therapy between 06/05/2009 through 07/02/2009. She was then begun on Tamoxifen 20 mg daily in 09/2009.  #2  In 12/2011 she received the diagnosis of gynecologic malignancy presenting with abdominal mass and pleural effusions. Patient was originally hospitalized with shortness of breath in 12/2011.  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 She also had malignant ascites.  She underwent bilateral thoracentesis and paracentesis procedures during her hospitalization. During her hospitalization she was seen by gynecologic oncology.  #3 Patient began neoadjuvant chemotherapy consisting of Taxol and Carboplatinum. Her first cycle was administered during her hospitalization. She has completed a total of 6 cycles of Taxol/Carboplatinum from 01/07/2012 - 06/23/2012. Overall she tolerated it well except for some fatigue and neuropathies.  #4 Patient is status post laparotomy in 05/2012 that revealed significant residual disease.   #5  Patient began adjuvant therapy with Topotecan/Avastin beginning on 07/24/2012.   CURRENT THERAPY: Topotecan/Avastin   INTERVAL HISTORY:  [name] 67 y.o. female returns today for followup of ovarian cancer.   She is doing well today.  She does endorse shortness of breath.  She saw Dr. Welton Flakes  last week and was recommended to f/u with her cardiologist.  She has an appointment with pulmonology, however is concerned that it is in October and would like [place] to call and get her in sooner.  She is going out of town 9/27-10/4.  She continues to have mild numbness in her fingertips.  Otherwise, she denies fevers, chills, nausea,vomiting, constipation, diarrhea, pain or any further concerns.    MEDICAL HISTORY: Past Medical History  Diagnosis Date  . Interstitial cystitis     on chronic antibiotics  . Hyperlipidemia   . Frequent UTI     on prophylaxis  . 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 CAD (coronary artery disease)     a. s/p CABG 7/13;   b.  LHC 12/01/11:  pLAD 70%, mLAD 40%, CFX 40-50% prior to takeoff of the OM2, oRCA occluded, mid vessel filled via R->R collaterals and distal vessel filled by L->R collaterals, S-OM1/OM2 (small and diffusely dz) with mid 90% stenosis, 90% at OM1 anastomotic site, continuation of OM2 occluded, S-Dx patent, S-PDA occluded, L-LAD ok, EF 55-65%  => Med Rx rec.  [name] Hx of echocardiogram     Echo 5/13: EF 60-65%, grade 2 diastolic dysfunction  . Hypercholesterolemia   . Pleural effusion 12/28/11    s/p pleurx catheter  . GERD (gastroesophageal reflux disease)   . Arthritis     "just a little; lower back" (12/28/11)  . Gout attack 08/2011    related to "stress post OHS"  . Depression   . Breast cancer     "left"; on Tamoxifen  . S/P thoracentesis 12/28/11    "for pleural effusion" (12/28/2011)  . Ovarian cancer   . Tachycardia   . Neuromuscular disorder  peripheral neuropathy    ALLERGIES:  is allergic to codeine; isosorbide; other; phenothiazines; talwin; yellow jacket venom; ambien; and tegaderm ag mesh.  MEDICATIONS:  Current Outpatient Prescriptions  Medication Sig Dispense Refill  . ALPRAZolam (XANAX) 0.25 MG tablet Take 1 tablet (0.25 mg total) by mouth every 8 (eight) hours as needed for anxiety.  30 tablet  4  . Alum & Mag Hydroxide-Simeth  (MAGIC MOUTHWASH) SOLN Take 5 mLs by mouth 4 (four) times daily as needed (mouth sores).      [name] aspirin 325 MG tablet Take 325 mg by mouth at bedtime.       [name] atorvastatin (LIPITOR) 20 MG tablet TAKE ONE TABLET BY MOUTH ONCE DAILY  90 tablet  3  . carvedilol (COREG) 25 MG tablet Take 1 tablet (25 mg total) by mouth 2 (two) times daily.  180 tablet  3  . dexamethasone (DECADRON) 4 MG tablet Take 2 tablets (8 mg total) by mouth 2 (two) times daily with a meal. Take daily starting the day after chemotherapy for 2 days. Take with food.  30 tablet  1  . docusate sodium (COLACE) 100 MG capsule Take 200 mg by mouth daily.      [name] EPINEPHrine (EPIPEN 2-PAK) 0.3 mg/0.3 mL DEVI Inject 0.3 mg into the muscle daily as needed (allergic reaction).       . fluticasone (FLONASE) 50 MCG/ACT nasal spray Place 2 sprays into the nose daily as needed for allergies.      [name] gabapentin (NEURONTIN) 100 MG capsule Take 100 mg orally three times a day  90 capsule  6  . HYDROcodone-acetaminophen (NORCO/VICODIN) 5-325 MG per tablet Take 1-2 tablets by mouth every 4 (four) hours as needed.  40 tablet  2  . lidocaine-prilocaine (EMLA) cream Apply topically as needed.  30 g  1  . LORazepam (ATIVAN) 0.5 MG tablet Take 1 tablet (0.5 mg total) by mouth every 6 (six) hours as needed (Nausea or vomiting).  30 tablet  0  . nitroGLYCERIN (NITROSTAT) 0.4 MG SL tablet Place 0.4 mg under the tongue every 5 (five) minutes as needed. Chest pain      . omeprazole (PRILOSEC) 20 MG capsule Take 1 capsule (20 mg total) by mouth 2 (two) times daily.  60 capsule  12  . ondansetron (ZOFRAN) 8 MG tablet Take 1 tablet (8 mg total) by mouth 2 (two) times daily. Take two times a day starting the day after chemo for 2 days. Then take two times a day as needed for nausea or vomiting.  30 tablet  1  . oxybutynin (DITROPAN-XL) 10 MG 24 hr tablet Take 10 mg by mouth daily.      Bertram Gala Glycol-Propyl Glycol 0.4-0.3 % SOLN Place 1 drop into both eyes every  morning.       . prochlorperazine (COMPAZINE) 10 MG tablet Take 1 tablet (10 mg total) by mouth every 6 (six) hours as needed (Nausea or vomiting).  30 tablet  1  . prochlorperazine (COMPAZINE) 25 MG suppository Place 1 suppository (25 mg total) rectally every 12 (twelve) hours as needed for nausea.  12 suppository  3  . SF 5000 PLUS 1.1 % CREA dental cream Place 1.1 % onto teeth at bedtime.       [name] trimethoprim (TRIMPEX) 100 MG tablet Take 100 mg by mouth every morning.       . valACYclovir (VALTREX) 500 MG tablet Take 1 tablet (500 mg total) by mouth daily.  30 tablet  7  . venlafaxine XR (EFFEXOR-XR) 75 MG 24 hr capsule Take 75 mg  Orally daily  30 capsule  8   No current facility-administered medications for this visit.   Facility-Administered Medications Ordered in Other Visits  Medication Dose Route Frequency Provider Last Rate Last Dose  . influenza  inactive virus vaccine (FLUZONE/FLUARIX) injection 0.5 mL  0.5 mL Intramuscular Once Joselyn Arrow, MD        SURGICAL HISTORY:  Past Surgical History  Procedure Laterality Date  . Breast lumpectomy  04/2009    left  . Cesarean section  1973; 1976  . Muscle release  1960    L neck and chest.; "when I was 13; pneumonia settled in my left neck"  . Coronary artery bypass graft  08/18/2011    Procedure: CORONARY ARTERY BYPASS GRAFTING (CABG);  Surgeon: Alleen Borne, MD;  Location: [facility] OR;  Service: Open Heart Surgery;  Laterality: N/A;  Coronary Artery Bypass Graft times five utilizing the left internal mammary artery and the left greater saphenous vein harvested endoscopically.  . Abdominal hysterectomy  1976  . Appendectomy  1976  . Portacath placement  01/06/2012    Procedure: INSERTION PORT-A-CATH;  Surgeon: Alleen Borne, MD;  Location: [facility] OR;  Service: Thoracic;  Laterality: Left;  . Chest tube insertion  01/06/2012    Procedure: INSERTION PLEURAL DRAINAGE CATHETER;  Surgeon: Alleen Borne, MD;  Location: [facility] OR;  Service: Thoracic;   Laterality: Bilateral;  . Removal of pleural drainage catheter Right 04/12/2012    Procedure: MINOR REMOVAL OF PLEURAL DRAINAGE CATHETER;  Surgeon: Alleen Borne, MD;  Location: [facility] OR;  Service: Thoracic;  Laterality: Right;  . Talc pleurodesis Left 04/12/2012    Procedure: Lurlean Nanny;  Surgeon: Alleen Borne, MD;  Location: [facility] OR;  Service: Thoracic;  Laterality: Left;  . Portacath placement Left 04/17/2012    Procedure: INSERTION PORT-A-CATH;  Surgeon: Alleen Borne, MD;  Location: [facility] OR;  Service: Thoracic;  Laterality: Left;  . Removal of pleural drainage catheter Left 04/17/2012    Procedure: REMOVAL OF PLEURAL DRAINAGE CATHETER;  Surgeon: Alleen Borne, MD;  Location: [facility] OR;  Service: Thoracic;  Laterality: Left;  . Port-a-cath removal Left 04/17/2012    Procedure: REMOVAL PORT-A-CATH;  Surgeon: Alleen Borne, MD;  Location: [facility] OR;  Service: Thoracic;  Laterality: Left;  . Cardiac catheterization    . Laparotomy Bilateral 05/30/2012    Procedure: Resection of umbilical mass, Partial omentectomy;  Surgeon: Jeannette Corpus, MD;  Location: [facility] ORS;  Service: Gynecology;  Laterality: Bilateral;    REVIEW OF SYSTEMS:   A 10 point review of systems was completed and is negative except as noted above.  PHYSICAL EXAMINATION:  BP 108/66  Pulse 85  Temp(Src) 98 F (36.7 C) (Oral)  Resp 18  Ht 5\' 6"  (1.676 m)  Wt 153 lb 8 oz (69.627 kg)  BMI 24.79 kg/m2   General appearance: Alert, cooperative, thin frame no apparent distress Head: Normocephalic, without obvious abnormality, atraumatic Eyes: Arcus senilis, PERRLA, EOMI Nose: Nares, septum and mucosa are normal, no drainage or sinus tenderness Neck: No adenopathy, supple, symmetrical, trachea midline, no tenderness Resp: Clear to auscultation bilaterally Cardio: Regular rate and rhythm, S1, S2 normal, 1/6 murmur, no click, rub or gallop, left chest Port-A-Cath covered and EMLA cream Breasts:  Deferred GI: Soft, slight  distention, non-tender, hypoactive bowel sounds, no organomegaly Extremities: Extremities normal, atraumatic, no cyanosis or edema Lymph nodes: Cervical and supraclavicular lymph  nodes are normal Neurologic: Grossly normal ECOG PERFORMANCE STATUS: 1 - Symptomatic but completely ambulatory.   LABORATORY DATA: Lab Results  Component Value Date   WBC 4.4 10/23/2012   HGB 9.1* 10/23/2012   HCT 30.2* 10/23/2012   MCV 96.5 10/23/2012   PLT 375 10/23/2012      Chemistry      Component Value Date/Time   NA 143 10/23/2012 0902   NA 134* 06/02/2012 0515   NA 141 12/17/2011 1144   K 4.6 10/23/2012 0902   K 4.4 06/02/2012 0515   CL 103 08/07/2012 1108   CL 100 06/02/2012 0515   CO2 24 10/23/2012 0902   CO2 25 06/02/2012 0515   BUN 14.4 10/23/2012 0902   BUN 20 06/02/2012 0515   BUN 15 12/17/2011 1144   CREATININE 0.7 10/23/2012 0902   CREATININE 0.76 06/02/2012 0515      Component Value Date/Time   CALCIUM 9.3 10/23/2012 0902   CALCIUM 8.7 06/02/2012 0515   ALKPHOS 88 10/23/2012 0902   ALKPHOS 85 05/26/2012 0955   AST 21 10/23/2012 0902   AST 22 05/26/2012 0955   ALT 24 10/23/2012 0902   ALT 16 05/26/2012 0955   BILITOT 0.34 10/23/2012 0902   BILITOT 0.3 05/26/2012 0955       RADIOGRAPHIC STUDIES: Dg Chest 2 View 08/30/2012   *RADIOLOGY REPORT*  Clinical Data: Shortness of breath, history metastatic ovarian carcinoma and history of bilateral pleural effusions.  CHEST - 2 VIEW  Comparison: 05/26/2012  Findings: There is significant diminishment in bilateral pleural effusions since the prior study with only trace amount of pleural fluid present bilaterally.  The lungs show no evidence of focal consolidation, masses or pulmonary edema.  Port-A-Cath positioning stable in the SVC.  The heart size is normal.  No bony lesions are seen.  IMPRESSION: Significant diminishment in bilateral pleural effusions since the prior chest x-ray. Trace bilateral pleural effusions remain.  No acute findings.   Original Report Authenticated By:  Irish Lack, M.D.     ASSESSMENT: 67 year old woman:   #1 History of DCIS of the left breast status post lumpectomy in 04/2009.  Status post radiation therapy completed in 06/2009.  Was on antiestrogen therapy with Tamoxifen 20 mg by mouth daily that was started in 09/2009.  Tamoxifen has been discontinued for now due to her recent diagnosis of ovarian cancer.  #2 Gynecologic malignancy (ovarian carcinoma) diagnosed in 12/2011. Patient received 6 cycles of neoadjuvant chemotherapy consisting of Taxol/Carboplatinum completed in 06/2012.  #3 She is now status post laparotomy performed on 05/30/2012. Unfortunately, intraoperatively she was found to have significant residual disease. 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   #4 Patient completed chemotherapy sensitivity testing. Unfortunately the results were inconclusive.  It was decided to proceed with chemotherapy consisting of Topotecan and Avastin.  Dr. Grant Ruts also recommended this treatment regimen.  #5 Patient was started chemotherapy consisting of Topotecan and Avastin  With Topotecan q week on day 1, 8,15 on a 28 day cycle, and Avastin scheduled for every 3 weeks.  #6 Depression/anxiety: Currently on Effexor with good response  #7 Shortness of breath: patient has been seen by cardilogist Dr. Elease Hashimoto and cardiac etiology was ruled out.  She will f/u with Dr. Shelle Iron soon.  We will call and try to expedite this.    #8 History of dehydration -patient instructed to drink plenty of water  #9  Cold sores/herpes labialis.  #  10 Neuropathy - currently on Gabapentin 100 mg by mouth 3 times a day.   PLAN: #1  Doing well.  Patient will proceed with treatment today with Topotecan/Avastin.      #2 She will continue Valtrex for her mouth and magic mouthwash as needed.   #3 She will return next week for labs, evaluation and treatment.    #4 She will  continue using flonase and saline nasal spray for her nose.  Her nasal drainage is improved.   #5 We will call Dr. Teddy Spike office in an attempt to expedite her pulmonary evaluation.      All questions were answered.  Patient was encouraged to contact us with any questions, problems or concerns.   I spent 25 minutes counseling the patient face to face.  The total time spent in the appointment was 30 minutes.  Cherie Ouch Lyn Hollingshead, NP Medical Oncology [facility] Phone: [phone] 10/25/2012  8:32 AM

## 2012-10-23 NOTE — Patient Instructions (Signed)
Doing well.  Proceed with treatment today.  Please call us if you have any questions or concerns.

## 2012-10-23 NOTE — Patient Instructions (Addendum)
[facility] Discharge Instructions for Patients Receiving Chemotherapy  Today you received the following chemotherapy agents: Avastin, Topotecan   To help prevent nausea and vomiting after your treatment, we encourage you to take your nausea medication as directed by your physician.   If you develop nausea and vomiting that is not controlled by your nausea medication, call the clinic.   BELOW ARE SYMPTOMS THAT SHOULD BE REPORTED IMMEDIATELY:  *FEVER GREATER THAN 100.5 F  *CHILLS WITH OR WITHOUT FEVER  NAUSEA AND VOMITING THAT IS NOT CONTROLLED WITH YOUR NAUSEA MEDICATION  *UNUSUAL SHORTNESS OF BREATH  *UNUSUAL BRUISING OR BLEEDING  TENDERNESS IN MOUTH AND THROAT WITH OR WITHOUT PRESENCE OF ULCERS  *URINARY PROBLEMS  *BOWEL PROBLEMS  UNUSUAL RASH Items with * indicate a potential emergency and should be followed up as soon as possible.  Feel free to call the clinic you have any questions or concerns. The clinic phone number is [phone].

## 2012-10-29 NOTE — Progress Notes (Signed)
[facility]  [phone] [phone]  OFFICE PROGRESS NOTE   PCP: Elesa Massed, M.D. SU: Emelia Loron, M.D. RAD ONC: Antony Blackbird, M.D.   DIAGNOSIS: [name] [name] is a 67 year-old female with a history of ductal carcinoma in situ of the left breast diagnosed 04/2009 and ovarian cancer diagnosed in 12/2011.   PRIOR THERAPY: #1. 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 Patient had 3 sentinel biopsied all of them were negative for metastatic disease. Patient underwent radiation therapy between 06/05/2009 through 07/02/2009. She was then begun on Tamoxifen 20 mg daily in 09/2009.  #2  In 12/2011 she received the diagnosis of gynecologic malignancy presenting with abdominal mass and pleural effusions. Patient was originally hospitalized with shortness of breath in 12/2011.  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 She also had malignant ascites.  She underwent bilateral thoracentesis and paracentesis procedures during her hospitalization. During her hospitalization she was seen by gynecologic oncology.  #3 Patient began neoadjuvant chemotherapy consisting of Taxol and Carboplatinum. Her first cycle was administered during her hospitalization. She has completed a total of 6 cycles of Taxol/Carboplatinum from 01/07/2012 - 06/23/2012. Overall she tolerated it well except for some fatigue and neuropathies.  #4 Patient is status post laparotomy in 05/2012 that revealed significant residual disease.   #5  Patient began adjuvant therapy with Topotecan/Avastin beginning on 07/24/2012.   CURRENT THERAPY: s/p Topotecan/Avastin therapy cycle 3 day 15   INTERVAL HISTORY:  [name] [name] 67 y.o. female returns today for followup of ovarian cancer.   She is doing well today.   She has no epistaxis.  She has  no sinus pain, or tenderness, no fevers, chills, nausea, vomiting, constipation.  Her mouth continues to improve and her numbness is stable.  Otherwise, a 10 point ROS is negative.     MEDICAL HISTORY: Past Medical History  Diagnosis Date  . Interstitial cystitis     on chronic antibiotics  . Hyperlipidemia   . Frequent UTI     on prophylaxis  . 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 CAD (coronary artery disease)     a. s/p CABG 7/13;   b.  LHC 12/01/11:  pLAD 70%, mLAD 40%, CFX 40-50% prior to takeoff of the OM2, oRCA occluded, mid vessel filled via R->R collaterals and distal vessel filled by L->R collaterals, S-OM1/OM2 (small and diffusely dz) with mid 90% stenosis, 90% at OM1 anastomotic site, continuation of OM2 occluded, S-Dx patent, S-PDA occluded, L-LAD ok, EF 55-65%  => Med Rx rec.  [name] Hx of echocardiogram     Echo 5/13: EF 60-65%, grade 2 diastolic dysfunction  . Hypercholesterolemia   . Pleural effusion 12/28/11    s/p pleurx catheter  . GERD (gastroesophageal reflux disease)   . Arthritis     "just a little; lower back" (12/28/11)  . Gout attack 08/2011    related to "stress post OHS"  . Depression   . Breast cancer     "left"; on Tamoxifen  . S/P thoracentesis 12/28/11    "for pleural effusion" (12/28/2011)  . Ovarian cancer   . Tachycardia   . Neuromuscular disorder     peripheral neuropathy    ALLERGIES:  is allergic to codeine; isosorbide; other; phenothiazines; talwin; yellow jacket venom; ambien; and tegaderm ag mesh.  MEDICATIONS:  Current Outpatient Prescriptions  Medication Sig Dispense  Refill  . ALPRAZolam (XANAX) 0.25 MG tablet Take 1 tablet (0.25 mg total) by mouth every 8 (eight) hours as needed for anxiety.  30 tablet  4  . Alum & Mag Hydroxide-Simeth (MAGIC MOUTHWASH) SOLN Take 5 mLs by mouth 4 (four) times daily as needed (mouth sores).      [name] aspirin 325 MG tablet Take 325 mg by mouth at bedtime.       [name] atorvastatin (LIPITOR) 20 MG tablet TAKE ONE TABLET  BY MOUTH ONCE DAILY  90 tablet  3  . carvedilol (COREG) 25 MG tablet Take 1 tablet (25 mg total) by mouth 2 (two) times daily.  180 tablet  3  . dexamethasone (DECADRON) 4 MG tablet Take 2 tablets (8 mg total) by mouth 2 (two) times daily with a meal. Take daily starting the day after chemotherapy for 2 days. Take with food.  30 tablet  1  . docusate sodium (COLACE) 100 MG capsule Take 200 mg by mouth daily.      [name] EPINEPHrine (EPIPEN 2-PAK) 0.3 mg/0.3 mL DEVI Inject 0.3 mg into the muscle daily as needed (allergic reaction).       . fluticasone (FLONASE) 50 MCG/ACT nasal spray Place 2 sprays into the nose daily as needed for allergies.      [name] gabapentin (NEURONTIN) 100 MG capsule Take 100 mg orally three times a day  90 capsule  6  . HYDROcodone-acetaminophen (NORCO/VICODIN) 5-325 MG per tablet Take 1-2 tablets by mouth every 4 (four) hours as needed.  40 tablet  2  . lidocaine-prilocaine (EMLA) cream Apply topically as needed.  30 g  1  . nitroGLYCERIN (NITROSTAT) 0.4 MG SL tablet Place 0.4 mg under the tongue every 5 (five) minutes as needed. Chest pain      . omeprazole (PRILOSEC) 20 MG capsule Take 1 capsule (20 mg total) by mouth 2 (two) times daily.  60 capsule  12  . ondansetron (ZOFRAN) 8 MG tablet Take 1 tablet (8 mg total) by mouth 2 (two) times daily. Take two times a day starting the day after chemo for 2 days. Then take two times a day as needed for nausea or vomiting.  30 tablet  1  . oxybutynin (DITROPAN-XL) 10 MG 24 hr tablet Take 10 mg by mouth daily.      Bertram Gala Glycol-Propyl Glycol 0.4-0.3 % SOLN Place 1 drop into both eyes every morning.       . prochlorperazine (COMPAZINE) 10 MG tablet Take 1 tablet (10 mg total) by mouth every 6 (six) hours as needed (Nausea or vomiting).  30 tablet  1  . SF 5000 PLUS 1.1 % CREA dental cream Place 1.1 % onto teeth at bedtime.       . valACYclovir (VALTREX) 500 MG tablet Take 1 tablet (500 mg total) by mouth daily.  30 tablet  7  .  venlafaxine XR (EFFEXOR-XR) 75 MG 24 hr capsule Take 75 mg  Orally daily  30 capsule  8  . LORazepam (ATIVAN) 0.5 MG tablet Take 1 tablet (0.5 mg total) by mouth every 6 (six) hours as needed (Nausea or vomiting).  30 tablet  0  . prochlorperazine (COMPAZINE) 25 MG suppository Place 1 suppository (25 mg total) rectally every 12 (twelve) hours as needed for nausea.  12 suppository  3  . trimethoprim (TRIMPEX) 100 MG tablet Take 100 mg by mouth every morning.        No current facility-administered medications for this visit.  Facility-Administered Medications Ordered in Other Visits  Medication Dose Route Frequency Provider Last Rate Last Dose  . influenza  inactive virus vaccine (FLUZONE/FLUARIX) injection 0.5 mL  0.5 mL Intramuscular Once Joselyn Arrow, MD        SURGICAL HISTORY:  Past Surgical History  Procedure Laterality Date  . Breast lumpectomy  04/2009    left  . Cesarean section  1973; 1976  . Muscle release  1960    L neck and chest.; "when I was 13; pneumonia settled in my left neck"  . Coronary artery bypass graft  08/18/2011    Procedure: CORONARY ARTERY BYPASS GRAFTING (CABG);  Surgeon: Alleen Borne, MD;  Location: [facility] OR;  Service: Open Heart Surgery;  Laterality: N/A;  Coronary Artery Bypass Graft times five utilizing the left internal mammary artery and the left greater saphenous vein harvested endoscopically.  . Abdominal hysterectomy  1976  . Appendectomy  1976  . Portacath placement  01/06/2012    Procedure: INSERTION PORT-A-CATH;  Surgeon: Alleen Borne, MD;  Location: [facility] OR;  Service: Thoracic;  Laterality: Left;  . Chest tube insertion  01/06/2012    Procedure: INSERTION PLEURAL DRAINAGE CATHETER;  Surgeon: Alleen Borne, MD;  Location: [facility] OR;  Service: Thoracic;  Laterality: Bilateral;  . Removal of pleural drainage catheter Right 04/12/2012    Procedure: MINOR REMOVAL OF PLEURAL DRAINAGE CATHETER;  Surgeon: Alleen Borne, MD;  Location: [facility] OR;  Service: Thoracic;   Laterality: Right;  . Talc pleurodesis Left 04/12/2012    Procedure: Lurlean Nanny;  Surgeon: Alleen Borne, MD;  Location: [facility] OR;  Service: Thoracic;  Laterality: Left;  . Portacath placement Left 04/17/2012    Procedure: INSERTION PORT-A-CATH;  Surgeon: Alleen Borne, MD;  Location: [facility] OR;  Service: Thoracic;  Laterality: Left;  . Removal of pleural drainage catheter Left 04/17/2012    Procedure: REMOVAL OF PLEURAL DRAINAGE CATHETER;  Surgeon: Alleen Borne, MD;  Location: [facility] OR;  Service: Thoracic;  Laterality: Left;  . Port-a-cath removal Left 04/17/2012    Procedure: REMOVAL PORT-A-CATH;  Surgeon: Alleen Borne, MD;  Location: [facility] OR;  Service: Thoracic;  Laterality: Left;  . Cardiac catheterization    . Laparotomy Bilateral 05/30/2012    Procedure: Resection of umbilical mass, Partial omentectomy;  Surgeon: Jeannette Corpus, MD;  Location: [facility] ORS;  Service: Gynecology;  Laterality: Bilateral;    REVIEW OF SYSTEMS:   A 10 point review of systems was completed and is negative except as noted above.  PHYSICAL EXAMINATION:  BP 108/68  Pulse 92  Temp(Src) 98.2 F (36.8 C) (Oral)  Resp 20  Ht 5\' 6"  (1.676 m)  Wt 151 lb 8 oz (68.72 kg)  BMI 24.46 kg/m2   General appearance: Alert, cooperative, thin frame no apparent distress Head: Normocephalic, without obvious abnormality, atraumatic Eyes: Arcus senilis, PERRLA, EOMI Nose: Nares, septum and mucosa are normal, no drainage or sinus tenderness Neck: No adenopathy, supple, symmetrical, trachea midline, no tenderness Resp: Clear to auscultation bilaterally Cardio: Regular rate and rhythm, S1, S2 normal, 1/6 murmur, no click, rub or gallop, left chest Port-A-Cath covered and EMLA cream Breasts:  Deferred GI: Soft, slight distention, non-tender, hypoactive bowel sounds, no organomegaly Extremities: Extremities normal, atraumatic, no cyanosis or edema Lymph nodes: Cervical and supraclavicular lymph nodes are normal Neurologic:  Grossly normal ECOG PERFORMANCE STATUS: 1 - Symptomatic but completely ambulatory.   LABORATORY DATA: Lab Results  Component Value Date   WBC 4.4 10/23/2012   HGB  9.1* 10/23/2012   HCT 30.2* 10/23/2012   MCV 96.5 10/23/2012   PLT 375 10/23/2012      Chemistry      Component Value Date/Time   NA 143 10/23/2012 0902   NA 134* 06/02/2012 0515   NA 141 12/17/2011 1144   K 4.6 10/23/2012 0902   K 4.4 06/02/2012 0515   CL 103 08/07/2012 1108   CL 100 06/02/2012 0515   CO2 24 10/23/2012 0902   CO2 25 06/02/2012 0515   BUN 14.4 10/23/2012 0902   BUN 20 06/02/2012 0515   BUN 15 12/17/2011 1144   CREATININE 0.7 10/23/2012 0902   CREATININE 0.76 06/02/2012 0515      Component Value Date/Time   CALCIUM 9.3 10/23/2012 0902   CALCIUM 8.7 06/02/2012 0515   ALKPHOS 88 10/23/2012 0902   ALKPHOS 85 05/26/2012 0955   AST 21 10/23/2012 0902   AST 22 05/26/2012 0955   ALT 24 10/23/2012 0902   ALT 16 05/26/2012 0955   BILITOT 0.34 10/23/2012 0902   BILITOT 0.3 05/26/2012 0955       RADIOGRAPHIC STUDIES: Dg Chest 2 View 08/30/2012   *RADIOLOGY REPORT*  Clinical Data: Shortness of breath, history metastatic ovarian carcinoma and history of bilateral pleural effusions.  CHEST - 2 VIEW  Comparison: 05/26/2012  Findings: There is significant diminishment in bilateral pleural effusions since the prior study with only trace amount of pleural fluid present bilaterally.  The lungs show no evidence of focal consolidation, masses or pulmonary edema.  Port-A-Cath positioning stable in the SVC.  The heart size is normal.  No bony lesions are seen.  IMPRESSION: Significant diminishment in bilateral pleural effusions since the prior chest x-ray. Trace bilateral pleural effusions remain.  No acute findings.   Original Report Authenticated By: Irish Lack, M.D.     ASSESSMENT: 67 year old woman:   #1 History of DCIS of the left breast status post lumpectomy in 04/2009.  Status post radiation therapy completed in 06/2009.  Was on antiestrogen  therapy with Tamoxifen 20 mg by mouth daily that was started in 09/2009.  Tamoxifen has been discontinued for now due to her recent diagnosis of ovarian cancer.  #2 Gynecologic malignancy (ovarian carcinoma) diagnosed in 12/2011. Patient received 6 cycles of neoadjuvant chemotherapy consisting of Taxol/Carboplatinum completed in 06/2012.  #3 She is now status post laparotomy performed on 05/30/2012. Unfortunately, intraoperatively she was found to have significant residual disease. 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   #4 Patient completed chemotherapy sensitivity testing. Unfortunately the results were inconclusive.  It was decided to proceed with chemotherapy consisting of Topotecan and Avastin.  Dr. Grant Ruts also recommended this treatment regimen.  #5 Patient was started chemotherapy consisting of Topotecan and Avastin  With Topotecan q week on day 1, 8,15 on a 28 day cycle, and Avastin scheduled for every 3 weeks.  #6 Depression/anxiety: Currently on Effexor with good response  #7 Neutropenia resolved due to Neupogen  #8 History of dehydration -patient instructed to drink plenty of water  #9  Cold sores/herpes labialis.  #10 Neuropathy - currently on Gabapentin 100 mg by mouth 3 times a day.   PLAN: #1  Doing well.  Hold chemotherapy today due tolow counts  #2 She will continue Valtrex for her mouth and magic mouthwash as needed.   #3 She will return next week for labs and  Evaluation.  I requested several more appointments be scheduled  through October.    #4 She will continue using flonase and saline nasal spray for her nose.    All questions were answered.  Patient was encouraged to contact us with any questions, problems or concerns.   I spent 25 minutes counseling the patient face to face.  The total time spent in the appointment was 30 minutes.  Drue Second,  MD Medical/Oncology [facility] [phone] (beeper) [phone] (Office)

## 2012-10-30 ENCOUNTER — Encounter: Payer: Self-pay | Admitting: Adult Health

## 2012-10-30 ENCOUNTER — Other Ambulatory Visit: Payer: Medicare Other | Admitting: Lab

## 2012-10-30 ENCOUNTER — Ambulatory Visit: Payer: Medicare Other

## 2012-10-30 ENCOUNTER — Other Ambulatory Visit (HOSPITAL_BASED_OUTPATIENT_CLINIC_OR_DEPARTMENT_OTHER): Payer: Medicare Other | Admitting: Lab

## 2012-10-30 ENCOUNTER — Ambulatory Visit (HOSPITAL_BASED_OUTPATIENT_CLINIC_OR_DEPARTMENT_OTHER): Payer: Medicare Other

## 2012-10-30 ENCOUNTER — Ambulatory Visit (HOSPITAL_BASED_OUTPATIENT_CLINIC_OR_DEPARTMENT_OTHER): Payer: Medicare Other | Admitting: Adult Health

## 2012-10-30 VITALS — BP 110/68 | HR 94 | Temp 97.6°F | Resp 18 | Ht 66.0 in | Wt 153.4 lb

## 2012-10-30 DIAGNOSIS — C786 Secondary malignant neoplasm of retroperitoneum and peritoneum: Secondary | ICD-10-CM

## 2012-10-30 DIAGNOSIS — Z5111 Encounter for antineoplastic chemotherapy: Secondary | ICD-10-CM

## 2012-10-30 DIAGNOSIS — B009 Herpesviral infection, unspecified: Secondary | ICD-10-CM

## 2012-10-30 DIAGNOSIS — C569 Malignant neoplasm of unspecified ovary: Secondary | ICD-10-CM

## 2012-10-30 DIAGNOSIS — R0602 Shortness of breath: Secondary | ICD-10-CM

## 2012-10-30 LAB — COMPREHENSIVE METABOLIC PANEL (CC13)
ALT: 23 U/L (ref 0–55)
AST: 23 U/L (ref 5–34)
Albumin: 3.2 g/dL — ABNORMAL LOW (ref 3.5–5.0)
Alkaline Phosphatase: 76 U/L (ref 40–150)
BUN: 19 mg/dL (ref 7.0–26.0)
Calcium: 9.2 mg/dL (ref 8.4–10.4)
Chloride: 107 mEq/L (ref 98–109)
Potassium: 4 mEq/L (ref 3.5–5.1)

## 2012-10-30 LAB — CBC WITH DIFFERENTIAL/PLATELET
BASO%: 1.8 % (ref 0.0–2.0)
EOS%: 1.6 % (ref 0.0–7.0)
MCH: 29.6 pg (ref 25.1–34.0)
MCHC: 30.9 g/dL — ABNORMAL LOW (ref 31.5–36.0)
MCV: 95.6 fL (ref 79.5–101.0)
MONO%: 9.6 % (ref 0.0–14.0)
RBC: 3.18 10*6/uL — ABNORMAL LOW (ref 3.70–5.45)
RDW: 22.3 % — ABNORMAL HIGH (ref 11.2–14.5)
lymph#: 1.2 10*3/uL (ref 0.9–3.3)

## 2012-10-30 MED ORDER — DEXAMETHASONE SODIUM PHOSPHATE 10 MG/ML IJ SOLN
10.0000 mg | Freq: Once | INTRAMUSCULAR | Status: AC
  Administered 2012-10-30: 10 mg via INTRAVENOUS

## 2012-10-30 MED ORDER — HEPARIN SOD (PORK) LOCK FLUSH 100 UNIT/ML IV SOLN
500.0000 [IU] | Freq: Once | INTRAVENOUS | Status: AC | PRN
  Administered 2012-10-30: 500 [IU]
  Filled 2012-10-30: qty 5

## 2012-10-30 MED ORDER — ONDANSETRON 8 MG/50ML IVPB (CHCC)
8.0000 mg | Freq: Once | INTRAVENOUS | Status: AC
  Administered 2012-10-30: 8 mg via INTRAVENOUS

## 2012-10-30 MED ORDER — SODIUM CHLORIDE 0.9 % IV SOLN
4.0000 mg/m2 | Freq: Once | INTRAVENOUS | Status: AC
  Administered 2012-10-30: 7 mg via INTRAVENOUS
  Filled 2012-10-30: qty 7

## 2012-10-30 MED ORDER — SODIUM CHLORIDE 0.9 % IV SOLN
Freq: Once | INTRAVENOUS | Status: AC
  Administered 2012-10-30: 16:00:00 via INTRAVENOUS

## 2012-10-30 MED ORDER — DEXAMETHASONE SODIUM PHOSPHATE 10 MG/ML IJ SOLN
INTRAMUSCULAR | Status: AC
  Filled 2012-10-30: qty 1

## 2012-10-30 MED ORDER — SODIUM CHLORIDE 0.9 % IJ SOLN
10.0000 mL | INTRAMUSCULAR | Status: DC | PRN
  Administered 2012-10-30: 10 mL
  Filled 2012-10-30: qty 10

## 2012-10-30 MED ORDER — ONDANSETRON 8 MG/NS 50 ML IVPB
INTRAVENOUS | Status: AC
  Filled 2012-10-30: qty 8

## 2012-10-30 NOTE — Patient Instructions (Signed)
[facility] Discharge Instructions for Patients Receiving Chemotherapy  Today you received the following chemotherapy agents Topotecan injection What is this medicine? TOPOTECAN (TOE poe TEE kan) is a chemotherapy drug. It is used to treat lung cancer, ovarian cancer, and cervical cancer. This medicine may be used for other purposes; ask your health care provider or pharmacist if you have questions. What should I tell my health care provider before I take this medicine? They need to know if you have any of these conditions: -blood disorders -dehydration -diarrhea -immune system problems -infection (especially a virus infection such as chickenpox, cold sores, or herpes) -kidney disease -low blood counts, like low white cell, platelet, or red cell counts -recent or ongoing radiation therapy -an unusual or allergic reaction to topotecan, other medicines, foods, dyes, or preservatives -pregnant or trying to get pregnant -breast-feeding How should I use this medicine? This medicine is for infusion into a vein. It is usually given by a health care professional in a hospital or clinic setting. In rare cases, you might get this medicine at home. You will be taught how to give this medicine. Use exactly as directed. Take your medicine at regular intervals. Do not take your medicine more often than directed. It is important that you put your used needles and syringes in a special sharps container. Do not put them in a trash can. If you do not have a sharps container, call your pharmacist or healthcare provider to get one. Talk to your pediatrician regarding the use of this medicine in children. Special care may be needed. Overdosage: If you think you have taken too much of this medicine contact a poison control center or emergency room at once. NOTE: This medicine is only for you. Do not share this medicine with others. What if I miss a dose? It is important not to miss your dose.  Call your doctor or health care professional if you are unable to keep an appointment. What may interact with this medicine? -amiodarone -antiviral medicines for HIV or AIDS -cisplatin -clarithromycin -cyclosporine -diltiazem -erythromycin -grapefruit or grapefruit juice -medicines for fungal infections like ketoconazole and itraconazole -mefloquine -mifepristone, RU-486 -nicardipine -phenytoin -propafenone -quinidine -tacrolimus -tamoxifen -testosterone -vaccines -verapamil Talk to your prescriber or health care professional before taking any of these medicines: -aspirin -acetaminophen -ibuprofen -naproxen -ketoprofen This list may not describe all possible interactions. Give your health care provider a list of all the medicines, herbs, non-prescription drugs, or dietary supplements you use. Also tell them if you smoke, drink alcohol, or use illegal drugs. Some items may interact with your medicine. What should I watch for while using this medicine? This drug may make you feel generally unwell. This is not uncommon, as chemotherapy can affect healthy cells as well as cancer cells. Report any side effects. Continue your course of treatment even though you feel ill unless your doctor tells you to stop. Call your doctor or health care professional for advice if you get a fever, chills or sore throat, or other symptoms of a cold or flu. Do not treat yourself. This drug decreases your body's ability to fight infections. Try to avoid being around people who are sick. This medicine may increase your risk to bruise or bleed. Call your doctor or health care professional if you notice any unusual bleeding. Be careful brushing and flossing your teeth or using a toothpick because you may get an infection or bleed more easily. If you have any dental work done, tell your dentist you  are receiving this medicine. Avoid taking products that contain aspirin, acetaminophen, ibuprofen, naproxen, or  ketoprofen unless instructed by your doctor. These medicines may hide a fever. Do not become pregnant while taking this medicine. Women should inform their doctor if they wish to become pregnant or think they might be pregnant. There is a potential for serious side effects to an unborn child. Talk to your health care professional or pharmacist for more information. Do not breast-feed an infant while taking this medicine. What side effects may I notice from receiving this medicine? Side effects that you should report to your doctor or health care professional as soon as possible: -allergic reactions like skin rash, itching or hives, swelling of the face, lips, or tongue -breathing difficulties -diarrhea -dizziness -fever or chills, sore throat -mouth sores or pain -pain, tingling, numbness in the hands or feet -unusual bleeding or bruising -unusually weak or tired -yellowing of the eyes or skin Side effects that usually do not require medical attention (report to your doctor or health care professional if they continue or are bothersome): -hair loss -headache -loss of appetite -nausea, vomiting -stomach pain This list may not describe all possible side effects. Call your doctor for medical advice about side effects. You may report side effects to FDA at 1-800-FDA-1088. Where should I keep my medicine? Keep out of the reach of children. This drug is usually given in a hospital or clinic and will not be stored at home. In rare cases, this medicine may be given at home. If you are using this medicine at home, you will be instructed on how to store this medicine. Throw away any unused medicine after the expiration date on the label. NOTE: This sheet is a summary. It may not cover all possible information. If you have questions about this medicine, talk to your doctor, pharmacist, or health care provider.  2012, [employer] Standard. (10/18/2007 5:25:53 PM)  To help prevent nausea and vomiting  after your treatment, we encourage you to take your nausea medication Zofran 8mg  every 8 hours as needed for nausea or vomiting   If you develop nausea and vomiting that is not controlled by your nausea medication, call the clinic.   BELOW ARE SYMPTOMS THAT SHOULD BE REPORTED IMMEDIATELY:  *FEVER GREATER THAN 100.5 F  *CHILLS WITH OR WITHOUT FEVER  NAUSEA AND VOMITING THAT IS NOT CONTROLLED WITH YOUR NAUSEA MEDICATION  *UNUSUAL SHORTNESS OF BREATH  *UNUSUAL BRUISING OR BLEEDING  TENDERNESS IN MOUTH AND THROAT WITH OR WITHOUT PRESENCE OF ULCERS  *URINARY PROBLEMS  *BOWEL PROBLEMS  UNUSUAL RASH Items with * indicate a potential emergency and should be followed up as soon as possible.  Feel free to call the clinic you have any questions or concerns. The clinic phone number is [phone].

## 2012-10-30 NOTE — Patient Instructions (Signed)
Doing well.  Proceed with chemotherapy.  Please call us if you have any questions or concerns.    

## 2012-10-30 NOTE — Progress Notes (Signed)
Called her husband to pick her up at 1710.  Discharged,ambul;atory by herself to lobby

## 2012-10-30 NOTE — Progress Notes (Signed)
[facility]  Telephone:([phone] [phone]  OFFICE PROGRESS NOTE   PCP: Elesa Massed, M.D. SU: Emelia Loron, M.D. RAD ONC: Antony Blackbird, M.D.   DIAGNOSIS: [name] [name] is a 67 year-old female with a history of ductal carcinoma in situ of the left breast diagnosed 04/2009 and ovarian cancer diagnosed in 12/2011.   PRIOR THERAPY: #1. 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 Patient had 3 sentinel biopsied all of them were negative for metastatic disease. Patient underwent radiation therapy between 06/05/2009 through 07/02/2009. She was then begun on Tamoxifen 20 mg daily in 09/2009.  #2  In 12/2011 she received the diagnosis of gynecologic malignancy presenting with abdominal mass and pleural effusions. Patient was originally hospitalized with shortness of breath in 12/2011.  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 She also had malignant ascites.  She underwent bilateral thoracentesis and paracentesis procedures during her hospitalization. During her hospitalization she was seen by gynecologic oncology.  #3 Patient began neoadjuvant chemotherapy consisting of Taxol and Carboplatinum. Her first cycle was administered during her hospitalization. She has completed a total of 6 cycles of Taxol/Carboplatinum from 01/07/2012 - 06/23/2012. Overall she tolerated it well except for some fatigue and neuropathies.  #4 Patient is status post laparotomy in 05/2012 that revealed significant residual disease.   #5  Patient began adjuvant therapy with Topotecan/Avastin beginning on 07/24/2012.   CURRENT THERAPY: Topotecan/Avastin   INTERVAL HISTORY:  [name] [name] [name] [name] 67 y.o. female returns today for followup of ovarian cancer.   She is doing well today.  She does endorse shortness of breath.  She saw Dr. Welton Flakes  last week and was recommended to f/u with her cardiologist.  She has an appointment with pulmonology, however is concerned that it is in October and would like [place] to call and get her in sooner.  She is going out of town 9/27-10/4.  She continues to have mild numbness in her fingertips.  Otherwise, she denies fevers, chills, nausea,vomiting, constipation, diarrhea, pain or any further concerns.    MEDICAL HISTORY: Past Medical History  Diagnosis Date  . Interstitial cystitis     on chronic antibiotics  . Hyperlipidemia   . Frequent UTI     on prophylaxis  . 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 CAD (coronary artery disease)     a. s/p CABG 7/13;   b.  LHC 12/01/11:  pLAD 70%, mLAD 40%, CFX 40-50% prior to takeoff of the OM2, oRCA occluded, mid vessel filled via R->R collaterals and distal vessel filled by L->R collaterals, S-OM1/OM2 (small and diffusely dz) with mid 90% stenosis, 90% at OM1 anastomotic site, continuation of OM2 occluded, S-Dx patent, S-PDA occluded, L-LAD ok, EF 55-65%  => Med Rx rec.  [name] Hx of echocardiogram     Echo 5/13: EF 60-65%, grade 2 diastolic dysfunction  . Hypercholesterolemia   . Pleural effusion 12/28/11    s/p pleurx catheter  . GERD (gastroesophageal reflux disease)   . Arthritis     "just a little; lower back" (12/28/11)  . Gout attack 08/2011    related to "stress post OHS"  . Depression   . Breast cancer     "left"; on Tamoxifen  . S/P thoracentesis 12/28/11    "for pleural effusion" (12/28/2011)  . Ovarian cancer   . Tachycardia   . Neuromuscular disorder  peripheral neuropathy    ALLERGIES:  is allergic to codeine; isosorbide; other; phenothiazines; talwin; yellow jacket venom; ambien; and tegaderm ag mesh.  MEDICATIONS:  Current Outpatient Prescriptions  Medication Sig Dispense Refill  . ALPRAZolam (XANAX) 0.25 MG tablet Take 1 tablet (0.25 mg total) by mouth every 8 (eight) hours as needed for anxiety.  30 tablet  4  . Alum & Mag Hydroxide-Simeth  (MAGIC MOUTHWASH) SOLN Take 5 mLs by mouth 4 (four) times daily.       [name] aspirin 325 MG tablet Take 325 mg by mouth at bedtime.       [name] atorvastatin (LIPITOR) 20 MG tablet TAKE ONE TABLET BY MOUTH ONCE DAILY  90 tablet  3  . carvedilol (COREG) 25 MG tablet Take 1 tablet (25 mg total) by mouth 2 (two) times daily.  180 tablet  3  . dexamethasone (DECADRON) 4 MG tablet Take 2 tablets (8 mg total) by mouth 2 (two) times daily with a meal. Take daily starting the day after chemotherapy for 2 days. Take with food.  30 tablet  1  . docusate sodium (COLACE) 100 MG capsule Take 200 mg by mouth daily.      [name] EPINEPHrine (EPIPEN 2-PAK) 0.3 mg/0.3 mL DEVI Inject 0.3 mg into the muscle daily as needed (allergic reaction).       . fluticasone (FLONASE) 50 MCG/ACT nasal spray Place 2 sprays into the nose daily as needed for allergies.      [name] gabapentin (NEURONTIN) 100 MG capsule Take 100 mg orally three times a day  90 capsule  6  . HYDROcodone-acetaminophen (NORCO/VICODIN) 5-325 MG per tablet Take 1-2 tablets by mouth every 4 (four) hours as needed.  40 tablet  2  . lidocaine-prilocaine (EMLA) cream Apply topically as needed.  30 g  1  . nitroGLYCERIN (NITROSTAT) 0.4 MG SL tablet Place 0.4 mg under the tongue every 5 (five) minutes as needed. Chest pain      . omeprazole (PRILOSEC) 20 MG capsule Take 1 capsule (20 mg total) by mouth 2 (two) times daily.  60 capsule  12  . ondansetron (ZOFRAN) 8 MG tablet Take 1 tablet (8 mg total) by mouth 2 (two) times daily. Take two times a day starting the day after chemo for 2 days. Then take two times a day as needed for nausea or vomiting.  30 tablet  1  . oxybutynin (DITROPAN-XL) 10 MG 24 hr tablet Take 10 mg by mouth daily.      Bertram Gala Glycol-Propyl Glycol 0.4-0.3 % SOLN Place 1 drop into both eyes every morning.       . prochlorperazine (COMPAZINE) 25 MG suppository Place 1 suppository (25 mg total) rectally every 12 (twelve) hours as needed for nausea.  12  suppository  3  . SF 5000 PLUS 1.1 % CREA dental cream Place 1.1 % onto teeth at bedtime.       [name] trimethoprim (TRIMPEX) 100 MG tablet Take 100 mg by mouth every morning.       . valACYclovir (VALTREX) 500 MG tablet Take 1 tablet (500 mg total) by mouth daily.  30 tablet  7  . venlafaxine XR (EFFEXOR-XR) 75 MG 24 hr capsule Take 75 mg  Orally daily  30 capsule  8  . loratadine (CLARITIN) 10 MG tablet Take 10 mg by mouth daily.      [name] senna (SENOKOT) 8.6 MG tablet Take 1 tablet by mouth daily as needed for constipation.  No current facility-administered medications for this visit.   Facility-Administered Medications Ordered in Other Visits  Medication Dose Route Frequency Provider Last Rate Last Dose  . influenza  inactive virus vaccine (FLUZONE/FLUARIX) injection 0.5 mL  0.5 mL Intramuscular Once Joselyn Arrow, MD        SURGICAL HISTORY:  Past Surgical History  Procedure Laterality Date  . Breast lumpectomy  04/2009    left  . Cesarean section  1973; 1976  . Muscle release  1960    L neck and chest.; "when I was 13; pneumonia settled in my left neck"  . Coronary artery bypass graft  08/18/2011    Procedure: CORONARY ARTERY BYPASS GRAFTING (CABG);  Surgeon: Alleen Borne, MD;  Location: [facility] OR;  Service: Open Heart Surgery;  Laterality: N/A;  Coronary Artery Bypass Graft times five utilizing the left internal mammary artery and the left greater saphenous vein harvested endoscopically.  . Abdominal hysterectomy  1976  . Appendectomy  1976  . Portacath placement  01/06/2012    Procedure: INSERTION PORT-A-CATH;  Surgeon: Alleen Borne, MD;  Location[facility] OR;  Service: Thoracic;  Laterality: Left;  . Chest tube insertion  01/06/2012    Procedure: INSERTION PLEURAL DRAINAGE CATHETER;  Surgeon: Alleen Borne, MD;  Location: [facility] OR;  Service: Thoracic;  Laterality: Bilateral;  . Removal of pleural drainage catheter Right 04/12/2012    Procedure: MINOR REMOVAL OF PLEURAL DRAINAGE CATHETER;   Surgeon: Alleen Borne, MD;  Location: [facility] OR;  Service: Thoracic;  Laterality: Right;  . Talc pleurodesis Left 04/12/2012    Procedure: Lurlean Nanny;  Surgeon: Alleen Borne, MD;  Location: [facility] OR;  Service: Thoracic;  Laterality: Left;  . Portacath placement Left 04/17/2012    Procedure: INSERTION PORT-A-CATH;  Surgeon: Alleen Borne, MD;  Location: [facility] OR;  Service: Thoracic;  Laterality: Left;  . Removal of pleural drainage catheter Left 04/17/2012    Procedure: REMOVAL OF PLEURAL DRAINAGE CATHETER;  Surgeon: Alleen Borne, MD;  Location: [facility] OR;  Service: Thoracic;  Laterality: Left;  . Port-a-cath removal Left 04/17/2012    Procedure: REMOVAL PORT-A-CATH;  Surgeon: Alleen Borne, MD;  Location: [facility] OR;  Service: Thoracic;  Laterality: Left;  . Cardiac catheterization    . Laparotomy Bilateral 05/30/2012    Procedure: Resection of umbilical mass, Partial omentectomy;  Surgeon: Jeannette Corpus, MD;  Location: [facility] ORS;  Service: Gynecology;  Laterality: Bilateral;    REVIEW OF SYSTEMS:   A 10 point review of systems was completed and is negative except as noted above.  PHYSICAL EXAMINATION:  BP 110/68  Pulse 94  Temp(Src) 97.6 F (36.4 C) (Oral)  Resp 18  Ht 5\' 6"  (1.676 m)  Wt 153 lb 6.4 oz (69.582 kg)  BMI 24.77 kg/m2  SpO2 97%   General appearance: Alert, cooperative, thin frame no apparent distress Head: Normocephalic, without obvious abnormality, atraumatic Eyes: Arcus senilis, PERRLA, EOMI Nose: Nares, septum and mucosa are normal, no drainage or sinus tenderness Neck: No adenopathy, supple, symmetrical, trachea midline, no tenderness Resp: Clear to auscultation bilaterally Cardio: Regular rate and rhythm, S1, S2 normal, 1/6 murmur, no click, rub or gallop, left chest Port-A-Cath covered and EMLA cream Breasts:  Deferred GI: Soft, slight distention, non-tender, hypoactive bowel sounds, no organomegaly Extremities: Extremities normal, atraumatic, no cyanosis or  edema Lymph nodes: Cervical and supraclavicular lymph nodes are normal Neurologic: Grossly normal ECOG PERFORMANCE STATUS: 1 - Symptomatic but completely ambulatory.   LABORATORY DATA: Lab Results  Component Value Date   WBC 5.7 10/30/2012   HGB 9.4* 10/30/2012   HCT 30.4* 10/30/2012   MCV 95.6 10/30/2012   PLT 403* 10/30/2012      Chemistry      Component Value Date/Time   NA 140 10/30/2012 1351   NA 134* 06/02/2012 0515   NA 141 12/17/2011 1144   K 4.0 10/30/2012 1351   K 4.4 06/02/2012 0515   CL 103 08/07/2012 1108   CL 100 06/02/2012 0515   CO2 24 10/30/2012 1351   CO2 25 06/02/2012 0515   BUN 19.0 10/30/2012 1351   BUN 20 06/02/2012 0515   BUN 15 12/17/2011 1144   CREATININE 0.7 10/30/2012 1351   CREATININE 0.76 06/02/2012 0515      Component Value Date/Time   CALCIUM 9.2 10/30/2012 1351   CALCIUM 8.7 06/02/2012 0515   ALKPHOS 76 10/30/2012 1351   ALKPHOS 85 05/26/2012 0955   AST 23 10/30/2012 1351   AST 22 05/26/2012 0955   ALT 23 10/30/2012 1351   ALT 16 05/26/2012 0955   BILITOT 0.31 10/30/2012 1351   BILITOT 0.3 05/26/2012 0955       RADIOGRAPHIC STUDIES: Dg Chest 2 View 08/30/2012   *RADIOLOGY REPORT*  Clinical Data: Shortness of breath, history metastatic ovarian carcinoma and history of bilateral pleural effusions.  CHEST - 2 VIEW  Comparison: 05/26/2012  Findings: There is significant diminishment in bilateral pleural effusions since the prior study with only trace amount of pleural fluid present bilaterally.  The lungs show no evidence of focal consolidation, masses or pulmonary edema.  Port-A-Cath positioning stable in the SVC.  The heart size is normal.  No bony lesions are seen.  IMPRESSION: Significant diminishment in bilateral pleural effusions since the prior chest x-ray. Trace bilateral pleural effusions remain.  No acute findings.   Original Report Authenticated By: Irish Lack, M.D.     ASSESSMENT: 67 year old woman:   #1 History of DCIS of the left breast status  post lumpectomy in 04/2009.  Status post radiation therapy completed in 06/2009.  Was on antiestrogen therapy with Tamoxifen 20 mg by mouth daily that was started in 09/2009.  Tamoxifen has been discontinued for now due to her recent diagnosis of ovarian cancer.  #2 Gynecologic malignancy (ovarian carcinoma) diagnosed in 12/2011. Patient received 6 cycles of neoadjuvant chemotherapy consisting of Taxol/Carboplatinum completed in 06/2012.  #3 She is now status post laparotomy performed on 05/30/2012. Unfortunately, intraoperatively she was found to have significant residual disease. 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   #4 Patient completed chemotherapy sensitivity testing. Unfortunately the results were inconclusive.  It was decided to proceed with chemotherapy consisting of Topotecan and Avastin.  Dr. Grant Ruts also recommended this treatment regimen.  #5 Patient was started chemotherapy consisting of Topotecan and Avastin  With Topotecan q week on day 1, 8,15 on a 28 day cycle, and Avastin scheduled for every 3 weeks.  #6 Depression/anxiety: Currently on Effexor with good response  #7 Shortness of breath: patient has been seen by cardilogist Dr. Elease Hashimoto and cardiac etiology was ruled out.  She will f/u with Dr. Shelle Iron soon.  We will call and try to expedite this.    #8 History of dehydration -patient instructed to drink plenty of water  #9  Cold sores/herpes labialis.  #10 Neuropathy - currently on Gabapentin 100 mg by mouth 3 times a day.   PLAN: #1  Doing well.  Labs are stable, I reviewed them with her in detail.  Patient will proceed with treatment today with Topotecan.      #2 She will continue Valtrex for her mouth and magic mouthwash as needed.   #3 She will return next week for labs, evaluation and treatment.    #4 She will see Dr. Sherene Sires tomorrow for pulmonary evaluation.     All  questions were answered.  Patient was encouraged to contact us with any questions, problems or concerns.   I spent 25 minutes counseling the patient face to face.  The total time spent in the appointment was 30 minutes.  Cherie Ouch Lyn Hollingshead, NP Medical Oncology [facility] Phone: [phone] 10/31/2012  3:14 PM

## 2012-10-31 ENCOUNTER — Telehealth: Payer: Self-pay | Admitting: *Deleted

## 2012-10-31 ENCOUNTER — Ambulatory Visit (INDEPENDENT_AMBULATORY_CARE_PROVIDER_SITE_OTHER): Payer: Medicare Other | Admitting: Internal Medicine

## 2012-10-31 ENCOUNTER — Encounter: Payer: Self-pay | Admitting: Internal Medicine

## 2012-10-31 VITALS — BP 124/74 | HR 80 | Temp 97.0°F | Ht 66.0 in | Wt 154.8 lb

## 2012-10-31 DIAGNOSIS — R0600 Dyspnea, unspecified: Secondary | ICD-10-CM

## 2012-10-31 DIAGNOSIS — R0609 Other forms of dyspnea: Secondary | ICD-10-CM

## 2012-10-31 DIAGNOSIS — J90 Pleural effusion, not elsewhere classified: Secondary | ICD-10-CM

## 2012-10-31 DIAGNOSIS — R05 Cough: Secondary | ICD-10-CM | POA: Insufficient documentation

## 2012-10-31 NOTE — Telephone Encounter (Signed)
sw pt informed her that tx's will be added to her schedule. i also gv aptt for 12/11/12 w/ labs@ 1:15pm and ov@ 1:45pm w/ tx to follow. i emailed MW to add the tx....td

## 2012-10-31 NOTE — Patient Instructions (Addendum)
If the wheezing returns we will need to consider replacing the corevidol  Try prilosec 20mg   Take 30-60 min before first meal of the day and Pepcid 20 mg one bedtime until return  Please see patient coordinator before you leave today  to schedule sinus CT  GERD (REFLUX)  is an extremely common cause of respiratory symptoms, many times with no significant heartburn at all.    It can be treated with medication, but also with lifestyle changes including avoidance of late meals, excessive alcohol, smoking cessation, and avoid fatty foods, chocolate, peppermint, colas, red wine, and acidic juices such as orange juice.  NO MINT OR MENTHOL PRODUCTS SO NO COUGH DROPS  USE SUGARLESS CANDY INSTEAD (jolley ranchers or Stover's)  NO OIL BASED VITAMINS - use powdered substitutes.    Return to this office after your lung function tests

## 2012-10-31 NOTE — Telephone Encounter (Signed)
Per staff message and POF I have scheduled appts.  JMW  

## 2012-10-31 NOTE — Progress Notes (Signed)
  Subjective:    Patient ID: [name] [name], female    DOB: [birth year]  MRN: [id number]  HPI  80 yowf quit smoking 02/2001 with ex cp 2013 > CABG July 2013 then progressive sob dx with   ovarian ca with bilateral pl effusions 12/2011 required pleurovac on R and VATs/ pleurodesis on L completed Jan 2014 then chemo and did better until around early Aug 2014 with recurrent doe dx with pleural effusions referred 10/31/2012 to pulmonary clinic by Dr Park Breed  10/31/2012 f/u ov/Namrata Dangler cc variable sob x 6 weeks better if lie down in recliner at 30 degreee,assoc with pnds that is new and sometimes yellow and bloody, worse first thing in am. Sob can be just as bad at rest as with ex, brought on by talking.  No obvious day to day or daytime variabilty or assoc chronic cough or cp or chest tightness, subjective wheeze overt  r hb symptoms. No unusual exp hx or h/o childhood pna/ asthma or knowledge of premature birth.  Sleeping ok without nocturnal  or early am exacerbation  of respiratory  c/o's or need for noct saba. Also denies any obvious fluctuation of symptoms with weather or environmental changes or other aggravating or alleviating factors except as outlined above   Current Medications, Allergies, Complete Past Medical History, Past Surgical History, Family History, and Social History were reviewed in [employer] record.   .          Review of Systems  Constitutional: Negative for fever and unexpected weight change.  HENT: Positive for congestion, sneezing, trouble swallowing and postnasal drip. Negative for ear pain, nosebleeds, sore throat, rhinorrhea, dental problem and sinus pressure.   Eyes: Negative for redness and itching.  Respiratory: Positive for cough and shortness of breath. Negative for chest tightness and wheezing.   Cardiovascular: Negative for palpitations and leg swelling.  Gastrointestinal: Negative for nausea and vomiting.  Genitourinary: Negative for  dysuria.  Musculoskeletal: Negative for joint swelling.  Skin: Negative for rash.  Neurological: Positive for dizziness. Negative for headaches.  Hematological: Does not bruise/bleed easily.  Psychiatric/Behavioral: Negative for dysphoric mood. The patient is not nervous/anxious.        Objective:   Physical Exam    Wt Readings from Last 3 Encounters:  10/31/12 154 lb 12.8 oz (70.217 kg)  10/30/12 153 lb 6.4 oz (69.582 kg)  10/23/12 153 lb 8 oz (69.627 kg)    Pleasant amb talkative wf who prefers to answer all questions with medical terminology  HEENT: nl dentition, turbinates, and orophanx. Nl external ear canals without cough reflex   NECK :  without JVD/Nodes/TM/ nl carotid upstrokes bilaterally   LUNGS: no acc muscle use, clear to A and P bilaterally without cough on insp or exp maneuvers   CV:  RRR  no s3 or murmur or increase in P2, no edema   ABD:  soft and nontender with nl excursion in the supine position. No bruits or organomegaly, bowel sounds nl  MS:  warm without deformities, calf tenderness, cyanosis or clubbing  SKIN: warm and dry without lesions    NEURO:  alert, approp, no deficits    cxr 10/19/12 reviewed  Small bilateral pleural effusions no worse than baseline since tubes out       Assessment & Plan:

## 2012-11-01 NOTE — Assessment & Plan Note (Signed)
Most likely this is  Classic Upper airway cough syndrome, so named because it's frequently impossible to sort out how much is  CR/sinusitis with freq throat clearing (which can be related to primary GERD)   vs  causing  secondary (" extra esophageal")  GERD from wide swings in gastric pressure that occur with throat clearing, often  promoting self use of mint and menthol lozenges that reduce the lower esophageal sphincter tone and exacerbate the problem further in a cyclical fashion.   These are the same pts (now being labeled as having "irritable larynx syndrome" by some cough centers) who not infrequently have a history of having failed to tolerate ace inhibitors,  dry powder inhalers or biphosphonates or report having atypical reflux symptoms that don't respond to standard doses of PPI , and are easily confused as having aecopd or asthma flares by even experienced allergists/ pulmonologists.   For now needs max gerd rx and w/u for sinus dz with ct and go from there

## 2012-11-01 NOTE — Assessment & Plan Note (Addendum)
-   10/31/2012  Walked RA x 3 laps @ 185 ft each stopped due to  End of study, no sob, no tachycardia nor desats  Not able to reproduce her "constant x when in recliner" symptoms which are markedly disproportionate to objective findings and not clear this is a lung problem but pt does appear to have difficult airway management issues.   DDX of  difficult airways managment all start with A and  include Adherence, Ace Inhibitors, Acid Reflux, Active Sinus Disease, Alpha 1 Antitripsin deficiency, Anxiety masquerading as Airways dz,  ABPA,  allergy(esp in young), Aspiration (esp in elderly), Adverse effects of DPI,  Active smokers, plus two Bs  = Bronchiectasis and Beta blocker use..and one C= CHF  ? Active sinus dz > see cough  ? Anxiety > dx of exclusion  ? CHF > excluded by Dr Melburn Popper

## 2012-11-01 NOTE — Assessment & Plan Note (Signed)
These are minimal, unchanged and unlikely to be causing any symptoms at all > conservative f/u

## 2012-11-06 ENCOUNTER — Other Ambulatory Visit (HOSPITAL_BASED_OUTPATIENT_CLINIC_OR_DEPARTMENT_OTHER): Payer: Medicare Other | Admitting: Lab

## 2012-11-06 ENCOUNTER — Telehealth: Payer: Self-pay | Admitting: *Deleted

## 2012-11-06 ENCOUNTER — Ambulatory Visit (HOSPITAL_BASED_OUTPATIENT_CLINIC_OR_DEPARTMENT_OTHER): Payer: Medicare Other

## 2012-11-06 ENCOUNTER — Ambulatory Visit (HOSPITAL_COMMUNITY)
Admission: RE | Admit: 2012-11-06 | Discharge: 2012-11-06 | Disposition: A | Discharge status: Home / Self Care | Payer: Medicare Other | Source: Ambulatory Visit | Attending: Oncology | Admitting: Oncology

## 2012-11-06 ENCOUNTER — Encounter: Payer: Self-pay | Admitting: Adult Health

## 2012-11-06 ENCOUNTER — Ambulatory Visit (HOSPITAL_BASED_OUTPATIENT_CLINIC_OR_DEPARTMENT_OTHER): Payer: Medicare Other | Admitting: Adult Health

## 2012-11-06 VITALS — BP 113/70 | HR 93 | Temp 97.4°F | Resp 20 | Ht 66.0 in | Wt 154.2 lb

## 2012-11-06 DIAGNOSIS — C786 Secondary malignant neoplasm of retroperitoneum and peritoneum: Secondary | ICD-10-CM

## 2012-11-06 DIAGNOSIS — Z452 Encounter for adjustment and management of vascular access device: Secondary | ICD-10-CM

## 2012-11-06 DIAGNOSIS — D649 Anemia, unspecified: Secondary | ICD-10-CM

## 2012-11-06 DIAGNOSIS — D0590 Unspecified type of carcinoma in situ of unspecified breast: Secondary | ICD-10-CM

## 2012-11-06 DIAGNOSIS — R0602 Shortness of breath: Secondary | ICD-10-CM

## 2012-11-06 DIAGNOSIS — C569 Malignant neoplasm of unspecified ovary: Secondary | ICD-10-CM

## 2012-11-06 DIAGNOSIS — F341 Dysthymic disorder: Secondary | ICD-10-CM

## 2012-11-06 DIAGNOSIS — Z5111 Encounter for antineoplastic chemotherapy: Secondary | ICD-10-CM

## 2012-11-06 LAB — COMPREHENSIVE METABOLIC PANEL (CC13)
ALT: 20 U/L (ref 0–55)
AST: 17 U/L (ref 5–34)
Albumin: 3 g/dL — ABNORMAL LOW (ref 3.5–5.0)
BUN: 18.1 mg/dL (ref 7.0–26.0)
Creatinine: 0.7 mg/dL (ref 0.6–1.1)
Sodium: 139 mEq/L (ref 136–145)
Total Bilirubin: 0.27 mg/dL (ref 0.20–1.20)

## 2012-11-06 LAB — CBC WITH DIFFERENTIAL/PLATELET
BASO%: 0.6 % (ref 0.0–2.0)
LYMPH%: 16.6 % (ref 14.0–49.7)
MCH: 30.1 pg (ref 25.1–34.0)
MCHC: 31.3 g/dL — ABNORMAL LOW (ref 31.5–36.0)
MCV: 96.3 fL (ref 79.5–101.0)
MONO#: 0.3 10*3/uL (ref 0.1–0.9)
MONO%: 6 % (ref 0.0–14.0)
NEUT#: 3.6 10*3/uL (ref 1.5–6.5)
NEUT%: 75.8 % (ref 38.4–76.8)
Platelets: 134 10*3/uL — ABNORMAL LOW (ref 145–400)
RBC: 2.99 10*6/uL — ABNORMAL LOW (ref 3.70–5.45)
RDW: 21.7 % — ABNORMAL HIGH (ref 11.2–14.5)
WBC: 4.8 10*3/uL (ref 3.9–10.3)
nRBC: 5 % — ABNORMAL HIGH (ref 0–0)

## 2012-11-06 LAB — TECHNOLOGIST REVIEW

## 2012-11-06 MED ORDER — DEXAMETHASONE SODIUM PHOSPHATE 10 MG/ML IJ SOLN
10.0000 mg | Freq: Once | INTRAMUSCULAR | Status: AC
  Administered 2012-11-06: 10 mg via INTRAVENOUS

## 2012-11-06 MED ORDER — ONDANSETRON 8 MG/NS 50 ML IVPB
INTRAVENOUS | Status: AC
  Filled 2012-11-06: qty 8

## 2012-11-06 MED ORDER — ONDANSETRON 8 MG/50ML IVPB (CHCC)
8.0000 mg | Freq: Once | INTRAVENOUS | Status: AC
  Administered 2012-11-06: 8 mg via INTRAVENOUS

## 2012-11-06 MED ORDER — DEXAMETHASONE SODIUM PHOSPHATE 10 MG/ML IJ SOLN
INTRAMUSCULAR | Status: AC
  Filled 2012-11-06: qty 1

## 2012-11-06 MED ORDER — ALTEPLASE 2 MG IJ SOLR
2.0000 mg | Freq: Once | INTRAMUSCULAR | Status: AC | PRN
  Administered 2012-11-06: 2 mg
  Filled 2012-11-06: qty 2

## 2012-11-06 MED ORDER — SODIUM CHLORIDE 0.9 % IJ SOLN
10.0000 mL | INTRAMUSCULAR | Status: DC | PRN
  Administered 2012-11-06: 10 mL
  Filled 2012-11-06: qty 10

## 2012-11-06 MED ORDER — TOPOTECAN HCL CHEMO INJECTION 4 MG
4.0000 mg/m2 | Freq: Once | INTRAVENOUS | Status: AC
  Administered 2012-11-06: 7 mg via INTRAVENOUS
  Filled 2012-11-06: qty 7

## 2012-11-06 MED ORDER — SODIUM CHLORIDE 0.9 % IV SOLN
Freq: Once | INTRAVENOUS | Status: AC
  Administered 2012-11-06: 12:00:00 via INTRAVENOUS

## 2012-11-06 MED ORDER — HEPARIN SOD (PORK) LOCK FLUSH 100 UNIT/ML IV SOLN
500.0000 [IU] | Freq: Once | INTRAVENOUS | Status: AC | PRN
  Administered 2012-11-06: 500 [IU]
  Filled 2012-11-06: qty 5

## 2012-11-06 NOTE — Telephone Encounter (Signed)
Per staff message and POF I have scheduled appts.  [name] [name]  

## 2012-11-06 NOTE — Patient Instructions (Addendum)
Doing well.  Proceed with chemotherapy.  Please call us if you have any questions or concerns.    Blood Products Information This is information about transfusions of blood products. All blood that is to be transfused is tested for blood type, compatibility with the recipient, and for infections. Except in emergencies, giving a transfusion requires a written consent. Blood transfusions are often given as packed red blood cells. This means the other parts of the blood have been taken out. Blood may be needed to treat severe anemia or bleeding. Other blood products include plasma, platelets, immune globulin, and cryoprecipitate. Blood for transfusion is mostly donated by volunteers. The blood donors are carefully screened for risk factors that could cause disease. Donors are all tested for infections that could be transmitted by blood. The blood product supply today is the safest it has ever been. Some risks do remain.  A minor reaction with fever, chills, or rash happens in about 1% of blood product transfusions.  Life-threatening reactions occur in less than 1 in a million transfusions.  Infection with germs (bacteria), viruses or parasites like malaria can still happen. The risk is very low.  Hepatitis B occurs in about 1 case in 150,000 transfusions.  Hepatitis C is seen once in 500,000.  HIV is transmitted less than once every million transfusions. When you receive a transfusion of packed red blood cells, your blood is tested for blood group and Rh type. Your blood is also screened for antibodies that could cause a serious reaction. A cross-match test is done to make sure the blood is safe to give.  Talk with your caregiver if you have any concerns about receiving a transfusion of blood products. Make sure your questions are answered. Transfusions are not given if your caregiver feels the risk is greater than the need. Document Released: 02/01/2005 Document Revised: 04/26/2011 Document  Reviewed: 07/22/2006 [facility] Patient Information 2014 [place], [place]. Blood Transfusion Information WHAT IS A BLOOD TRANSFUSION? A transfusion is the replacement of blood or some of its parts. Blood is made up of multiple cells which provide different functions.  Red blood cells carry oxygen and are used for blood loss replacement.  White blood cells fight against infection.  Platelets control bleeding.  Plasma helps clot blood.  Other blood products are available for specialized needs, such as hemophilia or other clotting disorders. BEFORE THE TRANSFUSION  Who gives blood for transfusions?   You may be able to donate blood to be used at a later date on yourself (autologous donation).  Relatives can be asked to donate blood. This is generally not any safer than if you have received blood from a stranger. The same precautions are taken to ensure safety when a relative's blood is donated.  Healthy volunteers who are fully evaluated to make sure their blood is safe. This is blood bank blood. Transfusion therapy is the safest it has ever been in the practice of medicine. Before blood is taken from a donor, a complete history is taken to make sure that person has no history of diseases nor engages in risky social behavior (examples are intravenous drug use or sexual activity with multiple partners). The donor's travel history is screened to minimize risk of transmitting infections, such as malaria. The donated blood is tested for signs of infectious diseases, such as HIV and hepatitis. The blood is then tested to be sure it is compatible with you in order to minimize the chance of a transfusion reaction. If you or a relative  donates blood, this is often done in anticipation of surgery and is not appropriate for emergency situations. It takes many days to process the donated blood. RISKS AND COMPLICATIONS Although transfusion therapy is very safe and saves many lives, the main dangers of  transfusion include:   Getting an infectious disease.  Developing a transfusion reaction. This is an allergic reaction to something in the blood you were given. Every precaution is taken to prevent this. The decision to have a blood transfusion has been considered carefully by your caregiver before blood is given. Blood is not given unless the benefits outweigh the risks. AFTER THE TRANSFUSION  Right after receiving a blood transfusion, you will usually feel much better and more energetic. This is especially true if your red blood cells have gotten low (anemic). The transfusion raises the level of the red blood cells which carry oxygen, and this usually causes an energy increase.  The nurse administering the transfusion will monitor you carefully for complications. HOME CARE INSTRUCTIONS  No special instructions are needed after a transfusion. You may find your energy is better. Speak with your caregiver about any limitations on activity for underlying diseases you may have. SEEK MEDICAL CARE IF:   Your condition is not improving after your transfusion.  You develop redness or irritation at the intravenous (IV) site. SEEK IMMEDIATE MEDICAL CARE IF:  Any of the following symptoms occur over the next 12 hours:  Shaking chills.  You have a temperature by mouth above 102 F (38.9 C), not controlled by medicine.  Chest, back, or muscle pain.  People around you feel you are not acting correctly or are confused.  Shortness of breath or difficulty breathing.  Dizziness and fainting.  You get a rash or develop hives.  You have a decrease in urine output.  Your urine turns a dark color or changes to pink, red, or brown. Any of the following symptoms occur over the next 10 days:  You have a temperature by mouth above 102 F (38.9 C), not controlled by medicine.  Shortness of breath.  Weakness after normal activity.  The white part of the eye turns yellow (jaundice).  You have a  decrease in the amount of urine or are urinating less often.  Your urine turns a dark color or changes to pink, red, or brown. Document Released: 01/30/2000 Document Revised: 04/26/2011 Document Reviewed: 09/18/2007 [facility] Patient Information 2014 [place], [place].

## 2012-11-06 NOTE — Progress Notes (Addendum)
[facility]  Telephone:([phone]) [phone] Fax:([phone]) [phone]  OFFICE PROGRESS NOTE   PCP: Elesa Massed, M.D. SU: Emelia Loron, M.D. RAD ONC: Antony Blackbird, M.D.   DIAGNOSIS: [name] [name] is a 67 year-old female with a history of ductal carcinoma in situ of the left breast diagnosed 04/2009 and ovarian cancer diagnosed in 12/2011.   PRIOR THERAPY: #1. 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 Patient had 3 sentinel biopsied all of them were negative for metastatic disease. Patient underwent radiation therapy between 06/05/2009 through 07/02/2009. She was then begun on Tamoxifen 20 mg daily in 09/2009.  #2  In 12/2011 she received the diagnosis of gynecologic malignancy presenting with abdominal mass and pleural effusions. Patient was originally hospitalized with shortness of breath in 12/2011.  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 She also had malignant ascites.  She underwent bilateral thoracentesis and paracentesis procedures during her hospitalization. During her hospitalization she was seen by gynecologic oncology.  #3 Patient began neoadjuvant chemotherapy consisting of Taxol and Carboplatinum. Her first cycle was administered during her hospitalization. She has completed a total of 6 cycles of Taxol/Carboplatinum from 01/07/2012 - 06/23/2012. Overall she tolerated it well except for some fatigue and neuropathies.  #4 Patient is status post laparotomy in 05/2012 that revealed significant residual disease.   #5  Patient began adjuvant therapy with Topotecan/Avastin beginning on 07/24/2012.   CURRENT THERAPY: Topotecan/Avastin   INTERVAL HISTORY:  BreannaAracelys M. [name] Sages 67 y.o. female returns today for followup of ovarian cancer.   She is doing well today.  She does endorse shortness of breath, it is unchanged from  last week, and worse with exertion. She saw Dr. Sherene Sires as a pulmonology evaluation last week.  He conveyed that her shortness of breath was likely related to reflux.  She takes Omeprazole in the morning and evening.  He had her switch to Omeprazole in the morning and pepto bismol in the evening.  [name] also eliminated chocolate, orange juice, colas, from her diet.  She doesn't eat spicy or fast foods.  She has not noticed a difference as of yet in her shortness of breath.  She does have a sinus CT arranged for tomorrow and PFTs arranged after her return from [place].  She will then see him again.  She denies fevers, chills, nausea, vomiting, constipation, diarrhea, numbness or further concerns.     MEDICAL HISTORY: Past Medical History  Diagnosis Date  . Interstitial cystitis     on chronic antibiotics  . Hyperlipidemia   . Frequent UTI     on prophylaxis  . 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 CAD (coronary artery disease)     a. s/p CABG 7/13;   b.  LHC 12/01/11:  pLAD 70%, mLAD 40%, CFX 40-50% prior to takeoff of the OM2, oRCA occluded, mid vessel filled via R->R collaterals and distal vessel filled by L->R collaterals, S-OM1/OM2 (small and diffusely dz) with mid 90% stenosis, 90% at OM1 anastomotic site, continuation of OM2 occluded, S-Dx patent, S-PDA occluded, L-LAD ok, EF 55-65%  => Med Rx rec.  [name] Hx of echocardiogram     Echo 5/13: EF 60-65%, grade 2 diastolic dysfunction  . Hypercholesterolemia   . Pleural effusion 12/28/11    s/p pleurx catheter  . GERD (gastroesophageal reflux disease)   . Arthritis     "just a little; lower  back" (12/28/11)  . Gout attack 08/2011    related to "stress post OHS"  . Depression   . Breast cancer     "left"; on Tamoxifen  . S/P thoracentesis 12/28/11    "for pleural effusion" (12/28/2011)  . Ovarian cancer   . Tachycardia   . Neuromuscular disorder     peripheral neuropathy    ALLERGIES:  is allergic to codeine; isosorbide; other; phenothiazines;  talwin; yellow jacket venom; ambien; and tegaderm ag mesh.  MEDICATIONS:  Current Outpatient Prescriptions  Medication Sig Dispense Refill  . ALPRAZolam (XANAX) 0.25 MG tablet Take 1 tablet (0.25 mg total) by mouth every 8 (eight) hours as needed for anxiety.  30 tablet  4  . Alum & Mag Hydroxide-Simeth (MAGIC MOUTHWASH) SOLN Take 5 mLs by mouth 4 (four) times daily.       [name] aspirin 325 MG tablet Take 325 mg by mouth at bedtime.       [name] atorvastatin (LIPITOR) 20 MG tablet TAKE ONE TABLET BY MOUTH ONCE DAILY  90 tablet  3  . bismuth subsalicylate (PEPTO BISMOL) 262 MG/15ML suspension Take 15 mLs by mouth every 6 (six) hours as needed for indigestion.      . carvedilol (COREG) 25 MG tablet Take 1 tablet (25 mg total) by mouth 2 (two) times daily.  180 tablet  3  . dexamethasone (DECADRON) 4 MG tablet Take 2 tablets (8 mg total) by mouth 2 (two) times daily with a meal. Take daily starting the day after chemotherapy for 2 days. Take with food.  30 tablet  1  . docusate sodium (COLACE) 100 MG capsule Take 200 mg by mouth daily.      . fluticasone (FLONASE) 50 MCG/ACT nasal spray Place 2 sprays into the nose daily as needed for allergies.      [name] gabapentin (NEURONTIN) 100 MG capsule Take 100 mg orally three times a day  90 capsule  6  . HYDROcodone-acetaminophen (NORCO/VICODIN) 5-325 MG per tablet Take 1-2 tablets by mouth every 4 (four) hours as needed.  40 tablet  2  . lidocaine-prilocaine (EMLA) cream Apply topically as needed.  30 g  1  . loratadine (CLARITIN) 10 MG tablet Take 10 mg by mouth daily.      . nitroGLYCERIN (NITROSTAT) 0.4 MG SL tablet Place 0.4 mg under the tongue every 5 (five) minutes as needed. Chest pain      . omeprazole (PRILOSEC) 20 MG capsule Take 1 capsule (20 mg total) by mouth 2 (two) times daily.  60 capsule  12  . ondansetron (ZOFRAN) 8 MG tablet Take 1 tablet (8 mg total) by mouth 2 (two) times daily. Take two times a day starting the day after chemo for 2 days. Then  take two times a day as needed for nausea or vomiting.  30 tablet  1  . oxybutynin (DITROPAN-XL) 10 MG 24 hr tablet Take 10 mg by mouth daily.      Bertram Gala Glycol-Propyl Glycol 0.4-0.3 % SOLN Place 1 drop into both eyes every morning.       . prochlorperazine (COMPAZINE) 25 MG suppository Place 1 suppository (25 mg total) rectally every 12 (twelve) hours as needed for nausea.  12 suppository  3  . senna (SENOKOT) 8.6 MG tablet Take 1 tablet by mouth daily as needed for constipation.      . SF 5000 PLUS 1.1 % CREA dental cream Place 1.1 % onto teeth at bedtime.       [name]  trimethoprim (TRIMPEX) 100 MG tablet Take 100 mg by mouth every morning.       . valACYclovir (VALTREX) 500 MG tablet Take 1 tablet (500 mg total) by mouth daily.  30 tablet  7  . venlafaxine XR (EFFEXOR-XR) 75 MG 24 hr capsule Take 75 mg  Orally daily  30 capsule  8  . EPINEPHrine (EPIPEN 2-PAK) 0.3 mg/0.3 mL DEVI Inject 0.3 mg into the muscle daily as needed (allergic reaction).        [name] [name] current facility-administered medications for this visit.   Facility-Administered Medications Ordered in Other Visits  Medication Dose Route Frequency Provider Last Rate Last Dose  . influenza  inactive virus vaccine (FLUZONE/FLUARIX) injection 0.5 mL  0.5 mL Intramuscular Once Joselyn Arrow, MD        SURGICAL HISTORY:  Past Surgical History  Procedure Laterality Date  . Breast lumpectomy  04/2009    left  . Cesarean section  1973; 1976  . Muscle release  1960    L neck and chest.; "when I was 13; pneumonia settled in my left neck"  . Coronary artery bypass graft  08/18/2011    Procedure: CORONARY ARTERY BYPASS GRAFTING (CABG);  Surgeon: Alleen Borne, MD;  Location: [facility] OR;  Service: Open Heart Surgery;  Laterality: N/A;  Coronary Artery Bypass Graft times five utilizing the left internal mammary artery and the left greater saphenous vein harvested endoscopically.  . Abdominal hysterectomy  1976  . Appendectomy  1976  . Portacath  placement  01/06/2012    Procedure: INSERTION PORT-A-CATH;  Surgeon: Alleen Borne, MD;  Location: [facility] OR;  Service: Thoracic;  Laterality: Left;  . Chest tube insertion  01/06/2012    Procedure: INSERTION PLEURAL DRAINAGE CATHETER;  Surgeon: Alleen Borne, MD;  Location: [facility] OR;  Service: Thoracic;  Laterality: Bilateral;  . Removal of pleural drainage catheter Right 04/12/2012    Procedure: MINOR REMOVAL OF PLEURAL DRAINAGE CATHETER;  Surgeon: Alleen Borne, MD;  Location: [facility] OR;  Service: Thoracic;  Laterality: Right;  . Talc pleurodesis Left 04/12/2012    Procedure: Lurlean Nanny;  Surgeon: Alleen Borne, MD;  Location: [facility] OR;  Service: Thoracic;  Laterality: Left;  . Portacath placement Left 04/17/2012    Procedure: INSERTION PORT-A-CATH;  Surgeon: Alleen Borne, MD;  Location: [facility] OR;  Service: Thoracic;  Laterality: Left;  . Removal of pleural drainage catheter Left 04/17/2012    Procedure: REMOVAL OF PLEURAL DRAINAGE CATHETER;  Surgeon: Alleen Borne, MD;  Location: [facility] OR;  Service: Thoracic;  Laterality: Left;  . Port-a-cath removal Left 04/17/2012    Procedure: REMOVAL PORT-A-CATH;  Surgeon: Alleen Borne, MD;  Location: [facility] OR;  Service: Thoracic;  Laterality: Left;  . Cardiac catheterization    . Laparotomy Bilateral 05/30/2012    Procedure: Resection of umbilical mass, Partial omentectomy;  Surgeon: Jeannette Corpus, MD;  Location: [facility] ORS;  Service: Gynecology;  Laterality: Bilateral;    REVIEW OF SYSTEMS:   A 10 point review of systems was completed and is negative except as noted above.  PHYSICAL EXAMINATION:  BP 113/70  Pulse 93  Temp(Src) 97.4 F (36.3 C) (Oral)  Resp 20  Ht 5\' 6"  (1.676 m)  Wt 154 lb 3.2 oz (69.945 kg)  BMI 24.9 kg/m2  General: Patient is a well appearing female in [name] [name] acute distress HEENT: PERRLA, sclerae anicteric, conjunctival pallor, MMM Neck: supple, [name] [name] palpable adenopathy Lungs: clear to auscultation bilaterally, [name] [name] wheezes, rhonchi, or  rales Cardiovascular:  regular rate rhythm, S1, S2, [name] [name] murmurs, rubs or gallops Abdomen: Soft, non-tender, non-distended, normoactive bowel sounds, [name] [name] HSM Extremities: warm and well perfused, [name] [name] clubbing, cyanosis, or edema Skin: [name] [name] rashes or lesions, pale Neuro: Non-focal  ECOG PERFORMANCE STATUS: 1 - Symptomatic but completely ambulatory.   LABORATORY DATA: Lab Results  Component Value Date   WBC 4.8 11/06/2012   HGB 9.0* 11/06/2012   HCT 28.8* 11/06/2012   MCV 96.3 11/06/2012   PLT 134* 11/06/2012      Chemistry      Component Value Date/Time   NA 139 11/06/2012 0833   NA 134* 06/02/2012 0515   NA 141 12/17/2011 1144   K 4.0 11/06/2012 0833   K 4.4 06/02/2012 0515   CL 103 08/07/2012 1108   CL 100 06/02/2012 0515   CO2 23 11/06/2012 0833   CO2 25 06/02/2012 0515   BUN 18.1 11/06/2012 0833   BUN 20 06/02/2012 0515   BUN 15 12/17/2011 1144   CREATININE 0.7 11/06/2012 0833   CREATININE 0.76 06/02/2012 0515      Component Value Date/Time   CALCIUM 9.2 11/06/2012 0833   CALCIUM 8.7 06/02/2012 0515   ALKPHOS 80 11/06/2012 0833   ALKPHOS 85 05/26/2012 0955   AST 17 11/06/2012 0833   AST 22 05/26/2012 0955   ALT 20 11/06/2012 0833   ALT 16 05/26/2012 0955   BILITOT 0.27 11/06/2012 0833   BILITOT 0.3 05/26/2012 0955       RADIOGRAPHIC STUDIES: Dg Chest 2 View 08/30/2012   *RADIOLOGY REPORT*  Clinical Data: Shortness of breath, history metastatic ovarian carcinoma and history of bilateral pleural effusions.  CHEST - 2 VIEW  Comparison: 05/26/2012  Findings: There is significant diminishment in bilateral pleural effusions since the prior study with only trace amount of pleural fluid present bilaterally.  The lungs show [name] [name] evidence of focal consolidation, masses or pulmonary edema.  Port-A-Cath positioning stable in the SVC.  The heart size is normal.  [name] [name] bony lesions are seen.  IMPRESSION: Significant diminishment in bilateral pleural effusions since the prior chest x-ray. Trace bilateral pleural  effusions remain.  [name] [name] acute findings.   Original Report Authenticated By: Irish Lack, M.D.     ASSESSMENT: 67 year old woman:   #1 History of DCIS of the left breast status post lumpectomy in 04/2009.  Status post radiation therapy completed in 06/2009.  Was on antiestrogen therapy with Tamoxifen 20 mg by mouth daily that was started in 09/2009.  Tamoxifen has been discontinued for now due to her recent diagnosis of ovarian cancer.  #2 Gynecologic malignancy (ovarian carcinoma) diagnosed in 12/2011. Patient received 6 cycles of neoadjuvant chemotherapy consisting of Taxol/Carboplatinum completed in 06/2012.  #3 She is now status post laparotomy performed on 05/30/2012. Unfortunately, intraoperatively she was found to have significant residual disease. 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   #4 Patient completed chemotherapy sensitivity testing. Unfortunately the results were inconclusive.  It was decided to proceed with chemotherapy consisting of Topotecan and Avastin.  Dr. Grant Ruts also recommended this treatment regimen.  #5 Patient was started chemotherapy consisting of Topotecan and Avastin  With Topotecan q week on day 1, 8,15 on a 28 day cycle, and Avastin scheduled for every 3 weeks.  #6 Depression/anxiety: Currently on Effexor with good response  #7 Shortness of breath: patient has been seen by cardilogist Dr. Elease Hashimoto and cardiac etiology was ruled out.  She will f/u with  Dr. Sherene Sires on 11/23/12, he has already begun her evaluation.    #8 History of dehydration -patient instructed to drink plenty of water  #9  Cold sores/herpes labialis.  #10 Neuropathy - currently on Gabapentin 100 mg by mouth 3 times a day.  Neuropathy is currently resolved with this.     PLAN: #1  Doing well.  Labs are stable, I reviewed them with her in detail.  Patient will proceed with treatment today with Topotecan.    She will return on Wednesday for two units of PRBCs for a hemoglobin of 9, and symptomatic anemia.    #2 She will continue Valtrex for her mouth and magic mouthwash as needed.   #3 She will return on 11/20/12 for labs, evaluation and treatment   #4 She undergo sinus CT and PFTs as ordered by Dr. Sherene Sires and return to see him on 11/23/12.  In review of Dr. Rolin Barry note, he recommended Pepcid to the patient, not pepto bismol, I informed her of this, and she will start taking tonight.    All questions were answered.  Patient was encouraged to contact us with any questions, problems or concerns.   I spent 25 minutes counseling the patient face to face.  The total time spent in the appointment was 30 minutes.  Cherie Ouch Lyn Hollingshead, NP Medical Oncology [facility] Phone: [phone] 11/06/2012  9:53 PM    ATTENDING'S ATTESTATION:  I personally reviewed patient's chart, examined patient myself, formulated the treatment plan as followed.    Overall patient is doing well. Her chemotherapy is gong to be given on 11/20/2012 as patient is leaving on a trip. She does have some mouth sores and we will continue Valtrex as well as Magic mouthwash. This has been helping her. Patient also has been seen by pulmonary for ongoing shortness of breath. She is seeing Dr. Sherene Sires will followup on those scans and the PFTs that he is recommending.  Drue Second, MD Medical/Oncology [facility] [phone] (beeper) [phone] (Office)

## 2012-11-06 NOTE — Patient Instructions (Addendum)
[facility] Discharge Instructions for Patients Receiving Chemotherapy  Today you received the following chemotherapy agents:  Topotecan  To help prevent nausea and vomiting after your treatment, we encourage you to take your nausea medication as ordered per MD.   If you develop nausea and vomiting that is not controlled by your nausea medication, call the clinic.   BELOW ARE SYMPTOMS THAT SHOULD BE REPORTED IMMEDIATELY:  *FEVER GREATER THAN 100.5 F  *CHILLS WITH OR WITHOUT FEVER  NAUSEA AND VOMITING THAT IS NOT CONTROLLED WITH YOUR NAUSEA MEDICATION  *UNUSUAL SHORTNESS OF BREATH  *UNUSUAL BRUISING OR BLEEDING  TENDERNESS IN MOUTH AND THROAT WITH OR WITHOUT PRESENCE OF ULCERS  *URINARY PROBLEMS  *BOWEL PROBLEMS  UNUSUAL RASH Items with * indicate a potential emergency and should be followed up as soon as possible.  Feel free to call the clinic you have any questions or concerns. The clinic phone number is [phone].    

## 2012-11-06 NOTE — Progress Notes (Signed)
1042-TPA inserted to PAC.  1130-Positive blood return noted via PAC.

## 2012-11-07 ENCOUNTER — Ambulatory Visit (INDEPENDENT_AMBULATORY_CARE_PROVIDER_SITE_OTHER)
Admission: RE | Admit: 2012-11-07 | Discharge: 2012-11-07 | Disposition: A | Discharge status: Home / Self Care | Payer: Medicare Other | Source: Ambulatory Visit | Attending: Internal Medicine | Admitting: Internal Medicine

## 2012-11-07 ENCOUNTER — Encounter: Payer: Self-pay | Admitting: Internal Medicine

## 2012-11-07 DIAGNOSIS — R0600 Dyspnea, unspecified: Secondary | ICD-10-CM

## 2012-11-07 DIAGNOSIS — R0609 Other forms of dyspnea: Secondary | ICD-10-CM

## 2012-11-08 ENCOUNTER — Ambulatory Visit (HOSPITAL_BASED_OUTPATIENT_CLINIC_OR_DEPARTMENT_OTHER): Payer: Medicare Other

## 2012-11-08 VITALS — BP 154/82 | HR 66 | Temp 98.4°F | Resp 19 | Ht 66.0 in

## 2012-11-08 DIAGNOSIS — C569 Malignant neoplasm of unspecified ovary: Secondary | ICD-10-CM

## 2012-11-08 LAB — PREPARE RBC (CROSSMATCH)

## 2012-11-08 MED ORDER — ACETAMINOPHEN 325 MG PO TABS
ORAL_TABLET | ORAL | Status: AC
  Filled 2012-11-08: qty 2

## 2012-11-08 MED ORDER — DIPHENHYDRAMINE HCL 25 MG PO CAPS
ORAL_CAPSULE | ORAL | Status: AC
  Filled 2012-11-08: qty 1

## 2012-11-08 MED ORDER — HEPARIN SOD (PORK) LOCK FLUSH 100 UNIT/ML IV SOLN
500.0000 [IU] | Freq: Once | INTRAVENOUS | Status: AC
  Administered 2012-11-08: 500 [IU] via INTRAVENOUS
  Filled 2012-11-08: qty 5

## 2012-11-08 MED ORDER — DIPHENHYDRAMINE HCL 25 MG PO CAPS
25.0000 mg | ORAL_CAPSULE | Freq: Once | ORAL | Status: AC
  Administered 2012-11-08: 25 mg via ORAL

## 2012-11-08 MED ORDER — SODIUM CHLORIDE 0.9 % IV SOLN
INTRAVENOUS | Status: DC
  Administered 2012-11-08: 09:00:00 via INTRAVENOUS

## 2012-11-08 MED ORDER — SODIUM CHLORIDE 0.9 % IJ SOLN
10.0000 mL | INTRAMUSCULAR | Status: DC | PRN
  Administered 2012-11-08: 10 mL via INTRAVENOUS
  Filled 2012-11-08: qty 10

## 2012-11-08 MED ORDER — ACETAMINOPHEN 325 MG PO TABS
650.0000 mg | ORAL_TABLET | Freq: Once | ORAL | Status: AC
  Administered 2012-11-08: 650 mg via ORAL

## 2012-11-08 NOTE — Patient Instructions (Addendum)
Blood Transfusion Information WHAT IS A BLOOD TRANSFUSION? A transfusion is the replacement of blood or some of its parts. Blood is made up of multiple cells which provide different functions.  Red blood cells carry oxygen and are used for blood loss replacement.  White blood cells fight against infection.  Platelets control bleeding.  Plasma helps clot blood.  Other blood products are available for specialized needs, such as hemophilia or other clotting disorders. BEFORE THE TRANSFUSION  Who gives blood for transfusions?   You may be able to donate blood to be used at a later date on yourself (autologous donation).  Relatives can be asked to donate blood. This is generally not any safer than if you have received blood from a stranger. The same precautions are taken to ensure safety when a relative's blood is donated.  Healthy volunteers who are fully evaluated to make sure their blood is safe. This is blood bank blood. Transfusion therapy is the safest it has ever been in the practice of medicine. Before blood is taken from a donor, a complete history is taken to make sure that person has no history of diseases nor engages in risky social behavior (examples are intravenous drug use or sexual activity with multiple partners). The donor's travel history is screened to minimize risk of transmitting infections, such as malaria. The donated blood is tested for signs of infectious diseases, such as HIV and hepatitis. The blood is then tested to be sure it is compatible with you in order to minimize the chance of a transfusion reaction. If you or a relative donates blood, this is often done in anticipation of surgery and is not appropriate for emergency situations. It takes many days to process the donated blood. RISKS AND COMPLICATIONS Although transfusion therapy is very safe and saves many lives, the main dangers of transfusion include:   Getting an infectious disease.  Developing a  transfusion reaction. This is an allergic reaction to something in the blood you were given. Every precaution is taken to prevent this. The decision to have a blood transfusion has been considered carefully by your caregiver before blood is given. Blood is not given unless the benefits outweigh the risks. AFTER THE TRANSFUSION  Right after receiving a blood transfusion, you will usually feel much better and more energetic. This is especially true if your red blood cells have gotten low (anemic). The transfusion raises the level of the red blood cells which carry oxygen, and this usually causes an energy increase.  The nurse administering the transfusion will monitor you carefully for complications. HOME CARE INSTRUCTIONS  No special instructions are needed after a transfusion. You may find your energy is better. Speak with your caregiver about any limitations on activity for underlying diseases you may have. SEEK MEDICAL CARE IF:   Your condition is not improving after your transfusion.  You develop redness or irritation at the intravenous (IV) site. SEEK IMMEDIATE MEDICAL CARE IF:  Any of the following symptoms occur over the next 12 hours:  Shaking chills.  You have a temperature by mouth above 102 F (38.9 C), not controlled by medicine.  Chest, back, or muscle pain.  People around you feel you are not acting correctly or are confused.  Shortness of breath or difficulty breathing.  Dizziness and fainting.  You get a rash or develop hives.  You have a decrease in urine output.  Your urine turns a dark color or changes to pink, red, or brown. Any of the following   symptoms occur over the next 10 days:  You have a temperature by mouth above 102 F (38.9 C), not controlled by medicine.  Shortness of breath.  Weakness after normal activity.  The white part of the eye turns yellow (jaundice).  You have a decrease in the amount of urine or are urinating less often.  Your  urine turns a dark color or changes to pink, red, or brown. Document Released: 01/30/2000 Document Revised: 04/26/2011 Document Reviewed: 09/18/2007 ExitCare Patient Information 2014 [employer].  

## 2012-11-09 LAB — TYPE AND SCREEN
ABO/RH(D): A POS
Unit division: 0
Unit division: 0

## 2012-11-13 ENCOUNTER — Other Ambulatory Visit: Payer: Medicare Other | Admitting: Lab

## 2012-11-13 ENCOUNTER — Ambulatory Visit: Payer: Medicare Other

## 2012-11-13 ENCOUNTER — Ambulatory Visit: Payer: Medicare Other | Admitting: Adult Health

## 2012-11-20 ENCOUNTER — Encounter (HOSPITAL_COMMUNITY): Payer: Medicare Other

## 2012-11-20 ENCOUNTER — Telehealth: Payer: Self-pay | Admitting: Internal Medicine

## 2012-11-20 ENCOUNTER — Encounter: Payer: Self-pay | Admitting: Oncology

## 2012-11-20 ENCOUNTER — Telehealth: Payer: Self-pay | Admitting: *Deleted

## 2012-11-20 ENCOUNTER — Other Ambulatory Visit (HOSPITAL_BASED_OUTPATIENT_CLINIC_OR_DEPARTMENT_OTHER): Payer: Medicare Other | Admitting: Lab

## 2012-11-20 ENCOUNTER — Ambulatory Visit (HOSPITAL_BASED_OUTPATIENT_CLINIC_OR_DEPARTMENT_OTHER): Payer: Medicare Other | Admitting: Adult Health

## 2012-11-20 ENCOUNTER — Encounter: Payer: Self-pay | Admitting: Adult Health

## 2012-11-20 ENCOUNTER — Ambulatory Visit (HOSPITAL_BASED_OUTPATIENT_CLINIC_OR_DEPARTMENT_OTHER): Payer: Medicare Other

## 2012-11-20 ENCOUNTER — Ambulatory Visit (HOSPITAL_COMMUNITY)
Admission: RE | Admit: 2012-11-20 | Discharge: 2012-11-20 | Disposition: A | Discharge status: Home / Self Care | Payer: Medicare Other | Source: Ambulatory Visit | Attending: Adult Health | Admitting: Adult Health

## 2012-11-20 VITALS — BP 115/70 | HR 81 | Temp 97.5°F | Resp 18 | Ht 66.0 in | Wt 160.5 lb

## 2012-11-20 DIAGNOSIS — Z5112 Encounter for antineoplastic immunotherapy: Secondary | ICD-10-CM

## 2012-11-20 DIAGNOSIS — Z23 Encounter for immunization: Secondary | ICD-10-CM

## 2012-11-20 DIAGNOSIS — J019 Acute sinusitis, unspecified: Secondary | ICD-10-CM

## 2012-11-20 DIAGNOSIS — C569 Malignant neoplasm of unspecified ovary: Secondary | ICD-10-CM

## 2012-11-20 DIAGNOSIS — D0590 Unspecified type of carcinoma in situ of unspecified breast: Secondary | ICD-10-CM

## 2012-11-20 DIAGNOSIS — C786 Secondary malignant neoplasm of retroperitoneum and peritoneum: Secondary | ICD-10-CM

## 2012-11-20 DIAGNOSIS — Z170 Estrogen receptor positive status [ER+]: Secondary | ICD-10-CM

## 2012-11-20 DIAGNOSIS — R221 Localized swelling, mass and lump, neck: Secondary | ICD-10-CM

## 2012-11-20 DIAGNOSIS — M7989 Other specified soft tissue disorders: Secondary | ICD-10-CM

## 2012-11-20 DIAGNOSIS — J069 Acute upper respiratory infection, unspecified: Secondary | ICD-10-CM

## 2012-11-20 DIAGNOSIS — F411 Generalized anxiety disorder: Secondary | ICD-10-CM

## 2012-11-20 DIAGNOSIS — Z5111 Encounter for antineoplastic chemotherapy: Secondary | ICD-10-CM

## 2012-11-20 LAB — COMPREHENSIVE METABOLIC PANEL (CC13)
ALT: 30 U/L (ref 0–55)
Albumin: 3 g/dL — ABNORMAL LOW (ref 3.5–5.0)
Alkaline Phosphatase: 71 U/L (ref 40–150)
BUN: 13.8 mg/dL (ref 7.0–26.0)
CO2: 21 mEq/L — ABNORMAL LOW (ref 22–29)
Calcium: 9 mg/dL (ref 8.4–10.4)
Chloride: 113 mEq/L — ABNORMAL HIGH (ref 98–109)
Creatinine: 0.7 mg/dL (ref 0.6–1.1)
Glucose: 106 mg/dl (ref 70–140)
Sodium: 143 mEq/L (ref 136–145)
Total Protein: 6.3 g/dL — ABNORMAL LOW (ref 6.4–8.3)

## 2012-11-20 LAB — CBC WITH DIFFERENTIAL/PLATELET
BASO%: 0.5 % (ref 0.0–2.0)
Eosinophils Absolute: 0.1 10*3/uL (ref 0.0–0.5)
HCT: 34.8 % (ref 34.8–46.6)
MCHC: 31.3 g/dL — ABNORMAL LOW (ref 31.5–36.0)
MONO#: 0.5 10*3/uL (ref 0.1–0.9)
NEUT#: 2.2 10*3/uL (ref 1.5–6.5)
NEUT%: 58.2 % (ref 38.4–76.8)
Platelets: 363 10*3/uL (ref 145–400)
RBC: 3.7 10*6/uL (ref 3.70–5.45)
WBC: 3.8 10*3/uL — ABNORMAL LOW (ref 3.9–10.3)
lymph#: 0.9 10*3/uL (ref 0.9–3.3)

## 2012-11-20 LAB — UA PROTEIN, DIPSTICK - CHCC: Protein, ur: <30 mg/dL

## 2012-11-20 MED ORDER — HEPARIN SOD (PORK) LOCK FLUSH 100 UNIT/ML IV SOLN
500.0000 [IU] | Freq: Once | INTRAVENOUS | Status: AC | PRN
  Administered 2012-11-20: 500 [IU]
  Filled 2012-11-20: qty 5

## 2012-11-20 MED ORDER — SODIUM CHLORIDE 0.9 % IV SOLN
15.0000 mg/kg | Freq: Once | INTRAVENOUS | Status: AC
  Administered 2012-11-20: 1025 mg via INTRAVENOUS
  Filled 2012-11-20: qty 41

## 2012-11-20 MED ORDER — DEXAMETHASONE SODIUM PHOSPHATE 10 MG/ML IJ SOLN
INTRAMUSCULAR | Status: AC
  Filled 2012-11-20: qty 1

## 2012-11-20 MED ORDER — DEXAMETHASONE SODIUM PHOSPHATE 10 MG/ML IJ SOLN
10.0000 mg | Freq: Once | INTRAMUSCULAR | Status: AC
  Administered 2012-11-20: 10 mg via INTRAVENOUS

## 2012-11-20 MED ORDER — ONDANSETRON 8 MG/NS 50 ML IVPB
INTRAVENOUS | Status: AC
  Filled 2012-11-20: qty 8

## 2012-11-20 MED ORDER — AZITHROMYCIN 250 MG PO TABS: ORAL_TABLET | ORAL | Status: DC

## 2012-11-20 MED ORDER — SODIUM CHLORIDE 0.9 % IJ SOLN
10.0000 mL | INTRAMUSCULAR | Status: DC | PRN
  Administered 2012-11-20: 10 mL
  Filled 2012-11-20: qty 10

## 2012-11-20 MED ORDER — SODIUM CHLORIDE 0.9 % IV SOLN
Freq: Once | INTRAVENOUS | Status: AC
  Administered 2012-11-20: 13:00:00 via INTRAVENOUS

## 2012-11-20 MED ORDER — INFLUENZA VAC SPLIT QUAD 0.5 ML IM SUSP
0.5000 mL | INTRAMUSCULAR | Status: AC
  Administered 2012-11-20: 0.5 mL via INTRAMUSCULAR
  Filled 2012-11-20: qty 0.5

## 2012-11-20 MED ORDER — ONDANSETRON 8 MG/50ML IVPB (CHCC)
8.0000 mg | Freq: Once | INTRAVENOUS | Status: AC
  Administered 2012-11-20: 8 mg via INTRAVENOUS

## 2012-11-20 MED ORDER — TOPOTECAN HCL CHEMO INJECTION 4 MG
4.0000 mg/m2 | Freq: Once | INTRAVENOUS | Status: AC
  Administered 2012-11-20: 7 mg via INTRAVENOUS
  Filled 2012-11-20: qty 7

## 2012-11-20 NOTE — Patient Instructions (Addendum)
[facility] Discharge Instructions for Patients Receiving Chemotherapy  Today you received the following chemotherapy agents Topotecan/Avastin  To help prevent nausea and vomiting after your treatment, we encourage you to take your nausea medication as needed   If you develop nausea and vomiting that is not controlled by your nausea medication, call the clinic.   BELOW ARE SYMPTOMS THAT SHOULD BE REPORTED IMMEDIATELY:  *FEVER GREATER THAN 100.5 F  *CHILLS WITH OR WITHOUT FEVER  NAUSEA AND VOMITING THAT IS NOT CONTROLLED WITH YOUR NAUSEA MEDICATION  *UNUSUAL SHORTNESS OF BREATH  *UNUSUAL BRUISING OR BLEEDING  TENDERNESS IN MOUTH AND THROAT WITH OR WITHOUT PRESENCE OF ULCERS  *URINARY PROBLEMS  *BOWEL PROBLEMS  UNUSUAL RASH Items with * indicate a potential emergency and should be followed up as soon as possible.  Feel free to call the clinic you have any questions or concerns. The clinic phone number is [phone].

## 2012-11-20 NOTE — Patient Instructions (Signed)
Azithromycin tablets What is this medicine? AZITHROMYCIN (az ith roe MYE sin) is a macrolide antibiotic. It is used to treat or prevent certain kinds of bacterial infections. It will not work for colds, flu, or other viral infections. This medicine may be used for other purposes; ask your health care provider or pharmacist if you have questions. What should I tell my health care provider before I take this medicine? They need to know if you have any of these conditions: -kidney disease -liver disease -irregular heartbeat or heart disease -an unusual or allergic reaction to azithromycin, erythromycin, other macrolide antibiotics, foods, dyes, or preservatives -pregnant or trying to get pregnant -breast-feeding How should I use this medicine? Take this medicine by mouth with a full glass of water. Follow the directions on the prescription label. The tablets can be taken with food or on an empty stomach. If the medicine upsets your stomach, take it with food. Take your medicine at regular intervals. Do not take your medicine more often than directed. Take all of your medicine as directed even if you think your are better. Do not skip doses or stop your medicine early. Talk to your pediatrician regarding the use of this medicine in children. Special care may be needed. Overdosage: If you think you have taken too much of this medicine contact a poison control center or emergency room at once. NOTE: This medicine is only for you. Do not share this medicine with others. What if I miss a dose? If you miss a dose, take it as soon as you can. If it is almost time for your next dose, take only that dose. Do not take double or extra doses. What may interact with this medicine? Do not take this medicine with any of the following medications: -lincomycin This medicine may also interact with the following medications: -amiodarone -antacids -cyclosporine -digoxin -dihydroergotamine or  ergotamine -magnesium -nelfinavir -phenytoin -warfarin This list may not describe all possible interactions. Give your health care provider a list of all the medicines, herbs, non-prescription drugs, or dietary supplements you use. Also tell them if you smoke, drink alcohol, or use illegal drugs. Some items may interact with your medicine. What should I watch for while using this medicine? Tell your doctor or health care professional if your symptoms do not improve. Do not treat diarrhea with over the counter products. Contact your doctor if you have diarrhea that lasts more than 2 days or if it is severe and watery. This medicine can make you more sensitive to the sun. Keep out of the sun. If you cannot avoid being in the sun, wear protective clothing and use sunscreen. Do not use sun lamps or tanning beds/booths. What side effects may I notice from receiving this medicine? Side effects that you should report to your doctor or health care professional as soon as possible: -allergic reactions like skin rash, itching or hives, swelling of the face, lips, or tongue -confusion, nightmares or hallucinations -dark urine -difficulty breathing -hearing loss -irregular heartbeat or chest pain -pain or difficulty passing urine -redness, blistering, peeling or loosening of the skin, including inside the mouth -white patches or sores in the mouth -yellowing of the eyes or skin Side effects that usually do not require medical attention (report to your doctor or health care professional if they continue or are bothersome): -diarrhea -dizziness, drowsiness -headache -stomach upset or vomiting -tooth discoloration -vaginal irritation This list may not describe all possible side effects. Call your doctor for medical advice about side  effects. You may report side effects to FDA at 1-800-FDA-1088. Where should I keep my medicine? Keep out of the reach of children. Store at room temperature between 15  and 30 degrees C (59 and 86 degrees F). Throw away any unused medicine after the expiration date. NOTE: This sheet is a summary. It may not cover all possible information. If you have questions about this medicine, talk to your doctor, pharmacist, or health care provider.  2013, Elsevier/Gold Standard. (04/25/2007 1:50:13 PM)

## 2012-11-20 NOTE — Progress Notes (Signed)
*  PRELIMINARY RESULTS* Vascular Ultrasound Left upper extremity venous duplex has been completed.  Preliminary findings: no evidence of Deep or superficial thrombosis.  Called report to Augustin Schooling, NP.   Farrel Demark, RDMS, RVT  11/20/2012, 3:50 PM

## 2012-11-20 NOTE — Telephone Encounter (Signed)
gv appt for 10/10 to access port...td

## 2012-11-20 NOTE — Telephone Encounter (Signed)
I spoke with pt. She is scheduled for PFT and appt with MW. Nothing further needed

## 2012-11-20 NOTE — Progress Notes (Signed)
[facility]  [phone] [phone]  OFFICE PROGRESS NOTE   PCP: Elesa Massed, M.D. SU: Emelia Loron, M.D. RAD ONC: Antony Blackbird, M.D.   DIAGNOSIS: [name] [name] is a 67 year-old female with a history of ductal carcinoma in situ of the left breast diagnosed 04/2009 and ovarian cancer diagnosed in 12/2011.   PRIOR THERAPY: #1. 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 Patient had 3 sentinel biopsied all of them were negative for metastatic disease. Patient underwent radiation therapy between 06/05/2009 through 07/02/2009. She was then begun on Tamoxifen 20 mg daily in 09/2009.  #2  In 12/2011 she received the diagnosis of gynecologic malignancy presenting with abdominal mass and pleural effusions. Patient was originally hospitalized with shortness of breath in 12/2011.  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 She also had malignant ascites.  She underwent bilateral thoracentesis and paracentesis procedures during her hospitalization. During her hospitalization she was seen by gynecologic oncology.  #3 Patient began neoadjuvant chemotherapy consisting of Taxol and Carboplatinum. Her first cycle was administered during her hospitalization. She has completed a total of 6 cycles of Taxol/Carboplatinum from 01/07/2012 - 06/23/2012. Overall she tolerated it well except for some fatigue and neuropathies.  #4 Patient is status post laparotomy in 05/2012 that revealed significant residual disease.   #5  Patient began adjuvant therapy with Topotecan/Avastin beginning on 07/24/2012.   CURRENT THERAPY: Topotecan/Avastin   INTERVAL HISTORY:  [name] 67 y.o. female returns today for followup of ovarian cancer.   She is doing well today.  Her shortness of breath is improved, particularly the  panicked feeling she gets when she is out of breath.  She received 2 units of blood a couple of weeks ago, and has continued on Prilosec and Pepcid along with the dietary changes recommended by Dr. Sherene Sires.  She follows up with Dr. Sherene Sires, on Thursday 11/23/12 after PFTs.  She has had nasal drainage and sinus pain that's been going on for 2 weeks.  She denies fevers.  She notes post nasal drip, and has a cough, but it is dry and occurs at night.  Otherwise, she is well and w/o questions/concerns.     MEDICAL HISTORY: Past Medical History  Diagnosis Date  . Interstitial cystitis     on chronic antibiotics  . Hyperlipidemia   . Frequent UTI     on prophylaxis  . 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 CAD (coronary artery disease)     a. s/p CABG 7/13;   b.  LHC 12/01/11:  pLAD 70%, mLAD 40%, CFX 40-50% prior to takeoff of the OM2, oRCA occluded, mid vessel filled via R->R collaterals and distal vessel filled by L->R collaterals, S-OM1/OM2 (small and diffusely dz) with mid 90% stenosis, 90% at OM1 anastomotic site, continuation of OM2 occluded, S-Dx patent, S-PDA occluded, L-LAD ok, EF 55-65%  => Med Rx rec.  [name] Hx of echocardiogram     Echo 5/13: EF 60-65%, grade 2 diastolic dysfunction  . Hypercholesterolemia   . Pleural effusion 12/28/11    s/p pleurx catheter  . GERD (gastroesophageal reflux disease)   . Arthritis     "just a little; lower back" (12/28/11)  . Gout attack 08/2011    related to "stress post OHS"  . Depression   . Breast cancer     "left"; on Tamoxifen  .  S/P thoracentesis 12/28/11    "for pleural effusion" (12/28/2011)  . Ovarian cancer   . Tachycardia   . Neuromuscular disorder     peripheral neuropathy    ALLERGIES:  is allergic to codeine; isosorbide; other; phenothiazines; talwin; yellow jacket venom; ambien; and tegaderm ag mesh.  MEDICATIONS:  Current Outpatient Prescriptions  Medication Sig Dispense Refill  . ALPRAZolam (XANAX) 0.25 MG tablet Take 1 tablet (0.25 mg total)  by mouth every 8 (eight) hours as needed for anxiety.  30 tablet  4  . Alum & Mag Hydroxide-Simeth (MAGIC MOUTHWASH) SOLN Take 5 mLs by mouth 4 (four) times daily.       [name] aspirin 325 MG tablet Take 325 mg by mouth at bedtime.       [name] atorvastatin (LIPITOR) 20 MG tablet TAKE ONE TABLET BY MOUTH ONCE DAILY  90 tablet  3  . carvedilol (COREG) 25 MG tablet Take 1 tablet (25 mg total) by mouth 2 (two) times daily.  180 tablet  3  . dexamethasone (DECADRON) 4 MG tablet Take 2 tablets (8 mg total) by mouth 2 (two) times daily with a meal. Take daily starting the day after chemotherapy for 2 days. Take with food.  30 tablet  1  . docusate sodium (COLACE) 100 MG capsule Take 200 mg by mouth daily.      [name] EPINEPHrine (EPIPEN 2-PAK) 0.3 mg/0.3 mL DEVI Inject 0.3 mg into the muscle daily as needed (allergic reaction).       . famotidine (PEPCID) 20 MG tablet Take 20 mg by mouth 2 (two) times daily.      . fluticasone (FLONASE) 50 MCG/ACT nasal spray Place 2 sprays into the nose daily as needed for allergies.      [name] gabapentin (NEURONTIN) 100 MG capsule Take 100 mg orally three times a day  90 capsule  6  . HYDROcodone-acetaminophen (NORCO/VICODIN) 5-325 MG per tablet Take 1-2 tablets by mouth every 4 (four) hours as needed.  40 tablet  2  . lidocaine-prilocaine (EMLA) cream Apply topically as needed.  30 g  1  . loratadine (CLARITIN) 10 MG tablet Take 10 mg by mouth daily.      . nitroGLYCERIN (NITROSTAT) 0.4 MG SL tablet Place 0.4 mg under the tongue every 5 (five) minutes as needed. Chest pain      . omeprazole (PRILOSEC) 20 MG capsule Take 40 mg by mouth daily.      . ondansetron (ZOFRAN) 8 MG tablet Take 1 tablet (8 mg total) by mouth 2 (two) times daily. Take two times a day starting the day after chemo for 2 days. Then take two times a day as needed for nausea or vomiting.  30 tablet  1  . oxybutynin (DITROPAN-XL) 10 MG 24 hr tablet Take 10 mg by mouth daily.      Bertram Gala Glycol-Propyl Glycol  0.4-0.3 % SOLN Place 1 drop into both eyes every morning.       . prochlorperazine (COMPAZINE) 25 MG suppository Place 1 suppository (25 mg total) rectally every 12 (twelve) hours as needed for nausea.  12 suppository  3  . senna (SENOKOT) 8.6 MG tablet Take 1 tablet by mouth daily as needed for constipation.      . SF 5000 PLUS 1.1 % CREA dental cream Place 1.1 % onto teeth at bedtime.       [name] trimethoprim (TRIMPEX) 100 MG tablet Take 100 mg by mouth every morning.       . valACYclovir (  VALTREX) 500 MG tablet Take 1 tablet (500 mg total) by mouth daily.  30 tablet  7  . venlafaxine XR (EFFEXOR-XR) 75 MG 24 hr capsule Take 75 mg  Orally daily  30 capsule  8  . azithromycin (ZITHROMAX Z-PAK) 250 MG tablet 2 tabs po x day 1, then one tab daily until complete  6 each  0   No current facility-administered medications for this visit.   Facility-Administered Medications Ordered in Other Visits  Medication Dose Route Frequency Provider Last Rate Last Dose  . influenza  inactive virus vaccine (FLUZONE/FLUARIX) injection 0.5 mL  0.5 mL Intramuscular Once Joselyn Arrow, MD        SURGICAL HISTORY:  Past Surgical History  Procedure Laterality Date  . Breast lumpectomy  04/2009    left  . Cesarean section  1973; 1976  . Muscle release  1960    L neck and chest.; "when I was 13; pneumonia settled in my left neck"  . Coronary artery bypass graft  08/18/2011    Procedure: CORONARY ARTERY BYPASS GRAFTING (CABG);  Surgeon: Alleen Borne, MD;  Location: [facility] OR;  Service: Open Heart Surgery;  Laterality: N/A;  Coronary Artery Bypass Graft times five utilizing the left internal mammary artery and the left greater saphenous vein harvested endoscopically.  . Abdominal hysterectomy  1976  . Appendectomy  1976  . Portacath placement  01/06/2012    Procedure: INSERTION PORT-A-CATH;  Surgeon: Alleen Borne, MD;  Location: [facility] OR;  Service: Thoracic;  Laterality: Left;  . Chest tube insertion  01/06/2012    Procedure:  INSERTION PLEURAL DRAINAGE CATHETER;  Surgeon: Alleen Borne, MD;  Location: [facility] OR;  Service: Thoracic;  Laterality: Bilateral;  . Removal of pleural drainage catheter Right 04/12/2012    Procedure: MINOR REMOVAL OF PLEURAL DRAINAGE CATHETER;  Surgeon: Alleen Borne, MD;  Location: [facility] OR;  Service: Thoracic;  Laterality: Right;  . Talc pleurodesis Left 04/12/2012    Procedure: Lurlean Nanny;  Surgeon: Alleen Borne, MD;  Location: [facility] OR;  Service: Thoracic;  Laterality: Left;  . Portacath placement Left 04/17/2012    Procedure: INSERTION PORT-A-CATH;  Surgeon: Alleen Borne, MD;  Location: [facility] OR;  Service: Thoracic;  Laterality: Left;  . Removal of pleural drainage catheter Left 04/17/2012    Procedure: REMOVAL OF PLEURAL DRAINAGE CATHETER;  Surgeon: Alleen Borne, MD;  Location: [facility] OR;  Service: Thoracic;  Laterality: Left;  . Port-a-cath removal Left 04/17/2012    Procedure: REMOVAL PORT-A-CATH;  Surgeon: Alleen Borne, MD;  Location: [facility] OR;  Service: Thoracic;  Laterality: Left;  . Cardiac catheterization    . Laparotomy Bilateral 05/30/2012    Procedure: Resection of umbilical mass, Partial omentectomy;  Surgeon: Jeannette Corpus, MD;  Location: [facility] ORS;  Service: Gynecology;  Laterality: Bilateral;    REVIEW OF SYSTEMS:   A 10 point review of systems was completed and is negative except as noted above.  PHYSICAL EXAMINATION:  BP 115/70  Pulse 81  Temp(Src) 97.5 F (36.4 C) (Oral)  Resp 18  Ht 5\' 6"  (1.676 m)  Wt 160 lb 8 oz (72.802 kg)  BMI 25.92 kg/m2  General: Patient is a well appearing female in no acute distress HEENT: PERRLA, sclerae anicteric, conjunctival pallor, MMM Neck: supple, no palpable adenopathy Lungs: clear to auscultation bilaterally, no wheezes, rhonchi, or rales Cardiovascular: regular rate rhythm, S1, S2, no murmurs, rubs or gallops Abdomen: Soft, non-tender, non-distended, normoactive bowel sounds, no HSM Extremities: warm  and well perfused, no  clubbing, cyanosis, or edema Skin: No rashes or lesions, pale Neuro: Non-focal  ECOG PERFORMANCE STATUS: 1 - Symptomatic but completely ambulatory.   LABORATORY DATA: Lab Results  Component Value Date   WBC 3.8* 11/20/2012   HGB 10.9* 11/20/2012   HCT 34.8 11/20/2012   MCV 94.1 11/20/2012   PLT 363 11/20/2012      Chemistry      Component Value Date/Time   NA 143 11/20/2012 1116   NA 134* 06/02/2012 0515   NA 141 12/17/2011 1144   K 3.9 11/20/2012 1116   K 4.4 06/02/2012 0515   CL 103 08/07/2012 1108   CL 100 06/02/2012 0515   CO2 21* 11/20/2012 1116   CO2 25 06/02/2012 0515   BUN 13.8 11/20/2012 1116   BUN 20 06/02/2012 0515   BUN 15 12/17/2011 1144   CREATININE 0.7 11/20/2012 1116   CREATININE 0.76 06/02/2012 0515      Component Value Date/Time   CALCIUM 9.0 11/20/2012 1116   CALCIUM 8.7 06/02/2012 0515   ALKPHOS 71 11/20/2012 1116   ALKPHOS 85 05/26/2012 0955   AST 25 11/20/2012 1116   AST 22 05/26/2012 0955   ALT 30 11/20/2012 1116   ALT 16 05/26/2012 0955   BILITOT 0.36 11/20/2012 1116   BILITOT 0.3 05/26/2012 0955       RADIOGRAPHIC STUDIES: Dg Chest 2 View 08/30/2012   *RADIOLOGY REPORT*  Clinical Data: Shortness of breath, history metastatic ovarian carcinoma and history of bilateral pleural effusions.  CHEST - 2 VIEW  Comparison: 05/26/2012  Findings: There is significant diminishment in bilateral pleural effusions since the prior study with only trace amount of pleural fluid present bilaterally.  The lungs show no evidence of focal consolidation, masses or pulmonary edema.  Port-A-Cath positioning stable in the SVC.  The heart size is normal.  No bony lesions are seen.  IMPRESSION: Significant diminishment in bilateral pleural effusions since the prior chest x-ray. Trace bilateral pleural effusions remain.  No acute findings.   Original Report Authenticated By: Irish Lack, M.D.     ASSESSMENT: 67 year old woman:   #1 History of DCIS of the left breast status post lumpectomy  in 04/2009.  Status post radiation therapy completed in 06/2009.  Was on antiestrogen therapy with Tamoxifen 20 mg by mouth daily that was started in 09/2009.  Tamoxifen has been discontinued for now due to her recent diagnosis of ovarian cancer.  #2 Gynecologic malignancy (ovarian carcinoma) diagnosed in 12/2011. Patient received 6 cycles of neoadjuvant chemotherapy consisting of Taxol/Carboplatinum completed in 06/2012.  #3 She is status post laparotomy performed on 05/30/2012. Unfortunately, intraoperatively she was found to have significant residual disease. 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   #4 Patient completed chemotherapy sensitivity testing. Unfortunately the results were inconclusive.  It was decided to proceed with chemotherapy consisting of Topotecan and Avastin.  Dr. Grant Ruts also recommended this treatment regimen.  #5 Patient was started chemotherapy consisting of Topotecan and Avastin  With Topotecan q week on day 1, 8,15 on a 28 day cycle, and Avastin scheduled for every 3 weeks.  #6 Depression/anxiety: Currently on Effexor with good response  #7 Shortness of breath: patient has been seen by cardilogist Dr. Elease Hashimoto and cardiac etiology was ruled out.  She will f/u with Dr. Sherene Sires on 11/23/12, he has already begun her evaluation.    #8 History of dehydration -patient instructed to drink  plenty of water  #9  Cold sores/herpes labialis.  #10 Neuropathy - currently on Gabapentin 100 mg by mouth 3 times a day.  Neuropathy is currently resolved with this.     PLAN: #1  Doing well.  Labs are stable, she will proceed with Topotecan and Avastin today.     #2 She will continue Valtrex for her mouth and magic mouthwash as needed.   #3 She will return on 11/27/12 for labs and evaluation for chemotoxicities.    #4 I prescribed a Zpak for sinusitis as she is getting chemotherapy today.    All  questions were answered.  Patient was encouraged to contact us with any questions, problems or concerns.   I spent 25 minutes counseling the patient face to face.  The total time spent in the appointment was 30 minutes.  Cherie Ouch Lyn Hollingshead, NP Medical Oncology [facility] Phone: [phone] 11/21/2012  9:33 PM

## 2012-11-22 ENCOUNTER — Ambulatory Visit (INDEPENDENT_AMBULATORY_CARE_PROVIDER_SITE_OTHER): Payer: Medicare Other | Admitting: Internal Medicine

## 2012-11-22 DIAGNOSIS — J90 Pleural effusion, not elsewhere classified: Secondary | ICD-10-CM

## 2012-11-22 DIAGNOSIS — R0602 Shortness of breath: Secondary | ICD-10-CM

## 2012-11-22 NOTE — Progress Notes (Signed)
PFT done today. 

## 2012-11-23 ENCOUNTER — Institutional Professional Consult (permissible substitution): Payer: Medicare Other | Admitting: Pulmonary Disease

## 2012-11-23 ENCOUNTER — Ambulatory Visit (INDEPENDENT_AMBULATORY_CARE_PROVIDER_SITE_OTHER): Payer: Medicare Other | Admitting: Internal Medicine

## 2012-11-23 ENCOUNTER — Encounter: Payer: Self-pay | Admitting: Internal Medicine

## 2012-11-23 VITALS — BP 120/64 | HR 64 | Temp 97.8°F | Ht 66.0 in | Wt 161.2 lb

## 2012-11-23 DIAGNOSIS — R0609 Other forms of dyspnea: Secondary | ICD-10-CM

## 2012-11-23 DIAGNOSIS — R0600 Dyspnea, unspecified: Secondary | ICD-10-CM

## 2012-11-23 DIAGNOSIS — R05 Cough: Secondary | ICD-10-CM

## 2012-11-23 DIAGNOSIS — J90 Pleural effusion, not elsewhere classified: Secondary | ICD-10-CM

## 2012-11-23 NOTE — Assessment & Plan Note (Addendum)
Bilateral pos cyt for Ca > pleurovac on R and VATs/ pleurodesis on L completed Jan 2014   Has CT chest tomorrow > refer back to T surgery if significant reaccumulation because nothing to offer here for this sp procedures on both sides

## 2012-11-23 NOTE — Patient Instructions (Addendum)
.    To get the most out of exercise, you need to be continuously aware that you are short of breath, but never out of breath, for 30 minutes daily. As you improve, it will actually be easier for you to do the same amount of exercise  in  30 minutes so always push to the level where you are short of breath.    If loosing ground with activity tolerance please call, otherwise follow up is as needed - good luck!

## 2012-11-23 NOTE — Progress Notes (Signed)
Subjective:    Patient ID: [name] [name], female    DOB: [birth year]  MRN: [id number]  Brief patient profile:   45 yowf quit smoking 02/2001 with ex cp 2013 > CABG July 2013 then progressive sob dx with  ovarian ca with bilateral pl effusions 12/2011 required pleurovac on R and VATs/ pleurodesis on L completed Jan 2014 then chemo and did better until around early Aug 2014 with recurrent doe dx with pleural effusions referred 10/31/2012 to pulmonary clinic by Dr Park Breed   History of Present Illness   10/31/2012 f/u ov/[name] [name] cc variable sob x 6 weeks better if lie down in recliner at 30 degreee,assoc with pnds that is new and sometimes yellow and bloody, worse first thing in am. Sob can be just as bad at rest as with ex, brought on by talking. rec If the wheezing returns we will need to consider replacing the corevidol Try prilosec 20mg   Take 30-60 min before first meal of the day and Pepcid 20 mg one bedtime until return Please see patient coordinator before you leave today  to schedule sinus CT GERD diet   11/23/2012 f/u ov/[name] [name] re: better p zpak for "sinus" p trip  To [place] on plane Chief Complaint  Patient presents with  . Follow-up    had pft yesterday-doing better,sob same,gasping has calmed down,cough-dry,denies wheezing,when sneezes hurts on both sides   sob x exertion =  Like what ?  A:  across parking lot (much better than prev ov but still present on day of ov.    No obvious day to day or daytime variabilty or assoc chronic cough or cp or chest tightness, subjective wheeze overt sinus or hb symptoms. No unusual exp hx or h/o childhood pna/ asthma or knowledge of premature birth.  Sleeping ok without nocturnal  or early am exacerbation  of respiratory  c/o's or need for noct saba. Also denies any obvious fluctuation of symptoms with weather or environmental changes or other aggravating or alleviating factors except as outlined above   Current Medications, Allergies, Complete  Past Medical History, Past Surgical History, Family History, and Social History were reviewed in [employer] record.  ROS  The following are not active complaints unless bolded sore throat, dysphagia, dental problems, itching, sneezing,  nasal congestion or excess/ purulent secretions, ear ache,   fever, chills, sweats, unintended wt loss, pleuritic or exertional cp, hemoptysis,  orthopnea pnd or leg swelling, presyncope, palpitations, heartburn, abdominal pain, anorexia, nausea, vomiting, diarrhea  or change in bowel or urinary habits, change in stools or urine, dysuria,hematuria,  rash, arthralgias, visual complaints, headache, numbness weakness or ataxia or problems with walking or coordination,  change in mood/affect or memory.                Objective:   Physical Exam   11/23/2012        160  Wt Readings from Last 3 Encounters:  10/31/12 154 lb 12.8 oz (70.217 kg)  10/30/12 153 lb 6.4 oz (69.582 kg)  10/23/12 153 lb 8 oz (69.627 kg)    Pleasant amb talkative wf who prefers to answer all questions with medical terminology  HEENT: nl dentition, turbinates, and orophanx. Nl external ear canals without cough reflex   NECK :  without JVD/Nodes/TM/ nl carotid upstrokes bilaterally   LUNGS: no acc muscle use, clear to A and P bilaterally without cough on insp or exp maneuvers   CV:  RRR  no s3 or murmur or increase in  P2, no edema   ABD:  soft and nontender with nl excursion in the supine position. No bruits or organomegaly, bowel sounds nl  MS:  warm without deformities, calf tenderness, cyanosis or clubbing  SKIN: warm and dry without lesions    NEURO:  alert, approp, no deficits    cxr 10/19/12 reviewed  Small bilateral pleural effusions no worse than baseline since tubes out       Assessment & Plan:

## 2012-11-24 ENCOUNTER — Ambulatory Visit (HOSPITAL_BASED_OUTPATIENT_CLINIC_OR_DEPARTMENT_OTHER): Payer: Medicare Other

## 2012-11-24 ENCOUNTER — Ambulatory Visit (HOSPITAL_COMMUNITY)
Admission: RE | Admit: 2012-11-24 | Discharge: 2012-11-24 | Disposition: A | Discharge status: Home / Self Care | Payer: Medicare Other | Source: Ambulatory Visit | Attending: Oncology | Admitting: Oncology

## 2012-11-24 VITALS — BP 145/82 | HR 76 | Temp 97.5°F

## 2012-11-24 DIAGNOSIS — C569 Malignant neoplasm of unspecified ovary: Secondary | ICD-10-CM

## 2012-11-24 DIAGNOSIS — J90 Pleural effusion, not elsewhere classified: Secondary | ICD-10-CM | POA: Insufficient documentation

## 2012-11-24 DIAGNOSIS — Z452 Encounter for adjustment and management of vascular access device: Secondary | ICD-10-CM

## 2012-11-24 DIAGNOSIS — C778 Secondary and unspecified malignant neoplasm of lymph nodes of multiple regions: Secondary | ICD-10-CM | POA: Insufficient documentation

## 2012-11-24 DIAGNOSIS — R911 Solitary pulmonary nodule: Secondary | ICD-10-CM | POA: Insufficient documentation

## 2012-11-24 LAB — GLUCOSE, CAPILLARY: Glucose-Capillary: 86 mg/dL (ref 70–99)

## 2012-11-24 MED ORDER — FLUDEOXYGLUCOSE F - 18 (FDG) INJECTION
20.6000 | Freq: Once | INTRAVENOUS | Status: AC | PRN
  Administered 2012-11-24: 20.6 via INTRAVENOUS

## 2012-11-24 MED ORDER — FLUDEOXYGLUCOSE F - 18 (FDG) INJECTION: 20.6000 | Freq: Once | INTRAVENOUS | Status: AC | PRN

## 2012-11-24 MED ORDER — SODIUM CHLORIDE 0.9 % IJ SOLN
10.0000 mL | INTRAMUSCULAR | Status: DC | PRN
  Administered 2012-11-24: 10 mL via INTRAVENOUS
  Filled 2012-11-24: qty 10

## 2012-11-24 MED ORDER — IOHEXOL 300 MG/ML  SOLN
100.0000 mL | Freq: Once | INTRAMUSCULAR | Status: AC | PRN
  Administered 2012-11-24: 100 mL via INTRAVENOUS

## 2012-11-24 MED ORDER — HEPARIN SOD (PORK) LOCK FLUSH 100 UNIT/ML IV SOLN
500.0000 [IU] | Freq: Once | INTRAVENOUS | Status: DC
  Filled 2012-11-24: qty 5

## 2012-11-25 NOTE — Assessment & Plan Note (Addendum)
-   10/31/2012  Walked RA x 3 laps @ 185 ft each stopped due to  End of study, no sob, no tachycardia nor desats - 11/23/2012 PFT's FEV1  2.04 ( 79%) ratio 85 and no change p B2 and DLCO 50 corrects to 67% - 11/23/2012  Walked RA x 3 laps @ 185 ft each stopped due to end of study , no desats  I had an extended summary discussion with the patient today lasting 15 to 20 minutes of a 25 minute visit on the following issues:  PFT's from yesterday reviewed Not able to reproduce her symptoms of sob "every time I  cross your parking lot" on same day in office but strongly suspect now this is a conditioning issue and I reviewed with her why this might be and what to do about it.   See instructions for specific recommendations which were reviewed directly with the patient who was given a copy with highlighter outlining the key components.

## 2012-11-25 NOTE — Assessment & Plan Note (Signed)
-   Sinus CT 11/07/2012 >> Negative paranasal sinuses.  Resolved on max gerd rx > rec she continue this and f/u prn

## 2012-11-27 ENCOUNTER — Other Ambulatory Visit (HOSPITAL_BASED_OUTPATIENT_CLINIC_OR_DEPARTMENT_OTHER): Payer: Medicare Other | Admitting: Lab

## 2012-11-27 ENCOUNTER — Ambulatory Visit (HOSPITAL_BASED_OUTPATIENT_CLINIC_OR_DEPARTMENT_OTHER): Payer: Medicare Other | Admitting: Adult Health

## 2012-11-27 ENCOUNTER — Encounter: Payer: Self-pay | Admitting: Internal Medicine

## 2012-11-27 ENCOUNTER — Ambulatory Visit (HOSPITAL_BASED_OUTPATIENT_CLINIC_OR_DEPARTMENT_OTHER): Payer: Medicare Other

## 2012-11-27 ENCOUNTER — Encounter: Payer: Self-pay | Admitting: Adult Health

## 2012-11-27 VITALS — BP 125/74 | HR 78 | Temp 98.0°F | Resp 20 | Ht 66.0 in | Wt 157.4 lb

## 2012-11-27 DIAGNOSIS — C569 Malignant neoplasm of unspecified ovary: Secondary | ICD-10-CM

## 2012-11-27 DIAGNOSIS — D0590 Unspecified type of carcinoma in situ of unspecified breast: Secondary | ICD-10-CM

## 2012-11-27 DIAGNOSIS — F341 Dysthymic disorder: Secondary | ICD-10-CM

## 2012-11-27 DIAGNOSIS — C786 Secondary malignant neoplasm of retroperitoneum and peritoneum: Secondary | ICD-10-CM

## 2012-11-27 DIAGNOSIS — Z5111 Encounter for antineoplastic chemotherapy: Secondary | ICD-10-CM

## 2012-11-27 DIAGNOSIS — R0602 Shortness of breath: Secondary | ICD-10-CM

## 2012-11-27 LAB — CBC WITH DIFFERENTIAL/PLATELET
BASO%: 1.5 % (ref 0.0–2.0)
Basophils Absolute: 0.1 10*3/uL (ref 0.0–0.1)
EOS%: 2.7 % (ref 0.0–7.0)
Eosinophils Absolute: 0.1 10*3/uL (ref 0.0–0.5)
HCT: 34.7 % — ABNORMAL LOW (ref 34.8–46.6)
HGB: 10.7 g/dL — ABNORMAL LOW (ref 11.6–15.9)
LYMPH%: 19.7 % (ref 14.0–49.7)
MCH: 29.2 pg (ref 25.1–34.0)
MCHC: 30.8 g/dL — ABNORMAL LOW (ref 31.5–36.0)
MCV: 94.8 fL (ref 79.5–101.0)
MONO#: 0.6 10*3/uL (ref 0.1–0.9)
MONO%: 11.5 % (ref 0.0–14.0)
NEUT#: 3.1 10*3/uL (ref 1.5–6.5)
NEUT%: 64.6 % (ref 38.4–76.8)
Platelets: 390 10*3/uL (ref 145–400)
RBC: 3.66 10*6/uL — ABNORMAL LOW (ref 3.70–5.45)
RDW: 22 % — ABNORMAL HIGH (ref 11.2–14.5)
WBC: 4.8 10*3/uL (ref 3.9–10.3)
lymph#: 0.9 10*3/uL (ref 0.9–3.3)
nRBC: 5 % — ABNORMAL HIGH (ref 0–0)

## 2012-11-27 LAB — COMPREHENSIVE METABOLIC PANEL (CC13)
ALT: 29 U/L (ref 0–55)
AST: 23 U/L (ref 5–34)
Albumin: 3.3 g/dL — ABNORMAL LOW (ref 3.5–5.0)
Alkaline Phosphatase: 74 U/L (ref 40–150)
Anion Gap: 10 meq/L (ref 3–11)
BUN: 17.3 mg/dL (ref 7.0–26.0)
CO2: 24 meq/L (ref 22–29)
Calcium: 9.6 mg/dL (ref 8.4–10.4)
Chloride: 106 meq/L (ref 98–109)
Creatinine: 0.8 mg/dL (ref 0.6–1.1)
Glucose: 95 mg/dL (ref 70–140)
Potassium: 4.4 meq/L (ref 3.5–5.1)
Sodium: 141 meq/L (ref 136–145)
Total Bilirubin: 0.36 mg/dL (ref 0.20–1.20)
Total Protein: 6.7 g/dL (ref 6.4–8.3)

## 2012-11-27 LAB — UA PROTEIN, DIPSTICK - CHCC: Protein, ur: NEGATIVE mg/dL

## 2012-11-27 LAB — TECHNOLOGIST REVIEW

## 2012-11-27 MED ORDER — SODIUM CHLORIDE 0.9 % IJ SOLN
10.0000 mL | INTRAMUSCULAR | Status: DC | PRN
  Administered 2012-11-27: 10 mL
  Filled 2012-11-27: qty 10

## 2012-11-27 MED ORDER — DEXAMETHASONE SODIUM PHOSPHATE 10 MG/ML IJ SOLN
10.0000 mg | Freq: Once | INTRAMUSCULAR | Status: AC
  Administered 2012-11-27: 10 mg via INTRAVENOUS

## 2012-11-27 MED ORDER — SODIUM CHLORIDE 0.9 % IV SOLN: 15.0000 mg/kg | Freq: Once | INTRAVENOUS | Status: DC

## 2012-11-27 MED ORDER — SODIUM CHLORIDE 0.9 % IV SOLN: Freq: Once | INTRAVENOUS | Status: DC

## 2012-11-27 MED ORDER — HEPARIN SOD (PORK) LOCK FLUSH 100 UNIT/ML IV SOLN
500.0000 [IU] | Freq: Once | INTRAVENOUS | Status: AC | PRN
  Administered 2012-11-27: 500 [IU]
  Filled 2012-11-27: qty 5

## 2012-11-27 MED ORDER — DEXAMETHASONE SODIUM PHOSPHATE 10 MG/ML IJ SOLN
INTRAMUSCULAR | Status: AC
  Filled 2012-11-27: qty 1

## 2012-11-27 MED ORDER — TOPOTECAN HCL CHEMO INJECTION 4 MG
4.0000 mg/m2 | Freq: Once | INTRAVENOUS | Status: AC
  Administered 2012-11-27: 7 mg via INTRAVENOUS
  Filled 2012-11-27: qty 7

## 2012-11-27 MED ORDER — ONDANSETRON 8 MG/NS 50 ML IVPB
INTRAVENOUS | Status: AC
  Filled 2012-11-27: qty 8

## 2012-11-27 MED ORDER — ONDANSETRON 8 MG/50ML IVPB (CHCC)
8.0000 mg | Freq: Once | INTRAVENOUS | Status: AC
  Administered 2012-11-27: 8 mg via INTRAVENOUS

## 2012-11-27 NOTE — Progress Notes (Signed)
[facility]  [phone] [phone]  OFFICE PROGRESS NOTE   PCP: Elesa Massed, M.D. SU: Emelia Loron, M.D. RAD ONC: Antony Blackbird, M.D.   DIAGNOSIS: [name] [name] is a 67 year-old female with a history of ductal carcinoma in situ of the left breast diagnosed 04/2009 and ovarian cancer diagnosed in 12/2011.   PRIOR THERAPY: #1. 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 Patient had 3 sentinel biopsied all of them were negative for metastatic disease. Patient underwent radiation therapy between 06/05/2009 through 07/02/2009. She was then begun on Tamoxifen 20 mg daily in 09/2009.  #2  In 12/2011 she received the diagnosis of gynecologic malignancy presenting with abdominal mass and pleural effusions. Patient was originally hospitalized with shortness of breath in 12/2011.  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 She also had malignant ascites.  She underwent bilateral thoracentesis and paracentesis procedures during her hospitalization. During her hospitalization she was seen by gynecologic oncology.  #3 Patient began neoadjuvant chemotherapy consisting of Taxol and Carboplatinum. Her first cycle was administered during her hospitalization. She has completed a total of 6 cycles of Taxol/Carboplatinum from 01/07/2012 - 06/23/2012. Overall she tolerated it well except for some fatigue and neuropathies.  #4 Patient is status post laparotomy in 05/2012 that revealed significant residual disease.   #5  Patient began adjuvant therapy with Topotecan/Avastin beginning on 07/24/2012.   CURRENT THERAPY: Topotecan/Avastin   INTERVAL HISTORY:  [name] 67 y.o. female returns today for followup of ovarian cancer.   She is doing well today. Her shortness of breath continues to improve with her  dietary modifications and PPI therapy.  She appeared to have an upper respiratory tract infection last week and responded to a Zpak.  She denies fevers, chills, nausea, vomiting, constipation, diarrhea, numbness, or further concerns.  They are here to get to her CT results as well.     MEDICAL HISTORY: Past Medical History  Diagnosis Date  . Interstitial cystitis     on chronic antibiotics  . Hyperlipidemia   . Frequent UTI     on prophylaxis  . 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 CAD (coronary artery disease)     a. s/p CABG 7/13;   b.  LHC 12/01/11:  pLAD 70%, mLAD 40%, CFX 40-50% prior to takeoff of the OM2, oRCA occluded, mid vessel filled via R->R collaterals and distal vessel filled by L->R collaterals, S-OM1/OM2 (small and diffusely dz) with mid 90% stenosis, 90% at OM1 anastomotic site, continuation of OM2 occluded, S-Dx patent, S-PDA occluded, L-LAD ok, EF 55-65%  => Med Rx rec.  [name] Hx of echocardiogram     Echo 5/13: EF 60-65%, grade 2 diastolic dysfunction  . Hypercholesterolemia   . Pleural effusion 12/28/11    s/p pleurx catheter  . GERD (gastroesophageal reflux disease)   . Arthritis     "just a little; lower back" (12/28/11)  . Gout attack 08/2011    related to "stress post OHS"  . Depression   . Breast cancer     "left"; on Tamoxifen  . S/P thoracentesis 12/28/11    "for pleural effusion" (12/28/2011)  . Ovarian cancer   . Tachycardia   . Neuromuscular disorder     peripheral neuropathy    ALLERGIES:  is allergic to codeine; isosorbide; other; phenothiazines; talwin; yellow jacket venom; ambien; and tegaderm ag  mesh.  MEDICATIONS:  Current Outpatient Prescriptions  Medication Sig Dispense Refill  . ALPRAZolam (XANAX) 0.25 MG tablet Take 1 tablet (0.25 mg total) by mouth every 8 (eight) hours as needed for anxiety.  30 tablet  4  . Alum & Mag Hydroxide-Simeth (MAGIC MOUTHWASH) SOLN Take 5 mLs by mouth 4 (four) times daily.       [name] aspirin 325 MG tablet Take 325 mg by  mouth at bedtime.       [name] atorvastatin (LIPITOR) 20 MG tablet TAKE ONE TABLET BY MOUTH ONCE DAILY  90 tablet  3  . carvedilol (COREG) 25 MG tablet Take 1 tablet (25 mg total) by mouth 2 (two) times daily.  180 tablet  3  . dexamethasone (DECADRON) 4 MG tablet Take 2 tablets (8 mg total) by mouth 2 (two) times daily with a meal. Take daily starting the day after chemotherapy for 2 days. Take with food.  30 tablet  1  . docusate sodium (COLACE) 100 MG capsule Take 200 mg by mouth daily.      [name] EPINEPHrine (EPIPEN 2-PAK) 0.3 mg/0.3 mL DEVI Inject 0.3 mg into the muscle daily as needed (allergic reaction).       . famotidine (PEPCID) 20 MG tablet Take 20 mg by mouth. Take 1 tablet at bedtime by mouth      . fluticasone (FLONASE) 50 MCG/ACT nasal spray Place 2 sprays into the nose daily as needed for allergies.      [name] gabapentin (NEURONTIN) 100 MG capsule Take 100 mg orally three times a day  90 capsule  6  . HYDROcodone-acetaminophen (NORCO/VICODIN) 5-325 MG per tablet Take 1-2 tablets by mouth every 4 (four) hours as needed.  40 tablet  2  . lidocaine-prilocaine (EMLA) cream Apply topically as needed.  30 g  1  . omeprazole (PRILOSEC) 20 MG capsule Take 40 mg by mouth daily.      . ondansetron (ZOFRAN) 8 MG tablet Take 1 tablet (8 mg total) by mouth 2 (two) times daily. Take two times a day starting the day after chemo for 2 days. Then take two times a day as needed for nausea or vomiting.  30 tablet  1  . oxybutynin (DITROPAN-XL) 10 MG 24 hr tablet Take 10 mg by mouth daily.      . prochlorperazine (COMPAZINE) 25 MG suppository Place 1 suppository (25 mg total) rectally every 12 (twelve) hours as needed for nausea.  12 suppository  3  . senna (SENOKOT) 8.6 MG tablet Take 1 tablet by mouth daily as needed for constipation.      . SF 5000 PLUS 1.1 % CREA dental cream Place 1.1 % onto teeth at bedtime.       [name] trimethoprim (TRIMPEX) 100 MG tablet Take 100 mg by mouth every morning.       . valACYclovir  (VALTREX) 500 MG tablet Take 1 tablet (500 mg total) by mouth daily.  30 tablet  7  . venlafaxine XR (EFFEXOR-XR) 75 MG 24 hr capsule Take 75 mg  Orally daily  30 capsule  8  . azithromycin (ZITHROMAX Z-PAK) 250 MG tablet 2 tabs po x day 1, then one tab daily until complete  6 each  0  . loratadine (CLARITIN) 10 MG tablet Take 10 mg by mouth. Take 1 tablet daily as needed by mouth      . nitroGLYCERIN (NITROSTAT) 0.4 MG SL tablet Place 0.4 mg under the tongue every 5 (five) minutes as needed. Chest pain      .  Polyethyl Glycol-Propyl Glycol 0.4-0.3 % SOLN Place 1 drop into both eyes every morning. As needed       No current facility-administered medications for this visit.   Facility-Administered Medications Ordered in Other Visits  Medication Dose Route Frequency Provider Last Rate Last Dose  . influenza  inactive virus vaccine (FLUZONE/FLUARIX) injection 0.5 mL  0.5 mL Intramuscular Once Joselyn Arrow, MD        SURGICAL HISTORY:  Past Surgical History  Procedure Laterality Date  . Breast lumpectomy  04/2009    left  . Cesarean section  1973; 1976  . Muscle release  1960    L neck and chest.; "when I was 13; pneumonia settled in my left neck"  . Coronary artery bypass graft  08/18/2011    Procedure: CORONARY ARTERY BYPASS GRAFTING (CABG);  Surgeon: Alleen Borne, MD;  Location: [facility] OR;  Service: Open Heart Surgery;  Laterality: N/A;  Coronary Artery Bypass Graft times five utilizing the left internal mammary artery and the left greater saphenous vein harvested endoscopically.  . Abdominal hysterectomy  1976  . Appendectomy  1976  . Portacath placement  01/06/2012    Procedure: INSERTION PORT-A-CATH;  Surgeon: Alleen Borne, MD;  Location: [facility] OR;  Service: Thoracic;  Laterality: Left;  . Chest tube insertion  01/06/2012    Procedure: INSERTION PLEURAL DRAINAGE CATHETER;  Surgeon: Alleen Borne, MD;  Location: [facility] OR;  Service: Thoracic;  Laterality: Bilateral;  . Removal of pleural drainage  catheter Right 04/12/2012    Procedure: MINOR REMOVAL OF PLEURAL DRAINAGE CATHETER;  Surgeon: Alleen Borne, MD;  Location: [facility] OR;  Service: Thoracic;  Laterality: Right;  . Talc pleurodesis Left 04/12/2012    Procedure: Lurlean Nanny;  Surgeon: Alleen Borne, MD;  Location: [facility] OR;  Service: Thoracic;  Laterality: Left;  . Portacath placement Left 04/17/2012    Procedure: INSERTION PORT-A-CATH;  Surgeon: Alleen Borne, MD;  Location: [facility] OR;  Service: Thoracic;  Laterality: Left;  . Removal of pleural drainage catheter Left 04/17/2012    Procedure: REMOVAL OF PLEURAL DRAINAGE CATHETER;  Surgeon: Alleen Borne, MD;  Location: [facility] OR;  Service: Thoracic;  Laterality: Left;  . Port-a-cath removal Left 04/17/2012    Procedure: REMOVAL PORT-A-CATH;  Surgeon: Alleen Borne, MD;  Location: [facility] OR;  Service: Thoracic;  Laterality: Left;  . Cardiac catheterization    . Laparotomy Bilateral 05/30/2012    Procedure: Resection of umbilical mass, Partial omentectomy;  Surgeon: Jeannette Corpus, MD;  Location: [facility] ORS;  Service: Gynecology;  Laterality: Bilateral;    REVIEW OF SYSTEMS:   A 10 point review of systems was completed and is negative except as noted above.  PHYSICAL EXAMINATION:  BP 125/74  Pulse 78  Temp(Src) 98 F (36.7 C) (Oral)  Resp 20  Ht 5\' 6"  (1.676 m)  Wt 157 lb 6.4 oz (71.396 kg)  BMI 25.42 kg/m2  General: Patient is a well appearing female in no acute distress HEENT: PERRLA, sclerae anicteric, conjunctival pallor, MMM Neck: supple, no palpable adenopathy Lungs: clear to auscultation bilaterally, no wheezes, rhonchi, or rales Cardiovascular: regular rate rhythm, S1, S2, no murmurs, rubs or gallops Abdomen: Soft, non-tender, non-distended, normoactive bowel sounds, no HSM Extremities: warm and well perfused, no clubbing, cyanosis, or edema Skin: No rashes or lesions, pale Neuro: Non-focal ECOG PERFORMANCE STATUS: 1 - Symptomatic but completely ambulatory.   LABORATORY  DATA: Lab Results  Component Value Date   WBC 4.8 11/27/2012   HGB  10.7* 11/27/2012   HCT 34.7* 11/27/2012   MCV 94.8 11/27/2012   PLT 390 11/27/2012      Chemistry      Component Value Date/Time   NA 143 11/20/2012 1116   NA 134* 06/02/2012 0515   NA 141 12/17/2011 1144   K 3.9 11/20/2012 1116   K 4.4 06/02/2012 0515   CL 103 08/07/2012 1108   CL 100 06/02/2012 0515   CO2 21* 11/20/2012 1116   CO2 25 06/02/2012 0515   BUN 13.8 11/20/2012 1116   BUN 20 06/02/2012 0515   BUN 15 12/17/2011 1144   CREATININE 0.7 11/20/2012 1116   CREATININE 0.76 06/02/2012 0515      Component Value Date/Time   CALCIUM 9.0 11/20/2012 1116   CALCIUM 8.7 06/02/2012 0515   ALKPHOS 71 11/20/2012 1116   ALKPHOS 85 05/26/2012 0955   AST 25 11/20/2012 1116   AST 22 05/26/2012 0955   ALT 30 11/20/2012 1116   ALT 16 05/26/2012 0955   BILITOT 0.36 11/20/2012 1116   BILITOT 0.3 05/26/2012 0955       RADIOGRAPHIC STUDIES: Dg Chest 2 View 08/30/2012   *RADIOLOGY REPORT*  Clinical Data: Shortness of breath, history metastatic ovarian carcinoma and history of bilateral pleural effusions.  CHEST - 2 VIEW  Comparison: 05/26/2012  Findings: There is significant diminishment in bilateral pleural effusions since the prior study with only trace amount of pleural fluid present bilaterally.  The lungs show no evidence of focal consolidation, masses or pulmonary edema.  Port-A-Cath positioning stable in the SVC.  The heart size is normal.  No bony lesions are seen.  IMPRESSION: Significant diminishment in bilateral pleural effusions since the prior chest x-ray. Trace bilateral pleural effusions remain.  No acute findings.   Original Report Authenticated By: Irish Lack, M.D.     ASSESSMENT: 67 year old woman:   #1 History of DCIS of the left breast status post lumpectomy in 04/2009.  Status post radiation therapy completed in 06/2009.  Was on antiestrogen therapy with Tamoxifen 20 mg by mouth daily that was started in 09/2009.   Tamoxifen has been discontinued for now due to her recent diagnosis of ovarian cancer.  #2 Gynecologic malignancy (ovarian carcinoma) diagnosed in 12/2011. Patient received 6 cycles of neoadjuvant chemotherapy consisting of Taxol/Carboplatinum completed in 06/2012.  #3 She is status post laparotomy performed on 05/30/2012. Unfortunately, intraoperatively she was found to have significant residual disease. 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   #4 Patient completed chemotherapy sensitivity testing. Unfortunately the results were inconclusive.  It was decided to proceed with chemotherapy consisting of Topotecan and Avastin.  Dr. Grant Ruts also recommended this treatment regimen.  #5 Patient was started chemotherapy consisting of Topotecan and Avastin  With Topotecan q week on day 1, 8,15 on a 28 day cycle, and Avastin scheduled for every 3 weeks.  #6 Depression/anxiety: Currently on Effexor with good response  #7 Shortness of breath: patient has been seen by cardilogist Dr. Elease Hashimoto and cardiac etiology was ruled out. Patient followed by Dr. Sherene Sires, last appt on 11/23/12 and he recommended a re-conditioning and exercise plan.   #8 History of dehydration -patient instructed to drink plenty of water  #9  Cold sores/herpes labialis.  #10 Neuropathy - currently on Gabapentin 100 mg by mouth 3 times a day.  Neuropathy is currently resolved with this.     PLAN: #1  Doing well.  Labs are stable.  Patient with recent CT scans and PET scan.  I gave these scans to Dr. Welton Flakes to review.  I reviewed them with the patient.  Patient will continue with current treatment plan.    #2 Patient will return next week for labs and treatment.  I continued to encourage her to exercise per Dr. Sherene Sires.    All questions were answered.  Patient was encouraged to contact us with any questions, problems or concerns.   I spent 25 minutes  counseling the patient face to face.  The total time spent in the appointment was 30 minutes.  Cherie Ouch Lyn Hollingshead, NP Medical Oncology [facility] Phone: [phone] 11/27/2012  9:46 AM

## 2012-11-27 NOTE — Patient Instructions (Signed)
[facility] Discharge Instructions for Patients Receiving Chemotherapy  Today you received the following chemotherapy agents Topotecan.  To help prevent nausea and vomiting after your treatment, we encourage you to take your nausea medication as needed.   If you develop nausea and vomiting that is not controlled by your nausea medication, call the clinic.   BELOW ARE SYMPTOMS THAT SHOULD BE REPORTED IMMEDIATELY:  *FEVER GREATER THAN 100.5 F  *CHILLS WITH OR WITHOUT FEVER  NAUSEA AND VOMITING THAT IS NOT CONTROLLED WITH YOUR NAUSEA MEDICATION  *UNUSUAL SHORTNESS OF BREATH  *UNUSUAL BRUISING OR BLEEDING  TENDERNESS IN MOUTH AND THROAT WITH OR WITHOUT PRESENCE OF ULCERS  *URINARY PROBLEMS  *BOWEL PROBLEMS  UNUSUAL RASH Items with * indicate a potential emergency and should be followed up as soon as possible.  Feel free to call the clinic you have any questions or concerns. The clinic phone number is [phone].

## 2012-11-29 ENCOUNTER — Other Ambulatory Visit: Payer: Self-pay | Admitting: Certified Registered Nurse Anesthetist

## 2012-12-04 ENCOUNTER — Telehealth: Payer: Self-pay | Admitting: *Deleted

## 2012-12-04 ENCOUNTER — Ambulatory Visit (HOSPITAL_BASED_OUTPATIENT_CLINIC_OR_DEPARTMENT_OTHER): Payer: Medicare Other | Admitting: Adult Health

## 2012-12-04 ENCOUNTER — Other Ambulatory Visit (HOSPITAL_BASED_OUTPATIENT_CLINIC_OR_DEPARTMENT_OTHER): Payer: Medicare Other

## 2012-12-04 ENCOUNTER — Encounter: Payer: Self-pay | Admitting: Adult Health

## 2012-12-04 ENCOUNTER — Ambulatory Visit (HOSPITAL_BASED_OUTPATIENT_CLINIC_OR_DEPARTMENT_OTHER): Payer: Medicare Other

## 2012-12-04 VITALS — BP 115/74 | HR 73 | Temp 97.8°F | Resp 20 | Ht 66.0 in | Wt 160.3 lb

## 2012-12-04 DIAGNOSIS — C569 Malignant neoplasm of unspecified ovary: Secondary | ICD-10-CM

## 2012-12-04 DIAGNOSIS — D0590 Unspecified type of carcinoma in situ of unspecified breast: Secondary | ICD-10-CM

## 2012-12-04 DIAGNOSIS — C786 Secondary malignant neoplasm of retroperitoneum and peritoneum: Secondary | ICD-10-CM

## 2012-12-04 DIAGNOSIS — F341 Dysthymic disorder: Secondary | ICD-10-CM

## 2012-12-04 DIAGNOSIS — R0602 Shortness of breath: Secondary | ICD-10-CM

## 2012-12-04 DIAGNOSIS — Z5111 Encounter for antineoplastic chemotherapy: Secondary | ICD-10-CM

## 2012-12-04 LAB — COMPREHENSIVE METABOLIC PANEL (CC13)
ALT: 24 U/L (ref 0–55)
AST: 18 U/L (ref 5–34)
Albumin: 3 g/dL — ABNORMAL LOW (ref 3.5–5.0)
Alkaline Phosphatase: 79 U/L (ref 40–150)
BUN: 15 mg/dL (ref 7.0–26.0)
CO2: 24 mEq/L (ref 22–29)
Chloride: 108 mEq/L (ref 98–109)
Creatinine: 0.7 mg/dL (ref 0.6–1.1)
Total Bilirubin: 0.25 mg/dL (ref 0.20–1.20)

## 2012-12-04 LAB — CBC WITH DIFFERENTIAL/PLATELET
BASO%: 1 % (ref 0.0–2.0)
Basophils Absolute: 0 10*3/uL (ref 0.0–0.1)
EOS%: 1.5 % (ref 0.0–7.0)
Eosinophils Absolute: 0.1 10*3/uL (ref 0.0–0.5)
HCT: 32.7 % — ABNORMAL LOW (ref 34.8–46.6)
HGB: 10.1 g/dL — ABNORMAL LOW (ref 11.6–15.9)
LYMPH%: 19.3 % (ref 14.0–49.7)
MONO#: 0.3 10*3/uL (ref 0.1–0.9)
NEUT#: 2.9 10*3/uL (ref 1.5–6.5)
NEUT%: 71.4 % (ref 38.4–76.8)
RDW: 22 % — ABNORMAL HIGH (ref 11.2–14.5)
WBC: 4 10*3/uL (ref 3.9–10.3)
lymph#: 0.8 10*3/uL — ABNORMAL LOW (ref 0.9–3.3)

## 2012-12-04 LAB — TECHNOLOGIST REVIEW

## 2012-12-04 MED ORDER — TOPOTECAN HCL CHEMO INJECTION 4 MG
4.0000 mg/m2 | Freq: Once | INTRAVENOUS | Status: AC
  Administered 2012-12-04: 7 mg via INTRAVENOUS
  Filled 2012-12-04: qty 7

## 2012-12-04 MED ORDER — SODIUM CHLORIDE 0.9 % IV SOLN
Freq: Once | INTRAVENOUS | Status: AC
  Administered 2012-12-04: 11:00:00 via INTRAVENOUS

## 2012-12-04 MED ORDER — ONDANSETRON 8 MG/NS 50 ML IVPB
INTRAVENOUS | Status: AC
  Filled 2012-12-04: qty 8

## 2012-12-04 MED ORDER — DEXAMETHASONE SODIUM PHOSPHATE 10 MG/ML IJ SOLN
10.0000 mg | Freq: Once | INTRAMUSCULAR | Status: AC
  Administered 2012-12-04: 10 mg via INTRAVENOUS

## 2012-12-04 MED ORDER — SODIUM CHLORIDE 0.9 % IJ SOLN
10.0000 mL | INTRAMUSCULAR | Status: DC | PRN
  Administered 2012-12-04: 10 mL
  Filled 2012-12-04: qty 10

## 2012-12-04 MED ORDER — ONDANSETRON 8 MG/50ML IVPB (CHCC)
8.0000 mg | Freq: Once | INTRAVENOUS | Status: AC
  Administered 2012-12-04: 8 mg via INTRAVENOUS

## 2012-12-04 MED ORDER — DEXAMETHASONE SODIUM PHOSPHATE 10 MG/ML IJ SOLN
INTRAMUSCULAR | Status: AC
  Filled 2012-12-04: qty 1

## 2012-12-04 MED ORDER — HEPARIN SOD (PORK) LOCK FLUSH 100 UNIT/ML IV SOLN
500.0000 [IU] | Freq: Once | INTRAVENOUS | Status: AC | PRN
  Administered 2012-12-04: 500 [IU]
  Filled 2012-12-04: qty 5

## 2012-12-04 NOTE — Telephone Encounter (Signed)
Per staff message and POF I have scheduled appts.  JMW  

## 2012-12-04 NOTE — Telephone Encounter (Signed)
appts made and printed. Pt is aware that tx will be added. i emailed MW to add the tx's...td 

## 2012-12-04 NOTE — Progress Notes (Signed)
[facility]  [phone] [phone]  OFFICE PROGRESS NOTE   PCP: Elesa Massed, M.D. SU: Emelia Loron, M.D. RAD ONC: Antony Blackbird, M.D.   DIAGNOSIS: [name] [name] is a 67 year-old female with a history of ductal carcinoma in situ of the left breast diagnosed 04/2009 and ovarian cancer diagnosed in 12/2011.   PRIOR THERAPY: #1. 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 Patient had 3 sentinel biopsied all of them were negative for metastatic disease. Patient underwent radiation therapy between 06/05/2009 through 07/02/2009. She was then begun on Tamoxifen 20 mg daily in 09/2009.  #2  In 12/2011 she received the diagnosis of gynecologic malignancy presenting with abdominal mass and pleural effusions. Patient was originally hospitalized with shortness of breath in 12/2011.  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 She also had malignant ascites.  She underwent bilateral thoracentesis and paracentesis procedures during her hospitalization. During her hospitalization she was seen by gynecologic oncology.  #3 Patient began neoadjuvant chemotherapy consisting of Taxol and Carboplatinum. Her first cycle was administered during her hospitalization. She has completed a total of 6 cycles of Taxol/Carboplatinum from 01/07/2012 - 06/23/2012. Overall she tolerated it well except for some fatigue and neuropathies.  #4 Patient is status post laparotomy in 05/2012 that revealed significant residual disease.   #5  Patient began adjuvant therapy with Topotecan/Avastin beginning on 07/24/2012.   CURRENT THERAPY: Topotecan/Avastin   INTERVAL HISTORY:  BreannaAirlie M. [name] [name] 67 y.o. female returns today for followup of ovarian cancer.  She is here prior to receiving cycle 5 day 15 of Topotecan.  She is feeling well  today.  She denies fevers, chills, nausea, vomiting, constipation, diarrhea, numbness, easy bruising, easy bleeding, oliguria, dysuria or any other concerns.     MEDICAL HISTORY: Past Medical History  Diagnosis Date  . Interstitial cystitis     on chronic antibiotics  . Hyperlipidemia   . Frequent UTI     on prophylaxis  . 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 CAD (coronary artery disease)     a. s/p CABG 7/13;   b.  LHC 12/01/11:  pLAD 70%, mLAD 40%, CFX 40-50% prior to takeoff of the OM2, oRCA occluded, mid vessel filled via R->R collaterals and distal vessel filled by L->R collaterals, S-OM1/OM2 (small and diffusely dz) with mid 90% stenosis, 90% at OM1 anastomotic site, continuation of OM2 occluded, S-Dx patent, S-PDA occluded, L-LAD ok, EF 55-65%  => Med Rx rec.  [name] Hx of echocardiogram     Echo 5/13: EF 60-65%, grade 2 diastolic dysfunction  . Hypercholesterolemia   . Pleural effusion 12/28/11    s/p pleurx catheter  . GERD (gastroesophageal reflux disease)   . Arthritis     "just a little; lower back" (12/28/11)  . Gout attack 08/2011    related to "stress post OHS"  . Depression   . Breast cancer     "left"; on Tamoxifen  . S/P thoracentesis 12/28/11    "for pleural effusion" (12/28/2011)  . Ovarian cancer   . Tachycardia   . Neuromuscular disorder     peripheral neuropathy    ALLERGIES:  is allergic to codeine; isosorbide; other; phenothiazines; talwin; yellow jacket venom; ambien; and tegaderm ag mesh.  MEDICATIONS:  Current Outpatient Prescriptions  Medication Sig Dispense Refill  . ALPRAZolam (XANAX) 0.25 MG tablet Take 1 tablet (0.25 mg  total) by mouth every 8 (eight) hours as needed for anxiety.  30 tablet  4  . Alum & Mag Hydroxide-Simeth (MAGIC MOUTHWASH) SOLN Take 5 mLs by mouth 4 (four) times daily.       [name] aspirin 325 MG tablet Take 325 mg by mouth at bedtime.       [name] atorvastatin (LIPITOR) 20 MG tablet TAKE ONE TABLET BY MOUTH ONCE DAILY  90 tablet  3  .  azithromycin (ZITHROMAX Z-PAK) 250 MG tablet 2 tabs po x day 1, then one tab daily until complete  6 each  0  . carvedilol (COREG) 25 MG tablet Take 1 tablet (25 mg total) by mouth 2 (two) times daily.  180 tablet  3  . dexamethasone (DECADRON) 4 MG tablet Take 2 tablets (8 mg total) by mouth 2 (two) times daily with a meal. Take daily starting the day after chemotherapy for 2 days. Take with food.  30 tablet  1  . docusate sodium (COLACE) 100 MG capsule Take 200 mg by mouth daily.      [name] EPINEPHrine (EPIPEN 2-PAK) 0.3 mg/0.3 mL DEVI Inject 0.3 mg into the muscle daily as needed (allergic reaction).       . famotidine (PEPCID) 20 MG tablet Take 20 mg by mouth. Take 1 tablet at bedtime by mouth      . fluticasone (FLONASE) 50 MCG/ACT nasal spray Place 2 sprays into the nose daily as needed for allergies.      [name] gabapentin (NEURONTIN) 100 MG capsule Take 100 mg orally three times a day  90 capsule  6  . HYDROcodone-acetaminophen (NORCO/VICODIN) 5-325 MG per tablet Take 1-2 tablets by mouth every 4 (four) hours as needed.  40 tablet  2  . lidocaine-prilocaine (EMLA) cream Apply topically as needed.  30 g  1  . loratadine (CLARITIN) 10 MG tablet Take 10 mg by mouth. Take 1 tablet daily as needed by mouth      . nitroGLYCERIN (NITROSTAT) 0.4 MG SL tablet Place 0.4 mg under the tongue every 5 (five) minutes as needed. Chest pain      . omeprazole (PRILOSEC) 20 MG capsule Take 40 mg by mouth daily.      . ondansetron (ZOFRAN) 8 MG tablet Take 1 tablet (8 mg total) by mouth 2 (two) times daily. Take two times a day starting the day after chemo for 2 days. Then take two times a day as needed for nausea or vomiting.  30 tablet  1  . oxybutynin (DITROPAN-XL) 10 MG 24 hr tablet Take 10 mg by mouth daily.      Bertram Gala Glycol-Propyl Glycol 0.4-0.3 % SOLN Place 1 drop into both eyes every morning. As needed      . prochlorperazine (COMPAZINE) 25 MG suppository Place 1 suppository (25 mg total) rectally every 12  (twelve) hours as needed for nausea.  12 suppository  3  . senna (SENOKOT) 8.6 MG tablet Take 1 tablet by mouth daily as needed for constipation.      . SF 5000 PLUS 1.1 % CREA dental cream Place 1.1 % onto teeth at bedtime.       [name] trimethoprim (TRIMPEX) 100 MG tablet Take 100 mg by mouth every morning.       . valACYclovir (VALTREX) 500 MG tablet Take 1 tablet (500 mg total) by mouth daily.  30 tablet  7  . venlafaxine XR (EFFEXOR-XR) 75 MG 24 hr capsule Take 75 mg  Orally daily  30 capsule  8   No current facility-administered medications for this visit.   Facility-Administered Medications Ordered in Other Visits  Medication Dose Route Frequency Provider Last Rate Last Dose  . influenza  inactive virus vaccine (FLUZONE/FLUARIX) injection 0.5 mL  0.5 mL Intramuscular Once Joselyn Arrow, MD        SURGICAL HISTORY:  Past Surgical History  Procedure Laterality Date  . Breast lumpectomy  04/2009    left  . Cesarean section  1973; 1976  . Muscle release  1960    L neck and chest.; "when I was 13; pneumonia settled in my left neck"  . Coronary artery bypass graft  08/18/2011    Procedure: CORONARY ARTERY BYPASS GRAFTING (CABG);  Surgeon: Alleen Borne, MD;  Location: [facility] OR;  Service: Open Heart Surgery;  Laterality: N/A;  Coronary Artery Bypass Graft times five utilizing the left internal mammary artery and the left greater saphenous vein harvested endoscopically.  . Abdominal hysterectomy  1976  . Appendectomy  1976  . Portacath placement  01/06/2012    Procedure: INSERTION PORT-A-CATH;  Surgeon: Alleen Borne, MD;  Location: [facility] OR;  Service: Thoracic;  Laterality: Left;  . Chest tube insertion  01/06/2012    Procedure: INSERTION PLEURAL DRAINAGE CATHETER;  Surgeon: Alleen Borne, MD;  Location: [facility] OR;  Service: Thoracic;  Laterality: Bilateral;  . Removal of pleural drainage catheter Right 04/12/2012    Procedure: MINOR REMOVAL OF PLEURAL DRAINAGE CATHETER;  Surgeon: Alleen Borne, MD;   Location: [facility] OR;  Service: Thoracic;  Laterality: Right;  . Talc pleurodesis Left 04/12/2012    Procedure: Lurlean Nanny;  Surgeon: Alleen Borne, MD;  Location: [facility] OR;  Service: Thoracic;  Laterality: Left;  . Portacath placement Left 04/17/2012    Procedure: INSERTION PORT-A-CATH;  Surgeon: Alleen Borne, MD;  Location: [facility] OR;  Service: Thoracic;  Laterality: Left;  . Removal of pleural drainage catheter Left 04/17/2012    Procedure: REMOVAL OF PLEURAL DRAINAGE CATHETER;  Surgeon: Alleen Borne, MD;  Location: [facility] OR;  Service: Thoracic;  Laterality: Left;  . Port-a-cath removal Left 04/17/2012    Procedure: REMOVAL PORT-A-CATH;  Surgeon: Alleen Borne, MD;  Location: [facility] OR;  Service: Thoracic;  Laterality: Left;  . Cardiac catheterization    . Laparotomy Bilateral 05/30/2012    Procedure: Resection of umbilical mass, Partial omentectomy;  Surgeon: Jeannette Corpus, MD;  Location: [facility] ORS;  Service: Gynecology;  Laterality: Bilateral;    REVIEW OF SYSTEMS:   A 10 point review of systems was completed and is negative except as noted above.  PHYSICAL EXAMINATION:  BP 115/74  Pulse 73  Temp(Src) 97.8 F (36.6 C) (Oral)  Resp 20  Ht 5\' 6"  (1.676 m)  Wt 160 lb 4.8 oz (72.712 kg)  BMI 25.89 kg/m2  General: Patient is a well appearing female in no acute distress HEENT: PERRLA, sclerae anicteric, conjunctival pallor, MMM Neck: supple, no palpable adenopathy Lungs: clear to auscultation bilaterally, no wheezes, rhonchi, or rales Cardiovascular: regular rate rhythm, S1, S2, no murmurs, rubs or gallops Abdomen: Soft, non-tender, non-distended, normoactive bowel sounds, no HSM Extremities: warm and well perfused, no clubbing, cyanosis, or edema Skin: No rashes or lesions, pale Neuro: Non-focal ECOG PERFORMANCE STATUS: 1 - Symptomatic but completely ambulatory.   LABORATORY DATA: Lab Results  Component Value Date   WBC 4.0 12/04/2012   HGB 10.1* 12/04/2012   HCT 32.7* 12/04/2012    MCV 95.6 12/04/2012   PLT 126* 12/04/2012  Chemistry      Component Value Date/Time   NA 141 11/27/2012 0853   NA 134* 06/02/2012 0515   NA 141 12/17/2011 1144   K 4.4 11/27/2012 0853   K 4.4 06/02/2012 0515   CL 103 08/07/2012 1108   CL 100 06/02/2012 0515   CO2 24 11/27/2012 0853   CO2 25 06/02/2012 0515   BUN 17.3 11/27/2012 0853   BUN 20 06/02/2012 0515   BUN 15 12/17/2011 1144   CREATININE 0.8 11/27/2012 0853   CREATININE 0.76 06/02/2012 0515      Component Value Date/Time   CALCIUM 9.6 11/27/2012 0853   CALCIUM 8.7 06/02/2012 0515   ALKPHOS 74 11/27/2012 0853   ALKPHOS 85 05/26/2012 0955   AST 23 11/27/2012 0853   AST 22 05/26/2012 0955   ALT 29 11/27/2012 0853   ALT 16 05/26/2012 0955   BILITOT 0.36 11/27/2012 0853   BILITOT 0.3 05/26/2012 0955       RADIOGRAPHIC STUDIES: Dg Chest 2 View 08/30/2012   *RADIOLOGY REPORT*  Clinical Data: Shortness of breath, history metastatic ovarian carcinoma and history of bilateral pleural effusions.  CHEST - 2 VIEW  Comparison: 05/26/2012  Findings: There is significant diminishment in bilateral pleural effusions since the prior study with only trace amount of pleural fluid present bilaterally.  The lungs show no evidence of focal consolidation, masses or pulmonary edema.  Port-A-Cath positioning stable in the SVC.  The heart size is normal.  No bony lesions are seen.  IMPRESSION: Significant diminishment in bilateral pleural effusions since the prior chest x-ray. Trace bilateral pleural effusions remain.  No acute findings.   Original Report Authenticated By: Irish Lack, M.D.     ASSESSMENT: 67 year old woman:   #1 History of DCIS of the left breast status post lumpectomy in 04/2009.  Status post radiation therapy completed in 06/2009.  Was on antiestrogen therapy with Tamoxifen 20 mg by mouth daily that was started in 09/2009.  Tamoxifen has been discontinued for now due to her recent diagnosis of ovarian cancer.  #2 Gynecologic  malignancy (ovarian carcinoma) diagnosed in 12/2011. Patient received 6 cycles of neoadjuvant chemotherapy consisting of Taxol/Carboplatinum completed in 06/2012.  #3 She is status post laparotomy performed on 05/30/2012. Unfortunately, intraoperatively she was found to have significant residual disease. 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   #4 Patient completed chemotherapy sensitivity testing. Unfortunately the results were inconclusive.  It was decided to proceed with chemotherapy consisting of Topotecan and Avastin.  Dr. Grant Ruts also recommended this treatment regimen.  #5 Patient was started chemotherapy consisting of Topotecan and Avastin  With Topotecan q week on day 1, 8,15 on a 28 day cycle, and Avastin scheduled for every 3 weeks.  #6 Depression/anxiety: Currently on Effexor with good response  #7 Shortness of breath: patient has been seen by cardilogist Dr. Elease Hashimoto and cardiac etiology was ruled out. Patient followed by Dr. Sherene Sires, last appt on 11/23/12 and he recommended a re-conditioning and exercise plan along with dietary modifications.   #8 History of dehydration -patient instructed to drink plenty of water  #9  Cold sores/herpes labialis.  #10 Neuropathy - currently on Gabapentin 100 mg by mouth 3 times a day.  Neuropathy is currently resolved with this.     PLAN: #1  Doing well.  Patient will proceed with chemotherapy today.  I reviewed her labs with her in detail.    #2 [name] [name]'s  breathing and shortness of breath has improved.  She continues to walk, and is improving with her breathing.  She also continues with the dietary modifications recommended by Dr. Sherene Sires.    #3 She will return next week for labs and an appointment for evaluation.  She has f/u with Dr. Duard Brady on 12/21/12.  All questions were answered.  Patient was encouraged to contact us with any questions, problems or  concerns.   I spent 25 minutes counseling the patient face to face.  The total time spent in the appointment was 30 minutes.  Illa Level, NP Medical Oncology [facility] [phone] 12/04/2012  9:32 AM

## 2012-12-04 NOTE — Patient Instructions (Signed)
[facility] Discharge Instructions for Patients Receiving Chemotherapy  Today you received the following chemotherapy agents Topotecan.  To help prevent nausea and vomiting after your treatment, we encourage you to take your nausea medication as prescribed.   If you develop nausea and vomiting that is not controlled by your nausea medication, call the clinic.   BELOW ARE SYMPTOMS THAT SHOULD BE REPORTED IMMEDIATELY:  *FEVER GREATER THAN 100.5 F  *CHILLS WITH OR WITHOUT FEVER  NAUSEA AND VOMITING THAT IS NOT CONTROLLED WITH YOUR NAUSEA MEDICATION  *UNUSUAL SHORTNESS OF BREATH  *UNUSUAL BRUISING OR BLEEDING  TENDERNESS IN MOUTH AND THROAT WITH OR WITHOUT PRESENCE OF ULCERS  *URINARY PROBLEMS  *BOWEL PROBLEMS  UNUSUAL RASH Items with * indicate a potential emergency and should be followed up as soon as possible.  Feel free to call the clinic you have any questions or concerns. The clinic phone number is [phone].    

## 2012-12-04 NOTE — Patient Instructions (Signed)
Doing well.  Proceed with treatment today.  Please call us if you have any questions or concerns.

## 2012-12-11 ENCOUNTER — Other Ambulatory Visit (HOSPITAL_BASED_OUTPATIENT_CLINIC_OR_DEPARTMENT_OTHER): Payer: Medicare Other | Admitting: Lab

## 2012-12-11 ENCOUNTER — Encounter: Payer: Self-pay | Admitting: Adult Health

## 2012-12-11 ENCOUNTER — Ambulatory Visit (HOSPITAL_BASED_OUTPATIENT_CLINIC_OR_DEPARTMENT_OTHER): Payer: Medicare Other | Admitting: Adult Health

## 2012-12-11 ENCOUNTER — Ambulatory Visit (HOSPITAL_BASED_OUTPATIENT_CLINIC_OR_DEPARTMENT_OTHER): Payer: Medicare Other

## 2012-12-11 VITALS — BP 134/84

## 2012-12-11 VITALS — BP 123/72 | HR 87 | Temp 97.4°F | Resp 20 | Ht 66.0 in | Wt 159.6 lb

## 2012-12-11 DIAGNOSIS — C569 Malignant neoplasm of unspecified ovary: Secondary | ICD-10-CM

## 2012-12-11 DIAGNOSIS — C801 Malignant (primary) neoplasm, unspecified: Secondary | ICD-10-CM

## 2012-12-11 DIAGNOSIS — Z5112 Encounter for antineoplastic immunotherapy: Secondary | ICD-10-CM

## 2012-12-11 DIAGNOSIS — G609 Hereditary and idiopathic neuropathy, unspecified: Secondary | ICD-10-CM

## 2012-12-11 DIAGNOSIS — Z853 Personal history of malignant neoplasm of breast: Secondary | ICD-10-CM

## 2012-12-11 DIAGNOSIS — C786 Secondary malignant neoplasm of retroperitoneum and peritoneum: Secondary | ICD-10-CM

## 2012-12-11 DIAGNOSIS — F411 Generalized anxiety disorder: Secondary | ICD-10-CM

## 2012-12-11 DIAGNOSIS — D0590 Unspecified type of carcinoma in situ of unspecified breast: Secondary | ICD-10-CM

## 2012-12-11 LAB — COMPREHENSIVE METABOLIC PANEL (CC13)
ALT: 30 U/L (ref 0–55)
Albumin: 3.1 g/dL — ABNORMAL LOW (ref 3.5–5.0)
Alkaline Phosphatase: 81 U/L (ref 40–150)
BUN: 15.6 mg/dL (ref 7.0–26.0)
Chloride: 108 mEq/L (ref 98–109)
Glucose: 117 mg/dl (ref 70–140)
Potassium: 4 mEq/L (ref 3.5–5.1)
Sodium: 141 mEq/L (ref 136–145)
Total Protein: 6.3 g/dL — ABNORMAL LOW (ref 6.4–8.3)

## 2012-12-11 LAB — CBC WITH DIFFERENTIAL/PLATELET
Basophils Absolute: 0 10*3/uL (ref 0.0–0.1)
EOS%: 2 % (ref 0.0–7.0)
Eosinophils Absolute: 0.1 10*3/uL (ref 0.0–0.5)
HGB: 9.9 g/dL — ABNORMAL LOW (ref 11.6–15.9)
MCH: 29.7 pg (ref 25.1–34.0)
NEUT#: 1.3 10*3/uL — ABNORMAL LOW (ref 1.5–6.5)
RDW: 22 % — ABNORMAL HIGH (ref 11.2–14.5)
WBC: 2.5 10*3/uL — ABNORMAL LOW (ref 3.9–10.3)
lymph#: 0.9 10*3/uL (ref 0.9–3.3)

## 2012-12-11 LAB — UA PROTEIN, DIPSTICK - CHCC: Protein, ur: NEGATIVE mg/dL

## 2012-12-11 MED ORDER — SODIUM CHLORIDE 0.9 % IV SOLN
Freq: Once | INTRAVENOUS | Status: AC
  Administered 2012-12-11: 16:00:00 via INTRAVENOUS

## 2012-12-11 MED ORDER — HEPARIN SOD (PORK) LOCK FLUSH 100 UNIT/ML IV SOLN
500.0000 [IU] | Freq: Once | INTRAVENOUS | Status: AC | PRN
  Administered 2012-12-11: 500 [IU]
  Filled 2012-12-11: qty 5

## 2012-12-11 MED ORDER — SODIUM CHLORIDE 0.9 % IJ SOLN
10.0000 mL | INTRAMUSCULAR | Status: DC | PRN
  Administered 2012-12-11: 10 mL
  Filled 2012-12-11: qty 10

## 2012-12-11 MED ORDER — SODIUM CHLORIDE 0.9 % IV SOLN
15.0000 mg/kg | Freq: Once | INTRAVENOUS | Status: AC
  Administered 2012-12-11: 1025 mg via INTRAVENOUS
  Filled 2012-12-11: qty 41

## 2012-12-11 NOTE — Progress Notes (Addendum)
[facility]  [phone] [phone]  OFFICE PROGRESS NOTE   PCP: Elesa Massed, M.D. SU: Emelia Loron, M.D. RAD ONC: Antony Blackbird, M.D.   DIAGNOSIS: [name] [name] is a 67 year-old female with a history of ductal carcinoma in situ of the left breast diagnosed 04/2009 and ovarian cancer diagnosed in 12/2011.   PRIOR THERAPY: #1. 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 Patient had 3 sentinel biopsied all of them were negative for metastatic disease. Patient underwent radiation therapy between 06/05/2009 through 07/02/2009. She was then begun on Tamoxifen 20 mg daily in 09/2009.  #2  In 12/2011 she received the diagnosis of gynecologic malignancy presenting with abdominal mass and pleural effusions. Patient was originally hospitalized with shortness of breath in 12/2011.  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 She also had malignant ascites.  She underwent bilateral thoracentesis and paracentesis procedures during her hospitalization. During her hospitalization she was seen by gynecologic oncology.  #3 Patient began neoadjuvant chemotherapy consisting of Taxol and Carboplatinum. Her first cycle was administered during her hospitalization. She has completed a total of 6 cycles of Taxol/Carboplatinum from 01/07/2012 - 06/23/2012. Overall she tolerated it well except for some fatigue and neuropathies.  #4 Patient is status post laparotomy in 05/2012 that revealed significant residual disease.   #5  Patient began adjuvant therapy with Topotecan/Avastin beginning on 07/24/2012.   CURRENT THERAPY: Topotecan/Avastin   INTERVAL HISTORY:  [name] [name] 67 y.o. female returns today for followup of ovarian cancer.  She is doing well today.  She is here for Avastin therapy.  She is having difficulty  eating.  She experiences pain with eating in her mouth and pain and aches on the bridge of her nose.  Otherwise, she denies fevers, chills, nausesa, vomiting, constipation, diarrhea, numbness or further concerns.    MEDICAL HISTORY: Past Medical History  Diagnosis Date  . Interstitial cystitis     on chronic antibiotics  . Hyperlipidemia   . Frequent UTI     on prophylaxis  . 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 CAD (coronary artery disease)     a. s/p CABG 7/13;   b.  LHC 12/01/11:  pLAD 70%, mLAD 40%, CFX 40-50% prior to takeoff of the OM2, oRCA occluded, mid vessel filled via R->R collaterals and distal vessel filled by L->R collaterals, S-OM1/OM2 (small and diffusely dz) with mid 90% stenosis, 90% at OM1 anastomotic site, continuation of OM2 occluded, S-Dx patent, S-PDA occluded, L-LAD ok, EF 55-65%  => Med Rx rec.  [name] Hx of echocardiogram     Echo 5/13: EF 60-65%, grade 2 diastolic dysfunction  . Hypercholesterolemia   . Pleural effusion 12/28/11    s/p pleurx catheter  . GERD (gastroesophageal reflux disease)   . Arthritis     "just a little; lower back" (12/28/11)  . Gout attack 08/2011    related to "stress post OHS"  . Depression   . Breast cancer     "left"; on Tamoxifen  . S/P thoracentesis 12/28/11    "for pleural effusion" (12/28/2011)  . Ovarian cancer   . Tachycardia   . Neuromuscular disorder     peripheral neuropathy    ALLERGIES:  is allergic to codeine; isosorbide; other; phenothiazines; talwin; yellow jacket venom; ambien; and tegaderm ag mesh.  MEDICATIONS:  Current Outpatient Prescriptions  Medication Sig Dispense Refill  .  ALPRAZolam (XANAX) 0.25 MG tablet Take 1 tablet (0.25 mg total) by mouth every 8 (eight) hours as needed for anxiety.  30 tablet  4  . Alum & Mag Hydroxide-Simeth (MAGIC MOUTHWASH) SOLN Take 5 mLs by mouth 4 (four) times daily.       [name] aspirin 325 MG tablet Take 325 mg by mouth at bedtime.       [name] atorvastatin (LIPITOR) 20 MG tablet TAKE ONE  TABLET BY MOUTH ONCE DAILY  90 tablet  3  . carvedilol (COREG) 25 MG tablet Take 1 tablet (25 mg total) by mouth 2 (two) times daily.  180 tablet  3  . dexamethasone (DECADRON) 4 MG tablet Take 2 tablets (8 mg total) by mouth 2 (two) times daily with a meal. Take daily starting the day after chemotherapy for 2 days. Take with food.  30 tablet  1  . docusate sodium (COLACE) 100 MG capsule Take 200 mg by mouth daily.      . famotidine (PEPCID) 20 MG tablet Take 20 mg by mouth. Take 1 tablet at bedtime by mouth      . fluticasone (FLONASE) 50 MCG/ACT nasal spray Place 2 sprays into the nose daily as needed for allergies.      [name] gabapentin (NEURONTIN) 100 MG capsule Take 100 mg orally three times a day  90 capsule  6  . HYDROcodone-acetaminophen (NORCO/VICODIN) 5-325 MG per tablet Take 1-2 tablets by mouth every 4 (four) hours as needed.  40 tablet  2  . lidocaine-prilocaine (EMLA) cream Apply topically as needed.  30 g  1  . loratadine (CLARITIN) 10 MG tablet Take 10 mg by mouth. Take 1 tablet daily as needed by mouth      . omeprazole (PRILOSEC) 20 MG capsule Take 40 mg by mouth daily.      . ondansetron (ZOFRAN) 8 MG tablet Take 1 tablet (8 mg total) by mouth 2 (two) times daily. Take two times a day starting the day after chemo for 2 days. Then take two times a day as needed for nausea or vomiting.  30 tablet  1  . oxybutynin (DITROPAN-XL) 10 MG 24 hr tablet Take 10 mg by mouth daily.      Bertram Gala Glycol-Propyl Glycol 0.4-0.3 % SOLN Place 1 drop into both eyes every morning. As needed      . prochlorperazine (COMPAZINE) 25 MG suppository Place 1 suppository (25 mg total) rectally every 12 (twelve) hours as needed for nausea.  12 suppository  3  . senna (SENOKOT) 8.6 MG tablet Take 1 tablet by mouth daily as needed for constipation.      . SF 5000 PLUS 1.1 % CREA dental cream Place 1.1 % onto teeth at bedtime.       [name] trimethoprim (TRIMPEX) 100 MG tablet Take 100 mg by mouth every morning.        . valACYclovir (VALTREX) 500 MG tablet Take 1 tablet (500 mg total) by mouth daily.  30 tablet  7  . venlafaxine XR (EFFEXOR-XR) 75 MG 24 hr capsule Take 75 mg  Orally daily  30 capsule  8  . EPINEPHrine (EPIPEN 2-PAK) 0.3 mg/0.3 mL DEVI Inject 0.3 mg into the muscle daily as needed (allergic reaction).       . nitroGLYCERIN (NITROSTAT) 0.4 MG SL tablet Place 0.4 mg under the tongue every 5 (five) minutes as needed. Chest pain       No current facility-administered medications for this visit.   Facility-Administered Medications  Ordered in Other Visits  Medication Dose Route Frequency Provider Last Rate Last Dose  . influenza  inactive virus vaccine (FLUZONE/FLUARIX) injection 0.5 mL  0.5 mL Intramuscular Once Joselyn Arrow, MD        SURGICAL HISTORY:  Past Surgical History  Procedure Laterality Date  . Breast lumpectomy  04/2009    left  . Cesarean section  1973; 1976  . Muscle release  1960    L neck and chest.; "when I was 13; pneumonia settled in my left neck"  . Coronary artery bypass graft  08/18/2011    Procedure: CORONARY ARTERY BYPASS GRAFTING (CABG);  Surgeon: Alleen Borne, MD;  Location: [facility] OR;  Service: Open Heart Surgery;  Laterality: N/A;  Coronary Artery Bypass Graft times five utilizing the left internal mammary artery and the left greater saphenous vein harvested endoscopically.  . Abdominal hysterectomy  1976  . Appendectomy  1976  . Portacath placement  01/06/2012    Procedure: INSERTION PORT-A-CATH;  Surgeon: Alleen Borne, MD;  Location: [facility] OR;  Service: Thoracic;  Laterality: Left;  . Chest tube insertion  01/06/2012    Procedure: INSERTION PLEURAL DRAINAGE CATHETER;  Surgeon: Alleen Borne, MD;  Location: [facility] OR;  Service: Thoracic;  Laterality: Bilateral;  . Removal of pleural drainage catheter Right 04/12/2012    Procedure: MINOR REMOVAL OF PLEURAL DRAINAGE CATHETER;  Surgeon: Alleen Borne, MD;  Location: [facility] OR;  Service: Thoracic;  Laterality: Right;  . Talc  pleurodesis Left 04/12/2012    Procedure: Lurlean Nanny;  Surgeon: Alleen Borne, MD;  Location: [facility] OR;  Service: Thoracic;  Laterality: Left;  . Portacath placement Left 04/17/2012    Procedure: INSERTION PORT-A-CATH;  Surgeon: Alleen Borne, MD;  Location: [facility] OR;  Service: Thoracic;  Laterality: Left;  . Removal of pleural drainage catheter Left 04/17/2012    Procedure: REMOVAL OF PLEURAL DRAINAGE CATHETER;  Surgeon: Alleen Borne, MD;  Location: [facility] OR;  Service: Thoracic;  Laterality: Left;  . Port-a-cath removal Left 04/17/2012    Procedure: REMOVAL PORT-A-CATH;  Surgeon: Alleen Borne, MD;  Location: [facility] OR;  Service: Thoracic;  Laterality: Left;  . Cardiac catheterization    . Laparotomy Bilateral 05/30/2012    Procedure: Resection of umbilical mass, Partial omentectomy;  Surgeon: Jeannette Corpus, MD;  Location: [facility] ORS;  Service: Gynecology;  Laterality: Bilateral;    REVIEW OF SYSTEMS:   A 10 point review of systems was completed and is negative except as noted above.  PHYSICAL EXAMINATION:  BP 123/72  Pulse 87  Temp(Src) 97.4 F (36.3 C) (Oral)  Resp 20  Ht 5\' 6"  (1.676 m)  Wt 159 lb 9.6 oz (72.394 kg)  BMI 25.77 kg/m2  General: Patient is a well appearing female in no acute distress HEENT: PERRLA, sclerae anicteric, conjunctival pallor, MMM Neck: supple, no palpable adenopathy Lungs: clear to auscultation bilaterally, no wheezes, rhonchi, or rales Cardiovascular: regular rate rhythm, S1, S2, no murmurs, rubs or gallops Abdomen: Soft, non-tender, non-distended, normoactive bowel sounds, no HSM Extremities: warm and well perfused, no clubbing, cyanosis, or edema Skin: No rashes or lesions, pale Neuro: Non-focal ECOG PERFORMANCE STATUS: 1 - Symptomatic but completely ambulatory.   LABORATORY DATA: Lab Results  Component Value Date   WBC 2.5* 12/11/2012   HGB 9.9* 12/11/2012   HCT 31.5* 12/11/2012   MCV 94.6 12/11/2012   PLT 110* 12/11/2012      Chemistry       Component Value Date/Time  NA 142 12/04/2012 0855   NA 134* 06/02/2012 0515   NA 141 12/17/2011 1144   K 4.2 12/04/2012 0855   K 4.4 06/02/2012 0515   CL 103 08/07/2012 1108   CL 100 06/02/2012 0515   CO2 24 12/04/2012 0855   CO2 25 06/02/2012 0515   BUN 15.0 12/04/2012 0855   BUN 20 06/02/2012 0515   BUN 15 12/17/2011 1144   CREATININE 0.7 12/04/2012 0855   CREATININE 0.76 06/02/2012 0515      Component Value Date/Time   CALCIUM 9.0 12/04/2012 0855   CALCIUM 8.7 06/02/2012 0515   ALKPHOS 79 12/04/2012 0855   ALKPHOS 85 05/26/2012 0955   AST 18 12/04/2012 0855   AST 22 05/26/2012 0955   ALT 24 12/04/2012 0855   ALT 16 05/26/2012 0955   BILITOT 0.25 12/04/2012 0855   BILITOT 0.3 05/26/2012 0955       RADIOGRAPHIC STUDIES: Dg Chest 2 View 08/30/2012   *RADIOLOGY REPORT*  Clinical Data: Shortness of breath, history metastatic ovarian carcinoma and history of bilateral pleural effusions.  CHEST - 2 VIEW  Comparison: 05/26/2012  Findings: There is significant diminishment in bilateral pleural effusions since the prior study with only trace amount of pleural fluid present bilaterally.  The lungs show no evidence of focal consolidation, masses or pulmonary edema.  Port-A-Cath positioning stable in the SVC.  The heart size is normal.  No bony lesions are seen.  IMPRESSION: Significant diminishment in bilateral pleural effusions since the prior chest x-ray. Trace bilateral pleural effusions remain.  No acute findings.   Original Report Authenticated By: Irish Lack, M.D.     ASSESSMENT: 67 year old woman:   #1 History of DCIS of the left breast status post lumpectomy in 04/2009.  Status post radiation therapy completed in 06/2009.  Was on antiestrogen therapy with Tamoxifen 20 mg by mouth daily that was started in 09/2009.  Tamoxifen has been discontinued for now due to her recent diagnosis of ovarian cancer.  #2 Gynecologic malignancy (ovarian carcinoma) diagnosed in 12/2011. Patient  received 6 cycles of neoadjuvant chemotherapy consisting of Taxol/Carboplatinum completed in 06/2012.  #3 She is status post laparotomy performed on 05/30/2012. Unfortunately, intraoperatively she was found to have significant residual disease. 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   #4 Patient completed chemotherapy sensitivity testing. Unfortunately the results were inconclusive.  It was decided to proceed with chemotherapy consisting of Topotecan and Avastin.  Dr. Grant Ruts also recommended this treatment regimen.  #5 Patient was started chemotherapy consisting of Topotecan and Avastin  With Topotecan q week on day 1, 8,15 on a 28 day cycle, and Avastin scheduled for every 3 weeks.  #6 Depression/anxiety: Currently on Effexor with good response  #7 Shortness of breath: patient has been seen by cardilogist Dr. Elease Hashimoto and cardiac etiology was ruled out. Patient followed by Dr. Sherene Sires, last appt on 11/23/12 and he recommended a re-conditioning and exercise plan along with dietary modifications.   #8 History of dehydration -patient instructed to drink plenty of water  #9  Cold sores/herpes labialis.  #10 Neuropathy - currently on Gabapentin 100 mg by mouth 3 times a day.  Neuropathy is currently resolved with this.     PLAN: #1  Doing well.  I reviewed her labs with her in detail, she will proceed with Avastin today.    #2 I recommended she take claritin daily as she likely has seasonal allergies.   #  3 She will continue using magic mouth wash four times per day for her mouth pain.  There are no visible lesions in her mouth.    #4 She will return to [place] next week for labs, an appointment, and Topotecan.    All questions were answered.  Patient was encouraged to contact us with any questions, problems or concerns.   I spent 25 minutes counseling the patient face to face.  The total time spent in the  appointment was 30 minutes.  Illa Level, NP Medical Oncology [facility] [phone] 12/11/2012  2:07 PM   ATTENDING'S ATTESTATION:  I personally reviewed patient's chart, examined patient myself, formulated the treatment plan as followed.    Overall patient is doing well. She and I discussed her recent CT scans. At this time my recommendation is to continue Topotecan as well as Avastin. We discussed the possibility of at some point changing her therapy after she has another CT scan. [name] tolerating the chemotherapy well has minimal side effects. [name] had good palliation of her symptoms with this treatment therefore my recommendation is to continue the same.  Drue Second, MD Medical/Oncology [facility] [phone] (beeper) [phone] (Office)  12/19/2012, 3:12 PM

## 2012-12-11 NOTE — Patient Instructions (Signed)
[facility] Discharge Instructions for Patients Receiving Chemotherapy  Today you received the following chemotherapy agents: avastin   To help prevent nausea and vomiting after your treatment, we encourage you to take your nausea medication.  Take it as often as prescribed.     If you develop nausea and vomiting that is not controlled by your nausea medication, call the clinic. If it is after clinic hours your family physician or the after hours number for the clinic or go to the Emergency Department.   BELOW ARE SYMPTOMS THAT SHOULD BE REPORTED IMMEDIATELY:  *FEVER GREATER THAN 100.5 F  *CHILLS WITH OR WITHOUT FEVER  NAUSEA AND VOMITING THAT IS NOT CONTROLLED WITH YOUR NAUSEA MEDICATION  *UNUSUAL SHORTNESS OF BREATH  *UNUSUAL BRUISING OR BLEEDING  TENDERNESS IN MOUTH AND THROAT WITH OR WITHOUT PRESENCE OF ULCERS  *URINARY PROBLEMS  *BOWEL PROBLEMS  UNUSUAL RASH Items with * indicate a potential emergency and should be followed up as soon as possible.  Feel free to call the clinic you have any questions or concerns. The clinic phone number is [phone].   I have been informed and understand all the instructions given to me. I know to contact the clinic, my physician, or go to the Emergency Department if any problems should occur. I do not have any questions at this time, but understand that I may call the clinic during office hours   should I have any questions or need assistance in obtaining follow up care.    __________________________________________  _____________  __________ Signature of Patient or Authorized Representative            Date                   Time    __________________________________________ Nurse's Signature    

## 2012-12-18 ENCOUNTER — Encounter: Payer: Self-pay | Admitting: Oncology

## 2012-12-18 ENCOUNTER — Ambulatory Visit (HOSPITAL_BASED_OUTPATIENT_CLINIC_OR_DEPARTMENT_OTHER): Payer: Medicare Other | Admitting: Adult Health

## 2012-12-18 ENCOUNTER — Ambulatory Visit (HOSPITAL_BASED_OUTPATIENT_CLINIC_OR_DEPARTMENT_OTHER): Payer: Medicare Other

## 2012-12-18 ENCOUNTER — Encounter: Payer: Self-pay | Admitting: Adult Health

## 2012-12-18 ENCOUNTER — Other Ambulatory Visit (HOSPITAL_BASED_OUTPATIENT_CLINIC_OR_DEPARTMENT_OTHER): Payer: Medicare Other | Admitting: Lab

## 2012-12-18 VITALS — BP 116/72 | HR 80 | Temp 98.1°F | Resp 18 | Ht 66.0 in | Wt 161.3 lb

## 2012-12-18 DIAGNOSIS — F341 Dysthymic disorder: Secondary | ICD-10-CM

## 2012-12-18 DIAGNOSIS — D0590 Unspecified type of carcinoma in situ of unspecified breast: Secondary | ICD-10-CM

## 2012-12-18 DIAGNOSIS — C786 Secondary malignant neoplasm of retroperitoneum and peritoneum: Secondary | ICD-10-CM

## 2012-12-18 DIAGNOSIS — R0602 Shortness of breath: Secondary | ICD-10-CM

## 2012-12-18 DIAGNOSIS — Z5111 Encounter for antineoplastic chemotherapy: Secondary | ICD-10-CM

## 2012-12-18 DIAGNOSIS — C5700 Malignant neoplasm of unspecified fallopian tube: Secondary | ICD-10-CM

## 2012-12-18 DIAGNOSIS — C569 Malignant neoplasm of unspecified ovary: Secondary | ICD-10-CM

## 2012-12-18 DIAGNOSIS — B009 Herpesviral infection, unspecified: Secondary | ICD-10-CM

## 2012-12-18 LAB — COMPREHENSIVE METABOLIC PANEL (CC13)
Albumin: 3.1 g/dL — ABNORMAL LOW (ref 3.5–5.0)
Anion Gap: 10 mEq/L (ref 3–11)
BUN: 16 mg/dL (ref 7.0–26.0)
CO2: 23 mEq/L (ref 22–29)
Calcium: 9.4 mg/dL (ref 8.4–10.4)
Chloride: 108 mEq/L (ref 98–109)
Creatinine: 0.7 mg/dL (ref 0.6–1.1)
Sodium: 141 mEq/L (ref 136–145)
Total Protein: 6.4 g/dL (ref 6.4–8.3)

## 2012-12-18 LAB — CBC WITH DIFFERENTIAL/PLATELET
BASO%: 0.5 % (ref 0.0–2.0)
HCT: 33.4 % — ABNORMAL LOW (ref 34.8–46.6)
LYMPH%: 19.8 % (ref 14.0–49.7)
MCH: 30 pg (ref 25.1–34.0)
MCHC: 30.2 g/dL — ABNORMAL LOW (ref 31.5–36.0)
MCV: 99.1 fL (ref 79.5–101.0)
MONO#: 0.6 10*3/uL (ref 0.1–0.9)
MONO%: 13.2 % (ref 0.0–14.0)
NEUT%: 64.2 % (ref 38.4–76.8)
Platelets: 274 10*3/uL (ref 145–400)
RDW: 24.3 % — ABNORMAL HIGH (ref 11.2–14.5)
WBC: 4.4 10*3/uL (ref 3.9–10.3)
nRBC: 1 % — ABNORMAL HIGH (ref 0–0)

## 2012-12-18 MED ORDER — DEXAMETHASONE SODIUM PHOSPHATE 10 MG/ML IJ SOLN
INTRAMUSCULAR | Status: AC
  Filled 2012-12-18: qty 1

## 2012-12-18 MED ORDER — SODIUM CHLORIDE 0.9 % IV SOLN
Freq: Once | INTRAVENOUS | Status: AC
  Administered 2012-12-18: 11:00:00 via INTRAVENOUS

## 2012-12-18 MED ORDER — SODIUM CHLORIDE 0.9 % IJ SOLN
10.0000 mL | INTRAMUSCULAR | Status: DC | PRN
  Administered 2012-12-18: 10 mL
  Filled 2012-12-18: qty 10

## 2012-12-18 MED ORDER — TOPOTECAN HCL CHEMO INJECTION 4 MG
7.0000 mg | Freq: Once | INTRAVENOUS | Status: AC
  Administered 2012-12-18: 7 mg via INTRAVENOUS
  Filled 2012-12-18: qty 7

## 2012-12-18 MED ORDER — HEPARIN SOD (PORK) LOCK FLUSH 100 UNIT/ML IV SOLN
500.0000 [IU] | Freq: Once | INTRAVENOUS | Status: AC | PRN
  Administered 2012-12-18: 500 [IU]
  Filled 2012-12-18: qty 5

## 2012-12-18 MED ORDER — DEXAMETHASONE SODIUM PHOSPHATE 10 MG/ML IJ SOLN
10.0000 mg | Freq: Once | INTRAMUSCULAR | Status: AC
  Administered 2012-12-18: 10 mg via INTRAVENOUS

## 2012-12-18 MED ORDER — ONDANSETRON 8 MG/NS 50 ML IVPB
INTRAVENOUS | Status: AC
  Filled 2012-12-18: qty 8

## 2012-12-18 MED ORDER — ONDANSETRON 8 MG/50ML IVPB (CHCC)
8.0000 mg | Freq: Once | INTRAVENOUS | Status: AC
  Administered 2012-12-18: 8 mg via INTRAVENOUS

## 2012-12-18 NOTE — Patient Instructions (Signed)
[facility] Discharge Instructions for Patients Receiving Chemotherapy  Today you received the following chemotherapy agents Topotecan.  To help prevent nausea and vomiting after your treatment, we encourage you to take your nausea medication as prescribed.   If you develop nausea and vomiting that is not controlled by your nausea medication, call the clinic.   BELOW ARE SYMPTOMS THAT SHOULD BE REPORTED IMMEDIATELY:  *FEVER GREATER THAN 100.5 F  *CHILLS WITH OR WITHOUT FEVER  NAUSEA AND VOMITING THAT IS NOT CONTROLLED WITH YOUR NAUSEA MEDICATION  *UNUSUAL SHORTNESS OF BREATH  *UNUSUAL BRUISING OR BLEEDING  TENDERNESS IN MOUTH AND THROAT WITH OR WITHOUT PRESENCE OF ULCERS  *URINARY PROBLEMS  *BOWEL PROBLEMS  UNUSUAL RASH Items with * indicate a potential emergency and should be followed up as soon as possible.  Feel free to call the clinic you have any questions or concerns. The clinic phone number is [phone].    

## 2012-12-18 NOTE — Progress Notes (Signed)
[facility]  [phone] [phone]  OFFICE PROGRESS NOTE   PCP: Elesa Massed, M.D. SU: Emelia Loron, M.D. RAD ONC: Antony Blackbird, M.D.   DIAGNOSIS: Mrs. Ancona is a 67 year-old female with a history of ductal carcinoma in situ of the left breast diagnosed 04/2009 and ovarian cancer diagnosed in 12/2011.   PRIOR THERAPY: #1. 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 Patient had 3 sentinel biopsied all of them were negative for metastatic disease. Patient underwent radiation therapy between 06/05/2009 through 07/02/2009. She was then begun on Tamoxifen 20 mg daily in 09/2009.  #2  In 12/2011 she received the diagnosis of gynecologic malignancy presenting with abdominal mass and pleural effusions. Patient was originally hospitalized with shortness of breath in 12/2011.  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 She also had malignant ascites.  She underwent bilateral thoracentesis and paracentesis procedures during her hospitalization. During her hospitalization she was seen by gynecologic oncology.  #3 Patient began neoadjuvant chemotherapy consisting of Taxol and Carboplatinum. Her first cycle was administered during her hospitalization. She has completed a total of 6 cycles of Taxol/Carboplatinum from 01/07/2012 - 06/23/2012. Overall she tolerated it well except for some fatigue and neuropathies.  #4 Patient is status post laparotomy in 05/2012 that revealed significant residual disease.   #5  Patient began adjuvant therapy with Topotecan/Avastin beginning on 07/24/2012.   CURRENT THERAPY: Topotecan/Avastin   INTERVAL HISTORY:  Mrs.Margan M. [name] 67 y.o. female returns today for followup of ovarian cancer.  [name] doing moderately well today.  She continues to have some mild nasal drainage,  and her mouth pain is improved.  She is moderately fatigued, and her shortness of breath continues to improve.  Otherwise, she denies fevers, chills, nausea, vomiting, constipation, diarrhea, numbness, or other concerns.     MEDICAL HISTORY: Past Medical History  Diagnosis Date  . Interstitial cystitis     on chronic antibiotics  . Hyperlipidemia   . Frequent UTI     on prophylaxis  . 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 CAD (coronary artery disease)     a. s/p CABG 7/13;   b.  LHC 12/01/11:  pLAD 70%, mLAD 40%, CFX 40-50% prior to takeoff of the OM2, oRCA occluded, mid vessel filled via R->R collaterals and distal vessel filled by L->R collaterals, S-OM1/OM2 (small and diffusely dz) with mid 90% stenosis, 90% at OM1 anastomotic site, continuation of OM2 occluded, S-Dx patent, S-PDA occluded, L-LAD ok, EF 55-65%  => Med Rx rec.  [name] Hx of echocardiogram     Echo 5/13: EF 60-65%, grade 2 diastolic dysfunction  . Hypercholesterolemia   . Pleural effusion 12/28/11    s/p pleurx catheter  . GERD (gastroesophageal reflux disease)   . Arthritis     "just a little; lower back" (12/28/11)  . Gout attack 08/2011    related to "stress post OHS"  . Depression   . Breast cancer     "left"; on Tamoxifen  . S/P thoracentesis 12/28/11    "for pleural effusion" (12/28/2011)  . Ovarian cancer   . Tachycardia   . Neuromuscular disorder     peripheral neuropathy    ALLERGIES:  is allergic to codeine; isosorbide; other; phenothiazines; talwin; yellow jacket venom; ambien; and tegaderm ag mesh.  MEDICATIONS:  Current Outpatient Prescriptions  Medication Sig Dispense Refill  . ALPRAZolam (  XANAX) 0.25 MG tablet Take 1 tablet (0.25 mg total) by mouth every 8 (eight) hours as needed for anxiety.  30 tablet  4  . Alum & Mag Hydroxide-Simeth (MAGIC MOUTHWASH) SOLN Take 5 mLs by mouth 4 (four) times daily.       [name] aspirin 325 MG tablet Take 325 mg by mouth at bedtime.       [name] atorvastatin (LIPITOR) 20 MG tablet  TAKE ONE TABLET BY MOUTH ONCE DAILY  90 tablet  3  . carvedilol (COREG) 25 MG tablet Take 1 tablet (25 mg total) by mouth 2 (two) times daily.  180 tablet  3  . dexamethasone (DECADRON) 4 MG tablet Take 2 tablets (8 mg total) by mouth 2 (two) times daily with a meal. Take daily starting the day after chemotherapy for 2 days. Take with food.  30 tablet  1  . docusate sodium (COLACE) 100 MG capsule Take 200 mg by mouth daily.      [name] EPINEPHrine (EPIPEN 2-PAK) 0.3 mg/0.3 mL DEVI Inject 0.3 mg into the muscle daily as needed (allergic reaction).       . famotidine (PEPCID) 20 MG tablet Take 20 mg by mouth. Take 1 tablet at bedtime by mouth      . fluticasone (FLONASE) 50 MCG/ACT nasal spray Place 2 sprays into the nose daily as needed for allergies.      [name] gabapentin (NEURONTIN) 100 MG capsule Take 100 mg orally three times a day  90 capsule  6  . HYDROcodone-acetaminophen (NORCO/VICODIN) 5-325 MG per tablet Take 1-2 tablets by mouth every 4 (four) hours as needed.  40 tablet  2  . lidocaine-prilocaine (EMLA) cream Apply topically as needed.  30 g  1  . loratadine (CLARITIN) 10 MG tablet Take 10 mg by mouth. Take 1 tablet daily as needed by mouth      . nitroGLYCERIN (NITROSTAT) 0.4 MG SL tablet Place 0.4 mg under the tongue every 5 (five) minutes as needed. Chest pain      . omeprazole (PRILOSEC) 20 MG capsule Take 40 mg by mouth daily.      . ondansetron (ZOFRAN) 8 MG tablet Take 1 tablet (8 mg total) by mouth 2 (two) times daily. Take two times a day starting the day after chemo for 2 days. Then take two times a day as needed for nausea or vomiting.  30 tablet  1  . oxybutynin (DITROPAN-XL) 10 MG 24 hr tablet Take 10 mg by mouth daily.      Bertram Gala Glycol-Propyl Glycol 0.4-0.3 % SOLN Place 1 drop into both eyes every morning. As needed      . prochlorperazine (COMPAZINE) 25 MG suppository Place 1 suppository (25 mg total) rectally every 12 (twelve) hours as needed for nausea.  12 suppository  3   . senna (SENOKOT) 8.6 MG tablet Take 1 tablet by mouth daily as needed for constipation.      . SF 5000 PLUS 1.1 % CREA dental cream Place 1.1 % onto teeth at bedtime.       [name] trimethoprim (TRIMPEX) 100 MG tablet Take 100 mg by mouth every morning.       . valACYclovir (VALTREX) 500 MG tablet Take 1 tablet (500 mg total) by mouth daily.  30 tablet  7  . venlafaxine XR (EFFEXOR-XR) 75 MG 24 hr capsule Take 75 mg  Orally daily  30 capsule  8   No current facility-administered medications for this visit.   Facility-Administered Medications Ordered  in Other Visits  Medication Dose Route Frequency Provider Last Rate Last Dose  . influenza  inactive virus vaccine (FLUZONE/FLUARIX) injection 0.5 mL  0.5 mL Intramuscular Once Joselyn Arrow, MD        SURGICAL HISTORY:  Past Surgical History  Procedure Laterality Date  . Breast lumpectomy  04/2009    left  . Cesarean section  1973; 1976  . Muscle release  1960    L neck and chest.; "when I was 13; pneumonia settled in my left neck"  . Coronary artery bypass graft  08/18/2011    Procedure: CORONARY ARTERY BYPASS GRAFTING (CABG);  Surgeon: Alleen Borne, MD;  Location: [facility] OR;  Service: Open Heart Surgery;  Laterality: N/A;  Coronary Artery Bypass Graft times five utilizing the left internal mammary artery and the left greater saphenous vein harvested endoscopically.  . Abdominal hysterectomy  1976  . Appendectomy  1976  . Portacath placement  01/06/2012    Procedure: INSERTION PORT-A-CATH;  Surgeon: Alleen Borne, MD;  Location: [facility] OR;  Service: Thoracic;  Laterality: Left;  . Chest tube insertion  01/06/2012    Procedure: INSERTION PLEURAL DRAINAGE CATHETER;  Surgeon: Alleen Borne, MD;  Location: [facility] OR;  Service: Thoracic;  Laterality: Bilateral;  . Removal of pleural drainage catheter Right 04/12/2012    Procedure: MINOR REMOVAL OF PLEURAL DRAINAGE CATHETER;  Surgeon: Alleen Borne, MD;  Location: [facility] OR;  Service: Thoracic;  Laterality: Right;   . Talc pleurodesis Left 04/12/2012    Procedure: Lurlean Nanny;  Surgeon: Alleen Borne, MD;  Location: [facility] OR;  Service: Thoracic;  Laterality: Left;  . Portacath placement Left 04/17/2012    Procedure: INSERTION PORT-A-CATH;  Surgeon: Alleen Borne, MD;  Location: [facility] OR;  Service: Thoracic;  Laterality: Left;  . Removal of pleural drainage catheter Left 04/17/2012    Procedure: REMOVAL OF PLEURAL DRAINAGE CATHETER;  Surgeon: Alleen Borne, MD;  Location: [facility] OR;  Service: Thoracic;  Laterality: Left;  . Port-a-cath removal Left 04/17/2012    Procedure: REMOVAL PORT-A-CATH;  Surgeon: Alleen Borne, MD;  Location: [facility] OR;  Service: Thoracic;  Laterality: Left;  . Cardiac catheterization    . Laparotomy Bilateral 05/30/2012    Procedure: Resection of umbilical mass, Partial omentectomy;  Surgeon: Jeannette Corpus, MD;  Location: [facility] ORS;  Service: Gynecology;  Laterality: Bilateral;    REVIEW OF SYSTEMS:   A 10 point review of systems was completed and is negative except as noted above.  PHYSICAL EXAMINATION:  BP 116/72  Pulse 80  Temp(Src) 98.1 F (36.7 C) (Oral)  Resp 18  Ht 5\' 6"  (1.676 m)  Wt 161 lb 4.8 oz (73.165 kg)  BMI 26.05 kg/m2  General: Patient is a well appearing female in no acute distress HEENT: PERRLA, sclerae anicteric, conjunctival pallor, MMM Neck: supple, no palpable adenopathy Lungs: clear to auscultation bilaterally, no wheezes, rhonchi, or rales Cardiovascular: regular rate rhythm, S1, S2, no murmurs, rubs or gallops Abdomen: Soft, non-tender, non-distended, normoactive bowel sounds, no HSM Extremities: warm and well perfused, no clubbing, cyanosis, or edema Skin: No rashes or lesions Neuro: Non-focal ECOG PERFORMANCE STATUS: 1 - Symptomatic but completely ambulatory.   LABORATORY DATA: Lab Results  Component Value Date   WBC 4.4 12/18/2012   HGB 10.1* 12/18/2012   HCT 33.4* 12/18/2012   MCV 99.1 12/18/2012   PLT 274 12/18/2012      Chemistry       Component Value Date/Time   NA 141  12/18/2012 0951   NA 134* 06/02/2012 0515   NA 141 12/17/2011 1144   K 4.4 12/18/2012 0951   K 4.4 06/02/2012 0515   CL 103 08/07/2012 1108   CL 100 06/02/2012 0515   CO2 23 12/18/2012 0951   CO2 25 06/02/2012 0515   BUN 16.0 12/18/2012 0951   BUN 20 06/02/2012 0515   BUN 15 12/17/2011 1144   CREATININE 0.7 12/18/2012 0951   CREATININE 0.76 06/02/2012 0515      Component Value Date/Time   CALCIUM 9.4 12/18/2012 0951   CALCIUM 8.7 06/02/2012 0515   ALKPHOS 77 12/18/2012 0951   ALKPHOS 85 05/26/2012 0955   AST 23 12/18/2012 0951   AST 22 05/26/2012 0955   ALT 31 12/18/2012 0951   ALT 16 05/26/2012 0955   BILITOT 0.39 12/18/2012 0951   BILITOT 0.3 05/26/2012 0955       RADIOGRAPHIC STUDIES: Dg Chest 2 View 08/30/2012   *RADIOLOGY REPORT*  Clinical Data: Shortness of breath, history metastatic ovarian carcinoma and history of bilateral pleural effusions.  CHEST - 2 VIEW  Comparison: 05/26/2012  Findings: There is significant diminishment in bilateral pleural effusions since the prior study with only trace amount of pleural fluid present bilaterally.  The lungs show no evidence of focal consolidation, masses or pulmonary edema.  Port-A-Cath positioning stable in the SVC.  The heart size is normal.  No bony lesions are seen.  IMPRESSION: Significant diminishment in bilateral pleural effusions since the prior chest x-ray. Trace bilateral pleural effusions remain.  No acute findings.   Original Report Authenticated By: Irish Lack, M.D.     ASSESSMENT: 67 year old woman:   #1 History of DCIS of the left breast status post lumpectomy in 04/2009.  Status post radiation therapy completed in 06/2009.  Was on antiestrogen therapy with Tamoxifen 20 mg by mouth daily that was started in 09/2009.  Tamoxifen has been discontinued for now due to her recent diagnosis of ovarian cancer.  #2 Gynecologic malignancy (ovarian carcinoma) diagnosed in 12/2011. Patient received 6 cycles  of neoadjuvant chemotherapy consisting of Taxol/Carboplatinum completed in 06/2012.  #3 She is status post laparotomy performed on 05/30/2012. Unfortunately, intraoperatively she was found to have significant residual disease. 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   #4 Patient completed chemotherapy sensitivity testing. Unfortunately the results were inconclusive.  It was decided to proceed with chemotherapy consisting of Topotecan and Avastin.  Dr. Grant Ruts also recommended this treatment regimen.  #5 Patient was started chemotherapy consisting of Topotecan and Avastin  With Topotecan q week on day 1, 8,15 on a 28 day cycle, and Avastin scheduled for every 3 weeks.  #6 Depression/anxiety: Currently on Effexor with good response  #7 Shortness of breath: patient has been seen by cardilogist Dr. Elease Hashimoto and cardiac etiology was ruled out. Patient followed by Dr. Sherene Sires, last appt on 11/23/12 and he recommended a re-conditioning and exercise plan along with dietary modifications.   #8 History of dehydration -patient instructed to drink plenty of water  #9  Cold sores/herpes labialis.  #10 Neuropathy - currently on Gabapentin 100 mg by mouth 3 times a day.  Neuropathy is currently resolved with this.     PLAN: #1  Doing well.  I reviewed her labs with her in detail, she will proceed with Topotecan today. Her shortness of breath continues to improve.    #2 She will continue to take claritin daily as she  likely has seasonal allergies.   #3 She is eating soft foods to help with the mouth pain.  It has improved.  She has magic mouthwash prn.    #4 She will return to [place] next week for labs, an appointment, and Topotecan.  I requested more appointments be scheduled through the holidays.    All questions were answered.  Patient was encouraged to contact us with any questions, problems or concerns.   I spent 25  minutes counseling the patient face to face.  The total time spent in the appointment was 30 minutes.  Illa Level, NP Medical Oncology [facility] [phone] 12/19/2012  8:57 AM

## 2012-12-19 ENCOUNTER — Telehealth: Payer: Self-pay | Admitting: Oncology

## 2012-12-19 ENCOUNTER — Telehealth: Payer: Self-pay | Admitting: *Deleted

## 2012-12-19 NOTE — Telephone Encounter (Signed)
Per staff message and POF I have scheduled appts.  JMW  

## 2012-12-19 NOTE — Telephone Encounter (Signed)
, °

## 2012-12-21 ENCOUNTER — Encounter: Payer: Self-pay | Admitting: Gynecologic Oncology

## 2012-12-21 ENCOUNTER — Ambulatory Visit: Payer: Medicare Other | Attending: Gynecologic Oncology | Admitting: Gynecologic Oncology

## 2012-12-21 ENCOUNTER — Ambulatory Visit (HOSPITAL_BASED_OUTPATIENT_CLINIC_OR_DEPARTMENT_OTHER): Payer: Medicare Other | Admitting: Lab

## 2012-12-21 VITALS — BP 122/80 | HR 66 | Temp 98.0°F | Resp 16

## 2012-12-21 DIAGNOSIS — Z853 Personal history of malignant neoplasm of breast: Secondary | ICD-10-CM | POA: Insufficient documentation

## 2012-12-21 DIAGNOSIS — C569 Malignant neoplasm of unspecified ovary: Secondary | ICD-10-CM

## 2012-12-21 DIAGNOSIS — Z90710 Acquired absence of both cervix and uterus: Secondary | ICD-10-CM | POA: Insufficient documentation

## 2012-12-21 DIAGNOSIS — R180 Malignant ascites: Secondary | ICD-10-CM | POA: Insufficient documentation

## 2012-12-21 DIAGNOSIS — Z79899 Other long term (current) drug therapy: Secondary | ICD-10-CM | POA: Insufficient documentation

## 2012-12-21 DIAGNOSIS — I25810 Atherosclerosis of coronary artery bypass graft(s) without angina pectoris: Secondary | ICD-10-CM | POA: Insufficient documentation

## 2012-12-21 NOTE — Progress Notes (Signed)
Consult Note: Gyn-Onc  [name] [name] 67 y.o. female  CC:  Chief Complaint  [name] [name] presents with  . Ovarian Cancer    Follow up     HPI: [name] [name] is a 67 year old Caucasian female with a past medical history of coronary artery disease status post CABG earlier this year, unfortunately following a few months after CABG 2 of her grafts were occluded, history of breast cancer on chronic tamoxifen therapy who presented to the hospital for evaluation of the above noted complaints. Per [name] [name] her shortness of breath and cough had been going on for at least a month if not more. She claimed that her shortness of breath was mostly exertional, and recently it had started to get worse and she could not even walk to the mailbox without stopping multiple times. Unfortunately over the weekend her shortness of breath and cough got significantly worse, she got in touch with her cardiologist today who advised her to come to the emergency room for further evaluation and treatment. In the emergency room she was found to have a large left sided pleural effusion on a chest x-ray. In addition to the shortness of breath she states her several months essentially for the time of her CABG she has had some abdominal discomfort which she has noticed subjective bloating. She felt some of this is related to her bowels. She has noticed progressive early satiety. [name] not really had any significant nausea and vomiting.   She underwent a thoracentesis on December 29, 2011 which revealed malignant cells. The differential considerations for the cytology and the immunophenotype included primary mammary carcinoma in a primary gynecologic carcinoma. In addition, should a paracentesis on November 18 that revealed malignant cells similar to those seen on her this cytology specimens.   CT imaging revealed:  Findings: Bilateral pleural effusions, left greater than right. Associated airspace consolidations. Heart size within normal  limits. Coronary artery calcification.  Unremarkable liver, spleen, pancreas, biliary system, adrenal glands. Symmetric renal enhancement. No hydronephrosis or hydroureter. No bowel obstruction. No CT evidence for colitis. There is a  moderate amount of ascites. Peritoneal carcinomatosis, with the largest conglomerate in the left paracolic gutter, measuring approximately 7.7 x 6.2 cm and displacing the small bowel centrally. No free intraperitoneal air. No lymphadenopathy.  Thin-walled bladder. Uterus not identified. 5.4 cm right adnexal cyst. Enlarged inguinal lymph nodes, measuring up to 13 mm on the left. There is scattered atherosclerotic calcification of the aorta and its branches. No aneurysmal dilatation.  Multilevel degenerative changes without acute osseous finding. Multiple subcutaneous rounded densities are nonspecific and may  reflect sequelae of prior injection sites, however metastases not excluded.   IMPRESSION:  Bilateral pleural effusions and associated consolidations are partially imaged. Peritoneal carcinomatosis and a moderate amount of ascites. 5.4 cm right adnexal cyst is indeterminate. Inguinal lymphadenopathy.   Ultrasound on November 17 revealed:  Uterus: Surgically absent.  Right ovary: Complex cystic right adnexal mass measuring 3.1 x 5.7 x 4.5 cm with surrounding rim of suspected ovarian tissue.  Left ovary: Not visualized transabdominally/transvaginally.  Other findings: Moderate pelvic ascites with internal echoes.  IMPRESSION:  5.7 cm complex cystic right ovarian lesion. Given the associated findings on CT, this appearance is worrisome for primary ovarian neoplasm.  Status post hysterectomy. Left ovary is not discretely visualized. Correlate with surgical history.  We are asked to see her to offer recommendations regarding the treatment of a possible ovarian or intrapelvic serous carcinoma. Her CA 125 is elevated at 392.9. Her CEA is less  than 0.5. In addition her CA  27-29 is in the normal range at 35 but is elevated over her baseline one year ago.    After three cycles of chemotherapy her CT revealed: IMPRESSION:  Trace bilateral pleural effusions with indwelling pleural drains.  Otherwise, no evidence of metastatic disease in the chest.  CT ABDOMEN AND PELVIS  Findings: Peritoneal carcinomatosis overlying the right hepatic dome (series 2/image 52). Liver is otherwise unremarkable.  Spleen, pancreas, and adrenal glands are within normal limits.  Gallbladder is unremarkable. No intrahepatic or extrahepatic ductal dilatation.  Kidneys are within normal limits. No hydronephrosis.  No evidence of bowel obstruction.  Atherosclerotic calcifications of the abdominal aorta and branch vessels.  10 mm short axis retrocaval node (series 2/image 61). Additional small retroperitoneal nodes which do not meet pathologic CT size  criteria. 12 mm right inguinal node (series 2/image 115), previously 10 mm. 15 mm left inguinal node (series 2/image 117),  previously 13 mm.  Near complete resolution of prior abdominopelvic ascites.  Peritoneal carcinomatosis, similar versus mildly improved.  Dominant lesion in the left mid abdomen measures 10.6 x 5.8 cm, previously 12.1 x 6.2 cm when measured in a similar fashion.  Additional omental caking beneath the lower anterior abdominal wall.  7.1 x 4.5 cm complex cystic right ovarian mass (series 2/image 12), previously 6 x 1 x 3.9 cm.  Status post hysterectomy.  Bladder is unremarkable.  Nodularity/thickening in the periumbilical region are worrisome for tumor (series 2/image 82). Additional subcutaneous lesions in the  anterior abdominal wall are nonspecific.  Mild degenerative changes of the visualized thoracolumbar spine.  IMPRESSION:  7.1 x 4.5 cm complex cystic right ovarian mass, mildly increased. Peritoneal carcinomatosis, as described above, stable versus mildly  improved.  Near complete resolution of prior  abdominopelvic ascites.  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  Her CA 125 is also decreased from over 300 at time of diagnosis to 90 in December. We spoke at that time and she decided to proceed with initial 3 cycles of chemotherapy before entertaining of surgery. After her sixth cycle of chemotherapy which was given on March 7 her CT scan revealed:  Essentially stable disease with no significant improvement. She continues to have extensive omental cake and extensive tumor in the omentum with a similar distribution compared to prior. There is a thickness of left-sided right of tumor stable at 5.4 cm. There were tumor deposits along the left paracolic gutter and cecum were unchanged. The omental tumor deposits similar to prior. The cystic right adnexal mass was unchanged.  Interval History:  She completed her sixth cycle of chemotherapy with paclitaxel and carboplatin. Her CA 125 has most recently started to rise and is in the 120s up from 90's.   After extensive counseling she opted for attempt at interval debulking. On May 30, 2012 she underwent exploratory laparotomy resection umbilical tumor partial omentectomy by Dr. Stanford Breed. Surgical findings included extensive ovarian cancer throughout the peritoneal cavity with multiple implants of the small and large bowel serosa and mesentery. In particular, the omentum was entirely replaced with tumor which was encasing the transverse colon and extending up into the splenic flexure. The right ovary was replaced by solid and cystic 7 cm mass. There was extensive peritoneal carcinomatosis. Pelvic peritoneum and rectosigmoid mesentery. At that time it was deemed that she could not have her tumor resected in a meaningful fashion. Dr. Welton Flakes saw the [name] [name] on April 24 and discussed the pathology with her.  Her pathology revealed: Diagnosis 1. 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 2. Omentum, resection  for tumor - HIGH GRADE CARCINOMA WITH CALCIFICATIONS.  Interval History: I last saw her in April of 2014. Since that time she's undergone 6 cycles of Topotecan with the Avastin. Her last cycle was 12/18/2012. CA 125 was elevated at the time of diagnosis at 392. The last one we have from 10/09/2012 is 30.6. Prior to cycle #6 she had a CT scan in October 2014 that revealed:  CT CHEST FINDINGS  Left anterior chest wall Port-A-Cath is present with tip terminating in the superior vena cava. Visualized thyroid is unremarkable. No significant interval change 1 cm right hilar lymph node. Normal heart size. Pericardial effusion. Dense coronary artery calcifications. Status post median sternotomy and CABG procedure. Unchanged subcentimeter lymph nodes adjacent to the distal esophagus (image 43; series 2). Central airways are patent. Motion artifact limits evaluation of the pulmonary parenchyma. Re- demonstrated small bilateral pleural effusions and left pleural-based calcifications. Small amount of consolidative opacity within the subpleural left lower lobe adjacent to the calcifications. Interval increase in size of a nodule along the left aspect of the mediastinum adjacent to the main pulmonary artery measuring 1.1 x 0.8 cm, previously 1.0 x 0.7 cm (image 27; series 2). Unchanged peripheral scarring within the lingula.   CT ABDOMEN AND PELVIS FINDINGS  Liver is normal in size and contour without focal hepatic lesion identified. Gallbladder is unremarkable. Portal vein is patent. No intrahepatic or extrahepatic biliary ductal dilatation. The spleen, pancreas and bilateral adrenal glands are unremarkable. The kidneys enhance symmetrically with contrast. Normal caliber abdominal aorta. Urinary bladder is grossly unremarkable. Re- demonstrated 5.3 cm right cystic adnexal lesion. No abnormal bowel wall thickening. No evidence for bowel obstruction. Re- demonstrated rind of tissue surrounding the liver. Additionally no  significant interval change in omental caking predominantly involving the anterior and left hemi abdomen measuring up to approximately 5.5 cm in greatest dimension. There has been slight interval necrosis/fluid involving the soft tissue and nodularity along the left lateral hemiabdomen. Multiple additional soft tissue deposits within the small bowel mesentery and adjacent to the stomach within the upper abdomen are grossly stable including a 2.3 x 2.0 cm soft tissue deposit, previously measuring the same. High-density bilateral inguinal masses are unchanged including a 1.4 cm left inguinal node (image 119; series 2). Mid thoracic spine degenerative change. Lower lumbar spine degenerative change. Bilateral SI joint fusion. No aggressive appearing osseous lesions.  IMPRESSION:  CT chest:  1. Slight interval increase in size of hypermetabolic nodule along the left aspect of the mediastinum, compatible with metastatic disease.  2. Persistent small left pleural effusions. Additionally there is focal consolidative opacity adjacent to the left talc pleurodesis which is favored to represent focal atelectasis.  CT abdomen pelvis:  1. Interval increase in fluid involving the soft tissue and nodularity along the left lateral hemiabdomen. Otherwise grossly stable appearance of extensive peritoneal, omental, mesenteric and inguinal metastatic disease.   Review of Systems:  She is overall doing quite well and very impressed with how she's been doing with chemotherapy. She states he she occasionally gets some mouth sores and has a bit of a runny nose. She is undergoing a very good quality of life. Her performance status is 0. She occasionally has some pain in the bilateral upper quadrants with coughing. She does occasionally have some shortness of breath with activity but overall better. She saw her pulmonologist to encourage her to walk. Previously she  was having some panicky-type feelings when she couldn't breathe but  has not had one of those episodes and more than a month her neuropathy which was worse in her feet but her hands is markedly better now. [name] able to so into other things that she enjoys. She goes out to lunch with her friends. She stay she's no longer falling asleep during the day needing to take naps. [name] eating well denies any nausea and vomiting. [name] able to now take care of her granddaughter independently 1 day a week. She states that the plan is for her to continue the topotecan and avastin that she is doing.  Review of Systems  Constitutional:  Denies fever. Skin: No rash, sores, jaundice, itching, or dryness.  Cardiovascular: No chest pain, shortness of breath, or edema  Pulmonary: No cough or wheeze.  Gastro Intestinal: Reporting intermittent upper abdominal soreness with coughing.  No nausea, vomiting, constipation, or diarrhea reported. No bright red blood per rectum or change in bowel movement.  Genitourinary: No frequency, urgency, or dysuria.  Denies vaginal bleeding and discharge.  Musculoskeletal: No myalgia, arthralgia, joint swelling or pain.  Neurologic: No weakness, numbness, or change in gait.  Psychology: doing well   Current Meds:  Outpatient Encounter Prescriptions as of 12/21/2012  Medication Sig  . ALPRAZolam (XANAX) 0.25 MG tablet Take 1 tablet (0.25 mg total) by mouth every 8 (eight) hours as needed for anxiety.  . Alum & Mag Hydroxide-Simeth (MAGIC MOUTHWASH) SOLN Take 5 mLs by mouth 4 (four) times daily.   [name] aspirin 325 MG tablet Take 325 mg by mouth at bedtime.   [name] atorvastatin (LIPITOR) 20 MG tablet TAKE ONE TABLET BY MOUTH ONCE DAILY  . carvedilol (COREG) 25 MG tablet Take 1 tablet (25 mg total) by mouth 2 (two) times daily.  [name] dexamethasone (DECADRON) 4 MG tablet Take 2 tablets (8 mg total) by mouth 2 (two) times daily with a meal. Take daily starting the day after chemotherapy for 2 days. Take with food.  . docusate sodium (COLACE) 100 MG capsule Take 200  mg by mouth daily.  [name] EPINEPHrine (EPIPEN 2-PAK) 0.3 mg/0.3 mL DEVI Inject 0.3 mg into the muscle daily as needed (allergic reaction).   . famotidine (PEPCID) 20 MG tablet Take 20 mg by mouth. Take 1 tablet at bedtime by mouth  . fluticasone (FLONASE) 50 MCG/ACT nasal spray Place 2 sprays into the nose daily as needed for allergies.  [name] gabapentin (NEURONTIN) 100 MG capsule Take 100 mg orally three times a day  . HYDROcodone-acetaminophen (NORCO/VICODIN) 5-325 MG per tablet Take 1-2 tablets by mouth every 4 (four) hours as needed.  . lidocaine-prilocaine (EMLA) cream Apply topically as needed.  . loratadine (CLARITIN) 10 MG tablet Take 10 mg by mouth. Take 1 tablet daily as needed by mouth  . nitroGLYCERIN (NITROSTAT) 0.4 MG SL tablet Place 0.4 mg under the tongue every 5 (five) minutes as needed. Chest pain  . omeprazole (PRILOSEC) 20 MG capsule Take 40 mg by mouth daily.  . ondansetron (ZOFRAN) 8 MG tablet Take 1 tablet (8 mg total) by mouth 2 (two) times daily. Take two times a day starting the day after chemo for 2 days. Then take two times a day as needed for nausea or vomiting.  [name] oxybutynin (DITROPAN-XL) 10 MG 24 hr tablet Take 10 mg by mouth daily.  Bertram Gala Glycol-Propyl Glycol 0.4-0.3 % SOLN Place 1 drop into both eyes every morning. As needed  . prochlorperazine (COMPAZINE) 25 MG  suppository Place 1 suppository (25 mg total) rectally every 12 (twelve) hours as needed for nausea.  [name] senna (SENOKOT) 8.6 MG tablet Take 1 tablet by mouth daily as needed for constipation.  . SF 5000 PLUS 1.1 % CREA dental cream Place 1.1 % onto teeth at bedtime.   [name] trimethoprim (TRIMPEX) 100 MG tablet Take 100 mg by mouth every morning.   . valACYclovir (VALTREX) 500 MG tablet Take 1 tablet (500 mg total) by mouth daily.  [name] venlafaxine XR (EFFEXOR-XR) 75 MG 24 hr capsule Take 75 mg  Orally daily    Allergy:  Allergies  Allergen Reactions  . Codeine Nausea And Vomiting       . Isosorbide Other (See  Comments)    Extreme headaches  . Other Swelling    All Lip moisturizers except Vaseline.  . Phenothiazines Other (See Comments)    Makes her stop breathing.  Durene Fruits [Pentazocine] Nausea And Vomiting  . Yellow Jacket Venom Anaphylaxis  . Ambien [Zolpidem Tartrate]     Bad dreams  . Tegaderm Ag Mesh [Silver] Rash    USE ONLY OPSITE TO PAC    Social Hx:   History   Social History  . Marital Status: Married    Spouse Name: N/A    Number of Children: 1  . Years of Education: N/A   Occupational History  . media Geophysicist/field seismologist and ESL coordinator [employer]   Social History Main Topics  . Smoking status: Former Smoker -- 0.12 packs/day for 10 years    Types: Cigarettes    Quit date: 02/15/2001  . Smokeless tobacco: Never Used  . Alcohol Use: 0.0 oz/week     Comment: 12/28/11 "1 gin & tonic q hs", 2/5/141-2 glass a wine occa  05/26/12 none x 1 year  . Drug Use: No  . Sexual Activity: Not Currently    Birth Control/ Protection: Post-menopausal   Other Topics Concern  . Not on file   Social History Narrative   Lives with husband and dog    Past Surgical Hx:  Past Surgical History  Procedure Laterality Date  . Breast lumpectomy  04/2009    left  . Cesarean section  1973; 1976  . Muscle release  1960    L neck and chest.; "when I was 13; pneumonia settled in my left neck"  . Coronary artery bypass graft  08/18/2011    Procedure: CORONARY ARTERY BYPASS GRAFTING (CABG);  Surgeon: Alleen Borne, MD;  Location: [facility] OR;  Service: Open Heart Surgery;  Laterality: N/A;  Coronary Artery Bypass Graft times five utilizing the left internal mammary artery and the left greater saphenous vein harvested endoscopically.  . Abdominal hysterectomy  1976  . Appendectomy  1976  . Portacath placement  01/06/2012    Procedure: INSERTION PORT-A-CATH;  Surgeon: Alleen Borne, MD;  Location: [facility] OR;  Service: Thoracic;  Laterality: Left;  . Chest tube insertion  01/06/2012    Procedure:  INSERTION PLEURAL DRAINAGE CATHETER;  Surgeon: Alleen Borne, MD;  Location: [facility] OR;  Service: Thoracic;  Laterality: Bilateral;  . Removal of pleural drainage catheter Right 04/12/2012    Procedure: MINOR REMOVAL OF PLEURAL DRAINAGE CATHETER;  Surgeon: Alleen Borne, MD;  Location: [facility] OR;  Service: Thoracic;  Laterality: Right;  . Talc pleurodesis Left 04/12/2012    Procedure: Lurlean Nanny;  Surgeon: Alleen Borne, MD;  Location: [facility] OR;  Service: Thoracic;  Laterality: Left;  . Portacath placement Left 04/17/2012  Procedure: INSERTION PORT-A-CATH;  Surgeon: Alleen Borne, MD;  Location: [facility] OR;  Service: Thoracic;  Laterality: Left;  . Removal of pleural drainage catheter Left 04/17/2012    Procedure: REMOVAL OF PLEURAL DRAINAGE CATHETER;  Surgeon: Alleen Borne, MD;  Location: [facility] OR;  Service: Thoracic;  Laterality: Left;  . Port-a-cath removal Left 04/17/2012    Procedure: REMOVAL PORT-A-CATH;  Surgeon: Alleen Borne, MD;  Location: [facility] OR;  Service: Thoracic;  Laterality: Left;  . Cardiac catheterization    . Laparotomy Bilateral 05/30/2012    Procedure: Resection of umbilical mass, Partial omentectomy;  Surgeon: Jeannette Corpus, MD;  Location: [facility] ORS;  Service: Gynecology;  Laterality: Bilateral;    Past Medical Hx:  Past Medical History  Diagnosis Date  . Interstitial cystitis     on chronic antibiotics  . Hyperlipidemia   . Frequent UTI     on prophylaxis  . 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 CAD (coronary artery disease)     a. s/p CABG 7/13;   b.  LHC 12/01/11:  pLAD 70%, mLAD 40%, CFX 40-50% prior to takeoff of the OM2, oRCA occluded, mid vessel filled via R->R collaterals and distal vessel filled by L->R collaterals, S-OM1/OM2 (small and diffusely dz) with mid 90% stenosis, 90% at OM1 anastomotic site, continuation of OM2 occluded, S-Dx patent, S-PDA occluded, L-LAD ok, EF 55-65%  => Med Rx rec.  [name] Hx of echocardiogram     Echo 5/13: EF 60-65%, grade 2 diastolic dysfunction   . Hypercholesterolemia   . Pleural effusion 12/28/11    s/p pleurx catheter  . GERD (gastroesophageal reflux disease)   . Arthritis     "just a little; lower back" (12/28/11)  . Gout attack 08/2011    related to "stress post OHS"  . Depression   . Breast cancer     "left"; on Tamoxifen  . S/P thoracentesis 12/28/11    "for pleural effusion" (12/28/2011)  . Ovarian cancer   . Tachycardia   . Neuromuscular disorder     peripheral neuropathy    Family Hx:  Family History  Problem Relation Age of Onset  . Cancer Mother 78    breast cancer  . Heart disease Father 6    MI at 66, CABG in 65's  . Hepatitis Father     C from blood transfusion  . Heart disease Brother     CABG in 60's  . Heart disease Paternal Aunt   . Heart disease Paternal Uncle   . Heart disease Paternal Grandfather   . Diabetes Neg Hx     Vitals:  Blood pressure 122/80, pulse 66, temperature 98 F (36.7 C), temperature source Oral, resp. rate 16.  Physical Exam: Well-nourished well-developed female in no acute distress.  Neck: Supple, no lymphadenopathy no thyromegaly.  Lungs: Clear to auscultation bilaterally  Cardiovascular: Regular rate and rhythm.  Abdomen: Well-healed vertical midline incision. The abdomen is somewhat tense and distended. There is no distinct fluid wave.   Extremities: Trace edema.  Pelvic: Normal female genitalia. Bimanual examination reveals a 5 cm mobile mass palpable at the top of the vaginal apex. There is no nodularity. Rectal confirms.   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 She is malignant ascites consistent with a gynecologic primary. [name] undergone 6 cycles of paclitaxel and carboplatin based chemotherapy.  Interval debulking was not successful in  April 2014 and she was suboptimally debulked. She is currently on topotecan and Avastin and  is status post 6 cycles with essentially stable disease on CT scan. We will followup in results of her CA 125 from today. She will return to see [place] in 3-4 months or when necessary per Dr. Welton Flakes.  Lisaann Atha A., MD 12/21/2012, 11:31 AM

## 2012-12-21 NOTE — Patient Instructions (Addendum)
We will contact you with the results of your CA 125 from today.  Follow up with Dr. Park Breed as scheduled.

## 2012-12-22 LAB — CA 125: CA 125: 54.3 U/mL — ABNORMAL HIGH (ref 0.0–30.2)

## 2012-12-25 ENCOUNTER — Other Ambulatory Visit (HOSPITAL_BASED_OUTPATIENT_CLINIC_OR_DEPARTMENT_OTHER): Payer: Medicare Other | Admitting: Lab

## 2012-12-25 ENCOUNTER — Ambulatory Visit (HOSPITAL_BASED_OUTPATIENT_CLINIC_OR_DEPARTMENT_OTHER): Payer: Medicare Other | Admitting: Adult Health

## 2012-12-25 ENCOUNTER — Encounter: Payer: Self-pay | Admitting: Adult Health

## 2012-12-25 ENCOUNTER — Ambulatory Visit (HOSPITAL_BASED_OUTPATIENT_CLINIC_OR_DEPARTMENT_OTHER): Payer: Medicare Other

## 2012-12-25 VITALS — BP 111/69 | HR 85 | Temp 97.8°F | Resp 20 | Ht 66.0 in | Wt 161.5 lb

## 2012-12-25 DIAGNOSIS — C786 Secondary malignant neoplasm of retroperitoneum and peritoneum: Secondary | ICD-10-CM

## 2012-12-25 DIAGNOSIS — C569 Malignant neoplasm of unspecified ovary: Secondary | ICD-10-CM

## 2012-12-25 DIAGNOSIS — Z5111 Encounter for antineoplastic chemotherapy: Secondary | ICD-10-CM

## 2012-12-25 DIAGNOSIS — D0590 Unspecified type of carcinoma in situ of unspecified breast: Secondary | ICD-10-CM

## 2012-12-25 LAB — CBC WITH DIFFERENTIAL/PLATELET
BASO%: 2.5 % — ABNORMAL HIGH (ref 0.0–2.0)
EOS%: 1.9 % (ref 0.0–7.0)
LYMPH%: 16.3 % (ref 14.0–49.7)
MCH: 30.1 pg (ref 25.1–34.0)
MCHC: 30.7 g/dL — ABNORMAL LOW (ref 31.5–36.0)
MONO#: 0.5 10*3/uL (ref 0.1–0.9)
NEUT#: 3.3 10*3/uL (ref 1.5–6.5)
NEUT%: 69.6 % (ref 38.4–76.8)
Platelets: 296 10*3/uL (ref 145–400)
RBC: 3.32 10*6/uL — ABNORMAL LOW (ref 3.70–5.45)
WBC: 4.7 10*3/uL (ref 3.9–10.3)
lymph#: 0.8 10*3/uL — ABNORMAL LOW (ref 0.9–3.3)
nRBC: 8 % — ABNORMAL HIGH (ref 0–0)

## 2012-12-25 LAB — COMPREHENSIVE METABOLIC PANEL (CC13)
ALT: 29 U/L (ref 0–55)
AST: 21 U/L (ref 5–34)
Albumin: 3 g/dL — ABNORMAL LOW (ref 3.5–5.0)
Alkaline Phosphatase: 76 U/L (ref 40–150)
Anion Gap: 11 mEq/L (ref 3–11)
CO2: 21 mEq/L — ABNORMAL LOW (ref 22–29)
Calcium: 8.8 mg/dL (ref 8.4–10.4)
Chloride: 109 mEq/L (ref 98–109)
Creatinine: 0.7 mg/dL (ref 0.6–1.1)
Potassium: 4.2 mEq/L (ref 3.5–5.1)
Total Bilirubin: 0.3 mg/dL (ref 0.20–1.20)

## 2012-12-25 LAB — TECHNOLOGIST REVIEW

## 2012-12-25 MED ORDER — SODIUM CHLORIDE 0.9 % IJ SOLN
10.0000 mL | INTRAMUSCULAR | Status: DC | PRN
Start: 2012-12-25 — End: 2012-12-25
  Administered 2012-12-25: 10 mL
  Filled 2012-12-25: qty 10

## 2012-12-25 MED ORDER — ONDANSETRON 8 MG/50ML IVPB (CHCC)
8.0000 mg | Freq: Once | INTRAVENOUS | Status: AC
  Administered 2012-12-25: 8 mg via INTRAVENOUS

## 2012-12-25 MED ORDER — DEXAMETHASONE SODIUM PHOSPHATE 10 MG/ML IJ SOLN
INTRAMUSCULAR | Status: AC
  Filled 2012-12-25: qty 1

## 2012-12-25 MED ORDER — SODIUM CHLORIDE 0.9 % IV SOLN
Freq: Once | INTRAVENOUS | Status: AC
  Administered 2012-12-25: 11:00:00 via INTRAVENOUS

## 2012-12-25 MED ORDER — SODIUM CHLORIDE 0.9 % IV SOLN
3.9000 mg/m2 | Freq: Once | INTRAVENOUS | Status: AC
  Administered 2012-12-25: 7 mg via INTRAVENOUS
  Filled 2012-12-25: qty 7

## 2012-12-25 MED ORDER — HEPARIN SOD (PORK) LOCK FLUSH 100 UNIT/ML IV SOLN
500.0000 [IU] | Freq: Once | INTRAVENOUS | Status: AC | PRN
  Administered 2012-12-25: 500 [IU]
  Filled 2012-12-25: qty 5

## 2012-12-25 MED ORDER — DEXAMETHASONE SODIUM PHOSPHATE 10 MG/ML IJ SOLN
10.0000 mg | Freq: Once | INTRAMUSCULAR | Status: AC
  Administered 2012-12-25: 10 mg via INTRAVENOUS

## 2012-12-25 MED ORDER — ONDANSETRON 8 MG/NS 50 ML IVPB
INTRAVENOUS | Status: AC
  Filled 2012-12-25: qty 8

## 2012-12-25 NOTE — Progress Notes (Signed)
[facility]  [phone] [phone]  OFFICE PROGRESS NOTE   PCP: Elesa Massed, M.D. SU: Emelia Loron, M.D. RAD ONC: Antony Blackbird, M.D.   DIAGNOSIS: [name] [name] is a 67 year-old female with a history of ductal carcinoma in situ of the left breast diagnosed 04/2009 and ovarian cancer diagnosed in 12/2011.   PRIOR THERAPY: #1. 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 Patient had 3 sentinel biopsied all of them were negative for metastatic disease. Patient underwent radiation therapy between 06/05/2009 through 07/02/2009. She was then begun on Tamoxifen 20 mg daily in 09/2009.  #2  In 12/2011 she received the diagnosis of gynecologic malignancy presenting with abdominal mass and pleural effusions. Patient was originally hospitalized with shortness of breath in 12/2011.  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 She also had malignant ascites.  She underwent bilateral thoracentesis and paracentesis procedures during her hospitalization. During her hospitalization she was seen by gynecologic oncology.  #3 Patient began neoadjuvant chemotherapy consisting of Taxol and Carboplatinum. Her first cycle was administered during her hospitalization. She has completed a total of 6 cycles of Taxol/Carboplatinum from 01/07/2012 - 06/23/2012. Overall she tolerated it well except for some fatigue and neuropathies.  #4 Patient is status post laparotomy in 05/2012 that revealed significant residual disease.   #5  Patient began adjuvant therapy with Topotecan/Avastin beginning on 07/24/2012.   CURRENT THERAPY: Topotecan/Avastin   INTERVAL HISTORY:  [name] [name] 67 y.o. female returns today for followup of ovarian cancer.  Patient is doing well today.  She has been sewing this week.  She denies fevers,  chills, nasuea, vomiting, constipation, diarrhea, numbness.  She continues to have mild nasal drainage mostly in the morning.  Her shortness of breath continues to improve with her dietary changes, and increased activity level with walking.     MEDICAL HISTORY: Past Medical History  Diagnosis Date  . Interstitial cystitis     on chronic antibiotics  . Hyperlipidemia   . Frequent UTI     on prophylaxis  . 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 CAD (coronary artery disease)     a. s/p CABG 7/13;   b.  LHC 12/01/11:  pLAD 70%, mLAD 40%, CFX 40-50% prior to takeoff of the OM2, oRCA occluded, mid vessel filled via R->R collaterals and distal vessel filled by L->R collaterals, S-OM1/OM2 (small and diffusely dz) with mid 90% stenosis, 90% at OM1 anastomotic site, continuation of OM2 occluded, S-Dx patent, S-PDA occluded, L-LAD ok, EF 55-65%  => Med Rx rec.  [name] Hx of echocardiogram     Echo 5/13: EF 60-65%, grade 2 diastolic dysfunction  . Hypercholesterolemia   . Pleural effusion 12/28/11    s/p pleurx catheter  . GERD (gastroesophageal reflux disease)   . Arthritis     "just a little; lower back" (12/28/11)  . Gout attack 08/2011    related to "stress post OHS"  . Depression   . Breast cancer     "left"; on Tamoxifen  . S/P thoracentesis 12/28/11    "for pleural effusion" (12/28/2011)  . Ovarian cancer   . Tachycardia   . Neuromuscular disorder     peripheral neuropathy    ALLERGIES:  is allergic to codeine; isosorbide; other; phenothiazines; talwin; yellow jacket venom; ambien; and tegaderm ag mesh.  MEDICATIONS:  Current Outpatient Prescriptions  Medication Sig  Dispense Refill  . ALPRAZolam (XANAX) 0.25 MG tablet Take 1 tablet (0.25 mg total) by mouth every 8 (eight) hours as needed for anxiety.  30 tablet  4  . Alum & Mag Hydroxide-Simeth (MAGIC MOUTHWASH) SOLN Take 5 mLs by mouth 4 (four) times daily.       [name] aspirin 325 MG tablet Take 325 mg by mouth at bedtime.       [name] atorvastatin  (LIPITOR) 20 MG tablet TAKE ONE TABLET BY MOUTH ONCE DAILY  90 tablet  3  . carvedilol (COREG) 25 MG tablet Take 1 tablet (25 mg total) by mouth 2 (two) times daily.  180 tablet  3  . dexamethasone (DECADRON) 4 MG tablet Take 2 tablets (8 mg total) by mouth 2 (two) times daily with a meal. Take daily starting the day after chemotherapy for 2 days. Take with food.  30 tablet  1  . docusate sodium (COLACE) 100 MG capsule Take 200 mg by mouth daily.      [name] EPINEPHrine (EPIPEN 2-PAK) 0.3 mg/0.3 mL DEVI Inject 0.3 mg into the muscle daily as needed (allergic reaction).       . famotidine (PEPCID) 20 MG tablet Take 20 mg by mouth. Take 1 tablet at bedtime by mouth      . fluticasone (FLONASE) 50 MCG/ACT nasal spray Place 2 sprays into the nose daily as needed for allergies.      [name] gabapentin (NEURONTIN) 100 MG capsule Take 100 mg orally three times a day  90 capsule  6  . HYDROcodone-acetaminophen (NORCO/VICODIN) 5-325 MG per tablet Take 1-2 tablets by mouth every 4 (four) hours as needed.  40 tablet  2  . lidocaine-prilocaine (EMLA) cream Apply topically as needed.  30 g  1  . loratadine (CLARITIN) 10 MG tablet Take 10 mg by mouth. Take 1 tablet daily as needed by mouth      . nitroGLYCERIN (NITROSTAT) 0.4 MG SL tablet Place 0.4 mg under the tongue every 5 (five) minutes as needed. Chest pain      . omeprazole (PRILOSEC) 20 MG capsule Take 40 mg by mouth daily.      . ondansetron (ZOFRAN) 8 MG tablet Take 1 tablet (8 mg total) by mouth 2 (two) times daily. Take two times a day starting the day after chemo for 2 days. Then take two times a day as needed for nausea or vomiting.  30 tablet  1  . oxybutynin (DITROPAN-XL) 10 MG 24 hr tablet Take 10 mg by mouth daily.      Bertram Gala Glycol-Propyl Glycol 0.4-0.3 % SOLN Place 1 drop into both eyes every morning. As needed      . prochlorperazine (COMPAZINE) 25 MG suppository Place 1 suppository (25 mg total) rectally every 12 (twelve) hours as needed for  nausea.  12 suppository  3  . senna (SENOKOT) 8.6 MG tablet Take 1 tablet by mouth daily as needed for constipation.      . SF 5000 PLUS 1.1 % CREA dental cream Place 1.1 % onto teeth at bedtime.       [name] trimethoprim (TRIMPEX) 100 MG tablet Take 100 mg by mouth every morning.       . valACYclovir (VALTREX) 500 MG tablet Take 1 tablet (500 mg total) by mouth daily.  30 tablet  7  . venlafaxine XR (EFFEXOR-XR) 75 MG 24 hr capsule Take 75 mg  Orally daily  30 capsule  8   No current facility-administered medications for this visit.  Facility-Administered Medications Ordered in Other Visits  Medication Dose Route Frequency Provider Last Rate Last Dose  . influenza  inactive virus vaccine (FLUZONE/FLUARIX) injection 0.5 mL  0.5 mL Intramuscular Once Joselyn Arrow, MD        SURGICAL HISTORY:  Past Surgical History  Procedure Laterality Date  . Breast lumpectomy  04/2009    left  . Cesarean section  1973; 1976  . Muscle release  1960    L neck and chest.; "when I was 13; pneumonia settled in my left neck"  . Coronary artery bypass graft  08/18/2011    Procedure: CORONARY ARTERY BYPASS GRAFTING (CABG);  Surgeon: Alleen Borne, MD;  Location: [facility] OR;  Service: Open Heart Surgery;  Laterality: N/A;  Coronary Artery Bypass Graft times five utilizing the left internal mammary artery and the left greater saphenous vein harvested endoscopically.  . Abdominal hysterectomy  1976  . Appendectomy  1976  . Portacath placement  01/06/2012    Procedure: INSERTION PORT-A-CATH;  Surgeon: Alleen Borne, MD;  Location: [facility] OR;  Service: Thoracic;  Laterality: Left;  . Chest tube insertion  01/06/2012    Procedure: INSERTION PLEURAL DRAINAGE CATHETER;  Surgeon: Alleen Borne, MD;  Location: [facility] OR;  Service: Thoracic;  Laterality: Bilateral;  . Removal of pleural drainage catheter Right 04/12/2012    Procedure: MINOR REMOVAL OF PLEURAL DRAINAGE CATHETER;  Surgeon: Alleen Borne, MD;  Location: [facility] OR;  Service:  Thoracic;  Laterality: Right;  . Talc pleurodesis Left 04/12/2012    Procedure: Lurlean Nanny;  Surgeon: Alleen Borne, MD;  Location: [facility] OR;  Service: Thoracic;  Laterality: Left;  . Portacath placement Left 04/17/2012    Procedure: INSERTION PORT-A-CATH;  Surgeon: Alleen Borne, MD;  Location: [facility] OR;  Service: Thoracic;  Laterality: Left;  . Removal of pleural drainage catheter Left 04/17/2012    Procedure: REMOVAL OF PLEURAL DRAINAGE CATHETER;  Surgeon: Alleen Borne, MD;  Location: [facility] OR;  Service: Thoracic;  Laterality: Left;  . Port-a-cath removal Left 04/17/2012    Procedure: REMOVAL PORT-A-CATH;  Surgeon: Alleen Borne, MD;  Location: [facility] OR;  Service: Thoracic;  Laterality: Left;  . Cardiac catheterization    . Laparotomy Bilateral 05/30/2012    Procedure: Resection of umbilical mass, Partial omentectomy;  Surgeon: Jeannette Corpus, MD;  Location: [facility] ORS;  Service: Gynecology;  Laterality: Bilateral;    REVIEW OF SYSTEMS:   A 10 point review of systems was completed and is negative except as noted above.  PHYSICAL EXAMINATION:  BP 111/69  Pulse 85  Temp(Src) 97.8 F (36.6 C) (Oral)  Resp 20  Ht 5\' 6"  (1.676 m)  Wt 161 lb 8 oz (73.256 kg)  BMI 26.08 kg/m2  General: Patient is a well appearing female in no acute distress HEENT: PERRLA, sclerae anicteric, conjunctival pallor, MMM Neck: supple, no palpable adenopathy Lungs: clear to auscultation bilaterally, no wheezes, rhonchi, or rales Cardiovascular: regular rate rhythm, S1, S2, no murmurs, rubs or gallops Abdomen: Soft, non-tender, non-distended, normoactive bowel sounds, no HSM Extremities: warm and well perfused, no clubbing, cyanosis, or edema Skin: No rashes or lesions Neuro: Non-focal ECOG PERFORMANCE STATUS: 1 - Symptomatic but completely ambulatory.   LABORATORY DATA: Lab Results  Component Value Date   WBC 4.7 12/25/2012   HGB 10.0* 12/25/2012   HCT 32.6* 12/25/2012   MCV 98.2 12/25/2012   PLT 296  12/25/2012      Chemistry      Component Value Date/Time  NA 141 12/18/2012 0951   NA 134* 06/02/2012 0515   NA 141 12/17/2011 1144   K 4.4 12/18/2012 0951   K 4.4 06/02/2012 0515   CL 103 08/07/2012 1108   CL 100 06/02/2012 0515   CO2 23 12/18/2012 0951   CO2 25 06/02/2012 0515   BUN 16.0 12/18/2012 0951   BUN 20 06/02/2012 0515   BUN 15 12/17/2011 1144   CREATININE 0.7 12/18/2012 0951   CREATININE 0.76 06/02/2012 0515      Component Value Date/Time   CALCIUM 9.4 12/18/2012 0951   CALCIUM 8.7 06/02/2012 0515   ALKPHOS 77 12/18/2012 0951   ALKPHOS 85 05/26/2012 0955   AST 23 12/18/2012 0951   AST 22 05/26/2012 0955   ALT 31 12/18/2012 0951   ALT 16 05/26/2012 0955   BILITOT 0.39 12/18/2012 0951   BILITOT 0.3 05/26/2012 0955       RADIOGRAPHIC STUDIES: Dg Chest 2 View 08/30/2012   *RADIOLOGY REPORT*  Clinical Data: Shortness of breath, history metastatic ovarian carcinoma and history of bilateral pleural effusions.  CHEST - 2 VIEW  Comparison: 05/26/2012  Findings: There is significant diminishment in bilateral pleural effusions since the prior study with only trace amount of pleural fluid present bilaterally.  The lungs show no evidence of focal consolidation, masses or pulmonary edema.  Port-A-Cath positioning stable in the SVC.  The heart size is normal.  No bony lesions are seen.  IMPRESSION: Significant diminishment in bilateral pleural effusions since the prior chest x-ray. Trace bilateral pleural effusions remain.  No acute findings.   Original Report Authenticated By: Irish Lack, M.D.     ASSESSMENT: 67 year old woman:   #1 History of DCIS of the left breast status post lumpectomy in 04/2009.  Status post radiation therapy completed in 06/2009.  Was on antiestrogen therapy with Tamoxifen 20 mg by mouth daily that was started in 09/2009.  Tamoxifen has been discontinued for now due to her recent diagnosis of ovarian cancer.  #2 Gynecologic malignancy (ovarian carcinoma) diagnosed in  12/2011. Patient received 6 cycles of neoadjuvant chemotherapy consisting of Taxol/Carboplatinum completed in 06/2012.  #3 She is status post laparotomy performed on 05/30/2012. Unfortunately, intraoperatively she was found to have significant residual disease. 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   #4 Patient completed chemotherapy sensitivity testing. Unfortunately the results were inconclusive.  It was decided to proceed with chemotherapy consisting of Topotecan and Avastin.  Dr. Grant Ruts also recommended this treatment regimen.  #5 Patient was started chemotherapy consisting of Topotecan and Avastin  With Topotecan q week on day 1, 8,15 on a 28 day cycle, and Avastin scheduled for every 3 weeks.  #6 Depression/anxiety: Currently on Effexor with good response  #7 Shortness of breath: patient has been seen by cardilogist Dr. Elease Hashimoto and cardiac etiology was ruled out. Patient followed by Dr. Sherene Sires, last appt on 11/23/12 and he recommended a re-conditioning and exercise plan along with dietary modifications.   #8 History of dehydration -patient instructed to drink plenty of water  #9  Cold sores/herpes labialis.  #10 Neuropathy - currently on Gabapentin 100 mg by mouth 3 times a day.  Neuropathy is currently resolved with this.     PLAN: #1  Patient is doing well today.  I reviewed her labs with her in detail.  Her CA-125 from 11/6 has increased to 54.3.  She will proceed with treatment today.   #2 Per Dr.  Grant Ruts she will continue with therapy and we will continue to follow her CA-125 as the increase is minimal.    #3 She will return to [place] next week for labs, an appointment, and Topotecan.   All questions were answered.  Patient was encouraged to contact us with any questions, problems or concerns.   I spent 25 minutes counseling the patient face to face.  The total time spent in the appointment was  30 minutes.  Illa Level, NP Medical Oncology [facility] [phone] 12/25/2012  9:48 AM

## 2012-12-25 NOTE — Patient Instructions (Signed)
[facility] Discharge Instructions for Patients Receiving Chemotherapy  Today you received the following chemotherapy agents: Hycamptin.  To help prevent nausea and vomiting after your treatment, we encourage you to take your nausea medication.   If you develop nausea and vomiting that is not controlled by your nausea medication, call the clinic.   BELOW ARE SYMPTOMS THAT SHOULD BE REPORTED IMMEDIATELY:  *FEVER GREATER THAN 100.5 F  *CHILLS WITH OR WITHOUT FEVER  NAUSEA AND VOMITING THAT IS NOT CONTROLLED WITH YOUR NAUSEA MEDICATION  *UNUSUAL SHORTNESS OF BREATH  *UNUSUAL BRUISING OR BLEEDING  TENDERNESS IN MOUTH AND THROAT WITH OR WITHOUT PRESENCE OF ULCERS  *URINARY PROBLEMS  *BOWEL PROBLEMS  UNUSUAL RASH Items with * indicate a potential emergency and should be followed up as soon as possible.  Feel free to call the clinic you have any questions or concerns. The clinic phone number is [phone].

## 2012-12-28 ENCOUNTER — Ambulatory Visit: Payer: Medicare Other | Admitting: Gynecologic Oncology

## 2013-01-01 ENCOUNTER — Ambulatory Visit (HOSPITAL_BASED_OUTPATIENT_CLINIC_OR_DEPARTMENT_OTHER): Payer: Medicare Other | Admitting: Adult Health

## 2013-01-01 ENCOUNTER — Ambulatory Visit (HOSPITAL_BASED_OUTPATIENT_CLINIC_OR_DEPARTMENT_OTHER): Payer: Medicare Other

## 2013-01-01 ENCOUNTER — Other Ambulatory Visit (HOSPITAL_BASED_OUTPATIENT_CLINIC_OR_DEPARTMENT_OTHER): Payer: Medicare Other | Admitting: Lab

## 2013-01-01 ENCOUNTER — Other Ambulatory Visit: Payer: Self-pay | Admitting: *Deleted

## 2013-01-01 ENCOUNTER — Encounter: Payer: Self-pay | Admitting: Adult Health

## 2013-01-01 ENCOUNTER — Encounter: Payer: Self-pay | Admitting: Oncology

## 2013-01-01 VITALS — BP 128/77 | HR 93 | Temp 97.7°F | Resp 18 | Ht 66.0 in | Wt 161.3 lb

## 2013-01-01 DIAGNOSIS — Z5111 Encounter for antineoplastic chemotherapy: Secondary | ICD-10-CM

## 2013-01-01 DIAGNOSIS — Z5112 Encounter for antineoplastic immunotherapy: Secondary | ICD-10-CM

## 2013-01-01 DIAGNOSIS — J3489 Other specified disorders of nose and nasal sinuses: Secondary | ICD-10-CM

## 2013-01-01 DIAGNOSIS — F341 Dysthymic disorder: Secondary | ICD-10-CM

## 2013-01-01 DIAGNOSIS — D0590 Unspecified type of carcinoma in situ of unspecified breast: Secondary | ICD-10-CM

## 2013-01-01 DIAGNOSIS — R0602 Shortness of breath: Secondary | ICD-10-CM

## 2013-01-01 DIAGNOSIS — C569 Malignant neoplasm of unspecified ovary: Secondary | ICD-10-CM

## 2013-01-01 DIAGNOSIS — C786 Secondary malignant neoplasm of retroperitoneum and peritoneum: Secondary | ICD-10-CM

## 2013-01-01 DIAGNOSIS — J329 Chronic sinusitis, unspecified: Secondary | ICD-10-CM

## 2013-01-01 LAB — CBC WITH DIFFERENTIAL/PLATELET
BASO%: 0.7 % (ref 0.0–2.0)
Basophils Absolute: 0 10*3/uL (ref 0.0–0.1)
EOS%: 1.7 % (ref 0.0–7.0)
Eosinophils Absolute: 0.1 10*3/uL (ref 0.0–0.5)
HCT: 31.8 % — ABNORMAL LOW (ref 34.8–46.6)
LYMPH%: 24.2 % (ref 14.0–49.7)
MCH: 30.5 pg (ref 25.1–34.0)
MCHC: 31.1 g/dL — ABNORMAL LOW (ref 31.5–36.0)
MCV: 97.8 fL (ref 79.5–101.0)
MONO%: 8.4 % (ref 0.0–14.0)
NEUT#: 1.9 10*3/uL (ref 1.5–6.5)
RBC: 3.25 10*6/uL — ABNORMAL LOW (ref 3.70–5.45)
RDW: 22.6 % — ABNORMAL HIGH (ref 11.2–14.5)
WBC: 3 10*3/uL — ABNORMAL LOW (ref 3.9–10.3)
nRBC: 8 % — ABNORMAL HIGH (ref 0–0)

## 2013-01-01 LAB — COMPREHENSIVE METABOLIC PANEL (CC13)
ALT: 33 U/L (ref 0–55)
AST: 26 U/L (ref 5–34)
Alkaline Phosphatase: 86 U/L (ref 40–150)
Anion Gap: 12 mEq/L — ABNORMAL HIGH (ref 3–11)
CO2: 21 mEq/L — ABNORMAL LOW (ref 22–29)
Creatinine: 0.8 mg/dL (ref 0.6–1.1)
Glucose: 100 mg/dl (ref 70–140)
Sodium: 140 mEq/L (ref 136–145)
Total Bilirubin: 0.38 mg/dL (ref 0.20–1.20)
Total Protein: 6.5 g/dL (ref 6.4–8.3)

## 2013-01-01 MED ORDER — ONDANSETRON 8 MG/50ML IVPB (CHCC)
8.0000 mg | Freq: Once | INTRAVENOUS | Status: AC
  Administered 2013-01-01: 8 mg via INTRAVENOUS

## 2013-01-01 MED ORDER — SODIUM CHLORIDE 0.9 % IV SOLN
15.0000 mg/kg | Freq: Once | INTRAVENOUS | Status: AC
  Administered 2013-01-01: 1025 mg via INTRAVENOUS
  Filled 2013-01-01: qty 41

## 2013-01-01 MED ORDER — SODIUM CHLORIDE 0.9 % IV SOLN
Freq: Once | INTRAVENOUS | Status: AC
  Administered 2013-01-01: 14:00:00 via INTRAVENOUS

## 2013-01-01 MED ORDER — SODIUM CHLORIDE 0.9 % IJ SOLN
10.0000 mL | INTRAMUSCULAR | Status: DC | PRN
  Administered 2013-01-01: 10 mL
  Filled 2013-01-01: qty 10

## 2013-01-01 MED ORDER — DEXAMETHASONE SODIUM PHOSPHATE 10 MG/ML IJ SOLN
INTRAMUSCULAR | Status: AC
  Filled 2013-01-01: qty 1

## 2013-01-01 MED ORDER — AZITHROMYCIN 250 MG PO TABS: ORAL_TABLET | ORAL | Status: DC

## 2013-01-01 MED ORDER — HEPARIN SOD (PORK) LOCK FLUSH 100 UNIT/ML IV SOLN
500.0000 [IU] | Freq: Once | INTRAVENOUS | Status: AC | PRN
  Administered 2013-01-01: 500 [IU]
  Filled 2013-01-01: qty 5

## 2013-01-01 MED ORDER — TOPOTECAN HCL CHEMO INJECTION 4 MG
3.9000 mg/m2 | Freq: Once | INTRAVENOUS | Status: AC
  Administered 2013-01-01: 7 mg via INTRAVENOUS
  Filled 2013-01-01: qty 7

## 2013-01-01 MED ORDER — DEXAMETHASONE SODIUM PHOSPHATE 10 MG/ML IJ SOLN
10.0000 mg | Freq: Once | INTRAMUSCULAR | Status: AC
  Administered 2013-01-01: 10 mg via INTRAVENOUS

## 2013-01-01 MED ORDER — ONDANSETRON 8 MG/NS 50 ML IVPB
INTRAVENOUS | Status: AC
  Filled 2013-01-01: qty 8

## 2013-01-01 NOTE — Progress Notes (Addendum)
[facility]  [phone] [phone]  OFFICE PROGRESS NOTE   PCP: Elesa Massed, M.D. SU: Emelia Loron, M.D. RAD ONC: Antony Blackbird, M.D.   DIAGNOSIS: [name] [name] is a 67 year-old female with a history of ductal carcinoma in situ of the left breast diagnosed 04/2009 and ovarian cancer diagnosed in 12/2011.   PRIOR THERAPY: #1. 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 Patient had 3 sentinel biopsied all of them were negative for metastatic disease. Patient underwent radiation therapy between 06/05/2009 through 07/02/2009. She was then begun on Tamoxifen 20 mg daily in 09/2009.  #2  In 12/2011 she received the diagnosis of gynecologic malignancy presenting with abdominal mass and pleural effusions. Patient was originally hospitalized with shortness of breath in 12/2011.  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 She also had malignant ascites.  She underwent bilateral thoracentesis and paracentesis procedures during her hospitalization. During her hospitalization she was seen by gynecologic oncology.  #3 Patient began neoadjuvant chemotherapy consisting of Taxol and Carboplatinum. Her first cycle was administered during her hospitalization. She has completed a total of 6 cycles of Taxol/Carboplatinum from 01/07/2012 - 06/23/2012. Overall she tolerated it well except for some fatigue and neuropathies.  #4 Patient is status post laparotomy in 05/2012 that revealed significant residual disease.   #5  Patient began adjuvant therapy with Topotecan/Avastin beginning on 07/24/2012.   CURRENT THERAPY: Topotecan/Avastin   INTERVAL HISTORY:  [name] [name] 67 y.o. female returns today for evaluation prior to receiving treatment with Topotecan and Avastin.  She continues to have pain above the bridge of  her nose that has been ongoing for 2-3 weeks.  She has nasal drainage that is bloody mucous and clots.  She has had this before and when she received a Zpak it improved.  Otherwise, a 10 point ROS is neg.    MEDICAL HISTORY: Past Medical History  Diagnosis Date  . Interstitial cystitis     on chronic antibiotics  . Hyperlipidemia   . Frequent UTI     on prophylaxis  . 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 CAD (coronary artery disease)     a. s/p CABG 7/13;   b.  LHC 12/01/11:  pLAD 70%, mLAD 40%, CFX 40-50% prior to takeoff of the OM2, oRCA occluded, mid vessel filled via R->R collaterals and distal vessel filled by L->R collaterals, S-OM1/OM2 (small and diffusely dz) with mid 90% stenosis, 90% at OM1 anastomotic site, continuation of OM2 occluded, S-Dx patent, S-PDA occluded, L-LAD ok, EF 55-65%  => Med Rx rec.  [name] Hx of echocardiogram     Echo 5/13: EF 60-65%, grade 2 diastolic dysfunction  . Hypercholesterolemia   . Pleural effusion 12/28/11    s/p pleurx catheter  . GERD (gastroesophageal reflux disease)   . Arthritis     "just a little; lower back" (12/28/11)  . Gout attack 08/2011    related to "stress post OHS"  . Depression   . Breast cancer     "left"; on Tamoxifen  . S/P thoracentesis 12/28/11    "for pleural effusion" (12/28/2011)  . Ovarian cancer   . Tachycardia   . Neuromuscular disorder     peripheral neuropathy    ALLERGIES:  is allergic to codeine; isosorbide; other; phenothiazines; talwin; yellow jacket venom; ambien; and tegaderm ag mesh.  MEDICATIONS:  Current Outpatient Prescriptions  Medication Sig Dispense Refill  . ALPRAZolam (XANAX) 0.25 MG tablet Take 1 tablet (0.25 mg total) by mouth every 8 (eight) hours as needed for anxiety.  30 tablet  4  . Alum & Mag Hydroxide-Simeth (MAGIC MOUTHWASH) SOLN Take 5 mLs by mouth 4 (four) times daily.       [name] aspirin 325 MG tablet Take 325 mg by mouth at bedtime.       [name] atorvastatin (LIPITOR) 20 MG tablet TAKE ONE TABLET  BY MOUTH ONCE DAILY  90 tablet  3  . carvedilol (COREG) 25 MG tablet Take 1 tablet (25 mg total) by mouth 2 (two) times daily.  180 tablet  3  . dexamethasone (DECADRON) 4 MG tablet Take 2 tablets (8 mg total) by mouth 2 (two) times daily with a meal. Take daily starting the day after chemotherapy for 2 days. Take with food.  30 tablet  1  . docusate sodium (COLACE) 100 MG capsule Take 200 mg by mouth daily.      [name] EPINEPHrine (EPIPEN 2-PAK) 0.3 mg/0.3 mL DEVI Inject 0.3 mg into the muscle daily as needed (allergic reaction).       . famotidine (PEPCID) 20 MG tablet Take 20 mg by mouth. Take 1 tablet at bedtime by mouth      . fluticasone (FLONASE) 50 MCG/ACT nasal spray Place 2 sprays into the nose daily as needed for allergies.      [name] gabapentin (NEURONTIN) 100 MG capsule Take 100 mg orally three times a day  90 capsule  6  . HYDROcodone-acetaminophen (NORCO/VICODIN) 5-325 MG per tablet Take 1-2 tablets by mouth every 4 (four) hours as needed.  40 tablet  2  . lidocaine-prilocaine (EMLA) cream Apply topically as needed.  30 g  1  . loratadine (CLARITIN) 10 MG tablet Take 10 mg by mouth. Take 1 tablet daily as needed by mouth      . nitroGLYCERIN (NITROSTAT) 0.4 MG SL tablet Place 0.4 mg under the tongue every 5 (five) minutes as needed. Chest pain      . omeprazole (PRILOSEC) 20 MG capsule Take 40 mg by mouth daily.      . ondansetron (ZOFRAN) 8 MG tablet Take 1 tablet (8 mg total) by mouth 2 (two) times daily. Take two times a day starting the day after chemo for 2 days. Then take two times a day as needed for nausea or vomiting.  30 tablet  1  . oxybutynin (DITROPAN-XL) 10 MG 24 hr tablet Take 10 mg by mouth daily.      Bertram Gala Glycol-Propyl Glycol 0.4-0.3 % SOLN Place 1 drop into both eyes every morning. As needed      . prochlorperazine (COMPAZINE) 25 MG suppository Place 1 suppository (25 mg total) rectally every 12 (twelve) hours as needed for nausea.  12 suppository  3  . senna  (SENOKOT) 8.6 MG tablet Take 1 tablet by mouth daily as needed for constipation.      . SF 5000 PLUS 1.1 % CREA dental cream Place 1.1 % onto teeth at bedtime.       [name] trimethoprim (TRIMPEX) 100 MG tablet Take 100 mg by mouth every morning.       . valACYclovir (VALTREX) 500 MG tablet Take 1 tablet (500 mg total) by mouth daily.  30 tablet  7  . venlafaxine XR (EFFEXOR-XR) 75 MG 24 hr capsule Take 75 mg  Orally daily  30 capsule  8  . azithromycin (ZITHROMAX) 250 MG tablet  2 tabs po x day 1, then 1 tab po daily until complete.  6 each  0   No current facility-administered medications for this visit.   Facility-Administered Medications Ordered in Other Visits  Medication Dose Route Frequency Provider Last Rate Last Dose  . influenza  inactive virus vaccine (FLUZONE/FLUARIX) injection 0.5 mL  0.5 mL Intramuscular Once Joselyn Arrow, MD        SURGICAL HISTORY:  Past Surgical History  Procedure Laterality Date  . Breast lumpectomy  04/2009    left  . Cesarean section  1973; 1976  . Muscle release  1960    L neck and chest.; "when I was 13; pneumonia settled in my left neck"  . Coronary artery bypass graft  08/18/2011    Procedure: CORONARY ARTERY BYPASS GRAFTING (CABG);  Surgeon: Alleen Borne, MD;  Location: [facility] OR;  Service: Open Heart Surgery;  Laterality: N/A;  Coronary Artery Bypass Graft times five utilizing the left internal mammary artery and the left greater saphenous vein harvested endoscopically.  . Abdominal hysterectomy  1976  . Appendectomy  1976  . Portacath placement  01/06/2012    Procedure: INSERTION PORT-A-CATH;  Surgeon: Alleen Borne, MD;  Location: [facility] OR;  Service: Thoracic;  Laterality: Left;  . Chest tube insertion  01/06/2012    Procedure: INSERTION PLEURAL DRAINAGE CATHETER;  Surgeon: Alleen Borne, MD;  Location: [facility] OR;  Service: Thoracic;  Laterality: Bilateral;  . Removal of pleural drainage catheter Right 04/12/2012    Procedure: MINOR REMOVAL OF PLEURAL DRAINAGE  CATHETER;  Surgeon: Alleen Borne, MD;  Location: [facility] OR;  Service: Thoracic;  Laterality: Right;  . Talc pleurodesis Left 04/12/2012    Procedure: Lurlean Nanny;  Surgeon: Alleen Borne, MD;  Location: [facility] OR;  Service: Thoracic;  Laterality: Left;  . Portacath placement Left 04/17/2012    Procedure: INSERTION PORT-A-CATH;  Surgeon: Alleen Borne, MD;  Location: [facility] OR;  Service: Thoracic;  Laterality: Left;  . Removal of pleural drainage catheter Left 04/17/2012    Procedure: REMOVAL OF PLEURAL DRAINAGE CATHETER;  Surgeon: Alleen Borne, MD;  Location: [facility] OR;  Service: Thoracic;  Laterality: Left;  . Port-a-cath removal Left 04/17/2012    Procedure: REMOVAL PORT-A-CATH;  Surgeon: Alleen Borne, MD;  Location: [facility] OR;  Service: Thoracic;  Laterality: Left;  . Cardiac catheterization    . Laparotomy Bilateral 05/30/2012    Procedure: Resection of umbilical mass, Partial omentectomy;  Surgeon: Jeannette Corpus, MD;  Location: [facility] ORS;  Service: Gynecology;  Laterality: Bilateral;    REVIEW OF SYSTEMS:   A 10 point review of systems was completed and is negative except as noted above.  PHYSICAL EXAMINATION:  BP 128/77  Pulse 93  Temp(Src) 97.7 F (36.5 C) (Oral)  Resp 18  Ht 5\' 6"  (1.676 m)  Wt 161 lb 4.8 oz (73.165 kg)  BMI 26.05 kg/m2  General: Patient is a well appearing female in no acute distress HEENT: PERRLA, sclerae anicteric, conjunctival pallor, MMM Neck: supple, no palpable adenopathy Lungs: clear to auscultation bilaterally, no wheezes, rhonchi, or rales Cardiovascular: regular rate rhythm, S1, S2, no murmurs, rubs or gallops Abdomen: Soft, non-tender, non-distended, normoactive bowel sounds, no HSM Extremities: warm and well perfused, no clubbing, cyanosis, or edema Skin: No rashes or lesions Neuro: Non-focal ECOG PERFORMANCE STATUS: 1 - Symptomatic but completely ambulatory.   LABORATORY DATA: Lab Results  Component Value Date   WBC 3.0* 01/01/2013   HGB 9.9*  01/01/2013  HCT 31.8* 01/01/2013   MCV 97.8 01/01/2013   PLT 107* 01/01/2013      Chemistry      Component Value Date/Time   NA 140 01/01/2013 1256   NA 134* 06/02/2012 0515   NA 141 12/17/2011 1144   K 4.0 01/01/2013 1256   K 4.4 06/02/2012 0515   CL 103 08/07/2012 1108   CL 100 06/02/2012 0515   CO2 21* 01/01/2013 1256   CO2 25 06/02/2012 0515   BUN 15.5 01/01/2013 1256   BUN 20 06/02/2012 0515   BUN 15 12/17/2011 1144   CREATININE 0.8 01/01/2013 1256   CREATININE 0.76 06/02/2012 0515      Component Value Date/Time   CALCIUM 9.1 01/01/2013 1256   CALCIUM 8.7 06/02/2012 0515   ALKPHOS 86 01/01/2013 1256   ALKPHOS 85 05/26/2012 0955   AST 26 01/01/2013 1256   AST 22 05/26/2012 0955   ALT 33 01/01/2013 1256   ALT 16 05/26/2012 0955   BILITOT 0.38 01/01/2013 1256   BILITOT 0.3 05/26/2012 0955       RADIOGRAPHIC STUDIES: Dg Chest 2 View 08/30/2012   *RADIOLOGY REPORT*  Clinical Data: Shortness of breath, history metastatic ovarian carcinoma and history of bilateral pleural effusions.  CHEST - 2 VIEW  Comparison: 05/26/2012  Findings: There is significant diminishment in bilateral pleural effusions since the prior study with only trace amount of pleural fluid present bilaterally.  The lungs show no evidence of focal consolidation, masses or pulmonary edema.  Port-A-Cath positioning stable in the SVC.  The heart size is normal.  No bony lesions are seen.  IMPRESSION: Significant diminishment in bilateral pleural effusions since the prior chest x-ray. Trace bilateral pleural effusions remain.  No acute findings.   Original Report Authenticated By: Irish Lack, M.D.     ASSESSMENT: 67 year old woman:   #1 History of DCIS of the left breast status post lumpectomy in 04/2009.  Status post radiation therapy completed in 06/2009.  Was on antiestrogen therapy with Tamoxifen 20 mg by mouth daily that was started in 09/2009.  Tamoxifen has been discontinued for now due to her recent diagnosis  of ovarian cancer.  #2 Gynecologic malignancy (ovarian carcinoma) diagnosed in 12/2011. Patient received 6 cycles of neoadjuvant chemotherapy consisting of Taxol/Carboplatinum completed in 06/2012.  #3 She is status post laparotomy performed on 05/30/2012. Unfortunately, intraoperatively she was found to have significant residual disease. 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   #4 Patient completed chemotherapy sensitivity testing. Unfortunately the results were inconclusive.  It was decided to proceed with chemotherapy consisting of Topotecan and Avastin.  Dr. Grant Ruts also recommended this treatment regimen.  #5 Patient was started chemotherapy consisting of Topotecan and Avastin  With Topotecan q week on day 1, 8,15 on a 28 day cycle, and Avastin scheduled for every 3 weeks.  #6 Depression/anxiety: Currently on Effexor with good response  #7 Shortness of breath: patient has been seen by cardilogist Dr. Elease Hashimoto and cardiac etiology was ruled out. Patient followed by Dr. Sherene Sires, last appt on 11/23/12 and he recommended a re-conditioning and exercise plan along with dietary modifications.   #8 History of dehydration -patient instructed to drink plenty of water  #9  Cold sores/herpes labialis.  #10 Neuropathy - currently on Gabapentin 100 mg by mouth 3 times a day.  Neuropathy is currently resolved with this.     PLAN: #1  Patient is doing well today.  I reviewed her labs with her in detail.  She will proceed with treatment today.   #2 Per Dr. Grant Ruts she will continue with therapy and we will continue to follow her CA-125 as the increase is minimal.    #3 She will return to [place] on 01/15/13 for labs, an appointment, and Topotecan.   #4 I prescribed a Zpak for likely sinusitis.  She will also resume with saline nasal spray BID.    All questions were answered.  Patient was encouraged to contact us with any  questions, problems or concerns.   I spent 25 minutes counseling the patient face to face.  The total time spent in the appointment was 30 minutes.  Illa Level, NP Medical Oncology [facility] [phone] 01/03/2013  11:45 AM   ATTENDING'S ATTESTATION:  I personally reviewed patient's chart, examined patient myself, formulated the treatment plan as followed.     67 year old female withhistory of ductal carcinoma in situ. Most recently in November 2013 patient was diagnosed with metastatic ovarian carcinoma. She is currently receiving Avastin and topotecan. [name] tolerating it well she seems to be doing stable, disease perspective.she will proceed with her scheduled chemotherapy today.  Drue Second, MD Medical/Oncology [facility] [phone] (beeper) [phone] (Office)  01/21/2013, 10:16 PM

## 2013-01-01 NOTE — Patient Instructions (Signed)
[facility] Discharge Instructions for Patients Receiving Chemotherapy  Today you received the following chemotherapy agents Avastin/Topotecan.  To help prevent nausea and vomiting after your treatment, we encourage you to take your nausea medication as prescribed.   If you develop nausea and vomiting that is not controlled by your nausea medication, call the clinic.   BELOW ARE SYMPTOMS THAT SHOULD BE REPORTED IMMEDIATELY:  *FEVER GREATER THAN 100.5 F  *CHILLS WITH OR WITHOUT FEVER  NAUSEA AND VOMITING THAT IS NOT CONTROLLED WITH YOUR NAUSEA MEDICATION  *UNUSUAL SHORTNESS OF BREATH  *UNUSUAL BRUISING OR BLEEDING  TENDERNESS IN MOUTH AND THROAT WITH OR WITHOUT PRESENCE OF ULCERS  *URINARY PROBLEMS  *BOWEL PROBLEMS  UNUSUAL RASH Items with * indicate a potential emergency and should be followed up as soon as possible.  Feel free to call the clinic you have any questions or concerns. The clinic phone number is [phone].

## 2013-01-15 ENCOUNTER — Ambulatory Visit (HOSPITAL_BASED_OUTPATIENT_CLINIC_OR_DEPARTMENT_OTHER): Payer: Medicare Other

## 2013-01-15 ENCOUNTER — Ambulatory Visit (HOSPITAL_BASED_OUTPATIENT_CLINIC_OR_DEPARTMENT_OTHER): Payer: Medicare Other | Admitting: Lab

## 2013-01-15 ENCOUNTER — Ambulatory Visit (HOSPITAL_BASED_OUTPATIENT_CLINIC_OR_DEPARTMENT_OTHER): Payer: Medicare Other | Admitting: Adult Health

## 2013-01-15 ENCOUNTER — Encounter: Payer: Self-pay | Admitting: Adult Health

## 2013-01-15 ENCOUNTER — Other Ambulatory Visit: Payer: Self-pay | Admitting: Oncology

## 2013-01-15 VITALS — BP 128/76 | HR 90 | Temp 98.2°F | Resp 18 | Ht 66.0 in | Wt 164.2 lb

## 2013-01-15 DIAGNOSIS — Z170 Estrogen receptor positive status [ER+]: Secondary | ICD-10-CM

## 2013-01-15 DIAGNOSIS — C569 Malignant neoplasm of unspecified ovary: Secondary | ICD-10-CM

## 2013-01-15 DIAGNOSIS — B009 Herpesviral infection, unspecified: Secondary | ICD-10-CM

## 2013-01-15 DIAGNOSIS — F341 Dysthymic disorder: Secondary | ICD-10-CM

## 2013-01-15 DIAGNOSIS — R0602 Shortness of breath: Secondary | ICD-10-CM

## 2013-01-15 DIAGNOSIS — Z5111 Encounter for antineoplastic chemotherapy: Secondary | ICD-10-CM

## 2013-01-15 DIAGNOSIS — D0590 Unspecified type of carcinoma in situ of unspecified breast: Secondary | ICD-10-CM

## 2013-01-15 DIAGNOSIS — C786 Secondary malignant neoplasm of retroperitoneum and peritoneum: Secondary | ICD-10-CM

## 2013-01-15 LAB — COMPREHENSIVE METABOLIC PANEL (CC13)
ALT: 36 U/L (ref 0–55)
Alkaline Phosphatase: 86 U/L (ref 40–150)
CO2: 22 mEq/L (ref 22–29)
Creatinine: 0.8 mg/dL (ref 0.6–1.1)
Potassium: 4 mEq/L (ref 3.5–5.1)
Sodium: 142 mEq/L (ref 136–145)
Total Bilirubin: 0.36 mg/dL (ref 0.20–1.20)
Total Protein: 6.5 g/dL (ref 6.4–8.3)

## 2013-01-15 LAB — CBC WITH DIFFERENTIAL/PLATELET
BASO%: 0.9 % (ref 0.0–2.0)
EOS%: 1.9 % (ref 0.0–7.0)
Eosinophils Absolute: 0.1 10*3/uL (ref 0.0–0.5)
LYMPH%: 38.7 % (ref 14.0–49.7)
MCH: 30.8 pg (ref 25.1–34.0)
MCHC: 30.5 g/dL — ABNORMAL LOW (ref 31.5–36.0)
MCV: 101 fL (ref 79.5–101.0)
MONO#: 1 10*3/uL — ABNORMAL HIGH (ref 0.1–0.9)
MONO%: 22 % — ABNORMAL HIGH (ref 0.0–14.0)
Platelets: 330 10*3/uL (ref 145–400)
RBC: 3.05 10*6/uL — ABNORMAL LOW (ref 3.70–5.45)
RDW: 25 % — ABNORMAL HIGH (ref 11.2–14.5)
WBC: 4.6 10*3/uL (ref 3.9–10.3)

## 2013-01-15 MED ORDER — SODIUM CHLORIDE 0.9 % IJ SOLN
10.0000 mL | INTRAMUSCULAR | Status: DC | PRN
  Administered 2013-01-15: 10 mL
  Filled 2013-01-15: qty 10

## 2013-01-15 MED ORDER — DEXAMETHASONE SODIUM PHOSPHATE 10 MG/ML IJ SOLN
INTRAMUSCULAR | Status: AC
  Filled 2013-01-15: qty 1

## 2013-01-15 MED ORDER — SODIUM CHLORIDE 0.9 % IV SOLN
Freq: Once | INTRAVENOUS | Status: AC
  Administered 2013-01-15: 16:00:00 via INTRAVENOUS

## 2013-01-15 MED ORDER — ONDANSETRON 8 MG/NS 50 ML IVPB
INTRAVENOUS | Status: AC
  Filled 2013-01-15: qty 8

## 2013-01-15 MED ORDER — HEPARIN SOD (PORK) LOCK FLUSH 100 UNIT/ML IV SOLN
500.0000 [IU] | Freq: Once | INTRAVENOUS | Status: AC | PRN
  Administered 2013-01-15: 500 [IU]
  Filled 2013-01-15: qty 5

## 2013-01-15 MED ORDER — ONDANSETRON 8 MG/50ML IVPB (CHCC)
8.0000 mg | Freq: Once | INTRAVENOUS | Status: AC
  Administered 2013-01-15: 8 mg via INTRAVENOUS

## 2013-01-15 MED ORDER — TOPOTECAN HCL CHEMO INJECTION 4 MG
3.9000 mg/m2 | Freq: Once | INTRAVENOUS | Status: AC
  Administered 2013-01-15: 7 mg via INTRAVENOUS
  Filled 2013-01-15: qty 7

## 2013-01-15 MED ORDER — DEXAMETHASONE SODIUM PHOSPHATE 10 MG/ML IJ SOLN
10.0000 mg | Freq: Once | INTRAMUSCULAR | Status: AC
  Administered 2013-01-15: 10 mg via INTRAVENOUS

## 2013-01-15 NOTE — Patient Instructions (Signed)
[facility] Discharge Instructions for Patients Receiving Chemotherapy  Today you received the following chemotherapy agents: Topetecan  To help prevent nausea and vomiting after your treatment, we encourage you to take your nausea medication.   If you develop nausea and vomiting that is not controlled by your nausea medication, call the clinic.   BELOW ARE SYMPTOMS THAT SHOULD BE REPORTED IMMEDIATELY:  *FEVER GREATER THAN 100.5 F  *CHILLS WITH OR WITHOUT FEVER  NAUSEA AND VOMITING THAT IS NOT CONTROLLED WITH YOUR NAUSEA MEDICATION  *UNUSUAL SHORTNESS OF BREATH  *UNUSUAL BRUISING OR BLEEDING  TENDERNESS IN MOUTH AND THROAT WITH OR WITHOUT PRESENCE OF ULCERS  *URINARY PROBLEMS  *BOWEL PROBLEMS  UNUSUAL RASH Items with * indicate a potential emergency and should be followed up as soon as possible.  Feel free to call the clinic you have any questions or concerns. The clinic phone number is [phone].

## 2013-01-16 ENCOUNTER — Encounter: Payer: Self-pay | Admitting: Internal Medicine

## 2013-01-16 ENCOUNTER — Ambulatory Visit (INDEPENDENT_AMBULATORY_CARE_PROVIDER_SITE_OTHER): Payer: Medicare Other | Admitting: Internal Medicine

## 2013-01-16 VITALS — BP 126/82 | HR 70 | Temp 98.1°F | Ht 67.0 in | Wt 166.0 lb

## 2013-01-16 DIAGNOSIS — R0609 Other forms of dyspnea: Secondary | ICD-10-CM

## 2013-01-16 DIAGNOSIS — R0600 Dyspnea, unspecified: Secondary | ICD-10-CM

## 2013-01-16 NOTE — Progress Notes (Signed)
[facility]  [phone] [phone]  OFFICE PROGRESS NOTE   PCP: Elesa Massed, M.D. SU: Emelia Loron, M.D. RAD ONC: Antony Blackbird, M.D.   DIAGNOSIS: [name] [name] is a 67 year-old female with a history of ductal carcinoma in situ of the left breast diagnosed 04/2009 and ovarian cancer diagnosed in 12/2011.   PRIOR THERAPY: #1. 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 Patient had 3 sentinel biopsied all of them were negative for metastatic disease. Patient underwent radiation therapy between 06/05/2009 through 07/02/2009. She was then begun on Tamoxifen 20 mg daily in 09/2009.  #2  In 12/2011 she received the diagnosis of gynecologic malignancy presenting with abdominal mass and pleural effusions. Patient was originally hospitalized with shortness of breath in 12/2011.  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 She also had malignant ascites.  She underwent bilateral thoracentesis and paracentesis procedures during her hospitalization. During her hospitalization she was seen by gynecologic oncology.  #3 Patient began neoadjuvant chemotherapy consisting of Taxol and Carboplatinum. Her first cycle was administered during her hospitalization. She has completed a total of 6 cycles of Taxol/Carboplatinum from 01/07/2012 - 06/23/2012. Overall she tolerated it well except for some fatigue and neuropathies.  #4 Patient is status post laparotomy in 05/2012 that revealed significant residual disease.   #5  Patient began adjuvant therapy with Topotecan/Avastin starting on 07/24/2012.  CURRENT THERAPY: Topotecan/Avastin   INTERVAL HISTORY:  [name] [name] 66 y.o. female returns today for evaluation prior to receiving treatment with Topotecan.  She is doing well today.  She continues to have shortness of  breath with exertion and it is now impacting her quality of life.  Her last echo was in February 2014.  She has f/u with cardiology this week.  Otherwise,a 10 point ROS is neg.      MEDICAL HISTORY: Past Medical History  Diagnosis Date  . Interstitial cystitis     on chronic antibiotics  . Hyperlipidemia   . Frequent UTI     on prophylaxis  . 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 CAD (coronary artery disease)     a. s/p CABG 7/13;   b.  LHC 12/01/11:  pLAD 70%, mLAD 40%, CFX 40-50% prior to takeoff of the OM2, oRCA occluded, mid vessel filled via R->R collaterals and distal vessel filled by L->R collaterals, S-OM1/OM2 (small and diffusely dz) with mid 90% stenosis, 90% at OM1 anastomotic site, continuation of OM2 occluded, S-Dx patent, S-PDA occluded, L-LAD ok, EF 55-65%  => Med Rx rec.  [name] Hx of echocardiogram     Echo 5/13: EF 60-65%, grade 2 diastolic dysfunction  . Hypercholesterolemia   . Pleural effusion 12/28/11    s/p pleurx catheter  . GERD (gastroesophageal reflux disease)   . Arthritis     "just a little; lower back" (12/28/11)  . Gout attack 08/2011    related to "stress post OHS"  . Depression   . Breast cancer     "left"; on Tamoxifen  . S/P thoracentesis 12/28/11    "for pleural effusion" (12/28/2011)  . Ovarian cancer   . Tachycardia   . Neuromuscular disorder     peripheral neuropathy    ALLERGIES:  is allergic to codeine; isosorbide; other; phenothiazines; talwin; yellow jacket venom; ambien; and tegaderm ag mesh.  MEDICATIONS:  Current Outpatient Prescriptions  Medication Sig Dispense Refill  .  ALPRAZolam (XANAX) 0.25 MG tablet Take 1 tablet (0.25 mg total) by mouth every 8 (eight) hours as needed for anxiety.  30 tablet  4  . Alum & Mag Hydroxide-Simeth (MAGIC MOUTHWASH) SOLN Take 5 mLs by mouth 4 (four) times daily.       [name] aspirin 325 MG tablet Take 325 mg by mouth at bedtime.       [name] atorvastatin (LIPITOR) 20 MG tablet TAKE ONE TABLET BY MOUTH ONCE DAILY  90  tablet  3  . carvedilol (COREG) 25 MG tablet Take 1 tablet (25 mg total) by mouth 2 (two) times daily.  180 tablet  3  . dexamethasone (DECADRON) 4 MG tablet Take 2 tablets (8 mg total) by mouth 2 (two) times daily with a meal. Take daily starting the day after chemotherapy for 2 days. Take with food.  30 tablet  1  . docusate sodium (COLACE) 100 MG capsule Take 200 mg by mouth daily.      [name] EPINEPHrine (EPIPEN 2-PAK) 0.3 mg/0.3 mL DEVI Inject 0.3 mg into the muscle daily as needed (allergic reaction).       . famotidine (PEPCID) 20 MG tablet Take 20 mg by mouth. Take 1 tablet at bedtime by mouth      . fluticasone (FLONASE) 50 MCG/ACT nasal spray Place 2 sprays into the nose daily as needed for allergies.      [name] gabapentin (NEURONTIN) 100 MG capsule Take 100 mg orally three times a day  90 capsule  6  . HYDROcodone-acetaminophen (NORCO/VICODIN) 5-325 MG per tablet Take 1-2 tablets by mouth every 4 (four) hours as needed.  40 tablet  2  . lidocaine-prilocaine (EMLA) cream Apply topically as needed.  30 g  1  . loratadine (CLARITIN) 10 MG tablet Take 10 mg by mouth. Take 1 tablet daily as needed by mouth      . nitroGLYCERIN (NITROSTAT) 0.4 MG SL tablet Place 0.4 mg under the tongue every 5 (five) minutes as needed. Chest pain      . omeprazole (PRILOSEC) 20 MG capsule Take 40 mg by mouth daily.      . ondansetron (ZOFRAN) 8 MG tablet Take 1 tablet (8 mg total) by mouth 2 (two) times daily. Take two times a day starting the day after chemo for 2 days. Then take two times a day as needed for nausea or vomiting.  30 tablet  1  . oxybutynin (DITROPAN-XL) 10 MG 24 hr tablet Take 10 mg by mouth daily.      Bertram Gala Glycol-Propyl Glycol 0.4-0.3 % SOLN Place 1 drop into both eyes every morning. As needed      . senna (SENOKOT) 8.6 MG tablet Take 1 tablet by mouth daily as needed for constipation.      . SF 5000 PLUS 1.1 % CREA dental cream Place 1.1 % onto teeth at bedtime.       [name] trimethoprim  (TRIMPEX) 100 MG tablet Take 100 mg by mouth every morning.       . valACYclovir (VALTREX) 500 MG tablet Take 1 tablet (500 mg total) by mouth daily.  30 tablet  7  . venlafaxine XR (EFFEXOR-XR) 75 MG 24 hr capsule Take 75 mg  Orally daily  30 capsule  8  . prochlorperazine (COMPAZINE) 25 MG suppository Place 1 suppository (25 mg total) rectally every 12 (twelve) hours as needed for nausea.  12 suppository  3   No current facility-administered medications for this visit.   Facility-Administered Medications  Ordered in Other Visits  Medication Dose Route Frequency Provider Last Rate Last Dose  . influenza  inactive virus vaccine (FLUZONE/FLUARIX) injection 0.5 mL  0.5 mL Intramuscular Once Joselyn Arrow, MD        SURGICAL HISTORY:  Past Surgical History  Procedure Laterality Date  . Breast lumpectomy  04/2009    left  . Cesarean section  1973; 1976  . Muscle release  1960    L neck and chest.; "when I was 13; pneumonia settled in my left neck"  . Coronary artery bypass graft  08/18/2011    Procedure: CORONARY ARTERY BYPASS GRAFTING (CABG);  Surgeon: Alleen Borne, MD;  Location: [facility] OR;  Service: Open Heart Surgery;  Laterality: N/A;  Coronary Artery Bypass Graft times five utilizing the left internal mammary artery and the left greater saphenous vein harvested endoscopically.  . Abdominal hysterectomy  1976  . Appendectomy  1976  . Portacath placement  01/06/2012    Procedure: INSERTION PORT-A-CATH;  Surgeon: Alleen Borne, MD;  Location: [facility] OR;  Service: Thoracic;  Laterality: Left;  . Chest tube insertion  01/06/2012    Procedure: INSERTION PLEURAL DRAINAGE CATHETER;  Surgeon: Alleen Borne, MD;  Location: [facility] OR;  Service: Thoracic;  Laterality: Bilateral;  . Removal of pleural drainage catheter Right 04/12/2012    Procedure: MINOR REMOVAL OF PLEURAL DRAINAGE CATHETER;  Surgeon: Alleen Borne, MD;  Location: [facility] OR;  Service: Thoracic;  Laterality: Right;  . Talc pleurodesis Left 04/12/2012     Procedure: Lurlean Nanny;  Surgeon: Alleen Borne, MD;  Location: [facility] OR;  Service: Thoracic;  Laterality: Left;  . Portacath placement Left 04/17/2012    Procedure: INSERTION PORT-A-CATH;  Surgeon: Alleen Borne, MD;  Location: [facility] OR;  Service: Thoracic;  Laterality: Left;  . Removal of pleural drainage catheter Left 04/17/2012    Procedure: REMOVAL OF PLEURAL DRAINAGE CATHETER;  Surgeon: Alleen Borne, MD;  Location: [facility] OR;  Service: Thoracic;  Laterality: Left;  . Port-a-cath removal Left 04/17/2012    Procedure: REMOVAL PORT-A-CATH;  Surgeon: Alleen Borne, MD;  Location: [facility] OR;  Service: Thoracic;  Laterality: Left;  . Cardiac catheterization    . Laparotomy Bilateral 05/30/2012    Procedure: Resection of umbilical mass, Partial omentectomy;  Surgeon: Jeannette Corpus, MD;  Location: [facility] ORS;  Service: Gynecology;  Laterality: Bilateral;    REVIEW OF SYSTEMS:   A 10 point review of systems was completed and is negative except as noted above.  PHYSICAL EXAMINATION:  BP 128/76  Pulse 90  Temp(Src) 98.2 F (36.8 C) (Oral)  Resp 18  Ht 5\' 6"  (1.676 m)  Wt 164 lb 3.2 oz (74.481 kg)  BMI 26.52 kg/m2  General: Patient is a well appearing female in no acute distress HEENT: PERRLA, sclerae anicteric, conjunctival pallor, MMM Neck: supple, no palpable adenopathy Lungs: clear to auscultation bilaterally, no wheezes, rhonchi, or rales Cardiovascular: regular rate rhythm, S1, S2, no murmurs, rubs or gallops Abdomen: Soft, non-tender, non-distended, normoactive bowel sounds, no HSM Extremities: warm and well perfused, no clubbing, cyanosis, or edema Skin: No rashes or lesions Neuro: Non-focal ECOG PERFORMANCE STATUS: 1 - Symptomatic but completely ambulatory.   LABORATORY DATA: Lab Results  Component Value Date   WBC 4.6 01/15/2013   HGB 9.4* 01/15/2013   HCT 30.8* 01/15/2013   MCV 101.0 01/15/2013   PLT 330 01/15/2013      Chemistry      Component Value Date/Time   NA 142  01/15/2013 1357   NA 134* 06/02/2012 0515   NA 141 12/17/2011 1144   K 4.0 01/15/2013 1357   K 4.4 06/02/2012 0515   CL 103 08/07/2012 1108   CL 100 06/02/2012 0515   CO2 22 01/15/2013 1357   CO2 25 06/02/2012 0515   BUN 12.9 01/15/2013 1357   BUN 20 06/02/2012 0515   BUN 15 12/17/2011 1144   CREATININE 0.8 01/15/2013 1357   CREATININE 0.76 06/02/2012 0515      Component Value Date/Time   CALCIUM 8.8 01/15/2013 1357   CALCIUM 8.7 06/02/2012 0515   ALKPHOS 86 01/15/2013 1357   ALKPHOS 85 05/26/2012 0955   AST 29 01/15/2013 1357   AST 22 05/26/2012 0955   ALT 36 01/15/2013 1357   ALT 16 05/26/2012 0955   BILITOT 0.36 01/15/2013 1357   BILITOT 0.3 05/26/2012 0955       RADIOGRAPHIC STUDIES: Dg Chest 2 View 08/30/2012   *RADIOLOGY REPORT*  Clinical Data: Shortness of breath, history metastatic ovarian carcinoma and history of bilateral pleural effusions.  CHEST - 2 VIEW  Comparison: 05/26/2012  Findings: There is significant diminishment in bilateral pleural effusions since the prior study with only trace amount of pleural fluid present bilaterally.  The lungs show no evidence of focal consolidation, masses or pulmonary edema.  Port-A-Cath positioning stable in the SVC.  The heart size is normal.  No bony lesions are seen.  IMPRESSION: Significant diminishment in bilateral pleural effusions since the prior chest x-ray. Trace bilateral pleural effusions remain.  No acute findings.   Original Report Authenticated By: Irish Lack, M.D.     ASSESSMENT: 67 year old woman:   #1 History of DCIS of the left breast status post lumpectomy in 04/2009.  Status post radiation therapy completed in 06/2009.  Was on antiestrogen therapy with Tamoxifen 20 mg by mouth daily that was started in 09/2009.  Tamoxifen has been discontinued for now due to her recent diagnosis of ovarian cancer.  #2 Gynecologic malignancy (ovarian carcinoma) diagnosed in 12/2011. Patient received 6 cycles of neoadjuvant chemotherapy consisting  of Taxol/Carboplatinum completed in 06/2012.  #3 She is status post laparotomy performed on 05/30/2012. Unfortunately, intraoperatively she was found to have significant residual disease. 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   #4 Patient completed chemotherapy sensitivity testing. Unfortunately the results were inconclusive.  It was decided to proceed with chemotherapy consisting of Topotecan and Avastin.  Dr. Grant Ruts also recommended this treatment regimen.  #5 Patient was started chemotherapy consisting of Topotecan and Avastin  With Topotecan q week on day 1, 8,15 on a 28 day cycle, and Avastin scheduled for every 3 weeks.  #6 Depression/anxiety: Currently on Effexor with good response  #7 Shortness of breath: patient has been seen by cardilogist Dr. Elease Hashimoto and cardiac etiology was ruled out. Patient followed by Dr. Sherene Sires, last appt on 11/23/12 and he recommended a re-conditioning and exercise plan along with dietary modifications.   #8 History of dehydration -patient instructed to drink plenty of water  #9  Cold sores/herpes labialis.  #10 Neuropathy - currently on Gabapentin 100 mg by mouth 3 times a day.  Neuropathy is currently resolved with this.     PLAN: #1  Patient is doing well today.  I reviewed her labs with her in detail.  She will proceed with treatment today. Due to the shortness of breath and her unhappiness with the increasing impact on her quality  of life, I ordered an echo, she will f/u with cardiology as scheduled this week, and I requested urgent f/u with Dr. Sherene Sires as well.    #2 She will return to [place] in one week for labs/eval, and chemotherapy.   All questions were answered.  Patient was encouraged to contact us with any questions, problems or concerns.   I spent 25 minutes counseling the patient face to face.  The total time spent in the appointment was 30 minutes.  Illa Level, NP Medical Oncology [facility] [phone] 01/17/2013  10:24 AM

## 2013-01-16 NOTE — Progress Notes (Signed)
Subjective:    Patient ID: [name] [name], female    DOB: [birth year]  MRN: [id number]  Brief patient profile:   27 yowf quit smoking 02/2001 with ex cp 2013 > CABG July 2013 then progressive sob dx with  ovarian ca with bilateral pl effusions 12/2011 required pleurovac on R and VATs/ pleurodesis on L completed Jan 2014 then chemo and did better until around early Aug 2014 with recurrent doe dx with pleural effusions referred 10/31/2012 to pulmonary clinic by Dr Park Breed   History of Present Illness   10/31/2012 f/u ov/Wert cc variable sob x 6 weeks better if lie down in recliner at 30 degreee,assoc with pnds that is new and sometimes yellow and bloody, worse first thing in am. Sob can be just as bad at rest as with ex, brought on by talking. rec If the wheezing returns we will need to consider replacing the corevidol Try prilosec 20mg   Take 30-60 min before first meal of the day and Pepcid 20 mg one bedtime until return Please see patient coordinator before you leave today  to schedule sinus CT GERD diet   11/23/2012 f/u ov/Wert re: better p zpak for "sinus" p trip  To [place] on plane Chief Complaint  Patient presents with  . Follow-up    had pft yesterday-doing better,sob same,gasping has calmed down,cough-dry,denies wheezing,when sneezes hurts on both sides   sob x exertion =  Like what ?  A:  across parking lot (much better than prev ov but still present on day of ov) rec To get the most out of exercise, you need to be continuously aware that you are short of breath, but never out of breath, for 30 minutes daily. As you improve, it will actually be easier for you to do the same amount of exercise  in  30 minutes so always push to the level where you are short of breath.     01/16/2013 f/u ov/Wert re: unexplained doe  Chief Complaint  Patient presents with  . Acute Visit    Pt c/o increased SOB for the past month.  She states that she gets out of breath with just walking across the  room.    variable sob : At worst across the room At best = [employer] leaning on cart but gets left off at curb more tired than tachypneic  Never sob at rest and never lying down    No obvious day to day or daytime variabilty or assoc chronic cough or cp or chest tightness, subjective wheeze overt sinus or hb symptoms. No unusual exp hx or h/o childhood pna/ asthma or knowledge of premature birth.  Sleeping ok without nocturnal  or early am exacerbation  of respiratory  c/o's or need for noct saba. Also denies any obvious fluctuation of symptoms with weather or environmental changes or other aggravating or alleviating factors except as outlined above   Current Medications, Allergies, Complete Past Medical History, Past Surgical History, Family History, and Social History were reviewed in [employer] record.  ROS  The following are not active complaints unless bolded sore throat, dysphagia, dental problems, itching, sneezing,  nasal congestion or excess/ purulent secretions, ear ache,   fever, chills, sweats, unintended wt loss, pleuritic or exertional cp, hemoptysis,  orthopnea pnd or leg swelling, presyncope, palpitations, heartburn, abdominal pain, anorexia, nausea, vomiting, diarrhea  or change in bowel or urinary habits, change in stools or urine, dysuria,hematuria,  rash, arthralgias, visual complaints, headache, numbness weakness or ataxia or  problems with walking or coordination,  change in mood/affect or memory.                Objective:   Physical Exam   11/23/2012        160  Vs 01/16/2013  166  Wt Readings from Last 3 Encounters:  10/31/12 154 lb 12.8 oz (70.217 kg)  10/30/12 153 lb 6.4 oz (69.582 kg)  10/23/12 153 lb 8 oz (69.627 kg)    Pleasant amb talkative wf who prefers to answer all questions with medical terminology  HEENT: nl dentition, turbinates, and orophanx. Nl external ear canals without cough reflex   NECK :  without JVD/Nodes/TM/ nl  carotid upstrokes bilaterally   LUNGS: no acc muscle use, clear to A and P bilaterally without cough on insp or exp maneuvers   CV:  RRR  no s3 or murmur or increase in P2, no edema   ABD:  soft and nontender with nl excursion in the supine position. No bruits or organomegaly, bowel sounds nl  MS:  warm without deformities, calf tenderness, cyanosis or clubbing  SKIN: warm and dry without lesions    NEURO:  alert, approp, no deficits     11/24/12 1. Slight interval increase in size of hypermetabolic nodule along  the left aspect of the mediastinum, compatible with metastatic  disease.  2. Persistent small left pleural effusions. Additionally there is  focal consolidative opacity adjacent to the left talc pleurodesis  which is favored to represent focal atelectasis       Assessment & Plan:

## 2013-01-16 NOTE — Patient Instructions (Addendum)
Continue try prilosec 40mg  Take 30-60 min before first meal of the day and Pepcid 20 mg one bedtime  Walk up 30 min daily - pace yourself  I will recommend to Dr Melburn Popper to consider alternative to coreg (bisoprolol) and cpst to sort out the cause of your breathlessness

## 2013-01-17 ENCOUNTER — Ambulatory Visit (HOSPITAL_COMMUNITY)
Admission: RE | Admit: 2013-01-17 | Discharge: 2013-01-17 | Disposition: A | Discharge status: Home / Self Care | Payer: Medicare Other | Source: Ambulatory Visit | Attending: Adult Health | Admitting: Adult Health

## 2013-01-17 DIAGNOSIS — Z8543 Personal history of malignant neoplasm of ovary: Secondary | ICD-10-CM | POA: Insufficient documentation

## 2013-01-17 DIAGNOSIS — I10 Essential (primary) hypertension: Secondary | ICD-10-CM | POA: Insufficient documentation

## 2013-01-17 DIAGNOSIS — Z951 Presence of aortocoronary bypass graft: Secondary | ICD-10-CM | POA: Insufficient documentation

## 2013-01-17 DIAGNOSIS — R0609 Other forms of dyspnea: Secondary | ICD-10-CM | POA: Insufficient documentation

## 2013-01-17 DIAGNOSIS — I2510 Atherosclerotic heart disease of native coronary artery without angina pectoris: Secondary | ICD-10-CM | POA: Insufficient documentation

## 2013-01-17 DIAGNOSIS — R0989 Other specified symptoms and signs involving the circulatory and respiratory systems: Secondary | ICD-10-CM | POA: Insufficient documentation

## 2013-01-17 DIAGNOSIS — I059 Rheumatic mitral valve disease, unspecified: Secondary | ICD-10-CM | POA: Insufficient documentation

## 2013-01-17 DIAGNOSIS — Z853 Personal history of malignant neoplasm of breast: Secondary | ICD-10-CM | POA: Insufficient documentation

## 2013-01-17 DIAGNOSIS — I079 Rheumatic tricuspid valve disease, unspecified: Secondary | ICD-10-CM | POA: Insufficient documentation

## 2013-01-17 DIAGNOSIS — E785 Hyperlipidemia, unspecified: Secondary | ICD-10-CM | POA: Insufficient documentation

## 2013-01-17 NOTE — Assessment & Plan Note (Addendum)
-   10/31/2012  Walked RA x 3 laps @ 185 ft each stopped due to  End of study, no sob, no tachycardia nor desats - 11/22/2012 PFT's FEV1  2.04 ( 79%) ratio 85 and no change p B2 and DLCO 50 corrects to 67% - 11/23/2012  Walked RA x 3 laps @ 185 ft each stopped due to end of study , no desats - 01/16/2013  Walked RA  2 laps @ 185 ft each stopped due to  Sob, no desats  Symptoms are markedly disproportionate to objective findings and not clear this is a lung problem but pt does appear to have difficult airway management issues. DDX of  difficult airways managment all start with A and  include Adherence, Ace Inhibitors, Acid Reflux, Active Sinus Disease, Alpha 1 Antitripsin deficiency, Anxiety masquerading as Airways dz,  ABPA,  allergy(esp in young), Aspiration (esp in elderly), Adverse effects of DPI,  Active smokers, plus two Bs  = Bronchiectasis and Beta blocker use..and one C= CHF    The variable nature of doe is not c/w recurrent pleural effusions pe or progressive ca and is much more likely deconditioning and anxiety perhaps exacerbated by high doses of coreg which cause her legs to give out before her breathing but could also contribute to an asthmatic component   rec reconditioning exercises/ try substitute for coreg (? Bisoprolol) per Dr [place] then cpst with spirometry before and after looking for EIA  See instructions for specific recommendations which were reviewed directly with the patient who was given a copy with highlighter outlining the key components.

## 2013-01-17 NOTE — Progress Notes (Signed)
  Echocardiogram 2D Echocardiogram has been performed.  [name] [name] 01/17/2013, 10:07 AM

## 2013-01-18 ENCOUNTER — Ambulatory Visit (INDEPENDENT_AMBULATORY_CARE_PROVIDER_SITE_OTHER): Payer: Medicare Other | Admitting: Cardiovascular Disease

## 2013-01-18 ENCOUNTER — Encounter: Payer: Self-pay | Admitting: Cardiovascular Disease

## 2013-01-18 VITALS — BP 150/90 | HR 68 | Ht 67.0 in | Wt 163.4 lb

## 2013-01-18 DIAGNOSIS — R0609 Other forms of dyspnea: Secondary | ICD-10-CM

## 2013-01-18 DIAGNOSIS — R0600 Dyspnea, unspecified: Secondary | ICD-10-CM

## 2013-01-18 MED ORDER — BISOPROLOL FUMARATE 10 MG PO TABS: 10.0000 mg | ORAL_TABLET | Freq: Every day | ORAL | Status: DC

## 2013-01-18 NOTE — Progress Notes (Signed)
Yisroel Ramming Date of Birth  08/29/1945       [facility]    [place] [address], [address]  [address], [address] [place], [place]  [address]   [place], [place]  [address] [phone]     [phone]   Fax  [phone]    Fax [phone]  Problem List: 1. CAD- CABG 2. Breast cancer - on Tamoxifin  3. Hyperlipidemia  4. Interstitial cystitis  History of Present Illness:  Kathie Rhodes has not been doing very well.  She presented with CP and cardiac cath revealed that the saphenous vein graft to the left circumflex artery was severely diseased. Her native circumflex artery has only minor luminal irregularities. The saphenous vein graft to right coronary artery was also found to be occluded and her native right coronary artery is chronically occluded.  Since that time she's been tried on medical therapy.  We doubled her metoprol and added isosorbide.. This has caused profound fatigue. She also has had severe shortness of breath, and severe headache. She has stopped the isosorbide ( because of the headache).  She also has been having lots of acid reflux and a chronic cough.  She has noted exertional dyspnea, and cough.  April 18, 2012:  That he has had a rough time since I last saw her. She has been receiving chemotherapy for ovarian cancer. She has had bilateral Pleurx tubes for chronic pleural effusions appear she's had talc placement into her pleural space.  She has just completed her chemotherapy and is now scheduled have surgical debridement of her tumor and ovaries. We have obtained an echocardiogram which reveals normal left systolic function. [name] not having any episodes of chest pain or shortness breath. She is very fatigued and quite deconditioned.  July 19, 2012:  She has had abdomina surgery since I last saw her.  She did well from a cardiac standpoint.  She still has lots of tumors and will be starting another round of chemotherapy.    Sept. 4, 2014:  That he presents today as a work in visit for severe shortness of breath.  She has had severe dyspnea while walking - especially in the morning.    She is much better from a cancer standpoint.   . No cough, no fever, no orthopnea or PND.  No angina pain.    When she was originally diagnosed with ovarian cancer she was found to have large pleural effusions. She had pleuraldesis with talc powder to her left pleural space.   Her tumor markers are down to 30 (started at 300).    Dec. 4, 2014:  He presents today for evaluation of shortness of breath.    An echocardiogram performed at [facility]  reveals normal left ventricular systolic function. She was recently seen by Dr. Sherene Sires and he would like to try Bisoprolol instead of Coreg.    Current Outpatient Prescriptions on File Prior to Visit  Medication Sig Dispense Refill  . ALPRAZolam (XANAX) 0.25 MG tablet Take 1 tablet (0.25 mg total) by mouth every 8 (eight) hours as needed for anxiety.  30 tablet  4  . Alum & Mag Hydroxide-Simeth (MAGIC MOUTHWASH) SOLN Take 5 mLs by mouth 4 (four) times daily.       [name] aspirin 325 MG tablet Take 325 mg by mouth at bedtime.       [name] atorvastatin (LIPITOR) 20 MG tablet TAKE ONE TABLET BY MOUTH ONCE DAILY  90 tablet  3  .  carvedilol (COREG) 25 MG tablet Take 1 tablet (25 mg total) by mouth 2 (two) times daily.  180 tablet  3  . dexamethasone (DECADRON) 4 MG tablet Take 2 tablets (8 mg total) by mouth 2 (two) times daily with a meal. Take daily starting the day after chemotherapy for 2 days. Take with food.  30 tablet  1  . docusate sodium (COLACE) 100 MG capsule Take 200 mg by mouth daily.      [name] EPINEPHrine (EPIPEN 2-PAK) 0.3 mg/0.3 mL DEVI Inject 0.3 mg into the muscle daily as needed (allergic reaction).       . famotidine (PEPCID) 20 MG tablet Take 20 mg by mouth. Take 1 tablet at bedtime by mouth      . fluticasone (FLONASE) 50 MCG/ACT nasal spray Place 2 sprays into the nose daily as  needed for allergies.      [name] gabapentin (NEURONTIN) 100 MG capsule Take 100 mg orally three times a day  90 capsule  6  . HYDROcodone-acetaminophen (NORCO/VICODIN) 5-325 MG per tablet Take 1-2 tablets by mouth every 4 (four) hours as needed.  40 tablet  2  . lidocaine-prilocaine (EMLA) cream Apply topically as needed.  30 g  1  . loratadine (CLARITIN) 10 MG tablet Take 10 mg by mouth. Take 1 tablet daily as needed by mouth      . nitroGLYCERIN (NITROSTAT) 0.4 MG SL tablet Place 0.4 mg under the tongue every 5 (five) minutes as needed. Chest pain      . omeprazole (PRILOSEC) 20 MG capsule Take 40 mg by mouth daily.      . ondansetron (ZOFRAN) 8 MG tablet Take 1 tablet (8 mg total) by mouth 2 (two) times daily. Take two times a day starting the day after chemo for 2 days. Then take two times a day as needed for nausea or vomiting.  30 tablet  1  . oxybutynin (DITROPAN-XL) 10 MG 24 hr tablet Take 10 mg by mouth daily.      Bertram Gala Glycol-Propyl Glycol 0.4-0.3 % SOLN Place 1 drop into both eyes every morning. As needed      . prochlorperazine (COMPAZINE) 25 MG suppository Place 1 suppository (25 mg total) rectally every 12 (twelve) hours as needed for nausea.  12 suppository  3  . senna (SENOKOT) 8.6 MG tablet Take 1 tablet by mouth daily as needed for constipation.      . SF 5000 PLUS 1.1 % CREA dental cream Place 1.1 % onto teeth at bedtime.       [name] trimethoprim (TRIMPEX) 100 MG tablet Take 100 mg by mouth every morning.       . valACYclovir (VALTREX) 500 MG tablet Take 1 tablet (500 mg total) by mouth daily.  30 tablet  7  . venlafaxine XR (EFFEXOR-XR) 75 MG 24 hr capsule Take 75 mg  Orally daily  30 capsule  8   Current Facility-Administered Medications on File Prior to Visit  Medication Dose Route Frequency Provider Last Rate Last Dose  . influenza  inactive virus vaccine (FLUZONE/FLUARIX) injection 0.5 mL  0.5 mL Intramuscular Once Joselyn Arrow, MD        Allergies  Allergen Reactions  .  Codeine Nausea And Vomiting       . Isosorbide Other (See Comments)    Extreme headaches  . Other Swelling    All Lip moisturizers except Vaseline.  . Phenothiazines Other (See Comments)    Makes her stop breathing.  Durene Fruits [Pentazocine]  Nausea And Vomiting  . Yellow Jacket Venom Anaphylaxis  . Ambien [Zolpidem Tartrate]     Bad dreams  . Tegaderm Ag Mesh [Silver] Rash    USE ONLY OPSITE TO PAC    Past Medical History  Diagnosis Date  . Interstitial cystitis     on chronic antibiotics  . Hyperlipidemia   . Frequent UTI     on prophylaxis  . 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 CAD (coronary artery disease)     a. s/p CABG 7/13;   b.  LHC 12/01/11:  pLAD 70%, mLAD 40%, CFX 40-50% prior to takeoff of the OM2, oRCA occluded, mid vessel filled via R->R collaterals and distal vessel filled by L->R collaterals, S-OM1/OM2 (small and diffusely dz) with mid 90% stenosis, 90% at OM1 anastomotic site, continuation of OM2 occluded, S-Dx patent, S-PDA occluded, L-LAD ok, EF 55-65%  => Med Rx rec.  [name] Hx of echocardiogram     Echo 5/13: EF 60-65%, grade 2 diastolic dysfunction  . Hypercholesterolemia   . Pleural effusion 12/28/11    s/p pleurx catheter  . GERD (gastroesophageal reflux disease)   . Arthritis     "just a little; lower back" (12/28/11)  . Gout attack 08/2011    related to "stress post OHS"  . Depression   . Breast cancer     "left"; on Tamoxifen  . S/P thoracentesis 12/28/11    "for pleural effusion" (12/28/2011)  . Ovarian cancer   . Tachycardia   . Neuromuscular disorder     peripheral neuropathy    Past Surgical History  Procedure Laterality Date  . Breast lumpectomy  04/2009    left  . Cesarean section  1973; 1976  . Muscle release  1960    L neck and chest.; "when I was 13; pneumonia settled in my left neck"  . Coronary artery bypass graft  08/18/2011    Procedure: CORONARY ARTERY BYPASS GRAFTING (CABG);  Surgeon: Alleen Borne, MD;  Location: [facility] OR;  Service: Open  Heart Surgery;  Laterality: N/A;  Coronary Artery Bypass Graft times five utilizing the left internal mammary artery and the left greater saphenous vein harvested endoscopically.  . Abdominal hysterectomy  1976  . Appendectomy  1976  . Portacath placement  01/06/2012    Procedure: INSERTION PORT-A-CATH;  Surgeon: Alleen Borne, MD;  Location: [facility] OR;  Service: Thoracic;  Laterality: Left;  . Chest tube insertion  01/06/2012    Procedure: INSERTION PLEURAL DRAINAGE CATHETER;  Surgeon: Alleen Borne, MD;  Location: [facility] OR;  Service: Thoracic;  Laterality: Bilateral;  . Removal of pleural drainage catheter Right 04/12/2012    Procedure: MINOR REMOVAL OF PLEURAL DRAINAGE CATHETER;  Surgeon: Alleen Borne, MD;  Location: [facility] OR;  Service: Thoracic;  Laterality: Right;  . Talc pleurodesis Left 04/12/2012    Procedure: Lurlean Nanny;  Surgeon: Alleen Borne, MD;  Location: [facility] OR;  Service: Thoracic;  Laterality: Left;  . Portacath placement Left 04/17/2012    Procedure: INSERTION PORT-A-CATH;  Surgeon: Alleen Borne, MD;  Location: [facility] OR;  Service: Thoracic;  Laterality: Left;  . Removal of pleural drainage catheter Left 04/17/2012    Procedure: REMOVAL OF PLEURAL DRAINAGE CATHETER;  Surgeon: Alleen Borne, MD;  Location: [facility] OR;  Service: Thoracic;  Laterality: Left;  . Port-a-cath removal Left 04/17/2012    Procedure: REMOVAL PORT-A-CATH;  Surgeon: Alleen Borne, MD;  Location: [facility] OR;  Service: Thoracic;  Laterality: Left;  . Cardiac catheterization    .  Laparotomy Bilateral 05/30/2012    Procedure: Resection of umbilical mass, Partial omentectomy;  Surgeon: Jeannette Corpus, MD;  Location: [facility] ORS;  Service: Gynecology;  Laterality: Bilateral;    History  Smoking status  . Former Smoker -- 0.12 packs/day for 10 years  . Types: Cigarettes  . Quit date: 02/15/2001  Smokeless tobacco  . Never Used    History  Alcohol Use  . 0.0 oz/week    Comment: 12/28/11 "1 gin & tonic q hs",  2/5/141-2 glass a wine occa  05/26/12 none x 1 year    Family History  Problem Relation Age of Onset  . Cancer Mother 73    breast cancer  . Heart disease Father 43    MI at 28, CABG in 63's  . Hepatitis Father     C from blood transfusion  . Heart disease Brother     CABG in 68's  . Heart disease Paternal Aunt   . Heart disease Paternal Uncle   . Heart disease Paternal Grandfather   . Diabetes Neg Hx     Reviw of Systems:  Reviewed in the HPI.  All other systems are negative.  Physical Exam: Blood pressure 150/90, pulse 68, height 5\' 7"  (1.702 m), weight 163 lb 6.4 oz (74.118 kg). General: chronically ill appearing female, edematous, she is looking much better / stronger today.  She has re-grown some of her hair.   Head: Normocephalic, atraumatic, sclera non-icteric, mucus membranes are moist,   Neck: Supple. Carotids are 2 + without bruits. No JVD  Lungs: decreased breath sounds in the bases.  Right pleurex tube site has stitches.  The left pleurex site is bandaged.    Heart: regular rate.  normal  S1 S2. No murmurs, gallops or rubs.  Abdomen: mild distension  Msk:    Extremities: No clubbing or cyanosis. 1+ edema.  Distal pedal pulses are 2+ and equal bilaterally.  Neuro: Alert and oriented X 3. Moves all extremities spontaneously.  Psych:  Responds to questions appropriately with a normal affect.  ECG:  Assessment / Plan:

## 2013-01-18 NOTE — Assessment & Plan Note (Signed)
.   He has had some shortness of breath. She has seen Dr. Sherene Sires.  As per his recommendation, we will discontinue the carvedilol and start bisoprolol 10 mg a day. Her blood pressures little high. I've advised her to decrease her salt intake.  She has to drink lots of water because of some the chemotherapy agents that she's taking. We're both concerned that adding Lasix to her medical regimen made posterior to be more dehydrated than she already is. She has normal left ventricle systolic function and she does not appear to be volume overloaded today. I'll see her back in the office in 6 months for followup visit.

## 2013-01-18 NOTE — Patient Instructions (Signed)
Your physician has recommended you make the following change in your medication:  STOP COREG/ CARVEDILOL START BISOPROLOL 10 MG DAILY  REDUCE HIGH SODIUM FOODS LIKE CANNED SOUP, GRAVY, SAUCES, READY PREPARED FOODS LIKE FROZEN FOODS; LEAN CUISINE, LASAGNA. BACON, SAUSAGE, LUNCH MEAT, FAST FOODS, HOT DOGS, CHIPS, PIZZA, CHINESE FOOD, SOY SAUCE, STORE BOUGHT FRIED CHICKEN= [place] FRIED CHICKEN/ BOJANGLES.    CALL SOONER IF MEDICATION PROBLEM OR QUESTION / ASK FOR JODETTE RN IF YOU NEED TO BE SEEN SOONER.  Your physician wants you to follow-up in: 6 MONTHS  You will receive a reminder letter in the mail two months in advance. If you don't receive a letter, please call our office to schedule the follow-up appointment.

## 2013-01-22 ENCOUNTER — Ambulatory Visit (HOSPITAL_BASED_OUTPATIENT_CLINIC_OR_DEPARTMENT_OTHER): Payer: Medicare Other

## 2013-01-22 ENCOUNTER — Other Ambulatory Visit (HOSPITAL_BASED_OUTPATIENT_CLINIC_OR_DEPARTMENT_OTHER): Payer: Medicare Other | Admitting: Lab

## 2013-01-22 ENCOUNTER — Ambulatory Visit (HOSPITAL_BASED_OUTPATIENT_CLINIC_OR_DEPARTMENT_OTHER): Payer: Medicare Other | Admitting: Oncology

## 2013-01-22 ENCOUNTER — Encounter: Payer: Self-pay | Admitting: Oncology

## 2013-01-22 VITALS — BP 137/73 | HR 85 | Temp 97.9°F | Resp 18 | Ht 67.0 in | Wt 160.1 lb

## 2013-01-22 DIAGNOSIS — C569 Malignant neoplasm of unspecified ovary: Secondary | ICD-10-CM

## 2013-01-22 DIAGNOSIS — Z5112 Encounter for antineoplastic immunotherapy: Secondary | ICD-10-CM

## 2013-01-22 DIAGNOSIS — C786 Secondary malignant neoplasm of retroperitoneum and peritoneum: Secondary | ICD-10-CM

## 2013-01-22 DIAGNOSIS — F411 Generalized anxiety disorder: Secondary | ICD-10-CM

## 2013-01-22 DIAGNOSIS — Z5111 Encounter for antineoplastic chemotherapy: Secondary | ICD-10-CM

## 2013-01-22 DIAGNOSIS — Z853 Personal history of malignant neoplasm of breast: Secondary | ICD-10-CM

## 2013-01-22 LAB — CBC WITH DIFFERENTIAL/PLATELET
BASO%: 2.8 % — ABNORMAL HIGH (ref 0.0–2.0)
Eosinophils Absolute: 0.1 10*3/uL (ref 0.0–0.5)
HGB: 10.4 g/dL — ABNORMAL LOW (ref 11.6–15.9)
LYMPH%: 20.3 % (ref 14.0–49.7)
MCV: 100.6 fL (ref 79.5–101.0)
MONO#: 0.6 10*3/uL (ref 0.1–0.9)
NEUT#: 4.2 10*3/uL (ref 1.5–6.5)
Platelets: 416 10*3/uL — ABNORMAL HIGH (ref 145–400)
RBC: 3.34 10*6/uL — ABNORMAL LOW (ref 3.70–5.45)
RDW: 23.3 % — ABNORMAL HIGH (ref 11.2–14.5)
WBC: 6.3 10*3/uL (ref 3.9–10.3)
lymph#: 1.3 10*3/uL (ref 0.9–3.3)
nRBC: 10 % — ABNORMAL HIGH (ref 0–0)

## 2013-01-22 LAB — COMPREHENSIVE METABOLIC PANEL (CC13)
ALT: 40 U/L (ref 0–55)
AST: 28 U/L (ref 5–34)
Albumin: 3.3 g/dL — ABNORMAL LOW (ref 3.5–5.0)
Anion Gap: 11 mEq/L (ref 3–11)
CO2: 22 mEq/L (ref 22–29)
Calcium: 9.5 mg/dL (ref 8.4–10.4)
Chloride: 107 mEq/L (ref 98–109)
Potassium: 4.3 mEq/L (ref 3.5–5.1)
Sodium: 141 mEq/L (ref 136–145)
Total Bilirubin: 0.44 mg/dL (ref 0.20–1.20)
Total Protein: 6.6 g/dL (ref 6.4–8.3)

## 2013-01-22 MED ORDER — HEPARIN SOD (PORK) LOCK FLUSH 100 UNIT/ML IV SOLN
500.0000 [IU] | Freq: Once | INTRAVENOUS | Status: AC | PRN
  Administered 2013-01-22: 500 [IU]
  Filled 2013-01-22: qty 5

## 2013-01-22 MED ORDER — SODIUM CHLORIDE 0.9 % IV SOLN
Freq: Once | INTRAVENOUS | Status: AC
  Administered 2013-01-22: 14:00:00 via INTRAVENOUS

## 2013-01-22 MED ORDER — ONDANSETRON 8 MG/50ML IVPB (CHCC)
8.0000 mg | Freq: Once | INTRAVENOUS | Status: AC
  Administered 2013-01-22: 8 mg via INTRAVENOUS

## 2013-01-22 MED ORDER — SODIUM CHLORIDE 0.9 % IJ SOLN
10.0000 mL | INTRAMUSCULAR | Status: DC | PRN
  Administered 2013-01-22: 10 mL
  Filled 2013-01-22: qty 10

## 2013-01-22 MED ORDER — DEXAMETHASONE SODIUM PHOSPHATE 10 MG/ML IJ SOLN
INTRAMUSCULAR | Status: AC
  Filled 2013-01-22: qty 1

## 2013-01-22 MED ORDER — ONDANSETRON 8 MG/NS 50 ML IVPB
INTRAVENOUS | Status: AC
  Filled 2013-01-22: qty 8

## 2013-01-22 MED ORDER — SODIUM CHLORIDE 0.9 % IV SOLN
15.0000 mg/kg | Freq: Once | INTRAVENOUS | Status: AC
  Administered 2013-01-22: 1025 mg via INTRAVENOUS
  Filled 2013-01-22: qty 41

## 2013-01-22 MED ORDER — TOPOTECAN HCL CHEMO INJECTION 4 MG
3.9500 mg/m2 | Freq: Once | INTRAVENOUS | Status: AC
  Administered 2013-01-22: 7 mg via INTRAVENOUS
  Filled 2013-01-22: qty 7

## 2013-01-22 MED ORDER — DEXAMETHASONE SODIUM PHOSPHATE 10 MG/ML IJ SOLN
10.0000 mg | Freq: Once | INTRAMUSCULAR | Status: AC
  Administered 2013-01-22: 10 mg via INTRAVENOUS

## 2013-01-22 NOTE — Progress Notes (Signed)
[facility]  Telephone:([phone]) [phone] [phone]  OFFICE PROGRESS NOTE   PCP: Elesa Massed, M.D. SU: Emelia Loron, M.D. RAD ONC: Antony Blackbird, M.D.   DIAGNOSIS: [name] [name] is a 67 year-old female with a history of ductal carcinoma in situ of the left breast diagnosed 04/2009 and ovarian cancer diagnosed in 12/2011.   PRIOR THERAPY: #1. 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 Patient had 3 sentinel biopsied all of them were negative for metastatic disease. Patient underwent radiation therapy between 06/05/2009 through 07/02/2009. She was then begun on Tamoxifen 20 mg daily in 09/2009.  #2  In 12/2011 she received the diagnosis of gynecologic malignancy presenting with abdominal mass and pleural effusions. Patient was originally hospitalized with shortness of breath in 12/2011.  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 She also had malignant ascites.  She underwent bilateral thoracentesis and paracentesis procedures during her hospitalization. During her hospitalization she was seen by gynecologic oncology.  #3 Patient began neoadjuvant chemotherapy consisting of Taxol and Carboplatinum. Her first cycle was administered during her hospitalization. She has completed a total of 6 cycles of Taxol/Carboplatinum from 01/07/2012 - 06/23/2012. Overall she tolerated it well except for some fatigue and neuropathies.  #4 Patient is status post laparotomy in 05/2012 that revealed significant residual disease.   #5  Patient began adjuvant therapy with Topotecan/Avastin starting on 07/24/2012.  CURRENT THERAPY: Cycle 7 day 8 Topotecan with cycle 10 of Avastin   INTERVAL HISTORY:  [name] [name] 67 y.o. female returns today for followup prior to topotecan and Avastin today. Her breathing is somewhat improved. She  was referred to pulmonary and cardiology. Apparently her cardiology meds have been changed to a little bit and she does notice an improvement in her breathing. She had a really good Thanksgiving. Today she denies any headaches double vision blurring of vision fevers chills or night sweats. No abdominal distention or pain. She did develop some cramping in her legs but with massage these improved. This is the first time that this has happened and only 1 episode. She denies any peripheral paresthesias. She is walking well. She has no bleeding problems. No constipation or diarrhea. Remainder of the 10 point review of systems is negative.   MEDICAL HISTORY: Past Medical History  Diagnosis Date  . Interstitial cystitis     on chronic antibiotics  . Hyperlipidemia   . Frequent UTI     on prophylaxis  . 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 CAD (coronary artery disease)     a. s/p CABG 7/13;   b.  LHC 12/01/11:  pLAD 70%, mLAD 40%, CFX 40-50% prior to takeoff of the OM2, oRCA occluded, mid vessel filled via R->R collaterals and distal vessel filled by L->R collaterals, S-OM1/OM2 (small and diffusely dz) with mid 90% stenosis, 90% at OM1 anastomotic site, continuation of OM2 occluded, S-Dx patent, S-PDA occluded, L-LAD ok, EF 55-65%  => Med Rx rec.  [name] Hx of echocardiogram     Echo 5/13: EF 60-65%, grade 2 diastolic dysfunction  . Hypercholesterolemia   . Pleural effusion 12/28/11    s/p pleurx catheter  . GERD (gastroesophageal reflux disease)   . Arthritis     "just a little; lower back" (12/28/11)  . Gout attack 08/2011    related to "stress post OHS"  . Depression   . Breast cancer     "  left"; on Tamoxifen  . S/P thoracentesis 12/28/11    "for pleural effusion" (12/28/2011)  . Ovarian cancer   . Tachycardia   . Neuromuscular disorder     peripheral neuropathy    ALLERGIES:  is allergic to codeine; isosorbide; other; phenothiazines; talwin; yellow jacket venom; ambien; and tegaderm ag  mesh.  MEDICATIONS:  Current Outpatient Prescriptions  Medication Sig Dispense Refill  . ALPRAZolam (XANAX) 0.25 MG tablet Take 1 tablet (0.25 mg total) by mouth every 8 (eight) hours as needed for anxiety.  30 tablet  4  . Alum & Mag Hydroxide-Simeth (MAGIC MOUTHWASH) SOLN Take 5 mLs by mouth 4 (four) times daily.       [name] aspirin 325 MG tablet Take 325 mg by mouth at bedtime.       [name] atorvastatin (LIPITOR) 20 MG tablet TAKE ONE TABLET BY MOUTH ONCE DAILY  90 tablet  3  . bisoprolol (ZEBETA) 10 MG tablet Take 1 tablet (10 mg total) by mouth daily.  30 tablet  6  . docusate sodium (COLACE) 100 MG capsule Take 200 mg by mouth daily.      . famotidine (PEPCID) 20 MG tablet Take 20 mg by mouth. Take 1 tablet at bedtime by mouth      . fluticasone (FLONASE) 50 MCG/ACT nasal spray Place 2 sprays into the nose daily as needed for allergies.      [name] gabapentin (NEURONTIN) 100 MG capsule Take 100 mg orally three times a day  90 capsule  6  . lidocaine-prilocaine (EMLA) cream Apply topically as needed.  30 g  1  . omeprazole (PRILOSEC) 20 MG capsule Take 40 mg by mouth daily.      [name] oxybutynin (DITROPAN-XL) 10 MG 24 hr tablet Take 10 mg by mouth daily.      Bertram Gala Glycol-Propyl Glycol 0.4-0.3 % SOLN Place 1 drop into both eyes every morning. As needed      . prochlorperazine (COMPAZINE) 25 MG suppository Place 1 suppository (25 mg total) rectally every 12 (twelve) hours as needed for nausea.  12 suppository  3  . senna (SENOKOT) 8.6 MG tablet Take 1 tablet by mouth daily as needed for constipation.      . SF 5000 PLUS 1.1 % CREA dental cream Place 1.1 % onto teeth at bedtime.       [name] trimethoprim (TRIMPEX) 100 MG tablet Take 100 mg by mouth every morning.       . venlafaxine XR (EFFEXOR-XR) 75 MG 24 hr capsule Take 75 mg  Orally daily  30 capsule  8  . dexamethasone (DECADRON) 4 MG tablet Take 2 tablets (8 mg total) by mouth 2 (two) times daily with a meal. Take daily starting the day after  chemotherapy for 2 days. Take with food.  30 tablet  1  . EPINEPHrine (EPIPEN 2-PAK) 0.3 mg/0.3 mL DEVI Inject 0.3 mg into the muscle daily as needed (allergic reaction).       [name] HYDROcodone-acetaminophen (NORCO/VICODIN) 5-325 MG per tablet Take 1-2 tablets by mouth every 4 (four) hours as needed.  40 tablet  2  . loratadine (CLARITIN) 10 MG tablet Take 10 mg by mouth. Take 1 tablet daily as needed by mouth      . nitroGLYCERIN (NITROSTAT) 0.4 MG SL tablet Place 0.4 mg under the tongue every 5 (five) minutes as needed. Chest pain      . ondansetron (ZOFRAN) 8 MG tablet Take 1 tablet (8 mg total) by mouth 2 (two) times  daily. Take two times a day starting the day after chemo for 2 days. Then take two times a day as needed for nausea or vomiting.  30 tablet  1  . valACYclovir (VALTREX) 500 MG tablet Take 1 tablet (500 mg total) by mouth daily.  30 tablet  7   No current facility-administered medications for this visit.   Facility-Administered Medications Ordered in Other Visits  Medication Dose Route Frequency Provider Last Rate Last Dose  . influenza  inactive virus vaccine (FLUZONE/FLUARIX) injection 0.5 mL  0.5 mL Intramuscular Once Joselyn Arrow, MD        SURGICAL HISTORY:  Past Surgical History  Procedure Laterality Date  . Breast lumpectomy  04/2009    left  . Cesarean section  1973; 1976  . Muscle release  1960    L neck and chest.; "when I was 13; pneumonia settled in my left neck"  . Coronary artery bypass graft  08/18/2011    Procedure: CORONARY ARTERY BYPASS GRAFTING (CABG);  Surgeon: Alleen Borne, MD;  Location: [facility] OR;  Service: Open Heart Surgery;  Laterality: N/A;  Coronary Artery Bypass Graft times five utilizing the left internal mammary artery and the left greater saphenous vein harvested endoscopically.  . Abdominal hysterectomy  1976  . Appendectomy  1976  . Portacath placement  01/06/2012    Procedure: INSERTION PORT-A-CATH;  Surgeon: Alleen Borne, MD;  Location: [facility] OR;   Service: Thoracic;  Laterality: Left;  . Chest tube insertion  01/06/2012    Procedure: INSERTION PLEURAL DRAINAGE CATHETER;  Surgeon: Alleen Borne, MD;  Location: [facility] OR;  Service: Thoracic;  Laterality: Bilateral;  . Removal of pleural drainage catheter Right 04/12/2012    Procedure: MINOR REMOVAL OF PLEURAL DRAINAGE CATHETER;  Surgeon: Alleen Borne, MD;  Location: [facility] OR;  Service: Thoracic;  Laterality: Right;  . Talc pleurodesis Left 04/12/2012    Procedure: Lurlean Nanny;  Surgeon: Alleen Borne, MD;  Location: [facility] OR;  Service: Thoracic;  Laterality: Left;  . Portacath placement Left 04/17/2012    Procedure: INSERTION PORT-A-CATH;  Surgeon: Alleen Borne, MD;  Location: [facility] OR;  Service: Thoracic;  Laterality: Left;  . Removal of pleural drainage catheter Left 04/17/2012    Procedure: REMOVAL OF PLEURAL DRAINAGE CATHETER;  Surgeon: Alleen Borne, MD;  Location: [facility] OR;  Service: Thoracic;  Laterality: Left;  . Port-a-cath removal Left 04/17/2012    Procedure: REMOVAL PORT-A-CATH;  Surgeon: Alleen Borne, MD;  Location: [facility] OR;  Service: Thoracic;  Laterality: Left;  . Cardiac catheterization    . Laparotomy Bilateral 05/30/2012    Procedure: Resection of umbilical mass, Partial omentectomy;  Surgeon: Jeannette Corpus, MD;  Location: [facility] ORS;  Service: Gynecology;  Laterality: Bilateral;    REVIEW OF SYSTEMS:   A 10 point review of systems was completed and is negative except as noted above.  PHYSICAL EXAMINATION:  BP 137/73  Pulse 85  Temp(Src) 97.9 F (36.6 C) (Oral)  Resp 18  Ht 5\' 7"  (1.702 m)  Wt 160 lb 1 oz (72.604 kg)  BMI 25.06 kg/m2  General: Patient is a well appearing female in no acute distress HEENT: PERRLA, sclerae anicteric, conjunctival pallor, MMM Neck: supple, no palpable adenopathy Lungs: clear to auscultation bilaterally, no wheezes, rhonchi, or rales Cardiovascular: regular rate rhythm, S1, S2, no murmurs, rubs or gallops Abdomen: Soft, non-tender,  non-distended, normoactive bowel sounds, no HSM Extremities: warm and well perfused, no clubbing, cyanosis, or edema Skin: No rashes  or lesions Neuro: Non-focal ECOG PERFORMANCE STATUS: 1 - Symptomatic but completely ambulatory.   LABORATORY DATA: Lab Results  Component Value Date   WBC 6.3 01/22/2013   HGB 10.4* 01/22/2013   HCT 33.6* 01/22/2013   MCV 100.6 01/22/2013   PLT 416* 01/22/2013      Chemistry      Component Value Date/Time   NA 142 01/15/2013 1357   NA 134* 06/02/2012 0515   NA 141 12/17/2011 1144   K 4.0 01/15/2013 1357   K 4.4 06/02/2012 0515   CL 103 08/07/2012 1108   CL 100 06/02/2012 0515   CO2 22 01/15/2013 1357   CO2 25 06/02/2012 0515   BUN 12.9 01/15/2013 1357   BUN 20 06/02/2012 0515   BUN 15 12/17/2011 1144   CREATININE 0.8 01/15/2013 1357   CREATININE 0.76 06/02/2012 0515      Component Value Date/Time   CALCIUM 8.8 01/15/2013 1357   CALCIUM 8.7 06/02/2012 0515   ALKPHOS 86 01/15/2013 1357   ALKPHOS 85 05/26/2012 0955   AST 29 01/15/2013 1357   AST 22 05/26/2012 0955   ALT 36 01/15/2013 1357   ALT 16 05/26/2012 0955   BILITOT 0.36 01/15/2013 1357   BILITOT 0.3 05/26/2012 0955       RADIOGRAPHIC STUDIES: Dg Chest 2 View 08/30/2012   *RADIOLOGY REPORT*  Clinical Data: Shortness of breath, history metastatic ovarian carcinoma and history of bilateral pleural effusions.  CHEST - 2 VIEW  Comparison: 05/26/2012  Findings: There is significant diminishment in bilateral pleural effusions since the prior study with only trace amount of pleural fluid present bilaterally.  The lungs show no evidence of focal consolidation, masses or pulmonary edema.  Port-A-Cath positioning stable in the SVC.  The heart size is normal.  No bony lesions are seen.  IMPRESSION: Significant diminishment in bilateral pleural effusions since the prior chest x-ray. Trace bilateral pleural effusions remain.  No acute findings.   Original Report Authenticated By: Irish Lack, M.D.     ASSESSMENT: 67  year old woman:   #1 History of DCIS of the left breast status post lumpectomy in 04/2009.  Status post radiation therapy completed in 06/2009.  Was on antiestrogen therapy with Tamoxifen 20 mg by mouth daily that was started in 09/2009.  Tamoxifen has been discontinued for now due to her recent diagnosis of ovarian cancer.  #2 Gynecologic malignancy (ovarian carcinoma) diagnosed in 12/2011. Patient received 6 cycles of neoadjuvant chemotherapy consisting of Taxol/Carboplatinum completed in 06/2012.  #3 She is status post laparotomy performed on 05/30/2012. Unfortunately, intraoperatively she was found to have significant residual disease. 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   #4 Patient completed chemotherapy sensitivity testing. Unfortunately the results were inconclusive.  It was decided to proceed with chemotherapy consisting of Topotecan and Avastin.  Dr. Grant Ruts also recommended this treatment regimen.  #5 Patient was started chemotherapy consisting of Topotecan and Avastin  With Topotecan q week on day 1, 8,15 on a 28 day cycle, and Avastin scheduled for every 3 weeks.  #6 Depression/anxiety: Currently on Effexor with good response  #7 Shortness of breath: patient has been seen by cardilogist Dr. Elease Hashimoto and cardiac etiology was ruled out. Patient followed by Dr. Sherene Sires, last appt on 11/23/12 and he recommended a re-conditioning and exercise plan along with dietary modifications.   #8 History of dehydration -patient instructed to drink plenty of water  #9  Cold sores/herpes labialis.  #  10 Neuropathy - currently on Gabapentin 100 mg by mouth 3 times a day.  Neuropathy is currently resolved with this.     PLAN: #1 proceed with scheduled treatment with cycle 7 day 8 of topotecan and cycle 10 of Avastin. She overall is doing well. Remains in stable condition  #2 shortness of breath improved with  changing and cardiac meds.  #3 cramping in the lower extremities resolved.  #4 patient will return in one week's time for lab chemotherapy  All questions were answered.  Patient was encouraged to contact us with any questions, problems or concerns.   I spent 25 minutes counseling the patient face to face.  The total time spent in the appointment was 30 minutes.   Drue Second, MD Medical/Oncology [facility] [phone] (beeper) [phone] (Office)  01/22/2013, 1:12 PM

## 2013-01-22 NOTE — Patient Instructions (Signed)
[facility] Discharge Instructions for Patients Receiving Chemotherapy  Today you received the following chemotherapy agents: Avastin, Topetecan  To help prevent nausea and vomiting after your treatment, we encourage you to take your nausea medication as prescribed.    If you develop nausea and vomiting that is not controlled by your nausea medication, call the clinic.   BELOW ARE SYMPTOMS THAT SHOULD BE REPORTED IMMEDIATELY:  *FEVER GREATER THAN 100.5 F  *CHILLS WITH OR WITHOUT FEVER  NAUSEA AND VOMITING THAT IS NOT CONTROLLED WITH YOUR NAUSEA MEDICATION  *UNUSUAL SHORTNESS OF BREATH  *UNUSUAL BRUISING OR BLEEDING  TENDERNESS IN MOUTH AND THROAT WITH OR WITHOUT PRESENCE OF ULCERS  *URINARY PROBLEMS  *BOWEL PROBLEMS  UNUSUAL RASH Items with * indicate a potential emergency and should be followed up as soon as possible.  Feel free to call the clinic you have any questions or concerns. The clinic phone number is [phone].

## 2013-01-29 ENCOUNTER — Encounter: Payer: Self-pay | Admitting: Adult Health

## 2013-01-29 ENCOUNTER — Ambulatory Visit (HOSPITAL_BASED_OUTPATIENT_CLINIC_OR_DEPARTMENT_OTHER): Payer: Medicare Other | Admitting: Adult Health

## 2013-01-29 ENCOUNTER — Encounter: Payer: Self-pay | Admitting: Oncology

## 2013-01-29 ENCOUNTER — Ambulatory Visit (HOSPITAL_BASED_OUTPATIENT_CLINIC_OR_DEPARTMENT_OTHER): Payer: Medicare Other

## 2013-01-29 ENCOUNTER — Other Ambulatory Visit (HOSPITAL_BASED_OUTPATIENT_CLINIC_OR_DEPARTMENT_OTHER): Payer: Medicare Other

## 2013-01-29 VITALS — BP 133/80 | HR 83 | Temp 98.1°F | Resp 20 | Ht 67.0 in | Wt 163.4 lb

## 2013-01-29 DIAGNOSIS — C569 Malignant neoplasm of unspecified ovary: Secondary | ICD-10-CM

## 2013-01-29 DIAGNOSIS — Z5111 Encounter for antineoplastic chemotherapy: Secondary | ICD-10-CM

## 2013-01-29 DIAGNOSIS — G609 Hereditary and idiopathic neuropathy, unspecified: Secondary | ICD-10-CM

## 2013-01-29 DIAGNOSIS — C786 Secondary malignant neoplasm of retroperitoneum and peritoneum: Secondary | ICD-10-CM

## 2013-01-29 DIAGNOSIS — Z853 Personal history of malignant neoplasm of breast: Secondary | ICD-10-CM

## 2013-01-29 DIAGNOSIS — Z452 Encounter for adjustment and management of vascular access device: Secondary | ICD-10-CM

## 2013-01-29 DIAGNOSIS — F411 Generalized anxiety disorder: Secondary | ICD-10-CM

## 2013-01-29 LAB — CBC WITH DIFFERENTIAL/PLATELET
BASO%: 1.4 % (ref 0.0–2.0)
EOS%: 1 % (ref 0.0–7.0)
HCT: 29.4 % — ABNORMAL LOW (ref 34.8–46.6)
LYMPH%: 15.8 % (ref 14.0–49.7)
MCH: 31 pg (ref 25.1–34.0)
MCHC: 30.6 g/dL — ABNORMAL LOW (ref 31.5–36.0)
MCV: 101.4 fL — ABNORMAL HIGH (ref 79.5–101.0)
MONO%: 6.4 % (ref 0.0–14.0)
NEUT%: 75.4 % (ref 38.4–76.8)
Platelets: 123 10*3/uL — ABNORMAL LOW (ref 145–400)
RBC: 2.9 10*6/uL — ABNORMAL LOW (ref 3.70–5.45)
nRBC: 6 % — ABNORMAL HIGH (ref 0–0)

## 2013-01-29 LAB — COMPREHENSIVE METABOLIC PANEL (CC13)
ALT: 32 U/L (ref 0–55)
AST: 25 U/L (ref 5–34)
Anion Gap: 12 mEq/L — ABNORMAL HIGH (ref 3–11)
BUN: 16.6 mg/dL (ref 7.0–26.0)
CO2: 22 mEq/L (ref 22–29)
Calcium: 8.9 mg/dL (ref 8.4–10.4)
Chloride: 106 mEq/L (ref 98–109)
Creatinine: 0.8 mg/dL (ref 0.6–1.1)
Total Bilirubin: 0.42 mg/dL (ref 0.20–1.20)

## 2013-01-29 MED ORDER — HEPARIN SOD (PORK) LOCK FLUSH 100 UNIT/ML IV SOLN
500.0000 [IU] | Freq: Once | INTRAVENOUS | Status: AC | PRN
  Administered 2013-01-29: 500 [IU]
  Filled 2013-01-29: qty 5

## 2013-01-29 MED ORDER — SODIUM CHLORIDE 0.9 % IJ SOLN
10.0000 mL | INTRAMUSCULAR | Status: DC | PRN
  Administered 2013-01-29: 10 mL
  Filled 2013-01-29: qty 10

## 2013-01-29 MED ORDER — ONDANSETRON 8 MG/50ML IVPB (CHCC)
8.0000 mg | Freq: Once | INTRAVENOUS | Status: AC
  Administered 2013-01-29: 8 mg via INTRAVENOUS

## 2013-01-29 MED ORDER — DEXAMETHASONE SODIUM PHOSPHATE 10 MG/ML IJ SOLN
10.0000 mg | Freq: Once | INTRAMUSCULAR | Status: AC
  Administered 2013-01-29: 10 mg via INTRAVENOUS

## 2013-01-29 MED ORDER — ONDANSETRON 8 MG/NS 50 ML IVPB
INTRAVENOUS | Status: AC
  Filled 2013-01-29: qty 8

## 2013-01-29 MED ORDER — ALTEPLASE 2 MG IJ SOLR
2.0000 mg | Freq: Once | INTRAMUSCULAR | Status: AC | PRN
  Administered 2013-01-29: 2 mg
  Filled 2013-01-29: qty 2

## 2013-01-29 MED ORDER — DEXAMETHASONE SODIUM PHOSPHATE 10 MG/ML IJ SOLN
INTRAMUSCULAR | Status: AC
  Filled 2013-01-29: qty 1

## 2013-01-29 MED ORDER — SODIUM CHLORIDE 0.9 % IV SOLN
Freq: Once | INTRAVENOUS | Status: AC
  Administered 2013-01-29: 16:00:00 via INTRAVENOUS

## 2013-01-29 MED ORDER — TOPOTECAN HCL CHEMO INJECTION 4 MG
4.0000 mg/m2 | Freq: Once | INTRAVENOUS | Status: AC
  Administered 2013-01-29: 7 mg via INTRAVENOUS
  Filled 2013-01-29: qty 7

## 2013-01-29 NOTE — Progress Notes (Signed)
[facility]  [phone] [phone]  OFFICE PROGRESS NOTE   PCP: Elesa Massed, M.D. SU: Emelia Loron, M.D. RAD ONC: Antony Blackbird, M.D.   DIAGNOSIS: [name] [name] is a 67 year-old female with a history of ductal carcinoma in situ of the left breast diagnosed 04/2009 and ovarian cancer diagnosed in 12/2011.   PRIOR THERAPY: #1. 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 Patient had 3 sentinel biopsied all of them were negative for metastatic disease. Patient underwent radiation therapy between 06/05/2009 through 07/02/2009. She was then begun on Tamoxifen 20 mg daily in 09/2009.  #2  In 12/2011 she received the diagnosis of gynecologic malignancy presenting with abdominal mass and pleural effusions. Patient was originally hospitalized with shortness of breath in 12/2011.  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 She also had malignant ascites.  She underwent bilateral thoracentesis and paracentesis procedures during her hospitalization. During her hospitalization she was seen by gynecologic oncology.  #3 Patient began neoadjuvant chemotherapy consisting of Taxol and Carboplatinum. Her first cycle was administered during her hospitalization. She has completed a total of 6 cycles of Taxol/Carboplatinum from 01/07/2012 - 06/23/2012. Overall she tolerated it well except for some fatigue and neuropathies.  #4 Patient is status post laparotomy in 05/2012 that revealed significant residual disease.   #5  Patient began adjuvant therapy with Topotecan/Avastin starting on 07/24/2012.  CURRENT THERAPY: Cycle 7 day 15 Topotecan, Avastin every 21 days  INTERVAL HISTORY:  [name] 68 y.o. female returns today for evaluation prior to Topotecan.  She continues to do well.  Her shortness of breath  remains improved with the changes her cardiologist made to her medications.  She was actually in [facility] this weekend and was able to walk around well without her previous shortness of breath.  She only had one episode of breathlessness, which is much improved from prior.  She denies fevers, chills, nausea, vomiting, constipation, diarrhea, skin changes, numbness, or any further concerns.    MEDICAL HISTORY: Past Medical History  Diagnosis Date  . Interstitial cystitis     on chronic antibiotics  . Hyperlipidemia   . Frequent UTI     on prophylaxis  . 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 CAD (coronary artery disease)     a. s/p CABG 7/13;   b.  LHC 12/01/11:  pLAD 70%, mLAD 40%, CFX 40-50% prior to takeoff of the OM2, oRCA occluded, mid vessel filled via R->R collaterals and distal vessel filled by L->R collaterals, S-OM1/OM2 (small and diffusely dz) with mid 90% stenosis, 90% at OM1 anastomotic site, continuation of OM2 occluded, S-Dx patent, S-PDA occluded, L-LAD ok, EF 55-65%  => Med Rx rec.  [name] Hx of echocardiogram     Echo 5/13: EF 60-65%, grade 2 diastolic dysfunction  . Hypercholesterolemia   . Pleural effusion 12/28/11    s/p pleurx catheter  . GERD (gastroesophageal reflux disease)   . Arthritis     "just a little; lower back" (12/28/11)  . Gout attack 08/2011    related to "stress post OHS"  . Depression   . Breast cancer     "left"; on Tamoxifen  . S/P thoracentesis 12/28/11    "for pleural effusion" (12/28/2011)  . Ovarian cancer   . Tachycardia   . Neuromuscular disorder     peripheral neuropathy    ALLERGIES:  is allergic to codeine; isosorbide; other; phenothiazines; talwin; yellow jacket venom; ambien; and tegaderm ag mesh.  MEDICATIONS:  Current Outpatient Prescriptions  Medication Sig Dispense Refill  . ALPRAZolam (XANAX) 0.25 MG tablet Take 1 tablet (0.25 mg total) by mouth every 8 (eight) hours as needed for anxiety.  30 tablet  4  . Alum & Mag  Hydroxide-Simeth (MAGIC MOUTHWASH) SOLN Take 5 mLs by mouth 4 (four) times daily.       [name] aspirin 325 MG tablet Take 325 mg by mouth at bedtime.       [name] atorvastatin (LIPITOR) 20 MG tablet TAKE ONE TABLET BY MOUTH ONCE DAILY  90 tablet  3  . bisoprolol (ZEBETA) 10 MG tablet Take 1 tablet (10 mg total) by mouth daily.  30 tablet  6  . dexamethasone (DECADRON) 4 MG tablet Take 2 tablets (8 mg total) by mouth 2 (two) times daily with a meal. Take daily starting the day after chemotherapy for 2 days. Take with food.  30 tablet  1  . docusate sodium (COLACE) 100 MG capsule Take 200 mg by mouth daily.      [name] EPINEPHrine (EPIPEN 2-PAK) 0.3 mg/0.3 mL DEVI Inject 0.3 mg into the muscle daily as needed (allergic reaction).       . famotidine (PEPCID) 20 MG tablet Take 20 mg by mouth. Take 1 tablet at bedtime by mouth      . fluticasone (FLONASE) 50 MCG/ACT nasal spray Place 2 sprays into the nose daily as needed for allergies.      [name] gabapentin (NEURONTIN) 100 MG capsule Take 100 mg orally three times a day  90 capsule  6  . lidocaine-prilocaine (EMLA) cream Apply topically as needed.  30 g  1  . loratadine (CLARITIN) 10 MG tablet Take 10 mg by mouth. Take 1 tablet daily as needed by mouth      . nitroGLYCERIN (NITROSTAT) 0.4 MG SL tablet Place 0.4 mg under the tongue every 5 (five) minutes as needed. Chest pain      . omeprazole (PRILOSEC) 20 MG capsule Take 40 mg by mouth daily.      . ondansetron (ZOFRAN) 8 MG tablet Take 1 tablet (8 mg total) by mouth 2 (two) times daily. Take two times a day starting the day after chemo for 2 days. Then take two times a day as needed for nausea or vomiting.  30 tablet  1  . oxybutynin (DITROPAN-XL) 10 MG 24 hr tablet Take 10 mg by mouth daily.      Bertram Gala Glycol-Propyl Glycol 0.4-0.3 % SOLN Place 1 drop into both eyes every morning. As needed      . senna (SENOKOT) 8.6 MG tablet Take 1 tablet by mouth daily as needed for constipation.      . SF 5000 PLUS 1.1 % CREA  dental cream Place 1.1 % onto teeth at bedtime.       [name] trimethoprim (TRIMPEX) 100 MG tablet Take 100 mg by mouth every morning.       . valACYclovir (VALTREX) 500 MG tablet Take 1 tablet (500 mg total) by mouth daily.  30 tablet  7  . venlafaxine XR (EFFEXOR-XR) 75 MG 24 hr capsule Take 75 mg  Orally daily  30 capsule  8  . HYDROcodone-acetaminophen (NORCO/VICODIN) 5-325 MG per tablet Take 1-2 tablets by mouth every 4 (four) hours as needed.  40 tablet  2  . prochlorperazine (COMPAZINE) 25 MG suppository Place 1 suppository (25 mg total) rectally  every 12 (twelve) hours as needed for nausea.  12 suppository  3   No current facility-administered medications for this visit.   Facility-Administered Medications Ordered in Other Visits  Medication Dose Route Frequency Provider Last Rate Last Dose  . influenza  inactive virus vaccine (FLUZONE/FLUARIX) injection 0.5 mL  0.5 mL Intramuscular Once Joselyn Arrow, MD        SURGICAL HISTORY:  Past Surgical History  Procedure Laterality Date  . Breast lumpectomy  04/2009    left  . Cesarean section  1973; 1976  . Muscle release  1960    L neck and chest.; "when I was 13; pneumonia settled in my left neck"  . Coronary artery bypass graft  08/18/2011    Procedure: CORONARY ARTERY BYPASS GRAFTING (CABG);  Surgeon: Alleen Borne, MD;  Location: [facility] OR;  Service: Open Heart Surgery;  Laterality: N/A;  Coronary Artery Bypass Graft times five utilizing the left internal mammary artery and the left greater saphenous vein harvested endoscopically.  . Abdominal hysterectomy  1976  . Appendectomy  1976  . Portacath placement  01/06/2012    Procedure: INSERTION PORT-A-CATH;  Surgeon: Alleen Borne, MD;  Location: [facility] OR;  Service: Thoracic;  Laterality: Left;  . Chest tube insertion  01/06/2012    Procedure: INSERTION PLEURAL DRAINAGE CATHETER;  Surgeon: Alleen Borne, MD;  Location: [facility] OR;  Service: Thoracic;  Laterality: Bilateral;  . Removal of pleural drainage  catheter Right 04/12/2012    Procedure: MINOR REMOVAL OF PLEURAL DRAINAGE CATHETER;  Surgeon: Alleen Borne, MD;  Location: [facility] OR;  Service: Thoracic;  Laterality: Right;  . Talc pleurodesis Left 04/12/2012    Procedure: Lurlean Nanny;  Surgeon: Alleen Borne, MD;  Location: [facility] OR;  Service: Thoracic;  Laterality: Left;  . Portacath placement Left 04/17/2012    Procedure: INSERTION PORT-A-CATH;  Surgeon: Alleen Borne, MD;  Location: [facility] OR;  Service: Thoracic;  Laterality: Left;  . Removal of pleural drainage catheter Left 04/17/2012    Procedure: REMOVAL OF PLEURAL DRAINAGE CATHETER;  Surgeon: Alleen Borne, MD;  Location: [facility] OR;  Service: Thoracic;  Laterality: Left;  . Port-a-cath removal Left 04/17/2012    Procedure: REMOVAL PORT-A-CATH;  Surgeon: Alleen Borne, MD;  Location: [facility] OR;  Service: Thoracic;  Laterality: Left;  . Cardiac catheterization    . Laparotomy Bilateral 05/30/2012    Procedure: Resection of umbilical mass, Partial omentectomy;  Surgeon: Jeannette Corpus, MD;  Location: [facility] ORS;  Service: Gynecology;  Laterality: Bilateral;    REVIEW OF SYSTEMS:   A 10 point review of systems was completed and is negative except as noted above.  PHYSICAL EXAMINATION:  BP 133/80  Pulse 83  Temp(Src) 98.1 F (36.7 C) (Oral)  Resp 20  Ht 5\' 7"  (1.702 m)  Wt 163 lb 6.4 oz (74.118 kg)  BMI 25.59 kg/m2  General: Patient is a well appearing female in no acute distress HEENT: PERRLA, sclerae anicteric, conjunctival pallor, MMM Neck: supple, no palpable adenopathy Lungs: clear to auscultation bilaterally, no wheezes, rhonchi, or rales Cardiovascular: regular rate rhythm, S1, S2, no murmurs, rubs or gallops Abdomen: Soft, non-tender, non-distended, normoactive bowel sounds, no HSM Extremities: warm and well perfused, no clubbing, cyanosis, or edema Skin: No rashes or lesions Neuro: Non-focal ECOG PERFORMANCE STATUS: 1 - Symptomatic but completely ambulatory.   LABORATORY  DATA: Lab Results  Component Value Date   WBC 7.3 01/29/2013   HGB 9.0* 01/29/2013   HCT 29.4* 01/29/2013  MCV 101.4* 01/29/2013   PLT 123* 01/29/2013      Chemistry      Component Value Date/Time   NA 141 01/22/2013 1238   NA 134* 06/02/2012 0515   NA 141 12/17/2011 1144   K 4.3 01/22/2013 1238   K 4.4 06/02/2012 0515   CL 103 08/07/2012 1108   CL 100 06/02/2012 0515   CO2 22 01/22/2013 1238   CO2 25 06/02/2012 0515   BUN 18.4 01/22/2013 1238   BUN 20 06/02/2012 0515   BUN 15 12/17/2011 1144   CREATININE 0.8 01/22/2013 1238   CREATININE 0.76 06/02/2012 0515      Component Value Date/Time   CALCIUM 9.5 01/22/2013 1238   CALCIUM 8.7 06/02/2012 0515   ALKPHOS 81 01/22/2013 1238   ALKPHOS 85 05/26/2012 0955   AST 28 01/22/2013 1238   AST 22 05/26/2012 0955   ALT 40 01/22/2013 1238   ALT 16 05/26/2012 0955   BILITOT 0.44 01/22/2013 1238   BILITOT 0.3 05/26/2012 0955       RADIOGRAPHIC STUDIES: Dg Chest 2 View 08/30/2012   *RADIOLOGY REPORT*  Clinical Data: Shortness of breath, history metastatic ovarian carcinoma and history of bilateral pleural effusions.  CHEST - 2 VIEW  Comparison: 05/26/2012  Findings: There is significant diminishment in bilateral pleural effusions since the prior study with only trace amount of pleural fluid present bilaterally.  The lungs show no evidence of focal consolidation, masses or pulmonary edema.  Port-A-Cath positioning stable in the SVC.  The heart size is normal.  No bony lesions are seen.  IMPRESSION: Significant diminishment in bilateral pleural effusions since the prior chest x-ray. Trace bilateral pleural effusions remain.  No acute findings.   Original Report Authenticated By: Irish Lack, M.D.     ASSESSMENT: 67 year old woman:   #1 History of DCIS of the left breast status post lumpectomy in 04/2009.  Status post radiation therapy completed in 06/2009.  Was on antiestrogen therapy with Tamoxifen 20 mg by mouth daily that was started in 09/2009.   Tamoxifen has been discontinued for now due to her recent diagnosis of ovarian cancer.  #2 Gynecologic malignancy (ovarian carcinoma) diagnosed in 12/2011. Patient received 6 cycles of neoadjuvant chemotherapy consisting of Taxol/Carboplatinum completed in 06/2012.  #3 She is status post laparotomy performed on 05/30/2012. Unfortunately, intraoperatively she was found to have significant residual disease. 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   #4 Patient completed chemotherapy sensitivity testing. Unfortunately the results were inconclusive.  It was decided to proceed with chemotherapy consisting of Topotecan and Avastin.  Dr. Grant Ruts also recommended this treatment regimen.  #5 Patient was started chemotherapy consisting of Topotecan and Avastin  With Topotecan q week on day 1, 8,15 on a 28 day cycle, and Avastin scheduled for every 3 weeks.  #6 Depression/anxiety: Currently on Effexor with good response  #7 Shortness of breath: patient has been seen by cardilogist Dr. Elease Hashimoto and cardiac etiology was ruled out. Patient followed by Dr. Sherene Sires, last appt on 11/23/12 and he recommended a re-conditioning and exercise plan along with dietary modifications.   #8 History of dehydration -patient instructed to drink plenty of water  #9  Cold sores/herpes labialis.  #10 Neuropathy - currently on Gabapentin 100 mg by mouth 3 times a day.  Neuropathy is currently resolved with this.     PLAN: #1 Patient is doing well.  Her CBC is stable.  She will  proceed with Topotecan today.    #2 She will return on 02/12/13 for labs, evaluation and treatment.  #3 Her shortness of breath remains improved with the changes made in her medications by her cardiologist.    All questions were answered.  Patient was encouraged to contact us with any questions, problems or concerns.   I spent 25 minutes counseling the patient face  to face.  The total time spent in the appointment was 30 minutes.  [name] Level, NP Medical Oncology [facility] [phone] 01/29/2013, 2:30 PM

## 2013-02-02 ENCOUNTER — Other Ambulatory Visit: Payer: Self-pay | Admitting: *Deleted

## 2013-02-02 DIAGNOSIS — C50919 Malignant neoplasm of unspecified site of unspecified female breast: Secondary | ICD-10-CM

## 2013-02-02 DIAGNOSIS — C786 Secondary malignant neoplasm of retroperitoneum and peritoneum: Secondary | ICD-10-CM

## 2013-02-02 MED ORDER — MAGIC MOUTHWASH: 5.0000 mL | Freq: Four times a day (QID) | ORAL | Status: AC

## 2013-02-12 ENCOUNTER — Other Ambulatory Visit (HOSPITAL_BASED_OUTPATIENT_CLINIC_OR_DEPARTMENT_OTHER): Payer: Medicare Other

## 2013-02-12 ENCOUNTER — Telehealth: Payer: Self-pay | Admitting: *Deleted

## 2013-02-12 ENCOUNTER — Telehealth: Payer: Self-pay | Admitting: Oncology

## 2013-02-12 ENCOUNTER — Ambulatory Visit (HOSPITAL_BASED_OUTPATIENT_CLINIC_OR_DEPARTMENT_OTHER): Payer: Medicare Other

## 2013-02-12 ENCOUNTER — Encounter: Payer: Self-pay | Admitting: Adult Health

## 2013-02-12 ENCOUNTER — Ambulatory Visit (HOSPITAL_BASED_OUTPATIENT_CLINIC_OR_DEPARTMENT_OTHER): Payer: Medicare Other | Admitting: Adult Health

## 2013-02-12 VITALS — BP 134/77 | HR 72

## 2013-02-12 VITALS — BP 133/75 | HR 85 | Temp 98.3°F | Resp 18 | Ht 67.0 in | Wt 163.0 lb

## 2013-02-12 DIAGNOSIS — R0609 Other forms of dyspnea: Secondary | ICD-10-CM

## 2013-02-12 DIAGNOSIS — D0590 Unspecified type of carcinoma in situ of unspecified breast: Secondary | ICD-10-CM

## 2013-02-12 DIAGNOSIS — C786 Secondary malignant neoplasm of retroperitoneum and peritoneum: Secondary | ICD-10-CM

## 2013-02-12 DIAGNOSIS — C569 Malignant neoplasm of unspecified ovary: Secondary | ICD-10-CM

## 2013-02-12 DIAGNOSIS — F341 Dysthymic disorder: Secondary | ICD-10-CM

## 2013-02-12 DIAGNOSIS — B009 Herpesviral infection, unspecified: Secondary | ICD-10-CM

## 2013-02-12 DIAGNOSIS — Z5112 Encounter for antineoplastic immunotherapy: Secondary | ICD-10-CM

## 2013-02-12 DIAGNOSIS — Z5111 Encounter for antineoplastic chemotherapy: Secondary | ICD-10-CM

## 2013-02-12 LAB — COMPREHENSIVE METABOLIC PANEL (CC13)
ALT: 35 U/L (ref 0–55)
AST: 31 U/L (ref 5–34)
Albumin: 3.3 g/dL — ABNORMAL LOW (ref 3.5–5.0)
Anion Gap: 13 mEq/L — ABNORMAL HIGH (ref 3–11)
CO2: 21 mEq/L — ABNORMAL LOW (ref 22–29)
Calcium: 8.8 mg/dL (ref 8.4–10.4)
Chloride: 109 mEq/L (ref 98–109)
Creatinine: 0.8 mg/dL (ref 0.6–1.1)
Glucose: 84 mg/dl (ref 70–140)
Potassium: 4 mEq/L (ref 3.5–5.1)
Sodium: 143 mEq/L (ref 136–145)
Total Bilirubin: 0.46 mg/dL (ref 0.20–1.20)
Total Protein: 6.6 g/dL (ref 6.4–8.3)

## 2013-02-12 LAB — CBC WITH DIFFERENTIAL/PLATELET
BASO%: 0.6 % (ref 0.0–2.0)
Basophils Absolute: 0 10*3/uL (ref 0.0–0.1)
Eosinophils Absolute: 0.1 10*3/uL (ref 0.0–0.5)
HGB: 9.7 g/dL — ABNORMAL LOW (ref 11.6–15.9)
MCHC: 30.1 g/dL — ABNORMAL LOW (ref 31.5–36.0)
MONO#: 0.8 10*3/uL (ref 0.1–0.9)
NEUT#: 4.1 10*3/uL (ref 1.5–6.5)
Platelets: 364 10*3/uL (ref 145–400)
RBC: 3.08 10*6/uL — ABNORMAL LOW (ref 3.70–5.45)
RDW: 23.9 % — ABNORMAL HIGH (ref 11.2–14.5)
WBC: 6.3 10*3/uL (ref 3.9–10.3)
lymph#: 1.2 10*3/uL (ref 0.9–3.3)
nRBC: 3 % — ABNORMAL HIGH (ref 0–0)

## 2013-02-12 LAB — UA PROTEIN, DIPSTICK - CHCC: Protein, ur: <30 mg/dL

## 2013-02-12 MED ORDER — TOPOTECAN HCL CHEMO INJECTION 4 MG
4.0000 mg/m2 | Freq: Once | INTRAVENOUS | Status: AC
  Administered 2013-02-12: 7 mg via INTRAVENOUS
  Filled 2013-02-12: qty 7

## 2013-02-12 MED ORDER — SODIUM CHLORIDE 0.9 % IV SOLN
Freq: Once | INTRAVENOUS | Status: AC
  Administered 2013-02-12: 16:00:00 via INTRAVENOUS

## 2013-02-12 MED ORDER — DEXAMETHASONE SODIUM PHOSPHATE 10 MG/ML IJ SOLN
INTRAMUSCULAR | Status: AC
  Filled 2013-02-12: qty 1

## 2013-02-12 MED ORDER — SODIUM CHLORIDE 0.9 % IV SOLN
15.0000 mg/kg | Freq: Once | INTRAVENOUS | Status: AC
  Administered 2013-02-12: 1025 mg via INTRAVENOUS
  Filled 2013-02-12: qty 41

## 2013-02-12 MED ORDER — DEXAMETHASONE SODIUM PHOSPHATE 10 MG/ML IJ SOLN
10.0000 mg | Freq: Once | INTRAMUSCULAR | Status: AC
  Administered 2013-02-12: 10 mg via INTRAVENOUS

## 2013-02-12 MED ORDER — ONDANSETRON 8 MG/NS 50 ML IVPB
INTRAVENOUS | Status: AC
  Filled 2013-02-12: qty 8

## 2013-02-12 MED ORDER — SODIUM CHLORIDE 0.9 % IJ SOLN
10.0000 mL | INTRAMUSCULAR | Status: DC | PRN
  Administered 2013-02-12: 10 mL
  Filled 2013-02-12: qty 10

## 2013-02-12 MED ORDER — ONDANSETRON 8 MG/50ML IVPB (CHCC)
8.0000 mg | Freq: Once | INTRAVENOUS | Status: AC
  Administered 2013-02-12: 8 mg via INTRAVENOUS

## 2013-02-12 MED ORDER — HEPARIN SOD (PORK) LOCK FLUSH 100 UNIT/ML IV SOLN
500.0000 [IU] | Freq: Once | INTRAVENOUS | Status: AC | PRN
  Administered 2013-02-12: 500 [IU]
  Filled 2013-02-12: qty 5

## 2013-02-12 NOTE — Telephone Encounter (Signed)
Per staff message and POF I have scheduled appts.  JMW  

## 2013-02-12 NOTE — Patient Instructions (Signed)
[facility] Discharge Instructions for Patients Receiving Chemotherapy  Today you received the following chemotherapy agents: Topetecan and Avastin  To help prevent nausea and vomiting after your treatment, we encourage you to take your nausea medication as needed.   If you develop nausea and vomiting that is not controlled by your nausea medication, call the clinic.   BELOW ARE SYMPTOMS THAT SHOULD BE REPORTED IMMEDIATELY:  *FEVER GREATER THAN 100.5 F  *CHILLS WITH OR WITHOUT FEVER  NAUSEA AND VOMITING THAT IS NOT CONTROLLED WITH YOUR NAUSEA MEDICATION  *UNUSUAL SHORTNESS OF BREATH  *UNUSUAL BRUISING OR BLEEDING  TENDERNESS IN MOUTH AND THROAT WITH OR WITHOUT PRESENCE OF ULCERS  *URINARY PROBLEMS  *BOWEL PROBLEMS  UNUSUAL RASH Items with * indicate a potential emergency and should be followed up as soon as possible.  Feel free to call the clinic you have any questions or concerns. The clinic phone number is [phone].

## 2013-02-12 NOTE — Progress Notes (Signed)
[facility]  [phone] [phone]  OFFICE PROGRESS NOTE   PCP: Elesa Massed, M.D. SU: Emelia Loron, M.D. RAD ONC: Antony Blackbird, M.D.   DIAGNOSIS: [name] [name] is a 67 year-old female with a history of ductal carcinoma in situ of the left breast diagnosed 04/2009 and ovarian cancer diagnosed in 12/2011.   PRIOR THERAPY: #1. 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 Patient had 3 sentinel biopsied all of them were negative for metastatic disease. Patient underwent radiation therapy between 06/05/2009 through 07/02/2009. She was then begun on Tamoxifen 20 mg daily in 09/2009.  #2  In 12/2011 she received the diagnosis of gynecologic malignancy presenting with abdominal mass and pleural effusions. Patient was originally hospitalized with shortness of breath in 12/2011.  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 She also had malignant ascites.  She underwent bilateral thoracentesis and paracentesis procedures during her hospitalization. During her hospitalization she was seen by gynecologic oncology.  #3 Patient began neoadjuvant chemotherapy consisting of Taxol and Carboplatinum. Her first cycle was administered during her hospitalization. She has completed a total of 6 cycles of Taxol/Carboplatinum from 01/07/2012 - 06/23/2012. Overall she tolerated it well except for some fatigue and neuropathies.  #4 Patient is status post laparotomy in 05/2012 that revealed significant residual disease.   #5  Patient began adjuvant therapy with Topotecan/Avastin starting on 07/24/2012.  CURRENT THERAPY: Cycle 8 day 1 Topotecan, Avastin every 21 days  INTERVAL HISTORY:  [name] 67 y.o. female returns today for evaluation prior to Topotecan/Avastin therapy.  She is doing well today.  Her shortness of  breath remains stable.  She had a good holiday.  She denies fevers, chills, nausea, vomiting, constipation, diarrhea, numbness, easy bruising/bleeding, or any further concerns.   MEDICAL HISTORY: Past Medical History  Diagnosis Date  . Interstitial cystitis     on chronic antibiotics  . Hyperlipidemia   . Frequent UTI     on prophylaxis  . 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 CAD (coronary artery disease)     a. s/p CABG 7/13;   b.  LHC 12/01/11:  pLAD 70%, mLAD 40%, CFX 40-50% prior to takeoff of the OM2, oRCA occluded, mid vessel filled via R->R collaterals and distal vessel filled by L->R collaterals, S-OM1/OM2 (small and diffusely dz) with mid 90% stenosis, 90% at OM1 anastomotic site, continuation of OM2 occluded, S-Dx patent, S-PDA occluded, L-LAD ok, EF 55-65%  => Med Rx rec.  [name] Hx of echocardiogram     Echo 5/13: EF 60-65%, grade 2 diastolic dysfunction  . Hypercholesterolemia   . Pleural effusion 12/28/11    s/p pleurx catheter  . GERD (gastroesophageal reflux disease)   . Arthritis     "just a little; lower back" (12/28/11)  . Gout attack 08/2011    related to "stress post OHS"  . Depression   . Breast cancer     "left"; on Tamoxifen  . S/P thoracentesis 12/28/11    "for pleural effusion" (12/28/2011)  . Ovarian cancer   . Tachycardia   . Neuromuscular disorder     peripheral neuropathy    ALLERGIES:  is allergic to codeine; isosorbide; other; phenothiazines; talwin; yellow jacket venom; ambien; and tegaderm ag mesh.  MEDICATIONS:  Current Outpatient Prescriptions  Medication Sig Dispense Refill  . ALPRAZolam (XANAX) 0.25 MG tablet Take 1 tablet (0.25  mg total) by mouth every 8 (eight) hours as needed for anxiety.  30 tablet  4  . Alum & Mag Hydroxide-Simeth (MAGIC MOUTHWASH) SOLN Take 5 mLs by mouth 4 (four) times daily. Take 5 ml by mouth four times daily as needed.  240 mL  2  . aspirin 325 MG tablet Take 325 mg by mouth at bedtime.       [name] atorvastatin (LIPITOR) 20 MG  tablet TAKE ONE TABLET BY MOUTH ONCE DAILY  90 tablet  3  . bisoprolol (ZEBETA) 10 MG tablet Take 1 tablet (10 mg total) by mouth daily.  30 tablet  6  . docusate sodium (COLACE) 100 MG capsule Take 200 mg by mouth daily.      [name] EPINEPHrine (EPIPEN 2-PAK) 0.3 mg/0.3 mL DEVI Inject 0.3 mg into the muscle daily as needed (allergic reaction).       . famotidine (PEPCID) 20 MG tablet Take 20 mg by mouth. Take 1 tablet at bedtime by mouth      . fluticasone (FLONASE) 50 MCG/ACT nasal spray Place 2 sprays into the nose daily as needed for allergies.      [name] gabapentin (NEURONTIN) 100 MG capsule Take 100 mg orally three times a day  90 capsule  6  . HYDROcodone-acetaminophen (NORCO/VICODIN) 5-325 MG per tablet Take 1-2 tablets by mouth every 4 (four) hours as needed.  40 tablet  2  . lidocaine-prilocaine (EMLA) cream Apply topically as needed.  30 g  1  . loratadine (CLARITIN) 10 MG tablet Take 10 mg by mouth. Take 1 tablet daily as needed by mouth      . nitroGLYCERIN (NITROSTAT) 0.4 MG SL tablet Place 0.4 mg under the tongue every 5 (five) minutes as needed. Chest pain      . omeprazole (PRILOSEC) 20 MG capsule Take 40 mg by mouth daily.      [name] oxybutynin (DITROPAN-XL) 10 MG 24 hr tablet Take 10 mg by mouth daily.      Bertram Gala Glycol-Propyl Glycol 0.4-0.3 % SOLN Place 1 drop into both eyes every morning. As needed      . senna (SENOKOT) 8.6 MG tablet Take 1 tablet by mouth daily as needed for constipation.      . SF 5000 PLUS 1.1 % CREA dental cream Place 1.1 % onto teeth at bedtime.       [name] trimethoprim (TRIMPEX) 100 MG tablet Take 100 mg by mouth every morning.       . valACYclovir (VALTREX) 500 MG tablet Take 1 tablet (500 mg total) by mouth daily.  30 tablet  7  . venlafaxine XR (EFFEXOR-XR) 75 MG 24 hr capsule Take 75 mg  Orally daily  30 capsule  8  . dexamethasone (DECADRON) 4 MG tablet Take 2 tablets (8 mg total) by mouth 2 (two) times daily with a meal. Take daily starting the day after  chemotherapy for 2 days. Take with food.  30 tablet  1  . ondansetron (ZOFRAN) 8 MG tablet Take 1 tablet (8 mg total) by mouth 2 (two) times daily. Take two times a day starting the day after chemo for 2 days. Then take two times a day as needed for nausea or vomiting.  30 tablet  1  . prochlorperazine (COMPAZINE) 25 MG suppository Place 1 suppository (25 mg total) rectally every 12 (twelve) hours as needed for nausea.  12 suppository  3   No current facility-administered medications for this visit.   Facility-Administered Medications Ordered in  Other Visits  Medication Dose Route Frequency Provider Last Rate Last Dose  . influenza  inactive virus vaccine (FLUZONE/FLUARIX) injection 0.5 mL  0.5 mL Intramuscular Once Joselyn Arrow, MD        SURGICAL HISTORY:  Past Surgical History  Procedure Laterality Date  . Breast lumpectomy  04/2009    left  . Cesarean section  1973; 1976  . Muscle release  1960    L neck and chest.; "when I was 13; pneumonia settled in my left neck"  . Coronary artery bypass graft  08/18/2011    Procedure: CORONARY ARTERY BYPASS GRAFTING (CABG);  Surgeon: Alleen Borne, MD;  Location: [facility] OR;  Service: Open Heart Surgery;  Laterality: N/A;  Coronary Artery Bypass Graft times five utilizing the left internal mammary artery and the left greater saphenous vein harvested endoscopically.  . Abdominal hysterectomy  1976  . Appendectomy  1976  . Portacath placement  01/06/2012    Procedure: INSERTION PORT-A-CATH;  Surgeon: Alleen Borne, MD;  Location: [facility] OR;  Service: Thoracic;  Laterality: Left;  . Chest tube insertion  01/06/2012    Procedure: INSERTION PLEURAL DRAINAGE CATHETER;  Surgeon: Alleen Borne, MD;  Location: [facility] OR;  Service: Thoracic;  Laterality: Bilateral;  . Removal of pleural drainage catheter Right 04/12/2012    Procedure: MINOR REMOVAL OF PLEURAL DRAINAGE CATHETER;  Surgeon: Alleen Borne, MD;  Location: [facility] OR;  Service: Thoracic;  Laterality: Right;  . Talc  pleurodesis Left 04/12/2012    Procedure: Lurlean Nanny;  Surgeon: Alleen Borne, MD;  Location: [facility] OR;  Service: Thoracic;  Laterality: Left;  . Portacath placement Left 04/17/2012    Procedure: INSERTION PORT-A-CATH;  Surgeon: Alleen Borne, MD;  Location: [facility] OR;  Service: Thoracic;  Laterality: Left;  . Removal of pleural drainage catheter Left 04/17/2012    Procedure: REMOVAL OF PLEURAL DRAINAGE CATHETER;  Surgeon: Alleen Borne, MD;  Location: [facility] OR;  Service: Thoracic;  Laterality: Left;  . Port-a-cath removal Left 04/17/2012    Procedure: REMOVAL PORT-A-CATH;  Surgeon: Alleen Borne, MD;  Location: [facility] OR;  Service: Thoracic;  Laterality: Left;  . Cardiac catheterization    . Laparotomy Bilateral 05/30/2012    Procedure: Resection of umbilical mass, Partial omentectomy;  Surgeon: Jeannette Corpus, MD;  Location: [facility] ORS;  Service: Gynecology;  Laterality: Bilateral;    REVIEW OF SYSTEMS:   A 10 point review of systems was completed and is negative except as noted above.  PHYSICAL EXAMINATION:  BP 133/75  Pulse 85  Temp(Src) 98.3 F (36.8 C) (Oral)  Resp 18  Ht 5\' 7"  (1.702 m)  Wt 163 lb (73.936 kg)  BMI 25.52 kg/m2  General: Patient is a well appearing female in no acute distress HEENT: PERRLA, sclerae anicteric, conjunctival pallor, MMM Neck: supple, no palpable adenopathy Lungs: clear to auscultation bilaterally, no wheezes, rhonchi, or rales Cardiovascular: regular rate rhythm, S1, S2, no murmurs, rubs or gallops Abdomen: Soft, non-tender, non-distended, normoactive bowel sounds, no HSM Extremities: warm and well perfused, no clubbing, cyanosis, or edema Skin: No rashes or lesions Neuro: Non-focal ECOG PERFORMANCE STATUS: 1 - Symptomatic but completely ambulatory.   LABORATORY DATA: Lab Results  Component Value Date   WBC 6.3 02/12/2013   HGB 9.7* 02/12/2013   HCT 32.2* 02/12/2013   MCV 104.5* 02/12/2013   PLT 364 02/12/2013      Chemistry      Component  Value Date/Time   NA 143 02/12/2013 1505  NA 134* 06/02/2012 0515   NA 141 12/17/2011 1144   K 4.0 02/12/2013 1505   K 4.4 06/02/2012 0515   CL 103 08/07/2012 1108   CL 100 06/02/2012 0515   CO2 21* 02/12/2013 1505   CO2 25 06/02/2012 0515   BUN 11.1 02/12/2013 1505   BUN 20 06/02/2012 0515   BUN 15 12/17/2011 1144   CREATININE 0.8 02/12/2013 1505   CREATININE 0.76 06/02/2012 0515      Component Value Date/Time   CALCIUM 8.8 02/12/2013 1505   CALCIUM 8.7 06/02/2012 0515   ALKPHOS 88 02/12/2013 1505   ALKPHOS 85 05/26/2012 0955   AST 31 02/12/2013 1505   AST 22 05/26/2012 0955   ALT 35 02/12/2013 1505   ALT 16 05/26/2012 0955   BILITOT 0.46 02/12/2013 1505   BILITOT 0.3 05/26/2012 0955       RADIOGRAPHIC STUDIES: Dg Chest 2 View 08/30/2012   *RADIOLOGY REPORT*  Clinical Data: Shortness of breath, history metastatic ovarian carcinoma and history of bilateral pleural effusions.  CHEST - 2 VIEW  Comparison: 05/26/2012  Findings: There is significant diminishment in bilateral pleural effusions since the prior study with only trace amount of pleural fluid present bilaterally.  The lungs show no evidence of focal consolidation, masses or pulmonary edema.  Port-A-Cath positioning stable in the SVC.  The heart size is normal.  No bony lesions are seen.  IMPRESSION: Significant diminishment in bilateral pleural effusions since the prior chest x-ray. Trace bilateral pleural effusions remain.  No acute findings.   Original Report Authenticated By: Irish Lack, M.D.     ASSESSMENT: 67 year old woman:   #1 History of DCIS of the left breast status post lumpectomy in 04/2009.  Status post radiation therapy completed in 06/2009.  Was on antiestrogen therapy with Tamoxifen 20 mg by mouth daily that was started in 09/2009.  Tamoxifen has been discontinued for now due to her recent diagnosis of ovarian cancer.  #2 Gynecologic malignancy (ovarian carcinoma) diagnosed in 12/2011. Patient received 6 cycles  of neoadjuvant chemotherapy consisting of Taxol/Carboplatinum completed in 06/2012.  #3 She is status post laparotomy performed on 05/30/2012. Unfortunately, intraoperatively she was found to have significant residual disease. 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   #4 Patient completed chemotherapy sensitivity testing. Unfortunately the results were inconclusive.  It was decided to proceed with chemotherapy consisting of Topotecan and Avastin.  Dr. Grant Ruts also recommended this treatment regimen.  #5 Patient was started chemotherapy consisting of Topotecan and Avastin  With Topotecan q week on day 1, 8,15 on a 28 day cycle, and Avastin scheduled for every 3 weeks.  #6 Depression/anxiety: Currently on Effexor with good response  #7 Shortness of breath: patient has been seen by cardilogist Dr. Elease Hashimoto and cardiac etiology was ruled out. Patient followed by Dr. Sherene Sires, last appt on 11/23/12 and he recommended a re-conditioning and exercise plan along with dietary modifications, she then followed up with cardiology in December, 2014, and changes were made to her cardiac medications which helped.   #8 History of dehydration -patient instructed to drink plenty of water  #9  Cold sores/herpes labialis.  #10 Neuropathy - currently on Gabapentin 100 mg by mouth 3 times a day.  Neuropathy is currently resolved with this.     PLAN: #1 [name] [name] is doing well today.  She will proceed with chemotherapy.  Her CBC is stable.    #2  She  will return to [place] in one week for labs, evaluation and Topotecan.    All questions were answered.  Patient was encouraged to contact us with any questions, problems or concerns.   I spent 15 minutes counseling the patient face to face.  The total time spent in the appointment was 30 minutes.  Illa Level, NP Medical Oncology [facility] [phone] 02/13/2013, 3:30 PM

## 2013-02-19 ENCOUNTER — Other Ambulatory Visit (HOSPITAL_BASED_OUTPATIENT_CLINIC_OR_DEPARTMENT_OTHER): Payer: Medicare Other

## 2013-02-19 ENCOUNTER — Encounter: Payer: Self-pay | Admitting: Adult Health

## 2013-02-19 ENCOUNTER — Ambulatory Visit (HOSPITAL_BASED_OUTPATIENT_CLINIC_OR_DEPARTMENT_OTHER): Payer: Medicare Other | Admitting: Adult Health

## 2013-02-19 ENCOUNTER — Ambulatory Visit (HOSPITAL_BASED_OUTPATIENT_CLINIC_OR_DEPARTMENT_OTHER): Payer: BC Managed Care – PPO

## 2013-02-19 VITALS — BP 137/78 | HR 85 | Temp 97.9°F | Resp 18 | Ht 67.0 in | Wt 158.6 lb

## 2013-02-19 DIAGNOSIS — D0590 Unspecified type of carcinoma in situ of unspecified breast: Secondary | ICD-10-CM

## 2013-02-19 DIAGNOSIS — Z5111 Encounter for antineoplastic chemotherapy: Secondary | ICD-10-CM

## 2013-02-19 DIAGNOSIS — R5381 Other malaise: Secondary | ICD-10-CM

## 2013-02-19 DIAGNOSIS — C786 Secondary malignant neoplasm of retroperitoneum and peritoneum: Secondary | ICD-10-CM

## 2013-02-19 DIAGNOSIS — C569 Malignant neoplasm of unspecified ovary: Secondary | ICD-10-CM

## 2013-02-19 DIAGNOSIS — E559 Vitamin D deficiency, unspecified: Secondary | ICD-10-CM

## 2013-02-19 DIAGNOSIS — R5383 Other fatigue: Secondary | ICD-10-CM

## 2013-02-19 DIAGNOSIS — F341 Dysthymic disorder: Secondary | ICD-10-CM

## 2013-02-19 DIAGNOSIS — C801 Malignant (primary) neoplasm, unspecified: Secondary | ICD-10-CM

## 2013-02-19 DIAGNOSIS — R0602 Shortness of breath: Secondary | ICD-10-CM

## 2013-02-19 LAB — COMPREHENSIVE METABOLIC PANEL (CC13)
ALBUMIN: 3.6 g/dL (ref 3.5–5.0)
ALT: 41 U/L (ref 0–55)
AST: 29 U/L (ref 5–34)
Alkaline Phosphatase: 97 U/L (ref 40–150)
Anion Gap: 13 mEq/L — ABNORMAL HIGH (ref 3–11)
BILIRUBIN TOTAL: 0.49 mg/dL (ref 0.20–1.20)
BUN: 20.7 mg/dL (ref 7.0–26.0)
CO2: 22 mEq/L (ref 22–29)
Calcium: 9.9 mg/dL (ref 8.4–10.4)
Chloride: 105 mEq/L (ref 98–109)
Creatinine: 0.8 mg/dL (ref 0.6–1.1)
Glucose: 91 mg/dl (ref 70–140)
POTASSIUM: 4.5 meq/L (ref 3.5–5.1)
Sodium: 140 mEq/L (ref 136–145)
TOTAL PROTEIN: 7.1 g/dL (ref 6.4–8.3)

## 2013-02-19 LAB — CBC WITH DIFFERENTIAL/PLATELET
BASO%: 3.5 % — AB (ref 0.0–2.0)
Basophils Absolute: 0.2 10*3/uL — ABNORMAL HIGH (ref 0.0–0.1)
EOS%: 1.5 % (ref 0.0–7.0)
Eosinophils Absolute: 0.1 10*3/uL (ref 0.0–0.5)
HCT: 34 % — ABNORMAL LOW (ref 34.8–46.6)
HGB: 10.7 g/dL — ABNORMAL LOW (ref 11.6–15.9)
LYMPH#: 0.9 10*3/uL (ref 0.9–3.3)
LYMPH%: 14.4 % (ref 14.0–49.7)
MCH: 32.2 pg (ref 25.1–34.0)
MCHC: 31.5 g/dL (ref 31.5–36.0)
MCV: 102.4 fL — ABNORMAL HIGH (ref 79.5–101.0)
MONO#: 0.6 10*3/uL (ref 0.1–0.9)
MONO%: 10.6 % (ref 0.0–14.0)
NEUT#: 4.2 10*3/uL (ref 1.5–6.5)
NEUT%: 70 % (ref 38.4–76.8)
NRBC: 15 % — AB (ref 0–0)
Platelets: 445 10*3/uL — ABNORMAL HIGH (ref 145–400)
RBC: 3.32 10*6/uL — AB (ref 3.70–5.45)
RDW: 20.9 % — AB (ref 11.2–14.5)
WBC: 6 10*3/uL (ref 3.9–10.3)

## 2013-02-19 LAB — TECHNOLOGIST REVIEW

## 2013-02-19 MED ORDER — SODIUM CHLORIDE 0.9 % IJ SOLN
10.0000 mL | INTRAMUSCULAR | Status: DC | PRN
  Administered 2013-02-19: 10 mL
  Filled 2013-02-19: qty 10

## 2013-02-19 MED ORDER — ONDANSETRON 8 MG/NS 50 ML IVPB
INTRAVENOUS | Status: AC
  Filled 2013-02-19: qty 8

## 2013-02-19 MED ORDER — SODIUM CHLORIDE 0.9 % IV SOLN
Freq: Once | INTRAVENOUS | Status: AC
  Administered 2013-02-19: 12:00:00 via INTRAVENOUS

## 2013-02-19 MED ORDER — DEXAMETHASONE SODIUM PHOSPHATE 10 MG/ML IJ SOLN
10.0000 mg | Freq: Once | INTRAMUSCULAR | Status: AC
  Administered 2013-02-19: 10 mg via INTRAVENOUS

## 2013-02-19 MED ORDER — TOPOTECAN HCL CHEMO INJECTION 4 MG
4.0000 mg/m2 | Freq: Once | INTRAVENOUS | Status: AC
  Administered 2013-02-19: 7 mg via INTRAVENOUS
  Filled 2013-02-19: qty 7

## 2013-02-19 MED ORDER — DEXAMETHASONE SODIUM PHOSPHATE 10 MG/ML IJ SOLN
INTRAMUSCULAR | Status: AC
  Filled 2013-02-19: qty 1

## 2013-02-19 MED ORDER — HEPARIN SOD (PORK) LOCK FLUSH 100 UNIT/ML IV SOLN
500.0000 [IU] | Freq: Once | INTRAVENOUS | Status: AC | PRN
  Administered 2013-02-19: 500 [IU]
  Filled 2013-02-19: qty 5

## 2013-02-19 MED ORDER — ONDANSETRON 8 MG/50ML IVPB (CHCC)
8.0000 mg | Freq: Once | INTRAVENOUS | Status: AC
  Administered 2013-02-19: 8 mg via INTRAVENOUS

## 2013-02-19 NOTE — Progress Notes (Signed)
[facility]  [phone] [phone]  OFFICE PROGRESS NOTE   PCP: Aris Georgia, M.D. SU: Rolm Bookbinder, M.D. RAD ONC: Gery Pray, M.D.   DIAGNOSIS: [name] [name] is a 68 year-old female with a history of ductal carcinoma in situ of the left breast diagnosed 04/2009 and ovarian cancer diagnosed in 12/2011.   PRIOR THERAPY: #1. 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 Patient had 3 sentinel biopsied all of them were negative for metastatic disease. Patient underwent radiation therapy between 06/05/2009 through 07/02/2009. She was then begun on Tamoxifen 20 mg daily in 09/2009.  #2  In 12/2011 she received the diagnosis of gynecologic malignancy presenting with abdominal mass and pleural effusions. Patient was originally hospitalized with shortness of breath in 12/2011.  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 She also had malignant ascites.  She underwent bilateral thoracentesis and paracentesis procedures during her hospitalization. During her hospitalization she was seen by gynecologic oncology.  #3 Patient began neoadjuvant chemotherapy consisting of Taxol and Carboplatinum. Her first cycle was administered during her hospitalization. She has completed a total of 6 cycles of Taxol/Carboplatinum from 01/07/2012 - 06/23/2012. Overall she tolerated it well except for some fatigue and neuropathies.  #4 Patient is status post laparotomy in 05/2012 that revealed significant residual disease.   #5  Patient began adjuvant therapy with Topotecan/Avastin starting on 07/24/2012.  CURRENT THERAPY: Cycle 8 day 8 Topotecan, Avastin every 21 days  INTERVAL HISTORY:  [name] 68 y.o. female returns today for evaluation prior to Topotecan therapy.  She is doing moderately well today.  She continues  to have shortness of breath and is planning on undergoing acupuncture.  Otherwise, she denies fevers, chills, nausea, vomiting, constipation, diarrhea, numbness, easy bruising/bleeding, swelling or any other concerns.   MEDICAL HISTORY: Past Medical History  Diagnosis Date  . Interstitial cystitis     on chronic antibiotics  . Hyperlipidemia   . Frequent UTI     on prophylaxis  . 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 CAD (coronary artery disease)     a. s/p CABG 7/13;   b.  LHC 12/01/11:  pLAD 70%, mLAD 40%, CFX 40-50% prior to takeoff of the OM2, oRCA occluded, mid vessel filled via R->R collaterals and distal vessel filled by L->R collaterals, S-OM1/OM2 (small and diffusely dz) with mid 90% stenosis, 90% at OM1 anastomotic site, continuation of OM2 occluded, S-Dx patent, S-PDA occluded, L-LAD ok, EF 55-65%  => Med Rx rec.  [name] Hx of echocardiogram     Echo 5/13: EF 46-50%, grade 2 diastolic dysfunction  . Hypercholesterolemia   . Pleural effusion 12/28/11    s/p pleurx catheter  . GERD (gastroesophageal reflux disease)   . Arthritis     "just a little; lower back" (12/28/11)  . Gout attack 08/2011    related to "stress post OHS"  . Depression   . Breast cancer     "left"; on Tamoxifen  . S/P thoracentesis 12/28/11    "for pleural effusion" (12/28/2011)  . Ovarian cancer   . Tachycardia   . Neuromuscular disorder     peripheral neuropathy    ALLERGIES:  is allergic to codeine; isosorbide; other; phenothiazines; talwin; yellow jacket venom; ambien; and tegaderm ag mesh.  MEDICATIONS:  Current Outpatient Prescriptions  Medication Sig Dispense Refill  . ALPRAZolam (XANAX) 0.25 MG tablet  Take 1 tablet (0.25 mg total) by mouth every 8 (eight) hours as needed for anxiety.  30 tablet  4  . Alum & Mag Hydroxide-Simeth (MAGIC MOUTHWASH) SOLN Take 5 mLs by mouth 4 (four) times daily. Take 5 ml by mouth four times daily as needed.  240 mL  2  . aspirin 325 MG tablet Take 325 mg by mouth at  bedtime.       [name] atorvastatin (LIPITOR) 20 MG tablet TAKE ONE TABLET BY MOUTH ONCE DAILY  90 tablet  3  . bisoprolol (ZEBETA) 10 MG tablet Take 1 tablet (10 mg total) by mouth daily.  30 tablet  6  . docusate sodium (COLACE) 100 MG capsule Take 200 mg by mouth daily.      . famotidine (PEPCID) 20 MG tablet Take 20 mg by mouth. Take 1 tablet at bedtime by mouth      . fluticasone (FLONASE) 50 MCG/ACT nasal spray Place 2 sprays into the nose daily as needed for allergies.      [name] gabapentin (NEURONTIN) 100 MG capsule Take 100 mg orally three times a day  90 capsule  6  . lidocaine-prilocaine (EMLA) cream Apply topically as needed.  30 g  1  . loratadine (CLARITIN) 10 MG tablet Take 10 mg by mouth. Take 1 tablet daily as needed by mouth      . omeprazole (PRILOSEC) 20 MG capsule Take 40 mg by mouth daily.      [name] dexamethasone (DECADRON) 4 MG tablet Take 2 tablets (8 mg total) by mouth 2 (two) times daily with a meal. Take daily starting the day after chemotherapy for 2 days. Take with food.  30 tablet  1  . EPINEPHrine (EPIPEN 2-PAK) 0.3 mg/0.3 mL DEVI Inject 0.3 mg into the muscle daily as needed (allergic reaction).       [name] HYDROcodone-acetaminophen (NORCO/VICODIN) 5-325 MG per tablet Take 1-2 tablets by mouth every 4 (four) hours as needed.  40 tablet  2  . nitroGLYCERIN (NITROSTAT) 0.4 MG SL tablet Place 0.4 mg under the tongue every 5 (five) minutes as needed. Chest pain      . ondansetron (ZOFRAN) 8 MG tablet Take 1 tablet (8 mg total) by mouth 2 (two) times daily. Take two times a day starting the day after chemo for 2 days. Then take two times a day as needed for nausea or vomiting.  30 tablet  1  . oxybutynin (DITROPAN-XL) 10 MG 24 hr tablet Take 10 mg by mouth daily.      Vladimir Faster Glycol-Propyl Glycol 0.4-0.3 % SOLN Place 1 drop into both eyes every morning. As needed      . prochlorperazine (COMPAZINE) 25 MG suppository Place 1 suppository (25 mg total) rectally every 12 (twelve) hours as  needed for nausea.  12 suppository  3  . senna (SENOKOT) 8.6 MG tablet Take 1 tablet by mouth daily as needed for constipation.      . SF 5000 PLUS 1.1 % CREA dental cream Place 1.1 % onto teeth at bedtime.       [name] trimethoprim (TRIMPEX) 100 MG tablet Take 100 mg by mouth every morning.       . valACYclovir (VALTREX) 500 MG tablet Take 1 tablet (500 mg total) by mouth daily.  30 tablet  7  . venlafaxine XR (EFFEXOR-XR) 75 MG 24 hr capsule Take 75 mg  Orally daily  30 capsule  8   No current facility-administered medications for this visit.  Facility-Administered Medications Ordered in Other Visits  Medication Dose Route Frequency Provider Last Rate Last Dose  . influenza  inactive virus vaccine (FLUZONE/FLUARIX) injection 0.5 mL  0.5 mL Intramuscular Once Rita Ohara, MD        SURGICAL HISTORY:  Past Surgical History  Procedure Laterality Date  . Breast lumpectomy  04/2009    left  . Cesarean section  1973; 1976  . Muscle release  1960    L neck and chest.; "when I was 13; pneumonia settled in my left neck"  . Coronary artery bypass graft  08/18/2011    Procedure: CORONARY ARTERY BYPASS GRAFTING (CABG);  Surgeon: Gaye Pollack, MD;  Location: [facility];  Service: Open Heart Surgery;  Laterality: N/A;  Coronary Artery Bypass Graft times five utilizing the left internal mammary artery and the left greater saphenous vein harvested endoscopically.  . Abdominal hysterectomy  1976  . Appendectomy  1976  . Portacath placement  01/06/2012    Procedure: INSERTION PORT-A-CATH;  Surgeon: Gaye Pollack, MD;  Location: [facility];  Service: Thoracic;  Laterality: Left;  . Chest tube insertion  01/06/2012    Procedure: INSERTION PLEURAL DRAINAGE CATHETER;  Surgeon: Gaye Pollack, MD;  Location: [facility];  Service: Thoracic;  Laterality: Bilateral;  . Removal of pleural drainage catheter Right 04/12/2012    Procedure: MINOR REMOVAL OF PLEURAL DRAINAGE CATHETER;  Surgeon: Gaye Pollack, MD;  Location: [facility];   Service: Thoracic;  Laterality: Right;  . Talc pleurodesis Left 04/12/2012    Procedure: Pietro Cassis;  Surgeon: Gaye Pollack, MD;  Location: [facility];  Service: Thoracic;  Laterality: Left;  . Portacath placement Left 04/17/2012    Procedure: INSERTION PORT-A-CATH;  Surgeon: Gaye Pollack, MD;  Location: [facility] OR;  Service: Thoracic;  Laterality: Left;  . Removal of pleural drainage catheter Left 04/17/2012    Procedure: REMOVAL OF PLEURAL DRAINAGE CATHETER;  Surgeon: Gaye Pollack, MD;  Location: [facility];  Service: Thoracic;  Laterality: Left;  . Port-a-cath removal Left 04/17/2012    Procedure: REMOVAL PORT-A-CATH;  Surgeon: Gaye Pollack, MD;  Location: [facility] OR;  Service: Thoracic;  Laterality: Left;  . Cardiac catheterization    . Laparotomy Bilateral 05/30/2012    Procedure: Resection of umbilical mass, Partial omentectomy;  Surgeon: Alvino Chapel, MD;  Location: [facility];  Service: Gynecology;  Laterality: Bilateral;    REVIEW OF SYSTEMS:   A 10 point review of systems was completed and is negative except as noted above.  PHYSICAL EXAMINATION:  BP 137/78  Pulse 85  Temp(Src) 97.9 F (36.6 C) (Oral)  Resp 18  Ht 5\' 7"  (1.702 m)  Wt 158 lb 9.6 oz (71.94 kg)  BMI 24.83 kg/m2  GENERAL: Patient is a well appearing female in no acute distress HEENT:  Sclerae anicteric.  Oropharynx clear and moist. No ulcerations or evidence of oropharyngeal candidiasis. Neck is supple.  NODES:  No cervical, supraclavicular, or axillary lymphadenopathy palpated.  BREAST EXAM:  Deferred. LUNGS:  Clear to auscultation bilaterally.  No wheezes or rhonchi. HEART:  Regular rate and rhythm. No murmur appreciated. ABDOMEN:  Soft, nontender.  Positive, normoactive bowel sounds. No organomegaly palpated. MSK:  No focal spinal tenderness to palpation. Full range of motion bilaterally in the upper extremities. EXTREMITIES:  No peripheral edema.   SKIN:  Clear with no obvious rashes or skin changes. No nail  dyscrasia. NEURO:  Nonfocal. Well oriented.  Appropriate affect. ECOG PERFORMANCE STATUS: 1 - Symptomatic but  completely ambulatory.   LABORATORY DATA: Lab Results  Component Value Date   WBC 6.0 02/19/2013   HGB 10.7* 02/19/2013   HCT 34.0* 02/19/2013   MCV 102.4* 02/19/2013   PLT 445* 02/19/2013      Chemistry      Component Value Date/Time   NA 140 02/19/2013 1032   NA 134* 06/02/2012 0515   NA 141 12/17/2011 1144   K 4.5 02/19/2013 1032   K 4.4 06/02/2012 0515   CL 103 08/07/2012 1108   CL 100 06/02/2012 0515   CO2 22 02/19/2013 1032   CO2 25 06/02/2012 0515   BUN 20.7 02/19/2013 1032   BUN 20 06/02/2012 0515   BUN 15 12/17/2011 1144   CREATININE 0.8 02/19/2013 1032   CREATININE 0.76 06/02/2012 0515      Component Value Date/Time   CALCIUM 9.9 02/19/2013 1032   CALCIUM 8.7 06/02/2012 0515   ALKPHOS 97 02/19/2013 1032   ALKPHOS 85 05/26/2012 0955   AST 29 02/19/2013 1032   AST 22 05/26/2012 0955   ALT 41 02/19/2013 1032   ALT 16 05/26/2012 0955   BILITOT 0.49 02/19/2013 1032   BILITOT 0.3 05/26/2012 0955       RADIOGRAPHIC STUDIES: Dg Chest 2 View 08/30/2012   *RADIOLOGY REPORT*  Clinical Data: Shortness of breath, history metastatic ovarian carcinoma and history of bilateral pleural effusions.  CHEST - 2 VIEW  Comparison: 05/26/2012  Findings: There is significant diminishment in bilateral pleural effusions since the prior study with only trace amount of pleural fluid present bilaterally.  The lungs show no evidence of focal consolidation, masses or pulmonary edema.  Port-A-Cath positioning stable in the SVC.  The heart size is normal.  No bony lesions are seen.  IMPRESSION: Significant diminishment in bilateral pleural effusions since the prior chest x-ray. Trace bilateral pleural effusions remain.  No acute findings.   Original Report Authenticated By: Aletta Edouard, M.D.     ASSESSMENT: 68 year old woman:   #1 History of DCIS of the left breast status post lumpectomy in 04/2009.  Status post  radiation therapy completed in 06/2009.  Was on antiestrogen therapy with Tamoxifen 20 mg by mouth daily that was started in 09/2009.  Tamoxifen has been discontinued for now due to her recent diagnosis of ovarian cancer.  #2 Gynecologic malignancy (ovarian carcinoma) diagnosed in 12/2011. Patient received 6 cycles of neoadjuvant chemotherapy consisting of Taxol/Carboplatinum completed in 06/2012.  #3 She is status post laparotomy performed on 05/30/2012. Unfortunately, intraoperatively she was found to have significant residual disease. 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   #4 Patient completed chemotherapy sensitivity testing. Unfortunately the results were inconclusive.  It was decided to proceed with chemotherapy consisting of Topotecan and Avastin.  Dr. Claudean Kinds also recommended this treatment regimen.  #5 Patient was started chemotherapy consisting of Topotecan and Avastin  With Topotecan q week on day 1, 8,15 on a 28 day cycle, and Avastin scheduled for every 3 weeks.  #6 Depression/anxiety: Currently on Effexor with good response  #7 Shortness of breath: patient has been seen by cardilogist Dr. Acie Fredrickson and cardiac etiology was ruled out. Patient followed by Dr. Melvyn Novas, last appt on 11/23/12 and he recommended a re-conditioning and exercise plan along with dietary modifications, she then followed up with cardiology in December, 2014, and changes were made to her cardiac medications which helped.   #8 History of dehydration -patient instructed to drink plenty  of water  #9  Cold sores/herpes labialis.  #10 Neuropathy - currently on Gabapentin 100 mg by mouth 3 times a day.  Neuropathy is currently resolved with this.     PLAN: #1 [name] [name] is doing well today.  She will proceed with chemotherapy.  Her CBC is stable.  I encouraged her that if she was going to undergo acupuncture, to do so the week  prior to avastin.    #2  She will return to [place] in one week for labs, evaluation and Topotecan.    #3 I ordered a CA 125 for her next appt.  All questions were answered.  Patient was encouraged to contact us with any questions, problems or concerns.   I spent 25 minutes counseling the patient face to face.  The total time spent in the appointment was 30 minutes.  Minette Headland, [place] [phone] 02/20/2013, 1:58 PM

## 2013-02-19 NOTE — Patient Instructions (Addendum)
[facility] Discharge Instructions for Patients Receiving Chemotherapy  Today you received the following chemotherapy agents Topotecan.  To help prevent nausea and vomiting after your treatment, we encourage you to take your nausea medication as prescribed.   If you develop nausea and vomiting that is not controlled by your nausea medication, call the clinic.   BELOW ARE SYMPTOMS THAT SHOULD BE REPORTED IMMEDIATELY:  *FEVER GREATER THAN 100.5 F  *CHILLS WITH OR WITHOUT FEVER  NAUSEA AND VOMITING THAT IS NOT CONTROLLED WITH YOUR NAUSEA MEDICATION  *UNUSUAL SHORTNESS OF BREATH  *UNUSUAL BRUISING OR BLEEDING  TENDERNESS IN MOUTH AND THROAT WITH OR WITHOUT PRESENCE OF ULCERS  *URINARY PROBLEMS  *BOWEL PROBLEMS  UNUSUAL RASH Items with * indicate a potential emergency and should be followed up as soon as possible.  Feel free to call the clinic you have any questions or concerns. The clinic phone number is [phone].    

## 2013-02-28 ENCOUNTER — Ambulatory Visit (HOSPITAL_BASED_OUTPATIENT_CLINIC_OR_DEPARTMENT_OTHER): Payer: Medicare Other | Admitting: Hematology and Oncology

## 2013-02-28 ENCOUNTER — Other Ambulatory Visit (HOSPITAL_BASED_OUTPATIENT_CLINIC_OR_DEPARTMENT_OTHER): Payer: Medicare Other

## 2013-02-28 ENCOUNTER — Telehealth: Payer: Self-pay | Admitting: *Deleted

## 2013-02-28 ENCOUNTER — Ambulatory Visit (HOSPITAL_BASED_OUTPATIENT_CLINIC_OR_DEPARTMENT_OTHER): Payer: Medicare Other

## 2013-02-28 VITALS — BP 133/79 | HR 81 | Temp 97.6°F | Resp 18 | Ht 67.0 in | Wt 159.3 lb

## 2013-02-28 DIAGNOSIS — C786 Secondary malignant neoplasm of retroperitoneum and peritoneum: Secondary | ICD-10-CM

## 2013-02-28 DIAGNOSIS — F341 Dysthymic disorder: Secondary | ICD-10-CM

## 2013-02-28 DIAGNOSIS — C569 Malignant neoplasm of unspecified ovary: Secondary | ICD-10-CM

## 2013-02-28 DIAGNOSIS — N390 Urinary tract infection, site not specified: Secondary | ICD-10-CM

## 2013-02-28 DIAGNOSIS — D0590 Unspecified type of carcinoma in situ of unspecified breast: Secondary | ICD-10-CM

## 2013-02-28 DIAGNOSIS — C50919 Malignant neoplasm of unspecified site of unspecified female breast: Secondary | ICD-10-CM

## 2013-02-28 DIAGNOSIS — C801 Malignant (primary) neoplasm, unspecified: Secondary | ICD-10-CM

## 2013-02-28 DIAGNOSIS — Z5111 Encounter for antineoplastic chemotherapy: Secondary | ICD-10-CM

## 2013-02-28 DIAGNOSIS — Z452 Encounter for adjustment and management of vascular access device: Secondary | ICD-10-CM

## 2013-02-28 DIAGNOSIS — R0602 Shortness of breath: Secondary | ICD-10-CM

## 2013-02-28 HISTORY — DX: Malignant neoplasm of unspecified ovary: C56.9

## 2013-02-28 LAB — CBC WITH DIFFERENTIAL/PLATELET
BASO%: 0.6 % (ref 0.0–2.0)
Basophils Absolute: 0 10*3/uL (ref 0.0–0.1)
EOS%: 0.4 % (ref 0.0–7.0)
Eosinophils Absolute: 0 10*3/uL (ref 0.0–0.5)
HCT: 31.4 % — ABNORMAL LOW (ref 34.8–46.6)
HGB: 9.4 g/dL — ABNORMAL LOW (ref 11.6–15.9)
LYMPH%: 22.8 % (ref 14.0–49.7)
MCH: 31.6 pg (ref 25.1–34.0)
MCHC: 29.9 g/dL — AB (ref 31.5–36.0)
MCV: 105.7 fL — ABNORMAL HIGH (ref 79.5–101.0)
MONO#: 0.4 10*3/uL (ref 0.1–0.9)
MONO%: 8.1 % (ref 0.0–14.0)
NEUT#: 3.5 10*3/uL (ref 1.5–6.5)
NEUT%: 68.1 % (ref 38.4–76.8)
Platelets: 113 10*3/uL — ABNORMAL LOW (ref 145–400)
RBC: 2.97 10*6/uL — ABNORMAL LOW (ref 3.70–5.45)
RDW: 21.1 % — ABNORMAL HIGH (ref 11.2–14.5)
WBC: 5.1 10*3/uL (ref 3.9–10.3)
lymph#: 1.2 10*3/uL (ref 0.9–3.3)

## 2013-02-28 LAB — URINALYSIS, MICROSCOPIC - CHCC
BILIRUBIN (URINE): NEGATIVE
GLUCOSE UR CHCC: NEGATIVE mg/dL
Ketones: NEGATIVE mg/dL
NITRITE: POSITIVE
Protein: <30 mg/dL
Specific Gravity, Urine: 1.03 (ref 1.003–1.035)
Urobilinogen, UR: 0.2 mg/dL (ref 0.2–1)
pH: 5 (ref 4.6–8.0)

## 2013-02-28 LAB — COMPREHENSIVE METABOLIC PANEL (CC13)
ALT: 35 U/L (ref 0–55)
ANION GAP: 13 meq/L — AB (ref 3–11)
AST: 26 U/L (ref 5–34)
Albumin: 3.5 g/dL (ref 3.5–5.0)
Alkaline Phosphatase: 91 U/L (ref 40–150)
BUN: 11.1 mg/dL (ref 7.0–26.0)
CO2: 23 meq/L (ref 22–29)
CREATININE: 0.7 mg/dL (ref 0.6–1.1)
Calcium: 9 mg/dL (ref 8.4–10.4)
Chloride: 105 mEq/L (ref 98–109)
GLUCOSE: 112 mg/dL (ref 70–140)
Potassium: 3.9 mEq/L (ref 3.5–5.1)
Sodium: 142 mEq/L (ref 136–145)
Total Bilirubin: 0.38 mg/dL (ref 0.20–1.20)
Total Protein: 6.7 g/dL (ref 6.4–8.3)

## 2013-02-28 MED ORDER — DEXAMETHASONE SODIUM PHOSPHATE 10 MG/ML IJ SOLN
10.0000 mg | Freq: Once | INTRAMUSCULAR | Status: AC
  Administered 2013-02-28: 10 mg via INTRAVENOUS

## 2013-02-28 MED ORDER — HEPARIN SOD (PORK) LOCK FLUSH 100 UNIT/ML IV SOLN
500.0000 [IU] | Freq: Once | INTRAVENOUS | Status: AC | PRN
  Administered 2013-02-28: 500 [IU]
  Filled 2013-02-28: qty 5

## 2013-02-28 MED ORDER — SODIUM CHLORIDE 0.9 % IJ SOLN
10.0000 mL | INTRAMUSCULAR | Status: DC | PRN
  Administered 2013-02-28: 10 mL
  Filled 2013-02-28: qty 10

## 2013-02-28 MED ORDER — ONDANSETRON 8 MG/50ML IVPB (CHCC)
8.0000 mg | Freq: Once | INTRAVENOUS | Status: AC
  Administered 2013-02-28: 8 mg via INTRAVENOUS

## 2013-02-28 MED ORDER — DEXAMETHASONE SODIUM PHOSPHATE 10 MG/ML IJ SOLN
INTRAMUSCULAR | Status: AC
  Filled 2013-02-28: qty 1

## 2013-02-28 MED ORDER — TOPOTECAN HCL CHEMO INJECTION 4 MG
4.0000 mg/m2 | Freq: Once | INTRAVENOUS | Status: AC
  Administered 2013-02-28: 7 mg via INTRAVENOUS
  Filled 2013-02-28: qty 7

## 2013-02-28 MED ORDER — ONDANSETRON 8 MG/NS 50 ML IVPB
INTRAVENOUS | Status: AC
  Filled 2013-02-28: qty 8

## 2013-02-28 MED ORDER — SODIUM CHLORIDE 0.9 % IV SOLN
Freq: Once | INTRAVENOUS | Status: AC
  Administered 2013-02-28: 14:00:00 via INTRAVENOUS

## 2013-02-28 MED ORDER — CIPROFLOXACIN HCL 250 MG PO TABS: 250.0000 mg | ORAL_TABLET | Freq: Two times a day (BID) | ORAL | Status: AC

## 2013-02-28 MED ORDER — ALTEPLASE 2 MG IJ SOLR
2.0000 mg | Freq: Once | INTRAMUSCULAR | Status: AC | PRN
  Administered 2013-02-28: 2 mg
  Filled 2013-02-28: qty 2

## 2013-02-28 NOTE — Progress Notes (Signed)
RN Mickle Mallory

## 2013-02-28 NOTE — Patient Instructions (Signed)
[facility] Discharge Instructions for Patients Receiving Chemotherapy  Today you received the following chemotherapy agents:  topotecan  To help prevent nausea and vomiting after your treatment, we encourage you to take your nausea medication.  Take it as often as prescribed.     If you develop nausea and vomiting that is not controlled by your nausea medication, call the clinic. If it is after clinic hours your family physician or the after hours number for the clinic or go to the Emergency Department.   BELOW ARE SYMPTOMS THAT SHOULD BE REPORTED IMMEDIATELY:  *FEVER GREATER THAN 100.5 F  *CHILLS WITH OR WITHOUT FEVER  NAUSEA AND VOMITING THAT IS NOT CONTROLLED WITH YOUR NAUSEA MEDICATION  *UNUSUAL SHORTNESS OF BREATH  *UNUSUAL BRUISING OR BLEEDING  TENDERNESS IN MOUTH AND THROAT WITH OR WITHOUT PRESENCE OF ULCERS  *URINARY PROBLEMS  *BOWEL PROBLEMS  UNUSUAL RASH Items with * indicate a potential emergency and should be followed up as soon as possible.  Feel free to call the clinic you have any questions or concerns. The clinic phone number is [phone].   I have been informed and understand all the instructions given to me. I know to contact the clinic, my physician, or go to the Emergency Department if any problems should occur. I do not have any questions at this time, but understand that I may call the clinic during office hours   should I have any questions or need assistance in obtaining follow up care.    __________________________________________  _____________  __________ Signature of Patient or Authorized Representative            Date                   Time    __________________________________________ Nurse's Signature    

## 2013-02-28 NOTE — Telephone Encounter (Signed)
Called and spoke with pt to make her aware that Lindsey is not in the office today and that she would be seeing Dr. Kamineni and she is fine with that. 

## 2013-02-28 NOTE — Progress Notes (Signed)
[name] [name]  [phone] [phone]  OFFICE PROGRESS NOTE   PCP: Aris Georgia, M.D. SU: Rolm Bookbinder, M.D. RAD ONC: Gery Pray, M.D.   DIAGNOSIS: [name] [name] is a 68 year-old female with a history of ductal carcinoma in situ of the left breast diagnosed 04/2009 and ovarian cancer diagnosed in 12/2011.  Chief complaint: She is here for cycle 8 day 15 of chemotherapy with topotecan   PRIOR THERAPY: #1. 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 Patient had 3 sentinel biopsied all of them were negative for metastatic disease. Patient underwent radiation therapy between 06/05/2009 through 07/02/2009. She was then begun on Tamoxifen 20 mg daily in 09/2009.  #2  In 12/2011 she received the diagnosis of gynecologic malignancy presenting with abdominal mass and pleural effusions. Patient was originally hospitalized with shortness of breath in 12/2011.  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 She also had malignant ascites.  She underwent bilateral thoracentesis and paracentesis procedures during her hospitalization. During her hospitalization she was seen by gynecologic oncology.  #3 Patient began neoadjuvant chemotherapy consisting of Taxol and Carboplatinum. Her first cycle was administered during her hospitalization. She has completed a total of 6 cycles of Taxol/Carboplatinum from 01/07/2012 - 06/23/2012. Overall she tolerated it well except for some fatigue and neuropathies.  #4 Patient is status post laparotomy in 05/2012 that revealed significant residual disease.   #5  Patient began adjuvant therapy with Topotecan/Avastin starting on 07/24/2012.  CURRENT THERAPY: Cycle 8 day 15 Topotecan and  Avastin every 21 days  INTERVAL HISTORY:  [name] [name] 68 y.o. female returns today for for  initiation of cycle 8 day 15 of chemotherapy with topotecan.  she complains of burning urination is been going on for the past 2-3 days. She says that she has interstitial cystitis for that she is on ditropan and Bactrim since many years.   she denies any fever or chills. She continues to have shortness of breath slightly better than before after changing the medications and was evaluated by pulmonary and cardiology and she says all the workup was negative. she complains of minimal nausea controlled with the medications. Denies any constipation, diarrhea, numbness, easy bruising/bleeding, swelling or any other concerns.   REVIEW OF SYSTEMS:   A 14 point review of systems was completed and is negative except as noted above.    MEDICAL HISTORY: Past Medical History  Diagnosis Date  . Interstitial cystitis     on chronic antibiotics  . Hyperlipidemia   . Frequent UTI     on prophylaxis  . 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 CAD (coronary artery disease)     a. s/p CABG 7/13;   b.  LHC 12/01/11:  pLAD 70%, mLAD 40%, CFX 40-50% prior to takeoff of the OM2, oRCA occluded, mid vessel filled via R->R collaterals and distal vessel filled by L->R collaterals, S-OM1/OM2 (small and diffusely dz) with mid 90% stenosis, 90% at OM1 anastomotic site, continuation of OM2 occluded, S-Dx patent, S-PDA occluded, L-LAD ok, EF 55-65%  => Med Rx rec.  [name] Hx of echocardiogram     Echo 5/13: EF 17-61%, grade 2 diastolic dysfunction  . Hypercholesterolemia   . Pleural effusion 12/28/11    s/p pleurx catheter  . GERD (gastroesophageal reflux disease)   . Arthritis     "just a little; lower back" (  12/28/11)  . Gout attack 08/2011    related to "stress post OHS"  . Depression   . Breast cancer     "left"; on Tamoxifen  . S/P thoracentesis 12/28/11    "for pleural effusion" (12/28/2011)  . Ovarian cancer   . Tachycardia   . Neuromuscular disorder     peripheral neuropathy    ALLERGIES:  is allergic to codeine;  isosorbide; other; phenothiazines; talwin; yellow jacket venom; ambien; and tegaderm ag mesh.  MEDICATIONS:  Current Outpatient Prescriptions  Medication Sig Dispense Refill  . ALPRAZolam (XANAX) 0.25 MG tablet Take 1 tablet (0.25 mg total) by mouth every 8 (eight) hours as needed for anxiety.  30 tablet  4  . Alum & Mag Hydroxide-Simeth (MAGIC MOUTHWASH) SOLN Take 5 mLs by mouth 4 (four) times daily. Take 5 ml by mouth four times daily as needed.  240 mL  2  . aspirin 325 MG tablet Take 325 mg by mouth at bedtime.       [name] atorvastatin (LIPITOR) 20 MG tablet TAKE ONE TABLET BY MOUTH ONCE DAILY  90 tablet  3  . bisoprolol (ZEBETA) 10 MG tablet Take 1 tablet (10 mg total) by mouth daily.  30 tablet  6  . dexamethasone (DECADRON) 4 MG tablet Take 2 tablets (8 mg total) by mouth 2 (two) times daily with a meal. Take daily starting the day after chemotherapy for 2 days. Take with food.  30 tablet  1  . docusate sodium (COLACE) 100 MG capsule Take 200 mg by mouth daily.      [name] EPINEPHrine (EPIPEN 2-PAK) 0.3 mg/0.3 mL DEVI Inject 0.3 mg into the muscle daily as needed (allergic reaction).       . famotidine (PEPCID) 20 MG tablet Take 20 mg by mouth. Take 1 tablet at bedtime by mouth      . fluticasone (FLONASE) 50 MCG/ACT nasal spray Place 2 sprays into the nose daily as needed for allergies.      [name] gabapentin (NEURONTIN) 100 MG capsule Take 100 mg orally three times a day  90 capsule  6  . HYDROcodone-acetaminophen (NORCO/VICODIN) 5-325 MG per tablet Take 1-2 tablets by mouth every 4 (four) hours as needed.  40 tablet  2  . lidocaine-prilocaine (EMLA) cream Apply topically as needed.  30 g  1  . loratadine (CLARITIN) 10 MG tablet Take 10 mg by mouth. Take 1 tablet daily as needed by mouth      . omeprazole (PRILOSEC) 20 MG capsule Take 40 mg by mouth daily.      . ondansetron (ZOFRAN) 8 MG tablet Take 1 tablet (8 mg total) by mouth 2 (two) times daily. Take two times a day starting the day after chemo  for 2 days. Then take two times a day as needed for nausea or vomiting.  30 tablet  1  . oxybutynin (DITROPAN-XL) 10 MG 24 hr tablet Take 10 mg by mouth daily.      Vladimir Faster Glycol-Propyl Glycol 0.4-0.3 % SOLN Place 1 drop into both eyes every morning. As needed      . senna (SENOKOT) 8.6 MG tablet Take 1 tablet by mouth daily as needed for constipation.      . SF 5000 PLUS 1.1 % CREA dental cream Place 1.1 % onto teeth at bedtime.       [name] trimethoprim (TRIMPEX) 100 MG tablet Take 100 mg by mouth every morning.       . valACYclovir (VALTREX) 500 MG tablet  Take 1 tablet (500 mg total) by mouth daily.  30 tablet  7  . venlafaxine XR (EFFEXOR-XR) 75 MG 24 hr capsule Take 75 mg  Orally daily  30 capsule  8  . ciprofloxacin (CIPRO) 250 MG tablet Take 1 tablet (250 mg total) by mouth 2 (two) times daily.  14 tablet  0  . nitroGLYCERIN (NITROSTAT) 0.4 MG SL tablet Place 0.4 mg under the tongue every 5 (five) minutes as needed. Chest pain      . prochlorperazine (COMPAZINE) 25 MG suppository Place 1 suppository (25 mg total) rectally every 12 (twelve) hours as needed for nausea.  12 suppository  3   No current facility-administered medications for this visit.   Facility-Administered Medications Ordered in Other Visits  Medication Dose Route Frequency Provider Last Rate Last Dose  . influenza  inactive virus vaccine (FLUZONE/FLUARIX) injection 0.5 mL  0.5 mL Intramuscular Once Rita Ohara, MD      . sodium chloride 0.9 % injection 10 mL  10 mL Intracatheter PRN Deatra Robinson, MD   10 mL at 02/28/13 1604    SURGICAL HISTORY:  Past Surgical History  Procedure Laterality Date  . Breast lumpectomy  04/2009    left  . Cesarean section  1973; 1976  . Muscle release  1960    L neck and chest.; "when I was 13; pneumonia settled in my left neck"  . Coronary artery bypass graft  08/18/2011    Procedure: CORONARY ARTERY BYPASS GRAFTING (CABG);  Surgeon: Gaye Pollack, MD;  Location: [facility];  Service: Open  Heart Surgery;  Laterality: N/A;  Coronary Artery Bypass Graft times five utilizing the left internal mammary artery and the left greater saphenous vein harvested endoscopically.  . Abdominal hysterectomy  1976  . Appendectomy  1976  . Portacath placement  01/06/2012    Procedure: INSERTION PORT-A-CATH;  Surgeon: Gaye Pollack, MD;  Location: [facility];  Service: Thoracic;  Laterality: Left;  . Chest tube insertion  01/06/2012    Procedure: INSERTION PLEURAL DRAINAGE CATHETER;  Surgeon: Gaye Pollack, MD;  Location: [facility];  Service: Thoracic;  Laterality: Bilateral;  . Removal of pleural drainage catheter Right 04/12/2012    Procedure: MINOR REMOVAL OF PLEURAL DRAINAGE CATHETER;  Surgeon: Gaye Pollack, MD;  Location: [facility];  Service: Thoracic;  Laterality: Right;  . Talc pleurodesis Left 04/12/2012    Procedure: Pietro Cassis;  Surgeon: Gaye Pollack, MD;  Location: [facility];  Service: Thoracic;  Laterality: Left;  . Portacath placement Left 04/17/2012    Procedure: INSERTION PORT-A-CATH;  Surgeon: Gaye Pollack, MD;  Location: [facility];  Service: Thoracic;  Laterality: Left;  . Removal of pleural drainage catheter Left 04/17/2012    Procedure: REMOVAL OF PLEURAL DRAINAGE CATHETER;  Surgeon: Gaye Pollack, MD;  Location: [facility];  Service: Thoracic;  Laterality: Left;  . Port-a-cath removal Left 04/17/2012    Procedure: REMOVAL PORT-A-CATH;  Surgeon: Gaye Pollack, MD;  Location: [facility];  Service: Thoracic;  Laterality: Left;  . Cardiac catheterization    . Laparotomy Bilateral 05/30/2012    Procedure: Resection of umbilical mass, Partial omentectomy;  Surgeon: Alvino Chapel, MD;  Location: [facility];  Service: Gynecology;  Laterality: Bilateral;    PHYSICAL EXAMINATION:  BP 133/79  Pulse 81  Temp(Src) 97.6 F (36.4 C) (Oral)  Resp 18  Ht 5\' 7"  (1.702 m)  Wt 159 lb 4.8 oz (72.258 kg)  BMI 24.94 kg/m2  GENERAL: Patient is  a well appearing female in no acute distress HEENT:  Sclerae  anicteric.  Oropharynx clear and moist. No ulcerations or evidence of oropharyngeal candidiasis. Neck is supple.  NODES:  No cervical, supraclavicular, or axillary lymphadenopathy palpated.  BREAST EXAM:  no masses felt  LUNGS:  Clear to auscultation bilaterally.  No wheezes or rhonchi. HEART:  Regular rate and rhythm. No murmur appreciated. ABDOMEN:  Soft, nontender.  Positive, normoactive bowel sounds. No organomegaly palpated. MSK:  No focal spinal tenderness to palpation. Full range of motion bilaterally in the upper extremities. EXTREMITIES:  No peripheral edema.   SKIN:  Clear with no obvious rashes or skin changes. No nail dyscrasia. NEURO:  Nonfocal. Well oriented.  Appropriate affect. ECOG PERFORMANCE STATUS: 1 - Symptomatic but completely ambulatory.   LABORATORY DATA: Lab Results  Component Value Date   WBC 5.1 02/28/2013   HGB 9.4* 02/28/2013   HCT 31.4* 02/28/2013   MCV 105.7* 02/28/2013   PLT 113* 02/28/2013      Chemistry      Component Value Date/Time   NA 140 02/19/2013 1032   NA 134* 06/02/2012 0515   NA 141 12/17/2011 1144   K 4.5 02/19/2013 1032   K 4.4 06/02/2012 0515   CL 103 08/07/2012 1108   CL 100 06/02/2012 0515   CO2 22 02/19/2013 1032   CO2 25 06/02/2012 0515   BUN 20.7 02/19/2013 1032   BUN 20 06/02/2012 0515   BUN 15 12/17/2011 1144   CREATININE 0.8 02/19/2013 1032   CREATININE 0.76 06/02/2012 0515      Component Value Date/Time   CALCIUM 9.9 02/19/2013 1032   CALCIUM 8.7 06/02/2012 0515   ALKPHOS 97 02/19/2013 1032   ALKPHOS 85 05/26/2012 0955   AST 29 02/19/2013 1032   AST 22 05/26/2012 0955   ALT 41 02/19/2013 1032   ALT 16 05/26/2012 0955   BILITOT 0.49 02/19/2013 1032   BILITOT 0.3 05/26/2012 0955       RADIOGRAPHIC STUDIES: Dg Chest 2 View 08/30/2012   *RADIOLOGY REPORT*  Clinical Data: Shortness of breath, history metastatic ovarian carcinoma and history of bilateral pleural effusions.  CHEST - 2 VIEW  Comparison: 05/26/2012  Findings: There is significant  diminishment in bilateral pleural effusions since the prior study with only trace amount of pleural fluid present bilaterally.  The lungs show no evidence of focal consolidation, masses or pulmonary edema.  Port-A-Cath positioning stable in the SVC.  The heart size is normal.  No bony lesions are seen.  IMPRESSION: Significant diminishment in bilateral pleural effusions since the prior chest x-ray. Trace bilateral pleural effusions remain.  No acute findings.   Original Report Authenticated By: Aletta Edouard, M.D.     ASSESSMENT: 68 year old woman:   #1 History of DCIS of the left breast status post lumpectomy in 04/2009.  Status post radiation therapy completed in 06/2009.  Was on antiestrogen therapy with Tamoxifen 20 mg by mouth daily that was started in 09/2009.  Tamoxifen has been discontinued for now due to her recent diagnosis of ovarian cancer.  #2 Gynecologic malignancy (ovarian carcinoma) diagnosed in 12/2011. Patient received 6 cycles of neoadjuvant chemotherapy consisting of Taxol/Carboplatinum completed in 06/2012.  #3 She is status post laparotomy performed on 05/30/2012. Unfortunately, intraoperatively she was found to have significant residual disease. 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   #4 Patient completed chemotherapy sensitivity testing. Unfortunately the results were inconclusive.  It  was decided to proceed with chemotherapy consisting of Topotecan and Avastin.  Dr. Claudean Kinds also recommended this treatment regimen.  #5 Patient was started chemotherapy consisting of Topotecan and Avastin  With Topotecan q week on day 1, 8,15 on a 28 day cycle, and Avastin scheduled for every 3 weeks.  #6 Depression/anxiety: Currently on Effexor with good response  #7 Shortness of breath: patient has been seen by cardilogist Dr. Acie Fredrickson and cardiac etiology was ruled out. Patient followed by Dr. Melvyn Novas,  last appt on 11/23/12 and he recommended a re-conditioning and exercise plan along with dietary modifications, she then followed up with cardiology in December, 2014, and changes were made to her cardiac medications which helped.   #8 UTI in a patient with a history of interstitial cystitis: Will order the urinalysis and urine cultures. Will initiate Cipro 250 mg by mouth twice a day for 7 days  #10 Neuropathy - currently on Gabapentin 100 mg by mouth 3 times a day.  Neuropathy is currently resolved with this.     PLAN: #1 [name] [name] is doing well today.  She will proceed with  cycle 8 day 15 of chemotherapy with topotecan. Her CBCs is acceptable for treatment.   #2 will arrange for the CT of the chest abdomen and pelvis with contrast to assess the disease response to chemotherapy  #3 we'll also obtain the diagnostic mammogram #4 follow up in 1 week for Avastin therapy and to discuss the CAT scan reports and CA 125 level  #5   CBC differential and CMP with next visit  #6 Cipro 250 mg by mouth twice daily for 7 days for UTI #7 urinalysis and urine cultures  All questions were answered.  Patient was encouraged to contact us with any questions, problems or concerns.   I spent 20 minutes counseling the patient face to face.  The total time spent in the appointment was 30 minutes.   Wilmon Arms M.D.  [facility] [phone] 02/28/2013, 4:37 PM

## 2013-03-01 LAB — CA 125: CA 125: 68.5 U/mL — ABNORMAL HIGH (ref 0.0–30.2)

## 2013-03-02 ENCOUNTER — Telehealth: Payer: Self-pay | Admitting: *Deleted

## 2013-03-02 LAB — URINE CULTURE

## 2013-03-02 NOTE — Telephone Encounter (Signed)
sw pt gv appt for 1/26/15w/ labs@ 9:45am and ov@ 10:15am. i also gv appt for mammo @ [facility]. Pt is aware that cs will call w/ appts for CT abd/ pelvis/chest.. Asked pt to come by and pickup contrast....td

## 2013-03-06 LAB — HM MAMMOGRAPHY

## 2013-03-08 ENCOUNTER — Ambulatory Visit (HOSPITAL_COMMUNITY)
Admission: RE | Admit: 2013-03-08 | Discharge: 2013-03-08 | Disposition: A | Discharge status: Home / Self Care | Payer: Medicare Other | Source: Ambulatory Visit | Attending: Hematology and Oncology | Admitting: Hematology and Oncology

## 2013-03-08 ENCOUNTER — Encounter (HOSPITAL_COMMUNITY): Payer: Self-pay

## 2013-03-08 ENCOUNTER — Encounter: Payer: Self-pay | Admitting: Internal Medicine

## 2013-03-08 DIAGNOSIS — R599 Enlarged lymph nodes, unspecified: Secondary | ICD-10-CM | POA: Insufficient documentation

## 2013-03-08 DIAGNOSIS — R229 Localized swelling, mass and lump, unspecified: Secondary | ICD-10-CM | POA: Insufficient documentation

## 2013-03-08 DIAGNOSIS — I700 Atherosclerosis of aorta: Secondary | ICD-10-CM | POA: Insufficient documentation

## 2013-03-08 DIAGNOSIS — C569 Malignant neoplasm of unspecified ovary: Secondary | ICD-10-CM | POA: Insufficient documentation

## 2013-03-08 DIAGNOSIS — Z79899 Other long term (current) drug therapy: Secondary | ICD-10-CM | POA: Insufficient documentation

## 2013-03-08 DIAGNOSIS — N839 Noninflammatory disorder of ovary, fallopian tube and broad ligament, unspecified: Secondary | ICD-10-CM | POA: Insufficient documentation

## 2013-03-08 DIAGNOSIS — Z853 Personal history of malignant neoplasm of breast: Secondary | ICD-10-CM | POA: Insufficient documentation

## 2013-03-08 DIAGNOSIS — R091 Pleurisy: Secondary | ICD-10-CM | POA: Insufficient documentation

## 2013-03-08 MED ORDER — IOHEXOL 300 MG/ML  SOLN
100.0000 mL | Freq: Once | INTRAMUSCULAR | Status: AC | PRN
  Administered 2013-03-08: 100 mL via INTRAVENOUS

## 2013-03-09 ENCOUNTER — Encounter: Payer: Self-pay | Admitting: Internal Medicine

## 2013-03-12 ENCOUNTER — Ambulatory Visit (HOSPITAL_BASED_OUTPATIENT_CLINIC_OR_DEPARTMENT_OTHER): Payer: Medicare Other

## 2013-03-12 ENCOUNTER — Other Ambulatory Visit (HOSPITAL_BASED_OUTPATIENT_CLINIC_OR_DEPARTMENT_OTHER): Payer: Medicare Other

## 2013-03-12 ENCOUNTER — Telehealth: Payer: Self-pay | Admitting: *Deleted

## 2013-03-12 ENCOUNTER — Encounter: Payer: Self-pay | Admitting: Oncology

## 2013-03-12 ENCOUNTER — Ambulatory Visit (HOSPITAL_BASED_OUTPATIENT_CLINIC_OR_DEPARTMENT_OTHER): Payer: Medicare Other | Admitting: Adult Health

## 2013-03-12 ENCOUNTER — Encounter: Payer: Self-pay | Admitting: Adult Health

## 2013-03-12 VITALS — BP 149/76 | HR 82 | Temp 97.6°F | Resp 18 | Ht 67.0 in | Wt 158.7 lb

## 2013-03-12 VITALS — BP 130/70 | HR 72

## 2013-03-12 DIAGNOSIS — D0590 Unspecified type of carcinoma in situ of unspecified breast: Secondary | ICD-10-CM

## 2013-03-12 DIAGNOSIS — H539 Unspecified visual disturbance: Secondary | ICD-10-CM

## 2013-03-12 DIAGNOSIS — Z5112 Encounter for antineoplastic immunotherapy: Secondary | ICD-10-CM

## 2013-03-12 DIAGNOSIS — C786 Secondary malignant neoplasm of retroperitoneum and peritoneum: Secondary | ICD-10-CM

## 2013-03-12 DIAGNOSIS — C801 Malignant (primary) neoplasm, unspecified: Secondary | ICD-10-CM

## 2013-03-12 DIAGNOSIS — R0602 Shortness of breath: Secondary | ICD-10-CM

## 2013-03-12 DIAGNOSIS — H538 Other visual disturbances: Secondary | ICD-10-CM

## 2013-03-12 DIAGNOSIS — Z5111 Encounter for antineoplastic chemotherapy: Secondary | ICD-10-CM

## 2013-03-12 DIAGNOSIS — G589 Mononeuropathy, unspecified: Secondary | ICD-10-CM

## 2013-03-12 DIAGNOSIS — F341 Dysthymic disorder: Secondary | ICD-10-CM

## 2013-03-12 DIAGNOSIS — C569 Malignant neoplasm of unspecified ovary: Secondary | ICD-10-CM

## 2013-03-12 LAB — COMPREHENSIVE METABOLIC PANEL (CC13)
ALT: 32 U/L (ref 0–55)
ANION GAP: 12 meq/L — AB (ref 3–11)
AST: 27 U/L (ref 5–34)
Albumin: 3.7 g/dL (ref 3.5–5.0)
Alkaline Phosphatase: 85 U/L (ref 40–150)
BUN: 15.4 mg/dL (ref 7.0–26.0)
CO2: 22 meq/L (ref 22–29)
CREATININE: 0.8 mg/dL (ref 0.6–1.1)
Calcium: 9.8 mg/dL (ref 8.4–10.4)
Chloride: 108 mEq/L (ref 98–109)
Glucose: 125 mg/dl (ref 70–140)
Potassium: 4.6 mEq/L (ref 3.5–5.1)
Sodium: 143 mEq/L (ref 136–145)
Total Bilirubin: 0.47 mg/dL (ref 0.20–1.20)
Total Protein: 6.8 g/dL (ref 6.4–8.3)

## 2013-03-12 LAB — CBC WITH DIFFERENTIAL/PLATELET
BASO%: 0.3 % (ref 0.0–2.0)
Basophils Absolute: 0 10*3/uL (ref 0.0–0.1)
EOS ABS: 0.1 10*3/uL (ref 0.0–0.5)
EOS%: 1.6 % (ref 0.0–7.0)
HCT: 33 % — ABNORMAL LOW (ref 34.8–46.6)
HGB: 9.7 g/dL — ABNORMAL LOW (ref 11.6–15.9)
LYMPH%: 16 % (ref 14.0–49.7)
MCH: 30.9 pg (ref 25.1–34.0)
MCHC: 29.4 g/dL — AB (ref 31.5–36.0)
MCV: 105.1 fL — ABNORMAL HIGH (ref 79.5–101.0)
MONO#: 0.4 10*3/uL (ref 0.1–0.9)
MONO%: 9.7 % (ref 0.0–14.0)
NEUT#: 2.8 10*3/uL (ref 1.5–6.5)
NEUT%: 72.4 % (ref 38.4–76.8)
Platelets: 280 10*3/uL (ref 145–400)
RBC: 3.14 10*6/uL — ABNORMAL LOW (ref 3.70–5.45)
RDW: 21 % — ABNORMAL HIGH (ref 11.2–14.5)
WBC: 3.8 10*3/uL — AB (ref 3.9–10.3)
lymph#: 0.6 10*3/uL — ABNORMAL LOW (ref 0.9–3.3)
nRBC: 1 % — ABNORMAL HIGH (ref 0–0)

## 2013-03-12 LAB — UA PROTEIN, DIPSTICK - CHCC: Protein, ur: NEGATIVE mg/dL

## 2013-03-12 MED ORDER — ONDANSETRON 8 MG/50ML IVPB (CHCC)
8.0000 mg | Freq: Once | INTRAVENOUS | Status: AC
  Administered 2013-03-12: 8 mg via INTRAVENOUS

## 2013-03-12 MED ORDER — SODIUM CHLORIDE 0.9 % IV SOLN
15.0000 mg/kg | Freq: Once | INTRAVENOUS | Status: AC
  Administered 2013-03-12: 1025 mg via INTRAVENOUS
  Filled 2013-03-12: qty 41

## 2013-03-12 MED ORDER — DEXAMETHASONE SODIUM PHOSPHATE 10 MG/ML IJ SOLN
10.0000 mg | Freq: Once | INTRAMUSCULAR | Status: AC
  Administered 2013-03-12: 10 mg via INTRAVENOUS

## 2013-03-12 MED ORDER — SODIUM CHLORIDE 0.9 % IJ SOLN
10.0000 mL | INTRAMUSCULAR | Status: DC | PRN
  Administered 2013-03-12: 10 mL
  Filled 2013-03-12: qty 10

## 2013-03-12 MED ORDER — SODIUM CHLORIDE 0.9 % IV SOLN
7.0000 mg | Freq: Once | INTRAVENOUS | Status: AC
  Administered 2013-03-12: 7 mg via INTRAVENOUS
  Filled 2013-03-12: qty 7

## 2013-03-12 MED ORDER — ONDANSETRON 8 MG/NS 50 ML IVPB
INTRAVENOUS | Status: AC
  Filled 2013-03-12: qty 8

## 2013-03-12 MED ORDER — DEXAMETHASONE SODIUM PHOSPHATE 10 MG/ML IJ SOLN
INTRAMUSCULAR | Status: AC
  Filled 2013-03-12: qty 1

## 2013-03-12 MED ORDER — SODIUM CHLORIDE 0.9 % IV SOLN
Freq: Once | INTRAVENOUS | Status: AC
  Administered 2013-03-12: 16:00:00 via INTRAVENOUS

## 2013-03-12 MED ORDER — HEPARIN SOD (PORK) LOCK FLUSH 100 UNIT/ML IV SOLN
500.0000 [IU] | Freq: Once | INTRAVENOUS | Status: AC | PRN
  Administered 2013-03-12: 500 [IU]
  Filled 2013-03-12: qty 5

## 2013-03-12 NOTE — Telephone Encounter (Signed)
Per staff message and POF I have scheduled appts. Advised scheduler to move labs  JMW  

## 2013-03-12 NOTE — Telephone Encounter (Signed)
Lm informed the pt that on 03/28/13 her labs has been moved to 12:15pm w/ tx@ 1pm...td

## 2013-03-12 NOTE — Patient Instructions (Signed)
[facility] Discharge Instructions for Patients Receiving Chemotherapy  Today you received the following chemotherapy agents: Avastin and Topetecan  To help prevent nausea and vomiting after your treatment, we encourage you to take your nausea medication as prescribed by your physician.    If you develop nausea and vomiting that is not controlled by your nausea medication, call the clinic.   BELOW ARE SYMPTOMS THAT SHOULD BE REPORTED IMMEDIATELY:  *FEVER GREATER THAN 100.5 F  *CHILLS WITH OR WITHOUT FEVER  NAUSEA AND VOMITING THAT IS NOT CONTROLLED WITH YOUR NAUSEA MEDICATION  *UNUSUAL SHORTNESS OF BREATH  *UNUSUAL BRUISING OR BLEEDING  TENDERNESS IN MOUTH AND THROAT WITH OR WITHOUT PRESENCE OF ULCERS  *URINARY PROBLEMS  *BOWEL PROBLEMS  UNUSUAL RASH Items with * indicate a potential emergency and should be followed up as soon as possible.  Feel free to call the clinic you have any questions or concerns. The clinic phone number is [phone].

## 2013-03-12 NOTE — Progress Notes (Addendum)
[facility]  [phone] [phone]  OFFICE PROGRESS NOTE   PCP: Aris Georgia, M.D. SU: Rolm Bookbinder, M.D. RAD ONC: Gery Pray, M.D.   DIAGNOSIS: [name] [name] is a 68 year-old female with a history of ductal carcinoma in situ of the left breast diagnosed 04/2009 and ovarian cancer diagnosed in 12/2011.  Chief complaint: She is here for cycle 8 day 15 of chemotherapy with topotecan   PRIOR THERAPY: #1. 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 Patient had 3 sentinel biopsied all of them were negative for metastatic disease. Patient underwent radiation therapy between 06/05/2009 through 07/02/2009. She was then begun on Tamoxifen 20 mg daily in 09/2009.  #2  In 12/2011 she received the diagnosis of gynecologic malignancy presenting with abdominal mass and pleural effusions. Patient was originally hospitalized with shortness of breath in 12/2011.  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 She also had malignant ascites.  She underwent bilateral thoracentesis and paracentesis procedures during her hospitalization. During her hospitalization she was seen by gynecologic oncology.  #3 Patient began neoadjuvant chemotherapy consisting of Taxol and Carboplatinum. Her first cycle was administered during her hospitalization. She has completed a total of 6 cycles of Taxol/Carboplatinum from 01/07/2012 - 06/23/2012. Overall she tolerated it well except for some fatigue and neuropathies.  #4 Patient is status post laparotomy in 05/2012 that revealed significant residual disease.   #5  Patient began adjuvant therapy with Topotecan/Avastin starting on 07/24/2012.  CURRENT THERAPY: Cycle 9 day 1 of Topotecan given on day 1, 8, and 15, every 28 days, and Avastin every 21 days  INTERVAL HISTORY:  [name] 68 y.o. female returns today for for initiation of cycle 9 day 1 of Topotecan therapy and Avastin therapy.  She is doing well today.  She recently had a CT of her chest abdomen and pelvis to evaluate her disease.  She is requesting these results today.  She is doing well.  She is c/o blurred vision that is gradually progressing over the past two months.  She has an appt with opthalmology on Thursday.  Her shortness of breath is slightly improved as well.  Otherwise, she denies fevers, chills, nausea, vomiting, constipation, diarrhea, swelling, easy bruising/bleeding, or any other concerns.     MEDICAL HISTORY: Past Medical History  Diagnosis Date  . Interstitial cystitis     on chronic antibiotics  . Hyperlipidemia   . Frequent UTI     on prophylaxis  . 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 CAD (coronary artery disease)     a. s/p CABG 7/13;   b.  LHC 12/01/11:  pLAD 70%, mLAD 40%, CFX 40-50% prior to takeoff of the OM2, oRCA occluded, mid vessel filled via R->R collaterals and distal vessel filled by L->R collaterals, S-OM1/OM2 (small and diffusely dz) with mid 90% stenosis, 90% at OM1 anastomotic site, continuation of OM2 occluded, S-Dx patent, S-PDA occluded, L-LAD ok, EF 55-65%  => Med Rx rec.  [name] Hx of echocardiogram     Echo 5/13: EF [id number], grade 2 diastolic dysfunction  . Hypercholesterolemia   . Pleural effusion 12/28/11    s/p pleurx catheter  . GERD (gastroesophageal reflux disease)   . Arthritis     "just a little; lower back" (12/28/11)  . Gout attack 08/2011    related to "stress post OHS"  .  Depression   . Breast cancer     "left"; on Tamoxifen  . S/P thoracentesis 12/28/11    "for pleural effusion" (12/28/2011)  . Ovarian cancer   . Tachycardia   . Neuromuscular disorder     peripheral neuropathy    ALLERGIES:  is allergic to codeine; isosorbide; other; phenothiazines; talwin; yellow jacket venom; ambien; and tegaderm ag mesh.  MEDICATIONS:  Current Outpatient  Prescriptions  Medication Sig Dispense Refill  . ALPRAZolam (XANAX) 0.25 MG tablet Take 1 tablet (0.25 mg total) by mouth every 8 (eight) hours as needed for anxiety.  30 tablet  4  . Alum & Mag Hydroxide-Simeth (MAGIC MOUTHWASH) SOLN Take 5 mLs by mouth 4 (four) times daily. Take 5 ml by mouth four times daily as needed.  240 mL  2  . aspirin 325 MG tablet Take 325 mg by mouth at bedtime.       [name] atorvastatin (LIPITOR) 20 MG tablet TAKE ONE TABLET BY MOUTH ONCE DAILY  90 tablet  3  . bisoprolol (ZEBETA) 10 MG tablet Take 1 tablet (10 mg total) by mouth daily.  30 tablet  6  . docusate sodium (COLACE) 100 MG capsule Take 200 mg by mouth daily.      . famotidine (PEPCID) 20 MG tablet Take 20 mg by mouth. Take 1 tablet at bedtime by mouth      . fluticasone (FLONASE) 50 MCG/ACT nasal spray Place 2 sprays into the nose daily as needed for allergies.      [name] gabapentin (NEURONTIN) 100 MG capsule Take 100 mg orally three times a day  90 capsule  6  . lidocaine-prilocaine (EMLA) cream Apply topically as needed.  30 g  1  . omeprazole (PRILOSEC) 20 MG capsule Take 40 mg by mouth daily.      [name] oxybutynin (DITROPAN-XL) 10 MG 24 hr tablet Take 10 mg by mouth daily.      Vladimir Faster Glycol-Propyl Glycol 0.4-0.3 % SOLN Place 1 drop into both eyes every morning. As needed      . senna (SENOKOT) 8.6 MG tablet Take 1 tablet by mouth daily as needed for constipation.      [name] trimethoprim (TRIMPEX) 100 MG tablet Take 100 mg by mouth every morning.       . valACYclovir (VALTREX) 500 MG tablet Take 1 tablet (500 mg total) by mouth daily.  30 tablet  7  . venlafaxine XR (EFFEXOR-XR) 75 MG 24 hr capsule Take 75 mg  Orally daily  30 capsule  8  . dexamethasone (DECADRON) 4 MG tablet Take 2 tablets (8 mg total) by mouth 2 (two) times daily with a meal. Take daily starting the day after chemotherapy for 2 days. Take with food.  30 tablet  1  . EPINEPHrine (EPIPEN 2-PAK) 0.3 mg/0.3 mL DEVI Inject 0.3 mg into the muscle  daily as needed (allergic reaction).       [name] loratadine (CLARITIN) 10 MG tablet Take 10 mg by mouth. Take 1 tablet daily as needed by mouth      . nitroGLYCERIN (NITROSTAT) 0.4 MG SL tablet Place 0.4 mg under the tongue every 5 (five) minutes as needed. Chest pain      . ondansetron (ZOFRAN) 8 MG tablet Take 1 tablet (8 mg total) by mouth 2 (two) times daily. Take two times a day starting the day after chemo for 2 days. Then take two times a day as needed for nausea or vomiting.  30 tablet  1  .  prochlorperazine (COMPAZINE) 25 MG suppository Place 1 suppository (25 mg total) rectally every 12 (twelve) hours as needed for nausea.  12 suppository  3  . SF 5000 PLUS 1.1 % CREA dental cream Place 1.1 % onto teeth at bedtime.        No current facility-administered medications for this visit.   Facility-Administered Medications Ordered in Other Visits  Medication Dose Route Frequency Provider Last Rate Last Dose  . influenza  inactive virus vaccine (FLUZONE/FLUARIX) injection 0.5 mL  0.5 mL Intramuscular Once Rita Ohara, MD      . sodium chloride 0.9 % injection 10 mL  10 mL Intracatheter PRN Deatra Robinson, MD   10 mL at 02/28/13 1604    SURGICAL HISTORY:  Past Surgical History  Procedure Laterality Date  . Breast lumpectomy  04/2009    left  . Cesarean section  1973; 1976  . Muscle release  1960    L neck and chest.; "when I was 13; pneumonia settled in my left neck"  . Coronary artery bypass graft  08/18/2011    Procedure: CORONARY ARTERY BYPASS GRAFTING (CABG);  Surgeon: Gaye Pollack, MD;  Location: [place];  Service: Open Heart Surgery;  Laterality: N/A;  Coronary Artery Bypass Graft times five utilizing the left internal mammary artery and the left greater saphenous vein harvested endoscopically.  . Abdominal hysterectomy  1976  . Appendectomy  1976  . Portacath placement  01/06/2012    Procedure: INSERTION PORT-A-CATH;  Surgeon: Gaye Pollack, MD;  Location: [facility];  Service: Thoracic;   Laterality: Left;  . Chest tube insertion  01/06/2012    Procedure: INSERTION PLEURAL DRAINAGE CATHETER;  Surgeon: Gaye Pollack, MD;  Location: [facility];  Service: Thoracic;  Laterality: Bilateral;  . Removal of pleural drainage catheter Right 04/12/2012    Procedure: MINOR REMOVAL OF PLEURAL DRAINAGE CATHETER;  Surgeon: Gaye Pollack, MD;  Location: [place];  Service: Thoracic;  Laterality: Right;  . Talc pleurodesis Left 04/12/2012    Procedure: Pietro Cassis;  Surgeon: Gaye Pollack, MD;  Location: [place];  Service: Thoracic;  Laterality: Left;  . Portacath placement Left 04/17/2012    Procedure: INSERTION PORT-A-CATH;  Surgeon: Gaye Pollack, MD;  Location[facility];  Service: Thoracic;  Laterality: Left;  . Removal of pleural drainage catheter Left 04/17/2012    Procedure: REMOVAL OF PLEURAL DRAINAGE CATHETER;  Surgeon: Gaye Pollack, MD;  Location: [place];  Service: Thoracic;  Laterality: Left;  . Port-a-cath removal Left 04/17/2012    Procedure: REMOVAL PORT-A-CATH;  Surgeon: Gaye Pollack, MD;  Location: [facility] OR;  Service: Thoracic;  Laterality: Left;  . Cardiac catheterization    . Laparotomy Bilateral 05/30/2012    Procedure: Resection of umbilical mass, Partial omentectomy;  Surgeon: Alvino Chapel, MD;  Location: [facility];  Service: Gynecology;  Laterality: Bilateral;   REVIEW OF SYSTEMS:   A 14 point review of systems was completed and is negative except as noted above.  PHYSICAL EXAMINATION:  BP 149/76  Pulse 82  Temp(Src) 97.6 F (36.4 C) (Oral)  Resp 18  Ht 5\' 7"  (1.702 m)  Wt 158 lb 11.2 oz (71.986 kg)  BMI 24.85 kg/m2  GENERAL: Patient is a well appearing female in no acute distress HEENT:  Sclerae anicteric.  Oropharynx clear and moist. No ulcerations or evidence of oropharyngeal candidiasis. Neck is supple.  NODES:  No cervical, supraclavicular, or axillary lymphadenopathy palpated.  BREAST EXAM:  deferred LUNGS:  Clear  to auscultation bilaterally.  No wheezes or  rhonchi. HEART:  Regular rate and rhythm. No murmur appreciated. ABDOMEN:  Soft, nontender.  Positive, normoactive bowel sounds. No organomegaly palpated. MSK:  No focal spinal tenderness to palpation. Full range of motion bilaterally in the upper extremities. EXTREMITIES:  No peripheral edema.   SKIN:  Clear with no obvious rashes or skin changes. No nail dyscrasia. NEURO:  Nonfocal. Well oriented.  Appropriate affect. ECOG PERFORMANCE STATUS: 1 - Symptomatic but completely ambulatory.   LABORATORY DATA: Lab Results  Component Value Date   WBC 3.8* 03/12/2013   HGB 9.7* 03/12/2013   HCT 33.0* 03/12/2013   MCV 105.1* 03/12/2013   PLT 280 03/12/2013      Chemistry      Component Value Date/Time   NA 143 03/12/2013 1011   NA 134* 06/02/2012 0515   NA 141 12/17/2011 1144   K 4.6 03/12/2013 1011   K 4.4 06/02/2012 0515   CL 103 08/07/2012 1108   CL 100 06/02/2012 0515   CO2 22 03/12/2013 1011   CO2 25 06/02/2012 0515   BUN 15.4 03/12/2013 1011   BUN 20 06/02/2012 0515   BUN 15 12/17/2011 1144   CREATININE 0.8 03/12/2013 1011   CREATININE 0.76 06/02/2012 0515      Component Value Date/Time   CALCIUM 9.8 03/12/2013 1011   CALCIUM 8.7 06/02/2012 0515   ALKPHOS 85 03/12/2013 1011   ALKPHOS 85 05/26/2012 0955   AST 27 03/12/2013 1011   AST 22 05/26/2012 0955   ALT 32 03/12/2013 1011   ALT 16 05/26/2012 0955   BILITOT 0.47 03/12/2013 1011   BILITOT 0.3 05/26/2012 0955       RADIOGRAPHIC STUDIES: Dg Chest 2 View 08/30/2012   *RADIOLOGY REPORT*  Clinical Data: Shortness of breath, history metastatic ovarian carcinoma and history of bilateral pleural effusions.  CHEST - 2 VIEW  Comparison: 05/26/2012  Findings: There is significant diminishment in bilateral pleural effusions since the prior study with only trace amount of pleural fluid present bilaterally.  The lungs show no evidence of focal consolidation, masses or pulmonary edema.  Port-A-Cath positioning stable in the SVC.  The heart size is normal.   No bony lesions are seen.  IMPRESSION: Significant diminishment in bilateral pleural effusions since the prior chest x-ray. Trace bilateral pleural effusions remain.  No acute findings.   Original Report Authenticated By: Aletta Edouard, M.D.     ASSESSMENT: 68 year old woman:   #1 History of DCIS of the left breast status post lumpectomy in 04/2009.  Status post radiation therapy completed in 06/2009.  Was on antiestrogen therapy with Tamoxifen 20 mg by mouth daily that was started in 09/2009.  Tamoxifen has been discontinued for now due to her recent diagnosis of ovarian cancer.  #2 Gynecologic malignancy (ovarian carcinoma) diagnosed in 12/2011. Patient received 6 cycles of neoadjuvant chemotherapy consisting of Taxol/Carboplatinum completed in 06/2012.  #3 She is status post laparotomy performed on 05/30/2012. Unfortunately, intraoperatively she was found to have significant residual disease. 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   #4 Patient completed chemotherapy sensitivity testing. Unfortunately the results were inconclusive.  It was decided to proceed with chemotherapy consisting of Topotecan and Avastin.  Dr. Claudean Kinds also recommended this treatment regimen.  #5 Patient was started chemotherapy consisting of Topotecan and Avastin  With Topotecan q week on day 1, 8,15 on a 28 day cycle, and Avastin scheduled for  every 3 weeks.  #6 Depression/anxiety: Currently on Effexor with good response  #7 Shortness of breath: patient has been seen by cardilogist Dr. Acie Fredrickson and cardiac etiology was ruled out. Patient followed by Dr. Melvyn Novas, last appt on 11/23/12 and he recommended a re-conditioning and exercise plan along with dietary modifications, she then followed up with cardiology in December, 2014, and changes were made to her cardiac medications which helped.   #8 Neuropathy - currently on Gabapentin 100  mg by mouth 3 times a day.  Neuropathy is currently resolved with this.     PLAN: #1 Patient is doing well today.  Her CT scan shows stable disease.  Her CA 125 is stable.  She will continue Topotecan and Avastin therapy.  She will proceed with Topotecan cycle 9 day 1 today and avastin today.  I reviewed her labs with her in detail.  They were stable.    #2 For the blurred vision, she will f/u with opthalmology as scheduled.  We also ordered an MRI of the brain, with a port flush prior to access her port.    #3 She will return to see [place] next week for labs, evaluation, and cycle 9 day 8 of Topotecan chemotherapy.    All questions were answered.  Patient was encouraged to contact us with any questions, problems or concerns.   I spent 25 minutes counseling the patient face to face.  The total time spent in the appointment was 30 minutes.  Minette Headland, [place] [phone] 03/12/2013, 11:40 AM  ATTENDING'S ATTESTATION:  I personally reviewed patient's chart, examined patient myself, formulated the treatment plan as followed.    Overall patient is doing well her CT scans are stable her CA 125 is stable. She will continue to receive Avastin as well as topotecan. She does have complaints of blurred vision she is scheduled to be seen by ophthalmology. We will also obtain MRI of the brain. She will return in one week for follow  Marcy Panning, MD Medical/Oncology [facility] [phone] (beeper) [phone] (Office)  03/19/2013, 12:35 AM

## 2013-03-12 NOTE — Telephone Encounter (Signed)
appts made and printed. Pt is aware that cs will call her w/ an appt for MRI. I lm the Vivien Rota in cs to give me a call when the pt is scheduled so that i can schedule the pt for port access. Pt is aware that tx will be added. i emailed MW to add the tx's...Marland KitchenMarland Kitchentd

## 2013-03-14 ENCOUNTER — Telehealth: Payer: Self-pay | Admitting: *Deleted

## 2013-03-14 NOTE — Telephone Encounter (Signed)
sw pt gv appt for port access on 03/23/13@ 4pm. Pt is aware...td

## 2013-03-19 ENCOUNTER — Telehealth: Payer: Self-pay | Admitting: *Deleted

## 2013-03-19 ENCOUNTER — Ambulatory Visit (HOSPITAL_BASED_OUTPATIENT_CLINIC_OR_DEPARTMENT_OTHER): Payer: Medicare Other | Admitting: Adult Health

## 2013-03-19 ENCOUNTER — Other Ambulatory Visit (HOSPITAL_BASED_OUTPATIENT_CLINIC_OR_DEPARTMENT_OTHER): Payer: Medicare Other

## 2013-03-19 ENCOUNTER — Encounter: Payer: Self-pay | Admitting: Adult Health

## 2013-03-19 ENCOUNTER — Ambulatory Visit (HOSPITAL_BASED_OUTPATIENT_CLINIC_OR_DEPARTMENT_OTHER): Payer: Medicare Other

## 2013-03-19 ENCOUNTER — Telehealth: Payer: Self-pay | Admitting: Oncology

## 2013-03-19 VITALS — BP 129/77 | HR 85 | Temp 98.1°F | Resp 18 | Ht 67.0 in | Wt 158.5 lb

## 2013-03-19 DIAGNOSIS — D0590 Unspecified type of carcinoma in situ of unspecified breast: Secondary | ICD-10-CM

## 2013-03-19 DIAGNOSIS — Z5111 Encounter for antineoplastic chemotherapy: Secondary | ICD-10-CM

## 2013-03-19 DIAGNOSIS — C801 Malignant (primary) neoplasm, unspecified: Secondary | ICD-10-CM

## 2013-03-19 DIAGNOSIS — C786 Secondary malignant neoplasm of retroperitoneum and peritoneum: Secondary | ICD-10-CM

## 2013-03-19 DIAGNOSIS — C569 Malignant neoplasm of unspecified ovary: Secondary | ICD-10-CM

## 2013-03-19 DIAGNOSIS — Z170 Estrogen receptor positive status [ER+]: Secondary | ICD-10-CM

## 2013-03-19 DIAGNOSIS — F341 Dysthymic disorder: Secondary | ICD-10-CM

## 2013-03-19 LAB — COMPREHENSIVE METABOLIC PANEL (CC13)
ALBUMIN: 3.6 g/dL (ref 3.5–5.0)
ALT: 38 U/L (ref 0–55)
AST: 30 U/L (ref 5–34)
Alkaline Phosphatase: 80 U/L (ref 40–150)
Anion Gap: 13 mEq/L — ABNORMAL HIGH (ref 3–11)
BUN: 13.5 mg/dL (ref 7.0–26.0)
CALCIUM: 9.5 mg/dL (ref 8.4–10.4)
CHLORIDE: 108 meq/L (ref 98–109)
CO2: 20 mEq/L — ABNORMAL LOW (ref 22–29)
Creatinine: 0.7 mg/dL (ref 0.6–1.1)
GLUCOSE: 101 mg/dL (ref 70–140)
POTASSIUM: 3.9 meq/L (ref 3.5–5.1)
Sodium: 141 mEq/L (ref 136–145)
Total Bilirubin: 0.34 mg/dL (ref 0.20–1.20)
Total Protein: 6.7 g/dL (ref 6.4–8.3)

## 2013-03-19 LAB — CBC WITH DIFFERENTIAL/PLATELET
BASO%: 3 % — ABNORMAL HIGH (ref 0.0–2.0)
BASOS ABS: 0.1 10*3/uL (ref 0.0–0.1)
EOS ABS: 0.1 10*3/uL (ref 0.0–0.5)
EOS%: 2.1 % (ref 0.0–7.0)
HCT: 31.7 % — ABNORMAL LOW (ref 34.8–46.6)
HEMOGLOBIN: 9.6 g/dL — AB (ref 11.6–15.9)
LYMPH#: 1.2 10*3/uL (ref 0.9–3.3)
LYMPH%: 34.9 % (ref 14.0–49.7)
MCH: 31.6 pg (ref 25.1–34.0)
MCHC: 30.3 g/dL — ABNORMAL LOW (ref 31.5–36.0)
MCV: 104.3 fL — ABNORMAL HIGH (ref 79.5–101.0)
MONO#: 0.4 10*3/uL (ref 0.1–0.9)
MONO%: 10.7 % (ref 0.0–14.0)
NEUT#: 1.7 10*3/uL (ref 1.5–6.5)
NEUT%: 49.3 % (ref 38.4–76.8)
Platelets: 359 10*3/uL (ref 145–400)
RBC: 3.04 10*6/uL — ABNORMAL LOW (ref 3.70–5.45)
RDW: 19.5 % — AB (ref 11.2–14.5)
WBC: 3.4 10*3/uL — AB (ref 3.9–10.3)

## 2013-03-19 LAB — TECHNOLOGIST REVIEW: Technologist Review: 2

## 2013-03-19 MED ORDER — SODIUM CHLORIDE 0.9 % IV SOLN
Freq: Once | INTRAVENOUS | Status: AC
  Administered 2013-03-19: 12:00:00 via INTRAVENOUS

## 2013-03-19 MED ORDER — SODIUM CHLORIDE 0.9 % IV SOLN
7.0000 mg | Freq: Once | INTRAVENOUS | Status: AC
  Administered 2013-03-19: 7 mg via INTRAVENOUS
  Filled 2013-03-19: qty 7

## 2013-03-19 MED ORDER — ONDANSETRON 8 MG/NS 50 ML IVPB
INTRAVENOUS | Status: AC
  Filled 2013-03-19: qty 8

## 2013-03-19 MED ORDER — SODIUM CHLORIDE 0.9 % IJ SOLN
10.0000 mL | INTRAMUSCULAR | Status: DC | PRN
  Administered 2013-03-19: 10 mL
  Filled 2013-03-19: qty 10

## 2013-03-19 MED ORDER — ONDANSETRON 8 MG/50ML IVPB (CHCC)
8.0000 mg | Freq: Once | INTRAVENOUS | Status: AC
  Administered 2013-03-19: 8 mg via INTRAVENOUS

## 2013-03-19 MED ORDER — HEPARIN SOD (PORK) LOCK FLUSH 100 UNIT/ML IV SOLN
500.0000 [IU] | Freq: Once | INTRAVENOUS | Status: AC | PRN
  Administered 2013-03-19: 500 [IU]
  Filled 2013-03-19: qty 5

## 2013-03-19 NOTE — Patient Instructions (Signed)
[facility] Discharge Instructions for Patients Receiving Chemotherapy  Today you received the following chemotherapy agents :  Topotecan.  To help prevent nausea and vomiting after your treatment, we encourage you to take your nausea medication as instructed by your physician.   If you develop nausea and vomiting that is not controlled by your nausea medication, call the clinic.   BELOW ARE SYMPTOMS THAT SHOULD BE REPORTED IMMEDIATELY:  *FEVER GREATER THAN 100.5 F  *CHILLS WITH OR WITHOUT FEVER  NAUSEA AND VOMITING THAT IS NOT CONTROLLED WITH YOUR NAUSEA MEDICATION  *UNUSUAL SHORTNESS OF BREATH  *UNUSUAL BRUISING OR BLEEDING  TENDERNESS IN MOUTH AND THROAT WITH OR WITHOUT PRESENCE OF ULCERS  *URINARY PROBLEMS  *BOWEL PROBLEMS  UNUSUAL RASH Items with * indicate a potential emergency and should be followed up as soon as possible.  Feel free to call the clinic you have any questions or concerns. The clinic phone number is [phone].

## 2013-03-19 NOTE — Progress Notes (Addendum)
[place]  Telephone:([phone] [phone]  OFFICE PROGRESS NOTE   PCP: Aris Georgia, M.D. SU: Rolm Bookbinder, M.D. RAD ONC: Gery Pray, M.D.   DIAGNOSIS: [name] [name] is a 68 year-old female with a history of ductal carcinoma in situ of the left breast diagnosed 04/2009 and ovarian cancer diagnosed in 12/2011.  PRIOR THERAPY: #1. 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 Patient had 3 sentinel biopsied all of them were negative for metastatic disease. Patient underwent radiation therapy between 06/05/2009 through 07/02/2009. She was then begun on Tamoxifen 20 mg daily in 09/2009.  #2  In 12/2011 she received the diagnosis of gynecologic malignancy presenting with abdominal mass and pleural effusions. Patient was originally hospitalized with shortness of breath in 12/2011.  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 She also had malignant ascites.  She underwent bilateral thoracentesis and paracentesis procedures during her hospitalization. During her hospitalization she was seen by gynecologic oncology.  #3 Patient began neoadjuvant chemotherapy consisting of Taxol and Carboplatinum. Her first cycle was administered during her hospitalization. She has completed a total of 6 cycles of Taxol/Carboplatinum from 01/07/2012 - 06/23/2012. Overall she tolerated it well except for some fatigue and neuropathies.  #4 Patient is status post laparotomy in 05/2012 that revealed significant residual disease.   #5  Patient began adjuvant therapy with Topotecan/Avastin starting on 07/24/2012.  CURRENT THERAPY: Cycle 9 day 8 of Topotecan given on day 1, 8, and 15, every 28 days, and Avastin every 21 days  INTERVAL HISTORY:  [name] 68 y.o. female returns today for for initiation of cycle 9 day 8 of Topotecan  therapy and Avastin therapy.  She is doing well today.  She was evaluated by an ophthalmologist for blurred vision and she was diagnosed with steroid induced cataracts.  She would like the MRI of her brain canceled.  She denies fevers, chills, nausea, vomiting, constipation, diarrhea, numbness, skin changes or any other concerns.  Her ophthalmologist has recommended upcoming surgery on her eyes.  She doesn't have a date for this.  Otherwise, a 10 point ROS is neg.   MEDICAL HISTORY: Past Medical History  Diagnosis Date  . Interstitial cystitis     on chronic antibiotics  . Hyperlipidemia   . Frequent UTI     on prophylaxis  . 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 CAD (coronary artery disease)     a. s/p CABG 7/13;   b.  LHC 12/01/11:  pLAD 70%, mLAD 40%, CFX 40-50% prior to takeoff of the OM2, oRCA occluded, mid vessel filled via R->R collaterals and distal vessel filled by L->R collaterals, S-OM1/OM2 (small and diffusely dz) with mid 90% stenosis, 90% at OM1 anastomotic site, continuation of OM2 occluded, S-Dx patent, S-PDA occluded, L-LAD ok, EF 55-65%  => Med Rx rec.  [name] Hx of echocardiogram     Echo 5/13: EF [id number], grade 2 diastolic dysfunction  . Hypercholesterolemia   . Pleural effusion 12/28/11    s/p pleurx catheter  . GERD (gastroesophageal reflux disease)   . Arthritis     "just a little; lower back" (12/28/11)  . Gout attack 08/2011    related to "stress post OHS"  . Depression   . Breast cancer     "left"; on Tamoxifen  . S/P thoracentesis 12/28/11    "for pleural effusion" (12/28/2011)  .  Ovarian cancer   . Tachycardia   . Neuromuscular disorder     peripheral neuropathy    ALLERGIES:  is allergic to codeine; isosorbide; other; phenothiazines; talwin; yellow jacket venom; ambien; and tegaderm ag mesh.  MEDICATIONS:  Current Outpatient Prescriptions  Medication Sig Dispense Refill  . ALPRAZolam (XANAX) 0.25 MG tablet Take 1 tablet (0.25 mg total) by mouth every 8 (eight)  hours as needed for anxiety.  30 tablet  4  . Alum & Mag Hydroxide-Simeth (MAGIC MOUTHWASH) SOLN Take 5 mLs by mouth 4 (four) times daily. Take 5 ml by mouth four times daily as needed.  240 mL  2  . aspirin 325 MG tablet Take 325 mg by mouth at bedtime.       [name] atorvastatin (LIPITOR) 20 MG tablet TAKE ONE TABLET BY MOUTH ONCE DAILY  90 tablet  3  . bisoprolol (ZEBETA) 10 MG tablet Take 1 tablet (10 mg total) by mouth daily.  30 tablet  6  . docusate sodium (COLACE) 100 MG capsule Take 200 mg by mouth daily.      . famotidine (PEPCID) 20 MG tablet Take 20 mg by mouth. Take 1 tablet at bedtime by mouth      . fluticasone (FLONASE) 50 MCG/ACT nasal spray Place 2 sprays into the nose daily as needed for allergies.      [name] gabapentin (NEURONTIN) 100 MG capsule Take 100 mg orally three times a day  90 capsule  6  . lidocaine-prilocaine (EMLA) cream Apply topically as needed.  30 g  1  . loratadine (CLARITIN) 10 MG tablet Take 10 mg by mouth. Take 1 tablet daily as needed by mouth      . omeprazole (PRILOSEC) 20 MG capsule Take 40 mg by mouth daily.      [name] oxybutynin (DITROPAN-XL) 10 MG 24 hr tablet Take 10 mg by mouth daily.      Vladimir Faster Glycol-Propyl Glycol 0.4-0.3 % SOLN Place 1 drop into both eyes every morning. As needed      . senna (SENOKOT) 8.6 MG tablet Take 1 tablet by mouth daily as needed for constipation.      . SF 5000 PLUS 1.1 % CREA dental cream Place 1.1 % onto teeth at bedtime.       [name] trimethoprim (TRIMPEX) 100 MG tablet Take 100 mg by mouth every morning.       . valACYclovir (VALTREX) 500 MG tablet Take 1 tablet (500 mg total) by mouth daily.  30 tablet  7  . venlafaxine XR (EFFEXOR-XR) 75 MG 24 hr capsule Take 75 mg  Orally daily  30 capsule  8  . dexamethasone (DECADRON) 4 MG tablet Take 2 tablets (8 mg total) by mouth 2 (two) times daily with a meal. Take daily starting the day after chemotherapy for 2 days. Take with food.  30 tablet  1  . EPINEPHrine (EPIPEN 2-PAK) 0.3  mg/0.3 mL DEVI Inject 0.3 mg into the muscle daily as needed (allergic reaction).       . nitroGLYCERIN (NITROSTAT) 0.4 MG SL tablet Place 0.4 mg under the tongue every 5 (five) minutes as needed. Chest pain      . ondansetron (ZOFRAN) 8 MG tablet Take 1 tablet (8 mg total) by mouth 2 (two) times daily. Take two times a day starting the day after chemo for 2 days. Then take two times a day as needed for nausea or vomiting.  30 tablet  1  . prochlorperazine (COMPAZINE) 25 MG  suppository Place 1 suppository (25 mg total) rectally every 12 (twelve) hours as needed for nausea.  12 suppository  3   No current facility-administered medications for this visit.   Facility-Administered Medications Ordered in Other Visits  Medication Dose Route Frequency Provider Last Rate Last Dose  . influenza  inactive virus vaccine (FLUZONE/FLUARIX) injection 0.5 mL  0.5 mL Intramuscular Once Rita Ohara, MD      . sodium chloride 0.9 % injection 10 mL  10 mL Intracatheter PRN Deatra Robinson, MD   10 mL at 02/28/13 1604  . sodium chloride 0.9 % injection 10 mL  10 mL Intracatheter PRN Deatra Robinson, MD   10 mL at 03/19/13 1401    SURGICAL HISTORY:  Past Surgical History  Procedure Laterality Date  . Breast lumpectomy  04/2009    left  . Cesarean section  1973; 1976  . Muscle release  1960    L neck and chest.; "when I was 13; pneumonia settled in my left neck"  . Coronary artery bypass graft  08/18/2011    Procedure: CORONARY ARTERY BYPASS GRAFTING (CABG);  Surgeon: Gaye Pollack, MD;  Location: [place];  Service: Open Heart Surgery;  Laterality: N/A;  Coronary Artery Bypass Graft times five utilizing the left internal mammary artery and the left greater saphenous vein harvested endoscopically.  . Abdominal hysterectomy  1976  . Appendectomy  1976  . Portacath placement  01/06/2012    Procedure: INSERTION PORT-A-CATH;  Surgeon: Gaye Pollack, MD;  Location: [facility];  Service: Thoracic;  Laterality: Left;  . Chest  tube insertion  01/06/2012    Procedure: INSERTION PLEURAL DRAINAGE CATHETER;  Surgeon: Gaye Pollack, MD;  Location: [facility];  Service: Thoracic;  Laterality: Bilateral;  . Removal of pleural drainage catheter Right 04/12/2012    Procedure: MINOR REMOVAL OF PLEURAL DRAINAGE CATHETER;  Surgeon: Gaye Pollack, MD;  Location: [place] [facility];  Service: Thoracic;  Laterality: Right;  . Talc pleurodesis Left 04/12/2012    Procedure: Pietro Cassis;  Surgeon: Gaye Pollack, MD;  Location: [place];  Service: Thoracic;  Laterality: Left;  . Portacath placement Left 04/17/2012    Procedure: INSERTION PORT-A-CATH;  Surgeon: Gaye Pollack, MD;  [facility];  Service: Thoracic;  Laterality: Left;  . Removal of pleural drainage catheter Left 04/17/2012    Procedure: REMOVAL OF PLEURAL DRAINAGE CATHETER;  Surgeon: Gaye Pollack, MD;  Location: [place];  Service: Thoracic;  Laterality: Left;  . Port-a-cath removal Left 04/17/2012    Procedure: REMOVAL PORT-A-CATH;  Surgeon: Gaye Pollack, MD;  Location: [facility];  Service: Thoracic;  Laterality: Left;  . Cardiac catheterization    . Laparotomy Bilateral 05/30/2012    Procedure: Resection of umbilical mass, Partial omentectomy;  Surgeon: Alvino Chapel, MD;  Location: [facility];  Service: Gynecology;  Laterality: Bilateral;   REVIEW OF SYSTEMS:   A 10 point review of systems was completed and is negative except as noted above.  PHYSICAL EXAMINATION:  BP 129/77  Pulse 85  Temp(Src) 98.1 F (36.7 C)  Resp 18  Ht 5\' 7"  (1.702 m)  Wt 158 lb 8 oz (71.895 kg)  BMI 24.82 kg/m2  GENERAL: Patient is a well appearing female in no acute distress HEENT:  Sclerae anicteric.  Oropharynx clear and moist. No ulcerations or evidence of oropharyngeal candidiasis. Neck is supple.  NODES:  No cervical, supraclavicular, or axillary lymphadenopathy palpated.  BREAST EXAM:  deferred LUNGS:  Clear to auscultation  bilaterally.  No wheezes or rhonchi. HEART:  Regular rate and rhythm.  No murmur appreciated. ABDOMEN:  Soft, nontender.  Positive, normoactive bowel sounds. No organomegaly palpated. MSK:  No focal spinal tenderness to palpation. Full range of motion bilaterally in the upper extremities. EXTREMITIES:  No peripheral edema.   SKIN:  Clear with no obvious rashes or skin changes. No nail dyscrasia. NEURO:  Nonfocal. Well oriented.  Appropriate affect. ECOG PERFORMANCE STATUS: 1 - Symptomatic but completely ambulatory.   LABORATORY DATA: Lab Results  Component Value Date   WBC 3.4* 03/19/2013   HGB 9.6* 03/19/2013   HCT 31.7* 03/19/2013   MCV 104.3* 03/19/2013   PLT 359 03/19/2013      Chemistry      Component Value Date/Time   NA 141 03/19/2013 1007   NA 134* 06/02/2012 0515   NA 141 12/17/2011 1144   K 3.9 03/19/2013 1007   K 4.4 06/02/2012 0515   CL 103 08/07/2012 1108   CL 100 06/02/2012 0515   CO2 20* 03/19/2013 1007   CO2 25 06/02/2012 0515   BUN 13.5 03/19/2013 1007   BUN 20 06/02/2012 0515   BUN 15 12/17/2011 1144   CREATININE 0.7 03/19/2013 1007   CREATININE 0.76 06/02/2012 0515      Component Value Date/Time   CALCIUM 9.5 03/19/2013 1007   CALCIUM 8.7 06/02/2012 0515   ALKPHOS 80 03/19/2013 1007   ALKPHOS 85 05/26/2012 0955   AST 30 03/19/2013 1007   AST 22 05/26/2012 0955   ALT 38 03/19/2013 1007   ALT 16 05/26/2012 0955   BILITOT 0.34 03/19/2013 1007   BILITOT 0.3 05/26/2012 0955       RADIOGRAPHIC STUDIES: Dg Chest 2 View 08/30/2012   *RADIOLOGY REPORT*  Clinical Data: Shortness of breath, history metastatic ovarian carcinoma and history of bilateral pleural effusions.  CHEST - 2 VIEW  Comparison: 05/26/2012  Findings: There is significant diminishment in bilateral pleural effusions since the prior study with only trace amount of pleural fluid present bilaterally.  The lungs show no evidence of focal consolidation, masses or pulmonary edema.  Port-A-Cath positioning stable in the SVC.  The heart size is normal.  No bony lesions are seen.  IMPRESSION: Significant  diminishment in bilateral pleural effusions since the prior chest x-ray. Trace bilateral pleural effusions remain.  No acute findings.   Original Report Authenticated By: Irish Lack, M.D.     ASSESSMENT: 68 year old woman:   #1 History of DCIS of the left breast status post lumpectomy in 04/2009.  Status post radiation therapy completed in 06/2009.  Was on antiestrogen therapy with Tamoxifen 20 mg by mouth daily that was started in 09/2009.  Tamoxifen has been discontinued for now due to her recent diagnosis of ovarian cancer.  #2 Gynecologic malignancy (ovarian carcinoma) diagnosed in 12/2011. Patient received 6 cycles of neoadjuvant chemotherapy consisting of Taxol/Carboplatinum completed in 06/2012.  #3 She is status post laparotomy performed on 05/30/2012. Unfortunately, intraoperatively she was found to have significant residual disease. 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   #4 Patient completed chemotherapy sensitivity testing. Unfortunately the results were inconclusive.  It was decided to proceed with chemotherapy consisting of Topotecan and Avastin.  Dr. Grant Ruts also recommended this treatment regimen.  #5 Patient was started chemotherapy consisting of Topotecan and Avastin  With Topotecan q week on day 1, 8,15 on a 28 day cycle, and Avastin scheduled for every 3  weeks.  #6 Depression/anxiety: Currently on Effexor with good response  #7 Shortness of breath: patient has been seen by cardilogist Dr. Acie Fredrickson and cardiac etiology was ruled out. Patient followed by Dr. Melvyn Novas, last appt on 11/23/12 and he recommended a re-conditioning and exercise plan along with dietary modifications, she then followed up with cardiology in December, 2014, and changes were made to her cardiac medications which helped.   #8 Neuropathy - currently on Gabapentin 100 mg by mouth 3 times a day.  Neuropathy is currently  resolved with this.     PLAN: #1 Patient is doing well today. She will proceed with cycle 9 day 8 of Topotecan today.   Her CBC is stable.  I reviewed it with her in detail.  Her CMP is pending.   #2 We will need to hold the Avastin 28 days prior to and following her cataract surgery.  This has not yet been planned.  She will start discussing times and dates with her ophthalmologist and we will plan accordingly.  I canceled the MRI brain.    #3 She will return to see [place] next week for labs, evaluation, and cycle 9 day 15 of Topotecan chemotherapy.    All questions were answered.  Patient was encouraged to contact us with any questions, problems or concerns.   I spent 25 minutes counseling the patient face to face.  The total time spent in the appointment was 30 minutes.  Minette Headland, [place] [phone] 03/19/2013, 2:53 PM   ATTENDING'S ATTESTATION:  I personally reviewed patient's chart, examined patient myself, formulated the treatment plan as followed.    Patient was recently seen by ophthalmology for blurring of vision. 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 Eventual plan is for her to have cataract surgery. I discussed the situation with the patient. She is on Avastin certainly we will have to hold this at least of 28 days prior to the surgery and then hold it off towards as well. Patient is gong to call us with the date and time of her cataract extraction and we will plan her treatments accordingly.  In the meantime she will proceed with cycle 9 of her treatment today  Marcy Panning, MD Medical/Oncology [facility] [phone] (beeper) [phone] (Office)  03/22/2013, 10:51 AM

## 2013-03-19 NOTE — Telephone Encounter (Signed)
Open by misake 

## 2013-03-19 NOTE — Telephone Encounter (Signed)
Per staff message and POF I have scheduled appts. Advised scheduler that 2pm on 2/9 was first availbakle  JMW

## 2013-03-23 ENCOUNTER — Ambulatory Visit (HOSPITAL_COMMUNITY): Payer: Medicare Other

## 2013-03-26 ENCOUNTER — Encounter: Payer: Self-pay | Admitting: Oncology

## 2013-03-26 ENCOUNTER — Other Ambulatory Visit (HOSPITAL_BASED_OUTPATIENT_CLINIC_OR_DEPARTMENT_OTHER): Payer: Medicare Other

## 2013-03-26 ENCOUNTER — Ambulatory Visit (HOSPITAL_BASED_OUTPATIENT_CLINIC_OR_DEPARTMENT_OTHER): Payer: BC Managed Care – PPO

## 2013-03-26 ENCOUNTER — Ambulatory Visit (HOSPITAL_COMMUNITY)
Admission: RE | Admit: 2013-03-26 | Discharge: 2013-03-26 | Disposition: A | Discharge status: Home / Self Care | Payer: Medicare Other | Source: Ambulatory Visit | Attending: Hematology & Oncology | Admitting: Hematology & Oncology

## 2013-03-26 ENCOUNTER — Telehealth: Payer: Self-pay | Admitting: Oncology

## 2013-03-26 ENCOUNTER — Ambulatory Visit: Payer: Medicare Other

## 2013-03-26 ENCOUNTER — Ambulatory Visit (HOSPITAL_BASED_OUTPATIENT_CLINIC_OR_DEPARTMENT_OTHER): Payer: Medicare Other | Admitting: Oncology

## 2013-03-26 VITALS — BP 150/81 | HR 77 | Temp 98.4°F | Resp 18

## 2013-03-26 VITALS — BP 130/75 | HR 83 | Temp 98.1°F | Resp 20 | Ht 67.0 in | Wt 157.3 lb

## 2013-03-26 DIAGNOSIS — R0602 Shortness of breath: Secondary | ICD-10-CM

## 2013-03-26 DIAGNOSIS — T451X5A Adverse effect of antineoplastic and immunosuppressive drugs, initial encounter: Secondary | ICD-10-CM

## 2013-03-26 DIAGNOSIS — D630 Anemia in neoplastic disease: Secondary | ICD-10-CM | POA: Insufficient documentation

## 2013-03-26 DIAGNOSIS — C786 Secondary malignant neoplasm of retroperitoneum and peritoneum: Secondary | ICD-10-CM

## 2013-03-26 DIAGNOSIS — D0590 Unspecified type of carcinoma in situ of unspecified breast: Secondary | ICD-10-CM

## 2013-03-26 DIAGNOSIS — Z5111 Encounter for antineoplastic chemotherapy: Secondary | ICD-10-CM

## 2013-03-26 DIAGNOSIS — Z170 Estrogen receptor positive status [ER+]: Secondary | ICD-10-CM

## 2013-03-26 DIAGNOSIS — C569 Malignant neoplasm of unspecified ovary: Secondary | ICD-10-CM

## 2013-03-26 DIAGNOSIS — D6481 Anemia due to antineoplastic chemotherapy: Secondary | ICD-10-CM

## 2013-03-26 DIAGNOSIS — C801 Malignant (primary) neoplasm, unspecified: Secondary | ICD-10-CM

## 2013-03-26 DIAGNOSIS — C50919 Malignant neoplasm of unspecified site of unspecified female breast: Secondary | ICD-10-CM

## 2013-03-26 LAB — CBC WITH DIFFERENTIAL/PLATELET
BASO%: 1.3 % (ref 0.0–2.0)
Basophils Absolute: 0 10*3/uL (ref 0.0–0.1)
EOS%: 1.6 % (ref 0.0–7.0)
Eosinophils Absolute: 0.1 10*3/uL (ref 0.0–0.5)
HCT: 29.8 % — ABNORMAL LOW (ref 34.8–46.6)
HGB: 9.1 g/dL — ABNORMAL LOW (ref 11.6–15.9)
LYMPH%: 24.8 % (ref 14.0–49.7)
MCH: 31.1 pg (ref 25.1–34.0)
MCHC: 30.5 g/dL — AB (ref 31.5–36.0)
MCV: 101.7 fL — ABNORMAL HIGH (ref 79.5–101.0)
MONO#: 0.3 10*3/uL (ref 0.1–0.9)
MONO%: 8.8 % (ref 0.0–14.0)
NEUT#: 2 10*3/uL (ref 1.5–6.5)
NEUT%: 63.5 % (ref 38.4–76.8)
NRBC: 10 % — AB (ref 0–0)
PLATELETS: 130 10*3/uL — AB (ref 145–400)
RBC: 2.93 10*6/uL — ABNORMAL LOW (ref 3.70–5.45)
RDW: 19.5 % — ABNORMAL HIGH (ref 11.2–14.5)
WBC: 3.2 10*3/uL — ABNORMAL LOW (ref 3.9–10.3)
lymph#: 0.8 10*3/uL — ABNORMAL LOW (ref 0.9–3.3)

## 2013-03-26 LAB — HOLD TUBE, BLOOD BANK

## 2013-03-26 LAB — COMPREHENSIVE METABOLIC PANEL (CC13)
ALK PHOS: 92 U/L (ref 40–150)
ALT: 32 U/L (ref 0–55)
AST: 29 U/L (ref 5–34)
Albumin: 3.8 g/dL (ref 3.5–5.0)
Anion Gap: 12 mEq/L — ABNORMAL HIGH (ref 3–11)
BUN: 10.4 mg/dL (ref 7.0–26.0)
CO2: 23 mEq/L (ref 22–29)
Calcium: 9.8 mg/dL (ref 8.4–10.4)
Chloride: 108 mEq/L (ref 98–109)
Creatinine: 0.7 mg/dL (ref 0.6–1.1)
Glucose: 101 mg/dl (ref 70–140)
Potassium: 4.2 mEq/L (ref 3.5–5.1)
Sodium: 143 mEq/L (ref 136–145)
Total Bilirubin: 0.49 mg/dL (ref 0.20–1.20)
Total Protein: 6.8 g/dL (ref 6.4–8.3)

## 2013-03-26 LAB — PREPARE RBC (CROSSMATCH)

## 2013-03-26 MED ORDER — ACETAMINOPHEN 325 MG PO TABS
650.0000 mg | ORAL_TABLET | Freq: Once | ORAL | Status: AC
  Administered 2013-03-26: 650 mg via ORAL

## 2013-03-26 MED ORDER — ONDANSETRON 8 MG/50ML IVPB (CHCC)
8.0000 mg | Freq: Once | INTRAVENOUS | Status: AC
  Administered 2013-03-26: 8 mg via INTRAVENOUS

## 2013-03-26 MED ORDER — HEPARIN SOD (PORK) LOCK FLUSH 100 UNIT/ML IV SOLN
500.0000 [IU] | Freq: Once | INTRAVENOUS | Status: AC | PRN
  Administered 2013-03-26: 500 [IU]
  Filled 2013-03-26: qty 5

## 2013-03-26 MED ORDER — ALTEPLASE 2 MG IJ SOLR
2.0000 mg | Freq: Once | INTRAMUSCULAR | Status: AC | PRN
  Administered 2013-03-26: 2 mg
  Filled 2013-03-26: qty 2

## 2013-03-26 MED ORDER — DIPHENHYDRAMINE HCL 25 MG PO CAPS
ORAL_CAPSULE | ORAL | Status: AC
  Filled 2013-03-26: qty 1

## 2013-03-26 MED ORDER — TOPOTECAN HCL CHEMO INJECTION 4 MG
7.0000 mg | Freq: Once | INTRAVENOUS | Status: AC
  Administered 2013-03-26: 7 mg via INTRAVENOUS
  Filled 2013-03-26: qty 7

## 2013-03-26 MED ORDER — ONDANSETRON 8 MG/NS 50 ML IVPB
INTRAVENOUS | Status: AC
  Filled 2013-03-26: qty 8

## 2013-03-26 MED ORDER — SODIUM CHLORIDE 0.9 % IJ SOLN
10.0000 mL | INTRAMUSCULAR | Status: DC | PRN
  Administered 2013-03-26: 10 mL
  Filled 2013-03-26: qty 10

## 2013-03-26 MED ORDER — DIPHENHYDRAMINE HCL 25 MG PO CAPS
25.0000 mg | ORAL_CAPSULE | Freq: Once | ORAL | Status: AC
  Administered 2013-03-26: 25 mg via ORAL

## 2013-03-26 MED ORDER — ACETAMINOPHEN 325 MG PO TABS
ORAL_TABLET | ORAL | Status: AC
  Filled 2013-03-26: qty 2

## 2013-03-26 MED ORDER — SODIUM CHLORIDE 0.9 % IV SOLN
Freq: Once | INTRAVENOUS | Status: AC
  Administered 2013-03-26: 12:00:00 via INTRAVENOUS

## 2013-03-26 NOTE — Patient Instructions (Signed)
[place] Discharge Instructions for Patients Receiving Chemotherapy  Today you received the following chemotherapy agents topotecan.  To help prevent nausea and vomiting after your treatment, we encourage you to take your nausea medication Compazine suppositories 25mg  1 per rectum every 6 hours as needed for nausea or vomiting and Zofran 8mg  every 12 hours as needed for nausea or vomiting   If you develop nausea and vomiting that is not controlled by your nausea medication, call the clinic.   BELOW ARE SYMPTOMS THAT SHOULD BE REPORTED IMMEDIATELY:  *FEVER GREATER THAN 100.5 F  *CHILLS WITH OR WITHOUT FEVER  NAUSEA AND VOMITING THAT IS NOT CONTROLLED WITH YOUR NAUSEA MEDICATION  *UNUSUAL SHORTNESS OF BREATH  *UNUSUAL BRUISING OR BLEEDING  TENDERNESS IN MOUTH AND THROAT WITH OR WITHOUT PRESENCE OF ULCERS  *URINARY PROBLEMS  *BOWEL PROBLEMS  UNUSUAL RASH Items with * indicate a potential emergency and should be followed up as soon as possible.  Feel free to call the clinic you have any questions or concerns. The clinic phone number is [phone].   Blood Transfusion Information WHAT IS A BLOOD TRANSFUSION? A transfusion is the replacement of blood or some of its parts. Blood is made up of multiple cells which provide different functions.  Red blood cells carry oxygen and are used for blood loss replacement.  White blood cells fight against infection.  Platelets control bleeding.  Plasma helps clot blood.  Other blood products are available for specialized needs, such as hemophilia or other clotting disorders. BEFORE THE TRANSFUSION  Who gives blood for transfusions?   You may be able to donate blood to be used at a later date on yourself (autologous donation).  Relatives can be asked to donate blood. This is generally not any safer than if you have received blood from a stranger. The same precautions are taken to ensure safety when a relative's  blood is donated.  Healthy volunteers who are fully evaluated to make sure their blood is safe. This is blood bank blood. Transfusion therapy is the safest it has ever been in the practice of medicine. Before blood is taken from a donor, a complete history is taken to make sure that person has no history of diseases nor engages in risky social behavior (examples are intravenous drug use or sexual activity with multiple partners). The donor's travel history is screened to minimize risk of transmitting infections, such as malaria. The donated blood is tested for signs of infectious diseases, such as HIV and hepatitis. The blood is then tested to be sure it is compatible with you in order to minimize the chance of a transfusion reaction. If you or a relative donates blood, this is often done in anticipation of surgery and is not appropriate for emergency situations. It takes many days to process the donated blood. RISKS AND COMPLICATIONS Although transfusion therapy is very safe and saves many lives, the main dangers of transfusion include:   Getting an infectious disease.  Developing a transfusion reaction. This is an allergic reaction to something in the blood you were given. Every precaution is taken to prevent this. The decision to have a blood transfusion has been considered carefully by your caregiver before blood is given. Blood is not given unless the benefits outweigh the risks. AFTER THE TRANSFUSION  Right after receiving a blood transfusion, you will usually feel much better and more energetic. This is especially true if your red blood cells have gotten low (anemic). The transfusion raises the level  of the red blood cells which carry oxygen, and this usually causes an energy increase.  The nurse administering the transfusion will monitor you carefully for complications. HOME CARE INSTRUCTIONS  No special instructions are needed after a transfusion. You may find your energy is better. Speak  with your caregiver about any limitations on activity for underlying diseases you may have. SEEK MEDICAL CARE IF:   Your condition is not improving after your transfusion.  You develop redness or irritation at the intravenous (IV) site. SEEK IMMEDIATE MEDICAL CARE IF:  Any of the following symptoms occur over the next 12 hours:  Shaking chills.  You have a temperature by mouth above 102 F (38.9 C), not controlled by medicine.  Chest, back, or muscle pain.  People around you feel you are not acting correctly or are confused.  Shortness of breath or difficulty breathing.  Dizziness and fainting.  You get a rash or develop hives.  You have a decrease in urine output.  Your urine turns a dark color or changes to pink, red, or brown. Any of the following symptoms occur over the next 10 days:  You have a temperature by mouth above 102 F (38.9 C), not controlled by medicine.  Shortness of breath.  Weakness after normal activity.  The white part of the eye turns yellow (jaundice).  You have a decrease in the amount of urine or are urinating less often.  Your urine turns a dark color or changes to pink, red, or brown. Document Released: 01/30/2000 Document Revised: 04/26/2011 Document Reviewed: 09/18/2007 [facility] Patient Information 2014 [place].

## 2013-03-26 NOTE — Progress Notes (Signed)
Called spouse to pick her up.  Discharged, ambulatory in no distress to home at 1805.

## 2013-03-26 NOTE — Telephone Encounter (Signed)
, °

## 2013-03-26 NOTE — Progress Notes (Signed)
Blood continues at 185 ml/hr and tolerating well.

## 2013-03-26 NOTE — Progress Notes (Signed)
Second unit blood ended.  C/O itching to left anterior neck.  Noted red 3.5" x 1.5 " are of redness.  Itching stopped as soon as reported to nurse.  Area flat and smooth to touch by this nurse and patient.  0.9% normal saline infusing.  VSS.

## 2013-03-26 NOTE — Progress Notes (Signed)
1st unit blood ended and tolerated well.

## 2013-03-26 NOTE — Patient Instructions (Signed)

## 2013-03-26 NOTE — Progress Notes (Signed)
Second unit blood started at 1540 at 50 ml/hr x 13 ml..  [name] is doing well.  Rate increased to 185 ml/hr at this time.

## 2013-03-26 NOTE — Progress Notes (Signed)
1st unit blood started at 50 ml/hr x 49ml at 1300.  VSS, denes symptoms, no reaction.  Rate increased to 185 ml/hr

## 2013-03-26 NOTE — Progress Notes (Signed)
[name]  [phone] [phone]  OFFICE PROGRESS NOTE   PCP: Aris Georgia, M.D. SU: Rolm Bookbinder, M.D. RAD ONC: Gery Pray, M.D.   DIAGNOSIS: [name] [name] is a 68 year-old female with a history of ductal carcinoma in situ of the left breast diagnosed 04/2009 and ovarian cancer diagnosed in 12/2011.  PRIOR THERAPY: #1. 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 Patient had 3 sentinel biopsied all of them were negative for metastatic disease. Patient underwent radiation therapy between 06/05/2009 through 07/02/2009. She was then begun on Tamoxifen 20 mg daily in 09/2009.  #2  In 12/2011 she received the diagnosis of gynecologic malignancy presenting with abdominal mass and pleural effusions. Patient was originally hospitalized with shortness of breath in 12/2011.  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 She also had malignant ascites.  She underwent bilateral thoracentesis and paracentesis procedures during her hospitalization. During her hospitalization she was seen by gynecologic oncology.  #3 Patient began neoadjuvant chemotherapy consisting of Taxol and Carboplatinum. Her first cycle was administered during her hospitalization. She has completed a total of 6 cycles of Taxol/Carboplatinum from 01/07/2012 - 06/23/2012. Overall she tolerated it well except for some fatigue and neuropathies.  #4 Patient is status post laparotomy in 05/2012 that revealed significant residual disease.   #5  Patient began adjuvant therapy with Topotecan/Avastin starting on 07/24/2012.  CURRENT THERAPY: Cycle 9 day 15 of Topotecan ( day 1, 8, and 15, every 28 days, and Avastin every 21 days)  INTERVAL HISTORY:  [name] [name] 68 y.o. female returns today for for initiation of cycle 9 day 15 of Topotecan  therapy. Avastin is being held due to  patient possibly having cataract surgery in the near future.  Today she does complain of having shortness of breath. She has had this in the past. This is now only occurring. She is unable to take small steps before she gets short winded. Her hemoglobin is 9.1. She has not had any cough or hemoptysis no abdominal pain. She does have history of constipation but she does take a stool softener and she has normal bowel movements without. She is not having any nausea or vomiting. She is denying having any chest pains or palpitations no abdominal. She does have neuropathic type of pain occasionally. She continues to have great deal of difficulty seeing because of premature cataracts. This is frustrating her a great deal. She does not have any headaches. Remainder of the 10 point review of systems is negative.  MEDICAL HISTORY: Past Medical History  Diagnosis Date  . Interstitial cystitis     on chronic antibiotics  . Hyperlipidemia   . Frequent UTI     on prophylaxis  . 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 CAD (coronary artery disease)     a. s/p CABG 7/13;   b.  LHC 12/01/11:  pLAD 70%, mLAD 40%, CFX 40-50% prior to takeoff of the OM2, oRCA occluded, mid vessel filled via R->R collaterals and distal vessel filled by L->R collaterals, S-OM1/OM2 (small and diffusely dz) with mid 90% stenosis, 90% at OM1 anastomotic site, continuation of OM2 occluded, S-Dx patent, S-PDA occluded, L-LAD ok, EF 55-65%  => Med Rx rec.  [name] Hx of echocardiogram     Echo 5/13: EF 08-67%, grade 2 diastolic dysfunction  . Hypercholesterolemia   . Pleural effusion 12/28/11  s/p pleurx catheter  . GERD (gastroesophageal reflux disease)   . Arthritis     "just a little; lower back" (12/28/11)  . Gout attack 08/2011    related to "stress post OHS"  . Depression   . Breast cancer     "left"; on Tamoxifen  . S/P thoracentesis 12/28/11    "for pleural effusion" (12/28/2011)  . Ovarian cancer   .  Tachycardia   . Neuromuscular disorder     peripheral neuropathy    ALLERGIES:  is allergic to codeine; isosorbide; other; phenothiazines; talwin; yellow jacket venom; ambien; and tegaderm ag mesh.  MEDICATIONS:  Current Outpatient Prescriptions  Medication Sig Dispense Refill  . ALPRAZolam (XANAX) 0.25 MG tablet Take 1 tablet (0.25 mg total) by mouth every 8 (eight) hours as needed for anxiety.  30 tablet  4  . Alum & Mag Hydroxide-Simeth (MAGIC MOUTHWASH) SOLN Take 5 mLs by mouth 4 (four) times daily. Take 5 ml by mouth four times daily as needed.  240 mL  2  . aspirin 325 MG tablet Take 325 mg by mouth at bedtime.       [name] atorvastatin (LIPITOR) 20 MG tablet TAKE ONE TABLET BY MOUTH ONCE DAILY  90 tablet  3  . bisoprolol (ZEBETA) 10 MG tablet Take 1 tablet (10 mg total) by mouth daily.  30 tablet  6  . dexamethasone (DECADRON) 4 MG tablet Take 2 tablets (8 mg total) by mouth 2 (two) times daily with a meal. Take daily starting the day after chemotherapy for 2 days. Take with food.  30 tablet  1  . docusate sodium (COLACE) 100 MG capsule Take 200 mg by mouth daily.      [name] EPINEPHrine (EPIPEN 2-PAK) 0.3 mg/0.3 mL DEVI Inject 0.3 mg into the muscle daily as needed (allergic reaction).       . famotidine (PEPCID) 20 MG tablet Take 20 mg by mouth. Take 1 tablet at bedtime by mouth      . fluticasone (FLONASE) 50 MCG/ACT nasal spray Place 2 sprays into the nose daily as needed for allergies.      [name] gabapentin (NEURONTIN) 100 MG capsule Take 100 mg orally three times a day  90 capsule  6  . lidocaine-prilocaine (EMLA) cream Apply topically as needed.  30 g  1  . loratadine (CLARITIN) 10 MG tablet Take 10 mg by mouth. Take 1 tablet daily as needed by mouth      . nitroGLYCERIN (NITROSTAT) 0.4 MG SL tablet Place 0.4 mg under the tongue every 5 (five) minutes as needed. Chest pain      . omeprazole (PRILOSEC) 20 MG capsule Take 40 mg by mouth daily.      . ondansetron (ZOFRAN) 8 MG tablet Take 1  tablet (8 mg total) by mouth 2 (two) times daily. Take two times a day starting the day after chemo for 2 days. Then take two times a day as needed for nausea or vomiting.  30 tablet  1  . oxybutynin (DITROPAN-XL) 10 MG 24 hr tablet Take 10 mg by mouth daily.      Vladimir Faster Glycol-Propyl Glycol 0.4-0.3 % SOLN Place 1 drop into both eyes every morning. As needed      . prochlorperazine (COMPAZINE) 25 MG suppository Place 1 suppository (25 mg total) rectally every 12 (twelve) hours as needed for nausea.  12 suppository  3  . senna (SENOKOT) 8.6 MG tablet Take 1 tablet by mouth daily as needed for constipation.      [name]  SF 5000 PLUS 1.1 % CREA dental cream Place 1.1 % onto teeth at bedtime.       [name] trimethoprim (TRIMPEX) 100 MG tablet Take 100 mg by mouth every morning.       . valACYclovir (VALTREX) 500 MG tablet Take 1 tablet (500 mg total) by mouth daily.  30 tablet  7  . venlafaxine XR (EFFEXOR-XR) 75 MG 24 hr capsule Take 75 mg  Orally daily  30 capsule  8   No current facility-administered medications for this visit.   Facility-Administered Medications Ordered in Other Visits  Medication Dose Route Frequency Provider Last Rate Last Dose  . influenza  inactive virus vaccine (FLUZONE/FLUARIX) injection 0.5 mL  0.5 mL Intramuscular Once Rita Ohara, MD      . sodium chloride 0.9 % injection 10 mL  10 mL Intracatheter PRN Deatra Robinson, MD   10 mL at 02/28/13 1604    SURGICAL HISTORY:  Past Surgical History  Procedure Laterality Date  . Breast lumpectomy  04/2009    left  . Cesarean section  1973; 1976  . Muscle release  1960    L neck and chest.; "when I was 13; pneumonia settled in my left neck"  . Coronary artery bypass graft  08/18/2011    Procedure: CORONARY ARTERY BYPASS GRAFTING (CABG);  Surgeon: Gaye Pollack, MD;  Location: [place];  Service: Open Heart Surgery;  Laterality: N/A;  Coronary Artery Bypass Graft times five utilizing the left internal mammary artery and the left greater  saphenous vein harvested endoscopically.  . Abdominal hysterectomy  1976  . Appendectomy  1976  . Portacath placement  01/06/2012    Procedure: INSERTION PORT-A-CATH;  Surgeon: Gaye Pollack, MD;  Location: [place];  Service: Thoracic;  Laterality: Left;  . Chest tube insertion  01/06/2012    Procedure: INSERTION PLEURAL DRAINAGE CATHETER;  Surgeon: Gaye Pollack, MD;  Location: [facility];  Service: Thoracic;  Laterality: Bilateral;  . Removal of pleural drainage catheter Right 04/12/2012    Procedure: MINOR REMOVAL OF PLEURAL DRAINAGE CATHETER;  Surgeon: Gaye Pollack, MD;  Location: [place];  Service: Thoracic;  Laterality: Right;  . Talc pleurodesis Left 04/12/2012    Procedure: Pietro Cassis;  Surgeon: Gaye Pollack, MD;  Location: [place];  Service: Thoracic;  Laterality: Left;  . Portacath placement Left 04/17/2012    Procedure: INSERTION PORT-A-CATH;  Surgeon: Gaye Pollack, MD;  Location: [facility];  Service: Thoracic;  Laterality: Left;  . Removal of pleural drainage catheter Left 04/17/2012    Procedure: REMOVAL OF PLEURAL DRAINAGE CATHETER;  Surgeon: Gaye Pollack, MD;  Location: [place];  Service: Thoracic;  Laterality: Left;  . Port-a-cath removal Left 04/17/2012    Procedure: REMOVAL PORT-A-CATH;  Surgeon: Gaye Pollack, MD;  Location: [facility] OR;  Service: Thoracic;  Laterality: Left;  . Cardiac catheterization    . Laparotomy Bilateral 05/30/2012    Procedure: Resection of umbilical mass, Partial omentectomy;  Surgeon: Alvino Chapel, MD;  Location: [facility];  Service: Gynecology;  Laterality: Bilateral;   REVIEW OF SYSTEMS:   A 10 point review of systems was completed and is negative except as noted above.  PHYSICAL EXAMINATION:  BP 130/75  Pulse 83  Temp(Src) 98.1 F (36.7 C) (Oral)  Resp 20  Ht 5\' 7"  (1.702 m)  Wt 157 lb 4.8 oz (71.351 kg)  BMI 24.63 kg/m2  GENERAL: Patient is a well appearing female in no acute distress HEENT:  Sclerae  anicteric.  Oropharynx clear and moist.  No ulcerations or evidence of oropharyngeal candidiasis. Neck is supple.  NODES:  No cervical, supraclavicular, or axillary lymphadenopathy palpated.  BREAST EXAM:  deferred LUNGS:  Clear to auscultation bilaterally.  No wheezes or rhonchi. HEART:  Regular rate and rhythm. No murmur appreciated. ABDOMEN:  Soft, nontender.  Positive, normoactive bowel sounds. No organomegaly palpated. MSK:  No focal spinal tenderness to palpation. Full range of motion bilaterally in the upper extremities. EXTREMITIES:  No peripheral edema.   SKIN:  Clear with no obvious rashes or skin changes. No nail dyscrasia. NEURO:  Nonfocal. Well oriented.  Appropriate affect. ECOG PERFORMANCE STATUS: 1 - Symptomatic but completely ambulatory.   LABORATORY DATA: Lab Results  Component Value Date   WBC 3.2* 03/26/2013   HGB 9.1* 03/26/2013   HCT 29.8* 03/26/2013   MCV 101.7* 03/26/2013   PLT 130* 03/26/2013      Chemistry      Component Value Date/Time   NA 141 03/19/2013 1007   NA 134* 06/02/2012 0515   NA 141 12/17/2011 1144   K 3.9 03/19/2013 1007   K 4.4 06/02/2012 0515   CL 103 08/07/2012 1108   CL 100 06/02/2012 0515   CO2 20* 03/19/2013 1007   CO2 25 06/02/2012 0515   BUN 13.5 03/19/2013 1007   BUN 20 06/02/2012 0515   BUN 15 12/17/2011 1144   CREATININE 0.7 03/19/2013 1007   CREATININE 0.76 06/02/2012 0515      Component Value Date/Time   CALCIUM 9.5 03/19/2013 1007   CALCIUM 8.7 06/02/2012 0515   ALKPHOS 80 03/19/2013 1007   ALKPHOS 85 05/26/2012 0955   AST 30 03/19/2013 1007   AST 22 05/26/2012 0955   ALT 38 03/19/2013 1007   ALT 16 05/26/2012 0955   BILITOT 0.34 03/19/2013 1007   BILITOT 0.3 05/26/2012 0955       RADIOGRAPHIC STUDIES: Dg Chest 2 View 08/30/2012   *RADIOLOGY REPORT*  Clinical Data: Shortness of breath, history metastatic ovarian carcinoma and history of bilateral pleural effusions.  CHEST - 2 VIEW  Comparison: 05/26/2012  Findings: There is significant diminishment in bilateral pleural effusions since the  prior study with only trace amount of pleural fluid present bilaterally.  The lungs show no evidence of focal consolidation, masses or pulmonary edema.  Port-A-Cath positioning stable in the SVC.  The heart size is normal.  No bony lesions are seen.  IMPRESSION: Significant diminishment in bilateral pleural effusions since the prior chest x-ray. Trace bilateral pleural effusions remain.  No acute findings.   Original Report Authenticated By: Aletta Edouard, M.D.     ASSESSMENT/PLAN: 68 year old woman:   #1 History of DCIS of the left breast status post lumpectomy in 04/2009.  Status post radiation therapy completed in 06/2009.  Was on antiestrogen therapy with Tamoxifen 20 mg by mouth daily that was started in 09/2009.  Tamoxifen has been discontinued for now due to her recent diagnosis of ovarian cancer.  #2 Gynecologic malignancy (ovarian carcinoma): diagnosed in 12/2011. Patient received 6 cycles of neoadjuvant chemotherapy consisting of Taxol/Carboplatinum completed in 06/2012. She then went on to have stored for a laparotomy on 05/30/2012. She was found to have intraoperatively significant residual disease. 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 She had chemosensitivity testing performed these results were inconclusive. She therefore was begun on topotecan and Avastin. Thus far she has been tolerating it very well. She is receiving  topotecan day 1 day 8 and 15 on a 28 day cycle with Avastin given every 3 weeks.  #3 She is status post laparotomy performed on 05/30/2012. Unfortunately, intraoperatively she was found to have significant residual disease. 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   #4 Depression/anxiety: Currently on Effexor with good response  #7 Shortness of breath: patient has been seen by cardilogist Dr.  Acie Fredrickson and cardiac etiology was ruled out. Patient followed by Dr. Melvyn Novas, last appt on 11/23/12 and he recommended a re-conditioning and exercise plan along with dietary modifications, she then followed up with cardiology in December, 2014, and changes were made to her cardiac medications which helped.   #8 Neuropathy - currently on Gabapentin 100 mg by mouth 3 times a day.  Neuropathy is currently resolved with this.    #9 anemia: Secondary to chemotherapy and underlying disease. She is very symptomatic from this I do think her shortness of breath may be coming from the anemia. She has very poor reserve. I have recommended transfusing her with 2 units of packed red cells to see if she has any any relief.  #10 history of pleural effusion: I will obtain chest x-ray to rule out reaccumulation of the pleural effusion versus a primary pulmonary etiology.  #11 patient will be seen back in one week for followup and blood work.    All questions were answered.  Patient was encouraged to contact us with any questions, problems or concerns.   I spent 25 minutes counseling the patient face to face.  The total time spent in the appointment was 30 minutes.  Marcy Panning, MD Medical/Oncology [facility] [phone] (beeper) [phone] (Office)  03/26/2013, 10:12 AM

## 2013-03-26 NOTE — Progress Notes (Signed)
Pre -VS before blood transfusion wnl

## 2013-03-27 LAB — TYPE AND SCREEN
ABO/RH(D): A POS
Antibody Screen: NEGATIVE
Unit division: 0
Unit division: 0

## 2013-03-28 ENCOUNTER — Other Ambulatory Visit: Payer: Medicare Other

## 2013-03-28 ENCOUNTER — Ambulatory Visit: Payer: Medicare Other

## 2013-03-29 ENCOUNTER — Ambulatory Visit (HOSPITAL_COMMUNITY)
Admission: RE | Admit: 2013-03-29 | Discharge: 2013-03-29 | Disposition: A | Discharge status: Home / Self Care | Payer: Medicare Other | Source: Ambulatory Visit | Attending: Oncology | Admitting: Oncology

## 2013-03-29 DIAGNOSIS — J9819 Other pulmonary collapse: Secondary | ICD-10-CM | POA: Insufficient documentation

## 2013-03-29 DIAGNOSIS — C50919 Malignant neoplasm of unspecified site of unspecified female breast: Secondary | ICD-10-CM

## 2013-03-29 DIAGNOSIS — R0602 Shortness of breath: Secondary | ICD-10-CM | POA: Insufficient documentation

## 2013-03-29 DIAGNOSIS — J90 Pleural effusion, not elsewhere classified: Secondary | ICD-10-CM | POA: Insufficient documentation

## 2013-03-29 DIAGNOSIS — R059 Cough, unspecified: Secondary | ICD-10-CM | POA: Insufficient documentation

## 2013-03-29 DIAGNOSIS — R05 Cough: Secondary | ICD-10-CM | POA: Insufficient documentation

## 2013-03-30 ENCOUNTER — Telehealth: Payer: Self-pay | Admitting: *Deleted

## 2013-03-30 NOTE — Telephone Encounter (Signed)
Message copied by Hebert Soho on Fri Mar 30, 2013 12:49 PM ------      Message from: Deatra Robinson      Created: Fri Mar 30, 2013  4:36 AM       CT chest no new finding, everything is stable ------

## 2013-03-30 NOTE — Telephone Encounter (Signed)
As noted below by Dr. Humphrey Rolls, I informed patient that CT of chest showed no new findings, everything is stable. Patient verbalized understanding.

## 2013-04-02 ENCOUNTER — Ambulatory Visit (HOSPITAL_BASED_OUTPATIENT_CLINIC_OR_DEPARTMENT_OTHER): Payer: Medicare Other | Admitting: Adult Health

## 2013-04-02 ENCOUNTER — Other Ambulatory Visit (HOSPITAL_BASED_OUTPATIENT_CLINIC_OR_DEPARTMENT_OTHER): Payer: Medicare Other

## 2013-04-02 ENCOUNTER — Encounter: Payer: Self-pay | Admitting: Adult Health

## 2013-04-02 ENCOUNTER — Ambulatory Visit (HOSPITAL_BASED_OUTPATIENT_CLINIC_OR_DEPARTMENT_OTHER): Payer: Medicare Other

## 2013-04-02 ENCOUNTER — Telehealth: Payer: Self-pay | Admitting: *Deleted

## 2013-04-02 ENCOUNTER — Encounter: Payer: Self-pay | Admitting: Oncology

## 2013-04-02 VITALS — BP 147/80 | HR 76 | Temp 98.0°F | Resp 18 | Ht 67.0 in | Wt 157.3 lb

## 2013-04-02 VITALS — BP 134/76 | HR 98

## 2013-04-02 DIAGNOSIS — F341 Dysthymic disorder: Secondary | ICD-10-CM

## 2013-04-02 DIAGNOSIS — C801 Malignant (primary) neoplasm, unspecified: Secondary | ICD-10-CM

## 2013-04-02 DIAGNOSIS — Z5112 Encounter for antineoplastic immunotherapy: Secondary | ICD-10-CM

## 2013-04-02 DIAGNOSIS — C569 Malignant neoplasm of unspecified ovary: Secondary | ICD-10-CM

## 2013-04-02 DIAGNOSIS — C786 Secondary malignant neoplasm of retroperitoneum and peritoneum: Secondary | ICD-10-CM

## 2013-04-02 DIAGNOSIS — D0590 Unspecified type of carcinoma in situ of unspecified breast: Secondary | ICD-10-CM

## 2013-04-02 DIAGNOSIS — R110 Nausea: Secondary | ICD-10-CM

## 2013-04-02 DIAGNOSIS — T451X5A Adverse effect of antineoplastic and immunosuppressive drugs, initial encounter: Secondary | ICD-10-CM

## 2013-04-02 DIAGNOSIS — D630 Anemia in neoplastic disease: Secondary | ICD-10-CM

## 2013-04-02 DIAGNOSIS — D6481 Anemia due to antineoplastic chemotherapy: Secondary | ICD-10-CM

## 2013-04-02 LAB — COMPREHENSIVE METABOLIC PANEL (CC13)
ALT: 34 U/L (ref 0–55)
AST: 38 U/L — ABNORMAL HIGH (ref 5–34)
Albumin: 3.6 g/dL (ref 3.5–5.0)
Alkaline Phosphatase: 91 U/L (ref 40–150)
Anion Gap: 12 mEq/L — ABNORMAL HIGH (ref 3–11)
BILIRUBIN TOTAL: 0.4 mg/dL (ref 0.20–1.20)
BUN: 10.7 mg/dL (ref 7.0–26.0)
CALCIUM: 9.1 mg/dL (ref 8.4–10.4)
CO2: 20 meq/L — AB (ref 22–29)
CREATININE: 0.7 mg/dL (ref 0.6–1.1)
Chloride: 110 mEq/L — ABNORMAL HIGH (ref 98–109)
GLUCOSE: 87 mg/dL (ref 70–140)
Potassium: 4 mEq/L (ref 3.5–5.1)
SODIUM: 142 meq/L (ref 136–145)
TOTAL PROTEIN: 6.6 g/dL (ref 6.4–8.3)

## 2013-04-02 LAB — CBC WITH DIFFERENTIAL/PLATELET
BASO%: 2.3 % — ABNORMAL HIGH (ref 0.0–2.0)
BASOS ABS: 0.1 10*3/uL (ref 0.0–0.1)
EOS ABS: 0 10*3/uL (ref 0.0–0.5)
EOS%: 1.5 % (ref 0.0–7.0)
HEMATOCRIT: 37.3 % (ref 34.8–46.6)
HEMOGLOBIN: 11.9 g/dL (ref 11.6–15.9)
LYMPH%: 35.8 % (ref 14.0–49.7)
MCH: 30.7 pg (ref 25.1–34.0)
MCHC: 31.9 g/dL (ref 31.5–36.0)
MCV: 96.4 fL (ref 79.5–101.0)
MONO#: 0.3 10*3/uL (ref 0.1–0.9)
MONO%: 12.1 % (ref 0.0–14.0)
NEUT%: 48.3 % (ref 38.4–76.8)
NEUTROS ABS: 1.3 10*3/uL — AB (ref 1.5–6.5)
PLATELETS: 89 10*3/uL — AB (ref 145–400)
RBC: 3.87 10*6/uL (ref 3.70–5.45)
RDW: 19.6 % — ABNORMAL HIGH (ref 11.2–14.5)
WBC: 2.7 10*3/uL — ABNORMAL LOW (ref 3.9–10.3)
lymph#: 1 10*3/uL (ref 0.9–3.3)
nRBC: 4 % — ABNORMAL HIGH (ref 0–0)

## 2013-04-02 LAB — TECHNOLOGIST REVIEW

## 2013-04-02 LAB — UA PROTEIN, DIPSTICK - CHCC: Protein, ur: NEGATIVE mg/dL

## 2013-04-02 MED ORDER — SODIUM CHLORIDE 0.9 % IJ SOLN
10.0000 mL | INTRAMUSCULAR | Status: DC | PRN
  Administered 2013-04-02: 10 mL
  Filled 2013-04-02: qty 10

## 2013-04-02 MED ORDER — SODIUM CHLORIDE 0.9 % IV SOLN
Freq: Once | INTRAVENOUS | Status: AC
  Administered 2013-04-02: 13:00:00 via INTRAVENOUS

## 2013-04-02 MED ORDER — SODIUM CHLORIDE 0.9 % IV SOLN
15.0000 mg/kg | Freq: Once | INTRAVENOUS | Status: AC
  Administered 2013-04-02: 1025 mg via INTRAVENOUS
  Filled 2013-04-02: qty 41

## 2013-04-02 MED ORDER — HEPARIN SOD (PORK) LOCK FLUSH 100 UNIT/ML IV SOLN
500.0000 [IU] | Freq: Once | INTRAVENOUS | Status: AC | PRN
  Administered 2013-04-02: 500 [IU]
  Filled 2013-04-02: qty 5

## 2013-04-02 NOTE — Progress Notes (Signed)
[name] [name]  [phone] [phone]  OFFICE PROGRESS NOTE   PCP: Aris Georgia, M.D. SU: Rolm Bookbinder, M.D. RAD ONC: Gery Pray, M.D.   DIAGNOSIS: [name] [name] is a 68 year-old female with a history of ductal carcinoma in situ of the left breast diagnosed 04/2009 and ovarian cancer diagnosed in 12/2011.  PRIOR THERAPY: #1. 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 Patient had 3 sentinel biopsied all of them were negative for metastatic disease. Patient underwent radiation therapy between 06/05/2009 through 07/02/2009. She was then begun on Tamoxifen 20 mg daily in 09/2009.  #2  In 12/2011 she received the diagnosis of gynecologic malignancy presenting with abdominal mass and pleural effusions. Patient was originally hospitalized with shortness of breath in 12/2011.  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 She also had malignant ascites.  She underwent bilateral thoracentesis and paracentesis procedures during her hospitalization. During her hospitalization she was seen by gynecologic oncology.  #3 Patient began neoadjuvant chemotherapy consisting of Taxol and Carboplatinum. Her first cycle was administered during her hospitalization. She has completed a total of 6 cycles of Taxol/Carboplatinum from 01/07/2012 - 06/23/2012. Overall she tolerated it well except for some fatigue and neuropathies.  #4 Patient is status post laparotomy in 05/2012 that revealed significant residual disease.   #5  Patient began adjuvant therapy with Topotecan/Avastin starting on 07/24/2012.  CURRENT THERAPY: Cycle 9 day 21 of Topotecan ( day 1, 8, and 15, every 28 days, and Avastin every 21 days)  INTERVAL HISTORY:  [name] [name] 68 y.o. female returns today for evaluation prior to receiving Avastin therapy.   She is not feeling well. She has not received any further steroids with treatment due to her cataracts and has felt increasingly fatigued and slightly nauseated.  She is taking Lorazepam daily for the nausea and it works well.  She denies any fevers, chills, vomiting, constipation, pain, easy bruising/bleeding, swelling, increased shortness of breath, cough, or any further concerns.    MEDICAL HISTORY: Past Medical History  Diagnosis Date  . Interstitial cystitis     on chronic antibiotics  . Hyperlipidemia   . Frequent UTI     on prophylaxis  . 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 CAD (coronary artery disease)     a. s/p CABG 7/13;   b.  LHC 12/01/11:  pLAD 70%, mLAD 40%, CFX 40-50% prior to takeoff of the OM2, oRCA occluded, mid vessel filled via R->R collaterals and distal vessel filled by L->R collaterals, S-OM1/OM2 (small and diffusely dz) with mid 90% stenosis, 90% at OM1 anastomotic site, continuation of OM2 occluded, S-Dx patent, S-PDA occluded, L-LAD ok, EF 55-65%  => Med Rx rec.  [name] Hx of echocardiogram     Echo 5/13: EF [id number], grade 2 diastolic dysfunction  . Hypercholesterolemia   . Pleural effusion 12/28/11    s/p pleurx catheter  . GERD (gastroesophageal reflux disease)   . Arthritis     "just a little; lower back" (12/28/11)  . Gout attack 08/2011    related to "stress post OHS"  . Depression   . Breast cancer     "left"; on Tamoxifen  . S/P thoracentesis 12/28/11    "for pleural effusion" (12/28/2011)  . Ovarian cancer   . Tachycardia   . Neuromuscular disorder     peripheral neuropathy  . Ovarian cancer 02/28/2013  ALLERGIES:  is allergic to codeine; isosorbide; other; phenothiazines; talwin; yellow jacket venom; ambien; and tegaderm ag mesh.  MEDICATIONS:  Current Outpatient Prescriptions  Medication Sig Dispense Refill  . ALPRAZolam (XANAX) 0.25 MG tablet Take 1 tablet (0.25 mg total) by mouth every 8 (eight) hours as needed for anxiety.  30 tablet  4  . Alum &  Mag Hydroxide-Simeth (MAGIC MOUTHWASH) SOLN Take 5 mLs by mouth 4 (four) times daily. Take 5 ml by mouth four times daily as needed.  240 mL  2  . aspirin 325 MG tablet Take 325 mg by mouth at bedtime.       [name] atorvastatin (LIPITOR) 20 MG tablet TAKE ONE TABLET BY MOUTH ONCE DAILY  90 tablet  3  . bisoprolol (ZEBETA) 10 MG tablet Take 1 tablet (10 mg total) by mouth daily.  30 tablet  6  . dexamethasone (DECADRON) 4 MG tablet Take 2 tablets (8 mg total) by mouth 2 (two) times daily with a meal. Take daily starting the day after chemotherapy for 2 days. Take with food.  30 tablet  1  . docusate sodium (COLACE) 100 MG capsule Take 200 mg by mouth daily.      [name] EPINEPHrine (EPIPEN 2-PAK) 0.3 mg/0.3 mL DEVI Inject 0.3 mg into the muscle daily as needed (allergic reaction).       . famotidine (PEPCID) 20 MG tablet Take 20 mg by mouth. Take 1 tablet at bedtime by mouth      . fluticasone (FLONASE) 50 MCG/ACT nasal spray Place 2 sprays into the nose daily as needed for allergies.      [name] gabapentin (NEURONTIN) 100 MG capsule Take 100 mg orally three times a day  90 capsule  6  . lidocaine-prilocaine (EMLA) cream Apply topically as needed.  30 g  1  . loratadine (CLARITIN) 10 MG tablet Take 10 mg by mouth. Take 1 tablet daily as needed by mouth      . nitroGLYCERIN (NITROSTAT) 0.4 MG SL tablet Place 0.4 mg under the tongue every 5 (five) minutes as needed. Chest pain      . omeprazole (PRILOSEC) 20 MG capsule Take 40 mg by mouth daily.      . ondansetron (ZOFRAN) 8 MG tablet Take 1 tablet (8 mg total) by mouth 2 (two) times daily. Take two times a day starting the day after chemo for 2 days. Then take two times a day as needed for nausea or vomiting.  30 tablet  1  . oxybutynin (DITROPAN-XL) 10 MG 24 hr tablet Take 10 mg by mouth daily.      Vladimir Faster Glycol-Propyl Glycol 0.4-0.3 % SOLN Place 1 drop into both eyes every morning. As needed      . prochlorperazine (COMPAZINE) 25 MG suppository Place 1  suppository (25 mg total) rectally every 12 (twelve) hours as needed for nausea.  12 suppository  3  . senna (SENOKOT) 8.6 MG tablet Take 1 tablet by mouth daily as needed for constipation.      . SF 5000 PLUS 1.1 % CREA dental cream Place 1.1 % onto teeth at bedtime.       [name] trimethoprim (TRIMPEX) 100 MG tablet Take 100 mg by mouth every morning.       . valACYclovir (VALTREX) 500 MG tablet Take 1 tablet (500 mg total) by mouth daily.  30 tablet  7  . venlafaxine XR (EFFEXOR-XR) 75 MG 24 hr capsule Take 75 mg  Orally daily  30 capsule  8   No current facility-administered medications for this visit.   Facility-Administered Medications Ordered in Other Visits  Medication Dose Route Frequency Provider Last Rate Last Dose  . influenza  inactive virus vaccine (FLUZONE/FLUARIX) injection 0.5 mL  0.5 mL Intramuscular Once Rita Ohara, MD      . sodium chloride 0.9 % injection 10 mL  10 mL Intracatheter PRN Deatra Robinson, MD   10 mL at 02/28/13 1604    SURGICAL HISTORY:  Past Surgical History  Procedure Laterality Date  . Breast lumpectomy  04/2009    left  . Cesarean section  1973; 1976  . Muscle release  1960    L neck and chest.; "when I was 13; pneumonia settled in my left neck"  . Coronary artery bypass graft  08/18/2011    Procedure: CORONARY ARTERY BYPASS GRAFTING (CABG);  Surgeon: Gaye Pollack, MD;  Location: [place];  Service: Open Heart Surgery;  Laterality: N/A;  Coronary Artery Bypass Graft times five utilizing the left internal mammary artery and the left greater saphenous vein harvested endoscopically.  . Abdominal hysterectomy  1976  . Appendectomy  1976  . Portacath placement  01/06/2012    Procedure: INSERTION PORT-A-CATH;  Surgeon: Gaye Pollack, MD;  Location: [place];  Service: Thoracic;  Laterality: Left;  . Chest tube insertion  01/06/2012    Procedure: INSERTION PLEURAL DRAINAGE CATHETER;  Surgeon: Gaye Pollack, MD;  Location: [facility];  Service: Thoracic;  Laterality:  Bilateral;  . Removal of pleural drainage catheter Right 04/12/2012    Procedure: MINOR REMOVAL OF PLEURAL DRAINAGE CATHETER;  Surgeon: Gaye Pollack, MD;  Location: [place];  Service: Thoracic;  Laterality: Right;  . Talc pleurodesis Left 04/12/2012    Procedure: Pietro Cassis;  Surgeon: Gaye Pollack, MD;  Location: [place];  Service: Thoracic;  Laterality: Left;  . Portacath placement Left 04/17/2012    Procedure: INSERTION PORT-A-CATH;  Surgeon: Gaye Pollack, MD;  Location: [facility];  Service: Thoracic;  Laterality: Left;  . Removal of pleural drainage catheter Left 04/17/2012    Procedure: REMOVAL OF PLEURAL DRAINAGE CATHETER;  Surgeon: Gaye Pollack, MD;  Location: [place];  Service: Thoracic;  Laterality: Left;  . Port-a-cath removal Left 04/17/2012    Procedure: REMOVAL PORT-A-CATH;  Surgeon: Gaye Pollack, MD;  Location: [facility];  Service: Thoracic;  Laterality: Left;  . Cardiac catheterization    . Laparotomy Bilateral 05/30/2012    Procedure: Resection of umbilical mass, Partial omentectomy;  Surgeon: Alvino Chapel, MD;  Location: [facility];  Service: Gynecology;  Laterality: Bilateral;   REVIEW OF SYSTEMS:   A 10 point review of systems was completed and is negative except as noted above.  PHYSICAL EXAMINATION:  BP 147/80  Pulse 76  Temp(Src) 98 F (36.7 C) (Oral)  Resp 18  Ht 5\' 7"  (1.702 m)  Wt 157 lb 4.8 oz (71.351 kg)  BMI 24.63 kg/m2  GENERAL: Patient is a well appearing female in no acute distress HEENT:  Sclerae anicteric.  Oropharynx clear and moist. No ulcerations or evidence of oropharyngeal candidiasis. Neck is supple.  NODES:  No cervical, supraclavicular, or axillary lymphadenopathy palpated.  BREAST EXAM:  deferred LUNGS:  Clear to auscultation bilaterally.  No wheezes or rhonchi. HEART:  Regular rate and rhythm. No murmur appreciated. ABDOMEN:  Soft, nontender.  Positive, normoactive bowel sounds. No organomegaly palpated. MSK:  No focal spinal tenderness to  palpation. Full range of motion bilaterally in the upper  extremities. EXTREMITIES:  No peripheral edema.   SKIN:  Clear with no obvious rashes or skin changes. No nail dyscrasia. NEURO:  Nonfocal. Well oriented.  Appropriate affect. ECOG PERFORMANCE STATUS: 1 - Symptomatic but completely ambulatory.   LABORATORY DATA: Lab Results  Component Value Date   WBC 2.7* 04/02/2013   HGB 11.9 04/02/2013   HCT 37.3 04/02/2013   MCV 96.4 04/02/2013   PLT 89* 04/02/2013      Chemistry      Component Value Date/Time   NA 143 03/26/2013 0920   NA 134* 06/02/2012 0515   NA 141 12/17/2011 1144   K 4.2 03/26/2013 0920   K 4.4 06/02/2012 0515   CL 103 08/07/2012 1108   CL 100 06/02/2012 0515   CO2 23 03/26/2013 0920   CO2 25 06/02/2012 0515   BUN 10.4 03/26/2013 0920   BUN 20 06/02/2012 0515   BUN 15 12/17/2011 1144   CREATININE 0.7 03/26/2013 0920   CREATININE 0.76 06/02/2012 0515      Component Value Date/Time   CALCIUM 9.8 03/26/2013 0920   CALCIUM 8.7 06/02/2012 0515   ALKPHOS 92 03/26/2013 0920   ALKPHOS 85 05/26/2012 0955   AST 29 03/26/2013 0920   AST 22 05/26/2012 0955   ALT 32 03/26/2013 0920   ALT 16 05/26/2012 0955   BILITOT 0.49 03/26/2013 0920   BILITOT 0.3 05/26/2012 0955       RADIOGRAPHIC STUDIES: Dg Chest 2 View 08/30/2012   *RADIOLOGY REPORT*  Clinical Data: Shortness of breath, history metastatic ovarian carcinoma and history of bilateral pleural effusions.  CHEST - 2 VIEW  Comparison: 05/26/2012  Findings: There is significant diminishment in bilateral pleural effusions since the prior study with only trace amount of pleural fluid present bilaterally.  The lungs show no evidence of focal consolidation, masses or pulmonary edema.  Port-A-Cath positioning stable in the SVC.  The heart size is normal.  No bony lesions are seen.  IMPRESSION: Significant diminishment in bilateral pleural effusions since the prior chest x-ray. Trace bilateral pleural effusions remain.  No acute findings.   Original Report  Authenticated By: Aletta Edouard, M.D.     ASSESSMENT/PLAN: 68 year old woman:   #1 History of DCIS of the left breast status post lumpectomy in 04/2009.  Status post radiation therapy completed in 06/2009.  Was on antiestrogen therapy with Tamoxifen 20 mg by mouth daily that was started in 09/2009.  Tamoxifen has been discontinued due to her recent diagnosis of ovarian cancer.  #2 Gynecologic malignancy (ovarian carcinoma): diagnosed in 12/2011. Patient received 6 cycles of neoadjuvant chemotherapy consisting of Taxol/Carboplatinum completed in 06/2012. She then went on to have stored for a laparotomy on 05/30/2012. She was found to have intraoperatively significant residual disease. 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 She had chemosensitivity testing performed these results were inconclusive. She therefore was begun on topotecan and Avastin. Thus far she has been tolerating it very well. She is receiving topotecan day 1 day 8 and 15 on a 28 day cycle with Avastin given every 3 weeks.  She will proceed with Avastin today.  Her fatigue is likely due to discontinuing steroids.  It is improving, therefore we will monitor it closely.    #3 She is status post laparotomy performed on 05/30/2012. Unfortunately, intraoperatively she was found to have significant residual disease. Omental biopsy and BX/resection was performed and she indeed did have high-grade carcinoma consistent with serous  ovarian carcinoma.   #4 Depression/anxiety: Currently on Effexor with good response  #7 Shortness of breath: patient has been seen by cardilogist Dr. Acie Fredrickson and cardiac etiology was ruled out. Patient followed by Dr. Melvyn Novas, last appt on 11/23/12 and he recommended a re-conditioning and exercise plan along with dietary modifications, she then followed up with cardiology in December, 2014, and changes were made to her cardiac medications  which helped.   #8 Neuropathy - currently on Gabapentin 100 mg by mouth 3 times a day.  Neuropathy is currently resolved with this.    #9 anemia: Secondary to chemotherapy and underlying disease, she received 2 units of PRBCs last week and tolerated them fairly well.    #10 history of pleural effusion: CXR showed small bilateral pleural effusions.    #11 patient will be seen back in one week for labs, evaluation, and cycle 10 day 1 of Topotecan.    All questions were answered.  Patient was encouraged to contact us with any questions, problems or concerns.   I spent 25 minutes counseling the patient face to face.  The total time spent in the appointment was 30 minutes.  Minette Headland, [place] [phone]   04/02/2013, 11:39 AM

## 2013-04-02 NOTE — Patient Instructions (Signed)
[facility] Discharge Instructions for Patients Receiving Chemotherapy  Today you received the following chemotherapy agent: Avastin   To help prevent nausea and vomiting after your treatment, we encourage you to take your nausea medication as prescribed.    If you develop nausea and vomiting that is not controlled by your nausea medication, call the clinic.   BELOW ARE SYMPTOMS THAT SHOULD BE REPORTED IMMEDIATELY:  *FEVER GREATER THAN 100.5 F  *CHILLS WITH OR WITHOUT FEVER  NAUSEA AND VOMITING THAT IS NOT CONTROLLED WITH YOUR NAUSEA MEDICATION  *UNUSUAL SHORTNESS OF BREATH  *UNUSUAL BRUISING OR BLEEDING  TENDERNESS IN MOUTH AND THROAT WITH OR WITHOUT PRESENCE OF ULCERS  *URINARY PROBLEMS  *BOWEL PROBLEMS  UNUSUAL RASH Items with * indicate a potential emergency and should be followed up as soon as possible.  Feel free to call the clinic you have any questions or concerns. The clinic phone number is [phone].    

## 2013-04-02 NOTE — Telephone Encounter (Signed)
Due to weather pt r/s for 3/18 at 11:45am. No further concerns, pt verbalized understanding.

## 2013-04-02 NOTE — Progress Notes (Signed)
OK to treat ANC 1.3 and plt 89 per Ria Comment, NP.

## 2013-04-04 ENCOUNTER — Ambulatory Visit: Payer: Medicare Other | Admitting: Gynecologic Oncology

## 2013-04-04 ENCOUNTER — Encounter: Payer: Self-pay | Admitting: *Deleted

## 2013-04-04 ENCOUNTER — Encounter: Payer: Self-pay | Admitting: Oncology

## 2013-04-04 ENCOUNTER — Telehealth: Payer: Self-pay | Admitting: *Deleted

## 2013-04-04 NOTE — Telephone Encounter (Signed)
Per staff message and POF I have scheduled appts.  JMW  

## 2013-04-04 NOTE — Progress Notes (Signed)
Prior authorization request for Omeprazole 20 mg from [place] forwarded to managed care.

## 2013-04-04 NOTE — Progress Notes (Signed)
[employer], [id number], approved omeprazole 20mg  60 tabs from 04/04/13-04/04/14

## 2013-04-09 ENCOUNTER — Ambulatory Visit (HOSPITAL_BASED_OUTPATIENT_CLINIC_OR_DEPARTMENT_OTHER): Payer: Medicare Other

## 2013-04-09 ENCOUNTER — Ambulatory Visit (HOSPITAL_BASED_OUTPATIENT_CLINIC_OR_DEPARTMENT_OTHER): Payer: Medicare Other | Admitting: Adult Health

## 2013-04-09 ENCOUNTER — Other Ambulatory Visit (HOSPITAL_BASED_OUTPATIENT_CLINIC_OR_DEPARTMENT_OTHER): Payer: Medicare Other

## 2013-04-09 ENCOUNTER — Encounter: Payer: Self-pay | Admitting: Adult Health

## 2013-04-09 VITALS — BP 127/80 | HR 85 | Temp 98.1°F | Resp 18 | Ht 67.0 in | Wt 156.6 lb

## 2013-04-09 DIAGNOSIS — C569 Malignant neoplasm of unspecified ovary: Secondary | ICD-10-CM

## 2013-04-09 DIAGNOSIS — F411 Generalized anxiety disorder: Secondary | ICD-10-CM

## 2013-04-09 DIAGNOSIS — F3289 Other specified depressive episodes: Secondary | ICD-10-CM

## 2013-04-09 DIAGNOSIS — F329 Major depressive disorder, single episode, unspecified: Secondary | ICD-10-CM

## 2013-04-09 DIAGNOSIS — C801 Malignant (primary) neoplasm, unspecified: Secondary | ICD-10-CM

## 2013-04-09 DIAGNOSIS — C786 Secondary malignant neoplasm of retroperitoneum and peritoneum: Secondary | ICD-10-CM

## 2013-04-09 DIAGNOSIS — R180 Malignant ascites: Secondary | ICD-10-CM

## 2013-04-09 DIAGNOSIS — D649 Anemia, unspecified: Secondary | ICD-10-CM

## 2013-04-09 DIAGNOSIS — Z5111 Encounter for antineoplastic chemotherapy: Secondary | ICD-10-CM

## 2013-04-09 DIAGNOSIS — D0590 Unspecified type of carcinoma in situ of unspecified breast: Secondary | ICD-10-CM

## 2013-04-09 LAB — CBC WITH DIFFERENTIAL/PLATELET
BASO%: 0.4 % (ref 0.0–2.0)
BASOS ABS: 0 10*3/uL (ref 0.0–0.1)
EOS%: 1.7 % (ref 0.0–7.0)
Eosinophils Absolute: 0.1 10*3/uL (ref 0.0–0.5)
HCT: 38.2 % (ref 34.8–46.6)
HEMOGLOBIN: 11.8 g/dL (ref 11.6–15.9)
LYMPH#: 1.1 10*3/uL (ref 0.9–3.3)
LYMPH%: 15.9 % (ref 14.0–49.7)
MCH: 30.3 pg (ref 25.1–34.0)
MCHC: 30.9 g/dL — ABNORMAL LOW (ref 31.5–36.0)
MCV: 97.9 fL (ref 79.5–101.0)
MONO#: 1.2 10*3/uL — ABNORMAL HIGH (ref 0.1–0.9)
MONO%: 17 % — ABNORMAL HIGH (ref 0.0–14.0)
NEUT#: 4.5 10*3/uL (ref 1.5–6.5)
NEUT%: 65 % (ref 38.4–76.8)
Platelets: 463 10*3/uL — ABNORMAL HIGH (ref 145–400)
RBC: 3.9 10*6/uL (ref 3.70–5.45)
RDW: 21 % — AB (ref 11.2–14.5)
WBC: 6.9 10*3/uL (ref 3.9–10.3)
nRBC: 0 % (ref 0–0)

## 2013-04-09 LAB — COMPREHENSIVE METABOLIC PANEL (CC13)
ALBUMIN: 2.8 g/dL — AB (ref 3.5–5.0)
ALK PHOS: 89 U/L (ref 40–150)
ALT: 23 U/L (ref 0–55)
AST: 26 U/L (ref 5–34)
Anion Gap: 10 mEq/L (ref 3–11)
BUN: 10.9 mg/dL (ref 7.0–26.0)
CO2: 19 mEq/L — ABNORMAL LOW (ref 22–29)
Calcium: 8.2 mg/dL — ABNORMAL LOW (ref 8.4–10.4)
Chloride: 113 mEq/L — ABNORMAL HIGH (ref 98–109)
Creatinine: 0.6 mg/dL (ref 0.6–1.1)
GLUCOSE: 79 mg/dL (ref 70–140)
POTASSIUM: 3.9 meq/L (ref 3.5–5.1)
Sodium: 142 mEq/L (ref 136–145)
Total Bilirubin: 0.38 mg/dL (ref 0.20–1.20)
Total Protein: 5.8 g/dL — ABNORMAL LOW (ref 6.4–8.3)

## 2013-04-09 MED ORDER — SODIUM CHLORIDE 0.9 % IJ SOLN
10.0000 mL | INTRAMUSCULAR | Status: DC | PRN
  Administered 2013-04-09: 10 mL
  Filled 2013-04-09: qty 10

## 2013-04-09 MED ORDER — SODIUM CHLORIDE 0.9 % IV SOLN
Freq: Once | INTRAVENOUS | Status: AC
  Administered 2013-04-09: 11:00:00 via INTRAVENOUS

## 2013-04-09 MED ORDER — HEPARIN SOD (PORK) LOCK FLUSH 100 UNIT/ML IV SOLN
500.0000 [IU] | Freq: Once | INTRAVENOUS | Status: AC | PRN
  Administered 2013-04-09: 500 [IU]
  Filled 2013-04-09: qty 5

## 2013-04-09 MED ORDER — ONDANSETRON 8 MG/50ML IVPB (CHCC)
8.0000 mg | Freq: Once | INTRAVENOUS | Status: AC
  Administered 2013-04-09: 8 mg via INTRAVENOUS

## 2013-04-09 MED ORDER — ONDANSETRON 8 MG/NS 50 ML IVPB
INTRAVENOUS | Status: AC
  Filled 2013-04-09: qty 8

## 2013-04-09 MED ORDER — SODIUM CHLORIDE 0.9 % IV SOLN
3.9500 mg/m2 | Freq: Once | INTRAVENOUS | Status: AC
  Administered 2013-04-09: 7 mg via INTRAVENOUS
  Filled 2013-04-09: qty 7

## 2013-04-09 NOTE — Patient Instructions (Signed)
[facility] Discharge Instructions for Patients Receiving Chemotherapy  Today you received the following chemotherapy agents Topotecan.  To help prevent nausea and vomiting after your treatment, we encourage you to take your nausea medication as prescribed.    If you develop nausea and vomiting that is not controlled by your nausea medication, call the clinic.   BELOW ARE SYMPTOMS THAT SHOULD BE REPORTED IMMEDIATELY:  *FEVER GREATER THAN 100.5 F  *CHILLS WITH OR WITHOUT FEVER  NAUSEA AND VOMITING THAT IS NOT CONTROLLED WITH YOUR NAUSEA MEDICATION  *UNUSUAL SHORTNESS OF BREATH  *UNUSUAL BRUISING OR BLEEDING  TENDERNESS IN MOUTH AND THROAT WITH OR WITHOUT PRESENCE OF ULCERS  *URINARY PROBLEMS  *BOWEL PROBLEMS  UNUSUAL RASH Items with * indicate a potential emergency and should be followed up as soon as possible.  Feel free to call the clinic should you have any questions or concerns. The clinic phone number is [phone].  It was my pleasure to take care of you today! Leeanne Rio, RN

## 2013-04-09 NOTE — Progress Notes (Signed)
[place]  [phone] [phone]  OFFICE PROGRESS NOTE   PCP: [name] [name], M.D. SU: [name] [name], M.D. RAD ONC: [name] [name], M.D.   DIAGNOSIS: [name] [name] is a 68 year-old female with a history of ductal carcinoma in situ of the left breast diagnosed 04/2009 and ovarian cancer diagnosed in 12/2011.  PRIOR THERAPY: #1. 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 Patient had 3 sentinel biopsied all of them were negative for metastatic disease. Patient underwent radiation therapy between 06/05/2009 through 07/02/2009. She was then begun on Tamoxifen 20 mg daily in 09/2009.  #2  In 12/2011 she received the diagnosis of gynecologic malignancy presenting with abdominal mass and pleural effusions. Patient was originally hospitalized with shortness of breath in 12/2011.  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 She also had malignant ascites.  She underwent bilateral thoracentesis and paracentesis procedures during her hospitalization. During her hospitalization she was seen by gynecologic oncology.  #3 Patient began neoadjuvant chemotherapy consisting of Taxol and Carboplatinum. Her first cycle was administered during her hospitalization. She has completed a total of 6 cycles of Taxol/Carboplatinum from 01/07/2012 - 06/23/2012. Overall she tolerated it well except for some fatigue and neuropathies.  #4 Patient is status post laparotomy in 05/2012 that revealed significant residual disease.   #5  Patient began adjuvant therapy with Topotecan/Avastin starting on 07/24/2012.  CURRENT THERAPY: Cycle 10 day 1 of Topotecan ( day 1, 8, and 15, every 28 days, and Avastin every 21 days)  INTERVAL HISTORY:  [name] 68 y.o. female returns today for evaluation prior to receiving cycle 10 of  Topotecan.  She is doing moderately well today.  Her shortness of breath, wheezing, and cough have completely stopped since the Dexamethasone was discontinued.  She continues to have difficulty with her vision and has f/u with her ophthalmologist next week to discuss her cataract surgery.  She is slightly depressed due to her vision and not being able to do the things she once loved such as reading.  Otherwise, she denies fevers, chills, nausea, vomiting, constipation, diarrhea, numbness, mouth pain, or any other concerns.    MEDICAL HISTORY: Past Medical History  Diagnosis Date  . Interstitial cystitis     on chronic antibiotics  . Hyperlipidemia   . Frequent UTI     on prophylaxis  . 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 CAD (coronary artery disease)     a. s/p CABG 7/13;   b.  LHC 12/01/11:  pLAD 70%, mLAD 40%, CFX 40-50% prior to takeoff of the OM2, oRCA occluded, mid vessel filled via R->R collaterals and distal vessel filled by L->R collaterals, S-OM1/OM2 (small and diffusely dz) with mid 90% stenosis, 90% at OM1 anastomotic site, continuation of OM2 occluded, S-Dx patent, S-PDA occluded, L-LAD ok, EF 55-65%  => Med Rx rec.  [name] Hx of echocardiogram     Echo 5/13: EF [id number], grade 2 diastolic dysfunction  . Hypercholesterolemia   . Pleural effusion 12/28/11    s/p pleurx catheter  . GERD (gastroesophageal reflux disease)   . Arthritis     "just a little; lower back" (12/28/11)  . Gout attack 08/2011    related to "stress post OHS"  . Depression   . Breast cancer     "left"; on Tamoxifen  . S/P thoracentesis 12/28/11    "for pleural  effusion" (12/28/2011)  . Ovarian cancer   . Tachycardia   . Neuromuscular disorder     peripheral neuropathy  . Ovarian cancer 02/28/2013    ALLERGIES:  is allergic to codeine; isosorbide; other; phenothiazines; talwin; yellow jacket venom; ambien; and tegaderm ag mesh.  MEDICATIONS:  Current Outpatient Prescriptions  Medication Sig Dispense Refill  .  ALPRAZolam (XANAX) 0.25 MG tablet Take 1 tablet (0.25 mg total) by mouth every 8 (eight) hours as needed for anxiety.  30 tablet  4  . aspirin 325 MG tablet Take 325 mg by mouth at bedtime.       [name] atorvastatin (LIPITOR) 20 MG tablet TAKE ONE TABLET BY MOUTH ONCE DAILY  90 tablet  3  . bisoprolol (ZEBETA) 10 MG tablet Take 1 tablet (10 mg total) by mouth daily.  30 tablet  6  . docusate sodium (COLACE) 100 MG capsule Take 200 mg by mouth daily.      . famotidine (PEPCID) 20 MG tablet Take 20 mg by mouth. Take 1 tablet at bedtime by mouth      . fluticasone (FLONASE) 50 MCG/ACT nasal spray Place 2 sprays into the nose daily as needed for allergies.      [name] gabapentin (NEURONTIN) 100 MG capsule Take 100 mg orally three times a day  90 capsule  6  . lidocaine-prilocaine (EMLA) cream Apply topically as needed.  30 g  1  . loratadine (CLARITIN) 10 MG tablet Take 10 mg by mouth. Take 1 tablet daily as needed by mouth      . LORazepam (ATIVAN) 0.5 MG tablet Take 0.5 mg by mouth every 6 (six) hours as needed for anxiety (take one tab every 6 hours as needed for nausea and vomiting).      [name] omeprazole (PRILOSEC) 20 MG capsule Take 40 mg by mouth daily.      [name] oxybutynin (DITROPAN-XL) 10 MG 24 hr tablet Take 10 mg by mouth daily.      Vladimir Faster Glycol-Propyl Glycol 0.4-0.3 % SOLN Place 1 drop into both eyes every morning. As needed      . senna (SENOKOT) 8.6 MG tablet Take 1 tablet by mouth daily as needed for constipation.      . SF 5000 PLUS 1.1 % CREA dental cream Place 1.1 % onto teeth at bedtime.       [name] trimethoprim (TRIMPEX) 100 MG tablet Take 100 mg by mouth every morning.       . valACYclovir (VALTREX) 500 MG tablet Take 1 tablet (500 mg total) by mouth daily.  30 tablet  7  . venlafaxine XR (EFFEXOR-XR) 75 MG 24 hr capsule Take 75 mg  Orally daily  30 capsule  8  . Alum & Mag Hydroxide-Simeth (MAGIC MOUTHWASH) SOLN Take 5 mLs by mouth 4 (four) times daily. Take 5 ml by mouth four times daily as  needed.  240 mL  2  . dexamethasone (DECADRON) 4 MG tablet Take 2 tablets (8 mg total) by mouth 2 (two) times daily with a meal. Take daily starting the day after chemotherapy for 2 days. Take with food.  30 tablet  1  . EPINEPHrine (EPIPEN 2-PAK) 0.3 mg/0.3 mL DEVI Inject 0.3 mg into the muscle daily as needed (allergic reaction).       . nitroGLYCERIN (NITROSTAT) 0.4 MG SL tablet Place 0.4 mg under the tongue every 5 (five) minutes as needed. Chest pain      . ondansetron (ZOFRAN) 8 MG tablet Take 1 tablet (  8 mg total) by mouth 2 (two) times daily. Take two times a day starting the day after chemo for 2 days. Then take two times a day as needed for nausea or vomiting.  30 tablet  1  . prochlorperazine (COMPAZINE) 25 MG suppository Place 1 suppository (25 mg total) rectally every 12 (twelve) hours as needed for nausea.  12 suppository  3   No current facility-administered medications for this visit.   Facility-Administered Medications Ordered in Other Visits  Medication Dose Route Frequency Provider Last Rate Last Dose  . influenza  inactive virus vaccine (FLUZONE/FLUARIX) injection 0.5 mL  0.5 mL Intramuscular Once Rita Ohara, MD      . sodium chloride 0.9 % injection 10 mL  10 mL Intracatheter PRN Deatra Robinson, MD   10 mL at 02/28/13 1604    SURGICAL HISTORY:  Past Surgical History  Procedure Laterality Date  . Breast lumpectomy  04/2009    left  . Cesarean section  1973; 1976  . Muscle release  1960    L neck and chest.; "when I was 13; pneumonia settled in my left neck"  . Coronary artery bypass graft  08/18/2011    Procedure: CORONARY ARTERY BYPASS GRAFTING (CABG);  Surgeon: Gaye Pollack, MD;  Location: [facility];  Service: Open Heart Surgery;  Laterality: N/A;  Coronary Artery Bypass Graft times five utilizing the left internal mammary artery and the left greater saphenous vein harvested endoscopically.  . Abdominal hysterectomy  1976  . Appendectomy  1976  . Portacath placement   01/06/2012    Procedure: INSERTION PORT-A-CATH;  Surgeon: Gaye Pollack, MD;  Location: [facility];  Service: Thoracic;  Laterality: Left;  . Chest tube insertion  01/06/2012    Procedure: INSERTION PLEURAL DRAINAGE CATHETER;  Surgeon: Gaye Pollack, MD;  Location: [facility];  Service: Thoracic;  Laterality: Bilateral;  . Removal of pleural drainage catheter Right 04/12/2012    Procedure: MINOR REMOVAL OF PLEURAL DRAINAGE CATHETER;  Surgeon: Gaye Pollack, MD;  Location: [facility];  Service: Thoracic;  Laterality: Right;  . Talc pleurodesis Left 04/12/2012    Procedure: Pietro Cassis;  Surgeon: Gaye Pollack, MD;  Location: [facility];  Service: Thoracic;  Laterality: Left;  . Portacath placement Left 04/17/2012    Procedure: INSERTION PORT-A-CATH;  Surgeon: Gaye Pollack, MD;  Location: [facility] OR;  Service: Thoracic;  Laterality: Left;  . Removal of pleural drainage catheter Left 04/17/2012    Procedure: REMOVAL OF PLEURAL DRAINAGE CATHETER;  Surgeon: Gaye Pollack, MD;  Location: [facility];  Service: Thoracic;  Laterality: Left;  . Port-a-cath removal Left 04/17/2012    Procedure: REMOVAL PORT-A-CATH;  Surgeon: Gaye Pollack, MD;  Location: [facility] OR;  Service: Thoracic;  Laterality: Left;  . Cardiac catheterization    . Laparotomy Bilateral 05/30/2012    Procedure: Resection of umbilical mass, Partial omentectomy;  Surgeon: Alvino Chapel, MD;  Location: [facility];  Service: Gynecology;  Laterality: Bilateral;   REVIEW OF SYSTEMS:   A 10 point review of systems was completed and is negative except as noted above.  PHYSICAL EXAMINATION:  BP 127/80  Pulse 85  Temp(Src) 98.1 F (36.7 C) (Oral)  Resp 18  Ht 5\' 7"  (1.702 m)  Wt 156 lb 9.6 oz (71.033 kg)  BMI 24.52 kg/m2  GENERAL: Patient is a well appearing female in no acute distress HEENT:  Sclerae anicteric.  Oropharynx clear and moist. No ulcerations or evidence of oropharyngeal candidiasis. Neck is  supple.  NODES:  No cervical, supraclavicular, or  axillary lymphadenopathy palpated.  BREAST EXAM:  deferred LUNGS:  Clear to auscultation bilaterally.  No wheezes or rhonchi. HEART:  Regular rate and rhythm. No murmur appreciated. ABDOMEN:  Soft, nontender.  Positive, normoactive bowel sounds. No organomegaly palpated. MSK:  No focal spinal tenderness to palpation. Full range of motion bilaterally in the upper extremities. EXTREMITIES:  No peripheral edema.   SKIN:  Clear with no obvious rashes or skin changes. No nail dyscrasia. NEURO:  Nonfocal. Well oriented.  Appropriate affect. ECOG PERFORMANCE STATUS: 1 - Symptomatic but completely ambulatory.   LABORATORY DATA: Lab Results  Component Value Date   WBC 6.9 04/09/2013   HGB 11.8 04/09/2013   HCT 38.2 04/09/2013   MCV 97.9 04/09/2013   PLT 463* 04/09/2013      Chemistry      Component Value Date/Time   NA 142 04/09/2013 0852   NA 134* 06/02/2012 0515   NA 141 12/17/2011 1144   K 3.9 04/09/2013 0852   K 4.4 06/02/2012 0515   CL 103 08/07/2012 1108   CL 100 06/02/2012 0515   CO2 19* 04/09/2013 0852   CO2 25 06/02/2012 0515   BUN 10.9 04/09/2013 0852   BUN 20 06/02/2012 0515   BUN 15 12/17/2011 1144   CREATININE 0.6 04/09/2013 0852   CREATININE 0.76 06/02/2012 0515      Component Value Date/Time   CALCIUM 8.2* 04/09/2013 0852   CALCIUM 8.7 06/02/2012 0515   ALKPHOS 89 04/09/2013 0852   ALKPHOS 85 05/26/2012 0955   AST 26 04/09/2013 0852   AST 22 05/26/2012 0955   ALT 23 04/09/2013 0852   ALT 16 05/26/2012 0955   BILITOT 0.38 04/09/2013 0852   BILITOT 0.3 05/26/2012 0955       RADIOGRAPHIC STUDIES: Dg Chest 2 View 08/30/2012   *RADIOLOGY REPORT*  Clinical Data: Shortness of breath, history metastatic ovarian carcinoma and history of bilateral pleural effusions.  CHEST - 2 VIEW  Comparison: 05/26/2012  Findings: There is significant diminishment in bilateral pleural effusions since the prior study with only trace amount of pleural fluid present bilaterally.  The lungs show no evidence of  focal consolidation, masses or pulmonary edema.  Port-A-Cath positioning stable in the SVC.  The heart size is normal.  No bony lesions are seen.  IMPRESSION: Significant diminishment in bilateral pleural effusions since the prior chest x-ray. Trace bilateral pleural effusions remain.  No acute findings.   Original Report Authenticated By: Aletta Edouard, M.D.     ASSESSMENT/PLAN: 68 year old woman:   #1 History of DCIS of the left breast status post lumpectomy in 04/2009.  Status post radiation therapy completed in 06/2009.  Was on antiestrogen therapy with Tamoxifen 20 mg by mouth daily that was started in 09/2009.  Tamoxifen was discontinued due to her  diagnosis of ovarian cancer.  #2 Gynecologic malignancy (ovarian carcinoma): diagnosed in 12/2011. Patient received 6 cycles of neoadjuvant chemotherapy consisting of Taxol/Carboplatinum completed in 06/2012. She then went on to have stored for a laparotomy on 05/30/2012. She was found to have intraoperatively significant residual disease. 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 She had chemosensitivity testing performed these results were inconclusive. She therefore was begun on topotecan and Avastin. Thus far she has been tolerating it very well. She is receiving topotecan day 1 day 8 and 15 on a 28 day cycle with Avastin given every 3 weeks.  She will proceed with cycle 10 day 1 today of Topotecan.  She understands that she needs to wait at least 28 days from her last avastin prior to having any invasive procedure.   #3 She is status post laparotomy performed on 05/30/2012. Unfortunately, intraoperatively she was found to have significant residual disease. 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   #4 Depression/anxiety: Currently on Effexor with good response  #7  Shortness of breath: patient has been seen by cardilogist Dr. Acie Fredrickson and cardiac etiology was ruled out. Patient followed by Dr. Melvyn Novas, last appt on 11/23/12 and he recommended a re-conditioning and exercise plan along with dietary modifications, she then followed up with cardiology in December, 2014, and changes were made to her cardiac medications which helped.   #8 Neuropathy - currently on Gabapentin 100 mg by mouth 3 times a day.  Neuropathy is currently resolved with this.    #9 anemia: hemoglobin currently stable.    #10 history of pleural effusion: CXR on 03/29/13 showed small bilateral pleural effusions.    #11 patient will be seen back in one week for labs, evaluation, and cycle 10 day 8 of Topotecan.    All questions were answered.  Patient was encouraged to contact us with any questions, problems or concerns.   I spent 25 minutes counseling the patient face to face.  The total time spent in the appointment was 30 minutes.  Minette Headland, [place] [phone] 04/10/2013, 12:21 PM

## 2013-04-16 ENCOUNTER — Encounter: Payer: Self-pay | Admitting: Oncology

## 2013-04-16 ENCOUNTER — Other Ambulatory Visit (HOSPITAL_BASED_OUTPATIENT_CLINIC_OR_DEPARTMENT_OTHER): Payer: Medicare Other

## 2013-04-16 ENCOUNTER — Ambulatory Visit (HOSPITAL_BASED_OUTPATIENT_CLINIC_OR_DEPARTMENT_OTHER): Payer: Medicare Other | Admitting: Oncology

## 2013-04-16 ENCOUNTER — Ambulatory Visit (HOSPITAL_BASED_OUTPATIENT_CLINIC_OR_DEPARTMENT_OTHER): Payer: Medicare Other

## 2013-04-16 VITALS — BP 151/83 | HR 65 | Temp 97.6°F | Resp 18 | Ht 67.0 in | Wt 154.0 lb

## 2013-04-16 DIAGNOSIS — F411 Generalized anxiety disorder: Secondary | ICD-10-CM

## 2013-04-16 DIAGNOSIS — C569 Malignant neoplasm of unspecified ovary: Secondary | ICD-10-CM

## 2013-04-16 DIAGNOSIS — C786 Secondary malignant neoplasm of retroperitoneum and peritoneum: Secondary | ICD-10-CM

## 2013-04-16 DIAGNOSIS — D649 Anemia, unspecified: Secondary | ICD-10-CM

## 2013-04-16 DIAGNOSIS — G609 Hereditary and idiopathic neuropathy, unspecified: Secondary | ICD-10-CM

## 2013-04-16 DIAGNOSIS — C801 Malignant (primary) neoplasm, unspecified: Secondary | ICD-10-CM

## 2013-04-16 DIAGNOSIS — Z5111 Encounter for antineoplastic chemotherapy: Secondary | ICD-10-CM

## 2013-04-16 DIAGNOSIS — C50919 Malignant neoplasm of unspecified site of unspecified female breast: Secondary | ICD-10-CM

## 2013-04-16 DIAGNOSIS — Z853 Personal history of malignant neoplasm of breast: Secondary | ICD-10-CM

## 2013-04-16 LAB — CBC WITH DIFFERENTIAL/PLATELET
BASO%: 2.2 % — ABNORMAL HIGH (ref 0.0–2.0)
Basophils Absolute: 0.1 10*3/uL (ref 0.0–0.1)
EOS%: 1.8 % (ref 0.0–7.0)
Eosinophils Absolute: 0.1 10*3/uL (ref 0.0–0.5)
HEMATOCRIT: 36.9 % (ref 34.8–46.6)
HGB: 11.6 g/dL (ref 11.6–15.9)
LYMPH%: 20.1 % (ref 14.0–49.7)
MCH: 30.3 pg (ref 25.1–34.0)
MCHC: 31.4 g/dL — AB (ref 31.5–36.0)
MCV: 96.3 fL (ref 79.5–101.0)
MONO#: 0.8 10*3/uL (ref 0.1–0.9)
MONO%: 14.3 % — AB (ref 0.0–14.0)
NEUT#: 3.4 10*3/uL (ref 1.5–6.5)
NEUT%: 61.6 % (ref 38.4–76.8)
NRBC: 3 % — AB (ref 0–0)
PLATELETS: 434 10*3/uL — AB (ref 145–400)
RBC: 3.83 10*6/uL (ref 3.70–5.45)
RDW: 20.1 % — ABNORMAL HIGH (ref 11.2–14.5)
WBC: 5.5 10*3/uL (ref 3.9–10.3)
lymph#: 1.1 10*3/uL (ref 0.9–3.3)

## 2013-04-16 LAB — COMPREHENSIVE METABOLIC PANEL (CC13)
ALT: 32 U/L (ref 0–55)
ANION GAP: 11 meq/L (ref 3–11)
AST: 34 U/L (ref 5–34)
Albumin: 3.7 g/dL (ref 3.5–5.0)
Alkaline Phosphatase: 99 U/L (ref 40–150)
BILIRUBIN TOTAL: 0.39 mg/dL (ref 0.20–1.20)
BUN: 13.6 mg/dL (ref 7.0–26.0)
CALCIUM: 9.9 mg/dL (ref 8.4–10.4)
CO2: 22 mEq/L (ref 22–29)
CREATININE: 0.7 mg/dL (ref 0.6–1.1)
Chloride: 108 mEq/L (ref 98–109)
Glucose: 97 mg/dl (ref 70–140)
Potassium: 3.9 mEq/L (ref 3.5–5.1)
Sodium: 141 mEq/L (ref 136–145)
Total Protein: 7.2 g/dL (ref 6.4–8.3)

## 2013-04-16 LAB — TECHNOLOGIST REVIEW

## 2013-04-16 MED ORDER — SODIUM CHLORIDE 0.9 % IJ SOLN
10.0000 mL | INTRAMUSCULAR | Status: DC | PRN
  Administered 2013-04-16: 10 mL
  Filled 2013-04-16: qty 10

## 2013-04-16 MED ORDER — ONDANSETRON 8 MG/50ML IVPB (CHCC)
8.0000 mg | Freq: Once | INTRAVENOUS | Status: AC
  Administered 2013-04-16: 8 mg via INTRAVENOUS

## 2013-04-16 MED ORDER — HEPARIN SOD (PORK) LOCK FLUSH 100 UNIT/ML IV SOLN
500.0000 [IU] | Freq: Once | INTRAVENOUS | Status: AC | PRN
  Administered 2013-04-16: 500 [IU]
  Filled 2013-04-16: qty 5

## 2013-04-16 MED ORDER — ONDANSETRON 8 MG/NS 50 ML IVPB
INTRAVENOUS | Status: AC
  Filled 2013-04-16: qty 8

## 2013-04-16 MED ORDER — TOPOTECAN HCL CHEMO INJECTION 4 MG
4.0000 mg/m2 | Freq: Once | INTRAVENOUS | Status: AC
  Administered 2013-04-16: 7 mg via INTRAVENOUS
  Filled 2013-04-16: qty 7

## 2013-04-16 MED ORDER — SODIUM CHLORIDE 0.9 % IV SOLN
Freq: Once | INTRAVENOUS | Status: AC
  Administered 2013-04-16: 13:00:00 via INTRAVENOUS

## 2013-04-16 NOTE — Patient Instructions (Signed)
[facility] Discharge Instructions for Patients Receiving Chemotherapy  Today you received the following chemotherapy agents Topotecan.  To help prevent nausea and vomiting after your treatment, we encourage you to take your nausea medication.   If you develop nausea and vomiting that is not controlled by your nausea medication, call the clinic.   BELOW ARE SYMPTOMS THAT SHOULD BE REPORTED IMMEDIATELY:  *FEVER GREATER THAN 100.5 F  *CHILLS WITH OR WITHOUT FEVER  NAUSEA AND VOMITING THAT IS NOT CONTROLLED WITH YOUR NAUSEA MEDICATION  *UNUSUAL SHORTNESS OF BREATH  *UNUSUAL BRUISING OR BLEEDING  TENDERNESS IN MOUTH AND THROAT WITH OR WITHOUT PRESENCE OF ULCERS  *URINARY PROBLEMS  *BOWEL PROBLEMS  UNUSUAL RASH Items with * indicate a potential emergency and should be followed up as soon as possible.  Feel free to call the clinic you have any questions or concerns. The clinic phone number is [phone].    

## 2013-04-16 NOTE — Progress Notes (Signed)
[facility]  [phone] [phone]  OFFICE PROGRESS NOTE   PCP: Aris Georgia, M.D. SU: Rolm Bookbinder, M.D. RAD ONC: Gery Pray, M.D.   DIAGNOSIS: Mrs. Mcbrearty is a 68 year-old female with a history of ductal carcinoma in situ of the left breast diagnosed 04/2009 and ovarian cancer diagnosed in 12/2011.  PRIOR THERAPY: #1. 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 Patient had 3 sentinel biopsied all of them were negative for metastatic disease. Patient underwent radiation therapy between 06/05/2009 through 07/02/2009. She was then begun on Tamoxifen 20 mg daily in 09/2009.  #2  In 12/2011 she received the diagnosis of gynecologic malignancy presenting with abdominal mass and pleural effusions. Patient was originally hospitalized with shortness of breath in 12/2011.  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 She also had malignant ascites.  She underwent bilateral thoracentesis and paracentesis procedures during her hospitalization. During her hospitalization she was seen by gynecologic oncology.  #3 Patient began neoadjuvant chemotherapy consisting of Taxol and Carboplatinum. Her first cycle was administered during her hospitalization. She has completed a total of 6 cycles of Taxol/Carboplatinum from 01/07/2012 - 06/23/2012. Overall she tolerated it well except for some fatigue and neuropathies.  #4 Patient is status post laparotomy in 05/2012 that revealed significant residual disease.   #5  Patient began adjuvant therapy with Topotecan/Avastin starting on 07/24/2012.  CURRENT THERAPY: Cycle 10 day 8 of Topotecan ( day 1, 8, and 15, every 28 days, and Avastin every 21 days)  INTERVAL HISTORY:  Mrs.Kennede M. Dunphy 68 y.o. female returns today for evaluation prior to receiving cycle 10 day 8 of  Topotecan.  She is doing well today.  Her shortness of breath, wheezing, and cough have completely stopped since the Dexamethasone was discontinued.  She continues to have difficulty with her vision and has f/u with her ophthalmologist and is going to have her surgery on 3/25.[name]  She is slightly depressed due to her vision and not being able to do the things she once loved such as reading.  Otherwise, she denies fevers, chills, nausea, vomiting, constipation, diarrhea, numbness, mouth pain, or any other concerns.  Remainder of the 10 point review of systems is negative.  MEDICAL HISTORY: Past Medical History  Diagnosis Date  . Interstitial cystitis     on chronic antibiotics  . Hyperlipidemia   . Frequent UTI     on prophylaxis  . 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 CAD (coronary artery disease)     a. s/p CABG 7/13;   b.  LHC 12/01/11:  pLAD 70%, mLAD 40%, CFX 40-50% prior to takeoff of the OM2, oRCA occluded, mid vessel filled via R->R collaterals and distal vessel filled by L->R collaterals, S-OM1/OM2 (small and diffusely dz) with mid 90% stenosis, 90% at OM1 anastomotic site, continuation of OM2 occluded, S-Dx patent, S-PDA occluded, L-LAD ok, EF 55-65%  => Med Rx rec.  [name] Hx of echocardiogram     Echo 5/13: EF [id number], grade 2 diastolic dysfunction  . Hypercholesterolemia   . Pleural effusion 12/28/11    s/p pleurx catheter  . GERD (gastroesophageal reflux disease)   . Arthritis     "just a little; lower back" (12/28/11)  . Gout attack 08/2011    related to "stress post OHS"  . Depression   . Breast cancer     "left";  on Tamoxifen  . S/P thoracentesis 12/28/11    "for pleural effusion" (12/28/2011)  . Ovarian cancer   . Tachycardia   . Neuromuscular disorder     peripheral neuropathy  . Ovarian cancer 02/28/2013    ALLERGIES:  is allergic to codeine; isosorbide; other; phenothiazines; talwin; yellow jacket venom; ambien; and tegaderm ag mesh.  MEDICATIONS:  Current Outpatient  Prescriptions  Medication Sig Dispense Refill  . ALPRAZolam (XANAX) 0.25 MG tablet Take 1 tablet (0.25 mg total) by mouth every 8 (eight) hours as needed for anxiety.  30 tablet  4  . Alum & Mag Hydroxide-Simeth (MAGIC MOUTHWASH) SOLN Take 5 mLs by mouth 4 (four) times daily. Take 5 ml by mouth four times daily as needed.  240 mL  2  . aspirin 325 MG tablet Take 325 mg by mouth at bedtime.       [name] atorvastatin (LIPITOR) 20 MG tablet TAKE ONE TABLET BY MOUTH ONCE DAILY  90 tablet  3  . bisoprolol (ZEBETA) 10 MG tablet Take 1 tablet (10 mg total) by mouth daily.  30 tablet  6  . docusate sodium (COLACE) 100 MG capsule Take 200 mg by mouth daily.      [name] EPINEPHrine (EPIPEN 2-PAK) 0.3 mg/0.3 mL DEVI Inject 0.3 mg into the muscle daily as needed (allergic reaction).       . famotidine (PEPCID) 20 MG tablet Take 20 mg by mouth. Take 1 tablet at bedtime by mouth      . fluticasone (FLONASE) 50 MCG/ACT nasal spray Place 2 sprays into the nose daily as needed for allergies.      [name] gabapentin (NEURONTIN) 100 MG capsule Take 100 mg orally three times a day  90 capsule  6  . lidocaine-prilocaine (EMLA) cream Apply topically as needed.  30 g  1  . loratadine (CLARITIN) 10 MG tablet Take 10 mg by mouth. Take 1 tablet daily as needed by mouth      . nitroGLYCERIN (NITROSTAT) 0.4 MG SL tablet Place 0.4 mg under the tongue every 5 (five) minutes as needed. Chest pain      . omeprazole (PRILOSEC) 20 MG capsule Take 40 mg by mouth daily.      . ondansetron (ZOFRAN) 8 MG tablet Take 1 tablet (8 mg total) by mouth 2 (two) times daily. Take two times a day starting the day after chemo for 2 days. Then take two times a day as needed for nausea or vomiting.  30 tablet  1  . oxybutynin (DITROPAN-XL) 10 MG 24 hr tablet Take 10 mg by mouth daily.      Vladimir Faster Glycol-Propyl Glycol 0.4-0.3 % SOLN Place 1 drop into both eyes every morning. As needed      . prochlorperazine (COMPAZINE) 25 MG suppository Place 1 suppository  (25 mg total) rectally every 12 (twelve) hours as needed for nausea.  12 suppository  3  . senna (SENOKOT) 8.6 MG tablet Take 1 tablet by mouth daily as needed for constipation.      . SF 5000 PLUS 1.1 % CREA dental cream Place 1.1 % onto teeth at bedtime.       [name] trimethoprim (TRIMPEX) 100 MG tablet Take 100 mg by mouth every morning.       . valACYclovir (VALTREX) 500 MG tablet Take 1 tablet (500 mg total) by mouth daily.  30 tablet  7  . venlafaxine XR (EFFEXOR-XR) 75 MG 24 hr capsule Take 75 mg  Orally daily  30 capsule  8  . LORazepam (ATIVAN) 0.5 MG tablet Take 0.5 mg by mouth every 6 (six) hours as needed for anxiety (take one tab every 6 hours as needed for nausea and vomiting).       No current facility-administered medications for this visit.   Facility-Administered Medications Ordered in Other Visits  Medication Dose Route Frequency Provider Last Rate Last Dose  . influenza  inactive virus vaccine (FLUZONE/FLUARIX) injection 0.5 mL  0.5 mL Intramuscular Once Rita Ohara, MD      . sodium chloride 0.9 % injection 10 mL  10 mL Intracatheter PRN Deatra Robinson, MD   10 mL at 02/28/13 1604    SURGICAL HISTORY:  Past Surgical History  Procedure Laterality Date  . Breast lumpectomy  04/2009    left  . Cesarean section  1973; 1976  . Muscle release  1960    L neck and chest.; "when I was 13; pneumonia settled in my left neck"  . Coronary artery bypass graft  08/18/2011    Procedure: CORONARY ARTERY BYPASS GRAFTING (CABG);  Surgeon: Gaye Pollack, MD;  Location: [place];  Service: Open Heart Surgery;  Laterality: N/A;  Coronary Artery Bypass Graft times five utilizing the left internal mammary artery and the left greater saphenous vein harvested endoscopically.  . Abdominal hysterectomy  1976  . Appendectomy  1976  . Portacath placement  01/06/2012    Procedure: INSERTION PORT-A-CATH;  Surgeon: Gaye Pollack, MD;  Location: [place];  Service: Thoracic;  Laterality: Left;  . Chest tube  insertion  01/06/2012    Procedure: INSERTION PLEURAL DRAINAGE CATHETER;  Surgeon: Gaye Pollack, MD;  Location: [facility];  Service: Thoracic;  Laterality: Bilateral;  . Removal of pleural drainage catheter Right 04/12/2012    Procedure: MINOR REMOVAL OF PLEURAL DRAINAGE CATHETER;  Surgeon: Gaye Pollack, MD;  Location: [place];  Service: Thoracic;  Laterality: Right;  . [place] pleurodesis Left 04/12/2012    Procedure: Pietro Cassis;  Surgeon: Gaye Pollack, MD;  Location: [place] [facility];  Service: Thoracic;  Laterality: Left;  . Portacath placement Left 04/17/2012    Procedure: INSERTION PORT-A-CATH;  Surgeon: Gaye Pollack, MD;  Location: [facility];  Service: Thoracic;  Laterality: Left;  . Removal of pleural drainage catheter Left 04/17/2012    Procedure: REMOVAL OF PLEURAL DRAINAGE CATHETER;  Surgeon: Gaye Pollack, MD;  Location: [place];  Service: Thoracic;  Laterality: Left;  . Port-a-cath removal Left 04/17/2012    Procedure: REMOVAL PORT-A-CATH;  Surgeon: Gaye Pollack, MD;  Location: [facility] OR;  Service: Thoracic;  Laterality: Left;  . Cardiac catheterization    . Laparotomy Bilateral 05/30/2012    Procedure: Resection of umbilical mass, Partial omentectomy;  Surgeon: Alvino Chapel, MD;  Location: [facility];  Service: Gynecology;  Laterality: Bilateral;   REVIEW OF SYSTEMS:   A 10 point review of systems was completed and is negative except as noted above.  PHYSICAL EXAMINATION:  BP 151/83  Pulse 65  Temp(Src) 97.6 F (36.4 C) (Oral)  Resp 18  Ht 5\' 7"  (1.702 m)  Wt 154 lb (69.854 kg)  BMI 24.11 kg/m2  GENERAL: Patient is a well appearing female in no acute distress HEENT:  Sclerae anicteric.  Oropharynx clear and moist. No ulcerations or evidence of oropharyngeal candidiasis. Neck is supple.  NODES:  No cervical, supraclavicular, or axillary lymphadenopathy palpated.  BREAST EXAM:  deferred LUNGS:  Clear to auscultation bilaterally.  No wheezes or rhonchi. HEART:  Regular rate  and rhythm. No  murmur appreciated. ABDOMEN:  Soft, nontender.  Positive, normoactive bowel sounds. No organomegaly palpated. MSK:  No focal spinal tenderness to palpation. Full range of motion bilaterally in the upper extremities. EXTREMITIES:  No peripheral edema.   SKIN:  Clear with no obvious rashes or skin changes. No nail dyscrasia. NEURO:  Nonfocal. Well oriented.  Appropriate affect. ECOG PERFORMANCE STATUS: 1 - Symptomatic but completely ambulatory.   LABORATORY DATA: Lab Results  Component Value Date   WBC 5.5 04/16/2013   HGB 11.6 04/16/2013   HCT 36.9 04/16/2013   MCV 96.3 04/16/2013   PLT 434* 04/16/2013      Chemistry      Component Value Date/Time   NA 142 04/09/2013 0852   NA 134* 06/02/2012 0515   NA 141 12/17/2011 1144   K 3.9 04/09/2013 0852   K 4.4 06/02/2012 0515   CL 103 08/07/2012 1108   CL 100 06/02/2012 0515   CO2 19* 04/09/2013 0852   CO2 25 06/02/2012 0515   BUN 10.9 04/09/2013 0852   BUN 20 06/02/2012 0515   BUN 15 12/17/2011 1144   CREATININE 0.6 04/09/2013 0852   CREATININE 0.76 06/02/2012 0515      Component Value Date/Time   CALCIUM 8.2* 04/09/2013 0852   CALCIUM 8.7 06/02/2012 0515   ALKPHOS 89 04/09/2013 0852   ALKPHOS 85 05/26/2012 0955   AST 26 04/09/2013 0852   AST 22 05/26/2012 0955   ALT 23 04/09/2013 0852   ALT 16 05/26/2012 0955   BILITOT 0.38 04/09/2013 0852   BILITOT 0.3 05/26/2012 0955       RADIOGRAPHIC STUDIES: Dg Chest 2 View 08/30/2012   *RADIOLOGY REPORT*  Clinical Data: Shortness of breath, history metastatic ovarian carcinoma and history of bilateral pleural effusions.  CHEST - 2 VIEW  Comparison: 05/26/2012  Findings: There is significant diminishment in bilateral pleural effusions since the prior study with only trace amount of pleural fluid present bilaterally.  The lungs show no evidence of focal consolidation, masses or pulmonary edema.  Port-A-Cath positioning stable in the SVC.  The heart size is normal.  No bony lesions are seen.  IMPRESSION: Significant  diminishment in bilateral pleural effusions since the prior chest x-ray. Trace bilateral pleural effusions remain.  No acute findings.   Original Report Authenticated By: Aletta Edouard, M.D.     ASSESSMENT/PLAN: 68 year old woman:   #1 History of DCIS of the left breast status post lumpectomy in 04/2009.  Status post radiation therapy completed in 06/2009.  Was on antiestrogen therapy with Tamoxifen 20 mg by mouth daily that was started in 09/2009.  Tamoxifen was discontinued due to her  diagnosis of ovarian cancer.  #2 Gynecologic malignancy (ovarian carcinoma): diagnosed in 12/2011. Patient received 6 cycles of neoadjuvant chemotherapy consisting of Taxol/Carboplatinum completed in 06/2012. She then went on to have stored for a laparotomy on 05/30/2012. She was found to have intraoperatively significant residual disease. 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 She had chemosensitivity testing performed these results were inconclusive. She therefore was begun on topotecan and Avastin. Thus far she has been tolerating it very well. She is receiving topotecan day 1 day 8 and 15 on a 28 day cycle with Avastin given every 3 weeks.   She will proceed with cycle 10 day 8 today of Topotecan.    #3 She is status post laparotomy performed on 05/30/2012. Unfortunately, intraoperatively she was found to  have significant residual disease. 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   #4 Depression/anxiety: Currently on Effexor with good response  #7 Shortness of breath: improved  #8 Neuropathy - currently on Gabapentin 100 mg by mouth 3 times a day.  Neuropathy is currently resolved with this.    #9 anemia: hemoglobin currently stable.    #10 history of pleural effusion: CXR on 03/29/13 showed small bilateral pleural effusions.     #11.Steroid induced cataract: patient will be having cataract extraction on 3/25. She may have this appointment moved up to an earlier date. Patient also tells me that her (Dr. Caroleen Hamman at [facility] surgery) does not feel that avastin needs to be held for more 2 weeks? We will clarify this with him.  #12 patient will be seen back in one week for labs, evaluation, and cycle 10 day 15 of Topotecan.    All questions were answered.  Patient was encouraged to contact us with any questions, problems or concerns.   I spent 25 minutes counseling the patient face to face.  The total time spent in the appointment was 30 minutes.  Marcy Panning, MD Medical/Oncology [facility] [phone] (beeper) [phone] (Office)  04/16/2013, 8:17 PM

## 2013-04-20 ENCOUNTER — Other Ambulatory Visit: Payer: Self-pay | Admitting: Oncology

## 2013-04-20 DIAGNOSIS — C50919 Malignant neoplasm of unspecified site of unspecified female breast: Secondary | ICD-10-CM

## 2013-04-20 DIAGNOSIS — C569 Malignant neoplasm of unspecified ovary: Secondary | ICD-10-CM

## 2013-04-23 ENCOUNTER — Telehealth: Payer: Self-pay | Admitting: Adult Health

## 2013-04-23 ENCOUNTER — Encounter: Payer: Self-pay | Admitting: Adult Health

## 2013-04-23 ENCOUNTER — Ambulatory Visit (HOSPITAL_BASED_OUTPATIENT_CLINIC_OR_DEPARTMENT_OTHER): Payer: Medicare Other | Admitting: Adult Health

## 2013-04-23 ENCOUNTER — Ambulatory Visit (HOSPITAL_BASED_OUTPATIENT_CLINIC_OR_DEPARTMENT_OTHER): Payer: Medicare Other

## 2013-04-23 ENCOUNTER — Other Ambulatory Visit (HOSPITAL_BASED_OUTPATIENT_CLINIC_OR_DEPARTMENT_OTHER): Payer: Medicare Other

## 2013-04-23 ENCOUNTER — Telehealth: Payer: Self-pay | Admitting: *Deleted

## 2013-04-23 VITALS — BP 152/78 | HR 78 | Temp 98.0°F | Resp 18 | Ht 67.0 in | Wt 155.6 lb

## 2013-04-23 DIAGNOSIS — C786 Secondary malignant neoplasm of retroperitoneum and peritoneum: Secondary | ICD-10-CM

## 2013-04-23 DIAGNOSIS — Z87898 Personal history of other specified conditions: Secondary | ICD-10-CM

## 2013-04-23 DIAGNOSIS — G609 Hereditary and idiopathic neuropathy, unspecified: Secondary | ICD-10-CM

## 2013-04-23 DIAGNOSIS — F3289 Other specified depressive episodes: Secondary | ICD-10-CM

## 2013-04-23 DIAGNOSIS — F411 Generalized anxiety disorder: Secondary | ICD-10-CM

## 2013-04-23 DIAGNOSIS — C569 Malignant neoplasm of unspecified ovary: Secondary | ICD-10-CM

## 2013-04-23 DIAGNOSIS — F329 Major depressive disorder, single episode, unspecified: Secondary | ICD-10-CM

## 2013-04-23 DIAGNOSIS — D649 Anemia, unspecified: Secondary | ICD-10-CM

## 2013-04-23 DIAGNOSIS — C801 Malignant (primary) neoplasm, unspecified: Secondary | ICD-10-CM

## 2013-04-23 DIAGNOSIS — N3010 Interstitial cystitis (chronic) without hematuria: Secondary | ICD-10-CM

## 2013-04-23 DIAGNOSIS — Z5111 Encounter for antineoplastic chemotherapy: Secondary | ICD-10-CM

## 2013-04-23 LAB — COMPREHENSIVE METABOLIC PANEL (CC13)
ALT: 40 U/L (ref 0–55)
ANION GAP: 11 meq/L (ref 3–11)
AST: 35 U/L — ABNORMAL HIGH (ref 5–34)
Albumin: 3.6 g/dL (ref 3.5–5.0)
Alkaline Phosphatase: 101 U/L (ref 40–150)
BILIRUBIN TOTAL: 0.4 mg/dL (ref 0.20–1.20)
BUN: 12.6 mg/dL (ref 7.0–26.0)
CO2: 22 meq/L (ref 22–29)
CREATININE: 0.7 mg/dL (ref 0.6–1.1)
Calcium: 9.3 mg/dL (ref 8.4–10.4)
Chloride: 108 mEq/L (ref 98–109)
GLUCOSE: 99 mg/dL (ref 70–140)
Potassium: 4 mEq/L (ref 3.5–5.1)
Sodium: 141 mEq/L (ref 136–145)
Total Protein: 6.8 g/dL (ref 6.4–8.3)

## 2013-04-23 LAB — CBC WITH DIFFERENTIAL/PLATELET
BASO%: 3.1 % — ABNORMAL HIGH (ref 0.0–2.0)
Basophils Absolute: 0.2 10*3/uL — ABNORMAL HIGH (ref 0.0–0.1)
EOS%: 1.2 % (ref 0.0–7.0)
Eosinophils Absolute: 0.1 10*3/uL (ref 0.0–0.5)
HCT: 34.1 % — ABNORMAL LOW (ref 34.8–46.6)
HGB: 10.8 g/dL — ABNORMAL LOW (ref 11.6–15.9)
LYMPH%: 16 % (ref 14.0–49.7)
MCH: 30.5 pg (ref 25.1–34.0)
MCHC: 31.7 g/dL (ref 31.5–36.0)
MCV: 96.3 fL (ref 79.5–101.0)
MONO#: 0.4 10*3/uL (ref 0.1–0.9)
MONO%: 8.2 % (ref 0.0–14.0)
NEUT%: 71.5 % (ref 38.4–76.8)
NEUTROS ABS: 3.7 10*3/uL (ref 1.5–6.5)
NRBC: 3 % — AB (ref 0–0)
PLATELETS: 128 10*3/uL — AB (ref 145–400)
RBC: 3.54 10*6/uL — AB (ref 3.70–5.45)
RDW: 20.3 % — ABNORMAL HIGH (ref 11.2–14.5)
WBC: 5.1 10*3/uL (ref 3.9–10.3)
lymph#: 0.8 10*3/uL — ABNORMAL LOW (ref 0.9–3.3)

## 2013-04-23 LAB — TECHNOLOGIST REVIEW

## 2013-04-23 LAB — UA PROTEIN, DIPSTICK - CHCC: Protein, ur: NEGATIVE mg/dL

## 2013-04-23 MED ORDER — HEPARIN SOD (PORK) LOCK FLUSH 100 UNIT/ML IV SOLN
500.0000 [IU] | Freq: Once | INTRAVENOUS | Status: AC | PRN
  Administered 2013-04-23: 500 [IU]
  Filled 2013-04-23: qty 5

## 2013-04-23 MED ORDER — ONDANSETRON 8 MG/NS 50 ML IVPB
INTRAVENOUS | Status: AC
  Filled 2013-04-23: qty 8

## 2013-04-23 MED ORDER — TOPOTECAN HCL CHEMO INJECTION 4 MG
4.0000 mg/m2 | Freq: Once | INTRAVENOUS | Status: AC
  Administered 2013-04-23: 7 mg via INTRAVENOUS
  Filled 2013-04-23: qty 7

## 2013-04-23 MED ORDER — SODIUM CHLORIDE 0.9 % IJ SOLN
10.0000 mL | INTRAMUSCULAR | Status: DC | PRN
  Administered 2013-04-23: 10 mL
  Filled 2013-04-23: qty 10

## 2013-04-23 MED ORDER — ONDANSETRON 8 MG/50ML IVPB (CHCC)
8.0000 mg | Freq: Once | INTRAVENOUS | Status: AC
  Administered 2013-04-23: 8 mg via INTRAVENOUS

## 2013-04-23 MED ORDER — SODIUM CHLORIDE 0.9 % IV SOLN
Freq: Once | INTRAVENOUS | Status: AC
  Administered 2013-04-23: 11:00:00 via INTRAVENOUS

## 2013-04-23 MED ORDER — ONDANSETRON HCL 8 MG PO TABS: 8.0000 mg | ORAL_TABLET | Freq: Two times a day (BID) | ORAL | Status: AC

## 2013-04-23 NOTE — Progress Notes (Signed)
[place]  [phone] [phone]  OFFICE PROGRESS NOTE   PCP: Aris Georgia, M.D. SU: Rolm Bookbinder, M.D. RAD ONC: Gery Pray, M.D.   DIAGNOSIS: [name] [name] is a 68 year-old female with a history of ductal carcinoma in situ of the left breast diagnosed 04/2009 and ovarian cancer diagnosed in 12/2011.  PRIOR THERAPY: #1. 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 Patient had 3 sentinel biopsied all of them were negative for metastatic disease. Patient underwent radiation therapy between 06/05/2009 through 07/02/2009. She was then begun on Tamoxifen 20 mg daily in 09/2009.  #2  In 12/2011 she received the diagnosis of gynecologic malignancy presenting with abdominal mass and pleural effusions. Patient was originally hospitalized with shortness of breath in 12/2011.  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 She also had malignant ascites.  She underwent bilateral thoracentesis and paracentesis procedures during her hospitalization. During her hospitalization she was seen by gynecologic oncology.  #3 Patient began neoadjuvant chemotherapy consisting of Taxol and Carboplatinum. Her first cycle was administered during her hospitalization. She has completed a total of 6 cycles of Taxol/Carboplatinum from 01/07/2012 - 06/23/2012. Overall she tolerated it well except for some fatigue and neuropathies.  #4 Patient is status post laparotomy in 05/2012 that revealed significant residual disease.   #5  Patient began adjuvant therapy with Topotecan/Avastin starting on 07/24/2012.  CURRENT THERAPY: Cycle 10 day 15 of Topotecan ( day 1, 8, and 15, every 28 days, and Avastin every 21 days)  INTERVAL HISTORY:  [name] [name] 68 y.o. female returns today for evaluation prior to receiving cycle 10 day 15 of  Topotecan.  She is c/o occasional waves of nausea, and takes compazine, which makes her tired.  She wants to know if there is anything else that she can take.  She had a good weekend walking with her husband and granddaughter.  She is still scheduled for cataract removal on 05/09/13.  She denies fevers, chills, vomiting, constipation, diarrhea, numbness, mouth pain, or any other concerns.  Remainder of the 10 point review of systems is negative.  MEDICAL HISTORY: Past Medical History  Diagnosis Date  . Interstitial cystitis     on chronic antibiotics  . Hyperlipidemia   . Frequent UTI     on prophylaxis  . 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 CAD (coronary artery disease)     a. s/p CABG 7/13;   b.  LHC 12/01/11:  pLAD 70%, mLAD 40%, CFX 40-50% prior to takeoff of the OM2, oRCA occluded, mid vessel filled via R->R collaterals and distal vessel filled by L->R collaterals, S-OM1/OM2 (small and diffusely dz) with mid 90% stenosis, 90% at OM1 anastomotic site, continuation of OM2 occluded, S-Dx patent, S-PDA occluded, L-LAD ok, EF 55-65%  => Med Rx rec.  [name] Hx of echocardiogram     Echo 5/13: EF [id number], grade 2 diastolic dysfunction  . Hypercholesterolemia   . Pleural effusion 12/28/11    s/p pleurx catheter  . GERD (gastroesophageal reflux disease)   . Arthritis     "just a little; lower back" (12/28/11)  . Gout attack 08/2011    related to "stress post OHS"  . Depression   . Breast cancer     "left"; on Tamoxifen  . S/P thoracentesis 12/28/11    "for pleural effusion" (12/28/2011)  . Ovarian cancer   .  Tachycardia   . Neuromuscular disorder     peripheral neuropathy  . Ovarian cancer 02/28/2013    ALLERGIES:  is allergic to codeine; isosorbide; other; phenothiazines; talwin; yellow jacket venom; ambien; and tegaderm ag mesh.  MEDICATIONS:  Current Outpatient Prescriptions  Medication Sig Dispense Refill  . ALPRAZolam (XANAX) 0.25 MG tablet Take 1 tablet (0.25 mg total) by mouth every 8  (eight) hours as needed for anxiety.  30 tablet  4  . aspirin 325 MG tablet Take 325 mg by mouth at bedtime.       [name] atorvastatin (LIPITOR) 20 MG tablet TAKE ONE TABLET BY MOUTH ONCE DAILY  90 tablet  3  . bisoprolol (ZEBETA) 10 MG tablet Take 1 tablet (10 mg total) by mouth daily.  30 tablet  6  . docusate sodium (COLACE) 100 MG capsule Take 200 mg by mouth daily.      . famotidine (PEPCID) 20 MG tablet Take 20 mg by mouth. Take 1 tablet at bedtime by mouth      . fluticasone (FLONASE) 50 MCG/ACT nasal spray Place 2 sprays into the nose daily as needed for allergies.      [name] gabapentin (NEURONTIN) 100 MG capsule TAKE ONE CAPSULE THREE TIMES DAILY  90 capsule  4  . lidocaine-prilocaine (EMLA) cream Apply topically as needed.  30 g  1  . loratadine (CLARITIN) 10 MG tablet Take 10 mg by mouth. Take 1 tablet daily as needed by mouth      . LORazepam (ATIVAN) 0.5 MG tablet Take 0.5 mg by mouth every 6 (six) hours as needed for anxiety (take one tab every 6 hours as needed for nausea and vomiting).      [name] omeprazole (PRILOSEC) 20 MG capsule Take 40 mg by mouth daily.      [name] oxybutynin (DITROPAN-XL) 10 MG 24 hr tablet Take 10 mg by mouth daily.      Vladimir Faster Glycol-Propyl Glycol 0.4-0.3 % SOLN Place 1 drop into both eyes every morning. As needed      . prochlorperazine (COMPAZINE) 25 MG suppository Place 1 suppository (25 mg total) rectally every 12 (twelve) hours as needed for nausea.  12 suppository  3  . senna (SENOKOT) 8.6 MG tablet Take 1 tablet by mouth daily as needed for constipation.      . SF 5000 PLUS 1.1 % CREA dental cream Place 1.1 % onto teeth at bedtime.       [name] trimethoprim (TRIMPEX) 100 MG tablet Take 100 mg by mouth every morning.       . valACYclovir (VALTREX) 500 MG tablet Take 1 tablet (500 mg total) by mouth daily.  30 tablet  7  . venlafaxine XR (EFFEXOR-XR) 75 MG 24 hr capsule TAKE ONE CAPSULE EACH DAY  30 capsule  4  . Alum & Mag Hydroxide-Simeth (MAGIC MOUTHWASH) SOLN Take  5 mLs by mouth 4 (four) times daily. Take 5 ml by mouth four times daily as needed.  240 mL  2  . EPINEPHrine (EPIPEN 2-PAK) 0.3 mg/0.3 mL DEVI Inject 0.3 mg into the muscle daily as needed (allergic reaction).       . nitroGLYCERIN (NITROSTAT) 0.4 MG SL tablet Place 0.4 mg under the tongue every 5 (five) minutes as needed. Chest pain      . ondansetron (ZOFRAN) 8 MG tablet Take 1 tablet (8 mg total) by mouth 2 (two) times daily. Take two times a day starting the day after chemo for 2 days. Then take two  times a day as needed for nausea or vomiting.  30 tablet  1   No current facility-administered medications for this visit.   Facility-Administered Medications Ordered in Other Visits  Medication Dose Route Frequency Provider Last Rate Last Dose  . influenza  inactive virus vaccine (FLUZONE/FLUARIX) injection 0.5 mL  0.5 mL Intramuscular Once Rita Ohara, MD      . sodium chloride 0.9 % injection 10 mL  10 mL Intracatheter PRN Deatra Robinson, MD   10 mL at 02/28/13 1604    SURGICAL HISTORY:  Past Surgical History  Procedure Laterality Date  . Breast lumpectomy  04/2009    left  . Cesarean section  1973; 1976  . Muscle release  1960    L neck and chest.; "when I was 13; pneumonia settled in my left neck"  . Coronary artery bypass graft  08/18/2011    Procedure: CORONARY ARTERY BYPASS GRAFTING (CABG);  Surgeon: Gaye Pollack, MD;  Location: [facility];  Service: Open Heart Surgery;  Laterality: N/A;  Coronary Artery Bypass Graft times five utilizing the left internal mammary artery and the left greater saphenous vein harvested endoscopically.  . Abdominal hysterectomy  1976  . Appendectomy  1976  . Portacath placement  01/06/2012    Procedure: INSERTION PORT-A-CATH;  Surgeon: Gaye Pollack, MD;  Location: [facility];  Service: Thoracic;  Laterality: Left;  . Chest tube insertion  01/06/2012    Procedure: INSERTION PLEURAL DRAINAGE CATHETER;  Surgeon: Gaye Pollack, MD;  Location: [facility] OR;  Service:  Thoracic;  Laterality: Bilateral;  . Removal of pleural drainage catheter Right 04/12/2012    Procedure: MINOR REMOVAL OF PLEURAL DRAINAGE CATHETER;  Surgeon: Gaye Pollack, MD;  Location: [facility];  Service: Thoracic;  Laterality: Right;  . Talc pleurodesis Left 04/12/2012    Procedure: Pietro Cassis;  Surgeon: Gaye Pollack, MD;  Location: [facility];  Service: Thoracic;  Laterality: Left;  . Portacath placement Left 04/17/2012    Procedure: INSERTION PORT-A-CATH;  Surgeon: Gaye Pollack, MD;  Location: [facility] OR;  Service: Thoracic;  Laterality: Left;  . Removal of pleural drainage catheter Left 04/17/2012    Procedure: REMOVAL OF PLEURAL DRAINAGE CATHETER;  Surgeon: Gaye Pollack, MD;  Location: [facility];  Service: Thoracic;  Laterality: Left;  . Port-a-cath removal Left 04/17/2012    Procedure: REMOVAL PORT-A-CATH;  Surgeon: Gaye Pollack, MD;  Location: [facility] OR;  Service: Thoracic;  Laterality: Left;  . Cardiac catheterization    . Laparotomy Bilateral 05/30/2012    Procedure: Resection of umbilical mass, Partial omentectomy;  Surgeon: Alvino Chapel, MD;  Location: [facility];  Service: Gynecology;  Laterality: Bilateral;   REVIEW OF SYSTEMS:   A 10 point review of systems was completed and is negative except as noted above.  PHYSICAL EXAMINATION:  BP 152/78  Pulse 78  Temp(Src) 98 F (36.7 C) (Oral)  Resp 18  Ht 5\' 7"  (1.702 m)  Wt 155 lb 9.6 oz (70.58 kg)  BMI 24.36 kg/m2  GENERAL: Patient is a well appearing female in no acute distress HEENT:  Sclerae anicteric.  Oropharynx clear and moist. No ulcerations or evidence of oropharyngeal candidiasis. Neck is supple.  NODES:  No cervical, supraclavicular, or axillary lymphadenopathy palpated.  BREAST EXAM:  deferred LUNGS:  Clear to auscultation bilaterally.  No wheezes or rhonchi. HEART:  Regular rate and rhythm. No murmur appreciated. ABDOMEN:  Soft, nontender.  Positive, normoactive bowel sounds. No organomegaly palpated. MSK:  No focal  spinal tenderness to palpation. Full range of motion bilaterally in the upper extremities. EXTREMITIES:  No peripheral edema.   SKIN:  Clear with no obvious rashes or skin changes. No nail dyscrasia. NEURO:  Nonfocal. Well oriented.  Appropriate affect. ECOG PERFORMANCE STATUS: 1 - Symptomatic but completely ambulatory.   LABORATORY DATA: Lab Results  Component Value Date   WBC 5.1 04/23/2013   HGB 10.8* 04/23/2013   HCT 34.1* 04/23/2013   MCV 96.3 04/23/2013   PLT 128* 04/23/2013      Chemistry      Component Value Date/Time   NA 141 04/23/2013 0929   NA 134* 06/02/2012 0515   NA 141 12/17/2011 1144   K 4.0 04/23/2013 0929   K 4.4 06/02/2012 0515   CL 103 08/07/2012 1108   CL 100 06/02/2012 0515   CO2 22 04/23/2013 0929   CO2 25 06/02/2012 0515   BUN 12.6 04/23/2013 0929   BUN 20 06/02/2012 0515   BUN 15 12/17/2011 1144   CREATININE 0.7 04/23/2013 0929   CREATININE 0.76 06/02/2012 0515      Component Value Date/Time   CALCIUM 9.3 04/23/2013 0929   CALCIUM 8.7 06/02/2012 0515   ALKPHOS 101 04/23/2013 0929   ALKPHOS 85 05/26/2012 0955   AST 35* 04/23/2013 0929   AST 22 05/26/2012 0955   ALT 40 04/23/2013 0929   ALT 16 05/26/2012 0955   BILITOT 0.40 04/23/2013 0929   BILITOT 0.3 05/26/2012 0955       RADIOGRAPHIC STUDIES: Dg Chest 2 View 08/30/2012   *RADIOLOGY REPORT*  Clinical Data: Shortness of breath, history metastatic ovarian carcinoma and history of bilateral pleural effusions.  CHEST - 2 VIEW  Comparison: 05/26/2012  Findings: There is significant diminishment in bilateral pleural effusions since the prior study with only trace amount of pleural fluid present bilaterally.  The lungs show no evidence of focal consolidation, masses or pulmonary edema.  Port-A-Cath positioning stable in the SVC.  The heart size is normal.  No bony lesions are seen.  IMPRESSION: Significant diminishment in bilateral pleural effusions since the prior chest x-ray. Trace bilateral pleural effusions remain.  No acute findings.    Original Report Authenticated By: Aletta Edouard, M.D.     ASSESSMENT/PLAN: 68 year old woman:   #1 History of DCIS of the left breast status post lumpectomy in 04/2009.  Status post radiation therapy completed in 06/2009.  Was on antiestrogen therapy with Tamoxifen 20 mg by mouth daily that was started in 09/2009.  Tamoxifen was discontinued due to her  diagnosis of ovarian cancer.  #2 Gynecologic malignancy (ovarian carcinoma): diagnosed in 12/2011. Patient received 6 cycles of neoadjuvant chemotherapy consisting of Taxol/Carboplatinum completed in 06/2012. She then went on to have stored for a laparotomy on 05/30/2012. She was found to have intraoperatively significant residual disease. 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 She had chemosensitivity testing performed these results were inconclusive. She therefore was begun on topotecan and Avastin. Thus far she has been tolerating it very well. She is receiving topotecan day 1 day 8 and 15 on a 28 day cycle with Avastin given every 3 weeks.   She will proceed with cycle 10 day 15 today of Topotecan.  Her CBC is stable.  I reviewed this with her in detail.  A CMP is pending.  Her Avastin is on hold until after Cataract removal.  I prescribed Ondansetron for her to take in addition to the  compazine for her nausea.    #3 She is status post laparotomy performed on 05/30/2012. Unfortunately, intraoperatively she was found to have significant residual disease. 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   #4 Depression/anxiety: Currently on Effexor with good response  #7 Shortness of breath: improved  #8 Neuropathy - currently on Gabapentin 100 mg by mouth 3 times a day.  Neuropathy is currently resolved with this.    #9 anemia: hemoglobin currently stable.    #10 history of  pleural effusion: CXR on 03/29/13 showed small bilateral pleural effusions.    #11.Steroid induced cataract: patient will be having cataract extraction on 3/25 and 4/1 by Dr. Caroleen Hamman at [facility].   #12 Due to her upcoming cataract removal, Ms. Kohen will return on 3/23 for a lab only, and on 4/6, for labs evaluation, and the resumption of cycle 11 Topotecan therapy.    All questions were answered.  Patient was encouraged to contact us with any questions, problems or concerns.   I spent 25 minutes counseling the patient face to face.  The total time spent in the appointment was 30 minutes.  Minette Headland, [place] [phone] 04/24/2013, 2:20 PM

## 2013-04-23 NOTE — Patient Instructions (Signed)
[facility] Discharge Instructions for Patients Receiving Chemotherapy  Today you received the following chemotherapy agents Topotecan.  To help prevent nausea and vomiting after your treatment, we encourage you to take your nausea medication.   If you develop nausea and vomiting that is not controlled by your nausea medication, call the clinic.   BELOW ARE SYMPTOMS THAT SHOULD BE REPORTED IMMEDIATELY:  *FEVER GREATER THAN 100.5 F  *CHILLS WITH OR WITHOUT FEVER  NAUSEA AND VOMITING THAT IS NOT CONTROLLED WITH YOUR NAUSEA MEDICATION  *UNUSUAL SHORTNESS OF BREATH  *UNUSUAL BRUISING OR BLEEDING  TENDERNESS IN MOUTH AND THROAT WITH OR WITHOUT PRESENCE OF ULCERS  *URINARY PROBLEMS  *BOWEL PROBLEMS  UNUSUAL RASH Items with * indicate a potential emergency and should be followed up as soon as possible.  Feel free to call the clinic you have any questions or concerns. The clinic phone number is [phone].    

## 2013-04-23 NOTE — Telephone Encounter (Signed)
, °

## 2013-04-23 NOTE — Telephone Encounter (Signed)
Per staff message and POF I have scheduled appts.  JMW  

## 2013-04-25 ENCOUNTER — Other Ambulatory Visit: Payer: Medicare Other

## 2013-04-30 ENCOUNTER — Other Ambulatory Visit: Payer: Medicare Other

## 2013-04-30 ENCOUNTER — Ambulatory Visit: Payer: Medicare Other | Admitting: Adult Health

## 2013-05-02 ENCOUNTER — Ambulatory Visit: Payer: Medicare Other

## 2013-05-02 ENCOUNTER — Encounter: Payer: Self-pay | Admitting: Gynecologic Oncology

## 2013-05-02 ENCOUNTER — Ambulatory Visit: Payer: Medicare Other | Attending: Gynecologic Oncology | Admitting: Gynecologic Oncology

## 2013-05-02 DIAGNOSIS — Z9221 Personal history of antineoplastic chemotherapy: Secondary | ICD-10-CM | POA: Insufficient documentation

## 2013-05-02 DIAGNOSIS — E7800 Pure hypercholesterolemia, unspecified: Secondary | ICD-10-CM | POA: Insufficient documentation

## 2013-05-02 DIAGNOSIS — Z951 Presence of aortocoronary bypass graft: Secondary | ICD-10-CM | POA: Insufficient documentation

## 2013-05-02 DIAGNOSIS — Z79899 Other long term (current) drug therapy: Secondary | ICD-10-CM | POA: Insufficient documentation

## 2013-05-02 DIAGNOSIS — K219 Gastro-esophageal reflux disease without esophagitis: Secondary | ICD-10-CM | POA: Insufficient documentation

## 2013-05-02 DIAGNOSIS — R300 Dysuria: Secondary | ICD-10-CM

## 2013-05-02 DIAGNOSIS — I2510 Atherosclerotic heart disease of native coronary artery without angina pectoris: Secondary | ICD-10-CM | POA: Insufficient documentation

## 2013-05-02 DIAGNOSIS — R180 Malignant ascites: Secondary | ICD-10-CM | POA: Insufficient documentation

## 2013-05-02 DIAGNOSIS — C50919 Malignant neoplasm of unspecified site of unspecified female breast: Secondary | ICD-10-CM | POA: Insufficient documentation

## 2013-05-02 DIAGNOSIS — E785 Hyperlipidemia, unspecified: Secondary | ICD-10-CM | POA: Insufficient documentation

## 2013-05-02 DIAGNOSIS — C569 Malignant neoplasm of unspecified ovary: Secondary | ICD-10-CM | POA: Insufficient documentation

## 2013-05-02 LAB — URINALYSIS, MICROSCOPIC - CHCC
BILIRUBIN (URINE): NEGATIVE
GLUCOSE UR CHCC: NEGATIVE mg/dL
Ketones: NEGATIVE mg/dL
NITRITE: NEGATIVE
Protein: 30 mg/dL
Specific Gravity, Urine: 1.03 (ref 1.003–1.035)
Urobilinogen, UR: 0.2 mg/dL (ref 0.2–1)
pH: 6 (ref 4.6–8.0)

## 2013-05-02 NOTE — Progress Notes (Signed)
Consult Note: Gyn-Onc  [name] [name] 68 y.o. female  CC:  Chief Complaint  Patient presents with  . Ovarian Cancer    HPI: Patient is a 67 year old Caucasian female with a past medical history of coronary artery disease status post CABG earlier this year, unfortunately following a few months after CABG 2 of her grafts were occluded, history of breast cancer on chronic tamoxifen therapy who presented to the hospital for evaluation of the above noted complaints. Per patient her shortness of breath and cough had been going on for at least a month if not more. She claimed that her shortness of breath was mostly exertional, and recently it had started to get worse and she could not even walk to the mailbox without stopping multiple times. Unfortunately over the weekend her shortness of breath and cough got significantly worse, she got in touch with her cardiologist today who advised her to come to the emergency room for further evaluation and treatment. In the emergency room she was found to have a large left sided pleural effusion on a chest x-ray. In addition to the shortness of breath she states her several months essentially for the time of her CABG she has had some abdominal discomfort which she has noticed subjective bloating. She felt some of this is related to her bowels. She has noticed progressive early satiety. [name] not really had any significant nausea and vomiting.   She underwent a thoracentesis on December 29, 2011 which revealed malignant cells. The differential considerations for the cytology and the immunophenotype included primary mammary carcinoma in a primary gynecologic carcinoma. In addition, should a paracentesis on November 18 that revealed malignant cells similar to those seen on her this cytology specimens. She was diagnosed stage IV presumably ovarian cancer.  She completed her sixth cycle of chemotherapy with paclitaxel and carboplatin in March of 2014. Her CA 125 has  most recently started to rise and is in the 120s up from 90's.   After extensive counseling she opted for attempt at interval debulking. On May 30, 2012 she underwent exploratory laparotomy resection umbilical tumor partial omentectomy by Dr. Fermin Schwab. Surgical findings included extensive ovarian cancer throughout the peritoneal cavity with multiple implants of the small and large bowel serosa and mesentery. In particular, the omentum was entirely replaced with tumor which was encasing the transverse colon and extending up into the splenic flexure. The right ovary was replaced by solid and cystic 7 cm mass. There was extensive peritoneal carcinomatosis. Pelvic peritoneum and rectosigmoid mesentery. At that time it was deemed that she could not have her tumor resected in a meaningful fashion. Dr. Humphrey Rolls saw the patient on April 24 and discussed the pathology with her.   Her pathology revealed: Diagnosis 1. 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 2. Omentum, resection for tumor - HIGH GRADE CARCINOMA WITH CALCIFICATIONS.  After her suboptimal debulking, she was started on topotecan and avastin and most recently is status post cycle #10 under the care of Dr. Humphrey Rolls. Her last CA 125 January 14 for 68.5. She had a CT scan January 22 that revealed: CT ABDOMEN AND PELVIS FINDINGS  Bulky omental caking with areas of presumed central necrosis reidentified, with dominant left mid abdominal implant measuring 5.8 x 13.0 cm, slightly decreased from 12.6 x 6.2 cm previously. Mesenteric nodularity is reidentified. Previously measured upper mid abdominal omental implant measures 3.0 x 2.0 cm, unchanged. Complex right adnexal cystic mass is slightly smaller in transverse diameter, now 6.5 x 2.7  cm compared to 6.5 x 2.9 cm at the same previous anatomic level. No evidence for bowel obstruction. Nodularity and fluid around the liver are stable. Gallbladder, adrenal glands, spleen, pancreas, and  kidneys are unremarkable. Left extra renal pelvis incidentally noted. Mild atheromatous aortic calcification without aneurysm. The bladder is unremarkable. Uterus presumed surgically absent. Left ovary not visualized. Anterior abdominal wall subcutaneous nodules are stable. Bilateral inguinal lymphadenopathy reidentified with representative left inguinal node measuring 1.5 cm in short axis diameter, unchanged when allowing for differences in technique. No lytic or sclerotic osseous lesion. Stable degree of rightward curvature of the thoracolumbar spine with multilevel disc degenerative change.  IMPRESSION:  Stable AP window region lymph node without other evidence for progression of intrathoracic metastatic disease. Slight decrease in bulky omental caking with otherwise stable mesenteric and pelvic disease burden as described above. Stable inguinal lymphadenopathy.  She comes in today for followup with [place].  Interval History: She is overall doing fairly well. She is currently off her steroids as he felt that she developed steroid-induced cataracts. She scheduled for her left cataract surgery next week and in 2 weeks the right. [name] feeling pretty well now she's been off of chemotherapy this past week. She will resume chemotherapy after her cataract surgery. She did not feel well last Monday Tuesday and Wednesday after her treatment as she did not take her steroids. She similarly did not use her Compazine as it makes her tired. She has no abdominal pain. [name] not sure if she has a urinary tract infection as she feels tingling down her legs and arms for the past few days when she has gone to void. No dysuria. She has been drinking a lot of water. [name] had no change in her bowel habits. Her neuropathy continues to improve with her Neurontin. She does occasionally feels stiff gets up and walks around. She has no pain in her joints. [name] been doing some light gardening. Every Friday she and her husband help  take care of their grandchild.  Review of Systems:  Constitutional:  Denies fever.+ visual changes Skin: No rash, sores, jaundice, itching, or dryness.  Cardiovascular: No chest pain, + shortness of breath, no edema  Pulmonary: No cough or wheeze.  Gastro Intestinal: No abdominal pain  No nausea, vomiting, constipation, or diarrhea reported. No bright red blood per rectum or change in bowel movement.  Genitourinary: No frequency, urgency, or dysuria.  Denies vaginal bleeding and discharge. + tingling in legs and arms with voiding Musculoskeletal: No myalgia, arthralgia, joint swelling or pain.  Neurologic: No weakness, numbness, or change in gait. Neuropathy improving with neurontin Psychology: doing well   Current Meds:  Outpatient Encounter Prescriptions as of 05/02/2013  Medication Sig  . ALPRAZolam (XANAX) 0.25 MG tablet Take 1 tablet (0.25 mg total) by mouth every 8 (eight) hours as needed for anxiety.  . Alum & Mag Hydroxide-Simeth (MAGIC MOUTHWASH) SOLN Take 5 mLs by mouth 4 (four) times daily. Take 5 ml by mouth four times daily as needed.  [name] aspirin 325 MG tablet Take 325 mg by mouth at bedtime.   [name] atorvastatin (LIPITOR) 20 MG tablet TAKE ONE TABLET BY MOUTH ONCE DAILY  . bisoprolol (ZEBETA) 10 MG tablet Take 1 tablet (10 mg total) by mouth daily.  [name] docusate sodium (COLACE) 100 MG capsule Take 200 mg by mouth daily.  [name] EPINEPHrine (EPIPEN 2-PAK) 0.3 mg/0.3 mL DEVI Inject 0.3 mg into the muscle daily as needed (allergic reaction).   . famotidine (PEPCID) 20  MG tablet Take 20 mg by mouth. Take 1 tablet at bedtime by mouth  . fluticasone (FLONASE) 50 MCG/ACT nasal spray Place 2 sprays into the nose daily as needed for allergies.  [name] gabapentin (NEURONTIN) 100 MG capsule TAKE ONE CAPSULE THREE TIMES DAILY  . lidocaine-prilocaine (EMLA) cream Apply topically as needed.  . loratadine (CLARITIN) 10 MG tablet Take 10 mg by mouth. Take 1 tablet daily as needed by mouth  . LORazepam  (ATIVAN) 0.5 MG tablet Take 0.5 mg by mouth every 6 (six) hours as needed for anxiety (take one tab every 6 hours as needed for nausea and vomiting).  . nitroGLYCERIN (NITROSTAT) 0.4 MG SL tablet Place 0.4 mg under the tongue every 5 (five) minutes as needed. Chest pain  . omeprazole (PRILOSEC) 20 MG capsule Take 40 mg by mouth daily.  . ondansetron (ZOFRAN) 8 MG tablet Take 1 tablet (8 mg total) by mouth 2 (two) times daily. Take two times a day starting the day after chemo for 2 days. Then take two times a day as needed for nausea or vomiting.  [name] oxybutynin (DITROPAN-XL) 10 MG 24 hr tablet Take 10 mg by mouth daily.  Vladimir Faster Glycol-Propyl Glycol 0.4-0.3 % SOLN Place 1 drop into both eyes every morning. As needed  . prochlorperazine (COMPAZINE) 25 MG suppository Place 1 suppository (25 mg total) rectally every 12 (twelve) hours as needed for nausea.  [name] senna (SENOKOT) 8.6 MG tablet Take 1 tablet by mouth daily as needed for constipation.  . SF 5000 PLUS 1.1 % CREA dental cream Place 1.1 % onto teeth at bedtime.   [name] trimethoprim (TRIMPEX) 100 MG tablet Take 100 mg by mouth every morning.   . valACYclovir (VALTREX) 500 MG tablet Take 1 tablet (500 mg total) by mouth daily.  [name] venlafaxine XR (EFFEXOR-XR) 75 MG 24 hr capsule TAKE ONE CAPSULE EACH DAY    Allergy:  Allergies  Allergen Reactions  . Codeine Nausea And Vomiting       . Isosorbide Other (See Comments)    Extreme headaches  . Other Swelling    All Lip moisturizers except Vaseline.  . Phenothiazines Other (See Comments)    Makes her stop breathing.  Loma Messing [Pentazocine] Nausea And Vomiting  . Yellow Jacket Venom Anaphylaxis  . Ambien [Zolpidem Tartrate]     Bad dreams  . Decadron [Dexamethasone]     "Caused vision problems"  . Tegaderm Ag Mesh [Silver] Rash    USE ONLY OPSITE TO PAC    Social Hx:   History   Social History  . Marital Status: Married    Spouse Name: N/A    Number of Children: 1  . Years of  Education: N/A   Occupational History  . media Environmental consultant and ESL coordinator [place] History Main Topics  . Smoking status: Former Smoker -- 0.12 packs/day for 10 years    Types: Cigarettes    Quit date: 02/15/2001  . Smokeless tobacco: Never Used  . Alcohol Use: 0.0 oz/week     Comment: 12/28/11 "1 gin & tonic q hs", 2/5/141-2 glass a wine occa  05/26/12 none x 1 year  . Drug Use: No  . Sexual Activity: Not Currently    Birth Control/ Protection: Post-menopausal   Other Topics Concern  . Not on file   Social History Narrative   Lives with husband and dog    Past Surgical Hx:  Past Surgical History  Procedure Laterality Date  .  Breast lumpectomy  04/2009    left  . Cesarean section  1973; 1976  . Muscle release  1960    L neck and chest.; "when I was 13; pneumonia settled in my left neck"  . Coronary artery bypass graft  08/18/2011    Procedure: CORONARY ARTERY BYPASS GRAFTING (CABG);  Surgeon: Gaye Pollack, MD;  Location: [place];  Service: Open Heart Surgery;  Laterality: N/A;  Coronary Artery Bypass Graft times five utilizing the left internal mammary artery and the left greater saphenous vein harvested endoscopically.  . Abdominal hysterectomy  1976  . Appendectomy  1976  . Portacath placement  01/06/2012    Procedure: INSERTION PORT-A-CATH;  Surgeon: Gaye Pollack, MD;  Location: [place];  Service: Thoracic;  Laterality: Left;  . Chest tube insertion  01/06/2012    Procedure: INSERTION PLEURAL DRAINAGE CATHETER;  Surgeon: Gaye Pollack, MD;  Location: [facility];  Service: Thoracic;  Laterality: Bilateral;  . Removal of pleural drainage catheter Right 04/12/2012    Procedure: MINOR REMOVAL OF PLEURAL DRAINAGE CATHETER;  Surgeon: Gaye Pollack, MD;  Location: [place];  Service: Thoracic;  Laterality: Right;  . [place]c pleurodesis Left 04/12/2012    Procedure: Pietro Cassis;  Surgeon: Gaye Pollack, MD;  Location: [place];  Service: Thoracic;  Laterality:  Left;  . Portacath placement Left 04/17/2012    Procedure: INSERTION PORT-A-CATH;  Surgeon: Gaye Pollack, MD;  Location: [facility];  Service: Thoracic;  Laterality: Left;  . Removal of pleural drainage catheter Left 04/17/2012    Procedure: REMOVAL OF PLEURAL DRAINAGE CATHETER;  Surgeon: Gaye Pollack, MD;  Location: [place];  Service: Thoracic;  Laterality: Left;  . Port-a-cath removal Left 04/17/2012    Procedure: REMOVAL PORT-A-CATH;  Surgeon: Gaye Pollack, MD;  Location: [facility];  Service: Thoracic;  Laterality: Left;  . Cardiac catheterization    . Laparotomy Bilateral 05/30/2012    Procedure: Resection of umbilical mass, Partial omentectomy;  Surgeon: Alvino Chapel, MD;  Location: [facility];  Service: Gynecology;  Laterality: Bilateral;    Past Medical Hx:  Past Medical History  Diagnosis Date  . Interstitial cystitis     on chronic antibiotics  . Hyperlipidemia   . Frequent UTI     on prophylaxis  . 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 CAD (coronary artery disease)     a. s/p CABG 7/13;   b.  LHC 12/01/11:  pLAD 70%, mLAD 40%, CFX 40-50% prior to takeoff of the OM2, oRCA occluded, mid vessel filled via R->R collaterals and distal vessel filled by L->R collaterals, S-OM1/OM2 (small and diffusely dz) with mid 90% stenosis, 90% at OM1 anastomotic site, continuation of OM2 occluded, S-Dx patent, S-PDA occluded, L-LAD ok, EF 55-65%  => Med Rx rec.  [name] Hx of echocardiogram     Echo 5/13: EF 16-60%, grade 2 diastolic dysfunction  . Hypercholesterolemia   . Pleural effusion 12/28/11    s/p pleurx catheter  . GERD (gastroesophageal reflux disease)   . Arthritis     "just a little; lower back" (12/28/11)  . Gout attack 08/2011    related to "stress post OHS"  . Depression   . Breast cancer     "left"; on Tamoxifen  . S/P thoracentesis 12/28/11    "for pleural effusion" (12/28/2011)  . Ovarian cancer   . Tachycardia   . Neuromuscular disorder     peripheral neuropathy  . Ovarian cancer  02/28/2013    Family  Hx:  Family History  Problem Relation Age of Onset  . Cancer Mother 59    breast cancer  . Heart disease Father 67    MI at 55, CABG in 30's  . Hepatitis Father     C from blood transfusion  . Heart disease Brother     CABG in 45's  . Heart disease Paternal Aunt   . Heart disease Paternal Uncle   . Heart disease Paternal Grandfather   . Diabetes Neg Hx     Vitals:  There were no vitals taken for this visit.  Physical Exam: Well-nourished well-developed female in no acute distress.  Neck: Supple, no lymphadenopathy no thyromegaly.  Lungs: Clear to auscultation bilaterally  Cardiovascular: Regular rate and rhythm.  Abdomen: Well-healed vertical midline incision. The abdomen is somewhat distended + palpable mass mid abdomen towards the left. There is no distinct fluid wave.   Extremities: Trace edema.  Pelvic: Normal female genitalia. Bimanual examination reveals a 6 cm mobile mass palpable at the top of the vaginal apex. There is no nodularity. Rectal confirms. Better felt on vaginal exam, possibly less mobile.   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 She is malignant ascites consistent with a gynecologic primary. [name] undergone 6 cycles of paclitaxel and carboplatin based chemotherapy.  Interval debulking was not successful in April 2014 and she was suboptimally debulked. She is currently on topotecan and Avastin and is status post 10 cycles with essentially stable to slightly disease on CT scan. She is doing remarkably well and is enjoying a relatively good quality of life.  Her performance status is 0. The plan will be to restart her chemotherapy with topotecan and Avastin after her cataract surgery. I completely agree with this regimen as she appears to tolerate it well and have a positive response. She will return to see [place] when  necessary per Dr. Humphrey Rolls.  Raimi Guillermo A., MD 05/02/2013, 12:07 PM

## 2013-05-02 NOTE — Patient Instructions (Signed)
Good luck with your surgery!

## 2013-05-04 LAB — URINE CULTURE

## 2013-05-07 ENCOUNTER — Other Ambulatory Visit: Payer: Self-pay | Admitting: *Deleted

## 2013-05-07 ENCOUNTER — Ambulatory Visit: Payer: Medicare Other | Admitting: Adult Health

## 2013-05-07 ENCOUNTER — Other Ambulatory Visit (HOSPITAL_BASED_OUTPATIENT_CLINIC_OR_DEPARTMENT_OTHER): Payer: Medicare Other

## 2013-05-07 DIAGNOSIS — C569 Malignant neoplasm of unspecified ovary: Secondary | ICD-10-CM

## 2013-05-07 LAB — CBC WITH DIFFERENTIAL/PLATELET
BASO%: 0.5 % (ref 0.0–2.0)
BASOS ABS: 0 10*3/uL (ref 0.0–0.1)
EOS%: 4.3 % (ref 0.0–7.0)
Eosinophils Absolute: 0.3 10*3/uL (ref 0.0–0.5)
HCT: 33.3 % — ABNORMAL LOW (ref 34.8–46.6)
HEMOGLOBIN: 10.1 g/dL — AB (ref 11.6–15.9)
LYMPH%: 12.3 % — ABNORMAL LOW (ref 14.0–49.7)
MCH: 30.2 pg (ref 25.1–34.0)
MCHC: 30.3 g/dL — AB (ref 31.5–36.0)
MCV: 99.7 fL (ref 79.5–101.0)
MONO#: 0.9 10*3/uL (ref 0.1–0.9)
MONO%: 15.6 % — AB (ref 0.0–14.0)
NEUT#: 3.9 10*3/uL (ref 1.5–6.5)
NEUT%: 67.3 % (ref 38.4–76.8)
Platelets: 437 10*3/uL — ABNORMAL HIGH (ref 145–400)
RBC: 3.34 10*6/uL — ABNORMAL LOW (ref 3.70–5.45)
RDW: 22.9 % — AB (ref 11.2–14.5)
WBC: 5.8 10*3/uL (ref 3.9–10.3)
lymph#: 0.7 10*3/uL — ABNORMAL LOW (ref 0.9–3.3)
nRBC: 2 % — ABNORMAL HIGH (ref 0–0)

## 2013-05-07 LAB — COMPREHENSIVE METABOLIC PANEL (CC13)
ALT: 25 U/L (ref 0–55)
AST: 26 U/L (ref 5–34)
Albumin: 3.5 g/dL (ref 3.5–5.0)
Alkaline Phosphatase: 95 U/L (ref 40–150)
Anion Gap: 12 mEq/L — ABNORMAL HIGH (ref 3–11)
BUN: 12.9 mg/dL (ref 7.0–26.0)
CO2: 21 mEq/L — ABNORMAL LOW (ref 22–29)
Calcium: 9.5 mg/dL (ref 8.4–10.4)
Chloride: 111 mEq/L — ABNORMAL HIGH (ref 98–109)
Creatinine: 0.7 mg/dL (ref 0.6–1.1)
GLUCOSE: 122 mg/dL (ref 70–140)
Potassium: 4 mEq/L (ref 3.5–5.1)
Sodium: 144 mEq/L (ref 136–145)
Total Bilirubin: 0.38 mg/dL (ref 0.20–1.20)
Total Protein: 6.9 g/dL (ref 6.4–8.3)

## 2013-05-07 MED ORDER — CIPROFLOXACIN HCL 250 MG PO TABS: 250.0000 mg | ORAL_TABLET | Freq: Two times a day (BID) | ORAL | Status: DC

## 2013-05-07 NOTE — Telephone Encounter (Signed)
Cipro 250 bid called into pt pharmacy due to Urine Culture results. Pt notified

## 2013-05-17 ENCOUNTER — Encounter: Payer: Self-pay | Admitting: *Deleted

## 2013-05-21 ENCOUNTER — Other Ambulatory Visit: Payer: Self-pay | Admitting: Oncology

## 2013-05-21 ENCOUNTER — Telehealth: Payer: Self-pay

## 2013-05-21 ENCOUNTER — Other Ambulatory Visit: Payer: Self-pay | Admitting: *Deleted

## 2013-05-21 ENCOUNTER — Ambulatory Visit: Payer: Medicare Other

## 2013-05-21 ENCOUNTER — Telehealth: Payer: Self-pay | Admitting: *Deleted

## 2013-05-21 ENCOUNTER — Other Ambulatory Visit: Payer: Medicare Other

## 2013-05-21 ENCOUNTER — Ambulatory Visit: Payer: Medicare Other | Admitting: Adult Health

## 2013-05-21 NOTE — Telephone Encounter (Signed)
Received call from patient that she had left eye surgery on 05/09/13 and Dr. Lucita Ferrara has cleared her for chemotherapy. However, patient stated, "Dr. Humphrey Rolls has not cleared me for starting chemotherapy again, until my eye is healed." She has a follow up appointment with Dr. Lucita Ferrara  on 05/28/13 at 10:00 am. Patient asked Dr. Lucita Ferrara to talk to Dr. Humphrey Rolls about chemotherapy treatments.  Patient understands that Dr. Humphrey Rolls is out of the office this week and will return next week.

## 2013-05-21 NOTE — Telephone Encounter (Signed)
LMOVM - pt due to clinic today.  Pls call let us know ok and to reschedule.

## 2013-05-28 ENCOUNTER — Other Ambulatory Visit: Payer: Medicare Other

## 2013-05-28 ENCOUNTER — Ambulatory Visit: Payer: Medicare Other | Admitting: Adult Health

## 2013-05-28 ENCOUNTER — Ambulatory Visit: Payer: Medicare Other

## 2013-05-28 NOTE — Progress Notes (Signed)
Hematology and Oncology Follow Up Visit  [name] [name] [id number] 04-07-1945 68 y.o. 05/30/2013 8:54 AM     Principle Diagnosis:[name] [name] 68 y.o. 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   Prior Therapy:  #1. 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 Patient had 3 sentinel biopsied all of them were negative for metastatic disease. Patient underwent radiation therapy between 06/05/2009 through 07/02/2009. She was then begun on Tamoxifen 20 mg daily in 09/2009.   #2 In 12/2011 she received the diagnosis of gynecologic malignancy presenting with abdominal mass and pleural effusions. Patient was originally hospitalized with shortness of breath in 12/2011. 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 She also had malignant ascites. She underwent bilateral thoracentesis and paracentesis procedures during her hospitalization. During her hospitalization she was seen by gynecologic oncology.   #3 Patient began neoadjuvant chemotherapy consisting of Taxol and Carboplatinum. Her first cycle was administered during her hospitalization. She has completed a total of 6 cycles of Taxol/Carboplatinum from 01/07/2012 - 06/23/2012. Overall she tolerated it well except for some fatigue and neuropathies.   #4 Patient is status post laparotomy in 05/2012 that revealed significant residual disease.   #5 Patient began adjuvant therapy with Topotecan/Avastin starting on 07/24/2012.   Current therapy:  Topotecan and Avastin cycle 11 day 1.  Topotecan is given on day 1, day 8, and day 15 of a 21 day cycle and Avastin is given every 21 days.    Interim History: [name] [name] 68 y.o. female with ovarian cancer who is here today prior to receiving Topotecan.  She is accompanied by her  husband.  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  This was done on 05/09/13.  This has taken longer to heal than we anticipated.  Dr. Carlis Abbott has cleared her to receive treatment today.  She continues to have difficulties with her vision.  She tells me that she doesn't want to delay chemotherapy any more and she does not plan on having the right eye fixed for some time.      She is doing well today.  She has been doing yard work lately.  Her shortness of breath is stable.  She is taking Gabapentin daily, and her numbness is stable.  It has resolved in her fingertips.  She did have an episode where her right groin and the area behind her left buttock was swollen, painful and made it difficult for her to walk.  This happened once and lasted briefly.  It has since resolved and hasn't recurred.  She is concerned that she has not received the Topotecan and the Avastin in too long of a time and that is why it happened.  Otherwise, she is doing well and a 10 point ROs is neg.   Medications:  Current Outpatient Prescriptions  Medication Sig Dispense Refill  . aspirin 325 MG tablet Take 325 mg by mouth at bedtime.       [name] atorvastatin (LIPITOR) 20 MG tablet TAKE ONE TABLET BY MOUTH ONCE DAILY  90 tablet  3  . bisoprolol (ZEBETA) 10 MG tablet Take 1 tablet (10 mg total) by mouth daily.  30 tablet  6  . docusate sodium (COLACE) 100 MG capsule Take 200 mg by mouth daily.      [name]  famotidine (PEPCID) 20 MG tablet Take 20 mg by mouth. Take 1 tablet at bedtime by mouth      . gabapentin (NEURONTIN) 100 MG capsule TAKE ONE CAPSULE  DAILY      . lidocaine-prilocaine (EMLA) cream Apply topically as needed.  30 g  1  . omeprazole (PRILOSEC) 20 MG capsule Take 40 mg by mouth daily.      [name] oxybutynin (DITROPAN-XL) 10 MG 24 hr tablet Take 10 mg by mouth daily.      [name] senna (SENOKOT) 8.6 MG tablet Take 1 tablet by mouth daily as needed for constipation.      . SF 5000  PLUS 1.1 % CREA dental cream Place 1.1 % onto teeth at bedtime.       [name] trimethoprim (TRIMPEX) 100 MG tablet Take 100 mg by mouth every morning.       . venlafaxine XR (EFFEXOR-XR) 75 MG 24 hr capsule TAKE ONE CAPSULE EACH DAY  30 capsule  4  . ALPRAZolam (XANAX) 0.25 MG tablet Take 1 tablet (0.25 mg total) by mouth every 8 (eight) hours as needed for anxiety.  30 tablet  4  . Alum & Mag Hydroxide-Simeth (MAGIC MOUTHWASH) SOLN Take 5 mLs by mouth 4 (four) times daily. Take 5 ml by mouth four times daily as needed.  240 mL  2  . EPINEPHrine (EPIPEN 2-PAK) 0.3 mg/0.3 mL DEVI Inject 0.3 mg into the muscle daily as needed (allergic reaction).       . fluticasone (FLONASE) 50 MCG/ACT nasal spray Place 2 sprays into the nose daily as needed for allergies.      [name] LORazepam (ATIVAN) 0.5 MG tablet Take 0.5 mg by mouth every 6 (six) hours as needed for anxiety (take one tab every 6 hours as needed for nausea and vomiting).      . nitroGLYCERIN (NITROSTAT) 0.4 MG SL tablet Place 0.4 mg under the tongue every 5 (five) minutes as needed. Chest pain      . ondansetron (ZOFRAN) 8 MG tablet Take 1 tablet (8 mg total) by mouth 2 (two) times daily. Take two times a day starting the day after chemo for 2 days. Then take two times a day as needed for nausea or vomiting.  30 tablet  1  . Polyethyl Glycol-Propyl Glycol 0.4-0.3 % SOLN Place 1 drop into both eyes every morning. As needed      . prochlorperazine (COMPAZINE) 25 MG suppository Place 1 suppository (25 mg total) rectally every 12 (twelve) hours as needed for nausea.  12 suppository  3  . valACYclovir (VALTREX) 500 MG tablet Take 1 tablet (500 mg total) by mouth daily.  30 tablet  7   No current facility-administered medications for this visit.   Facility-Administered Medications Ordered in Other Visits  Medication Dose Route Frequency Provider Last Rate Last Dose  . influenza  inactive virus vaccine (FLUZONE/FLUARIX) injection 0.5 mL  0.5 mL Intramuscular Once  Rita Ohara, MD      . sodium chloride 0.9 % injection 10 mL  10 mL Intracatheter PRN Deatra Robinson, MD   10 mL at 02/28/13 1604  . sodium chloride 0.9 % injection 10 mL  10 mL Intravenous PRN Deatra Robinson, MD   10 mL at 05/29/13 1210     Allergies:  Allergies  Allergen Reactions  . Codeine Nausea And Vomiting       . Isosorbide Other (See Comments)    Extreme headaches  . Other Swelling  All Lip moisturizers except Vaseline.  . Phenothiazines Other (See Comments)    Makes her stop breathing.  Loma Messing [Pentazocine] Nausea And Vomiting  . Yellow Jacket Venom Anaphylaxis  . Ambien [Zolpidem Tartrate]     Bad dreams  . Decadron [Dexamethasone]     "Caused vision problems"  . Tegaderm Ag Mesh [Silver] Rash    USE ONLY OPSITE TO PAC    Medical History: Past Medical History  Diagnosis Date  . Interstitial cystitis     on chronic antibiotics  . Hyperlipidemia   . Frequent UTI     on prophylaxis  . 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 CAD (coronary artery disease)     a. s/p CABG 7/13;   b.  LHC 12/01/11:  pLAD 70%, mLAD 40%, CFX 40-50% prior to takeoff of the OM2, oRCA occluded, mid vessel filled via R->R collaterals and distal vessel filled by L->R collaterals, S-OM1/OM2 (small and diffusely dz) with mid 90% stenosis, 90% at OM1 anastomotic site, continuation of OM2 occluded, S-Dx patent, S-PDA occluded, L-LAD ok, EF 55-65%  => Med Rx rec.  [name] Hx of echocardiogram     Echo 5/13: EF 76-28%, grade 2 diastolic dysfunction  . Hypercholesterolemia   . Pleural effusion 12/28/11    s/p pleurx catheter  . GERD (gastroesophageal reflux disease)   . Arthritis     "just a little; lower back" (12/28/11)  . Gout attack 08/2011    related to "stress post OHS"  . Depression   . Breast cancer     "left"; on Tamoxifen  . S/P thoracentesis 12/28/11    "for pleural effusion" (12/28/2011)  . Ovarian cancer   . Tachycardia   . Neuromuscular disorder     peripheral neuropathy  . Ovarian  cancer 02/28/2013    Surgical History:  Past Surgical History  Procedure Laterality Date  . Breast lumpectomy  04/2009    left  . Cesarean section  1973; 1976  . Muscle release  1960    L neck and chest.; "when I was 13; pneumonia settled in my left neck"  . Coronary artery bypass graft  08/18/2011    Procedure: CORONARY ARTERY BYPASS GRAFTING (CABG);  Surgeon: Gaye Pollack, MD;  Location: [place];  Service: Open Heart Surgery;  Laterality: N/A;  Coronary Artery Bypass Graft times five utilizing the left internal mammary artery and the left greater saphenous vein harvested endoscopically.  . Abdominal hysterectomy  1976  . Appendectomy  1976  . Portacath placement  01/06/2012    Procedure: INSERTION PORT-A-CATH;  Surgeon: Gaye Pollack, MD;  Location: [place];  Service: Thoracic;  Laterality: Left;  . Chest tube insertion  01/06/2012    Procedure: INSERTION PLEURAL DRAINAGE CATHETER;  Surgeon: Gaye Pollack, MD;  Location: [facility];  Service: Thoracic;  Laterality: Bilateral;  . Removal of pleural drainage catheter Right 04/12/2012    Procedure: MINOR REMOVAL OF PLEURAL DRAINAGE CATHETER;  Surgeon: Gaye Pollack, MD;  Location: [place];  Service: Thoracic;  Laterality: Right;  . [place] pleurodesis Left 04/12/2012    Procedure: Pietro Cassis;  Surgeon: Gaye Pollack, MD;  Location: [place];  Service: Thoracic;  Laterality: Left;  . Portacath placement Left 04/17/2012    Procedure: INSERTION PORT-A-CATH;  Surgeon: Gaye Pollack, MD;  Location: [facility];  Service: Thoracic;  Laterality: Left;  . Removal of pleural drainage catheter Left 04/17/2012    Procedure: REMOVAL OF PLEURAL DRAINAGE CATHETER;  Surgeon: Gaye Pollack,  MD;  Location: [place];  Service: Thoracic;  Laterality: Left;  . Port-a-cath removal Left 04/17/2012    Procedure: REMOVAL PORT-A-CATH;  Surgeon: Gaye Pollack, MD;  Location: [facility];  Service: Thoracic;  Laterality: Left;  . Cardiac catheterization    . Laparotomy Bilateral 05/30/2012     Procedure: Resection of umbilical mass, Partial omentectomy;  Surgeon: Alvino Chapel, MD;  Location: [facility];  Service: Gynecology;  Laterality: Bilateral;     Review of Systems: A 10 point review of systems was conducted and is otherwise negative except for what is noted above.     Physical Exam: Blood pressure 129/73, pulse 69, temperature 97.1 F (36.2 C), temperature source Oral, resp. rate 18, height 5\' 7"  (1.702 m), weight 151 lb 14.4 oz (68.901 kg). GENERAL: Patient is a chronicaly ill appearing female in no acute distress HEENT:  Sclerae anicteric.  Oropharynx clear and moist. No ulcerations or evidence of oropharyngeal candidiasis. Neck is supple.  NODES:  No cervical, supraclavicular, or axillary lymphadenopathy palpated.  BREAST EXAM:  Deferred. LUNGS:  Clear to auscultation bilaterally.  No wheezes or rhonchi. HEART:  Regular rate and rhythm. No murmur appreciated. ABDOMEN:  Soft, nontender.  Positive, normoactive bowel sounds. No organomegaly palpated. MSK:  No focal spinal tenderness to palpation. Full range of motion bilaterally in the upper extremities. EXTREMITIES:  No peripheral edema.   SKIN:  Clear with no obvious rashes or skin changes. No nail dyscrasia. NEURO:  Nonfocal. Well oriented.  Appropriate affect. ECOG PERFORMANCE STATUS: 1 - Symptomatic but completely ambulatory   Lab Results: Lab Results  Component Value Date   WBC 7.3 05/29/2013   HGB 10.6* 05/29/2013   HCT 33.9* 05/29/2013   MCV 97.0 05/29/2013   PLT 360 05/29/2013     Chemistry      Component Value Date/Time   NA 143 05/29/2013 0838   NA 134* 06/02/2012 0515   NA 141 12/17/2011 1144   K 4.2 05/29/2013 0838   K 4.4 06/02/2012 0515   CL 103 08/07/2012 1108   CL 100 06/02/2012 0515   CO2 22 05/29/2013 0838   CO2 25 06/02/2012 0515   BUN 15.2 05/29/2013 0838   BUN 20 06/02/2012 0515   BUN 15 12/17/2011 1144   CREATININE 0.8 05/29/2013 0838   CREATININE 0.76 06/02/2012 0515      Component  Value Date/Time   CALCIUM 9.5 05/29/2013 0838   CALCIUM 8.7 06/02/2012 0515   ALKPHOS 128 05/29/2013 0838   ALKPHOS 85 05/26/2012 0955   AST 33 05/29/2013 0838   AST 22 05/26/2012 0955   ALT 29 05/29/2013 0838   ALT 16 05/26/2012 0955   BILITOT 0.45 05/29/2013 0838   BILITOT 0.3 05/26/2012 0955     Assessment and Plan: [name] [name] 69 y.o. female with  1. Ovarian cancer diagnosed in November, 2013.  Please see prior history noted above.  She is here to restart treatment following left eye cataract removal.  Her CBC is stable. I reviewed it with her in detail.  Her CMP is pending.  She will proceed with cycle 11, day 1 of Topotecan therapy today.  Her Avastin is due next week.      2. H/o DCIS of the left breast s/p lumpectomy in March, 2011.  She underwent adjuvant radiation therapy, and following the completion of radiation was started on Tamoxifen in August, 2011.  Tamoxifen was discontinued due to her diagnosis of ovarian cancer.    3. Depression/anxiety.  On Effexor this is stable.    4. Shortness of breath.  This is stable.  She was evaluated by GI and put on a PPI in addition to diet changes.  She also had her cardiac medications changed by cardiology which also helped.  We will continue to monitor.    5. Steroid induced cataracts.  Patient underwent left eye cataract extraction on 05/09/13 by Dr. Caroleen Hamman at [facility] surgery.  The healing of the left eye has taken slightly longer than all of [place] had anticipated.  Due to this she has decided to forego an extraction of the right eye until a later time.    6. Neuropathy.  This has essentially resolved.  She will continue Gabapentin TID.    The patient will return in one week for labs, evaluation, cycle 11 day 8 of Topotecan, and her every 21 day Avastin.  She knows to call us in the interim for any questions or concerns.  We can certainly see her sooner if needed.    I spent 25 minutes counseling the patient face to face.   The total time spent in the appointment was 30 minutes.  Minette Headland, [place] [phone] 05/30/2013 8:54 AM   ADDENDUM:    I personally saw this patient and performed a substantive portion of this encounter with the listed APP documented above.   Patient with metastatic ovarian carcinoma originally diagnosed in November 2013. She most recently has been receiving palliative chemotherapy consisting of topotecan and Avastin. However we had to hold chemotherapy due to to extraction of cataracts. These have now been performed.  We will proceed with cycle 11 day 1 of topotecan today. She will receive her Avastin next week. She understands risks benefits and side effects of her therapy. All questions were answered today  Deatra Robinson, MD

## 2013-05-29 ENCOUNTER — Ambulatory Visit (HOSPITAL_BASED_OUTPATIENT_CLINIC_OR_DEPARTMENT_OTHER): Payer: Medicare Other

## 2013-05-29 ENCOUNTER — Ambulatory Visit (HOSPITAL_BASED_OUTPATIENT_CLINIC_OR_DEPARTMENT_OTHER): Payer: Medicare Other | Admitting: Adult Health

## 2013-05-29 ENCOUNTER — Encounter: Payer: Self-pay | Admitting: Adult Health

## 2013-05-29 ENCOUNTER — Other Ambulatory Visit: Payer: Self-pay | Admitting: *Deleted

## 2013-05-29 VITALS — BP 129/73 | HR 69 | Temp 97.1°F | Resp 18 | Ht 67.0 in | Wt 151.9 lb

## 2013-05-29 DIAGNOSIS — Z5111 Encounter for antineoplastic chemotherapy: Secondary | ICD-10-CM

## 2013-05-29 DIAGNOSIS — C801 Malignant (primary) neoplasm, unspecified: Secondary | ICD-10-CM

## 2013-05-29 DIAGNOSIS — Z95828 Presence of other vascular implants and grafts: Secondary | ICD-10-CM

## 2013-05-29 DIAGNOSIS — C569 Malignant neoplasm of unspecified ovary: Secondary | ICD-10-CM

## 2013-05-29 DIAGNOSIS — F411 Generalized anxiety disorder: Secondary | ICD-10-CM

## 2013-05-29 DIAGNOSIS — F3289 Other specified depressive episodes: Secondary | ICD-10-CM

## 2013-05-29 DIAGNOSIS — F329 Major depressive disorder, single episode, unspecified: Secondary | ICD-10-CM

## 2013-05-29 DIAGNOSIS — C786 Secondary malignant neoplasm of retroperitoneum and peritoneum: Secondary | ICD-10-CM

## 2013-05-29 DIAGNOSIS — R0602 Shortness of breath: Secondary | ICD-10-CM

## 2013-05-29 DIAGNOSIS — Z87898 Personal history of other specified conditions: Secondary | ICD-10-CM

## 2013-05-29 LAB — CBC WITH DIFFERENTIAL/PLATELET
BASO%: 0.8 % (ref 0.0–2.0)
Basophils Absolute: 0.1 10*3/uL (ref 0.0–0.1)
EOS ABS: 0.3 10*3/uL (ref 0.0–0.5)
EOS%: 4.7 % (ref 0.0–7.0)
HCT: 33.9 % — ABNORMAL LOW (ref 34.8–46.6)
HEMOGLOBIN: 10.6 g/dL — AB (ref 11.6–15.9)
LYMPH%: 11.3 % — AB (ref 14.0–49.7)
MCH: 30.4 pg (ref 25.1–34.0)
MCHC: 31.4 g/dL — ABNORMAL LOW (ref 31.5–36.0)
MCV: 97 fL (ref 79.5–101.0)
MONO#: 0.6 10*3/uL (ref 0.1–0.9)
MONO%: 8.6 % (ref 0.0–14.0)
NEUT%: 74.6 % (ref 38.4–76.8)
NEUTROS ABS: 5.4 10*3/uL (ref 1.5–6.5)
Platelets: 360 10*3/uL (ref 145–400)
RBC: 3.5 10*6/uL — ABNORMAL LOW (ref 3.70–5.45)
RDW: 22.1 % — ABNORMAL HIGH (ref 11.2–14.5)
WBC: 7.3 10*3/uL (ref 3.9–10.3)
lymph#: 0.8 10*3/uL — ABNORMAL LOW (ref 0.9–3.3)

## 2013-05-29 LAB — COMPREHENSIVE METABOLIC PANEL (CC13)
ALT: 29 U/L (ref 0–55)
ANION GAP: 14 meq/L — AB (ref 3–11)
AST: 33 U/L (ref 5–34)
Albumin: 3.5 g/dL (ref 3.5–5.0)
Alkaline Phosphatase: 128 U/L (ref 40–150)
BUN: 15.2 mg/dL (ref 7.0–26.0)
CO2: 22 meq/L (ref 22–29)
CREATININE: 0.8 mg/dL (ref 0.6–1.1)
Calcium: 9.5 mg/dL (ref 8.4–10.4)
Chloride: 107 mEq/L (ref 98–109)
GLUCOSE: 126 mg/dL (ref 70–140)
Potassium: 4.2 mEq/L (ref 3.5–5.1)
SODIUM: 143 meq/L (ref 136–145)
TOTAL PROTEIN: 7.2 g/dL (ref 6.4–8.3)
Total Bilirubin: 0.45 mg/dL (ref 0.20–1.20)

## 2013-05-29 MED ORDER — SODIUM CHLORIDE 0.9 % IJ SOLN
10.0000 mL | INTRAMUSCULAR | Status: DC | PRN
Start: 2013-05-29 — End: 2016-07-09
  Administered 2013-05-29 (×2): 10 mL via INTRAVENOUS
  Filled 2013-05-29: qty 10

## 2013-05-29 MED ORDER — DEXAMETHASONE SODIUM PHOSPHATE 10 MG/ML IJ SOLN
10.0000 mg | Freq: Once | INTRAMUSCULAR | Status: AC
  Administered 2013-05-29: 10 mg via INTRAVENOUS

## 2013-05-29 MED ORDER — SODIUM CHLORIDE 0.9 % IJ SOLN
10.0000 mL | INTRAMUSCULAR | Status: DC | PRN
  Filled 2013-05-29: qty 10

## 2013-05-29 MED ORDER — ONDANSETRON 8 MG/50ML IVPB (CHCC)
8.0000 mg | Freq: Once | INTRAVENOUS | Status: AC
  Administered 2013-05-29: 8 mg via INTRAVENOUS

## 2013-05-29 MED ORDER — TOPOTECAN HCL CHEMO INJECTION 4 MG
4.0000 mg/m2 | Freq: Once | INTRAVENOUS | Status: AC
  Administered 2013-05-29: 7 mg via INTRAVENOUS
  Filled 2013-05-29: qty 7

## 2013-05-29 MED ORDER — ONDANSETRON 8 MG/NS 50 ML IVPB
INTRAVENOUS | Status: AC
  Filled 2013-05-29: qty 8

## 2013-05-29 MED ORDER — HEPARIN SOD (PORK) LOCK FLUSH 100 UNIT/ML IV SOLN
500.0000 [IU] | Freq: Once | INTRAVENOUS | Status: AC | PRN
  Administered 2013-05-29: 500 [IU]
  Filled 2013-05-29: qty 5

## 2013-05-29 MED ORDER — DEXAMETHASONE SODIUM PHOSPHATE 10 MG/ML IJ SOLN
INTRAMUSCULAR | Status: AC
  Filled 2013-05-29: qty 1

## 2013-05-29 MED ORDER — SODIUM CHLORIDE 0.9 % IV SOLN
Freq: Once | INTRAVENOUS | Status: AC
  Administered 2013-05-29: 11:00:00 via INTRAVENOUS

## 2013-05-29 NOTE — Patient Instructions (Signed)

## 2013-05-29 NOTE — Patient Instructions (Signed)
[facility] Discharge Instructions for Patients Receiving Chemotherapy  Today you received the following chemotherapy agents topotecan  To help prevent nausea and vomiting after your treatment, we encourage you to take your nausea medication as directed   If you develop nausea and vomiting that is not controlled by your nausea medication, call the clinic.   BELOW ARE SYMPTOMS THAT SHOULD BE REPORTED IMMEDIATELY:  *FEVER GREATER THAN 100.5 F  *CHILLS WITH OR WITHOUT FEVER  NAUSEA AND VOMITING THAT IS NOT CONTROLLED WITH YOUR NAUSEA MEDICATION  *UNUSUAL SHORTNESS OF BREATH  *UNUSUAL BRUISING OR BLEEDING  TENDERNESS IN MOUTH AND THROAT WITH OR WITHOUT PRESENCE OF ULCERS  *URINARY PROBLEMS  *BOWEL PROBLEMS  UNUSUAL RASH Items with * indicate a potential emergency and should be followed up as soon as possible.  Feel free to call the clinic you have any questions or concerns. The clinic phone number is [phone].  

## 2013-05-29 NOTE — Patient Instructions (Signed)
You are doing well.  You will proceed with Topotecan today.  You will return next week for labs, evaluation, and Topotecan and Avastin.    Please call us if you have any questions or concerns.

## 2013-06-01 ENCOUNTER — Telehealth: Payer: Self-pay | Admitting: *Deleted

## 2013-06-01 ENCOUNTER — Telehealth: Payer: Self-pay | Admitting: Adult Health

## 2013-06-01 NOTE — Telephone Encounter (Signed)
, °

## 2013-06-01 NOTE — Telephone Encounter (Signed)
Per staff message and POF I have scheduled appts.  JMW  

## 2013-06-04 ENCOUNTER — Ambulatory Visit (HOSPITAL_BASED_OUTPATIENT_CLINIC_OR_DEPARTMENT_OTHER): Payer: Medicare Other

## 2013-06-04 ENCOUNTER — Encounter: Payer: Self-pay | Admitting: Adult Health

## 2013-06-04 ENCOUNTER — Ambulatory Visit (HOSPITAL_BASED_OUTPATIENT_CLINIC_OR_DEPARTMENT_OTHER): Payer: Medicare Other | Admitting: Adult Health

## 2013-06-04 VITALS — BP 135/66 | HR 65 | Temp 96.9°F | Resp 15

## 2013-06-04 VITALS — BP 135/66 | HR 65 | Temp 96.9°F | Resp 15 | Ht 67.0 in | Wt 151.8 lb

## 2013-06-04 DIAGNOSIS — C786 Secondary malignant neoplasm of retroperitoneum and peritoneum: Secondary | ICD-10-CM

## 2013-06-04 DIAGNOSIS — Z95828 Presence of other vascular implants and grafts: Secondary | ICD-10-CM

## 2013-06-04 DIAGNOSIS — G589 Mononeuropathy, unspecified: Secondary | ICD-10-CM

## 2013-06-04 DIAGNOSIS — Z5112 Encounter for antineoplastic immunotherapy: Secondary | ICD-10-CM

## 2013-06-04 DIAGNOSIS — C50919 Malignant neoplasm of unspecified site of unspecified female breast: Secondary | ICD-10-CM

## 2013-06-04 DIAGNOSIS — C801 Malignant (primary) neoplasm, unspecified: Secondary | ICD-10-CM

## 2013-06-04 DIAGNOSIS — C569 Malignant neoplasm of unspecified ovary: Secondary | ICD-10-CM

## 2013-06-04 DIAGNOSIS — Z5111 Encounter for antineoplastic chemotherapy: Secondary | ICD-10-CM

## 2013-06-04 DIAGNOSIS — D0590 Unspecified type of carcinoma in situ of unspecified breast: Secondary | ICD-10-CM

## 2013-06-04 DIAGNOSIS — R0602 Shortness of breath: Secondary | ICD-10-CM

## 2013-06-04 LAB — COMPREHENSIVE METABOLIC PANEL (CC13)
ALT: 23 U/L (ref 0–55)
ANION GAP: 10 meq/L (ref 3–11)
AST: 28 U/L (ref 5–34)
Albumin: 3.3 g/dL — ABNORMAL LOW (ref 3.5–5.0)
Alkaline Phosphatase: 91 U/L (ref 40–150)
BUN: 16.5 mg/dL (ref 7.0–26.0)
CO2: 19 meq/L — AB (ref 22–29)
Calcium: 9.2 mg/dL (ref 8.4–10.4)
Chloride: 112 mEq/L — ABNORMAL HIGH (ref 98–109)
Creatinine: 0.7 mg/dL (ref 0.6–1.1)
Glucose: 96 mg/dl (ref 70–140)
Potassium: 4.1 mEq/L (ref 3.5–5.1)
SODIUM: 141 meq/L (ref 136–145)
TOTAL PROTEIN: 6.6 g/dL (ref 6.4–8.3)
Total Bilirubin: 0.44 mg/dL (ref 0.20–1.20)

## 2013-06-04 LAB — CBC WITH DIFFERENTIAL/PLATELET
BASO%: 1.2 % (ref 0.0–2.0)
Basophils Absolute: 0 10*3/uL (ref 0.0–0.1)
EOS%: 6.1 % (ref 0.0–7.0)
Eosinophils Absolute: 0.2 10*3/uL (ref 0.0–0.5)
HCT: 33.8 % — ABNORMAL LOW (ref 34.8–46.6)
HGB: 10.4 g/dL — ABNORMAL LOW (ref 11.6–15.9)
LYMPH#: 0.5 10*3/uL — AB (ref 0.9–3.3)
LYMPH%: 15.8 % (ref 14.0–49.7)
MCH: 29.9 pg (ref 25.1–34.0)
MCHC: 30.8 g/dL — AB (ref 31.5–36.0)
MCV: 97.1 fL (ref 79.5–101.0)
MONO#: 0.2 10*3/uL (ref 0.1–0.9)
MONO%: 5.8 % (ref 0.0–14.0)
NEUT#: 2.3 10*3/uL (ref 1.5–6.5)
NEUT%: 71.1 % (ref 38.4–76.8)
Platelets: 191 10*3/uL (ref 145–400)
RBC: 3.48 10*6/uL — AB (ref 3.70–5.45)
RDW: 19.5 % — ABNORMAL HIGH (ref 11.2–14.5)
WBC: 3.3 10*3/uL — AB (ref 3.9–10.3)

## 2013-06-04 LAB — UA PROTEIN, DIPSTICK - CHCC: PROTEIN: NEGATIVE mg/dL

## 2013-06-04 MED ORDER — SODIUM CHLORIDE 0.9 % IV SOLN
Freq: Once | INTRAVENOUS | Status: AC
  Administered 2013-06-04: 14:00:00 via INTRAVENOUS

## 2013-06-04 MED ORDER — ONDANSETRON 8 MG/50ML IVPB (CHCC)
8.0000 mg | Freq: Once | INTRAVENOUS | Status: AC
  Administered 2013-06-04: 8 mg via INTRAVENOUS

## 2013-06-04 MED ORDER — TOPOTECAN HCL CHEMO INJECTION 4 MG
4.0000 mg/m2 | Freq: Once | INTRAVENOUS | Status: AC
  Administered 2013-06-04: 7 mg via INTRAVENOUS
  Filled 2013-06-04: qty 7

## 2013-06-04 MED ORDER — ONDANSETRON 8 MG/NS 50 ML IVPB
INTRAVENOUS | Status: AC
  Filled 2013-06-04: qty 8

## 2013-06-04 MED ORDER — DEXAMETHASONE SODIUM PHOSPHATE 10 MG/ML IJ SOLN
10.0000 mg | Freq: Once | INTRAMUSCULAR | Status: AC
  Administered 2013-06-04: 10 mg via INTRAVENOUS

## 2013-06-04 MED ORDER — DEXAMETHASONE SODIUM PHOSPHATE 10 MG/ML IJ SOLN
INTRAMUSCULAR | Status: AC
  Filled 2013-06-04: qty 1

## 2013-06-04 MED ORDER — SODIUM CHLORIDE 0.9 % IJ SOLN
10.0000 mL | INTRAMUSCULAR | Status: DC | PRN
  Filled 2013-06-04: qty 10

## 2013-06-04 MED ORDER — HEPARIN SOD (PORK) LOCK FLUSH 100 UNIT/ML IV SOLN
500.0000 [IU] | Freq: Once | INTRAVENOUS | Status: AC | PRN
  Administered 2013-06-04: 500 [IU]
  Filled 2013-06-04: qty 5

## 2013-06-04 MED ORDER — SODIUM CHLORIDE 0.9 % IJ SOLN
10.0000 mL | INTRAMUSCULAR | Status: DC | PRN
  Administered 2013-06-04 (×2): 10 mL via INTRAVENOUS
  Filled 2013-06-04: qty 10

## 2013-06-04 MED ORDER — SODIUM CHLORIDE 0.9 % IV SOLN
15.0000 mg/kg | Freq: Once | INTRAVENOUS | Status: AC
  Administered 2013-06-04: 1025 mg via INTRAVENOUS
  Filled 2013-06-04: qty 41

## 2013-06-04 NOTE — Progress Notes (Signed)
Hematology and Oncology Follow Up Visit  Kayana Folden [id number] 29-Mar-1945 68 y.o. 06/04/2013 1:47 PM     Principle Diagnosis:[place] M. [place] 68 y.o. 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   Prior Therapy: #1. 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 Patient had 3 sentinel biopsied all of them were negative for metastatic disease. Patient underwent radiation therapy between 06/05/2009 through 07/02/2009. She was then begun on Tamoxifen 20 mg daily in 09/2009.   #2 In 12/2011 she received the diagnosis of gynecologic malignancy presenting with abdominal mass and pleural effusions. Patient was originally hospitalized with shortness of breath in 12/2011. 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 She also had malignant ascites. She underwent bilateral thoracentesis and paracentesis procedures during her hospitalization. During her hospitalization she was seen by gynecologic oncology.   #3 Patient began neoadjuvant chemotherapy consisting of Taxol and Carboplatinum. Her first cycle was administered during her hospitalization. She has completed a total of 6 cycles of Taxol/Carboplatinum from 01/07/2012 - 06/23/2012. Overall she tolerated it well except for some fatigue and neuropathies.   #4 Patient is status post laparotomy in 05/2012 that revealed significant residual disease.   #5 Patient began adjuvant therapy with Topotecan/Avastin starting on 07/24/2012.   Current therapy:  Topotecan given on day 1 day 8, and day 15 of a 21 day cycle and Avastin given every 21 days  Interim History: Kirby Funk 68 y.o. female with ovarian cancer.  She is here for evaluation prior to Topotecan and Avastin therapy.  She recently had left cataract surgery.  She had a delay  in healing in the left eye.  Her left eye feels much improved today.  She is going this week to get new glasses.  She is doing well today.  She denies fevers, chills, nausea, vomiting, constipation, diarrhea, easy bruising/bleeding, or any further concerns.  She does tell me that some of her shortness of breath has returned that is not nearly as bad as it previously was.  Otherwise, a 10 point ROS is neg.   Medications:  Current Outpatient Prescriptions  Medication Sig Dispense Refill  . ALPRAZolam (XANAX) 0.25 MG tablet Take 1 tablet (0.25 mg total) by mouth every 8 (eight) hours as needed for anxiety.  30 tablet  4  . Alum & Mag Hydroxide-Simeth (MAGIC MOUTHWASH) SOLN Take 5 mLs by mouth 4 (four) times daily. Take 5 ml by mouth four times daily as needed.  240 mL  2  . aspirin 325 MG tablet Take 325 mg by mouth at bedtime.       [name] atorvastatin (LIPITOR) 20 MG tablet TAKE ONE TABLET BY MOUTH ONCE DAILY  90 tablet  3  . bisoprolol (ZEBETA) 10 MG tablet Take 1 tablet (10 mg total) by mouth daily.  30 tablet  6  . ciprofloxacin (CIPRO) 250 MG tablet       . docusate sodium (COLACE) 100 MG capsule Take 200 mg by mouth daily.      [name] EPINEPHrine (EPIPEN 2-PAK) 0.3 mg/0.3 mL DEVI Inject 0.3 mg into the muscle daily as needed (allergic reaction).       . famotidine (PEPCID) 20 MG tablet Take 20 mg by mouth. Take 1 tablet at bedtime by mouth      . fluticasone (FLONASE) 50 MCG/ACT nasal  spray Place 2 sprays into the nose daily as needed for allergies.      [name] gabapentin (NEURONTIN) 100 MG capsule TAKE ONE CAPSULE  DAILY      . lidocaine-prilocaine (EMLA) cream Apply topically as needed.  30 g  1  . LORazepam (ATIVAN) 0.5 MG tablet Take 0.5 mg by mouth every 6 (six) hours as needed for anxiety (take one tab every 6 hours as needed for nausea and vomiting).      . nitroGLYCERIN (NITROSTAT) 0.4 MG SL tablet Place 0.4 mg under the tongue every 5 (five) minutes as needed. Chest pain      . omeprazole (PRILOSEC)  20 MG capsule Take 40 mg by mouth daily.      . ondansetron (ZOFRAN) 8 MG tablet Take 1 tablet (8 mg total) by mouth 2 (two) times daily. Take two times a day starting the day after chemo for 2 days. Then take two times a day as needed for nausea or vomiting.  30 tablet  1  . oxybutynin (DITROPAN-XL) 10 MG 24 hr tablet Take 10 mg by mouth daily.      Vladimir Faster Glycol-Propyl Glycol 0.4-0.3 % SOLN Place 1 drop into both eyes every morning. As needed      . prochlorperazine (COMPAZINE) 25 MG suppository Place 1 suppository (25 mg total) rectally every 12 (twelve) hours as needed for nausea.  12 suppository  3  . senna (SENOKOT) 8.6 MG tablet Take 1 tablet by mouth daily as needed for constipation.      . SF 5000 PLUS 1.1 % CREA dental cream Place 1.1 % onto teeth at bedtime.       [name] trimethoprim (TRIMPEX) 100 MG tablet Take 100 mg by mouth every morning.       . valACYclovir (VALTREX) 500 MG tablet Take 1 tablet (500 mg total) by mouth daily.  30 tablet  7  . venlafaxine XR (EFFEXOR-XR) 75 MG 24 hr capsule TAKE ONE CAPSULE EACH DAY  30 capsule  4   No current facility-administered medications for this visit.   Facility-Administered Medications Ordered in Other Visits  Medication Dose Route Frequency Provider Last Rate Last Dose  . dexamethasone (DECADRON) injection 10 mg  10 mg Intravenous Once Deatra Robinson, MD      . heparin lock flush 100 unit/mL  500 Units Intracatheter Once PRN Deatra Robinson, MD      . influenza  inactive virus vaccine (FLUZONE/FLUARIX) injection 0.5 mL  0.5 mL Intramuscular Once Rita Ohara, MD      . ondansetron (ZOFRAN) IVPB 8 mg  8 mg Intravenous Once Deatra Robinson, MD      . sodium chloride 0.9 % injection 10 mL  10 mL Intracatheter PRN Deatra Robinson, MD   10 mL at 02/28/13 1604  . sodium chloride 0.9 % injection 10 mL  10 mL Intravenous PRN Deatra Robinson, MD   10 mL at 05/29/13 1210  . sodium chloride 0.9 % injection 10 mL  10 mL Intravenous PRN Deatra Robinson, MD    10 mL at 06/04/13 1031  . sodium chloride 0.9 % injection 10 mL  10 mL Intracatheter PRN Deatra Robinson, MD      . topotecan (HYCAMTIN) 7 mg in sodium chloride 0.9 % 100 mL chemo infusion  4 mg/m2 (Order-Specific) Intravenous Once Deatra Robinson, MD         Allergies:  Allergies  Allergen Reactions  . Codeine Nausea And Vomiting       .  Isosorbide Other (See Comments)    Extreme headaches  . Other Swelling    All Lip moisturizers except Vaseline.  . Phenothiazines Other (See Comments)    Makes her stop breathing.  Loma Messing [Pentazocine] Nausea And Vomiting  . Yellow Jacket Venom Anaphylaxis  . Ambien [Zolpidem Tartrate]     Bad dreams  . Decadron [Dexamethasone]     "Caused vision problems"  . Tegaderm Ag Mesh [Silver] Rash    USE ONLY OPSITE TO PAC    Medical History: Past Medical History  Diagnosis Date  . Interstitial cystitis     on chronic antibiotics  . Hyperlipidemia   . Frequent UTI     on prophylaxis  . 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 CAD (coronary artery disease)     a. s/p CABG 7/13;   b.  LHC 12/01/11:  pLAD 70%, mLAD 40%, CFX 40-50% prior to takeoff of the OM2, oRCA occluded, mid vessel filled via R->R collaterals and distal vessel filled by L->R collaterals, S-OM1/OM2 (small and diffusely dz) with mid 90% stenosis, 90% at OM1 anastomotic site, continuation of OM2 occluded, S-Dx patent, S-PDA occluded, L-LAD ok, EF 55-65%  => Med Rx rec.  [name] Hx of echocardiogram     Echo 5/13: EF 19-37%, grade 2 diastolic dysfunction  . Hypercholesterolemia   . Pleural effusion 12/28/11    s/p pleurx catheter  . GERD (gastroesophageal reflux disease)   . Arthritis     "just a little; lower back" (12/28/11)  . Gout attack 08/2011    related to "stress post OHS"  . Depression   . Breast cancer     "left"; on Tamoxifen  . S/P thoracentesis 12/28/11    "for pleural effusion" (12/28/2011)  . Ovarian cancer   . Tachycardia   . Neuromuscular disorder     peripheral neuropathy   . Ovarian cancer 02/28/2013    Surgical History:  Past Surgical History  Procedure Laterality Date  . Breast lumpectomy  04/2009    left  . Cesarean section  1973; 1976  . Muscle release  1960    L neck and chest.; "when I was 13; pneumonia settled in my left neck"  . Coronary artery bypass graft  08/18/2011    Procedure: CORONARY ARTERY BYPASS GRAFTING (CABG);  Surgeon: Gaye Pollack, MD;  Location: [facility];  Service: Open Heart Surgery;  Laterality: N/A;  Coronary Artery Bypass Graft times five utilizing the left internal mammary artery and the left greater saphenous vein harvested endoscopically.  . Abdominal hysterectomy  1976  . Appendectomy  1976  . Portacath placement  01/06/2012    Procedure: INSERTION PORT-A-CATH;  Surgeon: Gaye Pollack, MD;  Location: [facility];  Service: Thoracic;  Laterality: Left;  . Chest tube insertion  01/06/2012    Procedure: INSERTION PLEURAL DRAINAGE CATHETER;  Surgeon: Gaye Pollack, MD;  Location: [facility];  Service: Thoracic;  Laterality: Bilateral;  . Removal of pleural drainage catheter Right 04/12/2012    Procedure: MINOR REMOVAL OF PLEURAL DRAINAGE CATHETER;  Surgeon: Gaye Pollack, MD;  Location: [facility];  Service: Thoracic;  Laterality: Right;  . [place]c pleurodesis Left 04/12/2012    Procedure: Pietro Cassis;  Surgeon: Gaye Pollack, MD;  Location: [facility];  Service: Thoracic;  Laterality: Left;  . Portacath placement Left 04/17/2012    Procedure: INSERTION PORT-A-CATH;  Surgeon: Gaye Pollack, MD;  Location: [facility];  Service: Thoracic;  Laterality: Left;  . Removal of pleural drainage catheter  Left 04/17/2012    Procedure: REMOVAL OF PLEURAL DRAINAGE CATHETER;  Surgeon: Gaye Pollack, MD;  Location: [facility];  Service: Thoracic;  Laterality: Left;  . Port-a-cath removal Left 04/17/2012    Procedure: REMOVAL PORT-A-CATH;  Surgeon: Gaye Pollack, MD;  Location: [facility];  Service: Thoracic;  Laterality: Left;  . Cardiac catheterization    . Laparotomy Bilateral  05/30/2012    Procedure: Resection of umbilical mass, Partial omentectomy;  Surgeon: Alvino Chapel, MD;  Location: [facility];  Service: Gynecology;  Laterality: Bilateral;     Review of Systems: A 10 point review of systems was conducted and is otherwise negative except for what is noted above.     Physical Exam: Blood pressure 135/66, pulse 65, temperature 96.9 F (36.1 C), temperature source Oral, resp. rate 15, height 5\' 7"  (1.702 m), weight 151 lb 12.8 oz (68.856 kg). GENERAL: Patient is a well appearing female in no acute distress HEENT:  Sclerae anicteric.  Oropharynx clear and moist. No ulcerations or evidence of oropharyngeal candidiasis. Neck is supple.  NODES:  No cervical, supraclavicular, or axillary lymphadenopathy palpated.  BREAST EXAM:  Deferred. LUNGS:  Clear to auscultation bilaterally.  No wheezes or rhonchi. HEART:  Regular rate and rhythm. No murmur appreciated. ABDOMEN:  Soft, nontender.  Positive, normoactive bowel sounds. No organomegaly palpated. MSK:  No focal spinal tenderness to palpation. Full range of motion bilaterally in the upper extremities. EXTREMITIES:  No peripheral edema.   SKIN:  Clear with no obvious rashes or skin changes. No nail dyscrasia. NEURO:  Nonfocal. Well oriented.  Appropriate affect. ECOG PERFORMANCE STATUS: 1 - Symptomatic but completely ambulatory   Lab Results: Lab Results  Component Value Date   WBC 3.3* 06/04/2013   HGB 10.4* 06/04/2013   HCT 33.8* 06/04/2013   MCV 97.1 06/04/2013   PLT 191 06/04/2013     Chemistry      Component Value Date/Time   NA 141 06/04/2013 1025   NA 134* 06/02/2012 0515   NA 141 12/17/2011 1144   K 4.1 06/04/2013 1025   K 4.4 06/02/2012 0515   CL 103 08/07/2012 1108   CL 100 06/02/2012 0515   CO2 19* 06/04/2013 1025   CO2 25 06/02/2012 0515   BUN 16.5 06/04/2013 1025   BUN 20 06/02/2012 0515   BUN 15 12/17/2011 1144   CREATININE 0.7 06/04/2013 1025   CREATININE 0.76 06/02/2012 0515       Component Value Date/Time   CALCIUM 9.2 06/04/2013 1025   CALCIUM 8.7 06/02/2012 0515   ALKPHOS 91 06/04/2013 1025   ALKPHOS 85 05/26/2012 0955   AST 28 06/04/2013 1025   AST 22 05/26/2012 0955   ALT 23 06/04/2013 1025   ALT 16 05/26/2012 0955   BILITOT 0.44 06/04/2013 1025   BILITOT 0.3 05/26/2012 0955      Assessment and Plan: Kirby Funk 68 y.o. female with  1. Ovarian cancer.  Please see prior history above.  She is here to receive Topotecan cycle 11 day 8 and Avastin therapy.  She is doing well and will proceed with this today.  Her labs are stable.  She will proceed with therapy today.    2. H/o DCIS of the left breast s/p lumpectomy in March, 2011. She underwent adjuvant radiation therapy, and following the completion of radiation was started on Tamoxifen in August, 2011. Tamoxifen was discontinued due to her diagnosis of ovarian cancer.  3. Shortness of breath. This is stable. She was evaluated  by GI and put on a PPI in addition to diet changes. She also had her cardiac medications changed by cardiology which also helped. This is slightly worse since starting back the Dexamethasone for her nausea with treatment.  For now, we will continue to monitor.   4. Steroid induced cataracts. Patient underwent left eye cataract extraction on 05/09/13 by Dr. Caroleen Hamman at [facility]. The healing of the left eye has taken slightly longer than all of [place] had anticipated. Today her eye is much improved from prior.  Due to this she has decided to forego an extraction of the right eye until a later time.   5. Neuropathy.  This is stable.  She will continue Gabapentin.   The patient will return in one week for labs, evaluation, and cycle 11 day 15 of Topotecan.   She knows to call us in the interim for any questions or concerns.  We can certainly see her sooner if needed.  I spent 25 minutes counseling the patient face to face.  The total time spent in the appointment was 30  minutes.  Minette Headland, [place] [phone] 06/04/2013 1:47 PM

## 2013-06-04 NOTE — Patient Instructions (Signed)

## 2013-06-04 NOTE — Patient Instructions (Signed)
You are doing well.  You will proceed with Avastin and Topotecan today.  We will see you back next week for Topotecan only.  Please call us if you have any questions or concerns.

## 2013-06-06 ENCOUNTER — Telehealth: Payer: Self-pay | Admitting: Oncology

## 2013-06-06 ENCOUNTER — Other Ambulatory Visit: Payer: Self-pay | Admitting: Oncology

## 2013-06-06 DIAGNOSIS — C569 Malignant neoplasm of unspecified ovary: Secondary | ICD-10-CM

## 2013-06-06 NOTE — Telephone Encounter (Signed)
per [place] moved 4/28 appt w/LC to 4/23 w/LL - KK out. s/w pt husband re change and new d/t for 4/23 @ 10am for lb/LL. tx remains 4/28 - husband aware.

## 2013-06-07 ENCOUNTER — Ambulatory Visit (HOSPITAL_BASED_OUTPATIENT_CLINIC_OR_DEPARTMENT_OTHER): Payer: Medicare Other | Admitting: Oncology

## 2013-06-07 ENCOUNTER — Encounter: Payer: Self-pay | Admitting: Oncology

## 2013-06-07 ENCOUNTER — Other Ambulatory Visit (HOSPITAL_BASED_OUTPATIENT_CLINIC_OR_DEPARTMENT_OTHER): Payer: Medicare Other

## 2013-06-07 VITALS — BP 151/84 | HR 66 | Temp 98.0°F | Resp 18 | Ht 67.0 in | Wt 151.7 lb

## 2013-06-07 DIAGNOSIS — D0590 Unspecified type of carcinoma in situ of unspecified breast: Secondary | ICD-10-CM

## 2013-06-07 DIAGNOSIS — C801 Malignant (primary) neoplasm, unspecified: Secondary | ICD-10-CM

## 2013-06-07 DIAGNOSIS — Z87891 Personal history of nicotine dependence: Secondary | ICD-10-CM

## 2013-06-07 DIAGNOSIS — C569 Malignant neoplasm of unspecified ovary: Secondary | ICD-10-CM

## 2013-06-07 DIAGNOSIS — Z803 Family history of malignant neoplasm of breast: Secondary | ICD-10-CM

## 2013-06-07 LAB — CBC WITH DIFFERENTIAL/PLATELET
BASO%: 0.2 % (ref 0.0–2.0)
Basophils Absolute: 0 10*3/uL (ref 0.0–0.1)
EOS ABS: 0.1 10*3/uL (ref 0.0–0.5)
EOS%: 1 % (ref 0.0–7.0)
HCT: 32.9 % — ABNORMAL LOW (ref 34.8–46.6)
HGB: 10.2 g/dL — ABNORMAL LOW (ref 11.6–15.9)
LYMPH%: 19.4 % (ref 14.0–49.7)
MCH: 29.8 pg (ref 25.1–34.0)
MCHC: 31.1 g/dL — ABNORMAL LOW (ref 31.5–36.0)
MCV: 95.8 fL (ref 79.5–101.0)
MONO#: 0 10*3/uL — ABNORMAL LOW (ref 0.1–0.9)
MONO%: 0.6 % (ref 0.0–14.0)
NEUT%: 78.8 % — ABNORMAL HIGH (ref 38.4–76.8)
NEUTROS ABS: 4.3 10*3/uL (ref 1.5–6.5)
Platelets: 259 10*3/uL (ref 145–400)
RBC: 3.44 10*6/uL — AB (ref 3.70–5.45)
RDW: 20.2 % — AB (ref 11.2–14.5)
WBC: 5.5 10*3/uL (ref 3.9–10.3)
lymph#: 1.1 10*3/uL (ref 0.9–3.3)

## 2013-06-07 LAB — COMPREHENSIVE METABOLIC PANEL (CC13)
ALBUMIN: 3.8 g/dL (ref 3.5–5.0)
ALT: 38 U/L (ref 0–55)
AST: 28 U/L (ref 5–34)
Alkaline Phosphatase: 95 U/L (ref 40–150)
Anion Gap: 14 mEq/L — ABNORMAL HIGH (ref 3–11)
BUN: 21.3 mg/dL (ref 7.0–26.0)
CO2: 22 mEq/L (ref 22–29)
Calcium: 9.7 mg/dL (ref 8.4–10.4)
Chloride: 108 mEq/L (ref 98–109)
Creatinine: 0.8 mg/dL (ref 0.6–1.1)
GLUCOSE: 95 mg/dL (ref 70–140)
POTASSIUM: 3.9 meq/L (ref 3.5–5.1)
Sodium: 143 mEq/L (ref 136–145)
Total Bilirubin: 0.48 mg/dL (ref 0.20–1.20)
Total Protein: 7.3 g/dL (ref 6.4–8.3)

## 2013-06-07 NOTE — Progress Notes (Signed)
OFFICE PROGRESS NOTE   06/07/2013   Physicians: Kelby Aline (PCP), Nancy Marus, Rolm Bookbinder, Marlinda Mike, (B.Bartle), Clois Comber. [place]  INTERVAL HISTORY:  Patient is seen, together with husband, for first time by this MD, in Dr Laurelyn Sickle absence. I have reviewed information extensively in this EMR and taken detailed history from patient and husband additionally. 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 Past history is significant for DCIS left breast for which she received tamoxifen from 09-2009 thru ~ 12-2011 when she was found to have the gyn cancer. Topotecan is given days 02-22-13 q 28 days and avastin q 21 days (15 mg/kg). Most recent treatment was day 8 cycle 11 topotecan, and avastin, on 06-04-13. She had been off schedule recently for left cataract extraction 05-09-13 by Dr Lucita Ferrara (rapid development of cataracts thought related to steroids used with chemotherapy). She has recovered well from the cataract surgery. She saw Dr Alycia Rossetti last on 05-02-13, follow up with her when necessary as patient doing so well on present treatment regimen.  Last CA 125 02-28-13 was 68.5, this having been 54 in Nov 2014, 30 in 09-2012 and 166 in 06-2012.  Last CT CAP was 03-08-13, last PET 11-2012 and last bilateral mammograms at [facility] 03-06-13.  She has PAC, which I believe does not always draw blood easily.  Note also history CAD, post CABG. Six month folow up appointment with Dr Acie Fredrickson is in early June.  Patient continues to tolerate the topotecan and avastin generally well. She had a bit more nausea since omitting steroids with and after chemo, but compazine is helpful.  Appetite is adequate and she can drink water, hot tea or cherry coke. Bowels move with Senokot S. She has some DOE such as walking around block or gardening, history of wheezing previously for which she was evauated by Dr Melvyn Novas. She denies pedal edema. She has  occasional minimal epistaxis, no other beeding.    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 Patient had 3 sentinel biopsied all of them were negative for metastatic disease. Patient underwent radiation therapy between 06/05/2009 through 07/02/2009. She was then begun on Tamoxifen 20 mg daily in 09/2009.  #2 In 12/2011 she received the diagnosis of gynecologic malignancy presenting with abdominal mass and pleural effusions. Patient was originally hospitalized with shortness of breath in 12/2011. 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 She also had malignant ascites. She underwent bilateral thoracentesis and paracentesis procedures during her hospitalization. During her hospitalization she was seen by gynecologic oncology.  #3 Patient began neoadjuvant chemotherapy consisting of Taxol and Carboplatinum. Her first cycle was administered during her hospitalization. She completed  6 cycles of Taxol/Carboplatinum from 01/07/2012 - 06/23/2012. Per patient "very sick with initial chemo and it did not  work" #4. laparotomy in 05/2012 that revealed significant residual disease.  #5 Patient began adjuvant therapy with Topotecan/Avastin starting on 07/24/2012.   Review of systems as above,  also: No fever or symptoms of infection. Dry mouth, uses Biotene. First PAC dehisced. BP ok at home. Some clear rhinorrhea, seems allergic.Only pain is at [facility] if lies on that side or with sneeze. Remainder of 10 point Review of Systems negative.  Social History: married x44 years, lives in [place]. Originally from [place]. 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 Husband is Geophysicist/field seismologist. Stopped smoking 2003  Past Medical/ Surgical History As above, also:  CABG x 5 08-2011 (Bartle) biateral pleurex catheters for malignant pleural effusions 12-2011 (Bartle) PAC x2, most recenty 04-2012 (Bartle) Interstitial cystitis Elevated lipids Left lumpectomy 04-2009, followed by RT and tamoxifen x 2 years Partial hysterectomy 1976 Appendectomy  Family History Breast cancer in mother age 60 Father with MI at 72, hepatitis Brother CABG in 68s   Objective:  Vital signs in last 24 hours:  BP 151/84  Pulse 66  Temp(Src) 98 F (36.7 C) (Oral)  Resp 18  Ht 5\' 7"  (1.702 m)  Wt 151 lb 11.2 oz (68.811 kg)  BMI 23.75 kg/m2 Weight stable. Alert, oriented and appropriate. Ambulatory without difficulty.  Hair thin but not complete alopecia Pleasant, good historian as is her husband. HEENT:PERRL, sclerae not icteric and left not injected, no periorbital swelling. Oral mucosa rather dry without lesions, posterior pharynx clear.  Neck supple. No JVD.  Lymphatics:no cervical,suraclavicular, axillary or inguinal adenopathy Resp: clear to auscultation to lower fields bilaterally Cardio: regular rate and rhythm. No gallop. Clear heart sounds GI: soft, nontender, not obviously distended, full thruout without obvious HSM, no mass appreciable at umbilicus. Normally active bowel sounds. Surgical incision not remarkable. Musculoskeletal/ Extremities: without pitting edema, cords, tenderness upper or LE. Neuro: peripheral  neuropathy as noted since taxol. Psych: normal mood and affect Skin without rash, ecchymosis, petechiae Breasts:Left with well healed lumpectomy scar, no dominant mass or skin/ nipple findings of concern; right without dominant mass, skin or nipple findings. Axillae benign. Portacath-without erythema or tenderness  Lab Results:  Results for orders placed in visit on 06/07/13  COMPREHENSIVE METABOLIC PANEL (LO75)      Result Value Ref Range   Sodium 143  136 - 145 mEq/L   Potassium 3.9  3.5 - 5.1 mEq/L   Chloride 108  98 - 109 mEq/L   CO2 22  22 - 29 mEq/L   Glucose 95  70 - 140 mg/dl   BUN 21.3  7.0 - 26.0 mg/dL   Creatinine 0.8  0.6 - 1.1 mg/dL   Total Bilirubin 0.48  0.20 - 1.20 mg/dL   Alkaline Phosphatase 95  40 - 150 U/L   AST 28  5 - 34 U/L   ALT 38  0 - 55 U/L   Total Protein 7.3  6.4 - 8.3 g/dL   Albumin 3.8  3.5 - 5.0 g/dL   Calcium 9.7  8.4 - 10.4 mg/dL   Anion Gap 14 (*) 3 - 11 mEq/L  CBC WITH DIFFERENTIAL      Result Value Ref Range   WBC 5.5  3.9 - 10.3 10e3/uL   NEUT# 4.3  1.5 - 6.5 10e3/uL   HGB 10.2 (*) 11.6 - 15.9 g/dL   HCT 32.9 (*) 34.8 - 46.6 %   Platelets 259  145 - 400 10e3/uL   MCV  95.8  79.5 - 101.0 fL   MCH 29.8  25.1 - 34.0 pg   MCHC 31.1 (*) 31.5 - 36.0 g/dL   RBC 3.44 (*) 3.70 - 5.45 10e6/uL   RDW 20.2 (*) 11.2 - 14.5 %   lymph# 1.1  0.9 - 3.3 10e3/uL   MONO# 0.0 (*) 0.1 - 0.9 10e3/uL   Eosinophils Absolute 0.1  0.0 - 0.5 10e3/uL   Basophils Absolute 0.0  0.0 - 0.1 10e3/uL   NEUT% 78.8 (*) 38.4 - 76.8 %   LYMPH% 19.4  14.0 - 49.7 %   MONO% 0.6  0.0 - 14.0 %   EOS% 1.0  0.0 - 7.0 %   BASO% 0.2  0.0 - 2.0 %     Studies/Results:  Imaging studies as above reviewed in EMR by this MD.  Medications: I have reviewed the patient's current medications.Gabapentin has helped peripheral neuropathy related to taxol. Attempt at chemotherapy resistance testing after 05-2012 surgery not successfu  DISCUSSION: Patient is in agreement with continuing  present treatment regimen and has already worked out her appointments over next several weeks as she prefers, including APP with treatments 4-28 and 5-12. I am glad to see her again also if I can be of help. I have added CA 125 to regular labs on 06-12-13.  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 Scans 02-2013 with persistent buky disease, tho clinically she continues to do reasonably well.  2.PAC in 3.CAD post 5 vessel CABG 08-2011 4.history left DCIS 04-2009, post lumpectomy, RT and 2 years tamoxifen which was [facility] with gyn cancer diagnosis 5.post remote hysterectomy 6. past tobacco, DCd 2003  I do not locate genetics testing in EMR, tho that may have been done; I did not discuss with her today. Per EMR, 1 child and brother.  Patient and husband are most appreciative of Dr Laurelyn Sickle care over past several years. They are in agreement with plan above.  Time spent 35 min including >50% counseling and coordination of care.  Gordy Levan, MD   06/07/2013, 8:43 PM

## 2013-06-08 ENCOUNTER — Telehealth: Payer: Self-pay | Admitting: Oncology

## 2013-06-08 NOTE — Telephone Encounter (Signed)
s/w pt re next appt for 4/28 @ 8:15am lb/LC/tx. pt to get new schedule 4/28.

## 2013-06-12 ENCOUNTER — Telehealth: Payer: Self-pay | Admitting: Adult Health

## 2013-06-12 ENCOUNTER — Ambulatory Visit: Payer: Medicare Other

## 2013-06-12 ENCOUNTER — Ambulatory Visit (HOSPITAL_BASED_OUTPATIENT_CLINIC_OR_DEPARTMENT_OTHER): Payer: Medicare Other | Admitting: Adult Health

## 2013-06-12 ENCOUNTER — Ambulatory Visit (HOSPITAL_BASED_OUTPATIENT_CLINIC_OR_DEPARTMENT_OTHER): Payer: Medicare Other

## 2013-06-12 ENCOUNTER — Encounter: Payer: Self-pay | Admitting: Adult Health

## 2013-06-12 ENCOUNTER — Other Ambulatory Visit: Payer: Medicare Other

## 2013-06-12 ENCOUNTER — Ambulatory Visit: Payer: Medicare Other | Admitting: Adult Health

## 2013-06-12 VITALS — BP 155/79 | HR 64 | Temp 97.9°F | Resp 20 | Ht 67.0 in | Wt 150.2 lb

## 2013-06-12 DIAGNOSIS — Z452 Encounter for adjustment and management of vascular access device: Secondary | ICD-10-CM

## 2013-06-12 DIAGNOSIS — R0602 Shortness of breath: Secondary | ICD-10-CM

## 2013-06-12 DIAGNOSIS — Z95828 Presence of other vascular implants and grafts: Secondary | ICD-10-CM

## 2013-06-12 DIAGNOSIS — C569 Malignant neoplasm of unspecified ovary: Secondary | ICD-10-CM

## 2013-06-12 DIAGNOSIS — C786 Secondary malignant neoplasm of retroperitoneum and peritoneum: Secondary | ICD-10-CM

## 2013-06-12 DIAGNOSIS — R197 Diarrhea, unspecified: Secondary | ICD-10-CM

## 2013-06-12 DIAGNOSIS — C801 Malignant (primary) neoplasm, unspecified: Secondary | ICD-10-CM

## 2013-06-12 DIAGNOSIS — C50919 Malignant neoplasm of unspecified site of unspecified female breast: Secondary | ICD-10-CM

## 2013-06-12 DIAGNOSIS — D0590 Unspecified type of carcinoma in situ of unspecified breast: Secondary | ICD-10-CM

## 2013-06-12 DIAGNOSIS — R110 Nausea: Secondary | ICD-10-CM

## 2013-06-12 DIAGNOSIS — G589 Mononeuropathy, unspecified: Secondary | ICD-10-CM

## 2013-06-12 LAB — CBC WITH DIFFERENTIAL/PLATELET
BASO%: 0.4 % (ref 0.0–2.0)
Basophils Absolute: 0 10*3/uL (ref 0.0–0.1)
EOS%: 4.5 % (ref 0.0–7.0)
Eosinophils Absolute: 0.1 10*3/uL (ref 0.0–0.5)
HCT: 32.6 % — ABNORMAL LOW (ref 34.8–46.6)
HGB: 10.1 g/dL — ABNORMAL LOW (ref 11.6–15.9)
LYMPH#: 0.9 10*3/uL (ref 0.9–3.3)
LYMPH%: 33 % (ref 14.0–49.7)
MCH: 29.7 pg (ref 25.1–34.0)
MCHC: 31 g/dL — ABNORMAL LOW (ref 31.5–36.0)
MCV: 95.9 fL (ref 79.5–101.0)
MONO#: 0.2 10*3/uL (ref 0.1–0.9)
MONO%: 6.1 % (ref 0.0–14.0)
NEUT%: 56 % (ref 38.4–76.8)
NEUTROS ABS: 1.5 10*3/uL (ref 1.5–6.5)
NRBC: 1 % — AB (ref 0–0)
PLATELETS: 63 10*3/uL — AB (ref 145–400)
RBC: 3.4 10*6/uL — AB (ref 3.70–5.45)
RDW: 19.5 % — AB (ref 11.2–14.5)
WBC: 2.6 10*3/uL — AB (ref 3.9–10.3)

## 2013-06-12 LAB — COMPREHENSIVE METABOLIC PANEL (CC13)
ALBUMIN: 3.5 g/dL (ref 3.5–5.0)
ALK PHOS: 99 U/L (ref 40–150)
ALT: 28 U/L (ref 0–55)
AST: 25 U/L (ref 5–34)
Anion Gap: 11 mEq/L (ref 3–11)
BUN: 12.8 mg/dL (ref 7.0–26.0)
CALCIUM: 9.9 mg/dL (ref 8.4–10.4)
CO2: 22 mEq/L (ref 22–29)
Chloride: 108 mEq/L (ref 98–109)
Creatinine: 0.7 mg/dL (ref 0.6–1.1)
Glucose: 94 mg/dl (ref 70–140)
POTASSIUM: 4.1 meq/L (ref 3.5–5.1)
Sodium: 140 mEq/L (ref 136–145)
Total Bilirubin: 0.37 mg/dL (ref 0.20–1.20)
Total Protein: 6.9 g/dL (ref 6.4–8.3)

## 2013-06-12 MED ORDER — ALTEPLASE 2 MG IJ SOLR
2.0000 mg | Freq: Once | INTRAMUSCULAR | Status: AC | PRN
  Administered 2013-06-12: 2 mg
  Filled 2013-06-12: qty 2

## 2013-06-12 MED ORDER — SODIUM CHLORIDE 0.9 % IJ SOLN
10.0000 mL | INTRAMUSCULAR | Status: DC | PRN
  Administered 2013-06-12: 10 mL via INTRAVENOUS
  Filled 2013-06-12: qty 10

## 2013-06-12 NOTE — Progress Notes (Signed)
No treatment today d/t low platelet count

## 2013-06-12 NOTE — Patient Instructions (Signed)
Bevacizumab injection  What is this medicine?  BEVACIZUMAB (be va SIZ yoo mab) is a chemotherapy drug. It targets a protein found in many cancer cell types, and halts cancer growth. This drug treats many cancers including non-small cell lung cancer, and colon or rectal cancer. It is usually given with other chemotherapy drugs.  This medicine may be used for other purposes; ask your health care provider or pharmacist if you have questions.  COMMON BRAND NAME(S): Avastin  What should I tell my health care provider before I take this medicine?  They need to know if you have any of these conditions:  -blood clots  -heart disease, including heart failure, heart attack, or chest pain (angina)  -high blood pressure  -infection (especially a virus infection such as chickenpox, cold sores, or herpes)  -kidney disease  -lung disease  -prior chemotherapy with doxorubicin, daunorubicin, epirubicin, or other anthracycline type chemotherapy agents  -recent or ongoing radiation therapy  -recent surgery  -stroke  -an unusual or allergic reaction to bevacizumab, hamster proteins, mouse proteins, other medicines, foods, dyes, or preservatives  -pregnant or trying to get pregnant  -breast-feeding  How should I use this medicine?  This medicine is for infusion into a vein. It is given by a health care professional in a hospital or clinic setting.  Talk to your pediatrician regarding the use of this medicine in children. Special care may be needed.  Overdosage: If you think you have taken too much of this medicine contact a poison control center or emergency room at once.  NOTE: This medicine is only for you. Do not share this medicine with others.  What if I miss a dose?  It is important not to miss your dose. Call your doctor or health care professional if you are unable to keep an appointment.  What may interact with this medicine?  Interactions are not expected.  This list may not describe all possible interactions. Give your health  care provider a list of all the medicines, herbs, non-prescription drugs, or dietary supplements you use. Also tell them if you smoke, drink alcohol, or use illegal drugs. Some items may interact with your medicine.  What should I watch for while using this medicine?  Your condition will be monitored carefully while you are receiving this medicine. You will need important blood work and urine testing done while you are taking this medicine.  During your treatment, let your health care professional know if you have any unusual symptoms, such as difficulty breathing.  This medicine may rarely cause 'gastrointestinal perforation' (holes in the stomach, intestines or colon), a serious side effect requiring surgery to repair.  This medicine should be started at least 28 days following major surgery and the site of the surgery should be totally healed. Check with your doctor before scheduling dental work or surgery while you are receiving this treatment. Talk to your doctor if you have recently had surgery or if you have a wound that has not healed.  Do not become pregnant while taking this medicine. Women should inform their doctor if they wish to become pregnant or think they might be pregnant. There is a potential for serious side effects to an unborn child. Talk to your health care professional or pharmacist for more information. Do not breast-feed an infant while taking this medicine.  This medicine has caused ovarian failure in some women. This medicine may interfere with the ability to have a child. You should talk to your doctor or health   care professional if you are concerned about your fertility.  What side effects may I notice from receiving this medicine?  Side effects that you should report to your doctor or health care professional as soon as possible:  -allergic reactions like skin rash, itching or hives, swelling of the face, lips, or tongue  -signs of infection - fever or chills, cough, sore throat, pain  or trouble passing urine  -signs of decreased platelets or bleeding - bruising, pinpoint red spots on the skin, black, tarry stools, nosebleeds, blood in the urine  -breathing problems  -changes in vision  -chest pain  -confusion  -jaw pain, especially after dental work  -mouth sores  -seizures  -severe abdominal pain  -severe headache  -sudden numbness or weakness of the face, arm or leg  -swelling of legs or ankles  -symptoms of a stroke: change in mental awareness, inability to talk or move one side of the body (especially in patients with lung cancer)  -trouble passing urine or change in the amount of urine  -trouble speaking or understanding  -trouble walking, dizziness, loss of balance or coordination  Side effects that usually do not require medical attention (report to your doctor or health care professional if they continue or are bothersome):  -constipation  -diarrhea  -dry skin  -headache  -loss of appetite  -nausea, vomiting  This list may not describe all possible side effects. Call your doctor for medical advice about side effects. You may report side effects to FDA at 1-800-FDA-1088.  Where should I keep my medicine?  This drug is given in a hospital or clinic and will not be stored at home.  NOTE: This sheet is a summary. It may not cover all possible information. If you have questions about this medicine, talk to your doctor, pharmacist, or health care provider.   2014, Elsevier/Gold Standard. (2010-01-02 16:25:37)

## 2013-06-12 NOTE — Telephone Encounter (Signed)
PT WANTED TO [facility] [place] TO MONDAYS-ADV NO APPT AVAIL ON MN W.LC-PT UNDERSTOOD & AGRD TO KEEP [facility]

## 2013-06-12 NOTE — Progress Notes (Signed)
Pt accessed via PAC, PAC flushing well with NS.Patient denies any discomfort. No s/s of distress noted.  No blood return noted at this time. Amy Horton, RN notified. Per Amy Horton,RN patient to be arrived to MD visit for further assessment.

## 2013-06-12 NOTE — Telephone Encounter (Signed)
per pof [facility] wantede to sch pt for visits w/her-sent email to advise no availabity/will send MW an email to sch chemo

## 2013-06-12 NOTE — Progress Notes (Signed)
Hematology and Oncology Follow Up Visit  [name] [name] [id number] 01/09/1946 68 y.o. 06/12/2013 10:11 AM     Principle Diagnosis:[name] [name] [place] 68 y.o. 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   Prior Therapy: #1. 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 Patient had 3 sentinel biopsied all of them were negative for metastatic disease. Patient underwent radiation therapy between 06/05/2009 through 07/02/2009. She was then begun on Tamoxifen 20 mg daily in 09/2009.   #2 In 12/2011 she received the diagnosis of gynecologic malignancy presenting with abdominal mass and pleural effusions. Patient was originally hospitalized with shortness of breath in 12/2011. 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 She also had malignant ascites. She underwent bilateral thoracentesis and paracentesis procedures during her hospitalization. During her hospitalization she was seen by gynecologic oncology.   #3 Patient began neoadjuvant chemotherapy consisting of Taxol and Carboplatinum. Her first cycle was administered during her hospitalization. She has completed a total of 6 cycles of Taxol/Carboplatinum from 01/07/2012 - 06/23/2012. Overall she tolerated it well except for some fatigue and neuropathies.   #4 Patient is status post laparotomy in 05/2012 that revealed significant residual disease.   #5 Patient began adjuvant therapy with Topotecan/Avastin starting on 07/24/2012.   Current therapy:  Topotecan given on day 1 day 8, and day 15 of a 21 day cycle and Avastin given every 21 days  Interim History: [name] [name] 68 y.o. female with ovarian cancer.  She is here for evaluation prior to Topotecan today.  She will receive cycle 11 day 15 today.  She is doing moderately well  today.  For the past week she has experienced nausea and diarrhea.  She was at the park and thinks she may have picked up a GI bug due to the fact that she hasn't experienced nausea and diarrhea like this with treatment before.  She was on Cipro x 10 days about one month ago.  She is taking Imodium for her diarrhea, and it is manageable this way.  Her shortness of breath remains intermittent and manageable.  It has improved.  Otherwise, she is doing well and a 10 point ROS is negative.    Medications:  Current Outpatient Prescriptions  Medication Sig Dispense Refill  . ALPRAZolam (XANAX) 0.25 MG tablet Take 1 tablet (0.25 mg total) by mouth every 8 (eight) hours as needed for anxiety.  30 tablet  4  . Alum & Mag Hydroxide-Simeth (MAGIC MOUTHWASH) SOLN Take 5 mLs by mouth 4 (four) times daily. Take 5 ml by mouth four times daily as needed.  240 mL  2  . aspirin 325 MG tablet Take 325 mg by mouth at bedtime.       [name] atorvastatin (LIPITOR) 20 MG tablet TAKE ONE TABLET BY MOUTH ONCE DAILY  90 tablet  3  . bisoprolol (ZEBETA) 10 MG tablet Take 1 tablet (10 mg total) by mouth daily.  30 tablet  6  . docusate sodium (COLACE) 100 MG capsule Take 200 mg by mouth daily.      [name] EPINEPHrine (EPIPEN 2-PAK) 0.3 mg/0.3 mL DEVI Inject 0.3 mg into the muscle daily as needed (allergic reaction).       . famotidine (PEPCID) 20 MG tablet Take 20 mg by mouth. Take 1 tablet at bedtime by mouth      .  fluticasone (FLONASE) 50 MCG/ACT nasal spray Place 2 sprays into the nose daily as needed for allergies.      [name] gabapentin (NEURONTIN) 100 MG capsule TAKE ONE CAPSULE  DAILY      . lidocaine-prilocaine (EMLA) cream Apply topically as needed.  30 g  1  . LORazepam (ATIVAN) 0.5 MG tablet Take 0.5 mg by mouth every 6 (six) hours as needed for anxiety (take one tab every 6 hours as needed for nausea and vomiting).      . nitroGLYCERIN (NITROSTAT) 0.4 MG SL tablet Place 0.4 mg under the tongue every 5 (five) minutes as needed.  Chest pain      . omeprazole (PRILOSEC) 20 MG capsule Take 40 mg by mouth daily.      . ondansetron (ZOFRAN) 8 MG tablet Take 1 tablet (8 mg total) by mouth 2 (two) times daily. Take two times a day starting the day after chemo for 2 days. Then take two times a day as needed for nausea or vomiting.  30 tablet  1  . oxybutynin (DITROPAN-XL) 10 MG 24 hr tablet Take 10 mg by mouth daily.      Vladimir Faster Glycol-Propyl Glycol 0.4-0.3 % SOLN Place 1 drop into both eyes every morning. As needed      . prochlorperazine (COMPAZINE) 25 MG suppository Place 1 suppository (25 mg total) rectally every 12 (twelve) hours as needed for nausea.  12 suppository  3  . senna (SENOKOT) 8.6 MG tablet Take 2 tablets by mouth at bedtime.       . SF 5000 PLUS 1.1 % CREA dental cream Place 1.1 % onto teeth at bedtime.       [name] trimethoprim (TRIMPEX) 100 MG tablet Take 100 mg by mouth every morning.       . valACYclovir (VALTREX) 500 MG tablet Take 1 tablet (500 mg total) by mouth daily.  30 tablet  7  . venlafaxine XR (EFFEXOR-XR) 75 MG 24 hr capsule TAKE ONE CAPSULE EACH DAY  30 capsule  4   No current facility-administered medications for this visit.   Facility-Administered Medications Ordered in Other Visits  Medication Dose Route Frequency Provider Last Rate Last Dose  . influenza  inactive virus vaccine (FLUZONE/FLUARIX) injection 0.5 mL  0.5 mL Intramuscular Once Rita Ohara, MD      . sodium chloride 0.9 % injection 10 mL  10 mL Intracatheter PRN Deatra Robinson, MD   10 mL at 02/28/13 1604  . sodium chloride 0.9 % injection 10 mL  10 mL Intravenous PRN Deatra Robinson, MD   10 mL at 05/29/13 1210  . sodium chloride 0.9 % injection 10 mL  10 mL Intravenous PRN Minette Headland, NP   10 mL at 06/12/13 9326     Allergies:  Allergies  Allergen Reactions  . Codeine Nausea And Vomiting       . Isosorbide Other (See Comments)    Extreme headaches  . Other Swelling    All Lip moisturizers except Vaseline.  .  Phenothiazines Other (See Comments)    Makes her stop breathing.  Loma Messing [Pentazocine] Nausea And Vomiting  . Yellow Jacket Venom Anaphylaxis  . Ambien [Zolpidem Tartrate]     Bad dreams  . Decadron [Dexamethasone]     "Caused vision problems"  . Tegaderm Ag Mesh [Silver] Rash    USE ONLY OPSITE TO PAC    Medical History: Past Medical History  Diagnosis Date  . Interstitial cystitis  on chronic antibiotics  . Hyperlipidemia   . Frequent UTI     on prophylaxis  . 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 CAD (coronary artery disease)     a. s/p CABG 7/13;   b.  LHC 12/01/11:  pLAD 70%, mLAD 40%, CFX 40-50% prior to takeoff of the OM2, oRCA occluded, mid vessel filled via R->R collaterals and distal vessel filled by L->R collaterals, S-OM1/OM2 (small and diffusely dz) with mid 90% stenosis, 90% at OM1 anastomotic site, continuation of OM2 occluded, S-Dx patent, S-PDA occluded, L-LAD ok, EF 55-65%  => Med Rx rec.  [name] Hx of echocardiogram     Echo 5/13: EF [id number], grade 2 diastolic dysfunction  . Hypercholesterolemia   . Pleural effusion 12/28/11    s/p pleurx catheter  . GERD (gastroesophageal reflux disease)   . Arthritis     "just a little; lower back" (12/28/11)  . Gout attack 08/2011    related to "stress post OHS"  . Depression   . Breast cancer     "left"; on Tamoxifen  . S/P thoracentesis 12/28/11    "for pleural effusion" (12/28/2011)  . Ovarian cancer   . Tachycardia   . Neuromuscular disorder     peripheral neuropathy  . Ovarian cancer 02/28/2013    Surgical History:  Past Surgical History  Procedure Laterality Date  . Breast lumpectomy  04/2009    left  . Cesarean section  1973; 1976  . Muscle release  1960    L neck and chest.; "when I was 13; pneumonia settled in my left neck"  . Coronary artery bypass graft  08/18/2011    Procedure: CORONARY ARTERY BYPASS GRAFTING (CABG);  Surgeon: Gaye Pollack, MD;  Location: [place];  Service: Open Heart Surgery;  Laterality:  N/A;  Coronary Artery Bypass Graft times five utilizing the left internal mammary artery and the left greater saphenous vein harvested endoscopically.  . Abdominal hysterectomy  1976  . Appendectomy  1976  . Portacath placement  01/06/2012    Procedure: INSERTION PORT-A-CATH;  Surgeon: Gaye Pollack, MD;  Location: [place];  Service: Thoracic;  Laterality: Left;  . Chest tube insertion  01/06/2012    Procedure: INSERTION PLEURAL DRAINAGE CATHETER;  Surgeon: Gaye Pollack, MD;  Location: [facility];  Service: Thoracic;  Laterality: Bilateral;  . Removal of pleural drainage catheter Right 04/12/2012    Procedure: MINOR REMOVAL OF PLEURAL DRAINAGE CATHETER;  Surgeon: Gaye Pollack, MD;  Location: [place];  Service: Thoracic;  Laterality: Right;  . Talc pleurodesis Left 04/12/2012    Procedure: Pietro Cassis;  Surgeon: Gaye Pollack, MD;  Location: [place];  Service: Thoracic;  Laterality: Left;  . Portacath placement Left 04/17/2012    Procedure: INSERTION PORT-A-CATH;  Surgeon: Gaye Pollack, MD;  Location: [facility];  Service: Thoracic;  Laterality: Left;  . Removal of pleural drainage catheter Left 04/17/2012    Procedure: REMOVAL OF PLEURAL DRAINAGE CATHETER;  Surgeon: Gaye Pollack, MD;  Location: [place];  Service: Thoracic;  Laterality: Left;  . Port-a-cath removal Left 04/17/2012    Procedure: REMOVAL PORT-A-CATH;  Surgeon: Gaye Pollack, MD;  Location: [facility];  Service: Thoracic;  Laterality: Left;  . Cardiac catheterization    . Laparotomy Bilateral 05/30/2012    Procedure: Resection of umbilical mass, Partial omentectomy;  Surgeon: Alvino Chapel, MD;  Location: [facility];  Service: Gynecology;  Laterality: Bilateral;     Review of Systems: A 10 point review  of systems was conducted and is otherwise negative except for what is noted above.     Physical Exam: Blood pressure 155/79, pulse 64, temperature 97.9 F (36.6 C), temperature source Oral, resp. rate 20, height 5\' 7"  (1.702 m), weight  150 lb 3.2 oz (68.13 kg). GENERAL: Patient is a well appearing female in no acute distress HEENT:  Sclerae anicteric.  Oropharynx clear and moist. No ulcerations or evidence of oropharyngeal candidiasis. Neck is supple.  NODES:  No cervical, supraclavicular, or axillary lymphadenopathy palpated.  BREAST EXAM:  Deferred. LUNGS:  Clear to auscultation bilaterally.  No wheezes or rhonchi. HEART:  Regular rate and rhythm. No murmur appreciated. ABDOMEN:  Soft, nontender.  Positive, normoactive bowel sounds. No organomegaly palpated. MSK:  No focal spinal tenderness to palpation. Full range of motion bilaterally in the upper extremities. EXTREMITIES:  No peripheral edema.   SKIN:  Clear with no obvious rashes or skin changes. No nail dyscrasia. NEURO:  Nonfocal. Well oriented.  Appropriate affect. ECOG PERFORMANCE STATUS: 1 - Symptomatic but completely ambulatory   Lab Results: Lab Results  Component Value Date   WBC 5.5 06/07/2013   HGB 10.2* 06/07/2013   HCT 32.9* 06/07/2013   MCV 95.8 06/07/2013   PLT 259 06/07/2013     Chemistry      Component Value Date/Time   NA 143 06/07/2013 1005   NA 134* 06/02/2012 0515   NA 141 12/17/2011 1144   K 3.9 06/07/2013 1005   K 4.4 06/02/2012 0515   CL 103 08/07/2012 1108   CL 100 06/02/2012 0515   CO2 22 06/07/2013 1005   CO2 25 06/02/2012 0515   BUN 21.3 06/07/2013 1005   BUN 20 06/02/2012 0515   BUN 15 12/17/2011 1144   CREATININE 0.8 06/07/2013 1005   CREATININE 0.76 06/02/2012 0515      Component Value Date/Time   CALCIUM 9.7 06/07/2013 1005   CALCIUM 8.7 06/02/2012 0515   ALKPHOS 95 06/07/2013 1005   ALKPHOS 85 05/26/2012 0955   AST 28 06/07/2013 1005   AST 22 05/26/2012 0955   ALT 38 06/07/2013 1005   ALT 16 05/26/2012 0955   BILITOT 0.48 06/07/2013 1005   BILITOT 0.3 05/26/2012 0955      Assessment and Plan: [name] [name] 68 y.o. female with  1. Ovarian cancer.  Please see prior history above.  She is here to receive Topotecan cycle 11  day 15 and Avastin therapy.  She is doing well and will proceed with this today.  Her labs are pending.  She will proceed with therapy today once they have resulted.    2. H/o DCIS of the left breast s/p lumpectomy in March, 2011. She underwent adjuvant radiation therapy, and following the completion of radiation was started on Tamoxifen in August, 2011. Tamoxifen was discontinued due to her diagnosis of ovarian cancer.  3. Shortness of breath. This is stable. She was evaluated by GI and put on a PPI in addition to diet changes. She also had her cardiac medications changed by cardiology which also helped. This is controlled this week.  For now, we will continue to monitor.   4. Steroid induced cataracts. Patient underwent left eye cataract extraction on 05/09/13 by Dr. Caroleen Hamman at [facility] surgery. The healing of the left eye has taken slightly longer than all of [place] had anticipated. She is scheduled to receive glasses next week.  Due to this she has decided to forego an extraction of the right eye until  a later time.   5. Neuropathy.  This is stable.  She will continue Gabapentin.   6. Nausea and diarrhea.  This is possibly a GI bug.  I discussed her symptoms with her, and she will monitor her output.  If it worsens, I will have her return for stool studies and further work up.    The patient will return in two weeks for labs, evaluation, and cycle 12 day 1 of Topotecan.   She knows to call us in the interim for any questions or concerns.  We can certainly see her sooner if needed.  I spent 25 minutes counseling the patient face to face.  The total time spent in the appointment was 30 minutes.  Minette Headland, [place] [phone] 06/12/2013 10:11 AM  Addendum:  [name] platlet count was 63K today.  Due to this she will not receive treatment.  She will f/u with Dr. Marko Plume in one week.

## 2013-06-12 NOTE — Patient Instructions (Addendum)
Today your platelets were 63 so no chemo treatment today.

## 2013-06-12 NOTE — Patient Instructions (Signed)

## 2013-06-13 ENCOUNTER — Telehealth: Payer: Self-pay | Admitting: Oncology

## 2013-06-13 ENCOUNTER — Telehealth: Payer: Self-pay

## 2013-06-13 LAB — CA 125: CA 125: 137.8 U/mL — AB (ref 0.0–30.2)

## 2013-06-13 NOTE — Telephone Encounter (Signed)
Left a message for [name] [name] stating that she will see Dr. Marko Plume on 06-21-13 as scheduled and Dr. Marko Plume will determine at that visit when her next treatment will be as her plts  were low yesterday at 67 K.  It may take ~ for them to recover. She can call back to Dr. Mariana Kaufman office if she has any further questions.

## 2013-06-13 NOTE — Telephone Encounter (Signed)
, °

## 2013-06-14 ENCOUNTER — Telehealth: Payer: Self-pay | Admitting: *Deleted

## 2013-06-14 NOTE — Telephone Encounter (Signed)
Per staff message and POF I have scheduled appts.  JMW  

## 2013-06-17 ENCOUNTER — Other Ambulatory Visit: Payer: Self-pay | Admitting: Oncology

## 2013-06-21 ENCOUNTER — Encounter: Payer: Self-pay | Admitting: Oncology

## 2013-06-21 ENCOUNTER — Ambulatory Visit (HOSPITAL_BASED_OUTPATIENT_CLINIC_OR_DEPARTMENT_OTHER): Payer: Medicare Other | Admitting: Oncology

## 2013-06-21 ENCOUNTER — Other Ambulatory Visit (HOSPITAL_BASED_OUTPATIENT_CLINIC_OR_DEPARTMENT_OTHER): Payer: Medicare Other

## 2013-06-21 VITALS — BP 132/77 | HR 76 | Temp 98.3°F | Ht 67.0 in | Wt 150.6 lb

## 2013-06-21 DIAGNOSIS — C569 Malignant neoplasm of unspecified ovary: Secondary | ICD-10-CM

## 2013-06-21 DIAGNOSIS — Z853 Personal history of malignant neoplasm of breast: Secondary | ICD-10-CM

## 2013-06-21 DIAGNOSIS — J910 Malignant pleural effusion: Secondary | ICD-10-CM

## 2013-06-21 DIAGNOSIS — C801 Malignant (primary) neoplasm, unspecified: Secondary | ICD-10-CM

## 2013-06-21 DIAGNOSIS — H268 Other specified cataract: Secondary | ICD-10-CM

## 2013-06-21 LAB — CBC WITH DIFFERENTIAL/PLATELET
BASO%: 0.5 % (ref 0.0–2.0)
Basophils Absolute: 0 10*3/uL (ref 0.0–0.1)
EOS ABS: 0.1 10*3/uL (ref 0.0–0.5)
EOS%: 2.6 % (ref 0.0–7.0)
HCT: 34.7 % — ABNORMAL LOW (ref 34.8–46.6)
HGB: 10.5 g/dL — ABNORMAL LOW (ref 11.6–15.9)
LYMPH%: 18.6 % (ref 14.0–49.7)
MCH: 29.7 pg (ref 25.1–34.0)
MCHC: 30.3 g/dL — ABNORMAL LOW (ref 31.5–36.0)
MCV: 98.3 fL (ref 79.5–101.0)
MONO#: 0.6 10*3/uL (ref 0.1–0.9)
MONO%: 15.7 % — ABNORMAL HIGH (ref 0.0–14.0)
NEUT%: 62.6 % (ref 38.4–76.8)
NEUTROS ABS: 2.4 10*3/uL (ref 1.5–6.5)
NRBC: 1 % — AB (ref 0–0)
PLATELETS: 361 10*3/uL (ref 145–400)
RBC: 3.53 10*6/uL — ABNORMAL LOW (ref 3.70–5.45)
RDW: 21.1 % — ABNORMAL HIGH (ref 11.2–14.5)
WBC: 3.9 10*3/uL (ref 3.9–10.3)
lymph#: 0.7 10*3/uL — ABNORMAL LOW (ref 0.9–3.3)

## 2013-06-21 LAB — COMPREHENSIVE METABOLIC PANEL (CC13)
ALBUMIN: 3.4 g/dL — AB (ref 3.5–5.0)
ALT: 24 U/L (ref 0–55)
ANION GAP: 13 meq/L — AB (ref 3–11)
AST: 26 U/L (ref 5–34)
Alkaline Phosphatase: 110 U/L (ref 40–150)
BUN: 15.8 mg/dL (ref 7.0–26.0)
CALCIUM: 9.8 mg/dL (ref 8.4–10.4)
CHLORIDE: 110 meq/L — AB (ref 98–109)
CO2: 21 meq/L — AB (ref 22–29)
CREATININE: 0.8 mg/dL (ref 0.6–1.1)
Glucose: 110 mg/dl (ref 70–140)
POTASSIUM: 4.4 meq/L (ref 3.5–5.1)
Sodium: 145 mEq/L (ref 136–145)
Total Bilirubin: 0.46 mg/dL (ref 0.20–1.20)
Total Protein: 7.1 g/dL (ref 6.4–8.3)

## 2013-06-21 NOTE — Progress Notes (Signed)
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 Treatment was held on 06-06-13 due to low platelets; CA 125 on 06-12-13 was more elevated at 137, this having been 68 in Jan 2015. Last CT AP was 03-08-13, still with bulky disease but no clear progression then despite slight increase in CA 125 at that time. Last PET was 11-2012. She saw Dr Alycia Rossetti in 04-2013, with recommendation to continue the topotecan avastin as long as tolerating unless more clear progression.  Last Avastin was 06-04-13. She would like to have the second cataract removed as she has very poor vision on that side now, and will need to be off Avastin x 4 weeks prior. Fortunately vision OS is good now since cataract extraction on that side 05-09-13. Patient otherwise does not feel badly, tho she does fatigue easily. No bleeding while platelets were low. She can tell better strength in quadriceps since decrease in steroids, now able to get up out of chairs and easier to do gardening. Upper abdomen is sore bilaterally if she sneezes or coughs, otherwise no abdominal or pelvic discomfort. Bowels are moving regularly with senokot and colace, appetite is adequate, no significant bleeding, bladder ok, no LE swelling, no new or different pain.  She has PAC, which does not draw easily at times.  She also has history of left DCIS treated with tamoxifen; no known active breast cancer now. She has CAD post CABG, follows with Dr Acie Fredrickson every 6 months.  I do not locate genetics testing in EMR, tho that may have been done; I did not discuss with her today. Per EMR, 1 child and brother   She has had rescue poodle named Iona Beard for 6-7 years "he  follows me everywhere". She loves to read, which she was not able to do prior to recent eye surgery.  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 Patient had 3 sentinel biopsied all of them were negative for metastatic disease. Patient underwent radiation therapy between 06/05/2009 through 07/02/2009. She was then begun on Tamoxifen 20 mg daily in 09/2009.  #2 In 12/2011 she received the diagnosis of gynecologic malignancy presenting with abdominal mass and pleural effusions. Patient was originally hospitalized with shortness of breath in 12/2011. 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 She also had malignant ascites. She underwent bilateral thoracentesis and paracentesis procedures during her hospitalization. During her hospitalization she was seen by gynecologic oncology.  #3 Patient began neoadjuvant chemotherapy consisting of Taxol and Carboplatinum. Her first cycle was administered during her hospitalization. She completed 6  cycles of Taxol/Carboplatinum from 01/07/2012 - 06/23/2012. Per patient "very sick with initial chemo and it did not work"  #4. laparotomy in 05/2012 that revealed significant residual disease.  #5 Patient began adjuvant therapy with Topotecan/Avastin starting on  07/24/2012, given thru 06-04-2013.   Review of systems as above, also: Mouth is extremely dry, which preceded the cancer diagnosis and managed this by drinking water. No increased SOB or cough. No chest pain or other cardiac symptoms. Remainder of 10 point Review of Systems negative.  Objective:  Vital signs in last 24 hours:  BP 132/77  Pulse 76  Temp(Src) 98.3 F (36.8 C) (Oral)  Ht 5\' 7"  (1.702 m)  Wt 150 lb 9.6 oz (68.312 kg)  BMI 23.58 kg/m2  SpO2 94%  Alert, oriented and appropriate. Ambulatory without difficulty. Respirations not labored RA Partial alopecia  HEENT:PERRL, sclerae not icteric or injected. No periorbital swelling OS now. Oral mucosa dry, without lesions, posterior pharynx clear.  Neck supple. No JVD.  Lymphatics:no cervical,suraclavicular, axillary or inguinal adenopathy Resp: breath sounds somewhat diminished in lower fields bilaterally but no wheezes, rales, rubs. Cardio: regular rate and rhythm. No gallop. GI: soft, nontender, somewhat full thruout but not markedly distended, no appreciable mass or organomegaly. Normally active bowel sounds. Surgical incision not remarkable. Musculoskeletal/ Extremities: without pitting edema, cords, tenderness Neuro: speech fluent, CN, motor, cerebellar, sensory grossly nonfocal Skin without rash, ecchymosis, petechiae. Portacath-without erythema or tenderness  Lab Results:  Results for orders placed in visit on 06/21/13  CBC WITH DIFFERENTIAL      Result Value Ref Range   WBC 3.9  3.9 - 10.3 10e3/uL   NEUT# 2.4  1.5 - 6.5 10e3/uL   HGB 10.5 (*) 11.6 - 15.9 g/dL   HCT 34.7 (*) 34.8 - 46.6 %   Platelets 361  145 - 400 10e3/uL   MCV 98.3  79.5 - 101.0 fL   MCH 29.7  25.1 - 34.0 pg   MCHC 30.3 (*) 31.5 - 36.0 g/dL   RBC 3.53 (*) 3.70 - 5.45 10e6/uL   RDW 21.1 (*) 11.2 - 14.5 %   lymph# 0.7 (*) 0.9 - 3.3 10e3/uL   MONO# 0.6  0.1 - 0.9 10e3/uL   Eosinophils Absolute 0.1  0.0 - 0.5 10e3/uL   Basophils Absolute 0.0   0.0 - 0.1 10e3/uL   NEUT% 62.6  38.4 - 76.8 %   LYMPH% 18.6  14.0 - 49.7 %   MONO% 15.7 (*) 0.0 - 14.0 %   EOS% 2.6  0.0 - 7.0 %   BASO% 0.5  0.0 - 2.0 %   nRBC 1 (*) 0 - 0 %  COMPREHENSIVE METABOLIC PANEL (AT55)      Result Value Ref Range   Sodium 145  136 - 145 mEq/L   Potassium 4.4  3.5 - 5.1 mEq/L   Chloride 110 (*) 98 - 109 mEq/L   CO2 21 (*) 22 - 29 mEq/L   Glucose 110  70 - 140 mg/dl   BUN 15.8  7.0 - 26.0 mg/dL   Creatinine 0.8  0.6 - 1.1 mg/dL   Total Bilirubin 0.46  0.20 - 1.20 mg/dL   Alkaline Phosphatase 110  40 - 150 U/L   AST 26  5 - 34 U/L   ALT 24  0 - 55 U/L   Total Protein 7.1  6.4 - 8.3 g/dL   Albumin 3.4 (*) 3.5 - 5.0 g/dL   Calcium 9.8  8.4 - 10.4 mg/dL   Anion Gap  13 (*) 3 - 11 mEq/L    CA 125 fro 06-12-13  137, compared with 68 in Jan 2015. Studies/Results: Previous path from cytology pleural fluid 12-2011 and gyn surgery 05-2012 located and reviewed in EMR by this MD now: see below.  Medications: I have reviewed the patient's current medications. Ditropan noted after visit, possible cause of xerostomia and I will try to get more history in this regard.  DISCUSSION: We have discussed increase in CA 125, which may well be indication of some early progression, particularly as she has been on this regimen for 9 months. She does not seem symptomatic yet. She and husband agree with recommendation for repeat CT CAP, then will see me back to discuss treatment options. She emphasizes that quality of life is a priority. She and husband are aware that I will let Dr Alycia Rossetti know situation and will be glad for any recommendations from her also. In meantime, patient would like to take advantage of being off avastin already almost 3 weeks to get the other cataract surgery. Dr Lucita Ferrara has told her that he will be glad to work her in for that surgery when other treatments allow, and she will contact his office probably today.  I did not discuss other chemotherapy options,  as we will do this after scans. Note taxol and carboplatin q 3 weeks (taxol at 175 g/m2) required neulasta (given day 2) and patient has told me that treatment was extremely difficult for her. She is not appropriate for doxil given cardiac disease. Options for chemo may included taxotere, abraxane, gemzar/ CDDP, oral etoposide, weekly taxol, single agent carbo (or Alimta). Surgical path [id number] from [id number] not mention ER PR testing. Cytology from malignant pleural effusions [id number] and [id number] from 12-2011 does not give result of ER tho this stain is included in the immunohistochemistry panel.  Likely additional chemotherapy will be most appropriate now, however may need to request review by pathology for ER information at some point.  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 Last scans in Jan 2015 still with bulky disease, marker now further increased. Will repeat CT CAP and likely will need to change treatment, which is in palliative attempt. Note quality of life is priority for her. Not clear to me if genetics testing has been done. I will let Dr Alycia Rossetti know situation.  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 Hopefully she will be able to get right done in this short treatment break now, needs to be at least 4 weeks off avastin ( so May 18 on ok) 3.CAD post 5 vessel CABG 08-2011  4.history left DCIS 04-2009, post lumpectomy, RT and 2 years tamoxifen which was [facility] with gyn cancer diagnosis  5.post remote hysterectomy  6. 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 They know that I am glad to speak with Dr Lucita Ferrara if needed.  Time spent 40 min including >50% counseling,  coordination of care and review of additional records by this MD as I have recently assumed her care.  Perlie Stene P  Marko Plume, MD   06/21/2013, 9:50 PM

## 2013-06-22 ENCOUNTER — Telehealth: Payer: Self-pay | Admitting: Oncology

## 2013-06-22 NOTE — Telephone Encounter (Signed)
per pof to cancel appts 5/11 & RX for 5/11& 5/18-cld & spoke w/pt to adv of new sch-pt understood

## 2013-06-25 ENCOUNTER — Ambulatory Visit: Payer: Medicare Other

## 2013-06-25 ENCOUNTER — Ambulatory Visit: Payer: Medicare Other | Admitting: Oncology

## 2013-06-25 ENCOUNTER — Other Ambulatory Visit: Payer: Medicare Other

## 2013-06-26 ENCOUNTER — Ambulatory Visit: Payer: Medicare Other | Admitting: Adult Health

## 2013-06-26 ENCOUNTER — Ambulatory Visit: Payer: Medicare Other

## 2013-06-26 ENCOUNTER — Other Ambulatory Visit: Payer: Medicare Other

## 2013-06-27 ENCOUNTER — Ambulatory Visit (HOSPITAL_COMMUNITY)
Admission: RE | Admit: 2013-06-27 | Discharge: 2013-06-27 | Disposition: A | Discharge status: Home / Self Care | Payer: Medicare Other | Source: Ambulatory Visit | Attending: Oncology | Admitting: Oncology

## 2013-06-27 DIAGNOSIS — Z9010 Acquired absence of unspecified breast and nipple: Secondary | ICD-10-CM | POA: Insufficient documentation

## 2013-06-27 DIAGNOSIS — C786 Secondary malignant neoplasm of retroperitoneum and peritoneum: Secondary | ICD-10-CM | POA: Insufficient documentation

## 2013-06-27 DIAGNOSIS — Z9221 Personal history of antineoplastic chemotherapy: Secondary | ICD-10-CM | POA: Insufficient documentation

## 2013-06-27 DIAGNOSIS — C569 Malignant neoplasm of unspecified ovary: Secondary | ICD-10-CM | POA: Insufficient documentation

## 2013-06-27 DIAGNOSIS — Z923 Personal history of irradiation: Secondary | ICD-10-CM | POA: Insufficient documentation

## 2013-06-27 DIAGNOSIS — Z853 Personal history of malignant neoplasm of breast: Secondary | ICD-10-CM | POA: Insufficient documentation

## 2013-06-27 MED ORDER — IOHEXOL 300 MG/ML  SOLN
100.0000 mL | Freq: Once | INTRAMUSCULAR | Status: AC | PRN
  Administered 2013-06-27: 100 mL via INTRAVENOUS

## 2013-06-29 ENCOUNTER — Telehealth: Payer: Self-pay | Admitting: *Deleted

## 2013-06-29 NOTE — Telephone Encounter (Signed)
   Provider input needed: FYI    Reason for call: Possible URI  Ears, nose, mouth, throat, and face: positive for hoarseness, voice change and rhinorrhea Respiratory: positive for cough and dyspnea on exertion (baseline)  ALLERGIES:  is allergic to codeine; isosorbide; other; phenothiazines; talwin; yellow jacket venom; ambien; decadron; and tegaderm ag mesh.  Patient last received chemotherapy/ treatment on 06/04/13  Patient was last seen in the office on 06/21/13   Next appt is 07/02/13   Is patient having fevers greater than 100.5?  no   Is patient having uncontrolled pain, or new pain? no   Is patient having new back pain that changes with position (worsens or eases when laying down?)  no   Is patient able to eat and drink? yes    Is patient able to pass stool without difficulty?   yes     Is patient having uncontrolled nausea?  no    patient calls 06/29/2013 with complaint of  2 day history of cough-nonproductive, voice hoarse, runny nose. her dyspnea is at baseline. No fever. Has been taking Tussin DM prn   Summary Based on the above information advised patient to  Continue Tussin DM, push po fluids, gargle with salt water/baking soda, use throat lozenges, try to rest her voice as much as possible, monitor temp (noted she is not neutropenic). Try OTC Benadryl or Claritin to attempt to decrease inflammatory process/allergy symptoms. She should go to Urgent Care over the weekend if she develops a high fever or her dyspnea progresses or if she develops any significant chest pain. Follow up with Dr. Marko Plume on 5/18 as scheduled.   [name] [name]  06/29/2013, 9:34 AM   Background Info  [name] [name]   DOB: [birth year]   MR#: [id number]   CSN#   [id number] 06/29/2013

## 2013-07-01 ENCOUNTER — Other Ambulatory Visit: Payer: Self-pay | Admitting: Oncology

## 2013-07-02 ENCOUNTER — Ambulatory Visit: Payer: Medicare Other

## 2013-07-02 ENCOUNTER — Other Ambulatory Visit (HOSPITAL_BASED_OUTPATIENT_CLINIC_OR_DEPARTMENT_OTHER): Payer: Medicare Other

## 2013-07-02 ENCOUNTER — Other Ambulatory Visit: Payer: Self-pay | Admitting: *Deleted

## 2013-07-02 ENCOUNTER — Ambulatory Visit (HOSPITAL_BASED_OUTPATIENT_CLINIC_OR_DEPARTMENT_OTHER): Payer: Medicare Other | Admitting: Oncology

## 2013-07-02 ENCOUNTER — Encounter: Payer: Self-pay | Admitting: Oncology

## 2013-07-02 ENCOUNTER — Telehealth: Payer: Self-pay | Admitting: Oncology

## 2013-07-02 VITALS — BP 144/81 | HR 80 | Temp 98.3°F | Resp 17 | Ht 67.0 in | Wt 150.1 lb

## 2013-07-02 DIAGNOSIS — C50919 Malignant neoplasm of unspecified site of unspecified female breast: Secondary | ICD-10-CM

## 2013-07-02 DIAGNOSIS — C779 Secondary and unspecified malignant neoplasm of lymph node, unspecified: Secondary | ICD-10-CM

## 2013-07-02 DIAGNOSIS — R05 Cough: Secondary | ICD-10-CM

## 2013-07-02 DIAGNOSIS — J910 Malignant pleural effusion: Secondary | ICD-10-CM

## 2013-07-02 DIAGNOSIS — C801 Malignant (primary) neoplasm, unspecified: Secondary | ICD-10-CM

## 2013-07-02 DIAGNOSIS — C569 Malignant neoplasm of unspecified ovary: Secondary | ICD-10-CM

## 2013-07-02 DIAGNOSIS — R059 Cough, unspecified: Secondary | ICD-10-CM

## 2013-07-02 LAB — CBC WITH DIFFERENTIAL/PLATELET
BASO%: 0.4 % (ref 0.0–2.0)
Basophils Absolute: 0 10*3/uL (ref 0.0–0.1)
EOS%: 1.6 % (ref 0.0–7.0)
Eosinophils Absolute: 0.2 10*3/uL (ref 0.0–0.5)
HCT: 36.3 % (ref 34.8–46.6)
HGB: 11.3 g/dL — ABNORMAL LOW (ref 11.6–15.9)
LYMPH#: 0.7 10*3/uL — AB (ref 0.9–3.3)
LYMPH%: 7.6 % — AB (ref 14.0–49.7)
MCH: 29.6 pg (ref 25.1–34.0)
MCHC: 31 g/dL — AB (ref 31.5–36.0)
MCV: 95.3 fL (ref 79.5–101.0)
MONO#: 0.7 10*3/uL (ref 0.1–0.9)
MONO%: 7.8 % (ref 0.0–14.0)
NEUT#: 7.8 10*3/uL — ABNORMAL HIGH (ref 1.5–6.5)
NEUT%: 82.6 % — AB (ref 38.4–76.8)
PLATELETS: 334 10*3/uL (ref 145–400)
RBC: 3.81 10*6/uL (ref 3.70–5.45)
RDW: 21.5 % — ABNORMAL HIGH (ref 11.2–14.5)
WBC: 9.5 10*3/uL (ref 3.9–10.3)

## 2013-07-02 MED ORDER — BENZONATATE 100 MG PO CAPS: 100.0000 mg | ORAL_CAPSULE | Freq: Three times a day (TID) | ORAL | Status: DC | PRN

## 2013-07-02 MED ORDER — AZITHROMYCIN 250 MG PO TABS: ORAL_TABLET | ORAL | Status: DC

## 2013-07-02 NOTE — Telephone Encounter (Signed)
gv pt appt schedule for may/june °

## 2013-07-02 NOTE — Progress Notes (Signed)
OFFICE PROGRESS NOTE   07/02/2013   Physicians:E.Knapp (PCP), Paola Gehrig, Matthew Wakefield, James Kinard, Philip Nahser, (B.Bartle), Mike Wert, Karl Stonecipher   INTERVAL HISTORY:  Patient is seen, together with husband, now having had restaging scans which do confirm progression of advanced gyn carcinoma. Fortunately she does not notice increased symptoms yet, and has been able to take advantage of treatment break to get second cataract surgery set up by Dr Stonecipher for 07-04-13. I discussed situation including new CTs and treatment options with Dr Gehrig prior to visit today . New problem for past 10 days (preceding CT CAP done 06-27-13) is NP cough, not associated with fever, SOB or esophagitis symptoms, and now laryngitis for past ~ 2 days. She has been using Robitussin and OTC cough drops. She is intolerant to codeine and hydrocodone with nausea.  She has PAC.   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 Patient had 3 sentinel biopsied all of them were negative for metastatic disease. Patient underwent radiation therapy between 06/05/2009 through 07/02/2009. She was then begun on Tamoxifen 20 mg daily in 09/2009.  #2 In 12/2011 she received the diagnosis of gynecologic malignancy presenting with abdominal mass and pleural effusions. Patient was  originally hospitalized with shortness of breath in 12/2011. 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 She also had malignant ascites. She underwent bilateral thoracentesis and paracentesis procedures during her hospitalization. During her hospitalization she was seen by gynecologic oncology.  #3 Patient began neoadjuvant chemotherapy consisting of Taxol and Carboplatinum. Her first cycle was administered during her hospitalization. She completed 6 cycles of Taxol/Carboplatinum from 01/07/2012 - 06/23/2012. Per patient "very sick with initial chemo and it did not work"  #4. laparotomy in 05/2012 that revealed significant residual disease.  #5 Patient began adjuvant therapy with Topotecan/Avastin starting on 07/24/2012, given thru 06-04-2013. Progression by CA125 marker and CT May 2015.   Review of systems as above, also: Fatigued due to sleeping poorly with cough. Cough worse when lies supine, better when propped on pillows. No new or different pain. Bowels ok. No bleeding. No chest pain. No swelling LE. Vision OS excellent now post recent surgery on that side and new lenses. Remainder of 10 point Review of Systems negative.  Objective:  Vital signs in last 24 hours:  BP 144/81  Pulse 80  Temp(Src) 98.3 F (36.8 C) (Oral)  Resp 17  Ht 5' 7" (1.702 m)  Wt 150 lb 1.6 oz (68.085 kg)  BMI 23.50 kg/m2  SpO2 94%  Alert, oriented and appropriate. Ambulatory without difficulty. Minimal dry sounding cough. Respirations not labored RA. Cannot speak above whisper. Partial alopecia  HEENT:PERRL, sclerae not icteric. Oral mucosa moist without lesions, posterior pharynx clear.  Neck supple. No JVD.  Lymphatics:no cervical,suraclavicular, axillary or inguinal adenopathy Resp: clear to auscultation bilaterally and normal   percussion bilaterally Cardio: regular rate and rhythm. No gallop. GI: soft, nontender somewhat full  thruout but no increased distension, no mass or organomegaly. Normally active bowel sounds. Surgical incision not remarkable. No umbilicus (which concerns young grandchild) Musculoskeletal/ Extremities: without pitting edema, cords, tenderness Neuro: no peripheral neuropathy. Otherwise nonfocal. PSYCH appropriate mood and affect Skin without rash, ecchymosis, petechiae Portacath-without erythema or tenderness  Lab Results:  Results for orders placed in visit on 07/02/13  CBC WITH DIFFERENTIAL      Result Value Ref Range   WBC 9.5  3.9 - 10.3 10e3/uL   NEUT# 7.8 (*) 1.5 - 6.5 10e3/uL   HGB 11.3 (*) 11.6 - 15.9 g/dL   HCT 36.3  34.8 - 46.6 %   Platelets 334  145 - 400 10e3/uL   MCV 95.3  79.5 - 101.0 fL   MCH 29.6  25.1 - 34.0 pg   MCHC 31.0 (*) 31.5 - 36.0 g/dL   RBC 3.81  3.70 - 5.45 10e6/uL   RDW 21.5 (*) 11.2 - 14.5 %   lymph# 0.7 (*) 0.9 - 3.3 10e3/uL   MONO# 0.7  0.1 - 0.9 10e3/uL   Eosinophils Absolute 0.2  0.0 - 0.5 10e3/uL   Basophils Absolute 0.0  0.0 - 0.1 10e3/uL   NEUT% 82.6 (*) 38.4 - 76.8 %   LYMPH% 7.6 (*) 14.0 - 49.7 %   MONO% 7.8  0.0 - 14.0 %   EOS% 1.6  0.0 - 7.0 %   BASO% 0.4  0.0 - 2.0 %    CA 125 today 131, this having been 137 on 4-28 and 68 in Jan 2015.  Studies/Results: CT CAP WITH CONTRAST  06-21-13 CT CHEST FINDINGS  Mediastinum: Left subclavian single-lumen porta cath with tip  terminating in the mid superior vena cava. Notably, there is some  nonenhancing material which appears adherent to the distal aspect of  the catheter in the superior vena cava, compatible with catheter  associated thrombus. Heart size is normal. There is no significant  pericardial fluid, thickening or pericardial calcification. There is  atherosclerosis of the thoracic aorta, the great vessels of the  mediastinum and the coronary arteries, including calcified  atherosclerotic plaque in the left main, left anterior descending,  left circumflex and right coronary arteries.  Status post median  sternotomy for CABG, including LIMA to the LAD. 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 This is similar in  retrospect compared to recent prior examinations. No pathologically  enlarged mediastinal or hilar lymph nodes. Esophagus is unremarkable  in appearance.  Lungs/Pleura: Pleural based areas of architectural distortion and  calcification in the base of the left hemithorax appear unchanged  compared to prior examinations, presumably related to prior  infection and/or pleural hemorrhage. Mild scarring in the inferior  segment of the lingula. Small amount of subpleural reticulation in  the anterior aspect of the left upper lobe immediately deep to the  site of prior left-sided lumpectomy, presumably related to prior  breast radiation therapy. Small chronic right-sided pleural effusion  is unchanged. No acute consolidative airspace disease. No definite  suspicious appearing pulmonary nodules or masses.  Musculoskeletal: Median sternotomy wires. There are no aggressive  appearing lytic or blastic lesions noted in the visualized portions  of the skeleton.  CT ABDOMEN AND PELVIS FINDINGS  Abdomen/Pelvis: Nodularity along the surface of  the liver is again  noted, compatible with multifocal serosal implants. Multiple small  implants are also noted inferior to the gallbladder fossa  immediately anterior to the hepatic flexure of the colon.  Ill-defined low-attenuation lesion in segment 6 of the liver (image  59 of series 2) measures 9 mm and is too small to characterize.  There is also ill-defined area of low attenuation adjacent to the  falciform ligament, which is increasingly conspicuous compared to  prior examinations, potentially an area of focal fatty infiltration,  although a metastatic lesion in this  region is difficult to exclude.  The appearance of the gallbladder, pancreas, spleen, bilateral  adrenal glands and bilateral kidneys is unremarkable.  Again noted is a large burden of abnormal soft tissue scattered  throughout the mesentery, compatible with widespread metastatic  disease. The overall burden of the soft tissue appears increased  compared to the prior examination, although secondary to the  irregular shape of these lesions, exact measurement comparison is  challenging. The largest focal soft tissue is in the left side of  the abdomen is demonstrated on image 84 of series 2, estimated to  measure at least 10.5 x 3.9 cm. There are some internal cystic areas  within these mesenteric deposits, largest of which is also noted on  image 84 of series 2 measuring up to 5.4 x 10.8 cm. Enlarging  multilocular cystic appearing mass in the right adnexal region which  currently measures 6.7 x 3.1 cm (image 110 of series 2). Marked  thickening of the wall of the sigmoid colon may be related to  diffuse infiltration from adherent tumor, or could be a  manifestation of prior radiation therapy to the pelvis. Multiple  borderline and mildly enlarged retroperitoneal lymph nodes are  noted, measuring up to 1 cm in short axis adjacent to the origin of  the superior mesenteric artery on the left side. Status post  hysterectomy. Urinary bladder is unremarkable in appearance.  Musculoskeletal: There are no aggressive appearing lytic or blastic  lesions noted in the visualized portions of the skeleton.  IMPRESSION:  1. Today's study demonstrates progression of widespread peritoneal  metastatic disease, as discussed above.  2. No definite signs of metastatic disease to the thorax at this  time.  3. New ill-defined area of low attenuation in the liver is favored  to represent a focal area fatty infiltration, however, the  possibility of a new metastatic lesion in the liver is not excluded.  This  could simply represent a serosal implant which has invaginated  along the course of the falciform ligament.  4. Catheter associated thrombus along the distal aspect of the left  subclavian Port-A-Cath is similar to the prior examination.  5. Additional incidental findings, as above.  CT report and PACS images reviewed by MD.  CT information reviewed with patient and husband now; they declined offer to review images in PACS.   Medications: I have reviewed the patient's current medications. With intolerance to codeine and possibly also morphine, will try tessalon perles tid + azithromycin for cough. Chemotherapy teaching for gemzar done. Discussed tyenol if fever/ flu symptoms shortly after gemzar administration. Confirmed with patient that she has been on ditropan x years for cystitis, with significant improvement in the cystitis. Xerostomia is likely due to ditropan but more tolerable for her than cystitis.  DISCUSSION We have discussed options for further chemotherapy, given lact of response to taxol carboplatin (also poorly tolerated), cardiac disease and progression now after ~ 10 months   on topotecan avastin. Quality of life is particularly priority for patient. We have discussed options for single agent gemzar q o week, gemzar + CDDP q o week, oral etoposide or possibly Alimta. Dr Elenora Gamma preference was single agent gemzar. We have discussed these regimens, administration differences and cycle lengths. We have discussed typical side effects of the different regimens, including possible fatigue and fever with gemzar. She understands that we can change regimen if initial choice is not helpful or not tolerated.  She is in favor of single agent gemzar, which we will start June 1 as long as ANC >=1.5 and plt >=100k, keeping treatments on Mondays as she prefers and allowing for upcoming cataract surgery. She has had teaching by RN today and will sign consent prior to cycle 1. I will see her back ~ a  week after first gemzar, with labs.  She will rest voice and add zpack + tessalon as above for laryngitis and cough. As CT chest last week was done after start of symptoms and does not show obvious concern, will not repeat xray now. She is to call if symptoms do not progressively improve (would need CXR and possibly pulmonary or ENT if so).    I confirmed with patient and husband that she did have negative BRCA testing, in ~ 2012.  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 Progression by marker and in bulky disease by CT. Plan change to every other week gemzar beginning 07-16-13. 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 OK out from last avastin for surgery on right later this week. 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 past tobacco, DCd 2003  7.PAC in  8.xerostomia: may be from ditropan, see above 9. NP cough x 10 days and laryngitis: plan tessalon and zpack now, may need other evaluation if does not improve. See above 10. Negative BRCA testing ~ 2012 11.Living will done, no copy in this EMR that I can locate   Patient and husband are comfortable with discussion and plan above. Time spent 40 min including >50% counseling and coordination of care.  Chemo orders entered.  CC PCP, Dr Lucita Ferrara and Dr Alycia Rossetti   Gordy Levan, MD   07/02/2013, 11:02 AM

## 2013-07-03 ENCOUNTER — Encounter: Payer: Self-pay | Admitting: Oncology

## 2013-07-03 ENCOUNTER — Ambulatory Visit: Payer: Medicare Other

## 2013-07-03 ENCOUNTER — Ambulatory Visit: Payer: Medicare Other | Admitting: Adult Health

## 2013-07-03 ENCOUNTER — Other Ambulatory Visit: Payer: Medicare Other

## 2013-07-03 LAB — CA 125: CA 125: 131.6 U/mL — AB (ref 0.0–30.2)

## 2013-07-03 NOTE — Progress Notes (Signed)
[facility] OF TREATMENT   Name: [name] [name] Date: 07/03/2013 MRN: [id number] DOB: Sep 20, 1945   TREATMENT DATES: 07-24-2012 thru 06-04-2013    REFERRING PHYSICIAN: Nancy Marus  DIAGNOSIS: ovarian cancer  STAGE AT START OF TREATMENT: IV   INTENT: control   DRUGS OR REGIMENS GIVEN: topotecan + avastin   MAJOR TOXICITIES: steroid related cataracts,  mild cytopenias, fatigue   REASON TREATMENT STOPPED: progression   PERFORMANCE STATUS AT END: ECOG 1   ONGOING PROBLEMS: cataract OD, fatigue, mild anemia   FOLLOW UP PLANS: change chemotherapy regimen. Continue follow up with medical oncology and gyn oncology

## 2013-07-06 ENCOUNTER — Encounter: Payer: Self-pay | Admitting: *Deleted

## 2013-07-09 ENCOUNTER — Other Ambulatory Visit: Payer: Self-pay | Admitting: Oncology

## 2013-07-10 ENCOUNTER — Ambulatory Visit: Payer: Medicare Other

## 2013-07-10 ENCOUNTER — Other Ambulatory Visit: Payer: Medicare Other

## 2013-07-10 ENCOUNTER — Ambulatory Visit: Payer: Medicare Other | Admitting: Physician Assistant

## 2013-07-16 ENCOUNTER — Other Ambulatory Visit: Payer: Self-pay | Admitting: Oncology

## 2013-07-16 ENCOUNTER — Other Ambulatory Visit: Payer: Self-pay | Admitting: *Deleted

## 2013-07-16 ENCOUNTER — Ambulatory Visit: Payer: Medicare Other

## 2013-07-16 ENCOUNTER — Ambulatory Visit (HOSPITAL_BASED_OUTPATIENT_CLINIC_OR_DEPARTMENT_OTHER): Payer: Medicare Other

## 2013-07-16 ENCOUNTER — Other Ambulatory Visit (HOSPITAL_BASED_OUTPATIENT_CLINIC_OR_DEPARTMENT_OTHER): Payer: Medicare Other

## 2013-07-16 VITALS — BP 120/70 | HR 72 | Temp 97.8°F

## 2013-07-16 DIAGNOSIS — Z452 Encounter for adjustment and management of vascular access device: Secondary | ICD-10-CM

## 2013-07-16 DIAGNOSIS — C569 Malignant neoplasm of unspecified ovary: Secondary | ICD-10-CM

## 2013-07-16 DIAGNOSIS — C50919 Malignant neoplasm of unspecified site of unspecified female breast: Secondary | ICD-10-CM

## 2013-07-16 DIAGNOSIS — R350 Frequency of micturition: Secondary | ICD-10-CM

## 2013-07-16 DIAGNOSIS — R300 Dysuria: Secondary | ICD-10-CM

## 2013-07-16 DIAGNOSIS — N390 Urinary tract infection, site not specified: Secondary | ICD-10-CM

## 2013-07-16 DIAGNOSIS — Z95828 Presence of other vascular implants and grafts: Secondary | ICD-10-CM

## 2013-07-16 LAB — URINALYSIS, MICROSCOPIC - CHCC
Bilirubin (Urine): NEGATIVE
Blood: NEGATIVE
Glucose: NEGATIVE mg/dL
KETONES: NEGATIVE mg/dL
NITRITE: NEGATIVE
PH: 6 (ref 4.6–8.0)
Specific Gravity, Urine: 1.03 (ref 1.003–1.035)
Urobilinogen, UR: 0.2 mg/dL (ref 0.2–1)

## 2013-07-16 LAB — COMPREHENSIVE METABOLIC PANEL
ALT: 27 U/L (ref 0–35)
AST: 30 U/L (ref 0–37)
Albumin: 3.5 g/dL (ref 3.5–5.2)
Alkaline Phosphatase: 95 U/L (ref 39–117)
BUN: 21 mg/dL (ref 6–23)
CALCIUM: 9.6 mg/dL (ref 8.4–10.5)
CHLORIDE: 106 meq/L (ref 96–112)
CO2: 21 meq/L (ref 19–32)
Creatinine, Ser: 0.71 mg/dL (ref 0.50–1.10)
Glucose, Bld: 74 mg/dL (ref 70–99)
POTASSIUM: 4.3 meq/L (ref 3.5–5.3)
SODIUM: 140 meq/L (ref 135–145)
TOTAL PROTEIN: 6.9 g/dL (ref 6.0–8.3)
Total Bilirubin: 0.3 mg/dL (ref 0.3–1.2)

## 2013-07-16 MED ORDER — SODIUM CHLORIDE 0.9 % IJ SOLN
10.0000 mL | INTRAMUSCULAR | Status: DC | PRN
  Administered 2013-07-16: 10 mL via INTRAVENOUS
  Filled 2013-07-16: qty 10

## 2013-07-16 MED ORDER — CIPROFLOXACIN HCL 250 MG PO TABS: 250.0000 mg | ORAL_TABLET | Freq: Two times a day (BID) | ORAL | Status: DC

## 2013-07-16 MED ORDER — HEPARIN SOD (PORK) LOCK FLUSH 100 UNIT/ML IV SOLN
500.0000 [IU] | Freq: Once | INTRAVENOUS | Status: AC
  Administered 2013-07-16: 500 [IU] via INTRAVENOUS
  Filled 2013-07-16: qty 5

## 2013-07-16 MED ORDER — SODIUM CHLORIDE 0.9 % IJ SOLN
10.0000 mL | INTRAMUSCULAR | Status: AC | PRN
  Filled 2013-07-16: qty 10

## 2013-07-16 MED ORDER — ALTEPLASE 2 MG IJ SOLR
2.0000 mg | Freq: Once | INTRAMUSCULAR | Status: AC | PRN
  Administered 2013-07-16: 2 mg
  Filled 2013-07-16: qty 2

## 2013-07-16 NOTE — Progress Notes (Signed)
Checked port for blood return @ 4 pm & very sluggish blood return.  Allowed TPA to dwell another 30 minutes & was able to get blood return & labs drawn.  Flushed per protocol & chemo not given due to pt having a UTI.

## 2013-07-18 ENCOUNTER — Encounter: Payer: Self-pay | Admitting: Cardiovascular Disease

## 2013-07-18 ENCOUNTER — Ambulatory Visit (INDEPENDENT_AMBULATORY_CARE_PROVIDER_SITE_OTHER): Payer: Medicare Other | Admitting: Cardiovascular Disease

## 2013-07-18 VITALS — BP 128/72 | HR 65 | Ht 67.0 in | Wt 150.0 lb

## 2013-07-18 DIAGNOSIS — I2510 Atherosclerotic heart disease of native coronary artery without angina pectoris: Secondary | ICD-10-CM

## 2013-07-18 DIAGNOSIS — I10 Essential (primary) hypertension: Secondary | ICD-10-CM

## 2013-07-18 DIAGNOSIS — E7800 Pure hypercholesterolemia, unspecified: Secondary | ICD-10-CM

## 2013-07-18 MED ORDER — ASPIRIN EC 81 MG PO TBEC: 81.0000 mg | DELAYED_RELEASE_TABLET | Freq: Every day | ORAL | Status: AC

## 2013-07-18 NOTE — Assessment & Plan Note (Signed)
[name] [name] is doing well from a cardiac standpoint.  No angina.   No dyspnea.   Will see her in 6 months with ov and fasting lipids, liver, bmp

## 2013-07-18 NOTE — Patient Instructions (Signed)
Your physician has recommended you make the following change in your medication:  DECREASE Aspirin to 81 mg once daily  Your physician wants you to follow-up in: 6 months with Dr. Acie Fredrickson.  You will receive a reminder letter in the mail two months in advance. If you don't receive a letter, please call our office to schedule the follow-up appointment.  Your physician recommends that you return for lab work in: 6 months on the day of or a few days before your office visit with Dr. Acie Fredrickson.  You will need to FAST for this appointment - nothing to eat or drink after midnight the night before except water.

## 2013-07-18 NOTE — Progress Notes (Signed)
Kirby Funk Date of Birth  04-Apr-1945       [facility]    [employer] [address], [address], [place], [place]  [address]   [place], [place]  [address] [phone]     [phone]   Fax  [phone]    Fax [phone]  Problem List: 1. CAD- CABG 2. Breast cancer - on Tamoxifin  3. Hyperlipidemia  4. Interstitial cystitis  History of Present Illness:  Inez Catalina has not been doing very well.  She presented with CP and cardiac cath revealed that the saphenous vein graft to the left circumflex artery was severely diseased. Her native circumflex artery has only minor luminal irregularities. The saphenous vein graft to right coronary artery was also found to be occluded and her native right coronary artery is chronically occluded.  Since that time she's been tried on medical therapy.  We doubled her metoprol and added isosorbide.. This has caused profound fatigue. She also has had severe shortness of breath, and severe headache. She has stopped the isosorbide ( because of the headache).  She also has been having lots of acid reflux and a chronic cough.  She has noted exertional dyspnea, and cough.  April 18, 2012:  That he has had a rough time since I last saw her. She has been receiving chemotherapy for ovarian cancer. She has had bilateral Pleurx tubes for chronic pleural effusions appear she's had talc placement into her pleural space.  She has just completed her chemotherapy and is now scheduled have surgical debridement of her tumor and ovaries. We have obtained an echocardiogram which reveals normal left systolic function. [name] not having any episodes of chest pain or shortness breath. She is very fatigued and quite deconditioned.  July 19, 2012:  She has had abdomina surgery since I last saw her.  She did well from a cardiac standpoint.  She still has lots of tumors and will be starting another round of chemotherapy.    Sept. 4, 2014:  That he presents today as a work in visit for severe shortness of breath.  She has had severe dyspnea while walking - especially in the morning.    She is much better from a cancer standpoint.   . No cough, no fever, no orthopnea or PND.  No angina pain.    When she was originally diagnosed with ovarian cancer she was found to have large pleural effusions. She had pleuraldesis with talc powder to her left pleural space.   Her tumor markers are down to 30 (started at 300).    Dec. 4, 2014:  He presents today for evaluation of shortness of breath.    An echocardiogram performed at [facility]  reveals normal left ventricular systolic function. She was recently seen by Dr. Melvyn Novas and he would like to try Bisoprolol instead of Coreg.    July 18, 2013:  Doing well from a cardiac standpoint. Still battling her ovarian cancer.  Will start an hew chemo next week.  The last chemo gave her cataracts - she has had surgery for that.    Current Outpatient Prescriptions on File Prior to Visit  Medication Sig Dispense Refill  . ALPRAZolam (XANAX) 0.25 MG tablet Take 1 tablet (0.25 mg total) by mouth every 8 (eight) hours as needed for anxiety.  30 tablet  4  . Alum & Mag Hydroxide-Simeth (MAGIC MOUTHWASH) SOLN Take 5 mLs by mouth 4 (four) times daily. Take 5 ml  by mouth four times daily as needed.  240 mL  2  . aspirin 325 MG tablet Take 325 mg by mouth at bedtime.       [name] atorvastatin (LIPITOR) 20 MG tablet TAKE ONE TABLET BY MOUTH ONCE DAILY  90 tablet  3  . BESIVANCE 0.6 % SUSP       . bisoprolol (ZEBETA) 10 MG tablet Take 1 tablet (10 mg total) by mouth daily.  30 tablet  6  . ciprofloxacin (CIPRO) 250 MG tablet Take 1 tablet (250 mg total) by mouth 2 (two) times daily.  14 tablet  0  . docusate sodium (COLACE) 100 MG capsule Take 200 mg by mouth daily.      [name] EPINEPHrine (EPIPEN 2-PAK) 0.3 mg/0.3 mL DEVI Inject 0.3 mg into the muscle daily as needed (allergic reaction).       .  famotidine (PEPCID) 20 MG tablet Take 20 mg by mouth. Take 1 tablet at bedtime by mouth      . fluticasone (FLONASE) 50 MCG/ACT nasal spray Place 2 sprays into the nose daily as needed for allergies.      [name] gabapentin (NEURONTIN) 100 MG capsule TAKE ONE CAPSULE  DAILY      . lidocaine-prilocaine (EMLA) cream Apply topically as needed.  30 g  1  . LORazepam (ATIVAN) 0.5 MG tablet Take 0.5 mg by mouth every 6 (six) hours as needed for anxiety (take one tab every 6 hours as needed for nausea and vomiting).      . nitroGLYCERIN (NITROSTAT) 0.4 MG SL tablet Place 0.4 mg under the tongue every 5 (five) minutes as needed. Chest pain      . omeprazole (PRILOSEC) 20 MG capsule Take 40 mg by mouth daily.      . ondansetron (ZOFRAN) 8 MG tablet Take 1 tablet (8 mg total) by mouth 2 (two) times daily. Take two times a day starting the day after chemo for 2 days. Then take two times a day as needed for nausea or vomiting.  30 tablet  1  . oxybutynin (DITROPAN-XL) 10 MG 24 hr tablet Take 10 mg by mouth daily.      Vladimir Faster Glycol-Propyl Glycol 0.4-0.3 % SOLN Place 1 drop into both eyes every morning. As needed      . senna (SENOKOT) 8.6 MG tablet Take 2 tablets by mouth at bedtime.       . SF 5000 PLUS 1.1 % CREA dental cream Place 1.1 % onto teeth at bedtime.       [name] trimethoprim (TRIMPEX) 100 MG tablet Take 100 mg by mouth every morning.       . valACYclovir (VALTREX) 500 MG tablet Take 1 tablet (500 mg total) by mouth daily.  30 tablet  7  . venlafaxine XR (EFFEXOR-XR) 75 MG 24 hr capsule TAKE ONE CAPSULE EACH DAY  30 capsule  4  . [DISCONTINUED] prochlorperazine (COMPAZINE) 25 MG suppository Place 1 suppository (25 mg total) rectally every 12 (twelve) hours as needed for nausea.  12 suppository  3   Current Facility-Administered Medications on File Prior to Visit  Medication Dose Route Frequency Provider Last Rate Last Dose  . influenza  inactive virus vaccine (FLUZONE/FLUARIX) injection 0.5 mL  0.5 mL  Intramuscular Once Rita Ohara, MD      . sodium chloride 0.9 % injection 10 mL  10 mL Intravenous PRN Deatra Robinson, MD   10 mL at 05/29/13 1210  . sodium chloride 0.9 % injection 10  mL  10 mL Intravenous PRN Minette Headland, NP   10 mL at 06/12/13 0842  . sodium chloride 0.9 % injection 10 mL  10 mL Intravenous PRN Lennis Marion Downer, MD        Allergies  Allergen Reactions  . Codeine Nausea And Vomiting       . Isosorbide Other (See Comments)    Extreme headaches  . Other Swelling    All Lip moisturizers except Vaseline.  . Phenothiazines Other (See Comments)    Makes her stop breathing.  [place] [Pentazocine] Nausea And Vomiting  . Yellow Jacket Venom Anaphylaxis  . Ambien [Zolpidem Tartrate]     Bad dreams  . Decadron [Dexamethasone]     "Caused vision problems"  . Tegaderm Ag Mesh [Silver] Rash    USE ONLY OPSITE TO PAC    Past Medical History  Diagnosis Date  . Interstitial cystitis     on chronic antibiotics  . Hyperlipidemia   . Frequent UTI     on prophylaxis  . 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 CAD (coronary artery disease)     a. s/p CABG 7/13;   b.  LHC 12/01/11:  pLAD 70%, mLAD 40%, CFX 40-50% prior to takeoff of the OM2, oRCA occluded, mid vessel filled via R->R collaterals and distal vessel filled by L->R collaterals, S-OM1/OM2 (small and diffusely dz) with mid 90% stenosis, 90% at OM1 anastomotic site, continuation of OM2 occluded, S-Dx patent, S-PDA occluded, L-LAD ok, EF 55-65%  => Med Rx rec.  [name] Hx of echocardiogram     Echo 5/13: EF [id number], grade 2 diastolic dysfunction  . Hypercholesterolemia   . Pleural effusion 12/28/11    s/p pleurx catheter  . GERD (gastroesophageal reflux disease)   . Arthritis     "just a little; lower back" (12/28/11)  . Gout attack 08/2011    related to "stress post OHS"  . Depression   . Breast cancer     "left"; on Tamoxifen  . S/P thoracentesis 12/28/11    "for pleural effusion" (12/28/2011)  . Ovarian cancer   .  Tachycardia   . Neuromuscular disorder     peripheral neuropathy  . Ovarian cancer 02/28/2013    Past Surgical History  Procedure Laterality Date  . Breast lumpectomy  04/2009    left  . Cesarean section  1973; 1976  . Muscle release  1960    L neck and chest.; "when I was 13; pneumonia settled in my left neck"  . Coronary artery bypass graft  08/18/2011    Procedure: CORONARY ARTERY BYPASS GRAFTING (CABG);  Surgeon: Gaye Pollack, MD;  Location: [place];  Service: Open Heart Surgery;  Laterality: N/A;  Coronary Artery Bypass Graft times five utilizing the left internal mammary artery and the left greater saphenous vein harvested endoscopically.  . Abdominal hysterectomy  1976  . Appendectomy  1976  . Portacath placement  01/06/2012    Procedure: INSERTION PORT-A-CATH;  Surgeon: Gaye Pollack, MD;  Location: [place];  Service: Thoracic;  Laterality: Left;  . Chest tube insertion  01/06/2012    Procedure: INSERTION PLEURAL DRAINAGE CATHETER;  Surgeon: Gaye Pollack, MD;  Location: [facility];  Service: Thoracic;  Laterality: Bilateral;  . Removal of pleural drainage catheter Right 04/12/2012    Procedure: MINOR REMOVAL OF PLEURAL DRAINAGE CATHETER;  Surgeon: Gaye Pollack, MD;  Location: [place];  Service: Thoracic;  Laterality: Right;  . Talc pleurodesis Left 04/12/2012  Procedure: Pietro Cassis;  Surgeon: Gaye Pollack, MD;  Location: [place];  Service: Thoracic;  Laterality: Left;  . Portacath placement Left 04/17/2012    Procedure: INSERTION PORT-A-CATH;  Surgeon: Gaye Pollack, MD;  Location: [facility];  Service: Thoracic;  Laterality: Left;  . Removal of pleural drainage catheter Left 04/17/2012    Procedure: REMOVAL OF PLEURAL DRAINAGE CATHETER;  Surgeon: Gaye Pollack, MD;  Location: [place];  Service: Thoracic;  Laterality: Left;  . Port-a-cath removal Left 04/17/2012    Procedure: REMOVAL PORT-A-CATH;  Surgeon: Gaye Pollack, MD;  Location: [facility];  Service: Thoracic;  Laterality: Left;  .  Cardiac catheterization    . Laparotomy Bilateral 05/30/2012    Procedure: Resection of umbilical mass, Partial omentectomy;  Surgeon: Alvino Chapel, MD;  Location: [facility];  Service: Gynecology;  Laterality: Bilateral;    History  Smoking status  . Former Smoker -- 0.12 packs/day for 10 years  . Types: Cigarettes  . Quit date: 02/15/2001  Smokeless tobacco  . Never Used    History  Alcohol Use  . 0.0 oz/week    Comment: 12/28/11 "1 gin & tonic q hs", 2/5/141-2 glass a wine occa  05/26/12 none x 1 year    Family History  Problem Relation Age of Onset  . Cancer Mother 33    breast cancer  . Heart disease Father 64    MI at 15, CABG in 79's  . Hepatitis Father     C from blood transfusion  . Heart disease Brother     CABG in 14's  . Heart disease Paternal Aunt   . Heart disease Paternal Uncle   . Heart disease Paternal Grandfather   . Diabetes Neg Hx     Reviw of Systems:  Reviewed in the HPI.  All other systems are negative.  Physical Exam: Blood pressure 128/72, pulse 65, height 5\' 7"  (1.702 m), weight 150 lb (68.04 kg). General: chronically ill appearing female, edematous, she is looking much better / stronger today.  She has re-grown some of her hair.  Head: Normocephalic, atraumatic, sclera non-icteric, mucus membranes are moist,  Neck: Supple. Carotids are 2 + without bruits. No JVD Lungs: mostly clear       Heart: regular rate.  normal  S1 S2. No murmurs, gallops or rubs. Abdomen: mild distension Msk:   Extremities: No clubbing or cyanosis.  No  edema.  Distal pedal pulses are 2+ and equal bilaterally. Neuro: Alert and oriented X 3. Moves all extremities spontaneously. Psych:  Responds to questions appropriately with a normal affect.  ECG: July 18, 2013:  NSR at 65.  No ST or T wave changes.   Assessment / Plan:

## 2013-07-20 ENCOUNTER — Telehealth: Payer: Self-pay

## 2013-07-20 DIAGNOSIS — C569 Malignant neoplasm of unspecified ovary: Secondary | ICD-10-CM

## 2013-07-20 LAB — URINE CULTURE

## 2013-07-20 NOTE — Telephone Encounter (Signed)
Spoke with [name] and she states that she is not running a temperature.  She is not having pain with urination anymore and her urine output has increased with urination.  Told her that Dr. Marko Plume said that she could stop the cipro as the urine culture showed insignificant growth.  She is to call the office if she develops any urinary symptoms.  [name] verbalized understanding. Spoke with Camera operator [name] [name] in the infusion room.  [name] 1st Gemzar was added to infusion for 07-23-13 at 1500 after Dr. Mariana Kaufman appointment as it was held 07-16-13.

## 2013-07-20 NOTE — Telephone Encounter (Signed)
         [name] [name], [name] M. - 07/02/13 ','<More Detail >>       Gordy Levan, MD       Sent: Fri July 20, 2013 10:45 AM    To: Baruch Merl, RN                   Message     < 100k organisms so likely not significant. Please call to see how she is doing. Stop Cipro. Let me know if still bladder symptoms, fever etc, but if resolved will not treat this further    (I don't see documentation of symptoms when we sent culture and started antibiotics on 6-1 )    thanks    ----- Message -----    From: Baruch Merl, RN    Sent: 07/20/2013 10:34 AM    To: Gordy Levan, MD        The organism isolated in the urine culture from 07-16-13 is resistant to cipro 250 mg bid x 7 days which was begun 07-16-13.    Here are the results. Probably in your in basket.

## 2013-07-22 ENCOUNTER — Other Ambulatory Visit: Payer: Self-pay | Admitting: Oncology

## 2013-07-22 DIAGNOSIS — C569 Malignant neoplasm of unspecified ovary: Secondary | ICD-10-CM

## 2013-07-23 ENCOUNTER — Ambulatory Visit (HOSPITAL_BASED_OUTPATIENT_CLINIC_OR_DEPARTMENT_OTHER): Payer: Medicare Other

## 2013-07-23 ENCOUNTER — Other Ambulatory Visit (HOSPITAL_BASED_OUTPATIENT_CLINIC_OR_DEPARTMENT_OTHER): Payer: Medicare Other

## 2013-07-23 ENCOUNTER — Ambulatory Visit (HOSPITAL_BASED_OUTPATIENT_CLINIC_OR_DEPARTMENT_OTHER): Payer: Medicare Other | Admitting: Oncology

## 2013-07-23 ENCOUNTER — Encounter: Payer: Self-pay | Admitting: Oncology

## 2013-07-23 VITALS — BP 133/78 | HR 73 | Temp 97.9°F | Resp 18 | Ht 67.0 in | Wt 153.0 lb

## 2013-07-23 DIAGNOSIS — C569 Malignant neoplasm of unspecified ovary: Secondary | ICD-10-CM

## 2013-07-23 DIAGNOSIS — J910 Malignant pleural effusion: Secondary | ICD-10-CM

## 2013-07-23 DIAGNOSIS — C801 Malignant (primary) neoplasm, unspecified: Secondary | ICD-10-CM

## 2013-07-23 DIAGNOSIS — I2510 Atherosclerotic heart disease of native coronary artery without angina pectoris: Secondary | ICD-10-CM

## 2013-07-23 DIAGNOSIS — Z5111 Encounter for antineoplastic chemotherapy: Secondary | ICD-10-CM

## 2013-07-23 DIAGNOSIS — Z87891 Personal history of nicotine dependence: Secondary | ICD-10-CM

## 2013-07-23 LAB — CBC WITH DIFFERENTIAL/PLATELET
BASO%: 0.8 % (ref 0.0–2.0)
Basophils Absolute: 0.1 10e3/uL (ref 0.0–0.1)
EOS%: 3.4 % (ref 0.0–7.0)
Eosinophils Absolute: 0.3 10e3/uL (ref 0.0–0.5)
HCT: 34.7 % — ABNORMAL LOW (ref 34.8–46.6)
HGB: 10.8 g/dL — ABNORMAL LOW (ref 11.6–15.9)
LYMPH%: 11.2 % — ABNORMAL LOW (ref 14.0–49.7)
MCH: 29.6 pg (ref 25.1–34.0)
MCHC: 31.1 g/dL — ABNORMAL LOW (ref 31.5–36.0)
MCV: 95.2 fL (ref 79.5–101.0)
MONO#: 0.7 10e3/uL (ref 0.1–0.9)
MONO%: 7.5 % (ref 0.0–14.0)
NEUT#: 6.7 10e3/uL — ABNORMAL HIGH (ref 1.5–6.5)
NEUT%: 77.1 % — ABNORMAL HIGH (ref 38.4–76.8)
Platelets: 325 10e3/uL (ref 145–400)
RBC: 3.64 10e6/uL — ABNORMAL LOW (ref 3.70–5.45)
RDW: 21.4 % — ABNORMAL HIGH (ref 11.2–14.5)
WBC: 8.7 10e3/uL (ref 3.9–10.3)
lymph#: 1 10e3/uL (ref 0.9–3.3)

## 2013-07-23 LAB — COMPREHENSIVE METABOLIC PANEL (CC13)
ALT: 29 U/L (ref 0–55)
AST: 29 U/L (ref 5–34)
Albumin: 3.5 g/dL (ref 3.5–5.0)
Alkaline Phosphatase: 98 U/L (ref 40–150)
Anion Gap: 10 meq/L (ref 3–11)
BUN: 15.9 mg/dL (ref 7.0–26.0)
CO2: 24 meq/L (ref 22–29)
Calcium: 9.2 mg/dL (ref 8.4–10.4)
Chloride: 108 meq/L (ref 98–109)
Creatinine: 0.8 mg/dL (ref 0.6–1.1)
Glucose: 99 mg/dL (ref 70–140)
Potassium: 4.3 meq/L (ref 3.5–5.1)
Sodium: 143 meq/L (ref 136–145)
Total Bilirubin: 0.44 mg/dL (ref 0.20–1.20)
Total Protein: 6.9 g/dL (ref 6.4–8.3)

## 2013-07-23 MED ORDER — HEPARIN SOD (PORK) LOCK FLUSH 100 UNIT/ML IV SOLN
500.0000 [IU] | Freq: Once | INTRAVENOUS | Status: AC | PRN
  Administered 2013-07-23: 500 [IU]
  Filled 2013-07-23: qty 5

## 2013-07-23 MED ORDER — SODIUM CHLORIDE 0.9 % IV SOLN
Freq: Once | INTRAVENOUS | Status: AC
  Administered 2013-07-23: 16:00:00 via INTRAVENOUS

## 2013-07-23 MED ORDER — SODIUM CHLORIDE 0.9 % IJ SOLN
10.0000 mL | INTRAMUSCULAR | Status: DC | PRN
  Administered 2013-07-23: 10 mL
  Filled 2013-07-23: qty 10

## 2013-07-23 MED ORDER — SODIUM CHLORIDE 0.9 % IV SOLN
800.0000 mg/m2 | Freq: Once | INTRAVENOUS | Status: AC
  Administered 2013-07-23: 1444 mg via INTRAVENOUS
  Filled 2013-07-23: qty 37.98

## 2013-07-23 MED ORDER — ACETAMINOPHEN 325 MG PO TABS
325.0000 mg | ORAL_TABLET | Freq: Once | ORAL | Status: AC
  Administered 2013-07-23: 325 mg via ORAL

## 2013-07-23 MED ORDER — ACETAMINOPHEN 325 MG PO TABS
ORAL_TABLET | ORAL | Status: AC
  Filled 2013-07-23: qty 1

## 2013-07-23 MED ORDER — ONDANSETRON 8 MG/50ML IVPB (CHCC)
8.0000 mg | Freq: Once | INTRAVENOUS | Status: AC
  Administered 2013-07-23: 8 mg via INTRAVENOUS

## 2013-07-23 MED ORDER — ONDANSETRON 8 MG/NS 50 ML IVPB
INTRAVENOUS | Status: AC
  Filled 2013-07-23: qty 8

## 2013-07-23 NOTE — Progress Notes (Signed)
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 She is to begin every other week gemzar today, start delayed a week due to UTI. In the interim since she stopped avastin topotecan on 06-04-13, she had successful cataract surgery OD (this better tolerated as Fuch's dystrophy had been unexpectedly found at cataract surgery previously OS). She had respiratory infection with severe laryngitis also in May, resolved with Zpack and tessalon perles.  Patient has felt gradually stronger in this break off of chemotherapy, with no symptoms that she can tell related to the progressive disease. She has been able to enjoy some yard work, now able to kneel down which she could not do when proximal thigh muscles weaker from steroids initially during chemotherapy.  She has no bladder or respiratory symptoms now. She saw Dr Acie Fredrickson last week, has 6 month follow up with him. Appetite is better, no bleeding, bowels moving regularly with senna and colace.  No problems with PAC.   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 Patient had 3 sentinel biopsied all of them were negative for metastatic disease. Patient underwent radiation therapy between 06/05/2009 through 07/02/2009. She was then begun on Tamoxifen 20 mg daily in 09/2009. BRCA testing reportedly negative in ~ 2012. #2 In 12/2011 she received the diagnosis of gynecologic malignancy presenting with abdominal mass and pleural effusions. Patient was originally hospitalized with shortness of breath in 12/2011. 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 She also had malignant ascites. She underwent bilateral thoracentesis and paracentesis procedures during her hospitalization. During her hospitalization she was seen by gynecologic oncology.  #3 Patient began neoadjuvant chemotherapy consisting of Taxol and Carboplatinum. Her first cycle was administered during her hospitalization. She completed 6 cycles of Taxol/Carboplatinum from 01/07/2012 - 06/23/2012. Per patient "very sick with initial chemo and it did not work"  #4. laparotomy in 05/2012 that revealed significant residual disease.  #5 Patient began adjuvant therapy with Topotecan/Avastin starting on 07/24/2012, given thru 06-04-2013. Progression by CA125 marker and CT May 2015.   Review of systems as above, also: No fever now. Voice back to normal. No chest pain. No LE swelling. Usual weight for years prior to illness was 130 lbs.  Remainder of 10 point Review of Systems negative.  Objective:  Vital signs in last 24 hours:  BP 133/78  Pulse 73  Temp(Src) 97.9 F (36.6 C) (Oral)  Resp 18  Ht $R'5\' 7"'hE$  (1.702 m)  Wt 153 lb (69.4 kg)  BMI 23.96 kg/m2 Weight is up 3 lbs. Alert, oriented and appropriate. Ambulatory without assistance, able to stand  up from chair more easily..  Partial alopecia  HEENT:PERRL, sclerae not icteric. Oral mucosa chronically dry without lesions, posterior pharynx clear.  Neck  supple. No JVD.  Lymphatics:no cervical,suraclavicular, axillary or inguinal adenopathy Resp: somewhat diminished BS thruout, otherwise clear to auscultation bilaterally and normal percussion bilaterally Cardio: regular rate and rhythm. No gallop. GI: soft, nontender, full suggests ascites but not tightly distended, no appreciable mass or organomegaly. Normally active bowel sounds. Surgical incision not remarkable. Musculoskeletal/ Extremities: without pitting edema, cords, tenderness Neuro: no change peripheral neuropathy. Otherwise nonfocal. PSYCH mood and affect appropriate Skin without rash, ecchymosis, petechiae Breasts: without dominant mass, skin or nipple findings. Left lumpectomy Axillae benign. Portacath-without erythema or tenderness  Lab Results:  Results for orders placed in visit on 07/23/13  CBC WITH DIFFERENTIAL      Result Value Ref Range   WBC 8.7  3.9 - 10.3 10e3/uL   NEUT# 6.7 (*) 1.5 - 6.5 10e3/uL   HGB 10.8 (*) 11.6 - 15.9 g/dL   HCT 34.7 (*) 34.8 - 46.6 %   Platelets 325  145 - 400 10e3/uL   MCV 95.2  79.5 - 101.0 fL   MCH 29.6  25.1 - 34.0 pg   MCHC 31.1 (*) 31.5 - 36.0 g/dL   RBC 3.64 (*) 3.70 - 5.45 10e6/uL   RDW 21.4 (*) 11.2 - 14.5 %   lymph# 1.0  0.9 - 3.3 10e3/uL   MONO# 0.7  0.1 - 0.9 10e3/uL   Eosinophils Absolute 0.3  0.0 - 0.5 10e3/uL   Basophils Absolute 0.1  0.0 - 0.1 10e3/uL   NEUT% 77.1 (*) 38.4 - 76.8 %   LYMPH% 11.2 (*) 14.0 - 49.7 %   MONO% 7.5  0.0 - 14.0 %   EOS% 3.4  0.0 - 7.0 %   BASO% 0.8  0.0 - 2.0 %  COMPREHENSIVE METABOLIC PANEL (RF16)      Result Value Ref Range   Sodium 143  136 - 145 mEq/L   Potassium 4.3  3.5 - 5.1 mEq/L   Chloride 108  98 - 109 mEq/L   CO2 24  22 - 29 mEq/L   Glucose 99  70 - 140 mg/dl   BUN 15.9  7.0 - 26.0 mg/dL   Creatinine 0.8  0.6 - 1.1 mg/dL   Total Bilirubin 0.44  0.20 - 1.20 mg/dL   Alkaline Phosphatase 98  40 - 150 U/L   AST 29  5 - 34 U/L   ALT 29  0 - 55 U/L   Total Protein 6.9  6.4 - 8.3  g/dL   Albumin 3.5  3.5 - 5.0 g/dL   Calcium 9.2  8.4 - 10.4 mg/dL   Anion Gap 10  3 - 11 mEq/L    Last CA 125 on 07-02-13 was 131.6  Studies/Results:  No results found.  Medications: I have reviewed the patient's current medications. She is aware that she could have transient flu like symptoms/ fever with gemzar; I have suggested tylenol at hs tonight in addition to premed tylenol with treatment here.  DISCUSSION: patient understands that we will adjust chemotherapy depending on symptoms and tolerance. She has had all questions answered to her satisfaction and is in agreement with plan.  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 Progression by marker and in bulky disease by CT. Starting every other week gemzar today (07-23-13); I will see her back with cycle 2 gemzar on 08-06-13. 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 BRCA testing ~ 2012 reportedly negative. 5.post remote hysterectomy  6. past tobacco, DCd 2003  7.PAC in  8.xerostomia: may be from ditropan, see above  9. NP cough and laryngitis 06-2013 resolved 10..Living will done        Gordy Levan, MD   07/23/2013, 3:31 PM

## 2013-07-23 NOTE — Patient Instructions (Signed)
[facility] Discharge Instructions for Patients Receiving Chemotherapy  Today you received the following chemotherapy agents Gemzar.  To help prevent nausea and vomiting after your treatment, we encourage you to take your nausea medication as prescribed.   If you develop nausea and vomiting that is not controlled by your nausea medication, call the clinic.   BELOW ARE SYMPTOMS THAT SHOULD BE REPORTED IMMEDIATELY:  *FEVER GREATER THAN 100.5 F  *CHILLS WITH OR WITHOUT FEVER  NAUSEA AND VOMITING THAT IS NOT CONTROLLED WITH YOUR NAUSEA MEDICATION  *UNUSUAL SHORTNESS OF BREATH  *UNUSUAL BRUISING OR BLEEDING  TENDERNESS IN MOUTH AND THROAT WITH OR WITHOUT PRESENCE OF ULCERS  *URINARY PROBLEMS  *BOWEL PROBLEMS  UNUSUAL RASH Items with * indicate a potential emergency and should be followed up as soon as possible.  Feel free to call the clinic you have any questions or concerns. The clinic phone number is [phone].    

## 2013-07-24 ENCOUNTER — Telehealth: Payer: Self-pay | Admitting: Oncology

## 2013-07-24 ENCOUNTER — Telehealth: Payer: Self-pay | Admitting: *Deleted

## 2013-07-24 NOTE — Telephone Encounter (Signed)
s.w. pt and advised on cx 6.15 and added 6.22 appt.Marland KitchenMarland KitchenMarland KitchenMarland Kitchenpt ok and aware

## 2013-07-24 NOTE — Telephone Encounter (Signed)
[place] at home [phone] number(s).  Message left requesting a return call for chemotherapy follow up.  Awaiting return call from patient.

## 2013-07-24 NOTE — Telephone Encounter (Signed)
Message copied by Cherylynn Ridges on Tue Jul 24, 2013  4:57 PM ------      Message from: Jaci Carrel A      Created: Mon Jul 23, 2013  4:28 PM      Regarding: Chemo follow up call       First time Gemzar. Dr Marko Plume.             Thanks ------

## 2013-07-27 ENCOUNTER — Telehealth: Payer: Self-pay | Admitting: *Deleted

## 2013-07-27 NOTE — Telephone Encounter (Signed)
Message received from collaborative nurse that patient called reporting nausea, vomiting, loose stools and headache.  Called patient who says she "started a new chemotherapy gemzar and is not feeling so good.  I don't feel like eating then I throw up.  This morning I vomited after attempting breakfast.  Clear emesis because I'm not eating.  Drinking maybe a cup a day of water.  The more I drink it makes me feel yucky.  I have tried tylenol for my headache.  No fevers.  I do not have any trouble with my bladder or bowels.  My ankles, feet and knees hurt and I have a bad headache.      Asked if she took zofran twice daily for two days after chemotherapy and she did not,.  Has tried compazine for n/v. Will notify providers.  May reach Ms. Mortellaro at [phone].

## 2013-07-27 NOTE — Telephone Encounter (Signed)
Spoke with [name] [name] and offered her to come in for IVF and antiemetics this afternoon.  She stated that she took the zofran ~1130 and feels better.  She feels that she can drink 8 oz of fluid ~2hours and eat soup.  She does not feel she needs to come in for fluids today.  She is drinking more than a cup of fluid the last couple of days.   Instructed her to take the zofran bid for the next 2 days and then prn.  Patient verbalized understanding. She know to call if symptoms get worse.

## 2013-07-30 ENCOUNTER — Other Ambulatory Visit: Payer: Medicare Other

## 2013-07-30 ENCOUNTER — Ambulatory Visit: Payer: Medicare Other

## 2013-08-05 ENCOUNTER — Other Ambulatory Visit: Payer: Self-pay | Admitting: Oncology

## 2013-08-06 ENCOUNTER — Ambulatory Visit (HOSPITAL_BASED_OUTPATIENT_CLINIC_OR_DEPARTMENT_OTHER): Payer: Medicare Other

## 2013-08-06 ENCOUNTER — Ambulatory Visit (HOSPITAL_BASED_OUTPATIENT_CLINIC_OR_DEPARTMENT_OTHER): Payer: Medicare Other | Admitting: Oncology

## 2013-08-06 ENCOUNTER — Other Ambulatory Visit (HOSPITAL_BASED_OUTPATIENT_CLINIC_OR_DEPARTMENT_OTHER): Payer: Medicare Other

## 2013-08-06 ENCOUNTER — Telehealth: Payer: Self-pay | Admitting: *Deleted

## 2013-08-06 ENCOUNTER — Encounter: Payer: Self-pay | Admitting: Oncology

## 2013-08-06 ENCOUNTER — Telehealth: Payer: Self-pay | Admitting: Oncology

## 2013-08-06 VITALS — BP 143/82 | HR 74 | Temp 97.9°F | Resp 18 | Ht 67.0 in | Wt 148.0 lb

## 2013-08-06 DIAGNOSIS — J910 Malignant pleural effusion: Secondary | ICD-10-CM

## 2013-08-06 DIAGNOSIS — C801 Malignant (primary) neoplasm, unspecified: Secondary | ICD-10-CM

## 2013-08-06 DIAGNOSIS — Z853 Personal history of malignant neoplasm of breast: Secondary | ICD-10-CM

## 2013-08-06 DIAGNOSIS — Z5111 Encounter for antineoplastic chemotherapy: Secondary | ICD-10-CM

## 2013-08-06 DIAGNOSIS — C569 Malignant neoplasm of unspecified ovary: Secondary | ICD-10-CM

## 2013-08-06 DIAGNOSIS — I2510 Atherosclerotic heart disease of native coronary artery without angina pectoris: Secondary | ICD-10-CM

## 2013-08-06 DIAGNOSIS — Z95828 Presence of other vascular implants and grafts: Secondary | ICD-10-CM

## 2013-08-06 LAB — COMPREHENSIVE METABOLIC PANEL (CC13)
ALT: 50 U/L (ref 0–55)
AST: 51 U/L — AB (ref 5–34)
Albumin: 3.2 g/dL — ABNORMAL LOW (ref 3.5–5.0)
Alkaline Phosphatase: 145 U/L (ref 40–150)
Anion Gap: 11 mEq/L (ref 3–11)
BILIRUBIN TOTAL: 0.46 mg/dL (ref 0.20–1.20)
BUN: 13.8 mg/dL (ref 7.0–26.0)
CO2: 24 mEq/L (ref 22–29)
Calcium: 9.5 mg/dL (ref 8.4–10.4)
Chloride: 107 mEq/L (ref 98–109)
Creatinine: 0.8 mg/dL (ref 0.6–1.1)
Glucose: 116 mg/dl (ref 70–140)
POTASSIUM: 4.4 meq/L (ref 3.5–5.1)
SODIUM: 142 meq/L (ref 136–145)
Total Protein: 6.9 g/dL (ref 6.4–8.3)

## 2013-08-06 LAB — CBC WITH DIFFERENTIAL/PLATELET
BASO%: 0.3 % (ref 0.0–2.0)
Basophils Absolute: 0 10*3/uL (ref 0.0–0.1)
EOS%: 1.2 % (ref 0.0–7.0)
Eosinophils Absolute: 0.1 10*3/uL (ref 0.0–0.5)
HCT: 34.6 % — ABNORMAL LOW (ref 34.8–46.6)
HGB: 10.8 g/dL — ABNORMAL LOW (ref 11.6–15.9)
LYMPH%: 10.9 % — AB (ref 14.0–49.7)
MCH: 29.9 pg (ref 25.1–34.0)
MCHC: 31.3 g/dL — AB (ref 31.5–36.0)
MCV: 95.5 fL (ref 79.5–101.0)
MONO#: 0.9 10*3/uL (ref 0.1–0.9)
MONO%: 10.6 % (ref 0.0–14.0)
NEUT#: 6.3 10*3/uL (ref 1.5–6.5)
NEUT%: 77 % — ABNORMAL HIGH (ref 38.4–76.8)
PLATELETS: 358 10*3/uL (ref 145–400)
RBC: 3.63 10*6/uL — ABNORMAL LOW (ref 3.70–5.45)
RDW: 20 % — AB (ref 11.2–14.5)
WBC: 8.2 10*3/uL (ref 3.9–10.3)
lymph#: 0.9 10*3/uL (ref 0.9–3.3)

## 2013-08-06 MED ORDER — ONDANSETRON 8 MG/50ML IVPB (CHCC)
8.0000 mg | Freq: Once | INTRAVENOUS | Status: AC
  Administered 2013-08-06: 8 mg via INTRAVENOUS

## 2013-08-06 MED ORDER — ACETAMINOPHEN 325 MG PO TABS
325.0000 mg | ORAL_TABLET | Freq: Once | ORAL | Status: AC
  Administered 2013-08-06: 325 mg via ORAL

## 2013-08-06 MED ORDER — HEPARIN SOD (PORK) LOCK FLUSH 100 UNIT/ML IV SOLN
500.0000 [IU] | Freq: Once | INTRAVENOUS | Status: AC
  Administered 2013-08-06: 500 [IU] via INTRAVENOUS
  Filled 2013-08-06: qty 5

## 2013-08-06 MED ORDER — GEMCITABINE HCL CHEMO INJECTION 1 GM/26.3ML
400.0000 mg/m2 | Freq: Once | INTRAVENOUS | Status: AC
  Administered 2013-08-06: 722 mg via INTRAVENOUS
  Filled 2013-08-06: qty 19

## 2013-08-06 MED ORDER — SODIUM CHLORIDE 0.9 % IJ SOLN
10.0000 mL | INTRAMUSCULAR | Status: DC | PRN
  Administered 2013-08-06: 10 mL
  Filled 2013-08-06: qty 10

## 2013-08-06 MED ORDER — SODIUM CHLORIDE 0.9 % IV SOLN
Freq: Once | INTRAVENOUS | Status: AC
  Administered 2013-08-06: 14:00:00 via INTRAVENOUS

## 2013-08-06 MED ORDER — SODIUM CHLORIDE 0.9 % IJ SOLN
10.0000 mL | INTRAMUSCULAR | Status: DC | PRN
  Administered 2013-08-06: 10 mL via INTRAVENOUS
  Filled 2013-08-06: qty 10

## 2013-08-06 MED ORDER — HEPARIN SOD (PORK) LOCK FLUSH 100 UNIT/ML IV SOLN
500.0000 [IU] | Freq: Once | INTRAVENOUS | Status: AC | PRN
  Administered 2013-08-06: 500 [IU]
  Filled 2013-08-06: qty 5

## 2013-08-06 MED ORDER — ONDANSETRON 8 MG/NS 50 ML IVPB
INTRAVENOUS | Status: AC
  Filled 2013-08-06: qty 8

## 2013-08-06 MED ORDER — ACETAMINOPHEN 325 MG PO TABS
ORAL_TABLET | ORAL | Status: AC
  Filled 2013-08-06: qty 1

## 2013-08-06 NOTE — Progress Notes (Signed)
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 Last topotecan avastin was 06-04-13, and every other week gemzar begun 07-23-13. She had vomiting beginning day after chemo, unable to keep down compazine. Per phone note, she may have used zofran next few days (husband gave meds, patient does not know which antiemetic), declined IVF,but had very little po intake over next week. She reports no BM x 1 week after chemo, did not increase laxatives. She had HAs, which may have been from zofran if that was given; HAs have resolved. She was unsteady walking after a week or so of this, likely dehydrated. She had company this weekend and appetite has been some better since then. She had generalized aches beginning day 2 for a week, did improve with tylenol. She denies fever. She did not call to let us know about ongoing problems.  She has PAC.   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 Patient had 3 sentinel biopsied all of them were negative for metastatic disease. Patient underwent radiation  therapy between 06/05/2009 through 07/02/2009. She was then begun on Tamoxifen 20 mg daily in 09/2009. BRCA testing reportedly negative in ~ 2012.  #2 In 12/2011 she received the diagnosis of gynecologic malignancy presenting with abdominal mass and pleural effusions. Patient was originally hospitalized with shortness of breath in 12/2011. 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 She also had malignant ascites. She underwent bilateral thoracentesis and paracentesis procedures during her hospitalization. During her hospitalization she was seen by gynecologic oncology.  #3 Patient began neoadjuvant chemotherapy consisting of Taxol and Carboplatinum. Her first cycle was administered during her hospitalization. She completed 6 cycles of Taxol/Carboplatinum from 01/07/2012 - 06/23/2012. Per patient "very sick with initial chemo and it did not work"  #4. laparotomy in 05/2012 that revealed significant residual disease.  #5 Patient began adjuvant therapy with Topotecan/Avastin starting on 07/24/2012, given thru 06-04-2013. Progression by CA125 marker and CT May 2015. Gemzar began 07-23-13.   Review of systems as above, also: Bowels moving regularly now. Fatigued with minimal activity, does just a little then has to rest; this is not changed or worse than last months. No LE swelling. No problems with PAC. Remainder of 10 point Review of Systems negative.  Objective:  Vital signs in last 24 hours:  BP 143/82  Pulse 74  Temp(Src) 97.9 F (36.6 C) (Oral)  Resp 18  Ht $R'5\' 7"'VT$  (1.702 m)  Wt 148 lb (67.132 kg)  BMI 23.17 kg/m2 weight is down 5 lbs  Alert, oriented and appropriate. Ambulatory slowly without assistance. Talkative.  Partial  alopecia. Looks chronically ill, mouth very dry as always  HEENT:PERRL, sclerae not icteric. Xerostomia without lesions, posterior pharynx clear.  Neck supple. No JVD.  Lymphatics:no  cervical, axillary or inguinal adenopathy. Fullness left supraclavicular. Resp: clear to auscultation bilaterally and normal percussion bilaterally Cardio: regular rate and rhythm. No gallop. GI: soft, nontender, slightly distended without change, no mass or organomegaly. Normally active bowel sounds.  Musculoskeletal/ Extremities: without pitting edema, cords, tenderness Neuro: no peripheral neuropathy. Otherwise nonfocal. PSYCH mood and affect appropriate. Skin without rash, ecchymosis, petechiae Portacath-without erythema or tenderness  Lab Results:  Results for orders placed in visit on 08/06/13  CBC WITH DIFFERENTIAL      Result Value Ref Range   WBC 8.2  3.9 - 10.3 10e3/uL   NEUT# 6.3  1.5 - 6.5 10e3/uL   HGB 10.8 (*) 11.6 - 15.9 g/dL   HCT 34.6 (*) 34.8 - 46.6 %   Platelets 358  145 - 400 10e3/uL   MCV 95.5  79.5 - 101.0 fL   MCH 29.9  25.1 - 34.0 pg   MCHC 31.3 (*) 31.5 - 36.0 g/dL   RBC 3.63 (*) 3.70 - 5.45 10e6/uL   RDW 20.0 (*) 11.2 - 14.5 %   lymph# 0.9  0.9 - 3.3 10e3/uL   MONO# 0.9  0.1 - 0.9 10e3/uL   Eosinophils Absolute 0.1  0.0 - 0.5 10e3/uL   Basophils Absolute 0.0  0.0 - 0.1 10e3/uL   NEUT% 77.0 (*) 38.4 - 76.8 %   LYMPH% 10.9 (*) 14.0 - 49.7 %   MONO% 10.6  0.0 - 14.0 %   EOS% 1.2  0.0 - 7.0 %   BASO% 0.3  0.0 - 2.0 %  COMPREHENSIVE METABOLIC PANEL (EN27)      Result Value Ref Range   Sodium 142  136 - 145 mEq/L   Potassium 4.4  3.5 - 5.1 mEq/L   Chloride 107  98 - 109 mEq/L   CO2 24  22 - 29 mEq/L   Glucose 116  70 - 140 mg/dl   BUN 13.8  7.0 - 26.0 mg/dL   Creatinine 0.8  0.6 - 1.1 mg/dL   Total Bilirubin 0.46  0.20 - 1.20 mg/dL   Alkaline Phosphatase 145  40 - 150 U/L   AST 51 (*) 5 - 34 U/L   ALT 50  0 - 55 U/L   Total Protein 6.9  6.4 - 8.3 g/dL   Albumin 3.2 (*) 3.5 - 5.0 g/dL   Calcium 9.5  8.4 - 10.4 mg/dL   Anion Gap 11  3 - 11 mEq/L     Studies/Results:  No results found.  Medications: I have reviewed the patient's current  medications. We discussed SL ativan; she also has phenergan suppositories at home in nausea prevents po antiemetics. We discussed possible HA with zofran. She is instructed to increase laxatives as needed after chemo to keep bowels moving daily.   DISCUSSION: Patient and I agree that this chemo regimen will not be tolerable unless the side effects improve. She does want to be treated today, agrees to IVF on day 2 or day 3, and will call if problems as after cycle 1. I have cut gemzar dose by 50%, and told her that there would not be point in continuing this if even half dose is not tolerable.  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 Progression by marker and in bulky disease by CT. Began every other week gemzar 07-23-13, cycle 1 tolerated poorly. Cycle 2 today dose reduced and other plan as above. I will see her back prior to cycle 3 on 08-20-13. 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 BRCA testing ~ 2012 reportedly negative.  5.post remote hysterectomy  6. past tobacco, DCd 2003  7.PAC in  8.xerostomia: may be from ditropan, see above  9. NP cough and laryngitis 06-2013 resolved  10..Living will done     Patient has had questions answered to her satisfaction and is in agreement with plan. Chemo orders adjusted.   LIVESAY,LENNIS P, MD    08/06/2013, 1:40 PM

## 2013-08-06 NOTE — Patient Instructions (Signed)
We will try half dose of the gemzar chemotherapy today, and will give you IV fluids later this week.  Call if nausea is not controlled or you are not able to take fluids by mouth well.  If you are too nauseated to keep down compazine, you can use the ativan (lorazepam) under tongue every 4-6 hours OR phenergan suppository every 6 hours. Call if you do not have phenergan (promethazine) suppositories.  Try Ensure over ice cream or the juice supplements called Resource or Boost Breeze.   Please write down when you take meds and which ones, and bring list to next visit.  Be aware that zofran can cause headache in some people. Call us if you get headache.  Extra strength tylenol is fine for aches from gemzar.

## 2013-08-06 NOTE — Telephone Encounter (Signed)
, °

## 2013-08-06 NOTE — Patient Instructions (Addendum)
[facility] Discharge Instructions for Patients Receiving Chemotherapy  Today you received the following chemotherapy agents gemzar.  To help prevent nausea and vomiting after your treatment, we encourage you to take your nausea medication compazine 10 mg every 6 hours as needed. You can use ativan  Under tongue every 4-6 hours if unable to keep anything down. Use phenergan suppositories prn and call if you need a refill.   If you develop nausea and vomiting that is not controlled by your nausea medication, call the clinic.   BELOW ARE SYMPTOMS THAT SHOULD BE REPORTED IMMEDIATELY:  *FEVER GREATER THAN 100.5 F  *CHILLS WITH OR WITHOUT FEVER  NAUSEA AND VOMITING THAT IS NOT CONTROLLED WITH YOUR NAUSEA MEDICATION  *UNUSUAL SHORTNESS OF BREATH  *UNUSUAL BRUISING OR BLEEDING  TENDERNESS IN MOUTH AND THROAT WITH OR WITHOUT PRESENCE OF ULCERS  *URINARY PROBLEMS  *BOWEL PROBLEMS  UNUSUAL RASH Items with * indicate a potential emergency and should be followed up as soon as possible.  Feel free to call the clinic you have any questions or concerns. The clinic phone number is [phone].

## 2013-08-06 NOTE — Telephone Encounter (Signed)
Per staff message and POF I have scheduled appts. Advised scheduler of appts. I have gave the patient a calendar. JMW

## 2013-08-07 ENCOUNTER — Other Ambulatory Visit: Payer: Self-pay | Admitting: Medical Oncology

## 2013-08-07 ENCOUNTER — Ambulatory Visit (HOSPITAL_BASED_OUTPATIENT_CLINIC_OR_DEPARTMENT_OTHER): Payer: Medicare Other

## 2013-08-07 VITALS — BP 120/74 | HR 80 | Temp 97.9°F | Resp 19

## 2013-08-07 DIAGNOSIS — C569 Malignant neoplasm of unspecified ovary: Secondary | ICD-10-CM

## 2013-08-07 LAB — CA 125: CA 125: 226 U/mL — ABNORMAL HIGH (ref 0.0–30.2)

## 2013-08-07 MED ORDER — ONDANSETRON 8 MG/50ML IVPB (CHCC)
8.0000 mg | Freq: Once | INTRAVENOUS | Status: AC | PRN
  Administered 2013-08-07: 8 mg via INTRAVENOUS

## 2013-08-07 MED ORDER — SODIUM CHLORIDE 0.9 % IJ SOLN
10.0000 mL | INTRAMUSCULAR | Status: DC | PRN
  Administered 2013-08-07: 10 mL via INTRAVENOUS
  Filled 2013-08-07: qty 10

## 2013-08-07 MED ORDER — SODIUM CHLORIDE 0.9 % IV SOLN
500.0000 mL | Freq: Once | INTRAVENOUS | Status: AC
  Administered 2013-08-07: 1000 mL via INTRAVENOUS

## 2013-08-07 MED ORDER — HEPARIN SOD (PORK) LOCK FLUSH 100 UNIT/ML IV SOLN
500.0000 [IU] | Freq: Once | INTRAVENOUS | Status: AC
  Administered 2013-08-07: 500 [IU] via INTRAVENOUS
  Filled 2013-08-07: qty 5

## 2013-08-07 MED ORDER — ONDANSETRON 8 MG/NS 50 ML IVPB
INTRAVENOUS | Status: AC
  Filled 2013-08-07: qty 8

## 2013-08-07 NOTE — Patient Instructions (Signed)
Dehydration, Adult Dehydration is when you lose more fluids from the body than you take in. Vital organs like the kidneys, brain, and heart cannot function without a proper amount of fluids and salt. Any loss of fluids from the body can cause dehydration.  CAUSES   Vomiting.  Diarrhea.  Excessive sweating.  Excessive urine output.  Fever. SYMPTOMS  Mild dehydration  Thirst.  Dry lips.  Slightly dry mouth. Moderate dehydration  Very dry mouth.  Sunken eyes.  Skin does not bounce back quickly when lightly pinched and released.  Dark urine and decreased urine production.  Decreased tear production.  Headache. Severe dehydration  Very dry mouth.  Extreme thirst.  Rapid, weak pulse (more than 100 beats per minute at rest).  Cold hands and feet.  Not able to sweat in spite of heat and temperature.  Rapid breathing.  Blue lips.  Confusion and lethargy.  Difficulty being awakened.  Minimal urine production.  No tears. DIAGNOSIS  Your caregiver will diagnose dehydration based on your symptoms and your exam. Blood and urine tests will help confirm the diagnosis. The diagnostic evaluation should also identify the cause of dehydration. TREATMENT  Treatment of mild or moderate dehydration can often be done at home by increasing the amount of fluids that you drink. It is best to drink small amounts of fluid more often. Drinking too much at one time can make vomiting worse. Refer to the home care instructions below. Severe dehydration needs to be treated at the hospital where you will probably be given intravenous (IV) fluids that contain water and electrolytes. HOME CARE INSTRUCTIONS   Ask your caregiver about specific rehydration instructions.  Drink enough fluids to keep your urine clear or pale yellow.  Drink small amounts frequently if you have nausea and vomiting.  Eat as you normally do.  Avoid:  Foods or drinks high in sugar.  Carbonated  drinks.  Juice.  Extremely hot or cold fluids.  Drinks with caffeine.  Fatty, greasy foods.  Alcohol.  Tobacco.  Overeating.  Gelatin desserts.  Wash your hands well to avoid spreading bacteria and viruses.  Only take over-the-counter or prescription medicines for pain, discomfort, or fever as directed by your caregiver.  Ask your caregiver if you should continue all prescribed and over-the-counter medicines.  Keep all follow-up appointments with your caregiver. SEEK MEDICAL CARE IF:  You have abdominal pain and it increases or stays in one area (localizes).  You have a rash, stiff neck, or severe headache.  You are irritable, sleepy, or difficult to awaken.  You are weak, dizzy, or extremely thirsty. SEEK IMMEDIATE MEDICAL CARE IF:   You are unable to keep fluids down or you get worse despite treatment.  You have frequent episodes of vomiting or diarrhea.  You have blood or green matter (bile) in your vomit.  You have blood in your stool or your stool looks black and tarry.  You have not urinated in 6 to 8 hours, or you have only urinated a small amount of very dark urine.  You have a fever.  You faint. MAKE SURE YOU:   Understand these instructions.  Will watch your condition.  Will get help right away if you are not doing well or get worse. Document Released: 02/01/2005 Document Revised: 04/26/2011 Document Reviewed: 09/21/2010 [employer] Patient Information 2015 [employer]. This information is not intended to replace advice given to you by your health care Sedona Wenk. Make sure you discuss any questions you have with your health care   Trai Ells.  Nausea, Adult Nausea is the feeling that you have an upset stomach or have to vomit. Nausea by itself is not likely a serious concern, but it may be an early sign of more serious medical problems. As nausea gets worse, it can lead to vomiting. If vomiting develops, there is the risk of dehydration.  CAUSES    Viral infections.  Food poisoning.  Medicines.  Pregnancy.  Motion sickness.  Migraine headaches.  Emotional distress.  Severe pain from any source.  Alcohol intoxication. HOME CARE INSTRUCTIONS  Get plenty of rest.  Ask your caregiver about specific rehydration instructions.  Eat small amounts of food and sip liquids more often.  Take all medicines as told by your caregiver. SEEK MEDICAL CARE IF:  You have not improved after 2 days, or you get worse.  You have a headache. SEEK IMMEDIATE MEDICAL CARE IF:   You have a fever.  You faint.  You keep vomiting or have blood in your vomit.  You are extremely weak or dehydrated.  You have dark or bloody stools.  You have severe chest or abdominal pain. MAKE SURE YOU:  Understand these instructions.  Will watch your condition.  Will get help right away if you are not doing well or get worse. Document Released: 03/11/2004 Document Revised: 10/27/2011 Document Reviewed: 10/14/2010 [facility] Patient Information 2015 [place], [place]. This information is not intended to replace advice given to you by your health care Jamile Sivils. Make sure you discuss any questions you have with your health care Desira Alessandrini.

## 2013-08-10 ENCOUNTER — Ambulatory Visit (HOSPITAL_BASED_OUTPATIENT_CLINIC_OR_DEPARTMENT_OTHER): Payer: Medicare Other

## 2013-08-10 ENCOUNTER — Ambulatory Visit: Payer: Medicare Other

## 2013-08-10 ENCOUNTER — Other Ambulatory Visit: Payer: Self-pay | Admitting: *Deleted

## 2013-08-10 ENCOUNTER — Other Ambulatory Visit: Payer: Self-pay

## 2013-08-10 VITALS — BP 134/84 | HR 92 | Temp 97.9°F | Resp 18

## 2013-08-10 DIAGNOSIS — C50919 Malignant neoplasm of unspecified site of unspecified female breast: Secondary | ICD-10-CM

## 2013-08-10 DIAGNOSIS — R112 Nausea with vomiting, unspecified: Secondary | ICD-10-CM

## 2013-08-10 DIAGNOSIS — Z95828 Presence of other vascular implants and grafts: Secondary | ICD-10-CM

## 2013-08-10 DIAGNOSIS — C569 Malignant neoplasm of unspecified ovary: Secondary | ICD-10-CM

## 2013-08-10 LAB — BASIC METABOLIC PANEL (CC13)
Anion Gap: 12 mEq/L — ABNORMAL HIGH (ref 3–11)
BUN: 15.1 mg/dL (ref 7.0–26.0)
CHLORIDE: 102 meq/L (ref 98–109)
CO2: 23 mEq/L (ref 22–29)
Calcium: 9.6 mg/dL (ref 8.4–10.4)
Creatinine: 0.8 mg/dL (ref 0.6–1.1)
Glucose: 107 mg/dl (ref 70–140)
POTASSIUM: 4.4 meq/L (ref 3.5–5.1)
Sodium: 136 mEq/L (ref 136–145)

## 2013-08-10 MED ORDER — ONDANSETRON 16 MG/50ML IVPB (CHCC)
INTRAVENOUS | Status: AC
  Filled 2013-08-10: qty 16

## 2013-08-10 MED ORDER — HEPARIN SOD (PORK) LOCK FLUSH 100 UNIT/ML IV SOLN
500.0000 [IU] | Freq: Once | INTRAVENOUS | Status: AC
  Administered 2013-08-10: 500 [IU] via INTRAVENOUS
  Filled 2013-08-10: qty 5

## 2013-08-10 MED ORDER — SODIUM CHLORIDE 0.9 % IJ SOLN
10.0000 mL | INTRAMUSCULAR | Status: DC | PRN
  Administered 2013-08-10: 10 mL via INTRAVENOUS
  Filled 2013-08-10: qty 10

## 2013-08-10 MED ORDER — ONDANSETRON 16 MG/50ML IVPB (CHCC)
16.0000 mg | Freq: Once | INTRAVENOUS | Status: AC
  Administered 2013-08-10: 16 mg via INTRAVENOUS

## 2013-08-10 MED ORDER — SODIUM CHLORIDE 0.9 % IJ SOLN
10.0000 mL | INTRAMUSCULAR | Status: AC | PRN
  Administered 2013-08-10: 10 mL via INTRAVENOUS
  Filled 2013-08-10: qty 10

## 2013-08-10 MED ORDER — SODIUM CHLORIDE 0.9 % IV SOLN
Freq: Once | INTRAVENOUS | Status: AC
  Administered 2013-08-10: 11:00:00 via INTRAVENOUS

## 2013-08-10 MED ORDER — ONDANSETRON 8 MG PO TBDP: 8.0000 mg | ORAL_TABLET | Freq: Three times a day (TID) | ORAL | Status: DC | PRN

## 2013-08-10 NOTE — Progress Notes (Signed)
[name] [name] called stating that she has been vomiting several times a day even with using antiemetic suppositories.  She will come in today at 1030  per Dr. Marko Plume for B-met and IVF and antiemetics and assessment.

## 2013-08-10 NOTE — Patient Instructions (Signed)
Dehydration, Adult Dehydration is when you lose more fluids from the body than you take in. Vital organs like the kidneys, brain, and heart cannot function without a proper amount of fluids and salt. Any loss of fluids from the body can cause dehydration.  CAUSES   Vomiting.  Diarrhea.  Excessive sweating.  Excessive urine output.  Fever. SYMPTOMS  Mild dehydration  Thirst.  Dry lips.  Slightly dry mouth. Moderate dehydration  Very dry mouth.  Sunken eyes.  Skin does not bounce back quickly when lightly pinched and released.  Dark urine and decreased urine production.  Decreased tear production.  Headache. Severe dehydration  Very dry mouth.  Extreme thirst.  Rapid, weak pulse (more than 100 beats per minute at rest).  Cold hands and feet.  Not able to sweat in spite of heat and temperature.  Rapid breathing.  Blue lips.  Confusion and lethargy.  Difficulty being awakened.  Minimal urine production.  No tears. DIAGNOSIS  Your caregiver will diagnose dehydration based on your symptoms and your exam. Blood and urine tests will help confirm the diagnosis. The diagnostic evaluation should also identify the cause of dehydration. TREATMENT  Treatment of mild or moderate dehydration can often be done at home by increasing the amount of fluids that you drink. It is best to drink small amounts of fluid more often. Drinking too much at one time can make vomiting worse. Refer to the home care instructions below. Severe dehydration needs to be treated at the hospital where you will probably be given intravenous (IV) fluids that contain water and electrolytes. HOME CARE INSTRUCTIONS   Ask your caregiver about specific rehydration instructions.  Drink enough fluids to keep your urine clear or pale yellow.  Drink small amounts frequently if you have nausea and vomiting.  Eat as you normally do.  Avoid:  Foods or drinks high in sugar.  Carbonated  drinks.  Juice.  Extremely hot or cold fluids.  Drinks with caffeine.  Fatty, greasy foods.  Alcohol.  Tobacco.  Overeating.  Gelatin desserts.  Wash your hands well to avoid spreading bacteria and viruses.  Only take over-the-counter or prescription medicines for pain, discomfort, or fever as directed by your caregiver.  Ask your caregiver if you should continue all prescribed and over-the-counter medicines.  Keep all follow-up appointments with your caregiver. SEEK MEDICAL CARE IF:  You have abdominal pain and it increases or stays in one area (localizes).  You have a rash, stiff neck, or severe headache.  You are irritable, sleepy, or difficult to awaken.  You are weak, dizzy, or extremely thirsty. SEEK IMMEDIATE MEDICAL CARE IF:   You are unable to keep fluids down or you get worse despite treatment.  You have frequent episodes of vomiting or diarrhea.  You have blood or green matter (bile) in your vomit.  You have blood in your stool or your stool looks black and tarry.  You have not urinated in 6 to 8 hours, or you have only urinated a small amount of very dark urine.  You have a fever.  You faint. MAKE SURE YOU:   Understand these instructions.  Will watch your condition.  Will get help right away if you are not doing well or get worse. Document Released: 02/01/2005 Document Revised: 04/26/2011 Document Reviewed: 09/21/2010 Exit[employer] Patient Information 2015 [employer]. This information is not intended to replace advice given to you by your health care provider. Make sure you discuss any questions you have with your health care   provider.  

## 2013-08-10 NOTE — Progress Notes (Unsigned)
Patient is having difficulty with nausea at home and here.  She has received her IV zofran.  She would like to try aromatherapy.  Offered several different essential oils.  She opted for ginger-orange - used aromatically @ 11:40am.

## 2013-08-14 ENCOUNTER — Other Ambulatory Visit: Payer: Self-pay | Admitting: Cardiovascular Disease

## 2013-08-14 ENCOUNTER — Other Ambulatory Visit: Payer: Self-pay | Admitting: *Deleted

## 2013-08-14 DIAGNOSIS — C569 Malignant neoplasm of unspecified ovary: Secondary | ICD-10-CM

## 2013-08-14 MED ORDER — OMEPRAZOLE 20 MG PO CPDR: 20.0000 mg | DELAYED_RELEASE_CAPSULE | Freq: Two times a day (BID) | ORAL | Status: DC

## 2013-08-19 ENCOUNTER — Other Ambulatory Visit: Payer: Self-pay | Admitting: Oncology

## 2013-08-20 ENCOUNTER — Ambulatory Visit (HOSPITAL_BASED_OUTPATIENT_CLINIC_OR_DEPARTMENT_OTHER): Payer: Medicare Other

## 2013-08-20 ENCOUNTER — Telehealth: Payer: Self-pay | Admitting: Oncology

## 2013-08-20 ENCOUNTER — Other Ambulatory Visit: Payer: Self-pay | Admitting: *Deleted

## 2013-08-20 ENCOUNTER — Ambulatory Visit (HOSPITAL_BASED_OUTPATIENT_CLINIC_OR_DEPARTMENT_OTHER): Payer: Medicare Other | Admitting: Oncology

## 2013-08-20 ENCOUNTER — Telehealth: Payer: Self-pay | Admitting: *Deleted

## 2013-08-20 ENCOUNTER — Other Ambulatory Visit (HOSPITAL_BASED_OUTPATIENT_CLINIC_OR_DEPARTMENT_OTHER): Payer: Medicare Other

## 2013-08-20 ENCOUNTER — Encounter: Payer: Self-pay | Admitting: Oncology

## 2013-08-20 VITALS — BP 118/72 | HR 63

## 2013-08-20 VITALS — BP 126/74 | HR 73 | Temp 98.0°F | Resp 18 | Ht 67.0 in | Wt 146.1 lb

## 2013-08-20 DIAGNOSIS — C801 Malignant (primary) neoplasm, unspecified: Secondary | ICD-10-CM

## 2013-08-20 DIAGNOSIS — C569 Malignant neoplasm of unspecified ovary: Secondary | ICD-10-CM

## 2013-08-20 DIAGNOSIS — R5381 Other malaise: Secondary | ICD-10-CM

## 2013-08-20 DIAGNOSIS — K117 Disturbances of salivary secretion: Secondary | ICD-10-CM

## 2013-08-20 DIAGNOSIS — R5383 Other fatigue: Secondary | ICD-10-CM

## 2013-08-20 DIAGNOSIS — R112 Nausea with vomiting, unspecified: Secondary | ICD-10-CM

## 2013-08-20 DIAGNOSIS — J910 Malignant pleural effusion: Secondary | ICD-10-CM

## 2013-08-20 DIAGNOSIS — Z5189 Encounter for other specified aftercare: Secondary | ICD-10-CM

## 2013-08-20 DIAGNOSIS — G5790 Unspecified mononeuropathy of unspecified lower limb: Secondary | ICD-10-CM

## 2013-08-20 LAB — CBC WITH DIFFERENTIAL/PLATELET
BASO%: 0.1 % (ref 0.0–2.0)
Basophils Absolute: 0 10*3/uL (ref 0.0–0.1)
EOS ABS: 0 10*3/uL (ref 0.0–0.5)
EOS%: 0.5 % (ref 0.0–7.0)
HCT: 35.9 % (ref 34.8–46.6)
HGB: 11.1 g/dL — ABNORMAL LOW (ref 11.6–15.9)
LYMPH%: 8.2 % — AB (ref 14.0–49.7)
MCH: 30.1 pg (ref 25.1–34.0)
MCHC: 30.9 g/dL — AB (ref 31.5–36.0)
MCV: 97.3 fL (ref 79.5–101.0)
MONO#: 0.9 10*3/uL (ref 0.1–0.9)
MONO%: 11.1 % (ref 0.0–14.0)
NEUT%: 80.1 % — ABNORMAL HIGH (ref 38.4–76.8)
NEUTROS ABS: 6.6 10*3/uL — AB (ref 1.5–6.5)
Platelets: 505 10*3/uL — ABNORMAL HIGH (ref 145–400)
RBC: 3.69 10*6/uL — ABNORMAL LOW (ref 3.70–5.45)
RDW: 21.8 % — ABNORMAL HIGH (ref 11.2–14.5)
WBC: 8.2 10*3/uL (ref 3.9–10.3)
lymph#: 0.7 10*3/uL — ABNORMAL LOW (ref 0.9–3.3)
nRBC: 1 % — ABNORMAL HIGH (ref 0–0)

## 2013-08-20 LAB — COMPREHENSIVE METABOLIC PANEL (CC13)
ALT: 58 U/L — ABNORMAL HIGH (ref 0–55)
ANION GAP: 12 meq/L — AB (ref 3–11)
AST: 68 U/L — ABNORMAL HIGH (ref 5–34)
Albumin: 3.2 g/dL — ABNORMAL LOW (ref 3.5–5.0)
Alkaline Phosphatase: 214 U/L — ABNORMAL HIGH (ref 40–150)
BUN: 21.1 mg/dL (ref 7.0–26.0)
CALCIUM: 9.5 mg/dL (ref 8.4–10.4)
CO2: 22 meq/L (ref 22–29)
CREATININE: 0.9 mg/dL (ref 0.6–1.1)
Chloride: 106 mEq/L (ref 98–109)
Glucose: 122 mg/dl (ref 70–140)
Potassium: 4.3 mEq/L (ref 3.5–5.1)
Sodium: 139 mEq/L (ref 136–145)
Total Bilirubin: 0.6 mg/dL (ref 0.20–1.20)
Total Protein: 7.2 g/dL (ref 6.4–8.3)

## 2013-08-20 MED ORDER — ONDANSETRON 16 MG/50ML IVPB (CHCC)
16.0000 mg | Freq: Once | INTRAVENOUS | Status: AC
  Administered 2013-08-20: 16 mg via INTRAVENOUS

## 2013-08-20 MED ORDER — SODIUM CHLORIDE 0.9 % IV SOLN
Freq: Once | INTRAVENOUS | Status: AC
  Administered 2013-08-20: 14:00:00 via INTRAVENOUS

## 2013-08-20 MED ORDER — HEPARIN SOD (PORK) LOCK FLUSH 100 UNIT/ML IV SOLN
500.0000 [IU] | Freq: Once | INTRAVENOUS | Status: AC
  Administered 2013-08-20: 500 [IU] via INTRAVENOUS
  Filled 2013-08-20: qty 5

## 2013-08-20 MED ORDER — SODIUM CHLORIDE 0.9 % IJ SOLN
10.0000 mL | INTRAMUSCULAR | Status: DC | PRN
  Administered 2013-08-20: 10 mL via INTRAVENOUS
  Filled 2013-08-20: qty 10

## 2013-08-20 MED ORDER — LORAZEPAM 2 MG/ML IJ SOLN
0.5000 mg | Freq: Once | INTRAMUSCULAR | Status: AC | PRN
  Administered 2013-08-20: 0.5 mg via INTRAVENOUS

## 2013-08-20 MED ORDER — ONDANSETRON 16 MG/50ML IVPB (CHCC)
INTRAVENOUS | Status: AC
  Filled 2013-08-20: qty 16

## 2013-08-20 MED ORDER — LORAZEPAM 2 MG/ML IJ SOLN
INTRAMUSCULAR | Status: AC
  Filled 2013-08-20: qty 1

## 2013-08-20 NOTE — Telephone Encounter (Signed)
Per staff message and POF I have scheduled appts. Advised scheduler of appts. JMW  

## 2013-08-20 NOTE — Patient Instructions (Signed)

## 2013-08-20 NOTE — Telephone Encounter (Signed)
, °

## 2013-08-20 NOTE — Progress Notes (Signed)
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 Treatment was changed to every other week gemzar, cycle 1 given 6-8 and tolerated poorly (aches, nausea, fatigue) and cycle 2 given at 50% dose on 08-06-13. She has felt even more poorly since the second cycle, with marked fatigue, nausea and vomiting, weakness and poor po intake. She had IVF and IV antiemetics on 6-23 and 6-26, which were briefly helpful. Compazine has not helped nausea and she does not seem to have used much po zofran; SL ativan has helped symptoms. Patient tells me that she will not take more gemzar, and certainly I am in agreement. Patient is in tears at times during visit and husband seems overwhelmed.  She has PAC.   ONCOLOGIC HISTORY   Ovarian cancer (Resolved)   01/06/2012 Initial Diagnosis Ovarian cancer   01/07/2012 - 06/23/2012 Chemotherapy 6 cycles of paciltaxel and carboplatin   05/23/2012 Surgery x-lap with significant residual disease   07/24/2012 - 12/18/2012 Chemotherapy 6 cycles of topotecan and avastin    Ovarian cancer   12/29/2011 Initial Diagnosis Ovarian cancer, IV based on thoracentesis    - 04/21/2012 Chemotherapy 6 cycles of paclitaxel and carboplatin   05/30/2012 Surgery Suboptimal debulking   07/24/2012 - 04/16/2013 Chemotherapy 10 cycles of topotecan and avastin  Patient had 3 sentinel biopsied all of them were negative for metastatic disease. Patient underwent radiation therapy between 06/05/2009 through 07/02/2009. She was then begun on Tamoxifen 20 mg daily in 09/2009. BRCA testing reportedly negative in ~ 2012.  #2 In 12/2011 she received the diagnosis of gynecologic malignancy presenting with  abdominal mass and pleural effusions. Patient was originally hospitalized with shortness of breath in 12/2011. 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 She also had malignant ascites. She underwent bilateral thoracentesis and paracentesis procedures during her hospitalization. During her hospitalization she was seen by gynecologic oncology.  #3 Patient began neoadjuvant chemotherapy consisting of Taxol and Carboplatinum. Her first cycle was administered during her hospitalization. She completed 6 cycles of Taxol/Carboplatinum from 01/07/2012 - 06/23/2012. Per patient "very sick with initial chemo and it did not work"  #4. laparotomy in 05/2012 that revealed significant residual disease.  #5 Patient began adjuvant therapy with Topotecan/Avastin starting on 07/24/2012, given thru 06-04-2013. Progression by CA125 marker and CT May 2015. Gemzar began 07-23-13; cycle 2 on 08-06-13 was dose reduced by 50% due to poor tolerance.   Review of systems as above, also: No fever or symptoms of infection. Able to eat squash casserole from neighbor and drink water only. Bowels have moved. No bleeding. No increased or productive cough. No pain.No LE swelling. GERD controlled with prilosec. Remainder of 10 point Review of Systems negative.  Objective:  Vital signs in last 24 hours:  BP 126/74  Pulse 73  Temp(Src) 98 F (36.7 C) (Oral)  Resp 18  Ht 5\' 7"  (1.702 m)  Wt 146 lb 1.6 oz (66.271 kg)  BMI 22.88 kg/m2 weight is down 2 lbs  Alert, oriented and appropriate. Ambulatory slowly, agrees to wheelchair in office. Looks uncomfortable, weak, pale, mouth extremely dry Partial alopecia  HEENT:PERRL, sclerae not icteric. Oral mucosa moist without lesions, posterior pharynx clear.  Neck  supple. No JVD.  Lymphatics:no cervical,suraclavicular adenopathy Resp: diminished BS thruout otherwise clear to auscultation bilaterally and normal  percussion bilaterally Cardio: regular rate and rhythm. No gallop. Clear heart sounds GI: soft, nontender, mildly more distended, no appreciable mass or organomegaly. Normally active bowel sounds. Surgical incision not remarkable. Musculoskeletal/ Extremities: without pitting edema, cords, tenderness Neuro nonfocal Skin without rash, ecchymosis, petechiae  Portacath-without erythema or tenderness  Lab Results:  Results for orders placed in visit on 08/20/13  CBC WITH DIFFERENTIAL      Result Value Ref Range   WBC 8.2  3.9 - 10.3 10e3/uL   NEUT# 6.6 (*) 1.5 - 6.5 10e3/uL   HGB 11.1 (*) 11.6 - 15.9 g/dL   HCT 35.9  34.8 - 46.6 %   Platelets 505 (*) 145 - 400 10e3/uL   MCV 97.3  79.5 - 101.0 fL   MCH 30.1  25.1 - 34.0 pg   MCHC 30.9 (*) 31.5 - 36.0 g/dL   RBC 3.69 (*) 3.70 - 5.45 10e6/uL   RDW 21.8 (*) 11.2 - 14.5 %   lymph# 0.7 (*) 0.9 - 3.3 10e3/uL   MONO# 0.9  0.1 - 0.9 10e3/uL   Eosinophils Absolute 0.0  0.0 - 0.5 10e3/uL   Basophils Absolute 0.0  0.0 - 0.1 10e3/uL   NEUT% 80.1 (*) 38.4 - 76.8 %   LYMPH% 8.2 (*) 14.0 - 49.7 %   MONO% 11.1  0.0 - 14.0 %   EOS% 0.5  0.0 - 7.0 %   BASO% 0.1  0.0 - 2.0 %   nRBC 1 (*) 0 - 0 %  COMPREHENSIVE METABOLIC PANEL (QX45)      Result Value Ref Range   Sodium 139  136 - 145 mEq/L   Potassium 4.3  3.5 - 5.1 mEq/L   Chloride 106  98 - 109 mEq/L   CO2 22  22 - 29 mEq/L   Glucose 122  70 - 140 mg/dl   BUN 21.1  7.0 - 26.0 mg/dL   Creatinine 0.9  0.6 - 1.1 mg/dL   Total Bilirubin 0.60  0.20 - 1.20 mg/dL   Alkaline Phosphatase 214 (*) 40 - 150 U/L   AST 68 (*) 5 - 34 U/L   ALT 58 (*) 0 - 55 U/L   Total Protein 7.2  6.4 - 8.3 g/dL   Albumin 3.2 (*) 3.5 - 5.0 g/dL   Calcium 9.5  8.4 - 10.4 mg/dL   Anion Gap 12 (*) 3 - 11 mEq/L   CA 125 in Jan 2014 was 68, in May 131,  on 08-06-13 was 226 and available after visit today was 246.  Studies/Results:  No results found.  Medications: I have reviewed the patient's current medications with  patient and husband together, including generic and trade names (as this seems to be some of medication confusion at home). I have asked that she bring all medications to subsequent visits.She will have zofran 16 mg IV with IVF today.  I have given written and oral instructions to take zofran in AM 08-21-13 before getting up, then every 8 hours RTC until I see her back on 08-24-13. She will take Ativan tonight at 1800 then q 6 hrs RTC until bedtime on 7-7, then q 6 hr prn. She does not need to continue lipitor. She feels that gabapentin 100 mg is still needed for residual LE neuropathy related to initial taxol. She should continue prilosec. SHe tells me that cystitis symptoms recur rapidly if she holds ditropan  or trimethoprin.  DISCUSSION: I have reminded them to call if questions or problems prior to next visit on 08-24-13. We have discussed difficulty distinguishing between symptoms from progressive cancer vs from the different chemotherapy. We will consider change to another chemotherapy regimen when I see her next, possibly Alimta. I have asked financial staff to look into coverage for Alimta and hopefully will know about that by 7-10. They are aware not to take xanax with the regular ativan.    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 Progression by marker and in bulky disease by CT. Began every other week gemzar 07-23-13, tolerated poorly even with 50% dose reduction for cycle 2 on 08-06-13. We will not continue gemzar. She will have IVF with IV antiemetics today and see how she is a little further out from chemo at return visit on 08-24-13.  2.patient and husband having  difficult time with worsening of condition now. I will ask support staff to be in touch. She tells me that Effexor is helpful and that she has occasionally used xanax to relax. We have discussed ativan for nausea and anxiety as above. I do not believe husband has been assisting with medications until now, and he is overwhelmed. 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 BRCA testing ~ 2012 reportedly negative.  5.post remote hysterectomy  6. 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       Shenandoah Yeats P, MD   08/20/2013, 3:24 PM

## 2013-08-21 ENCOUNTER — Other Ambulatory Visit: Payer: Self-pay | Admitting: Oncology

## 2013-08-21 DIAGNOSIS — C569 Malignant neoplasm of unspecified ovary: Secondary | ICD-10-CM

## 2013-08-21 LAB — CA 125: CA 125: 246.1 U/mL — ABNORMAL HIGH (ref 0.0–30.2)

## 2013-08-22 ENCOUNTER — Encounter: Payer: Self-pay | Admitting: General Practice

## 2013-08-22 NOTE — Progress Notes (Signed)
Referred by Dr Marko Plume via her RN for emotional and spiritual support for [name] [name], as she was visibly upset and tearful. Made lengthy visit (ca 1 hour), providing pastoral presence and reflective listening as she explored her fears of dying and reasons for living.  She is accustomed to being very active and socially engaged.  [name] [name] is actively working to plan her funeral, as a gift to her family and a healthy means of personal reflection.  She is afraid of dying before her granddaughter gets old enough to remember her.  [name] dearly wants more time to spend with Clyde Canterbury (age 53), and she works on many projects [employer].  Ability to engage in family time and meaningful personal activities are top priorities for her.  She welcomes further chaplain support.   [place], [place]

## 2013-08-24 ENCOUNTER — Other Ambulatory Visit (HOSPITAL_BASED_OUTPATIENT_CLINIC_OR_DEPARTMENT_OTHER): Payer: Medicare Other

## 2013-08-24 ENCOUNTER — Ambulatory Visit: Payer: Medicare Other

## 2013-08-24 ENCOUNTER — Encounter: Payer: Self-pay | Admitting: Oncology

## 2013-08-24 ENCOUNTER — Telehealth: Payer: Self-pay | Admitting: *Deleted

## 2013-08-24 ENCOUNTER — Ambulatory Visit (HOSPITAL_BASED_OUTPATIENT_CLINIC_OR_DEPARTMENT_OTHER): Payer: Medicare Other | Admitting: Oncology

## 2013-08-24 ENCOUNTER — Other Ambulatory Visit: Payer: Self-pay | Admitting: Oncology

## 2013-08-24 ENCOUNTER — Ambulatory Visit (HOSPITAL_BASED_OUTPATIENT_CLINIC_OR_DEPARTMENT_OTHER): Payer: Medicare Other

## 2013-08-24 VITALS — BP 125/75 | HR 74 | Temp 98.2°F | Resp 16 | Ht 67.0 in | Wt 149.7 lb

## 2013-08-24 DIAGNOSIS — R5381 Other malaise: Secondary | ICD-10-CM

## 2013-08-24 DIAGNOSIS — T451X5A Adverse effect of antineoplastic and immunosuppressive drugs, initial encounter: Secondary | ICD-10-CM

## 2013-08-24 DIAGNOSIS — C569 Malignant neoplasm of unspecified ovary: Secondary | ICD-10-CM

## 2013-08-24 DIAGNOSIS — C801 Malignant (primary) neoplasm, unspecified: Secondary | ICD-10-CM

## 2013-08-24 DIAGNOSIS — K117 Disturbances of salivary secretion: Secondary | ICD-10-CM

## 2013-08-24 DIAGNOSIS — I2510 Atherosclerotic heart disease of native coronary artery without angina pectoris: Secondary | ICD-10-CM

## 2013-08-24 DIAGNOSIS — R0609 Other forms of dyspnea: Secondary | ICD-10-CM

## 2013-08-24 DIAGNOSIS — R5383 Other fatigue: Secondary | ICD-10-CM

## 2013-08-24 DIAGNOSIS — F411 Generalized anxiety disorder: Secondary | ICD-10-CM

## 2013-08-24 DIAGNOSIS — R0989 Other specified symptoms and signs involving the circulatory and respiratory systems: Secondary | ICD-10-CM

## 2013-08-24 DIAGNOSIS — G622 Polyneuropathy due to other toxic agents: Secondary | ICD-10-CM

## 2013-08-24 LAB — CBC WITH DIFFERENTIAL/PLATELET
BASO%: 0.3 % (ref 0.0–2.0)
Basophils Absolute: 0 10*3/uL (ref 0.0–0.1)
EOS%: 0.7 % (ref 0.0–7.0)
Eosinophils Absolute: 0.1 10*3/uL (ref 0.0–0.5)
HEMATOCRIT: 33.7 % — AB (ref 34.8–46.6)
HEMOGLOBIN: 10.2 g/dL — AB (ref 11.6–15.9)
LYMPH#: 0.9 10*3/uL (ref 0.9–3.3)
LYMPH%: 8.9 % — AB (ref 14.0–49.7)
MCH: 29.7 pg (ref 25.1–34.0)
MCHC: 30.3 g/dL — AB (ref 31.5–36.0)
MCV: 98.3 fL (ref 79.5–101.0)
MONO#: 1 10*3/uL — AB (ref 0.1–0.9)
MONO%: 9.7 % (ref 0.0–14.0)
NEUT#: 8 10*3/uL — ABNORMAL HIGH (ref 1.5–6.5)
NEUT%: 80.4 % — AB (ref 38.4–76.8)
PLATELETS: 562 10*3/uL — AB (ref 145–400)
RBC: 3.43 10*6/uL — ABNORMAL LOW (ref 3.70–5.45)
RDW: 22.4 % — ABNORMAL HIGH (ref 11.2–14.5)
WBC: 10 10*3/uL (ref 3.9–10.3)

## 2013-08-24 LAB — COMPREHENSIVE METABOLIC PANEL (CC13)
ALT: 31 U/L (ref 0–55)
ANION GAP: 12 meq/L — AB (ref 3–11)
AST: 35 U/L — ABNORMAL HIGH (ref 5–34)
Albumin: 3 g/dL — ABNORMAL LOW (ref 3.5–5.0)
Alkaline Phosphatase: 191 U/L — ABNORMAL HIGH (ref 40–150)
BILIRUBIN TOTAL: 0.48 mg/dL (ref 0.20–1.20)
BUN: 19.1 mg/dL (ref 7.0–26.0)
CHLORIDE: 106 meq/L (ref 98–109)
CO2: 21 meq/L — AB (ref 22–29)
CREATININE: 0.9 mg/dL (ref 0.6–1.1)
Calcium: 9.2 mg/dL (ref 8.4–10.4)
Glucose: 106 mg/dl (ref 70–140)
Potassium: 4.3 mEq/L (ref 3.5–5.1)
Sodium: 139 mEq/L (ref 136–145)
Total Protein: 6.7 g/dL (ref 6.4–8.3)

## 2013-08-24 MED ORDER — LORAZEPAM 2 MG/ML IJ SOLN
INTRAMUSCULAR | Status: AC
  Filled 2013-08-24: qty 1

## 2013-08-24 MED ORDER — SODIUM CHLORIDE 0.9 % IJ SOLN
10.0000 mL | INTRAMUSCULAR | Status: DC | PRN
  Administered 2013-08-24: 10 mL via INTRAVENOUS
  Filled 2013-08-24: qty 10

## 2013-08-24 MED ORDER — SODIUM CHLORIDE 0.9 % IV SOLN
INTRAVENOUS | Status: DC
  Administered 2013-08-24: 16:00:00 via INTRAVENOUS

## 2013-08-24 MED ORDER — LORAZEPAM 2 MG/ML IJ SOLN
0.5000 mg | Freq: Once | INTRAMUSCULAR | Status: AC
  Administered 2013-08-24: 0.5 mg via INTRAVENOUS

## 2013-08-24 MED ORDER — HEPARIN SOD (PORK) LOCK FLUSH 100 UNIT/ML IV SOLN
500.0000 [IU] | Freq: Once | INTRAVENOUS | Status: AC
  Administered 2013-08-24: 500 [IU] via INTRAVENOUS
  Filled 2013-08-24: qty 5

## 2013-08-24 NOTE — Progress Notes (Signed)
1618- Dr. Marko Plume called giving orders for oxygen 2 liters during her infusion.

## 2013-08-24 NOTE — Progress Notes (Signed)
OFFICE PROGRESS NOTE   08/24/2013   Physicians:E.Knapp (PCP), Nancy Marus, Rolm Bookbinder, Marlinda Mike, (B.Bartle), Clois Comber, Karl Stonecipher   INTERVAL HISTORY:  Patient is seen, together with husband, in continuing attention to progressive high grade serous gyn carcinoma, recently progressive and patient unable to tolerate gemzar even with 50% dose reduction with second cycle on 08-06-13. Last IVF was 08-20-13. Since I saw her on 08-20-13, nausea has been better controlled (used ativan q 6 hr x 24 hrs and zofran q 8 hrs RTC since last visit, as instructed) and po intake is some better. She is still very weak, SOB with activity such that she has to sit down after brief exertion, and collapsed while standing in bathroom two days ago, without injury. Bowels still are not moving adequately.  They have brought all medication bottles, still having a difficult time managing these. I have gone thru all of these, listed and separated the ones that she needs to take regularly, and color coded pink "as needed" and orange for those that will not be used presently.  She tells me that recent [facility] chaplain support visit was not helpful, tho this was not the impression that the chaplain understood.   She has PAC.   ONCOLOGIC HISTORY   Ovarian cancer (Resolved)   01/06/2012 Initial Diagnosis Ovarian cancer   01/07/2012 - 06/23/2012 Chemotherapy 6 cycles of paciltaxel and carboplatin   05/23/2012 Surgery x-lap with significant residual disease   07/24/2012 - 12/18/2012 Chemotherapy 6 cycles of topotecan and avastin    Ovarian cancer   12/29/2011 Initial Diagnosis Ovarian cancer, IV based on thoracentesis    - 04/21/2012 Chemotherapy 6 cycles of paclitaxel and carboplatin   05/30/2012 Surgery Suboptimal debulking   07/24/2012 - 04/16/2013 Chemotherapy 10 cycles of topotecan and avastin  Patient had 3 sentinel biopsied all of them were negative for metastatic disease. Patient underwent radiation  therapy between 06/05/2009 through 07/02/2009. She was then begun on Tamoxifen 20 mg daily in 09/2009. BRCA testing reportedly negative in ~ 2012.  #2 In 12/2011 she received the diagnosis of gynecologic malignancy presenting with abdominal mass and pleural effusions. Patient was originally hospitalized with shortness of breath in 12/2011. 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 She also had malignant ascites. She underwent bilateral thoracentesis and paracentesis procedures during her hospitalization. During her hospitalization she was seen by gynecologic oncology.  #3 Patient began neoadjuvant chemotherapy consisting of Taxol and Carboplatinum. Her first cycle was administered during her hospitalization. She completed 6 cycles of Taxol/Carboplatinum from 01/07/2012 - 06/23/2012. Per patient "very sick with initial chemo and it did not work"  #4. laparotomy in 05/2012 that revealed significant residual disease.  #5 Patient began adjuvant therapy with Topotecan/Avastin starting on 07/24/2012, given thru 06-04-2013. Progression by CA125 marker and CT May 2015. Gemzar began 07-23-13; cycle 2 on 08-06-13 was dose reduced by 50% due to poor tolerance.   Review of systems as above, also: No pain. Minimal NP cough. 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 Is able to sleep. Very emotional. Last vomiting was 7-6. Bladder ok. No bleeding, no fever.  Remainder of 10 point Review of Systems negative.  Objective:  Vital signs in last 24 hours:  BP 125/75  Pulse 74  Temp(Src) 98.2 F (36.8 C) (Oral)  Resp 16  Ht $R'5\' 7"'zh$  (1.702 m)  Wt 149 lb 11.2  oz (67.903 kg)  BMI 23.44 kg/m2  SpO2 84%  Weight is up 3.5 lbs  Alert, oriented, crying at times . Ambulatory with effort. Appears slightly SOB with activity just in exam room. Pale, cachectic, looks tired and generally  ill. Partial alopecia  HEENT:PERRL, sclerae not icteric. Oral mucosa extremely dry without lesions, posterior pharynx clear.  Neck supple. No JVD.  Lymphatics:no cervical,suraclavicular adenopathy Resp: very diminished BS thruout, without wheezes or rales. Hyperresonant to percussion. Cardio: regular rate and rhythm. No gallop. GI: soft, nontender, mildly distended without change, no appreciable mass or organomegaly. Diminished bowel sounds. Surgical incision not remarkable. Musculoskeletal/ Extremities: without pitting edema, cords, tenderness Neuro: no change peripheral neuropathy. Speech fluent, CN, motor, cerebellar grossly nonfocal Skin without rash, ecchymosis, petechiae Portacath-without erythema or tenderness  O2 saturation with walking in hall dropped from 90s to 84%,  Lab Results:  Results for orders placed in visit on 08/24/13  CBC WITH DIFFERENTIAL      Result Value Ref Range   WBC 10.0  3.9 - 10.3 10e3/uL   NEUT# 8.0 (*) 1.5 - 6.5 10e3/uL   HGB 10.2 (*) 11.6 - 15.9 g/dL   HCT 33.7 (*) 34.8 - 46.6 %   Platelets 562 (*) 145 - 400 10e3/uL   MCV 98.3  79.5 - 101.0 fL   MCH 29.7  25.1 - 34.0 pg   MCHC 30.3 (*) 31.5 - 36.0 g/dL   RBC 3.43 (*) 3.70 - 5.45 10e6/uL   RDW 22.4 (*) 11.2 - 14.5 %   lymph# 0.9  0.9 - 3.3 10e3/uL   MONO# 1.0 (*) 0.1 - 0.9 10e3/uL   Eosinophils Absolute 0.1  0.0 - 0.5 10e3/uL   Basophils Absolute 0.0  0.0 - 0.1 10e3/uL   NEUT% 80.4 (*) 38.4 - 76.8 %   LYMPH% 8.9 (*) 14.0 - 49.7 %   MONO% 9.7  0.0 - 14.0 %   EOS% 0.7  0.0 - 7.0 %   BASO% 0.3  0.0 - 2.0 %  COMPREHENSIVE METABOLIC PANEL (AO13)      Result Value Ref Range   Sodium 139  136 - 145 mEq/L   Potassium 4.3  3.5 - 5.1 mEq/L   Chloride 106  98 - 109 mEq/L   CO2 21 (*) 22 - 29 mEq/L   Glucose 106  70 - 140 mg/dl   BUN 19.1  7.0 - 26.0 mg/dL   Creatinine 0.9  0.6 - 1.1 mg/dL   Total Bilirubin 0.48  0.20 - 1.20 mg/dL   Alkaline Phosphatase 191 (*) 40 - 150 U/L   AST 35 (*) 5 - 34  U/L   ALT 31  0 - 55 U/L   Total Protein 6.7  6.4 - 8.3 g/dL   Albumin 3.0 (*) 3.5 - 5.0 g/dL   Calcium 9.2  8.4 - 10.4 mg/dL   Anion Gap 12 (*) 3 - 11 mEq/L     Studies/Results:  No results found.  Medications: I have reviewed the patient's current medications as noted above.  I have given patient and husband written and oral instructions to take:  Venlafaxine one daily for depression Trimethoprim one daily for bladder oxybutinin one daily for bladder Omeprazole twice daily for reflux Gabapentin one daily for neuropathy Docusate twice daily as stool softener Senokot 2 tabs twice daily for constipation Add Dulcolox tablet twice daily for constipation Bisoprolol one daily for heart   pink As Needed medications: Ondansetron ODT one every 8 hrs as needed for nausea  Ativan one SL or po every 6 hrs as needed for nausea or to relax valcyclovir one daily if mouth ulcers recur  Orange Do Not Use Now lipitor Compazine Xanax   DISCUSSION: we have discussed considerations for continuing chemotherapy with a different regimen in hopes of getting some control of the cancer without intolerable side effects from chemotherapy, vs palliative interventions without further chemotherapy. I have been clear that we can only make our best guess for treatment, as sometimes individual patients cannot tolerate even what is anticipated to be gentle. I have explained that the person has to be well enough and strong enough to take chemotherapy, and that presently she is not well enough to treat even if she wanted that. We have discussed assistance from Hospice after chemotherapy has been stopped, which would certainly be a great help with her medications. Patient and husband are understandably upset, mentioning that they thought they would "have more time", that they have been married 101 years, that patient wants 35 yo granddaughter to remember her. She has made two quilts for the granddaughter and wants  family members to talk with the child about her after she has died. She tells me that she would like to continue treatment if possible. We have decided to see how she is next week, possibly to consider Alimta then. (she has not had teachin,  will need to do paperwork for coverage with [facility], and to start folate and B12 week prior to treatment if so). She is given IVF again today after MD visit. She was extremely upset in infusion room and also given ativan with improvement. O2 was used during time in infusion area.  They are in agreement with having home O2 @ 2 liters prn, which we have requested from [facility]. She understands that she should use this when she feels weak or SOB, as it may help her recover more quickly after activity.   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 Progression by marker and with bulky disease by CT. Began every other week gemzar 07-23-13, tolerated poorly even with 50% dose reduction for cycle 2 on 08-06-13. Not clear if she will be able to tolerate further attempts at treatment, and would be appropriate for hospice if chemotherapy is not continued. I will see her back next week. 2.hypoxemia documented now: begin home O2 @ 2 liters prn. CT chest in 06-2013 no recurrent effusions. 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 BRCA testing ~ 2012 reportedly negative.  5.post remote hysterectomy  6. 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  Time spent 45 min including >50% counseling and coordination of care   Christal Lagerstrom P, MD   08/24/2013, 4:29 PM

## 2013-08-24 NOTE — Patient Instructions (Signed)

## 2013-08-24 NOTE — Telephone Encounter (Signed)
Faxed referral to [name] for home oxygen 2 liters. Patient's O2 saturation dropped to 84 % while ambulating in hall at [facility]

## 2013-08-24 NOTE — Progress Notes (Signed)
O2 saturation dropped to 84% while ambulating at [facility]

## 2013-08-30 ENCOUNTER — Other Ambulatory Visit: Payer: Self-pay

## 2013-08-30 ENCOUNTER — Other Ambulatory Visit: Payer: Self-pay | Admitting: Oncology

## 2013-08-30 DIAGNOSIS — C569 Malignant neoplasm of unspecified ovary: Secondary | ICD-10-CM

## 2013-08-31 ENCOUNTER — Ambulatory Visit (HOSPITAL_COMMUNITY)
Admission: RE | Admit: 2013-08-31 | Discharge: 2013-08-31 | Disposition: A | Discharge status: Home / Self Care | Payer: Medicare Other | Source: Ambulatory Visit | Attending: Oncology | Admitting: Oncology

## 2013-08-31 ENCOUNTER — Other Ambulatory Visit: Payer: Self-pay | Admitting: *Deleted

## 2013-08-31 ENCOUNTER — Telehealth: Payer: Self-pay

## 2013-08-31 ENCOUNTER — Other Ambulatory Visit (HOSPITAL_BASED_OUTPATIENT_CLINIC_OR_DEPARTMENT_OTHER): Payer: Medicare Other

## 2013-08-31 ENCOUNTER — Ambulatory Visit: Payer: Medicare Other

## 2013-08-31 ENCOUNTER — Ambulatory Visit (HOSPITAL_BASED_OUTPATIENT_CLINIC_OR_DEPARTMENT_OTHER): Payer: Medicare Other | Admitting: Oncology

## 2013-08-31 ENCOUNTER — Encounter: Payer: Self-pay | Admitting: Oncology

## 2013-08-31 ENCOUNTER — Telehealth: Payer: Self-pay | Admitting: Oncology

## 2013-08-31 VITALS — BP 138/88 | HR 84 | Temp 97.6°F | Resp 18 | Ht 67.0 in | Wt 151.5 lb

## 2013-08-31 DIAGNOSIS — K117 Disturbances of salivary secretion: Secondary | ICD-10-CM

## 2013-08-31 DIAGNOSIS — C569 Malignant neoplasm of unspecified ovary: Secondary | ICD-10-CM

## 2013-08-31 DIAGNOSIS — R188 Other ascites: Secondary | ICD-10-CM | POA: Diagnosis not present

## 2013-08-31 DIAGNOSIS — J449 Chronic obstructive pulmonary disease, unspecified: Secondary | ICD-10-CM

## 2013-08-31 DIAGNOSIS — R0902 Hypoxemia: Secondary | ICD-10-CM

## 2013-08-31 DIAGNOSIS — C801 Malignant (primary) neoplasm, unspecified: Secondary | ICD-10-CM

## 2013-08-31 DIAGNOSIS — G609 Hereditary and idiopathic neuropathy, unspecified: Secondary | ICD-10-CM

## 2013-08-31 DIAGNOSIS — D0590 Unspecified type of carcinoma in situ of unspecified breast: Secondary | ICD-10-CM

## 2013-08-31 DIAGNOSIS — I2510 Atherosclerotic heart disease of native coronary artery without angina pectoris: Secondary | ICD-10-CM

## 2013-08-31 DIAGNOSIS — J910 Malignant pleural effusion: Secondary | ICD-10-CM

## 2013-08-31 DIAGNOSIS — D649 Anemia, unspecified: Secondary | ICD-10-CM

## 2013-08-31 DIAGNOSIS — Z87891 Personal history of nicotine dependence: Secondary | ICD-10-CM

## 2013-08-31 LAB — CBC WITH DIFFERENTIAL/PLATELET
BASO%: 1.1 % (ref 0.0–2.0)
Basophils Absolute: 0.1 10*3/uL (ref 0.0–0.1)
EOS ABS: 0.1 10*3/uL (ref 0.0–0.5)
EOS%: 1.1 % (ref 0.0–7.0)
HCT: 33.2 % — ABNORMAL LOW (ref 34.8–46.6)
HGB: 10.5 g/dL — ABNORMAL LOW (ref 11.6–15.9)
LYMPH%: 10.7 % — ABNORMAL LOW (ref 14.0–49.7)
MCH: 30.1 pg (ref 25.1–34.0)
MCHC: 31.7 g/dL (ref 31.5–36.0)
MCV: 94.9 fL (ref 79.5–101.0)
MONO#: 0.8 10*3/uL (ref 0.1–0.9)
MONO%: 10 % (ref 0.0–14.0)
NEUT#: 6.3 10*3/uL (ref 1.5–6.5)
NEUT%: 77.1 % — ABNORMAL HIGH (ref 38.4–76.8)
Platelets: 479 10*3/uL — ABNORMAL HIGH (ref 145–400)
RBC: 3.5 10*6/uL — AB (ref 3.70–5.45)
RDW: 22 % — ABNORMAL HIGH (ref 11.2–14.5)
WBC: 8.2 10*3/uL (ref 3.9–10.3)
lymph#: 0.9 10*3/uL (ref 0.9–3.3)

## 2013-08-31 LAB — COMPREHENSIVE METABOLIC PANEL (CC13)
ALBUMIN: 3 g/dL — AB (ref 3.5–5.0)
ALT: 19 U/L (ref 0–55)
AST: 25 U/L (ref 5–34)
Alkaline Phosphatase: 164 U/L — ABNORMAL HIGH (ref 40–150)
Anion Gap: 14 mEq/L — ABNORMAL HIGH (ref 3–11)
BUN: 16.7 mg/dL (ref 7.0–26.0)
CO2: 23 mEq/L (ref 22–29)
Calcium: 9.1 mg/dL (ref 8.4–10.4)
Chloride: 105 mEq/L (ref 98–109)
Creatinine: 0.8 mg/dL (ref 0.6–1.1)
GLUCOSE: 98 mg/dL (ref 70–140)
POTASSIUM: 4.4 meq/L (ref 3.5–5.1)
SODIUM: 142 meq/L (ref 136–145)
TOTAL PROTEIN: 6.7 g/dL (ref 6.4–8.3)
Total Bilirubin: 0.53 mg/dL (ref 0.20–1.20)

## 2013-08-31 MED ORDER — SODIUM CHLORIDE 0.9 % IV SOLN: 500.0000 mL | Freq: Once | INTRAVENOUS | Status: DC

## 2013-08-31 NOTE — Procedures (Signed)
Successful US guided paracentesis from [facility].  Yielded 2.6 liters of amber colored fluid.  No immediate complications.  Pt tolerated well.   Specimen was sent for labs.  Tsosie Billing D PA-C 08/31/2013 4:10 PM

## 2013-08-31 NOTE — Telephone Encounter (Signed)
[facility]ed [facility] information to [facility] for home [facility] to be delivered tonight.

## 2013-08-31 NOTE — Telephone Encounter (Signed)
per pof to sch pt appt-CX fluids today-scd US-[facility] stated for pt to come now-gave pt copy of sch

## 2013-08-31 NOTE — Progress Notes (Signed)
OFFICE PROGRESS NOTE   08/31/2013   Physicians:E.Knapp (PCP), Nancy Marus, Rolm Bookbinder, Marlinda Mike, (B.Bartle), Clois Comber, Karl Stonecipher   INTERVAL HISTORY:  Patient is seen, alone for visit, in continuing attention to progressive high grade serous gyn carcinoma. Last chemotherapy was cycle 2 gemzar on 08-06-13, dose reduced by 50% but still not tolerated. She has been on treatment break since then, not clear if performance status will improve to allow attempt at another regimen.  Husband did not accompany today "he thinks we have given up and he is too upset".  Home oxygen ordered from [place] on 08-24-13 has not been delivered; this office was not contacted with any problems about orders or by patient/family prior to visit today. O2 saturation with walking last week was documented 84% and she remains SOB with minimal activity "I just have to stay sitting down". 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  Per [facility], this will allow delivery of home oxygen today.  Even with SOB and fatigue with minimal exertion, she is feeling a little better than last week. She had some diarrhea, has decreased laxatives/ stool softeners with improvement. Nausea seems better tho still intermittent, and she is eating and drinking more. She has early satiety and lateral abdominal pain if she lies other than on back, with more abdominal  distension.    ONCOLOGIC HISTORY   Ovarian cancer (Resolved)   01/06/2012 Initial Diagnosis Ovarian cancer   01/07/2012 - 06/23/2012 Chemotherapy 6 cycles of paciltaxel and carboplatin   05/23/2012 Surgery x-lap with significant residual disease   07/24/2012 - 12/18/2012 Chemotherapy 6 cycles of topotecan and avastin    Ovarian cancer   12/29/2011 Initial Diagnosis Ovarian cancer, IV based on thoracentesis    - 04/21/2012 Chemotherapy 6 cycles of paclitaxel and carboplatin   05/30/2012 Surgery Suboptimal debulking   07/24/2012 - 04/16/2013 Chemotherapy 10 cycles of topotecan and avastin  Patient had 3 sentinel biopsied all of them were negative for metastatic disease. Patient underwent radiation therapy between 06/05/2009 through 07/02/2009. She was then begun on Tamoxifen 20 mg daily in 09/2009. BRCA testing reportedly negative in ~ 2012.  #2 In 12/2011 she received the diagnosis of gynecologic malignancy presenting with abdominal mass and pleural effusions. Patient was originally hospitalized with shortness of breath in 12/2011. It was discovered she had a malignant pleural effusion (she is status post bilateral Pleurx catheter placement in 12/2011 and left talc pleurodesis by Dr Gilford Raid). She also had malignant ascites. She underwent bilateral thoracentesis and paracentesis procedures during her hospitalization. During her hospitalization she was seen by gynecologic oncology.  #3 Patient began neoadjuvant chemotherapy consisting of Taxol and Carboplatinum. Her first cycle was administered during her hospitalization. She completed 6 cycles of Taxol/Carboplatinum from 01/07/2012 - 06/23/2012. Per patient "very sick with initial chemo and it did not work"  #4. laparotomy in 05/2012 that revealed significant residual disease.  #5 Patient began adjuvant therapy with Topotecan/Avastin starting on 07/24/2012, given thru 06-04-2013.  Progression by CA125 marker and CT May 2015. Gemzar began 07-23-13; cycle 2 on  08-06-13 was dose reduced by 50% due to poor tolerance.   Review of systems as above, also: No fever. No bleeding. No change severe xerostomia. No productive cough.  Remainder of 10 point Review of Systems negative.  Objective:  Vital signs in last 24 hours:  BP 138/88  Pulse 84  Temp(Src) 97.6 F (36.4 C) (Oral)  Resp 18  Ht 5' 7" (1.702 m)  Wt 151 lb 8 oz (68.72 kg)  BMI 23.72 kg/m2 Weight is up 2 lbs Alert, oriented and appropriate. Ambulatory slowly. Looks cachectic and chronically ill, respirations increased with minimal exertion in exam room. Color poor but not cyanotic. Partial allopecia  HEENT:PERRL, sclerae not icteric. Oral mucosa very dry without lesions, posterior pharynx clear.  Neck supple. No JVD.  Lymphatics:no cervical,suraclavicular adenopathy Resp: decreased BS and dullness 1/3 up on right, otherwise no wheezes or rales, BS diffusely diminished otherwise as baseline. Cardio: regular rate and rhythm. No gallop. GI: soft, nontender, tightly distended, no appreciable mass or organomegaly. A few bowel sounds. Surgical incision not remarkable. Musculoskeletal/ Extremities: without pitting edema, cords, tenderness Neuro: no change peripheral neuropathy. Otherwise nonfocal Skin without rash, ecchymosis, petechiae Portacath-without erythema or tenderness  Lab Results:  Results for orders placed in visit on 08/31/13  CBC WITH DIFFERENTIAL      Result Value Ref Range   WBC 8.2  3.9 - 10.3 10e3/uL   NEUT# 6.3  1.5 - 6.5 10e3/uL   HGB 10.5 (*) 11.6 - 15.9 g/dL   HCT 33.2 (*) 34.8 - 46.6 %   Platelets 479 (*) 145 - 400 10e3/uL   MCV 94.9  79.5 - 101.0 fL   MCH 30.1  25.1 - 34.0 pg   MCHC 31.7  31.5 - 36.0 g/dL   RBC 3.50 (*) 3.70 - 5.45 10e6/uL   RDW 22.0 (*) 11.2 - 14.5 %   lymph# 0.9  0.9 - 3.3 10e3/uL   MONO# 0.8  0.1 - 0.9 10e3/uL   Eosinophils Absolute 0.1  0.0 - 0.5 10e3/uL   Basophils Absolute 0.1  0.0 - 0.1 10e3/uL   NEUT% 77.1 (*) 38.4 - 76.8 %    LYMPH% 10.7 (*) 14.0 - 49.7 %   MONO% 10.0  0.0 - 14.0 %   EOS% 1.1  0.0 - 7.0 %   BASO% 1.1  0.0 - 2.0 %  COMPREHENSIVE METABOLIC PANEL (YQ65)      Result Value Ref Range   Sodium 142  136 - 145 mEq/L   Potassium 4.4  3.5 - 5.1 mEq/L   Chloride 105  98 - 109 mEq/L   CO2 23  22 - 29 mEq/L   Glucose 98  70 - 140 mg/dl   BUN 16.7  7.0 - 26.0 mg/dL   Creatinine 0.8  0.6 - 1.1 mg/dL   Total Bilirubin 0.53  0.20 - 1.20 mg/dL   Alkaline Phosphatase 164 (*) 40 - 150 U/L   AST 25  5 - 34 U/L   ALT 19  0 - 55 U/L   Total Protein 6.7  6.4 - 8.3 g/dL   Albumin 3.0 (*) 3.5 - 5.0 g/dL   Calcium 9.1  8.4 - 10.4 mg/dL   Anion Gap 14 (*) 3 - 11 mEq/L    CA 125 from 08-20-13:   246  Studies/Results:  No results found. Patient sent from office for US/ possible paracentesis.  Medications: I have reviewed the  patient's current medications. I am not clear if she followed instructions written and discussed last visit.  DISCUSSION: patient understands that we will follow up immediately with the home oxygen request. She agrees with paracentesis if sufficient fluid present, as this may help lung capacity and improve early satiety, at least until fluid reaccumulates. She would like to continue treatment break at least thru family beach trip Aug 1 x one week, then wants to consider a different chemotherapy depending on performance status. She verbalizes that she felt much better during break off of topotecan/avastin early 2015, and that husband and children get comfort out of ongoing treatment. She felt that conversation with Lynnell Dike, RN last week in infusion was the most helpful that she has had, patient aware that Eustaquio Maize was previously hospice RN.   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 Progression by marker and with bulky disease by CT. Began every other week gemzar 07-23-13, tolerated poorly even with 50% dose reduction for cycle 2 on 08-06-13. Some better this past week on treatment break. Paracentesis if sufficient ascites today, home O2. I will see her back at least after her early August beach trip. 2.hypoxemia documented now: begin home O2 @ 2 liters prn. CT chest in 06-2013 no recurrent effusions. Dullness and absent BS RLL now, note had bilateral pleurex catheters at presentation, then left talc pleurodesis only. May need repeat CXR if breathing not significantly better following paracentesis. 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 BRCA testing ~ 2012 reportedly negative.  5.post remote hysterectomy  6. 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 If decide to try alimta when seen next, she will still need folate and B12 week prior and will need to fill out coverage forms with Lenise in [facility] financial counseling.   Gordy Levan, MD   08/31/2013, 4:37 PM

## 2013-09-03 ENCOUNTER — Other Ambulatory Visit: Payer: Medicare Other

## 2013-09-03 ENCOUNTER — Ambulatory Visit: Payer: Medicare Other

## 2013-09-03 ENCOUNTER — Telehealth: Payer: Self-pay | Admitting: *Deleted

## 2013-09-03 ENCOUNTER — Ambulatory Visit: Payer: Medicare Other | Admitting: Oncology

## 2013-09-03 NOTE — Telephone Encounter (Signed)
Patient called for follow up regarding recent paracentesis. Per patient, the paracentesis "made all the difference in the world." She reports decreased SOB and can eat more and walk further. No new problems over the weekend. O2 still not delivered. I will follow up with [place] regarding this.

## 2013-09-03 NOTE — Telephone Encounter (Signed)
Per Linward Headland at [facility] they are unable to get O2 for [name] [name] at this time.  [facility] called and spoke with Rodena Piety there. Per Barrett Henle should be able to get home O2 to patient today and also get patient O2 to take to the beach the week of August 1st. Prescription, MD note with oxygen sats and insurance information faxed to [facility] at [phone]. Confirmed with Rodena Piety that they received information.

## 2013-09-05 ENCOUNTER — Telehealth: Payer: Self-pay | Admitting: *Deleted

## 2013-09-05 NOTE — Telephone Encounter (Signed)
Faxed certificate of medical necessity to [place]. Copy send to HIM for scanning.

## 2013-09-12 ENCOUNTER — Other Ambulatory Visit: Payer: Self-pay | Admitting: Oncology

## 2013-09-14 ENCOUNTER — Encounter: Payer: Self-pay | Admitting: General Practice

## 2013-09-14 NOTE — Progress Notes (Signed)
Followed up with [name] [name] by phone.  She was in good spirits, preparing for a beach vacation that will include prized quality time with family.  Reminded her of ongoing chaplain availability and encouraged her to reach out when she returns to [facility].  Success, [place]

## 2013-09-18 ENCOUNTER — Other Ambulatory Visit: Payer: Self-pay | Admitting: Oncology

## 2013-09-18 DIAGNOSIS — C569 Malignant neoplasm of unspecified ovary: Secondary | ICD-10-CM

## 2013-09-24 ENCOUNTER — Telehealth: Payer: Self-pay | Admitting: Oncology

## 2013-09-24 ENCOUNTER — Other Ambulatory Visit: Payer: Medicare Other

## 2013-09-24 ENCOUNTER — Encounter: Payer: Self-pay | Admitting: Oncology

## 2013-09-24 ENCOUNTER — Ambulatory Visit (HOSPITAL_BASED_OUTPATIENT_CLINIC_OR_DEPARTMENT_OTHER): Payer: Medicare Other | Admitting: Oncology

## 2013-09-24 ENCOUNTER — Other Ambulatory Visit (HOSPITAL_BASED_OUTPATIENT_CLINIC_OR_DEPARTMENT_OTHER): Payer: Medicare Other

## 2013-09-24 ENCOUNTER — Telehealth: Payer: Self-pay | Admitting: *Deleted

## 2013-09-24 ENCOUNTER — Ambulatory Visit: Payer: Medicare Other | Admitting: Oncology

## 2013-09-24 ENCOUNTER — Ambulatory Visit: Payer: Medicare Other

## 2013-09-24 VITALS — BP 127/74 | HR 83 | Temp 98.1°F | Resp 18 | Ht 67.0 in | Wt 153.1 lb

## 2013-09-24 DIAGNOSIS — C801 Malignant (primary) neoplasm, unspecified: Secondary | ICD-10-CM

## 2013-09-24 DIAGNOSIS — R0902 Hypoxemia: Secondary | ICD-10-CM

## 2013-09-24 DIAGNOSIS — K117 Disturbances of salivary secretion: Secondary | ICD-10-CM

## 2013-09-24 DIAGNOSIS — C569 Malignant neoplasm of unspecified ovary: Secondary | ICD-10-CM

## 2013-09-24 DIAGNOSIS — G620 Drug-induced polyneuropathy: Secondary | ICD-10-CM

## 2013-09-24 LAB — CBC WITH DIFFERENTIAL/PLATELET
BASO%: 1.2 % (ref 0.0–2.0)
Basophils Absolute: 0.1 10*3/uL (ref 0.0–0.1)
EOS ABS: 0.1 10*3/uL (ref 0.0–0.5)
EOS%: 1.8 % (ref 0.0–7.0)
HEMATOCRIT: 33.4 % — AB (ref 34.8–46.6)
HGB: 10.4 g/dL — ABNORMAL LOW (ref 11.6–15.9)
LYMPH#: 0.5 10*3/uL — AB (ref 0.9–3.3)
LYMPH%: 7.1 % — AB (ref 14.0–49.7)
MCH: 28.4 pg (ref 25.1–34.0)
MCHC: 31.1 g/dL — ABNORMAL LOW (ref 31.5–36.0)
MCV: 91.2 fL (ref 79.5–101.0)
MONO#: 0.5 10*3/uL (ref 0.1–0.9)
MONO%: 6.9 % (ref 0.0–14.0)
NEUT%: 83 % — ABNORMAL HIGH (ref 38.4–76.8)
NEUTROS ABS: 6 10*3/uL (ref 1.5–6.5)
PLATELETS: 515 10*3/uL — AB (ref 145–400)
RBC: 3.66 10*6/uL — ABNORMAL LOW (ref 3.70–5.45)
RDW: 21.1 % — ABNORMAL HIGH (ref 11.2–14.5)
WBC: 7.3 10*3/uL (ref 3.9–10.3)

## 2013-09-24 LAB — COMPREHENSIVE METABOLIC PANEL
ALT: 11 U/L (ref 0–35)
AST: 21 U/L (ref 0–37)
Albumin: 3 g/dL — ABNORMAL LOW (ref 3.5–5.2)
Alkaline Phosphatase: 166 U/L — ABNORMAL HIGH (ref 39–117)
BUN: 19 mg/dL (ref 6–23)
CALCIUM: 9.8 mg/dL (ref 8.4–10.5)
CHLORIDE: 100 meq/L (ref 96–112)
CO2: 23 meq/L (ref 19–32)
CREATININE: 0.78 mg/dL (ref 0.50–1.10)
Glucose, Bld: 118 mg/dL — ABNORMAL HIGH (ref 70–99)
POTASSIUM: 3.8 meq/L (ref 3.5–5.3)
Sodium: 141 mEq/L (ref 135–145)
TOTAL PROTEIN: 7.4 g/dL (ref 6.0–8.3)
Total Bilirubin: 0.3 mg/dL (ref 0.2–1.2)

## 2013-09-24 MED ORDER — FOLIC ACID 1 MG PO TABS: 1.0000 mg | ORAL_TABLET | Freq: Every day | ORAL | Status: AC

## 2013-09-24 MED ORDER — CYANOCOBALAMIN 1000 MCG/ML IJ SOLN
1000.0000 ug | Freq: Once | INTRAMUSCULAR | Status: AC
  Administered 2013-09-24: 1000 ug via INTRAMUSCULAR

## 2013-09-24 MED ORDER — HYDROCODONE-ACETAMINOPHEN 5-325 MG PO TABS: 1.0000 | ORAL_TABLET | ORAL | Status: DC | PRN

## 2013-09-24 MED ORDER — DEXAMETHASONE 4 MG PO TABS: ORAL_TABLET | ORAL | Status: DC

## 2013-09-24 NOTE — Telephone Encounter (Signed)
per pof to sch pt appt-sch and gave pt copy of sch-sent MW an wmail to sch trmt-will call pt once reply

## 2013-09-24 NOTE — Telephone Encounter (Signed)
Per staff message and POF I have scheduled appts. Advised scheduler of appts. JMW  

## 2013-09-24 NOTE — Progress Notes (Signed)
Received letter from [employer].  Pt is approved for medication related to Ovarian Cancer from 09/24/13 to 09/24/14 or when the benefit cap has been met.  Expenses can be submitted for reimbursement for dos 06/26/13 to 09/24/14.  The amount of the grant is $20,000.  I will forward copy of letter to [facility] in the billing dept.

## 2013-09-24 NOTE — Progress Notes (Signed)
OFFICE PROGRESS NOTE   09/24/2013   Physicians:E.Knapp (PCP), Nancy Marus, Rolm Bookbinder, Marlinda Mike, (B.Bartle), Clois Comber, Karl Stonecipher   INTERVAL HISTORY:   Patient is seen, alone for visit, in scheduled follow up of recurrent ovarian cancer which recently progressed on topotecan avastin, then patient intolerant to gemzar; she has been on treatment break since 08-06-13.  Patient seems somewhat better overall other than apparent recurrent ascites, which was malignant at paracentesis for 2.6 liters on 08-31-13. Nausea is better, vomiting and dry heaves resolved, appetite better other than early satiety. She is using prn home O2 during day, this finally obtained thru [employer]. She enjoyed beach trip with family last week, and has learned that daughter is expecting a baby girl in Jan 2016.  She wants to try further treatment if she can tolerate and if it is helpful, rather than Hospice referral yet.  She has PAC  ONCOLOGIC HISTORY   Ovarian cancer (Resolved)   01/06/2012 Initial Diagnosis Ovarian cancer   01/07/2012 - 06/23/2012 Chemotherapy 6 cycles of paciltaxel and carboplatin   05/23/2012 Surgery x-lap with significant residual disease   07/24/2012 - 12/18/2012 Chemotherapy 6 cycles of topotecan and avastin    Ovarian cancer   12/29/2011 Initial Diagnosis Ovarian cancer, IV based on thoracentesis    - 04/21/2012 Chemotherapy 6 cycles of paclitaxel and carboplatin   05/30/2012 Surgery Suboptimal debulking   07/24/2012 - 04/16/2013 Chemotherapy 10 cycles of topotecan and avastin  Patient had 3 sentinel biopsied all of them were negative for metastatic disease. Patient underwent radiation therapy between 06/05/2009 through 07/02/2009. She was then begun on Tamoxifen 20 mg daily in 09/2009. BRCA testing reportedly negative in ~ 2012.  #2 In 12/2011 she received the diagnosis of gynecologic malignancy presenting with abdominal mass and pleural effusions. Patient was originally  hospitalized with shortness of breath in 12/2011. It was discovered she had a malignant pleural effusion (she is status post bilateral Pleurx catheter placement in 12/2011 and left talc pleurodesis by Dr Gilford Raid). She also had malignant ascites. She underwent bilateral thoracentesis and paracentesis procedures during her hospitalization. During her hospitalization she was seen by gynecologic oncology.  #3 Patient began neoadjuvant chemotherapy consisting of Taxol and Carboplatinum. Her first cycle was administered during her hospitalization. She completed 6 cycles of Taxol/Carboplatinum from 01/07/2012 - 06/23/2012. Per patient "very sick with initial chemo and it did not work"  #4. laparotomy in 05/2012 that revealed significant residual disease.  #5 Patient began adjuvant therapy with Topotecan/Avastin starting on 07/24/2012, given thru 06-04-2013. Progression by CA125 marker and CT May 2015. Gemzar began 07-23-13; cycle 2 on 08-06-13, however even reduced dose was not tolerable to patient.   Review of systems as above, also: No fever or symptoms of infection. Bowels move several times each AM. She has used Norco a couple of times daily for pain across upper and lower abdomen, with improvement. No increased SOB, prn O2 is helpful. No LE swelling. No problems with PAC. Remainder of 10 point Review of Systems negative.  Objective:  Vital signs in last 24 hours:  BP 127/74  Pulse 83  Temp(Src) 98.1 F (36.7 C) (Oral)  Resp 18  Ht $R'5\' 7"'gs$  (1.702 m)  Wt 153 lb 1.6 oz (69.446 kg)  BMI 23.97 kg/m2  Weight is up 2 lbs.  Alert, oriented and appropriate. Ambulatory without assistance.  Partial alopecia  HEENT:PERRL, sclerae not icteric. Oral mucosa extremely dry without lesions, posterior pharynx clear.  Neck supple. No JVD.  Lymphatics:no  cervical,suraclavicular or inguinal adenopathy Resp: diminished BS thruout otherwise clear to auscultation bilaterally and normal percussion  bilaterally Cardio: regular rate and rhythm. No gallop. GI: soft, nontender, more distended again almost tight, cannot appreciate mass or organomegaly. Some bowel sounds. Surgical incision not remarkable. Musculoskeletal/ Extremities: without pitting edema, cords, tenderness Neuro: no change peripheral neuropathy in feet. Otherwise nonfocal. PSYCH: brighter mood overall Skin without rash, ecchymosis, petechiae Portacath-without erythema or tenderness  Lab Results:  Results for orders placed in visit on 09/24/13  COMPREHENSIVE METABOLIC PANEL      Result Value Ref Range   Sodium 141  135 - 145 mEq/L   Potassium 3.8  3.5 - 5.3 mEq/L   Chloride 100  96 - 112 mEq/L   CO2 23  19 - 32 mEq/L   Glucose, Bld 118 (*) 70 - 99 mg/dL   BUN 19  6 - 23 mg/dL   Creatinine, Ser 0.78  0.50 - 1.10 mg/dL   Total Bilirubin 0.3  0.2 - 1.2 mg/dL   Alkaline Phosphatase 166 (*) 39 - 117 U/L   AST 21  0 - 37 U/L   ALT 11  0 - 35 U/L   Total Protein 7.4  6.0 - 8.3 g/dL   Albumin 3.0 (*) 3.5 - 5.2 g/dL   Calcium 9.8  8.4 - 10.5 mg/dL    CBC with WBC 7.3, ANC 6.0, Hgb 10.4, plt 515  CA 125 available after visit 278, having been 52 in early July and 131 in May.  Studies/Results:  No results found.  Medications: I have reviewed the patient's current medications. B12 injection given, folic acid 1 mg daily to begin. Decadron will be 4 mg bid with food x 3 d beginning day prior to Alimta. Prn hydrocodone APAP 5-325.  DISCUSSION: Clinically she is some better overall out further from gemzar and with supplemental O2; repeat therapeutic paracentesis also ordered now. She wants to try further treatment, especially with expected new grandchild. We have discussed oral etoposide or alimta, suggested particularly as they are generally well tolerated and can benefit some patients even after multiple prior regimens, tho obviously she might not do well with these either. As she has had long time difficulty taking pills,  she prefers IV alimta. She has had teaching by RN, has met with financial counselor as ovarian cancer is off label use of alimta, and has had B12 injection/ will begin folic acid. She understands that she will need decadron 4 mg bid x 3 days beginning day prior to alimta, and that skin rash is possible in addition to other side effects as discussed. She prefers treatment on Mondays, understands that start of treatment needs to be at least a week after starting B12/folate.  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 Progression by marker and with bulky disease by CT. Began every other week gemzar 07-23-13, tolerated poorly even with 50% dose reduction for cycle 2 on 08-06-13. Repeat therapeutic paracentesis this week. Begin Alimta in ~ 1 week and close follow up after that treatment due to difficulties with previous regimens. 2.hypoxemia documented now: on home O2 @ 2 liters prn. CT chest in 06-2013 no recurrent effusions. Note had bilateral pleurex catheters at presentation, then left talc pleurodesis only.  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 BRCA testing ~ 2012 reportedly negative.  5.post remote hysterectomy  6. 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 She prefers xerostomia to stopping ditropan. 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 Antiemetics as previously. Prior auth requested. Plan 4-6  cycles if tolerated and if response.  Chemo orders entered.    LIVESAY,LENNIS P, MD   09/24/2013, 12:54 PM

## 2013-09-25 LAB — CA 125: CA 125: 278.1 U/mL — AB (ref 0.0–30.2)

## 2013-09-26 ENCOUNTER — Ambulatory Visit (HOSPITAL_COMMUNITY): Payer: Medicare Other

## 2013-09-27 ENCOUNTER — Encounter: Payer: Self-pay | Admitting: *Deleted

## 2013-09-27 ENCOUNTER — Ambulatory Visit (HOSPITAL_COMMUNITY)
Admission: RE | Admit: 2013-09-27 | Discharge: 2013-09-27 | Disposition: A | Discharge status: Home / Self Care | Payer: Medicare Other | Source: Ambulatory Visit | Attending: Oncology | Admitting: Oncology

## 2013-09-27 DIAGNOSIS — R180 Malignant ascites: Secondary | ICD-10-CM | POA: Insufficient documentation

## 2013-09-27 DIAGNOSIS — C569 Malignant neoplasm of unspecified ovary: Secondary | ICD-10-CM | POA: Diagnosis not present

## 2013-09-27 NOTE — Procedures (Signed)
Successful US guided paracentesis from RUQ.  Yielded 4.1 liters of amber colored fluid.  No immediate complications.  Pt tolerated well.   Specimen was not sent for labs.  Tsosie Billing D PA-C 09/27/2013 1:16 PM

## 2013-09-28 ENCOUNTER — Telehealth: Payer: Self-pay | Admitting: *Deleted

## 2013-09-28 NOTE — Telephone Encounter (Signed)
This RN wrote a letter for patient's [employer]. Per husband's request, please mail letter to home address. Address verified. Copy of letter went to be scanned. Husband verbalized understanding.

## 2013-10-02 ENCOUNTER — Other Ambulatory Visit: Payer: Self-pay | Admitting: Oncology

## 2013-10-02 DIAGNOSIS — C569 Malignant neoplasm of unspecified ovary: Secondary | ICD-10-CM

## 2013-10-03 ENCOUNTER — Ambulatory Visit (HOSPITAL_BASED_OUTPATIENT_CLINIC_OR_DEPARTMENT_OTHER): Payer: Medicare Other | Admitting: Oncology

## 2013-10-03 ENCOUNTER — Other Ambulatory Visit (HOSPITAL_BASED_OUTPATIENT_CLINIC_OR_DEPARTMENT_OTHER): Payer: Medicare Other

## 2013-10-03 ENCOUNTER — Telehealth: Payer: Self-pay | Admitting: Oncology

## 2013-10-03 ENCOUNTER — Encounter: Payer: Self-pay | Admitting: Oncology

## 2013-10-03 VITALS — BP 128/64 | HR 86 | Temp 98.0°F | Resp 18 | Ht 67.0 in | Wt 146.4 lb

## 2013-10-03 DIAGNOSIS — C569 Malignant neoplasm of unspecified ovary: Secondary | ICD-10-CM

## 2013-10-03 DIAGNOSIS — C801 Malignant (primary) neoplasm, unspecified: Secondary | ICD-10-CM

## 2013-10-03 DIAGNOSIS — J910 Malignant pleural effusion: Secondary | ICD-10-CM

## 2013-10-03 DIAGNOSIS — Z853 Personal history of malignant neoplasm of breast: Secondary | ICD-10-CM

## 2013-10-03 LAB — COMPREHENSIVE METABOLIC PANEL (CC13)
ALT: 7 U/L (ref 0–55)
ANION GAP: 11 meq/L (ref 3–11)
AST: 16 U/L (ref 5–34)
Albumin: 2.5 g/dL — ABNORMAL LOW (ref 3.5–5.0)
Alkaline Phosphatase: 134 U/L (ref 40–150)
BUN: 14.3 mg/dL (ref 7.0–26.0)
CHLORIDE: 105 meq/L (ref 98–109)
CO2: 22 meq/L (ref 22–29)
CREATININE: 0.7 mg/dL (ref 0.6–1.1)
Calcium: 9.2 mg/dL (ref 8.4–10.4)
Glucose: 124 mg/dl (ref 70–140)
Potassium: 4.3 mEq/L (ref 3.5–5.1)
SODIUM: 138 meq/L (ref 136–145)
TOTAL PROTEIN: 6.6 g/dL (ref 6.4–8.3)
Total Bilirubin: 0.22 mg/dL (ref 0.20–1.20)

## 2013-10-03 LAB — CBC WITH DIFFERENTIAL/PLATELET
BASO%: 0.5 % (ref 0.0–2.0)
Basophils Absolute: 0 10*3/uL (ref 0.0–0.1)
EOS%: 1.4 % (ref 0.0–7.0)
Eosinophils Absolute: 0.1 10*3/uL (ref 0.0–0.5)
HCT: 32.5 % — ABNORMAL LOW (ref 34.8–46.6)
HGB: 9.9 g/dL — ABNORMAL LOW (ref 11.6–15.9)
LYMPH#: 0.4 10*3/uL — AB (ref 0.9–3.3)
LYMPH%: 6.2 % — ABNORMAL LOW (ref 14.0–49.7)
MCH: 27.4 pg (ref 25.1–34.0)
MCHC: 30.5 g/dL — AB (ref 31.5–36.0)
MCV: 89.6 fL (ref 79.5–101.0)
MONO#: 0.6 10*3/uL (ref 0.1–0.9)
MONO%: 8.4 % (ref 0.0–14.0)
NEUT#: 6.1 10*3/uL (ref 1.5–6.5)
NEUT%: 83.5 % — ABNORMAL HIGH (ref 38.4–76.8)
Platelets: 561 10*3/uL — ABNORMAL HIGH (ref 145–400)
RBC: 3.63 10*6/uL — AB (ref 3.70–5.45)
RDW: 20.5 % — ABNORMAL HIGH (ref 11.2–14.5)
WBC: 7.3 10*3/uL (ref 3.9–10.3)

## 2013-10-03 MED ORDER — HYDROCODONE-ACETAMINOPHEN 5-325 MG PO TABS: 1.0000 | ORAL_TABLET | Freq: Four times a day (QID) | ORAL | Status: DC | PRN

## 2013-10-03 NOTE — Telephone Encounter (Signed)
pt needed a copy of sch

## 2013-10-03 NOTE — Patient Instructions (Signed)
Boost Breeze, Resource or Ensure Clear: all clear liquid, juice-like supplements as good as regular Boost or Ensure  Try the hydrocodone one every 6 hours on regular basis for now. You can go up to 1-2 every 4 hours if needed if pain is not controlled.  You should increase laxative daily since using more pain medicine

## 2013-10-03 NOTE — Progress Notes (Signed)
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 She had paracentesis for 4.1 liters on 09-27-13 (last prior for 2.6 liters on 08-31-13), which did help with the abdominal fullness.   Patient complains of abdominal soreness, with occasional increased pain RUQ and back. She is using 1-2 hydrocodone 5-325 in 24 hours, otherwise tries to manage the pain by lying down. Appetite is not great, tho she likes protein smoothies that daughter makes and may want to try clear liquid supplements. Prn home O2 is helpful. She has started folic acid and had Z61 on 8-10 in preparation for Alimta.   She was BRCA negative with genetics testing done at breast cancer diagnosis. She has PAC.    ONCOLOGIC HISTORY   Ovarian cancer (Resolved)   01/06/2012 Initial Diagnosis Ovarian cancer   01/07/2012 - 06/23/2012 Chemotherapy 6 cycles of paciltaxel and carboplatin   05/23/2012 Surgery x-lap with significant residual disease   07/24/2012 - 12/18/2012 Chemotherapy 6 cycles of topotecan and avastin    Ovarian cancer   12/29/2011 Initial Diagnosis Ovarian cancer, IV based on thoracentesis    - 04/21/2012 Chemotherapy 6 cycles of paclitaxel and carboplatin   05/30/2012 Surgery Suboptimal debulking   07/24/2012 - 04/16/2013 Chemotherapy 10 cycles of topotecan and avastin  Patient had 3 sentinel biopsied all of them were negative for metastatic disease. Patient underwent radiation therapy between 06/05/2009 through 07/02/2009. She was then begun on Tamoxifen 20 mg daily in 09/2009. BRCA testing reportedly negative in ~ 2012.  #2 In 12/2011 she received the diagnosis of gynecologic malignancy presenting with abdominal  mass and pleural effusions. Patient was originally hospitalized with shortness of breath in 12/2011. It was discovered she had a malignant pleural effusion (she is status post bilateral Pleurx catheter placement in 12/2011 and left talc pleurodesis by Dr Gilford Raid). She also had malignant ascites. She underwent bilateral thoracentesis and paracentesis procedures during her hospitalization. During her hospitalization she was seen by gynecologic oncology.  #3 Patient began neoadjuvant chemotherapy consisting of Taxol and Carboplatinum. Her first cycle was administered during her hospitalization. She completed 6 cycles of Taxol/Carboplatinum from 01/07/2012 - 06/23/2012. Per patient "very sick with initial chemo and it did not work"  #4. laparotomy in 05/2012 that revealed significant residual disease.  #5 Patient began adjuvant therapy with Topotecan/Avastin starting on 07/24/2012, given thru 06-04-2013. Progression by CA125 marker and CT May 2015. Gemzar began 07-23-13; cycle 2 on 08-06-13, however even reduced dose was not tolerable to patient.   Review of systems as above, also: No fever or symptoms of infection. Extremely dry mouth, not new. No LE swelling. No problems with PAC. No bleeding. Remainder of 10 point Review of Systems negative.  Objective:  Vital signs in last 24 hours: Weight is down 7 lbs from prior to last paracentesis. 128/64, 86/regular, 98, respirations 20 not labored RA.   Alert, oriented and appropriate. Ambulatory without assistance, able to get on and off exam table.  Partial alopecia  HEENT:PERRL, sclerae not icteric. Oral mucosa dry without lesions, posterior pharynx dry and clear.  Neck supple. No JVD.  Lymphatics:no cervical,suraclavicular adenopathy Resp: diminished BS thruout and otherwise clear to auscultation and dullness just in bases bilaterally. Cardio: regular rate and rhythm. No gallop. GI:  soft, slightly tender thruout, distended but not tight, cannot  appreciate mass or organomegaly. Some bowel sounds.  Musculoskeletal/ Extremities: without pitting edema, cords, tenderness Neuro: no change peripheral neuropathy. Otherwise nonfocal Skin without rash, ecchymosis, petechiae Portacath-without erythema or tenderness  Lab Results:  Results for orders placed in visit on 10/03/13  CBC WITH DIFFERENTIAL      Result Value Ref Range   WBC 7.3  3.9 - 10.3 10e3/uL   NEUT# 6.1  1.5 - 6.5 10e3/uL   HGB 9.9 (*) 11.6 - 15.9 g/dL   HCT 32.5 (*) 34.8 - 46.6 %   Platelets 561 (*) 145 - 400 10e3/uL   MCV 89.6  79.5 - 101.0 fL   MCH 27.4  25.1 - 34.0 pg   MCHC 30.5 (*) 31.5 - 36.0 g/dL   RBC 3.63 (*) 3.70 - 5.45 10e6/uL   RDW 20.5 (*) 11.2 - 14.5 %   lymph# 0.4 (*) 0.9 - 3.3 10e3/uL   MONO# 0.6  0.1 - 0.9 10e3/uL   Eosinophils Absolute 0.1  0.0 - 0.5 10e3/uL   Basophils Absolute 0.0  0.0 - 0.1 10e3/uL   NEUT% 83.5 (*) 38.4 - 76.8 %   LYMPH% 6.2 (*) 14.0 - 49.7 %   MONO% 8.4  0.0 - 14.0 %   EOS% 1.4  0.0 - 7.0 %   BASO% 0.5  0.0 - 2.0 %   CMET available after visit normal with exception of albumin 2.5 CA 125 09-24-13 278  Studies/Results:  No results found.  Medications: I have reviewed the patient's current medications. She will try using hydrocodone 5-325 one tablet every 6 hrs and will increase daily laxative with this. She may need sustained release pain medication if this is helpful.  DISCUSSION discussed etiology of malignant ascites and management with intermittent paracenteses or peritoneal catheter if rapidly reoccurs. Discussed goal of chemo to improve and control disease if possible, tho this will not be long term and will not be curative; note patient has expressed that family has not seemed to understand the situation, or perhaps some denial, so that I have tried to be very clear with this discussion. At daughter's request we also discussed intraperitoneal chemotherapy, which neither patient nor I feel that she could tolerate, and  PARP inhibitors which are approved for BRCA + patients. 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 Begin Alimta 8-24, with close follow up after that treatment due to difficulties with previous regimens. Repeat paracenteses prn. Increase hydrocodone to regular schedule and follow up next visit. 2.hypoxemia documented: on home O2 @ 2 liters prn. CT chest in 06-2013 no recurrent effusions. Note had bilateral pleurex catheters at presentation, then left talc pleurodesis only.  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 BRCA testing ~ 2012 reportedly negative.  5.post remote hysterectomy  6. 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 She prefers xerostomia to stopping ditropan.  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 Chemo orders confirmed.     Kwame Ryland P, MD   10/03/2013, 9:15 AM

## 2013-10-08 ENCOUNTER — Ambulatory Visit (HOSPITAL_BASED_OUTPATIENT_CLINIC_OR_DEPARTMENT_OTHER): Payer: Medicare Other | Admitting: *Deleted

## 2013-10-08 ENCOUNTER — Ambulatory Visit (HOSPITAL_BASED_OUTPATIENT_CLINIC_OR_DEPARTMENT_OTHER): Payer: Medicare Other

## 2013-10-08 VITALS — BP 130/90 | HR 82 | Temp 97.3°F | Resp 18

## 2013-10-08 DIAGNOSIS — C801 Malignant (primary) neoplasm, unspecified: Secondary | ICD-10-CM

## 2013-10-08 DIAGNOSIS — C569 Malignant neoplasm of unspecified ovary: Secondary | ICD-10-CM

## 2013-10-08 DIAGNOSIS — J910 Malignant pleural effusion: Secondary | ICD-10-CM

## 2013-10-08 DIAGNOSIS — Z5111 Encounter for antineoplastic chemotherapy: Secondary | ICD-10-CM

## 2013-10-08 LAB — CBC WITH DIFFERENTIAL/PLATELET
BASO%: 0.1 % (ref 0.0–2.0)
BASOS ABS: 0 10*3/uL (ref 0.0–0.1)
EOS%: 0 % (ref 0.0–7.0)
Eosinophils Absolute: 0 10*3/uL (ref 0.0–0.5)
HCT: 30.3 % — ABNORMAL LOW (ref 34.8–46.6)
HEMOGLOBIN: 8.9 g/dL — AB (ref 11.6–15.9)
LYMPH#: 0.6 10*3/uL — AB (ref 0.9–3.3)
LYMPH%: 5.6 % — ABNORMAL LOW (ref 14.0–49.7)
MCH: 27.1 pg (ref 25.1–34.0)
MCHC: 29.4 g/dL — AB (ref 31.5–36.0)
MCV: 92.4 fL (ref 79.5–101.0)
MONO#: 0.6 10*3/uL (ref 0.1–0.9)
MONO%: 6 % (ref 0.0–14.0)
NEUT#: 9.2 10*3/uL — ABNORMAL HIGH (ref 1.5–6.5)
NEUT%: 88.3 % — ABNORMAL HIGH (ref 38.4–76.8)
Platelets: 572 10*3/uL — ABNORMAL HIGH (ref 145–400)
RBC: 3.28 10*6/uL — ABNORMAL LOW (ref 3.70–5.45)
RDW: 19.5 % — ABNORMAL HIGH (ref 11.2–14.5)
WBC: 10.4 10*3/uL — ABNORMAL HIGH (ref 3.9–10.3)
nRBC: 0 % (ref 0–0)

## 2013-10-08 MED ORDER — DEXAMETHASONE SODIUM PHOSPHATE 10 MG/ML IJ SOLN
10.0000 mg | Freq: Once | INTRAMUSCULAR | Status: AC
  Administered 2013-10-08: 10 mg via INTRAVENOUS

## 2013-10-08 MED ORDER — ONDANSETRON 8 MG/NS 50 ML IVPB
INTRAVENOUS | Status: AC
  Filled 2013-10-08: qty 8

## 2013-10-08 MED ORDER — SODIUM CHLORIDE 0.9 % IV SOLN
375.0000 mg/m2 | Freq: Once | INTRAVENOUS | Status: AC
  Administered 2013-10-08: 675 mg via INTRAVENOUS
  Filled 2013-10-08: qty 27

## 2013-10-08 MED ORDER — DEXAMETHASONE SODIUM PHOSPHATE 10 MG/ML IJ SOLN
INTRAMUSCULAR | Status: AC
  Filled 2013-10-08: qty 1

## 2013-10-08 MED ORDER — HEPARIN SOD (PORK) LOCK FLUSH 100 UNIT/ML IV SOLN
500.0000 [IU] | Freq: Once | INTRAVENOUS | Status: AC | PRN
  Administered 2013-10-08: 500 [IU]
  Filled 2013-10-08: qty 5

## 2013-10-08 MED ORDER — ONDANSETRON 8 MG/50ML IVPB (CHCC)
8.0000 mg | Freq: Once | INTRAVENOUS | Status: AC
  Administered 2013-10-08: 8 mg via INTRAVENOUS

## 2013-10-08 MED ORDER — SODIUM CHLORIDE 0.9 % IV SOLN
Freq: Once | INTRAVENOUS | Status: AC
  Administered 2013-10-08: 11:00:00 via INTRAVENOUS

## 2013-10-08 MED ORDER — SODIUM CHLORIDE 0.9 % IJ SOLN
10.0000 mL | INTRAMUSCULAR | Status: DC | PRN
  Administered 2013-10-08: 10 mL
  Filled 2013-10-08: qty 10

## 2013-10-08 NOTE — Patient Instructions (Signed)
Pemetrexed injection What is this medicine? PEMETREXED (PEM e TREX ed) is a chemotherapy drug. This medicine affects cells that are rapidly growing, such as cancer cells and cells in your mouth and stomach. It is usually used to treat lung cancers like non-small cell lung cancer and mesothelioma. It may also be used to treat other cancers. This medicine may be used for other purposes; ask your health care provider or pharmacist if you have questions. COMMON BRAND NAME(S): Alimta What should I tell my health care provider before I take this medicine? They need to know if you have any of these conditions: -if you frequently drink alcohol containing beverages -infection (especially a virus infection such as chickenpox, cold sores, or herpes) -kidney disease -liver disease -low blood counts, like low platelets, red bloods, or white blood cells -an unusual or allergic reaction to pemetrexed, mannitol, other medicines, foods, dyes, or preservatives -pregnant or trying to get pregnant -breast-feeding How should I use this medicine? This drug is given as an infusion into a vein. It is administered in a hospital or clinic by a specially trained health care professional. Talk to your pediatrician regarding the use of this medicine in children. Special care may be needed. Overdosage: If you think you have taken too much of this medicine contact a poison control center or emergency room at once. NOTE: This medicine is only for you. Do not share this medicine with others. What if I miss a dose? It is important not to miss your dose. Call your doctor or health care professional if you are unable to keep an appointment. What may interact with this medicine? -aspirin and aspirin-like medicines -medicines to increase blood counts like filgrastim, pegfilgrastim, sargramostim -methotrexate -NSAIDS, medicines for pain and inflammation, like ibuprofen or naproxen -probenecid -pyrimethamine -vaccines Talk to  your doctor or health care professional before taking any of these medicines: -acetaminophen -aspirin -ibuprofen -ketoprofen -naproxen This list may not describe all possible interactions. Give your health care provider a list of all the medicines, herbs, non-prescription drugs, or dietary supplements you use. Also tell them if you smoke, drink alcohol, or use illegal drugs. Some items may interact with your medicine. What should I watch for while using this medicine? Visit your doctor for checks on your progress. This drug may make you feel generally unwell. This is not uncommon, as chemotherapy can affect healthy cells as well as cancer cells. Report any side effects. Continue your course of treatment even though you feel ill unless your doctor tells you to stop. In some cases, you may be given additional medicines to help with side effects. Follow all directions for their use. Call your doctor or health care professional for advice if you get a fever, chills or sore throat, or other symptoms of a cold or flu. Do not treat yourself. This drug decreases your body's ability to fight infections. Try to avoid being around people who are sick. This medicine may increase your risk to bruise or bleed. Call your doctor or health care professional if you notice any unusual bleeding. Be careful brushing and flossing your teeth or using a toothpick because you may get an infection or bleed more easily. If you have any dental work done, tell your dentist you are receiving this medicine. Avoid taking products that contain aspirin, acetaminophen, ibuprofen, naproxen, or ketoprofen unless instructed by your doctor. These medicines may hide a fever. Call your doctor or health care professional if you get diarrhea or mouth sores. Do not treat   yourself. To protect your kidneys, drink water or other fluids as directed while you are taking this medicine. Men and women must use effective birth control while taking this  medicine. You may also need to continue using effective birth control for a time after stopping this medicine. Do not become pregnant while taking this medicine. Tell your doctor right away if you think that you or your partner might be pregnant. There is a potential for serious side effects to an unborn child. Talk to your health care professional or pharmacist for more information. Do not breast-feed an infant while taking this medicine. This medicine may lower sperm counts. What side effects may I notice from receiving this medicine? Side effects that you should report to your doctor or health care professional as soon as possible: -allergic reactions like skin rash, itching or hives, swelling of the face, lips, or tongue -low blood counts - this medicine may decrease the number of white blood cells, red blood cells and platelets. You may be at increased risk for infections and bleeding. -signs of infection - fever or chills, cough, sore throat, pain or difficulty passing urine -signs of decreased platelets or bleeding - bruising, pinpoint red spots on the skin, black, tarry stools, blood in the urine -signs of decreased red blood cells - unusually weak or tired, fainting spells, lightheadedness -breathing problems, like a dry cough -changes in emotions or moods -chest pain -confusion -diarrhea -high blood pressure -mouth or throat sores or ulcers -pain, swelling, warmth in the leg -pain on swallowing -swelling of the ankles, feet, hands -trouble passing urine or change in the amount of urine -vomiting -yellowing of the eyes or skin Side effects that usually do not require medical attention (report to your doctor or health care professional if they continue or are bothersome): -hair loss -loss of appetite -nausea -stomach upset This list may not describe all possible side effects. Call your doctor for medical advice about side effects. You may report side effects to FDA at  1-800-FDA-1088. Where should I keep my medicine? This drug is given in a hospital or clinic and will not be stored at home. NOTE: This sheet is a summary. It may not cover all possible information. If you have questions about this medicine, talk to your doctor, pharmacist, or health care provider.  2015, Elsevier/Gold Standard. (2007-09-05 13:24:03)  

## 2013-10-09 ENCOUNTER — Telehealth: Payer: Self-pay | Admitting: *Deleted

## 2013-10-09 NOTE — Telephone Encounter (Signed)
[place] for chemotherapy F/U.  Patient is doing well.  Denies n/v.  Denies any new side effects or symptoms.  States she is "s.o.b. But not anymore than usual with this disease.  I have oxygen I use maybe one hours a day".  Bowel and bladder is functioning well.  Eating and drinking well and I instructed to drink 64 oz minimum daily or at least the day before, of and after treatment.  Denies questions at this time and encouraged to call if needed.  Reviewed how to call after hours in the case of an emergency.

## 2013-10-10 ENCOUNTER — Telehealth: Payer: Self-pay | Admitting: *Deleted

## 2013-10-10 ENCOUNTER — Encounter: Payer: Self-pay | Admitting: General Practice

## 2013-10-10 NOTE — Telephone Encounter (Signed)
Called patient. RN spoke with Dr Marko Plume, she says the oxygen would not have caused the symptoms described. Informed patient and told her to be sure to call us if she has more problems with breathing. Verbalized understanding

## 2013-10-10 NOTE — Telephone Encounter (Signed)
Pt called to let us know that [employer] had suggested she wear O2 at night.Pt wore the oxygen last night- does not know how many liters she uses. States she was wheezing during night, had a wet cough and trouble catching her breath. States she is better today and is sitting up in her chair. Continues to have some trouble catching her breath, but states she always has trouble. Denies fever. Pt states she wanted to let us know that she will not be using oxygen at night anymore. Instructed to call us if she feels she is having increased difficulty. Verbalized understanding

## 2013-10-10 NOTE — Progress Notes (Signed)
Spoke with [name] [name] by phone for f/u support.  Per pt, this fourth round of chemo seems to be working, though she is SOB.  Family is a big source of meaning for her, and a new motivator to continue tx is that she is expecting a second granddaughter in January!  She is thrilled and eager.  Reminded pt that I work as a Clinical biochemist in [place] and [facility], [place] her/her dtr [name] [name] to reach out for support when baby comes.  Pt verbalized appreciation for call.  Following, but please also page as needs arise.  Thank you.  [place], [place]

## 2013-10-11 ENCOUNTER — Telehealth: Payer: Self-pay | Admitting: *Deleted

## 2013-10-11 ENCOUNTER — Ambulatory Visit (HOSPITAL_BASED_OUTPATIENT_CLINIC_OR_DEPARTMENT_OTHER): Payer: Medicare Other

## 2013-10-11 ENCOUNTER — Ambulatory Visit (HOSPITAL_COMMUNITY)
Admission: RE | Admit: 2013-10-11 | Discharge: 2013-10-11 | Disposition: A | Discharge status: Home / Self Care | Payer: Medicare Other | Source: Ambulatory Visit | Attending: Nurse Practitioner | Admitting: Nurse Practitioner

## 2013-10-11 ENCOUNTER — Encounter: Payer: Self-pay | Admitting: Nurse Practitioner

## 2013-10-11 ENCOUNTER — Ambulatory Visit (HOSPITAL_BASED_OUTPATIENT_CLINIC_OR_DEPARTMENT_OTHER): Payer: Medicare Other | Admitting: Nurse Practitioner

## 2013-10-11 ENCOUNTER — Other Ambulatory Visit: Payer: Self-pay | Admitting: *Deleted

## 2013-10-11 ENCOUNTER — Telehealth: Payer: Self-pay | Admitting: Nurse Practitioner

## 2013-10-11 VITALS — BP 137/84 | HR 115 | Temp 98.9°F | Resp 16 | Ht 67.0 in | Wt 146.3 lb

## 2013-10-11 VITALS — BP 140/73 | HR 91 | Temp 99.3°F | Resp 26

## 2013-10-11 DIAGNOSIS — C569 Malignant neoplasm of unspecified ovary: Secondary | ICD-10-CM

## 2013-10-11 DIAGNOSIS — C50912 Malignant neoplasm of unspecified site of left female breast: Secondary | ICD-10-CM

## 2013-10-11 DIAGNOSIS — E860 Dehydration: Secondary | ICD-10-CM

## 2013-10-11 DIAGNOSIS — R188 Other ascites: Secondary | ICD-10-CM

## 2013-10-11 DIAGNOSIS — G893 Neoplasm related pain (acute) (chronic): Secondary | ICD-10-CM | POA: Insufficient documentation

## 2013-10-11 DIAGNOSIS — C801 Malignant (primary) neoplasm, unspecified: Secondary | ICD-10-CM

## 2013-10-11 DIAGNOSIS — Z79810 Long term (current) use of selective estrogen receptor modulators (SERMs): Secondary | ICD-10-CM | POA: Insufficient documentation

## 2013-10-11 DIAGNOSIS — Z853 Personal history of malignant neoplasm of breast: Secondary | ICD-10-CM | POA: Insufficient documentation

## 2013-10-11 DIAGNOSIS — I2510 Atherosclerotic heart disease of native coronary artery without angina pectoris: Secondary | ICD-10-CM

## 2013-10-11 DIAGNOSIS — E8809 Other disorders of plasma-protein metabolism, not elsewhere classified: Secondary | ICD-10-CM

## 2013-10-11 DIAGNOSIS — C50919 Malignant neoplasm of unspecified site of unspecified female breast: Secondary | ICD-10-CM

## 2013-10-11 DIAGNOSIS — Z79899 Other long term (current) drug therapy: Secondary | ICD-10-CM | POA: Insufficient documentation

## 2013-10-11 DIAGNOSIS — R509 Fever, unspecified: Secondary | ICD-10-CM

## 2013-10-11 DIAGNOSIS — R0600 Dyspnea, unspecified: Secondary | ICD-10-CM

## 2013-10-11 DIAGNOSIS — Z923 Personal history of irradiation: Secondary | ICD-10-CM | POA: Insufficient documentation

## 2013-10-11 DIAGNOSIS — G4700 Insomnia, unspecified: Secondary | ICD-10-CM

## 2013-10-11 DIAGNOSIS — R112 Nausea with vomiting, unspecified: Secondary | ICD-10-CM

## 2013-10-11 LAB — CBC WITH DIFFERENTIAL/PLATELET
BASO%: 0.1 % (ref 0.0–2.0)
Basophils Absolute: 0 10*3/uL (ref 0.0–0.1)
EOS%: 0.2 % (ref 0.0–7.0)
Eosinophils Absolute: 0 10*3/uL (ref 0.0–0.5)
HCT: 29.1 % — ABNORMAL LOW (ref 34.8–46.6)
HGB: 9.2 g/dL — ABNORMAL LOW (ref 11.6–15.9)
LYMPH%: 3.3 % — AB (ref 14.0–49.7)
MCH: 27.4 pg (ref 25.1–34.0)
MCHC: 31.7 g/dL (ref 31.5–36.0)
MCV: 86.3 fL (ref 79.5–101.0)
MONO#: 0 10*3/uL — ABNORMAL LOW (ref 0.1–0.9)
MONO%: 0.3 % (ref 0.0–14.0)
NEUT#: 10.3 10*3/uL — ABNORMAL HIGH (ref 1.5–6.5)
NEUT%: 96.1 % — ABNORMAL HIGH (ref 38.4–76.8)
PLATELETS: 624 10*3/uL — AB (ref 145–400)
RBC: 3.38 10*6/uL — AB (ref 3.70–5.45)
RDW: 20.2 % — AB (ref 11.2–14.5)
WBC: 10.7 10*3/uL — ABNORMAL HIGH (ref 3.9–10.3)
lymph#: 0.4 10*3/uL — ABNORMAL LOW (ref 0.9–3.3)

## 2013-10-11 LAB — COMPREHENSIVE METABOLIC PANEL (CC13)
ALT: 18 U/L (ref 0–55)
AST: 26 U/L (ref 5–34)
Albumin: 2.5 g/dL — ABNORMAL LOW (ref 3.5–5.0)
Alkaline Phosphatase: 146 U/L (ref 40–150)
Anion Gap: 12 mEq/L — ABNORMAL HIGH (ref 3–11)
BILIRUBIN TOTAL: 0.67 mg/dL (ref 0.20–1.20)
BUN: 19.7 mg/dL (ref 7.0–26.0)
CO2: 23 mEq/L (ref 22–29)
Calcium: 8.8 mg/dL (ref 8.4–10.4)
Chloride: 100 mEq/L (ref 98–109)
Creatinine: 0.6 mg/dL (ref 0.6–1.1)
GLUCOSE: 99 mg/dL (ref 70–140)
Potassium: 4 mEq/L (ref 3.5–5.1)
SODIUM: 135 meq/L — AB (ref 136–145)
Total Protein: 6.7 g/dL (ref 6.4–8.3)

## 2013-10-11 LAB — URINALYSIS, MICROSCOPIC - CHCC
Bilirubin (Urine): NEGATIVE
Blood: NEGATIVE
Glucose: NEGATIVE mg/dL
Ketones: NEGATIVE mg/dL
NITRITE: NEGATIVE
Protein: 30 mg/dL
Specific Gravity, Urine: 1.015 (ref 1.003–1.035)
UROBILINOGEN UR: 0.2 mg/dL (ref 0.2–1)
pH: 6 (ref 4.6–8.0)

## 2013-10-11 MED ORDER — ONDANSETRON 16 MG/50ML IVPB (CHCC)
16.0000 mg | Freq: Once | INTRAVENOUS | Status: AC
  Administered 2013-10-11: 16 mg via INTRAVENOUS

## 2013-10-11 MED ORDER — HEPARIN SOD (PORK) LOCK FLUSH 100 UNIT/ML IV SOLN
500.0000 [IU] | Freq: Once | INTRAVENOUS | Status: AC
  Administered 2013-10-11: 500 [IU] via INTRAVENOUS
  Filled 2013-10-11: qty 5

## 2013-10-11 MED ORDER — SODIUM CHLORIDE 0.9 % IV SOLN
Freq: Once | INTRAVENOUS | Status: AC
  Administered 2013-10-11: 09:00:00 via INTRAVENOUS

## 2013-10-11 MED ORDER — ONDANSETRON 16 MG/50ML IVPB (CHCC)
INTRAVENOUS | Status: AC
  Filled 2013-10-11: qty 16

## 2013-10-11 MED ORDER — LEVOFLOXACIN 500 MG PO TABS: 500.0000 mg | ORAL_TABLET | Freq: Every day | ORAL | Status: DC

## 2013-10-11 MED ORDER — SODIUM CHLORIDE 0.9 % IJ SOLN
10.0000 mL | INTRAMUSCULAR | Status: DC | PRN
  Administered 2013-10-11: 10 mL via INTRAVENOUS
  Filled 2013-10-11: qty 10

## 2013-10-11 NOTE — Progress Notes (Signed)
Patient states her nausea is "all gone" at end of IVF; her respiratory status appears unchanged. Selena Lesser, NP called to chair to visualize patient prior to discharge.   1200 NP at chairside at this time to assess patient prior to discharge. NP aware of 99.3 oral temp and patient instructed to notify clinic if temp increases. Patient verbalizes understanding.

## 2013-10-11 NOTE — Assessment & Plan Note (Signed)
[name] on human remains low.  Patient was encouraged to push protein is much as possible.

## 2013-10-11 NOTE — Assessment & Plan Note (Signed)
Patient received her first cycle of Alimta on Monday, 10/08/2013.  This is cycle every 21 days.

## 2013-10-11 NOTE — Telephone Encounter (Signed)
PT. HAS VOMITED ONCE SINCE RETURNING HOME. VERBAL ORDER AND READ BACK TO CINDEE BACON,NP- PT. NEEDS A TWO VIEW CHEST X-RAY, BLOOD CULTURES X2 AND URINALYSIS. NOTIFIED PT.'S HUSBAND TO BRING PT. TO [facility] FOR CHEST X-RAY. THEN COME TO THIS OFFICE FOR LAB AND SEE CINDEE BACON,NP. PT.'S HUSBAND VOICES UNDERSTANDING. NOTIFIED [facility] THAT PT. TO COME FOR A CHEST X-RAY AND CALL RESULT TO [phone].

## 2013-10-11 NOTE — Patient Instructions (Addendum)
Nausea and Vomiting Nausea is a sick feeling that often comes before throwing up (vomiting). Vomiting is a reflex where stomach contents come out of your mouth. Vomiting can cause severe loss of body fluids (dehydration). Children and elderly adults can become dehydrated quickly, especially if they also have diarrhea. Nausea and vomiting are symptoms of a condition or disease. It is important to find the cause of your symptoms. CAUSES   Direct irritation of the stomach lining. This irritation can result from increased acid production (gastroesophageal reflux disease), infection, food poisoning, taking certain medicines (such as nonsteroidal anti-inflammatory drugs), alcohol use, or tobacco use.  Signals from the brain.These signals could be caused by a headache, heat exposure, an inner ear disturbance, increased pressure in the brain from injury, infection, a tumor, or a concussion, pain, emotional stimulus, or metabolic problems.  An obstruction in the gastrointestinal tract (bowel obstruction).  Illnesses such as diabetes, hepatitis, gallbladder problems, appendicitis, kidney problems, cancer, sepsis, atypical symptoms of a heart attack, or eating disorders.  Medical treatments such as chemotherapy and radiation.  Receiving medicine that makes you sleep (general anesthetic) during surgery. DIAGNOSIS Your caregiver may ask for tests to be done if the problems do not improve after a few days. Tests may also be done if symptoms are severe or if the reason for the nausea and vomiting is not clear. Tests may include:  Urine tests.  Blood tests.  Stool tests.  Cultures (to look for evidence of infection).  X-rays or other imaging studies. Test results can help your caregiver make decisions about treatment or the need for additional tests. TREATMENT You need to stay well hydrated. Drink frequently but in small amounts.You may wish to drink water, sports drinks, clear broth, or eat frozen  ice pops or gelatin dessert to help stay hydrated.When you eat, eating slowly may help prevent nausea.There are also some antinausea medicines that may help prevent nausea. HOME CARE INSTRUCTIONS   Take all medicine as directed by your caregiver.  If you do not have an appetite, do not force yourself to eat. However, you must continue to drink fluids.  If you have an appetite, eat a normal diet unless your caregiver tells you differently.  Eat a variety of complex carbohydrates (rice, wheat, potatoes, bread), lean meats, yogurt, fruits, and vegetables.  Avoid high-fat foods because they are more difficult to digest.  Drink enough water and fluids to keep your urine clear or pale yellow.  If you are dehydrated, ask your caregiver for specific rehydration instructions. Signs of dehydration may include:  Severe thirst.  Dry lips and mouth.  Dizziness.  Dark urine.  Decreasing urine frequency and amount.  Confusion.  Rapid breathing or pulse. SEEK IMMEDIATE MEDICAL CARE IF:   You have blood or brown flecks (like coffee grounds) in your vomit.  You have black or bloody stools.  You have a severe headache or stiff neck.  You are confused.  You have severe abdominal pain.  You have chest pain or trouble breathing.  You do not urinate at least once every 8 hours.  You develop cold or clammy skin.  You continue to vomit for longer than 24 to 48 hours.  You have a fever. MAKE SURE YOU:   Understand these instructions.  Will watch your condition.  Will get help right away if you are not doing well or get worse. Document Released: 02/01/2005 Document Revised: 04/26/2011 Document Reviewed: 07/01/2010 Exit[employer] Patient Information 2015 [employer]. This information is not intended   to replace advice given to you by your health care provider. Make sure you discuss any questions you have with your health care provider. Clear Liquid Diet A clear liquid diet is a  short-term diet that is prescribed to provide the necessary fluid and basic energy you need when you can have nothing else. The clear liquid diet consists of liquids or solids that will become liquid at room temperature. You should be able to see through the liquid. There are many reasons that you may be restricted to clear liquids, such as: When you have a sudden-onset (acute) condition that occurs before or after surgery. To help your body slowly get adjusted to food again after a long period when you were unable to have food. Replacement of fluids when you have a diarrheal disease. When you are going to have certain exams, such as a colonoscopy, in which instruments are inserted inside your body to look at parts of your digestive system. WHAT CAN I HAVE? A clear liquid diet does not provide all the nutrients you need. It is important to choose a variety of the following items to get as many nutrients as possible: Vegetable juices that do not have pulp. Fruit juices and fruit drinks that do not have pulp. Coffee (regular or decaffeinated), tea, or soda at the discretion of your health care provider. Clear bouillon, broth, or strained broth-based soups. High-protein and flavored gelatins. Sugar or honey. Ices or frozen ice pops that do not contain milk. If you are not sure whether you can have certain items, you should ask your health care provider. You may also ask your health care provider if there are any other clear liquid options. Document Released: 02/01/2005 Document Revised: 02/06/2013 Document Reviewed: 12/29/2012 [facility] Patient Information 2015 [place], [place]. This information is not intended to replace advice given to you by your health care provider. Make sure you discuss any questions you have with your health care provider.

## 2013-10-11 NOTE — Assessment & Plan Note (Signed)
Patient suffers with chronic dyspnea; and uses home O2 at 2 L per nasal cannula on an as-needed basis.  She states that she has noticed some increased dyspnea and occasional wheeze; and is also noted a dry, non--productive cough within the past few days.  She was on a 2 L of the oxygen via nasal cannula while in the infusion Center today.

## 2013-10-11 NOTE — Assessment & Plan Note (Signed)
Patient has been experiencing significant nausea and vomiting since receiving her for cycle of Alimta this past Monday, 10/08/2013.  I she states she has been trying Zofran; and it has been ineffective at home.  She was given Zofran 16 mg IV while in the infusion Center; this did appear to manage her nausea quite well.  She was able to sip clear liquids with no increased nausea.  I reviewed need for a clear liquid diet until able to advance as tolerated.  Also confirm the patient does have anti--nausea medications already at home to use if needed.

## 2013-10-11 NOTE — Assessment & Plan Note (Signed)
Patient is complaining of some mild increase in her generalized abdominal discomfort.  Most likely, this is related to increasing ascites.  The patient states that she has no dysuria, hematuria, urgency or frequency.  Patient states that she does take hydrocodone on appearing basis which is effectively managing her pain.

## 2013-10-11 NOTE — Assessment & Plan Note (Signed)
Patient has been experiencing significant nausea and vomiting since receiving her for cycle of Alimta this past Monday, 10/08/2013.  I she states she has been trying Zofran; and it has been ineffective at home.  She was given Zofran 16 mg IV while in the infusion Center; this did appear to manage her nausea quite well.  She was able to sip clear liquids with no increased nausea.  I reviewed need for a clear liquid diet until able to advance as tolerated.  Also confirm the patient does have anti--nausea medications already at home to use if needed.    Patient received 1 L normal saline IV fluid rehydration today due to significant dehydration secondary to chemotherapy-induced nausea and vomiting.  Advised patient that she may want to consider returning to the [facility] tomorrow to receive additional IV fluid rehydration here prior to her paracentesis; or a following her paracentesis.

## 2013-10-11 NOTE — Assessment & Plan Note (Signed)
Patient does appear to be experiencing some increasing ascites.  This may be causing her mild increased dyspnea and abdominal discomfort.  The patient will be scheduled for a therapeutic paracentesis tomorrow afternoon.

## 2013-10-11 NOTE — Assessment & Plan Note (Signed)
Patient was previously diagnosed with left breast cancer/DCIS.  She is status post lumpectomy, radiation therapy, and tamoxifen.  All active treatment was discontinued once the ovarian cancer was diagnosed.

## 2013-10-11 NOTE — Assessment & Plan Note (Signed)
Patient states she has noted some insomnia for the past few days.  Unclear reason for new onset insomnia issues.  Did briefly review bedtime habits to assist in a better sleep.

## 2013-10-11 NOTE — Progress Notes (Signed)
SYMPTOM MANAGEMENT CLINIC    HPI: [name] [name] 68 y.o. female diagnosed with ovarian cancer; and a past history of left breast cancer.  Patient is status post a lumpectomy, radiation therapy, and tamoxifen for her breast cancer.  Patient received her first cycle of Alimta chemotherapy this past Monday, 10/08/2013.  Patient called the cancer Center this morning requesting an urgent care visit.  She states she has been experiencing significant nausea and vomiting up to a 5-6 times per day since receiving her latest a, new chemotherapy regimen.  She denies any diarrhea.  Patient has also been experiencing recurring ascites requiring therapeutic paracentesis.  Last paracentesis performed 09/27/2013 obtained 4.1 L of fluid.  Patient states that her ascites is worsening again; and she is complaining of some mild increase in her dyspnea.  She alternates and occasional wheeze and a dry, non-productive cough.  She denies any chest pain, chest pressure, or pain with inspiration.  She denies any swelling of her extremities.  She denies any recent fever or chills.  She  Patient does state that she's had minimal appetite this past week; and she is attributing this to her nuclear therapy regimen.    HPI  CURRENT THERAPY: Upcoming Treatment Dates - LUNG Pemetrexed (Alimta) q21d  Days with orders from any treatment category:  10/29/2013      SCHEDULING COMMUNICATION      ondansetron (ZOFRAN) IVPB 8 mg      dexamethasone (DECADRON) injection 10 mg      PEMEtrexed (ALIMTA) 675 mg in sodium chloride 0.9 % 100 mL chemo infusion      sodium chloride 0.9 % injection 10 mL      heparin lock flush 100 unit/mL      heparin lock flush 100 unit/mL      alteplase (CATHFLO ACTIVASE) injection 2 mg      sodium chloride 0.9 % injection 3 mL      0.9 %  sodium chloride infusion      TREATMENT CONDITIONS 11/19/2013      [facility] SCHEDULING COMMUNICATION      ONDANSETRON IVPB ORDERABLE ([facility])  DEXAMETHASONE INJECTION ORDERABLE [facility]      PEMEtrexed (ALIMTA) 675 mg in sodium chloride 0.9 % 100 mL chemo infusion      SODIUM CHLORIDE 0.9 % IJ SOLN      HEPARIN SODIUM LOCK FLUSH 100 UNIT/ML IV SOLN      HEPARIN SODIUM LOCK FLUSH 100 UNIT/ML IV SOLN      ALTEPLASE 2 MG IJ SOLR      SODIUM CHLORIDE 0.9 % IJ SOLN      cyanocobalamin ((VITAMIN B-12)) injection 1,000 mcg      SODIUM CHLORIDE 0.9 % IV SOLN      TREATMENT CONDITIONS 12/10/2013      [facility] SCHEDULING COMMUNICATION      ONDANSETRON IVPB ORDERABLE ([facility])      DEXAMETHASONE INJECTION ORDERABLE [facility]      PEMEtrexed (ALIMTA) 675 mg in sodium chloride 0.9 % 100 mL chemo infusion      SODIUM CHLORIDE 0.9 % IJ SOLN      HEPARIN SODIUM LOCK FLUSH 100 UNIT/ML IV SOLN      HEPARIN SODIUM LOCK FLUSH 100 UNIT/ML IV SOLN      ALTEPLASE 2 MG IJ SOLR      SODIUM CHLORIDE 0.9 % IJ SOLN      SODIUM CHLORIDE 0.9 % IV SOLN      TREATMENT CONDITIONS  ROS  Past Medical History  Diagnosis Date  . Interstitial cystitis     on chronic antibiotics  . Hyperlipidemia   . Frequent UTI     on prophylaxis  . 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 CAD (coronary artery disease)     a. s/p CABG 7/13;   b.  LHC 12/01/11:  pLAD 70%, mLAD 40%, CFX 40-50% prior to takeoff of the OM2, oRCA occluded, mid vessel filled via R->R collaterals and distal vessel filled by L->R collaterals, S-OM1/OM2 (small and diffusely dz) with mid 90% stenosis, 90% at OM1 anastomotic site, continuation of OM2 occluded, S-Dx patent, S-PDA occluded, L-LAD ok, EF 55-65%  => Med Rx rec.  [name] Hx of echocardiogram     Echo 5/13: EF 05-39%, grade 2 diastolic dysfunction  . Hypercholesterolemia   . Pleural effusion 12/28/11    s/p pleurx catheter  . GERD (gastroesophageal reflux disease)   . Arthritis     "just a little; lower back" (12/28/11)  . Gout attack 08/2011    related to "stress post OHS"  . Depression   . Breast cancer     "left"; on Tamoxifen  . S/P thoracentesis  12/28/11    "for pleural effusion" (12/28/2011)  . Ovarian cancer   . Tachycardia   . Neuromuscular disorder     peripheral neuropathy  . Ovarian cancer 02/28/2013    Past Surgical History  Procedure Laterality Date  . Breast lumpectomy  04/2009    left  . Cesarean section  1973; 1976  . Muscle release  1960    L neck and chest.; "when I was 13; pneumonia settled in my left neck"  . Coronary artery bypass graft  08/18/2011    Procedure: CORONARY ARTERY BYPASS GRAFTING (CABG);  Surgeon: Gaye Pollack, MD;  Location: [facility];  Service: Open Heart Surgery;  Laterality: N/A;  Coronary Artery Bypass Graft times five utilizing the left internal mammary artery and the left greater saphenous vein harvested endoscopically.  . Abdominal hysterectomy  1976  . Appendectomy  1976  . Portacath placement  01/06/2012    Procedure: INSERTION PORT-A-CATH;  Surgeon: Gaye Pollack, MD;  Location: [place];  Service: Thoracic;  Laterality: Left;  . Chest tube insertion  01/06/2012    Procedure: INSERTION PLEURAL DRAINAGE CATHETER;  Surgeon: Gaye Pollack, MD;  Location: [facility];  Service: Thoracic;  Laterality: Bilateral;  . Removal of pleural drainage catheter Right 04/12/2012    Procedure: MINOR REMOVAL OF PLEURAL DRAINAGE CATHETER;  Surgeon: Gaye Pollack, MD;  Location: [facility];  Service: Thoracic;  Laterality: Right;  . Talc pleurodesis Left 04/12/2012    Procedure: Pietro Cassis;  Surgeon: Gaye Pollack, MD;  Location: [facility];  Service: Thoracic;  Laterality: Left;  . Portacath placement Left 04/17/2012    Procedure: INSERTION PORT-A-CATH;  Surgeon: Gaye Pollack, MD;  Location: [facility] OR;  Service: Thoracic;  Laterality: Left;  . Removal of pleural drainage catheter Left 04/17/2012    Procedure: REMOVAL OF PLEURAL DRAINAGE CATHETER;  Surgeon: Gaye Pollack, MD;  Location: [facility];  Service: Thoracic;  Laterality: Left;  . Port-a-cath removal Left 04/17/2012    Procedure: REMOVAL PORT-A-CATH;  Surgeon: Gaye Pollack, MD;  Location: [facility];  Service: Thoracic;  Laterality: Left;  . Cardiac catheterization    . Laparotomy Bilateral 05/30/2012    Procedure: Resection of umbilical mass, Partial omentectomy;  Surgeon: Alvino Chapel, MD;  Location: [facility];  Service: Gynecology;  Laterality: Bilateral;    has Breast cancer; Care related to current tamoxifen use; Pure hypercholesterolemia; Unstable angina; Fatigue; Subcutaneous nodule; Coronary artery disease; Exertional angina; Pleural effusion, bilateral with Pos Cyt met ca; Tachycardia; Dyspnea; HTN (hypertension); Ascites; Syncopal episodes; Cough; and Ovarian cancer on her problem list.     is allergic to codeine; isosorbide; other; phenothiazines; talwin; yellow jacket venom; ambien; and tegaderm ag mesh.    Medication List       This list is accurate as of: 10/11/13 10:07 AM.  Always use your most recent med list.               ALPRAZolam 0.25 MG tablet  Commonly known as:  XANAX  Take 1 tablet (0.25 mg total) by mouth every 8 (eight) hours as needed for anxiety.     aspirin EC 81 MG tablet  Take 1 tablet (81 mg total) by mouth daily.     bisoprolol 10 MG tablet  Commonly known as:  ZEBETA  TAKE ONE TABLET EACH DAY     dexamethasone 4 MG tablet  Commonly known as:  DECADRON  Take 1 tablet twice a day for 3 days beginning the day prior to Alimta treatment.     docusate sodium 100 MG capsule  Commonly known as:  COLACE  Take 200 mg by mouth 2 (two) times daily.     EPIPEN 2-PAK 0.3 mg/0.3 mL Devi  Generic drug:  EPINEPHrine  Inject 0.3 mg into the muscle daily as needed (allergic reaction).     fluticasone 50 MCG/ACT nasal spray  Commonly known as:  FLONASE  Place 2 sprays into the nose daily as needed for allergies.     folic acid 1 MG tablet  Commonly known as:  FOLVITE  Take 1 tablet (1 mg total) by mouth daily.     gabapentin 100 MG capsule  Commonly known as:  NEURONTIN  TAKE ONE CAPSULE  DAILY      HYDROcodone-acetaminophen 5-325 MG per tablet  Commonly known as:  NORCO/VICODIN  Take 1 tablet by mouth every 6 (six) hours as needed for moderate pain.     lidocaine-prilocaine cream  Commonly known as:  EMLA  Apply topically as needed.     LORazepam 0.5 MG tablet  Commonly known as:  ATIVAN  Take 0.5 mg by mouth every 6 (six) hours as needed for anxiety (take one tab every 6 hours as needed for nausea and vomiting).     magic mouthwash Soln  Take 5 mLs by mouth 4 (four) times daily. Take 5 ml by mouth four times daily as needed.     nitroGLYCERIN 0.4 MG SL tablet  Commonly known as:  NITROSTAT  Place 0.4 mg under the tongue every 5 (five) minutes as needed. Chest pain     omeprazole 20 MG capsule  Commonly known as:  PRILOSEC  Take 20 mg by mouth daily.     ondansetron 8 MG disintegrating tablet  Commonly known as:  ZOFRAN ODT  Take 1 tablet (8 mg total) by mouth every 8 (eight) hours as needed for nausea or vomiting.     ondansetron 8 MG tablet  Commonly known as:  ZOFRAN  Take 1 tablet (8 mg total) by mouth 2 (two) times daily. Take two times a day starting the day after chemo for 2 days. Then take two times a day as needed for nausea or vomiting.     oxybutynin 10 MG 24 hr tablet  Commonly known as:  DITROPAN-XL  Take 10 mg by mouth daily.     Polyethyl Glycol-Propyl Glycol 0.4-0.3 % Soln  Place 1 drop into both eyes every morning. As needed     prochlorperazine 10 MG tablet  Commonly known as:  COMPAZINE  Take 10 mg by mouth every 6 (six) hours as needed for nausea or vomiting.     senna 8.6 MG tablet  Commonly known as:  SENOKOT  Take 2 tablets by mouth 2 (two) times daily.     SF 5000 PLUS 1.1 % Crea dental cream  Generic drug:  sodium fluoride  Place 1.1 % onto teeth at bedtime.     trimethoprim 100 MG tablet  Commonly known as:  TRIMPEX  Take 100 mg by mouth every morning.     valACYclovir 500 MG tablet  Commonly known as:  VALTREX  Take 1 tablet  (500 mg total) by mouth daily.     venlafaxine XR 75 MG 24 hr capsule  Commonly known as:  EFFEXOR-XR  TAKE ONE CAPSULE EACH DAY         PHYSICAL EXAMINATION  Vitals: BP 140/73, HR 91, RR 26, temp 99.3, O2 sat 98% on 2 liters O2.   Physical Exam  Nursing note and vitals reviewed. Constitutional: She is oriented to person, place, and time. She appears dehydrated. She appears unhealthy.  HENT:  Head: Normocephalic and atraumatic.  Eyes: Conjunctivae are normal. Pupils are equal, round, and reactive to light.  Neck: Normal range of motion. Neck supple.  Cardiovascular: Normal rate and regular rhythm.  Exam reveals no friction rub.   No murmur heard. Pulmonary/Chest: Effort normal and breath sounds normal. No respiratory distress. She has no wheezes. She exhibits no tenderness.  Patient noted to have an occasional dry, nonproductive cough.  In no respiratory distress whatsoever on exam.  Abdominal: Soft. Bowel sounds are normal. She exhibits distension. There is tenderness. There is no rebound.  Patient does appear to have some moderate ascites at today.  Abdomen remains the moderately soft; bowel sounds positive.  Patient does have some mild generalized tenderness with palpation on exam.  Musculoskeletal: Normal range of motion. She exhibits no edema and no tenderness.  Lymphadenopathy:    She has no cervical adenopathy.  Neurological: She is alert and oriented to person, place, and time.  Skin: Skin is warm and dry. No rash noted. No erythema.  Psychiatric: Affect normal.    LABORATORY DATA:. CBC  Lab Results  Component Value Date   WBC 10.7* 10/11/2013   RBC 3.38* 10/11/2013   HGB 9.2* 10/11/2013   HCT 29.1* 10/11/2013   PLT 624* 10/11/2013   MCV 86.3 10/11/2013   MCH 27.4 10/11/2013   MCHC 31.7 10/11/2013   RDW 20.2* 10/11/2013   LYMPHSABS 0.4* 10/11/2013   MONOABS 0.0* 10/11/2013   EOSABS 0.0 10/11/2013   BASOSABS 0.0 10/11/2013     CMET  Lab Results  Component Value Date    NA 135* 10/11/2013   K 4.0 10/11/2013   CL 100 09/24/2013   CO2 23 10/11/2013   GLUCOSE 99 10/11/2013   BUN 19.7 10/11/2013   CREATININE 0.6 10/11/2013   CALCIUM 8.8 10/11/2013   PROT 6.7 10/11/2013   ALBUMIN 2.5* 10/11/2013   AST 26 10/11/2013   ALT 18 10/11/2013   ALKPHOS 146 10/11/2013   BILITOT 0.67 10/11/2013   GFRNONAA 87* 06/02/2012   GFRAA >90 06/02/2012     ASSESSMENT/PLAN:    No new assessment & plan notes have been  filed since the last note was generated.   Patient stated understanding of all instructions; and was in agreement with this plan of care. The patient knows to call the clinic with any problems, questions or concerns.   Review/collaboration with Dr Marko Plume regarding patient's visit and plan of care..   Total time spent with patient was 40 minutes;  with greater than  75  percent of that time spent in face to face counseling regarding  her symptoms, intermittent monitoring while in the infusion Center, coordination of paracentesis, and coordination of care and follow up.  Disclaimer: This note was dictated with voice recognition software. Similar sounding words can inadvertently be transcribed and may not be corrected upon review.   Drue Second, NP 10/11/2013

## 2013-10-11 NOTE — Telephone Encounter (Signed)
, °

## 2013-10-12 ENCOUNTER — Ambulatory Visit: Payer: Medicare Other

## 2013-10-12 ENCOUNTER — Ambulatory Visit (HOSPITAL_COMMUNITY)
Admission: RE | Admit: 2013-10-12 | Discharge: 2013-10-12 | Disposition: A | Discharge status: Home / Self Care | Payer: Medicare Other | Source: Ambulatory Visit | Attending: Nurse Practitioner | Admitting: Nurse Practitioner

## 2013-10-12 ENCOUNTER — Encounter: Payer: Self-pay | Admitting: Nurse Practitioner

## 2013-10-12 ENCOUNTER — Other Ambulatory Visit: Payer: Self-pay | Admitting: *Deleted

## 2013-10-12 ENCOUNTER — Ambulatory Visit (HOSPITAL_BASED_OUTPATIENT_CLINIC_OR_DEPARTMENT_OTHER): Payer: Medicare Other

## 2013-10-12 VITALS — BP 132/67 | HR 96 | Temp 99.5°F | Resp 18

## 2013-10-12 DIAGNOSIS — Z79810 Long term (current) use of selective estrogen receptor modulators (SERMs): Secondary | ICD-10-CM | POA: Diagnosis not present

## 2013-10-12 DIAGNOSIS — R188 Other ascites: Secondary | ICD-10-CM | POA: Diagnosis not present

## 2013-10-12 DIAGNOSIS — C569 Malignant neoplasm of unspecified ovary: Secondary | ICD-10-CM

## 2013-10-12 DIAGNOSIS — Z79899 Other long term (current) drug therapy: Secondary | ICD-10-CM | POA: Diagnosis not present

## 2013-10-12 DIAGNOSIS — Z853 Personal history of malignant neoplasm of breast: Secondary | ICD-10-CM | POA: Diagnosis not present

## 2013-10-12 DIAGNOSIS — R509 Fever, unspecified: Secondary | ICD-10-CM | POA: Insufficient documentation

## 2013-10-12 DIAGNOSIS — Z923 Personal history of irradiation: Secondary | ICD-10-CM | POA: Diagnosis not present

## 2013-10-12 DIAGNOSIS — E860 Dehydration: Secondary | ICD-10-CM

## 2013-10-12 DIAGNOSIS — C50919 Malignant neoplasm of unspecified site of unspecified female breast: Secondary | ICD-10-CM

## 2013-10-12 MED ORDER — SODIUM CHLORIDE 0.9 % IV SOLN
1000.0000 mL | Freq: Once | INTRAVENOUS | Status: AC
  Administered 2013-10-12: 10:00:00 via INTRAVENOUS

## 2013-10-12 MED ORDER — ONDANSETRON 16 MG/50ML IVPB (CHCC)
16.0000 mg | Freq: Once | INTRAVENOUS | Status: AC
  Administered 2013-10-12: 16 mg via INTRAVENOUS

## 2013-10-12 MED ORDER — SODIUM CHLORIDE 0.9 % IJ SOLN
10.0000 mL | INTRAMUSCULAR | Status: DC | PRN
  Administered 2013-10-12: 10 mL via INTRAVENOUS
  Filled 2013-10-12: qty 10

## 2013-10-12 MED ORDER — ONDANSETRON 16 MG/50ML IVPB (CHCC)
INTRAVENOUS | Status: AC
  Filled 2013-10-12: qty 16

## 2013-10-12 MED ORDER — SODIUM CHLORIDE 0.9 % IV SOLN: Freq: Once | INTRAVENOUS | Status: DC

## 2013-10-12 MED ORDER — HEPARIN SOD (PORK) LOCK FLUSH 100 UNIT/ML IV SOLN
500.0000 [IU] | Freq: Once | INTRAVENOUS | Status: AC
  Administered 2013-10-12: 500 [IU] via INTRAVENOUS
  Filled 2013-10-12: qty 5

## 2013-10-12 MED ORDER — LORAZEPAM 2 MG/ML IJ SOLN
0.5000 mg | Freq: Once | INTRAMUSCULAR | Status: AC
  Administered 2013-10-12: 0.5 mg via INTRAVENOUS

## 2013-10-12 MED ORDER — LORAZEPAM 2 MG/ML IJ SOLN
INTRAMUSCULAR | Status: AC
  Filled 2013-10-12: qty 1

## 2013-10-12 NOTE — Progress Notes (Signed)
[place]   Chief Complaint  Patient presents with  . Fever    HPI: [name] [name] 68 y.o. female diagnosed with left breast cancer/DCIS S. and ovarian cancer.  The patient is status post lumpectomy, radiation therapy, and tamoxifen for her breast cancer in the past; but all breast cancer treatment was discontinued when she was diagnosed with ovarian cancer.  The patient had presented earlier today to the [facility] with complaints of increasing fatigue/weakness, persistent nausea/vomiting, and significant dehydration.  She was also noted to be developing worsening ascites began.  Patient received a 1 L normal saline IV fluid rehydration while in the cancer Center today; and is scheduled for a therapeutic paracentesis for tomorrow afternoon.  Patient called the cancer Center back this afternoon reporting that she developed a temperature up to 101.2 when she returned back home.  She states that she was in the midst of a severe nausea and vomiting when she was attempting to take her temperature.  She denies any chills.  She denies any other new symptoms whatsoever.   Fever     CURRENT THERAPY: Upcoming Treatment Dates - LUNG Pemetrexed (Alimta) q21d  Days with orders from any treatment category:  10/29/2013      SCHEDULING COMMUNICATION      ondansetron (ZOFRAN) IVPB 8 mg      dexamethasone (DECADRON) injection 10 mg      PEMEtrexed (ALIMTA) 675 mg in sodium chloride 0.9 % 100 mL chemo infusion      sodium chloride 0.9 % injection 10 mL      heparin lock flush 100 unit/mL      heparin lock flush 100 unit/mL      alteplase (CATHFLO ACTIVASE) injection 2 mg      sodium chloride 0.9 % injection 3 mL      0.9 %  sodium chloride infusion      TREATMENT CONDITIONS 11/19/2013      [facility] SCHEDULING COMMUNICATION      ONDANSETRON IVPB ORDERABLE ([facility])      DEXAMETHASONE INJECTION ORDERABLE [facility]      PEMEtrexed (ALIMTA) 675 mg in sodium chloride 0.9 % 100 mL  chemo infusion      SODIUM CHLORIDE 0.9 % IJ SOLN      HEPARIN SODIUM LOCK FLUSH 100 UNIT/ML IV SOLN      HEPARIN SODIUM LOCK FLUSH 100 UNIT/ML IV SOLN      ALTEPLASE 2 MG IJ SOLR      SODIUM CHLORIDE 0.9 % IJ SOLN      cyanocobalamin ((VITAMIN B-12)) injection 1,000 mcg      SODIUM CHLORIDE 0.9 % IV SOLN      TREATMENT CONDITIONS 12/10/2013      [facility] ON[facility] SCHEDULING COMMUNICATION      ONDANSETRON IVPB ORDERABLE ([facility])      DEXAMETHASONE INJECTION ORDERABLE [facility]      PEMEtrexed (ALIMTA) 675 mg in sodium chloride 0.9 % 100 mL chemo infusion      SODIUM CHLORIDE 0.9 % IJ SOLN      HEPARIN SODIUM LOCK FLUSH 100 UNIT/ML IV SOLN      HEPARIN SODIUM LOCK FLUSH 100 UNIT/ML IV SOLN      ALTEPLASE 2 MG IJ SOLR      SODIUM CHLORIDE 0.9 % IJ SOLN      SODIUM CHLORIDE 0.9 % IV SOLN      TREATMENT CONDITIONS    Review of Systems  Constitutional: Positive for fever.  Past Medical History  Diagnosis Date  . Interstitial cystitis     on chronic antibiotics  . Hyperlipidemia   . Frequent UTI     on prophylaxis  . 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 CAD (coronary artery disease)     a. s/p CABG 7/13;   b.  LHC 12/01/11:  pLAD 70%, mLAD 40%, CFX 40-50% prior to takeoff of the OM2, oRCA occluded, mid vessel filled via R->R collaterals and distal vessel filled by L->R collaterals, S-OM1/OM2 (small and diffusely dz) with mid 90% stenosis, 90% at OM1 anastomotic site, continuation of OM2 occluded, S-Dx patent, S-PDA occluded, L-LAD ok, EF 55-65%  => Med Rx rec.  [name] Hx of echocardiogram     Echo 5/13: EF 60-63%, grade 2 diastolic dysfunction  . Hypercholesterolemia   . Pleural effusion 12/28/11    s/p pleurx catheter  . GERD (gastroesophageal reflux disease)   . Arthritis     "just a little; lower back" (12/28/11)  . Gout attack 08/2011    related to "stress post OHS"  . Depression   . Breast cancer     "left"; on Tamoxifen  . S/P thoracentesis 12/28/11    "for pleural effusion" (12/28/2011)  .  Ovarian cancer   . Tachycardia   . Neuromuscular disorder     peripheral neuropathy  . Ovarian cancer 02/28/2013    Past Surgical History  Procedure Laterality Date  . Breast lumpectomy  04/2009    left  . Cesarean section  1973; 1976  . Muscle release  1960    L neck and chest.; "when I was 13; pneumonia settled in my left neck"  . Coronary artery bypass graft  08/18/2011    Procedure: CORONARY ARTERY BYPASS GRAFTING (CABG);  Surgeon: Gaye Pollack, MD;  Location: [facility];  Service: Open Heart Surgery;  Laterality: N/A;  Coronary Artery Bypass Graft times five utilizing the left internal mammary artery and the left greater saphenous vein harvested endoscopically.  . Abdominal hysterectomy  1976  . Appendectomy  1976  . Portacath placement  01/06/2012    Procedure: INSERTION PORT-A-CATH;  Surgeon: Gaye Pollack, MD;  Location: [facility];  Service: Thoracic;  Laterality: Left;  . Chest tube insertion  01/06/2012    Procedure: INSERTION PLEURAL DRAINAGE CATHETER;  Surgeon: Gaye Pollack, MD;  Location: [facility];  Service: Thoracic;  Laterality: Bilateral;  . Removal of pleural drainage catheter Right 04/12/2012    Procedure: MINOR REMOVAL OF PLEURAL DRAINAGE CATHETER;  Surgeon: Gaye Pollack, MD;  Location: [facility];  Service: Thoracic;  Laterality: Right;  . Talc pleurodesis Left 04/12/2012    Procedure: Pietro Cassis;  Surgeon: Gaye Pollack, MD;  Location: [facility];  Service: Thoracic;  Laterality: Left;  . Portacath placement Left 04/17/2012    Procedure: INSERTION PORT-A-CATH;  Surgeon: Gaye Pollack, MD;  Location: [facility];  Service: Thoracic;  Laterality: Left;  . Removal of pleural drainage catheter Left 04/17/2012    Procedure: REMOVAL OF PLEURAL DRAINAGE CATHETER;  Surgeon: Gaye Pollack, MD;  Location: [facility];  Service: Thoracic;  Laterality: Left;  . Port-a-cath removal Left 04/17/2012    Procedure: REMOVAL PORT-A-CATH;  Surgeon: Gaye Pollack, MD;  Location: [facility];  Service: Thoracic;   Laterality: Left;  . Cardiac catheterization    . Laparotomy Bilateral 05/30/2012    Procedure: Resection of umbilical mass, Partial omentectomy;  Surgeon: Alvino Chapel, MD;  Location: [facility];  Service: Gynecology;  Laterality:  Bilateral;    has Breast cancer; Care related to current tamoxifen use; Pure hypercholesterolemia; Unstable angina; Fatigue; Subcutaneous nodule; Coronary artery disease; Exertional angina; Pleural effusion, bilateral with Pos Cyt met ca; Tachycardia; Dyspnea; HTN (hypertension); Ascites; Syncopal episodes; Cough; Ovarian cancer; Neoplasm related pain (acute) (chronic); Insomnia; Nausea with vomiting; Dehydration; Hypoalbuminemia; and Fever on her problem list.     is allergic to codeine; isosorbide; other; phenothiazines; talwin; yellow jacket venom; ambien; and tegaderm ag mesh.    Medication List       This list is accurate as of: 10/11/13 11:59 PM.  Always use your most recent med list.               ALPRAZolam 0.25 MG tablet  Commonly known as:  XANAX  Take 1 tablet (0.25 mg total) by mouth every 8 (eight) hours as needed for anxiety.     aspirin EC 81 MG tablet  Take 1 tablet (81 mg total) by mouth daily.     bisoprolol 10 MG tablet  Commonly known as:  ZEBETA  TAKE ONE TABLET EACH DAY     dexamethasone 4 MG tablet  Commonly known as:  DECADRON  Take 1 tablet twice a day for 3 days beginning the day prior to Alimta treatment.     docusate sodium 100 MG capsule  Commonly known as:  COLACE  Take 200 mg by mouth 2 (two) times daily.     EPIPEN 2-PAK 0.3 mg/0.3 mL Devi  Generic drug:  EPINEPHrine  Inject 0.3 mg into the muscle daily as needed (allergic reaction).     fluticasone 50 MCG/ACT nasal spray  Commonly known as:  FLONASE  Place 2 sprays into the nose daily as needed for allergies.     folic acid 1 MG tablet  Commonly known as:  FOLVITE  Take 1 tablet (1 mg total) by mouth daily.     gabapentin 100 MG capsule  Commonly  known as:  NEURONTIN  TAKE ONE CAPSULE  DAILY     HYDROcodone-acetaminophen 5-325 MG per tablet  Commonly known as:  NORCO/VICODIN  Take 1 tablet by mouth every 6 (six) hours as needed for moderate pain.     levofloxacin 500 MG tablet  Commonly known as:  LEVAQUIN  Take 1 tablet (500 mg total) by mouth daily.     lidocaine-prilocaine cream  Commonly known as:  EMLA  Apply topically as needed.     LORazepam 0.5 MG tablet  Commonly known as:  ATIVAN  Take 0.5 mg by mouth every 6 (six) hours as needed for anxiety (take one tab every 6 hours as needed for nausea and vomiting).     magic mouthwash Soln  Take 5 mLs by mouth 4 (four) times daily. Take 5 ml by mouth four times daily as needed.     nitroGLYCERIN 0.4 MG SL tablet  Commonly known as:  NITROSTAT  Place 0.4 mg under the tongue every 5 (five) minutes as needed. Chest pain     omeprazole 20 MG capsule  Commonly known as:  PRILOSEC  Take 20 mg by mouth daily.     ondansetron 8 MG disintegrating tablet  Commonly known as:  ZOFRAN ODT  Take 1 tablet (8 mg total) by mouth every 8 (eight) hours as needed for nausea or vomiting.     ondansetron 8 MG tablet  Commonly known as:  ZOFRAN  Take 1 tablet (8 mg total) by mouth 2 (two) times daily. Take two times a day  starting the day after chemo for 2 days. Then take two times a day as needed for nausea or vomiting.     oxybutynin 10 MG 24 hr tablet  Commonly known as:  DITROPAN-XL  Take 10 mg by mouth daily.     Polyethyl Glycol-Propyl Glycol 0.4-0.3 % Soln  Place 1 drop into both eyes every morning. As needed     prochlorperazine 10 MG tablet  Commonly known as:  COMPAZINE  Take 10 mg by mouth every 6 (six) hours as needed for nausea or vomiting.     senna 8.6 MG tablet  Commonly known as:  SENOKOT  Take 2 tablets by mouth 2 (two) times daily.     SF 5000 PLUS 1.1 % Crea dental cream  Generic drug:  sodium fluoride  Place 1.1 % onto teeth at bedtime.     trimethoprim  100 MG tablet  Commonly known as:  TRIMPEX  Take 100 mg by mouth every morning.     valACYclovir 500 MG tablet  Commonly known as:  VALTREX  Take 1 tablet (500 mg total) by mouth daily.     venlafaxine XR 75 MG 24 hr capsule  Commonly known as:  EFFEXOR-XR  TAKE ONE CAPSULE EACH DAY         PHYSICAL EXAMINATION  Blood pressure 137/84, pulse 115, temperature 98.9 F (37.2 C), temperature source Oral, resp. rate 16, height _0  (1.702 m), weight 146 lb 4.8 oz (66.361 kg), SpO2 88.00%.  Physical Exam  Nursing note and vitals reviewed. Constitutional: She is oriented to person, place, and time. Vital signs are normal. She appears dehydrated. She appears unhealthy.  Eyes: Conjunctivae are normal. Pupils are equal, round, and reactive to light.  Neck: Normal range of motion. Neck supple.  Cardiovascular: Normal rate, regular rhythm and normal heart sounds.   Pulmonary/Chest: Breath sounds normal. No respiratory distress. She has no wheezes. She has no rales. She exhibits no tenderness.  The patient uses O2 at home on an appearing basis.  I she arrived back at the [facility] this afternoon with no O2.  Patient was placed on 2 L nasal cannula while in the cancer Center this afternoon.  Abdominal: Soft. Bowel sounds are normal. She exhibits distension. There is tenderness. There is no rebound and no guarding.  Musculoskeletal: Normal range of motion. She exhibits no edema and no tenderness.  Neurological: She is alert and oriented to person, place, and time.  Skin: Skin is warm and dry. No rash noted. No erythema.  Psychiatric: Affect normal.    LABORATORY DATA:.  Please see previous I. laboratory data obtained earlier this morning.  ASSESSMENT/PLAN:    Ascites  Assessment & Plan Patient has plans to obtain a therapeutic paracentesis tomorrow afternoon.  See previous note from earlier today for further details regarding issues with ascites.   Ovarian cancer  Assessment &  Plan Patient received her first cycle of Alimta on Monday, 10/08/2013.  This is cycled every 21 days.   Dehydration  Assessment & Plan Patient has plans to return in the morning for additional IV fluid rehydration.   Fever  Assessment & Plan Patient was afebrile while in the cancer Center earlier this morning.  However, patient at did call at the [facility] late this afternoon reporting that she developed a temperature up to 101.2.  She states that she was taking her temperature while in the midst of the being very nauseous and vomiting.  Patient was afebrile when she presented  back to the cancer Center this afternoon.  We did obtain a urinalysis which revealed a small amount of leukocyte esterase and moderate bacteria only.  Was negative for nitrites.  Pending urine culture results.  Also, the a two-view of chest x-ray which revealed interval development of moderate right and partially loculated small left pleural effusions.  Did elect to initiate antibiotic therapy with patient that given her reported fever this afternoon.  Initiated Levaquin 500 mg on a daily basis for a total of 7 days.  Will review a urine culture when resulted.   Patient stated understanding of all instructions; and was in agreement with this plan of care. The patient knows to call the clinic with any problems, questions or concerns.   Review/collaboration with Dr. Marko Plume garding all aspects of patient's visit today.   Total time spent with patient was  25 minutes;  with greater than  this 80 percent of that time spent in face to face counseling regarding  her symptoms, review of plan of care over the weekend is symptoms continue or worsen, and coordination of care and follow up.  Disclaimer: This note was dictated with voice recognition software. Similar sounding words can inadvertently be transcribed and may not be corrected upon review.   Drue Second, NP 10/12/2013

## 2013-10-12 NOTE — Assessment & Plan Note (Signed)
Patient received her first cycle of Alimta on Monday, 10/08/2013.  This is cycled every 21 days.

## 2013-10-12 NOTE — Patient Instructions (Signed)
Dehydration, Adult Dehydration is when you lose more fluids from the body than you take in. Vital organs like the kidneys, brain, and heart cannot function without a proper amount of fluids and salt. Any loss of fluids from the body can cause dehydration.  CAUSES   Vomiting.  Diarrhea.  Excessive sweating.  Excessive urine output.  Fever. SYMPTOMS  Mild dehydration  Thirst.  Dry lips.  Slightly dry mouth. Moderate dehydration  Very dry mouth.  Sunken eyes.  Skin does not bounce back quickly when lightly pinched and released.  Dark urine and decreased urine production.  Decreased tear production.  Headache. Severe dehydration  Very dry mouth.  Extreme thirst.  Rapid, weak pulse (more than 100 beats per minute at rest).  Cold hands and feet.  Not able to sweat in spite of heat and temperature.  Rapid breathing.  Blue lips.  Confusion and lethargy.  Difficulty being awakened.  Minimal urine production.  No tears. DIAGNOSIS  Your caregiver will diagnose dehydration based on your symptoms and your exam. Blood and urine tests will help confirm the diagnosis. The diagnostic evaluation should also identify the cause of dehydration. TREATMENT  Treatment of mild or moderate dehydration can often be done at home by increasing the amount of fluids that you drink. It is best to drink small amounts of fluid more often. Drinking too much at one time can make vomiting worse. Refer to the home care instructions below. Severe dehydration needs to be treated at the hospital where you will probably be given intravenous (IV) fluids that contain water and electrolytes. HOME CARE INSTRUCTIONS   Ask your caregiver about specific rehydration instructions.  Drink enough fluids to keep your urine clear or pale yellow.  Drink small amounts frequently if you have nausea and vomiting.  Eat as you normally do.  Avoid:  Foods or drinks high in sugar.  Carbonated  drinks.  Juice.  Extremely hot or cold fluids.  Drinks with caffeine.  Fatty, greasy foods.  Alcohol.  Tobacco.  Overeating.  Gelatin desserts.  Wash your hands well to avoid spreading bacteria and viruses.  Only take over-the-counter or prescription medicines for pain, discomfort, or fever as directed by your caregiver.  Ask your caregiver if you should continue all prescribed and over-the-counter medicines.  Keep all follow-up appointments with your caregiver. SEEK MEDICAL CARE IF:  You have abdominal pain and it increases or stays in one area (localizes).  You have a rash, stiff neck, or severe headache.  You are irritable, sleepy, or difficult to awaken.  You are weak, dizzy, or extremely thirsty. SEEK IMMEDIATE MEDICAL CARE IF:   You are unable to keep fluids down or you get worse despite treatment.  You have frequent episodes of vomiting or diarrhea.  You have blood or green matter (bile) in your vomit.  You have blood in your stool or your stool looks black and tarry.  You have not urinated in 6 to 8 hours, or you have only urinated a small amount of very dark urine.  You have a fever.  You faint. MAKE SURE YOU:   Understand these instructions.  Will watch your condition.  Will get help right away if you are not doing well or get worse. Document Released: 02/01/2005 Document Revised: 04/26/2011 Document Reviewed: 09/21/2010 Exit[employer] Patient Information 2015 [employer]. This information is not intended to replace advice given to you by your health care provider. Make sure you discuss any questions you have with your health care   provider.  

## 2013-10-12 NOTE — Assessment & Plan Note (Signed)
Patient has plans to obtain a therapeutic paracentesis tomorrow afternoon.  See previous note from earlier today for further details regarding issues with ascites.

## 2013-10-12 NOTE — Assessment & Plan Note (Signed)
Patient was afebrile while in the [facility] earlier this morning.  However, patient at did call at the [facility] late this afternoon reporting that she developed a temperature up to 101.2.  She states that she was taking her temperature while in the midst of the being very nauseous and vomiting.  Patient was afebrile when she presented back to the [facility] this afternoon.  We did obtain a urinalysis which revealed a small amount of leukocyte esterase and moderate bacteria only.  Was negative for nitrites.  Pending urine culture results.  Also, the a two-view of chest x-ray which revealed interval development of moderate right and partially loculated small left pleural effusions.  Did elect to initiate antibiotic therapy with patient that given her reported fever this afternoon.  Initiated Levaquin 500 mg on a daily basis for a total of 7 days.  Will review a urine culture when resulted.

## 2013-10-12 NOTE — Procedures (Signed)
US guided therapeutic paracentesis performed yielding 1.8 liters golden yellow fluid. No immediate complications.

## 2013-10-12 NOTE — Assessment & Plan Note (Signed)
Patient has plans to return in the morning for additional IV fluid rehydration.

## 2013-10-13 ENCOUNTER — Other Ambulatory Visit: Payer: Self-pay | Admitting: Oncology

## 2013-10-15 ENCOUNTER — Telehealth: Payer: Self-pay | Admitting: Oncology

## 2013-10-15 ENCOUNTER — Ambulatory Visit (HOSPITAL_BASED_OUTPATIENT_CLINIC_OR_DEPARTMENT_OTHER): Payer: Medicare Other

## 2013-10-15 ENCOUNTER — Other Ambulatory Visit (HOSPITAL_BASED_OUTPATIENT_CLINIC_OR_DEPARTMENT_OTHER): Payer: Medicare Other

## 2013-10-15 ENCOUNTER — Ambulatory Visit (HOSPITAL_BASED_OUTPATIENT_CLINIC_OR_DEPARTMENT_OTHER): Payer: Medicare Other | Admitting: Oncology

## 2013-10-15 ENCOUNTER — Encounter: Payer: Self-pay | Admitting: Oncology

## 2013-10-15 VITALS — BP 113/71 | HR 111 | Temp 97.8°F | Resp 16 | Ht 67.0 in | Wt 135.2 lb

## 2013-10-15 VITALS — BP 114/61 | HR 91 | Temp 98.7°F | Resp 16

## 2013-10-15 DIAGNOSIS — J910 Malignant pleural effusion: Secondary | ICD-10-CM

## 2013-10-15 DIAGNOSIS — C569 Malignant neoplasm of unspecified ovary: Secondary | ICD-10-CM

## 2013-10-15 DIAGNOSIS — R0902 Hypoxemia: Secondary | ICD-10-CM

## 2013-10-15 DIAGNOSIS — R509 Fever, unspecified: Secondary | ICD-10-CM

## 2013-10-15 DIAGNOSIS — E860 Dehydration: Secondary | ICD-10-CM

## 2013-10-15 DIAGNOSIS — Z853 Personal history of malignant neoplasm of breast: Secondary | ICD-10-CM

## 2013-10-15 DIAGNOSIS — K117 Disturbances of salivary secretion: Secondary | ICD-10-CM

## 2013-10-15 DIAGNOSIS — G609 Hereditary and idiopathic neuropathy, unspecified: Secondary | ICD-10-CM

## 2013-10-15 DIAGNOSIS — Z5189 Encounter for other specified aftercare: Secondary | ICD-10-CM

## 2013-10-15 LAB — COMPREHENSIVE METABOLIC PANEL (CC13)
ALBUMIN: 2.1 g/dL — AB (ref 3.5–5.0)
ALT: 19 U/L (ref 0–55)
AST: 28 U/L (ref 5–34)
Alkaline Phosphatase: 228 U/L — ABNORMAL HIGH (ref 40–150)
Anion Gap: 12 mEq/L — ABNORMAL HIGH (ref 3–11)
BUN: 14.6 mg/dL (ref 7.0–26.0)
CO2: 23 mEq/L (ref 22–29)
Calcium: 8.9 mg/dL (ref 8.4–10.4)
Chloride: 98 mEq/L (ref 98–109)
Creatinine: 0.7 mg/dL (ref 0.6–1.1)
Glucose: 101 mg/dl (ref 70–140)
POTASSIUM: 4.1 meq/L (ref 3.5–5.1)
SODIUM: 133 meq/L — AB (ref 136–145)
TOTAL PROTEIN: 6.7 g/dL (ref 6.4–8.3)
Total Bilirubin: 0.74 mg/dL (ref 0.20–1.20)

## 2013-10-15 LAB — CBC WITH DIFFERENTIAL/PLATELET
BASO%: 1.2 % (ref 0.0–2.0)
Basophils Absolute: 0 10*3/uL (ref 0.0–0.1)
EOS%: 3.7 % (ref 0.0–7.0)
Eosinophils Absolute: 0 10*3/uL (ref 0.0–0.5)
HCT: 30.3 % — ABNORMAL LOW (ref 34.8–46.6)
HGB: 9.2 g/dL — ABNORMAL LOW (ref 11.6–15.9)
LYMPH#: 0.2 10*3/uL — AB (ref 0.9–3.3)
LYMPH%: 25.6 % (ref 14.0–49.7)
MCH: 26.4 pg (ref 25.1–34.0)
MCHC: 30.4 g/dL — AB (ref 31.5–36.0)
MCV: 87.1 fL (ref 79.5–101.0)
MONO#: 0.1 10*3/uL (ref 0.1–0.9)
MONO%: 6.1 % (ref 0.0–14.0)
NEUT#: 0.5 10*3/uL — CL (ref 1.5–6.5)
NEUT%: 63.4 % (ref 38.4–76.8)
NRBC: 0 % (ref 0–0)
Platelets: 290 10*3/uL (ref 145–400)
RBC: 3.48 10*6/uL — AB (ref 3.70–5.45)
RDW: 19.1 % — AB (ref 11.2–14.5)
WBC: 0.8 10*3/uL — CL (ref 3.9–10.3)

## 2013-10-15 MED ORDER — ONDANSETRON 16 MG/50ML IVPB (CHCC)
INTRAVENOUS | Status: AC
  Filled 2013-10-15: qty 16

## 2013-10-15 MED ORDER — SODIUM CHLORIDE 0.9 % IJ SOLN
10.0000 mL | INTRAMUSCULAR | Status: DC | PRN
  Administered 2013-10-15: 10 mL via INTRAVENOUS
  Filled 2013-10-15: qty 10

## 2013-10-15 MED ORDER — PEGFILGRASTIM INJECTION 6 MG/0.6ML
6.0000 mg | Freq: Once | SUBCUTANEOUS | Status: AC
Start: 2013-10-15 — End: 2013-10-15
  Administered 2013-10-15: 6 mg via SUBCUTANEOUS
  Filled 2013-10-15: qty 0.6

## 2013-10-15 MED ORDER — ONDANSETRON 16 MG/50ML IVPB (CHCC)
16.0000 mg | Freq: Once | INTRAVENOUS | Status: AC
  Administered 2013-10-15: 16 mg via INTRAVENOUS

## 2013-10-15 MED ORDER — SODIUM CHLORIDE 0.9 % IV SOLN
Freq: Once | INTRAVENOUS | Status: AC
  Administered 2013-10-15: 12:00:00 via INTRAVENOUS

## 2013-10-15 MED ORDER — HEPARIN SOD (PORK) LOCK FLUSH 100 UNIT/ML IV SOLN
500.0000 [IU] | Freq: Once | INTRAVENOUS | Status: AC
  Administered 2013-10-15: 500 [IU] via INTRAVENOUS
  Filled 2013-10-15: qty 5

## 2013-10-15 NOTE — Patient Instructions (Signed)
Pegfilgrastim injection What is this medicine? PEGFILGRASTIM (peg fil GRA stim) is a long-acting granulocyte colony-stimulating factor that stimulates the growth of neutrophils, a type of white blood cell important in the body's fight against infection. It is used to reduce the incidence of fever and infection in patients with certain types of cancer who are receiving chemotherapy that affects the bone marrow. This medicine may be used for other purposes; ask your health care provider or pharmacist if you have questions. COMMON BRAND NAME(S): Neulasta What should I tell my health care provider before I take this medicine? They need to know if you have any of these conditions: -latex allergy -ongoing radiation therapy -sickle cell disease -skin reactions to acrylic adhesives (On-Body Injector only) -an unusual or allergic reaction to pegfilgrastim, filgrastim, other medicines, foods, dyes, or preservatives -pregnant or trying to get pregnant -breast-feeding How should I use this medicine? This medicine is for injection under the skin. If you get this medicine at home, you will be taught how to prepare and give the pre-filled syringe or how to use the On-body Injector. Refer to the patient Instructions for Use for detailed instructions. Use exactly as directed. Take your medicine at regular intervals. Do not take your medicine more often than directed. It is important that you put your used needles and syringes in a special sharps container. Do not put them in a trash can. If you do not have a sharps container, call your pharmacist or healthcare provider to get one. Talk to your pediatrician regarding the use of this medicine in children. Special care may be needed. Overdosage: If you think you have taken too much of this medicine contact a poison control center or emergency room at once. NOTE: This medicine is only for you. Do not share this medicine with others. What if I miss a dose? It is  important not to miss your dose. Call your doctor or health care professional if you miss your dose. If you miss a dose due to an On-body Injector failure or leakage, a new dose should be administered as soon as possible using a single prefilled syringe for manual use. What may interact with this medicine? Interactions have not been studied. Give your health care provider a list of all the medicines, herbs, non-prescription drugs, or dietary supplements you use. Also tell them if you smoke, drink alcohol, or use illegal drugs. Some items may interact with your medicine. This list may not describe all possible interactions. Give your health care provider a list of all the medicines, herbs, non-prescription drugs, or dietary supplements you use. Also tell them if you smoke, drink alcohol, or use illegal drugs. Some items may interact with your medicine. What should I watch for while using this medicine? You may need blood work done while you are taking this medicine. If you are going to need a MRI, CT scan, or other procedure, tell your doctor that you are using this medicine (On-Body Injector only). What side effects may I notice from receiving this medicine? Side effects that you should report to your doctor or health care professional as soon as possible: -allergic reactions like skin rash, itching or hives, swelling of the face, lips, or tongue -dizziness -fever -pain, redness, or irritation at site where injected -pinpoint red spots on the skin -shortness of breath or breathing problems -stomach or side pain, or pain at the shoulder -swelling -tiredness -trouble passing urine Side effects that usually do not require medical attention (report to your doctor   or health care professional if they continue or are bothersome): -bone pain -muscle pain This list may not describe all possible side effects. Call your doctor for medical advice about side effects. You may report side effects to FDA at  1-800-FDA-1088. Where should I keep my medicine? Keep out of the reach of children. Store pre-filled syringes in a refrigerator between 2 and 8 degrees C (36 and 46 degrees F). Do not freeze. Keep in carton to protect from light. Throw away this medicine if it is left out of the refrigerator for more than 48 hours. Throw away any unused medicine after the expiration date. NOTE: This sheet is a summary. It may not cover all possible information. If you have questions about this medicine, talk to your doctor, pharmacist, or health care provider.  2015, Elsevier/Gold Standard. (2013-05-03 16:14:05) Dehydration, Adult Dehydration is when you lose more fluids from the body than you take in. Vital organs like the kidneys, brain, and heart cannot function without a proper amount of fluids and salt. Any loss of fluids from the body can cause dehydration.  CAUSES   Vomiting.  Diarrhea.  Excessive sweating.  Excessive urine output.  Fever. SYMPTOMS  Mild dehydration  Thirst.  Dry lips.  Slightly dry mouth. Moderate dehydration  Very dry mouth.  Sunken eyes.  Skin does not bounce back quickly when lightly pinched and released.  Dark urine and decreased urine production.  Decreased tear production.  Headache. Severe dehydration  Very dry mouth.  Extreme thirst.  Rapid, weak pulse (more than 100 beats per minute at rest).  Cold hands and feet.  Not able to sweat in spite of heat and temperature.  Rapid breathing.  Blue lips.  Confusion and lethargy.  Difficulty being awakened.  Minimal urine production.  No tears. DIAGNOSIS  Your caregiver will diagnose dehydration based on your symptoms and your exam. Blood and urine tests will help confirm the diagnosis. The diagnostic evaluation should also identify the cause of dehydration. TREATMENT  Treatment of mild or moderate dehydration can often be done at home by increasing the amount of fluids that you drink. It is  best to drink small amounts of fluid more often. Drinking too much at one time can make vomiting worse. Refer to the home care instructions below. Severe dehydration needs to be treated at the hospital where you will probably be given intravenous (IV) fluids that contain water and electrolytes. HOME CARE INSTRUCTIONS   Ask your caregiver about specific rehydration instructions.  Drink enough fluids to keep your urine clear or pale yellow.  Drink small amounts frequently if you have nausea and vomiting.  Eat as you normally do.  Avoid:  Foods or drinks high in sugar.  Carbonated drinks.  Juice.  Extremely hot or cold fluids.  Drinks with caffeine.  Fatty, greasy foods.  Alcohol.  Tobacco.  Overeating.  Gelatin desserts.  Wash your hands well to avoid spreading bacteria and viruses.  Only take over-the-counter or prescription medicines for pain, discomfort, or fever as directed by your caregiver.  Ask your caregiver if you should continue all prescribed and over-the-counter medicines.  Keep all follow-up appointments with your caregiver. SEEK MEDICAL CARE IF:  You have abdominal pain and it increases or stays in one area (localizes).  You have a rash, stiff neck, or severe headache.  You are irritable, sleepy, or difficult to awaken.  You are weak, dizzy, or extremely thirsty. SEEK IMMEDIATE MEDICAL CARE IF:   You are unable to keep   fluids down or you get worse despite treatment.  You have frequent episodes of vomiting or diarrhea.  You have blood or green matter (bile) in your vomit.  You have blood in your stool or your stool looks black and tarry.  You have not urinated in 6 to 8 hours, or you have only urinated a small amount of very dark urine.  You have a fever.  You faint. MAKE SURE YOU:   Understand these instructions.  Will watch your condition.  Will get help right away if you are not doing well or get worse. Document Released:  02/01/2005 Document Revised: 04/26/2011 Document Reviewed: 09/21/2010 ExitCare Patient Information 2015 [employer]. This information is not intended to replace advice given to you by your health care provider. Make sure you discuss any questions you have with your health care provider.  

## 2013-10-15 NOTE — Patient Instructions (Signed)
You must take folic acid (folate) today and daily. Side effects of alimta will be worse without this.  Get Sarna lotion from [place], as this is usually helpful for itching rash from alimta, at least 3-4x daily or more if needed.  You can also use hydrocortisone cream 3x daily if that helps. You can take benadryl 25 mg every 6 hrs if needed also for the itching. Take Claritin once daily starting today both for this itch and for the neulasta shot aches.  [name] [name] can order the Resource or [employer] or Ensure Clear, which are all clear liquid supplements like juice.  Use Biotine mouthwash several times daily in addition to magic mouthwash, to keep raw areas in mouth clean.  Call if temperature >=100.5 or other symptoms of infection while white blood count is low.

## 2013-10-15 NOTE — Progress Notes (Signed)
OFFICE PROGRESS NOTE   10/15/2013   Physicians:E.Knapp (PCP), Nancy Marus, Rolm Bookbinder, Marlinda Mike, (B.Bartle), Clois Comber, Karl Stonecipher   INTERVAL HISTORY:  Patient is seen, together with husband, feeling much worse since first Alimta given 10-08-13 in palliative attempt for progressive ovarian cancer. She had B12 and began folate prior to treatment, had no difficulty with infusion itself and was feeling well at time of RN follow up call on 8-25. She was seen as a walk in at this office on 8-27, with IVF given then and again on 8-28. She reported fever at home on 8-27, pancultured, CXR done and begun on empiric levaquin. She had paracentesis for 1.8 liters on 8-28.  Patient is neutropenic today, but has had no further fevers. She is weak, has some mouth soreness and new puritic rash especially back of neck. Paracentesis did not improve abdominal symptoms much. She is using O2 at home. She has had nausea and some vomiting. Despite instructions, she has not been taking oral folate since the Alimta was given.  She has PAC She is BRCA negative.   ONCOLOGIC HISTORY   Ovarian cancer (Resolved)   01/06/2012 Initial Diagnosis Ovarian cancer   01/07/2012 - 06/23/2012 Chemotherapy 6 cycles of paciltaxel and carboplatin   05/23/2012 Surgery x-lap with significant residual disease   07/24/2012 - 12/18/2012 Chemotherapy 6 cycles of topotecan and avastin    Ovarian cancer   12/29/2011 Initial Diagnosis Ovarian cancer, IV based on thoracentesis    - 04/21/2012 Chemotherapy 6 cycles of paclitaxel and carboplatin   05/30/2012 Surgery Suboptimal debulking   07/24/2012 - 04/16/2013 Chemotherapy 10 cycles of topotecan and avastin  Patient had 3 sentinel nodes biopsied, all negative. Patient underwent radiation therapy between 06/05/2009 through 07/02/2009. She was then begun on Tamoxifen 20 mg daily in 09/2009. BRCA testing reportedly negative in ~ 2012.  #2 In 12/2011 she received the  diagnosis of gynecologic malignancy presenting with abdominal mass and pleural effusions. Patient was originally hospitalized with shortness of breath in 12/2011. It was discovered she had a malignant pleural effusion (she is status post bilateral Pleurx catheter placement in 12/2011 and left talc pleurodesis by Dr Gilford Raid). She also had malignant ascites. She underwent bilateral thoracentesis and paracentesis procedures during her hospitalization. During her hospitalization she was seen by gynecologic oncology.  #3 Patient began neoadjuvant chemotherapy consisting of Taxol and Carboplatinum. Her first cycle was administered during her hospitalization. She completed 6 cycles of Taxol/Carboplatinum from 01/07/2012 - 06/23/2012. Per patient "very sick with initial chemo and it did not work"  #4. laparotomy in 05/2012 that revealed significant residual disease.  #5 Patient began adjuvant therapy with Topotecan/Avastin starting on 07/24/2012, given thru 06-04-2013. Progression by CA125 marker and CT May 2015. Gemzar began 07-23-13; cycle 2 on 08-06-13, however even reduced dose was not tolerable to patient such that this was discontinued after second treatment on 08-06-13. She requested additional treatment and had improved enough to allow first Alimta on 10-08-13, tho needed several paracenteses for malignant ascites during that time. She was neutropenic by day 8 cycle 1 Alimta.   Review of systems as above, also: No diarrhea. No productive cough. No new or different pain. No LE swelling. No problems with PAC. Remainder of 10 point Review of Systems negative.  Objective:  Vital signs in last 24 hours:  BP 113/71  Pulse 111  Temp(Src) 97.8 F (36.6 C) (Oral)  Resp 16  Ht $R'5\' 7"'gb$  (1.702 m)  Wt 135 lb 3.2 oz (  61.326 kg)  BMI 21.17 kg/m2 weight is down 13 lbs, in part from paracenteses.  Alert, oriented and appropriate. In wheelchair. Pale, not icteric or cyanotic. Mouth very dry. Respirations not  labored RA. Partial alopecia  HEENT:PERRL, sclerae not icteric. Oral mucosa dry with tiny ulcerations especially buccal mucosa, no clear thrush. Lips dry , chelosis at corners. Neck supple. No JVD.  Lymphatics:no cervical,suraclavicular adenopathy Resp: absent BS and dull to percussion lower 1/3 right, otherwise diminished BS thruout, no wheezes or rales. Cardio: tachy, regular rate and rhythm. No gallop. Clear heart sounds GI: soft, not tender to gentle exam, distended but not tight. A few bowel sounds. Surgical incision not remarkable. Musculoskeletal/ Extremities: without pitting edema, cords, tenderness Neuro: no peripheral neuropathy. Otherwise nonfocal Skin confluent slight raised erythematous rash lower scalp posteriorly, posterior neck and upper back, + a few scattered areas anterior chest - consistent with Alimta rash Portacath-without erythema or tenderness  Lab Results:  Results for orders placed in visit on 10/15/13  CBC WITH DIFFERENTIAL      Result Value Ref Range   WBC 0.8 (*) 3.9 - 10.3 10e3/uL   NEUT# 0.5 (*) 1.5 - 6.5 10e3/uL   HGB 9.2 (*) 11.6 - 15.9 g/dL   HCT 30.3 (*) 34.8 - 46.6 %   Platelets 290  145 - 400 10e3/uL   MCV 87.1  79.5 - 101.0 fL   MCH 26.4  25.1 - 34.0 pg   MCHC 30.4 (*) 31.5 - 36.0 g/dL   RBC 3.48 (*) 3.70 - 5.45 10e6/uL   RDW 19.1 (*) 11.2 - 14.5 %   lymph# 0.2 (*) 0.9 - 3.3 10e3/uL   MONO# 0.1  0.1 - 0.9 10e3/uL   Eosinophils Absolute 0.0  0.0 - 0.5 10e3/uL   Basophils Absolute 0.0  0.0 - 0.1 10e3/uL   NEUT% 63.4  38.4 - 76.8 %   LYMPH% 25.6  14.0 - 49.7 %   MONO% 6.1  0.0 - 14.0 %   EOS% 3.7  0.0 - 7.0 %   BASO% 1.2  0.0 - 2.0 %   nRBC 0  0 - 0 %    CMET available after visit normal with exception of Na 133, AP higher at 228, alb 2.1 Urine culture pending from 8-27  Studies/Results: CHEST 2 VIEW 10-11-13 COMPARISON: Chest radiograph 03/29/2013  FINDINGS:  Left chest wall Port-A-Cath is present with tip projecting over the   superior cavoatrial junction. Stable cardiac and mediastinal  contours status post median sternotomy. Interval development of a  moderate right and partially loculated small left pleural effusion.  Underlying heterogeneous opacities. No definite pneumothorax. Mid  thoracic spine degenerative change.  IMPRESSION:  Interval development of moderate right and partially loculated small  left pleural effusions. Underlying opacities may represent  atelectasis or infection.    Medications: I have reviewed the patient's current medications. Since she is neutropenic she will continue levaquin. She agrees to [employer], given now. Use Sarna lotion several times daily (MD spoke directly with pharmacist at [facility], available there) and can also use OTC hydrocortisone cream if helpful. MMW alternating with BIotine mouthwash. Resume folate today and daily. Claritin daily for itch. IVF given after MD visit today, with IV zofran  DISCUSSION. Discussed neutropenic precautions. Written and oral instructions given to patient and husband for meds above. She had bilateral chest tubes and left talc pleurodesis11-2013, which she tells me was the most painful procedure she has ever had. She may need thoracentesis or even pleurex drain,  but with that experience will not try sclerosis on right. Patient tells me that she wants to stop chemotherapy, which is appropriate given extent of her disease, poor tolerance and poor PS. We have discussed fact that she has tried very hard with the chemotherapy, but certainly this is not tolerable to continue. I will see her again next week; she would be appropriate for Hospice since chemo is discontinued, and I will discuss that.  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 Cycle 1  Alimta 8-24, unfortunately significant side effects which we will try to resolve as above, now agrees to no further chemo. Neutropenia post neulasta now oral mucositis and Alimta rash as above. 2.hypoxemia: related to this disease + long past tobacco.  on home O2 @ 2 liters prn. R>L pleural effusion by CXR this week. Note had bilateral pleurex catheters at presentation, then left talc pleurodesis only.  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 BRCA testing ~ 2012 reportedly negative.  5.post remote hysterectomy  6. 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 She prefers xerostomia to stopping ditropan.  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 Time spent 35 min including >50% counseling and coordination of care    Mercy Leppla P, MD   10/15/2013, 10:14 AM

## 2013-10-15 NOTE — Telephone Encounter (Signed)
, °

## 2013-10-16 LAB — URINE CULTURE

## 2013-10-17 ENCOUNTER — Other Ambulatory Visit: Payer: Self-pay | Admitting: *Deleted

## 2013-10-17 DIAGNOSIS — C569 Malignant neoplasm of unspecified ovary: Secondary | ICD-10-CM

## 2013-10-17 LAB — CULTURE, BLOOD (SINGLE)

## 2013-10-17 MED ORDER — OMEPRAZOLE 20 MG PO CPDR: 20.0000 mg | DELAYED_RELEASE_CAPSULE | Freq: Every day | ORAL | Status: DC

## 2013-10-18 ENCOUNTER — Ambulatory Visit (HOSPITAL_BASED_OUTPATIENT_CLINIC_OR_DEPARTMENT_OTHER): Payer: Medicare Other | Admitting: Adult Health

## 2013-10-18 ENCOUNTER — Encounter: Payer: Self-pay | Admitting: Adult Health

## 2013-10-18 ENCOUNTER — Telehealth: Payer: Self-pay | Admitting: *Deleted

## 2013-10-18 ENCOUNTER — Telehealth: Payer: Self-pay | Admitting: Oncology

## 2013-10-18 ENCOUNTER — Other Ambulatory Visit (HOSPITAL_BASED_OUTPATIENT_CLINIC_OR_DEPARTMENT_OTHER): Payer: Medicare Other

## 2013-10-18 VITALS — BP 119/66 | HR 103 | Temp 98.3°F | Resp 18 | Ht 67.0 in | Wt 134.4 lb

## 2013-10-18 DIAGNOSIS — C569 Malignant neoplasm of unspecified ovary: Secondary | ICD-10-CM

## 2013-10-18 DIAGNOSIS — Z23 Encounter for immunization: Secondary | ICD-10-CM

## 2013-10-18 DIAGNOSIS — G620 Drug-induced polyneuropathy: Secondary | ICD-10-CM

## 2013-10-18 DIAGNOSIS — J910 Malignant pleural effusion: Secondary | ICD-10-CM

## 2013-10-18 DIAGNOSIS — E860 Dehydration: Secondary | ICD-10-CM

## 2013-10-18 LAB — COMPREHENSIVE METABOLIC PANEL (CC13)
ALK PHOS: 211 U/L — AB (ref 40–150)
ALT: 13 U/L (ref 0–55)
AST: 19 U/L (ref 5–34)
Albumin: 2.3 g/dL — ABNORMAL LOW (ref 3.5–5.0)
Anion Gap: 13 mEq/L — ABNORMAL HIGH (ref 3–11)
BUN: 16.4 mg/dL (ref 7.0–26.0)
CALCIUM: 8.8 mg/dL (ref 8.4–10.4)
CHLORIDE: 101 meq/L (ref 98–109)
CO2: 23 mEq/L (ref 22–29)
CREATININE: 0.7 mg/dL (ref 0.6–1.1)
Glucose: 106 mg/dl (ref 70–140)
Potassium: 3.6 mEq/L (ref 3.5–5.1)
Sodium: 137 mEq/L (ref 136–145)
Total Bilirubin: 0.42 mg/dL (ref 0.20–1.20)
Total Protein: 6.8 g/dL (ref 6.4–8.3)

## 2013-10-18 LAB — CBC WITH DIFFERENTIAL/PLATELET
BASO%: 0.2 % (ref 0.0–2.0)
BASOS ABS: 0 10*3/uL (ref 0.0–0.1)
EOS%: 0.7 % (ref 0.0–7.0)
Eosinophils Absolute: 0.1 10*3/uL (ref 0.0–0.5)
HEMATOCRIT: 29 % — AB (ref 34.8–46.6)
HGB: 8.8 g/dL — ABNORMAL LOW (ref 11.6–15.9)
LYMPH#: 0.5 10*3/uL — AB (ref 0.9–3.3)
LYMPH%: 3.1 % — ABNORMAL LOW (ref 14.0–49.7)
MCH: 26.2 pg (ref 25.1–34.0)
MCHC: 30.2 g/dL — AB (ref 31.5–36.0)
MCV: 86.7 fL (ref 79.5–101.0)
MONO#: 0.9 10*3/uL (ref 0.1–0.9)
MONO%: 6.1 % (ref 0.0–14.0)
NEUT#: 13.9 10*3/uL — ABNORMAL HIGH (ref 1.5–6.5)
NEUT%: 89.9 % — ABNORMAL HIGH (ref 38.4–76.8)
Platelets: 173 10*3/uL (ref 145–400)
RBC: 3.35 10*6/uL — ABNORMAL LOW (ref 3.70–5.45)
RDW: 20.4 % — ABNORMAL HIGH (ref 11.2–14.5)
WBC: 15.5 10*3/uL — ABNORMAL HIGH (ref 3.9–10.3)

## 2013-10-18 MED ORDER — INFLUENZA VAC SPLIT QUAD 0.5 ML IM SUSY
0.5000 mL | PREFILLED_SYRINGE | Freq: Once | INTRAMUSCULAR | Status: AC
  Administered 2013-10-18: 0.5 mL via INTRAMUSCULAR
  Filled 2013-10-18: qty 0.5

## 2013-10-18 NOTE — Progress Notes (Signed)
OFFICE PROGRESS NOTE   10/18/2013   Physicians:E.Knapp (PCP), Nancy Marus, Rolm Bookbinder, Marlinda Mike, (B.Bartle), Clois Comber, Karl Stonecipher   INTERVAL HISTORY:  As per Dr. Camila Li last note: "Patient is seen, together with husband, feeling much worse since first Alimta given 10-08-13 in palliative attempt for progressive ovarian cancer. She had B12 and began folate prior to treatment, had no difficulty with infusion itself and was feeling well at time of RN follow up call on 8-25. She was seen as a walk in at this office on 8-27, with IVF given then and again on 8-28. She reported fever at home on 8-27, pancultured, CXR done and begun on empiric levaquin. She had paracentesis for 1.8 liters on 8-28."  [name] [name] last followed up with Dr. Marko Plume on Monday, 10/15/13.  Her weight was 135 then and she is now down to 134 today.  She feels like her nausea is improving.  She is taking Ondansetron ODT, and she feels like it stops her nausea for the most part.  Her nausea returns the next day.  She does have difficulty with keeping food down.  She feels like her abdomen is not getting any more swollen since her last paracentesis.  She is drinking boost and ensure, about one can per day.  She sips on a can all day long because if she drinks it any [name] she throws it back up.  She is fatigued.  She feels weak.  She tells me that IV fluids help her slightly.  She by no means can, "run up and down the halls" but she doesn't feel so badly when she receives them.   She does take Hydrocone when she needs it.  Her pain is mostly in her abdomen.  She takes the pain medication once every 3 days.  She also takes Ibuprofen as an adjunct to her pain.  She tells me that she has been experiencing anhedonia lately.  She hadn't been able to keep her Effexor down.  She was able to add that back a few days ago.  She doesn't want IV fluids today, because she just wants to go home and sleep.  She would  like them tomorrow.   She has PAC She is BRCA negative.   ONCOLOGIC HISTORY   Ovarian cancer (Resolved)   01/06/2012 Initial Diagnosis Ovarian cancer   01/07/2012 - 06/23/2012 Chemotherapy 6 cycles of paciltaxel and carboplatin   05/23/2012 Surgery x-lap with significant residual disease   07/24/2012 - 12/18/2012 Chemotherapy 6 cycles of topotecan and avastin    Ovarian cancer   12/29/2011 Initial Diagnosis Ovarian cancer, IV based on thoracentesis    - 04/21/2012 Chemotherapy 6 cycles of paclitaxel and carboplatin   05/30/2012 Surgery Suboptimal debulking   07/24/2012 - 04/16/2013 Chemotherapy 10 cycles of topotecan and avastin  Patient had 3 sentinel nodes biopsied, all negative. Patient underwent radiation therapy between 06/05/2009 through 07/02/2009. She was then begun on Tamoxifen 20 mg daily in 09/2009. BRCA testing reportedly negative in ~ 2012.   #2 In 12/2011 she received the diagnosis of gynecologic malignancy presenting with abdominal mass and pleural effusions. Patient was originally hospitalized with shortness of breath in 12/2011. It was discovered she had a malignant pleural effusion (she is status post bilateral Pleurx catheter placement in 12/2011 and left talc pleurodesis by Dr Gilford Raid). She also had malignant ascites. She underwent bilateral thoracentesis and paracentesis procedures during her hospitalization. During her hospitalization she was seen by gynecologic oncology.   #3  Patient began neoadjuvant chemotherapy consisting of Taxol and Carboplatinum. Her first cycle was administered during her hospitalization. She completed 6 cycles of Taxol/Carboplatinum from 01/07/2012 - 06/23/2012. Per patient "very sick with initial chemo and it did not work"   #4. laparotomy in 05/2012 that revealed significant residual disease.  #5 Patient began adjuvant therapy with Topotecan/Avastin starting on 07/24/2012, given thru 06-04-2013. Progression by CA125 marker and CT May 2015. Gemzar  began 07-23-13; cycle 2 on 08-06-13, however even reduced dose was not tolerable to patient such that this was discontinued after second treatment on 08-06-13. She requested additional treatment and had improved enough to allow first Alimta on 10-08-13, tho needed several paracenteses for malignant ascites during that time. She was neutropenic by day 8 cycle 1 Alimta.   Review of systems as above, also: No diarrhea. No productive cough. No new or different pain. No LE swelling. No problems with PAC. Remainder of 10 point Review of Systems negative.  Objective:  Vital signs in last 24 hours:  BP 119/66  Pulse 103  Temp(Src) 98.3 F (36.8 C) (Oral)  Resp 18  Ht _0  (1.702 m)  Wt 134 lb 7 oz (60.98 kg)  BMI 21.05 kg/m2 Weight down one pound  GENERAL: Patient is an ill appearing, chacectic female in no acute distress HEENT:  Sclerae anicteric.  Oropharynx clear and moist. No ulcerations or evidence of oropharyngeal candidiasis. Neck is supple.  NODES:  No cervical, supraclavicular, or axillary lymphadenopathy palpated.  LUNGS:  Right lower lobe diminished breath sounds, clear otherwise, right lower lobe dull to percussion.  No wheezes or rhonchi. HEART:  Regular rate and rhythm. No murmur appreciated. ABDOMEN:  Soft, nontender. Slightly distended,  Positive, normoactive bowel sounds.  MSK:  No focal spinal tenderness to palpation. Full range of motion bilaterally in the upper extremities. EXTREMITIES:  No peripheral edema.   SKIN:  Clear with no obvious rashes or skin changes. No nail dyscrasia. NEURO:  Nonfocal. Well oriented.  Appropriate affect.    Lab Results:  Results for orders placed in visit on 10/18/13  CBC WITH DIFFERENTIAL      Result Value Ref Range   WBC 15.5 (*) 3.9 - 10.3 10e3/uL   NEUT# 13.9 (*) 1.5 - 6.5 10e3/uL   HGB 8.8 (*) 11.6 - 15.9 g/dL   HCT 29.0 (*) 34.8 - 46.6 %   Platelets 173  145 - 400 10e3/uL   MCV 86.7  79.5 - 101.0 fL   MCH 26.2  25.1 - 34.0 pg    MCHC 30.2 (*) 31.5 - 36.0 g/dL   RBC 3.35 (*) 3.70 - 5.45 10e6/uL   RDW 20.4 (*) 11.2 - 14.5 %   lymph# 0.5 (*) 0.9 - 3.3 10e3/uL   MONO# 0.9  0.1 - 0.9 10e3/uL   Eosinophils Absolute 0.1  0.0 - 0.5 10e3/uL   Basophils Absolute 0.0  0.0 - 0.1 10e3/uL   NEUT% 89.9 (*) 38.4 - 76.8 %   LYMPH% 3.1 (*) 14.0 - 49.7 %   MONO% 6.1  0.0 - 14.0 %   EOS% 0.7  0.0 - 7.0 %   BASO% 0.2  0.0 - 2.0 %  COMPREHENSIVE METABOLIC PANEL (BE01)      Result Value Ref Range   Sodium 137  136 - 145 mEq/L   Potassium 3.6  3.5 - 5.1 mEq/L   Chloride 101  98 - 109 mEq/L   CO2 23  22 - 29 mEq/L   Glucose 106  70 -  140 mg/dl   BUN 16.4  7.0 - 26.0 mg/dL   Creatinine 0.7  0.6 - 1.1 mg/dL   Total Bilirubin 0.42  0.20 - 1.20 mg/dL   Alkaline Phosphatase 211 (*) 40 - 150 U/L   AST 19  5 - 34 U/L   ALT 13  0 - 55 U/L   Total Protein 6.8  6.4 - 8.3 g/dL   Albumin 2.3 (*) 3.5 - 5.0 g/dL   Calcium 8.8  8.4 - 10.4 mg/dL   Anion Gap 13 (*) 3 - 11 mEq/L    CMET available after visit normal with exception of Na 133, AP higher at 228, alb 2.1 Urine culture pending from 8-27  Studies/Results: CHEST 2 VIEW 10-11-13 COMPARISON: Chest radiograph 03/29/2013  FINDINGS:  Left chest wall Port-A-Cath is present with tip projecting over the  superior cavoatrial junction. Stable cardiac and mediastinal  contours status post median sternotomy. Interval development of a  moderate right and partially loculated small left pleural effusion.  Underlying heterogeneous opacities. No definite pneumothorax. Mid  thoracic spine degenerative change.  IMPRESSION:  Interval development of moderate right and partially loculated small  left pleural effusions. Underlying opacities may represent  atelectasis or infection.    Medications:   Current Outpatient Prescriptions  Medication Sig Dispense Refill  . ALPRAZolam (XANAX) 0.25 MG tablet Take 1 tablet (0.25 mg total) by mouth every 8 (eight) hours as needed for anxiety.  30 tablet  4   . Alum & Mag Hydroxide-Simeth (MAGIC MOUTHWASH) SOLN Take 5 mLs by mouth 4 (four) times daily. Take 5 ml by mouth four times daily as needed.  240 mL  2  . aspirin EC 81 MG tablet Take 1 tablet (81 mg total) by mouth daily.      . bisoprolol (ZEBETA) 10 MG tablet TAKE ONE TABLET EACH DAY  30 tablet  5  . dexamethasone (DECADRON) 4 MG tablet Take 1 tablet twice a day for 3 days beginning the day prior to Alimta treatment.  6 tablet  2  . docusate sodium (COLACE) 100 MG capsule Take 200 mg by mouth 2 (two) times daily.       [name] EPINEPHrine (EPIPEN 2-PAK) 0.3 mg/0.3 mL DEVI Inject 0.3 mg into the muscle daily as needed (allergic reaction).       . fluticasone (FLONASE) 50 MCG/ACT nasal spray Place 2 sprays into the nose daily as needed for allergies.      . folic acid (FOLVITE) 1 MG tablet Take 1 tablet (1 mg total) by mouth daily.  30 tablet  4  . gabapentin (NEURONTIN) 100 MG capsule TAKE ONE CAPSULE  DAILY      . HYDROcodone-acetaminophen (NORCO/VICODIN) 5-325 MG per tablet Take 1 tablet by mouth every 6 (six) hours as needed for moderate pain.  40 tablet  0  . lidocaine-prilocaine (EMLA) cream Apply topically as needed.  30 g  1  . LORazepam (ATIVAN) 0.5 MG tablet Take 0.5 mg by mouth every 6 (six) hours as needed for anxiety (take one tab every 6 hours as needed for nausea and vomiting).      . nitroGLYCERIN (NITROSTAT) 0.4 MG SL tablet Place 0.4 mg under the tongue every 5 (five) minutes as needed. Chest pain      . omeprazole (PRILOSEC) 20 MG capsule Take 1 capsule (20 mg total) by mouth daily.  30 capsule  2  . ondansetron (ZOFRAN ODT) 8 MG disintegrating tablet Take 1 tablet (8 mg total) by mouth every  8 (eight) hours as needed for nausea or vomiting.  30 tablet  1  . ondansetron (ZOFRAN) 8 MG tablet Take 1 tablet (8 mg total) by mouth 2 (two) times daily. Take two times a day starting the day after chemo for 2 days. Then take two times a day as needed for nausea or vomiting.  30 tablet  1  .  oxybutynin (DITROPAN-XL) 10 MG 24 hr tablet Take 10 mg by mouth daily.      [name] [name] Glycol-Propyl Glycol 0.4-0.3 % SOLN Place 1 drop into both eyes every morning. As needed      . prochlorperazine (COMPAZINE) 10 MG tablet Take 10 mg by mouth every 6 (six) hours as needed for nausea or vomiting.      . senna (SENOKOT) 8.6 MG tablet Take 2 tablets by mouth 2 (two) times daily.       . SF 5000 PLUS 1.1 % CREA dental cream Place 1.1 % onto teeth at bedtime.       [name] trimethoprim (TRIMPEX) 100 MG tablet Take 100 mg by mouth every morning.       . valACYclovir (VALTREX) 500 MG tablet Take 1 tablet (500 mg total) by mouth daily.  30 tablet  7  . venlafaxine XR (EFFEXOR-XR) 75 MG 24 hr capsule TAKE ONE CAPSULE EACH DAY  30 capsule  12  . levofloxacin (LEVAQUIN) 500 MG tablet Take 1 tablet (500 mg total) by mouth daily.  7 tablet  0  . [DISCONTINUED] prochlorperazine (COMPAZINE) 25 MG suppository Place 1 suppository (25 mg total) rectally every 12 (twelve) hours as needed for nausea.  12 suppository  3   No current facility-administered medications for this visit.   Facility-Administered Medications Ordered in Other Visits  Medication Dose Route Frequency Provider Last Rate Last Dose  . sodium chloride 0.9 % injection 10 mL  10 mL Intravenous PRN Deatra Robinson, MD   10 mL at 05/29/13 1210  . sodium chloride 0.9 % injection 10 mL  10 mL Intravenous PRN Minette Headland, NP   10 mL at 06/12/13 0842  . sodium chloride 0.9 % injection 10 mL  10 mL Intravenous PRN Lennis P Livesay, MD      . sodium chloride 0.9 % injection 10 mL  10 mL Intravenous PRN Lennis Marion Downer, MD   10 mL at 08/10/13 1423    DISCUSSION: I spent a great deal of time today talking with Mrs. Soler and her husband about her ovarian cancer and subsequent sickness since starting Alimta.  She looks significantly worse in regards to cachexia since I saw her last.  I will order and schedule for her to receive IV hydration tomorrow.   She does feel improved than last week, and I am happy to hear that her ascites hasn't re-accumulated.  She did tell me that she was not going to receive any more chemotherapy, and we talked about quality of life and what that means to her.  She plans on discussing this more with Dr. Marko Plume next week, and hopefully she will be feeling better from her Alimta adverse effects.    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 Cycle 1 Alimta 8-24, unfortunately significant side effects which we will try to resolve as above, now agrees to no further chemo. Neutropenia post neulasta now oral mucositis and Alimta  rash as above. 2.hypoxemia: related to this disease + long past tobacco.  on home O2 @ 2 liters prn. R>L pleural effusion by CXR this week. Note had bilateral pleurex catheters at presentation, then left talc pleurodesis only.  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 BRCA testing ~ 2012 reportedly negative.  5.post remote hysterectomy  6. 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 She prefers xerostomia to stopping ditropan.  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  The total time spent in the appointment was 30 minutes.   Minette Headland, [place] [phone] 10/18/2013, 2:26 PM

## 2013-10-18 NOTE — Telephone Encounter (Signed)
per pof to sch pt appt for IV fluids-reply from MW-cld pt and spoke w/James (spouse)-adv of time & date of 9/4 IV appt-left message with Erin(pt daughter)as well to adv of appt time & date-spouse Jeneen Rinks understood

## 2013-10-18 NOTE — Telephone Encounter (Signed)
per pof to sch pt appt-sent MW email to sch appt for 9/4-will call pt after reply

## 2013-10-18 NOTE — Telephone Encounter (Signed)
Per staff message and POF I have scheduled appts. Advised scheduler of appts. JMW  

## 2013-10-19 ENCOUNTER — Ambulatory Visit (HOSPITAL_BASED_OUTPATIENT_CLINIC_OR_DEPARTMENT_OTHER): Payer: Medicare Other

## 2013-10-19 VITALS — BP 114/68 | HR 92 | Temp 97.9°F | Resp 18

## 2013-10-19 DIAGNOSIS — C569 Malignant neoplasm of unspecified ovary: Secondary | ICD-10-CM

## 2013-10-19 DIAGNOSIS — E860 Dehydration: Secondary | ICD-10-CM

## 2013-10-19 MED ORDER — SODIUM CHLORIDE 0.9 % IV SOLN
Freq: Once | INTRAVENOUS | Status: AC
  Administered 2013-10-19: 15:00:00 via INTRAVENOUS

## 2013-10-19 MED ORDER — SODIUM CHLORIDE 0.9 % IV SOLN: INTRAVENOUS | Status: DC

## 2013-10-19 NOTE — Addendum Note (Signed)
Addended by: Minette Headland on: 10/19/2013 03:07 PM   Modules accepted: Orders

## 2013-10-19 NOTE — Patient Instructions (Signed)
Dehydration, Adult Dehydration is when you lose more fluids from the body than you take in. Vital organs like the kidneys, brain, and heart cannot function without a proper amount of fluids and salt. Any loss of fluids from the body can cause dehydration.  CAUSES   Vomiting.  Diarrhea.  Excessive sweating.  Excessive urine output.  Fever. SYMPTOMS  Mild dehydration  Thirst.  Dry lips.  Slightly dry mouth. Moderate dehydration  Very dry mouth.  Sunken eyes.  Skin does not bounce back quickly when lightly pinched and released.  Dark urine and decreased urine production.  Decreased tear production.  Headache. Severe dehydration  Very dry mouth.  Extreme thirst.  Rapid, weak pulse (more than 100 beats per minute at rest).  Cold hands and feet.  Not able to sweat in spite of heat and temperature.  Rapid breathing.  Blue lips.  Confusion and lethargy.  Difficulty being awakened.  Minimal urine production.  No tears. DIAGNOSIS  Your caregiver will diagnose dehydration based on your symptoms and your exam. Blood and urine tests will help confirm the diagnosis. The diagnostic evaluation should also identify the cause of dehydration. TREATMENT  Treatment of mild or moderate dehydration can often be done at home by increasing the amount of fluids that you drink. It is best to drink small amounts of fluid more often. Drinking too much at one time can make vomiting worse. Refer to the home care instructions below. Severe dehydration needs to be treated at the hospital where you will probably be given intravenous (IV) fluids that contain water and electrolytes. HOME CARE INSTRUCTIONS   Ask your caregiver about specific rehydration instructions.  Drink enough fluids to keep your urine clear or pale yellow.  Drink small amounts frequently if you have nausea and vomiting.  Eat as you normally do.  Avoid:  Foods or drinks high in sugar.  Carbonated  drinks.  Juice.  Extremely hot or cold fluids.  Drinks with caffeine.  Fatty, greasy foods.  Alcohol.  Tobacco.  Overeating.  Gelatin desserts.  Wash your hands well to avoid spreading bacteria and viruses.  Only take over-the-counter or prescription medicines for pain, discomfort, or fever as directed by your caregiver.  Ask your caregiver if you should continue all prescribed and over-the-counter medicines.  Keep all follow-up appointments with your caregiver. SEEK MEDICAL CARE IF:  You have abdominal pain and it increases or stays in one area (localizes).  You have a rash, stiff neck, or severe headache.  You are irritable, sleepy, or difficult to awaken.  You are weak, dizzy, or extremely thirsty. SEEK IMMEDIATE MEDICAL CARE IF:   You are unable to keep fluids down or you get worse despite treatment.  You have frequent episodes of vomiting or diarrhea.  You have blood or green matter (bile) in your vomit.  You have blood in your stool or your stool looks black and tarry.  You have not urinated in 6 to 8 hours, or you have only urinated a small amount of very dark urine.  You have a fever.  You faint. MAKE SURE YOU:   Understand these instructions.  Will watch your condition.  Will get help right away if you are not doing well or get worse. Document Released: 02/01/2005 Document Revised: 04/26/2011 Document Reviewed: 09/21/2010 Exit[employer] Patient Information 2015 [employer]. This information is not intended to replace advice given to you by your health care provider. Make sure you discuss any questions you have with your health care   provider.  

## 2013-10-21 ENCOUNTER — Other Ambulatory Visit: Payer: Self-pay | Admitting: Oncology

## 2013-10-21 DIAGNOSIS — C569 Malignant neoplasm of unspecified ovary: Secondary | ICD-10-CM

## 2013-10-24 ENCOUNTER — Ambulatory Visit (HOSPITAL_BASED_OUTPATIENT_CLINIC_OR_DEPARTMENT_OTHER): Payer: Medicare Other

## 2013-10-24 ENCOUNTER — Telehealth: Payer: Self-pay | Admitting: Dietician

## 2013-10-24 ENCOUNTER — Encounter: Payer: Self-pay | Admitting: Oncology

## 2013-10-24 ENCOUNTER — Ambulatory Visit (HOSPITAL_BASED_OUTPATIENT_CLINIC_OR_DEPARTMENT_OTHER): Payer: Medicare Other | Admitting: Oncology

## 2013-10-24 ENCOUNTER — Telehealth: Payer: Self-pay | Admitting: Oncology

## 2013-10-24 VITALS — BP 123/67 | HR 101 | Temp 97.7°F | Resp 18 | Ht 67.0 in | Wt 134.8 lb

## 2013-10-24 DIAGNOSIS — Z853 Personal history of malignant neoplasm of breast: Secondary | ICD-10-CM

## 2013-10-24 DIAGNOSIS — C569 Malignant neoplasm of unspecified ovary: Secondary | ICD-10-CM

## 2013-10-24 LAB — CBC WITH DIFFERENTIAL/PLATELET
BASO%: 0.1 % (ref 0.0–2.0)
Basophils Absolute: 0 10*3/uL (ref 0.0–0.1)
EOS ABS: 0.1 10*3/uL (ref 0.0–0.5)
EOS%: 0.5 % (ref 0.0–7.0)
HEMATOCRIT: 29.1 % — AB (ref 34.8–46.6)
HEMOGLOBIN: 8.5 g/dL — AB (ref 11.6–15.9)
LYMPH%: 2.3 % — AB (ref 14.0–49.7)
MCH: 26.2 pg (ref 25.1–34.0)
MCHC: 29.2 g/dL — ABNORMAL LOW (ref 31.5–36.0)
MCV: 89.8 fL (ref 79.5–101.0)
MONO#: 0.7 10*3/uL (ref 0.1–0.9)
MONO%: 3 % (ref 0.0–14.0)
NEUT%: 94.1 % — ABNORMAL HIGH (ref 38.4–76.8)
NEUTROS ABS: 22.4 10*3/uL — AB (ref 1.5–6.5)
PLATELETS: 418 10*3/uL — AB (ref 145–400)
RBC: 3.24 10*6/uL — ABNORMAL LOW (ref 3.70–5.45)
RDW: 21.6 % — ABNORMAL HIGH (ref 11.2–14.5)
WBC: 23.8 10*3/uL — ABNORMAL HIGH (ref 3.9–10.3)
lymph#: 0.5 10*3/uL — ABNORMAL LOW (ref 0.9–3.3)
nRBC: 1 % — ABNORMAL HIGH (ref 0–0)

## 2013-10-24 LAB — COMPREHENSIVE METABOLIC PANEL (CC13)
ALT: 7 U/L (ref 0–55)
ANION GAP: 14 meq/L — AB (ref 3–11)
AST: 19 U/L (ref 5–34)
Albumin: 2.3 g/dL — ABNORMAL LOW (ref 3.5–5.0)
Alkaline Phosphatase: 163 U/L — ABNORMAL HIGH (ref 40–150)
BILIRUBIN TOTAL: 0.43 mg/dL (ref 0.20–1.20)
BUN: 12.5 mg/dL (ref 7.0–26.0)
CO2: 23 meq/L (ref 22–29)
Calcium: 8.7 mg/dL (ref 8.4–10.4)
Chloride: 101 mEq/L (ref 98–109)
Creatinine: 0.7 mg/dL (ref 0.6–1.1)
Glucose: 129 mg/dl (ref 70–140)
Potassium: 3.6 mEq/L (ref 3.5–5.1)
SODIUM: 138 meq/L (ref 136–145)
TOTAL PROTEIN: 6.7 g/dL (ref 6.4–8.3)

## 2013-10-24 MED ORDER — LORAZEPAM 0.5 MG PO TABS: 0.5000 mg | ORAL_TABLET | Freq: Four times a day (QID) | ORAL | Status: AC | PRN

## 2013-10-24 NOTE — Telephone Encounter (Signed)
Brief Outpatient Oncology Nutrition Note  Patient has been identified to be at risk on malnutrition screen.  Wt Readings from Last 10 Encounters:  10/24/13 134 lb 12.8 oz (61.145 kg)  10/18/13 134 lb 7 oz (60.98 kg)  10/15/13 135 lb 3.2 oz (61.326 kg)  10/11/13 146 lb 4.8 oz (66.361 kg)  10/03/13 146 lb 6.4 oz (66.407 kg)  09/24/13 153 lb 1.6 oz (69.446 kg)  08/31/13 151 lb 8 oz (68.72 kg)  08/24/13 149 lb 11.2 oz (67.903 kg)  08/20/13 146 lb 1.6 oz (66.271 kg)  08/06/13 148 lb (67.132 kg)      Dx:  Ovarian Cancer   Called patient secondary to weight loss.  Spoke with patient's husband who reported that patient is eating better and is drinking Boost or Ensure (clear) mixed with gingerale.  Per chart sips on supplement all day otherwise vomits.  Per diet hx, intake is still not adequate but is improved.    Will mail tips to increase calories and protein along with coupons and contact information for the [facility].  Antonieta Iba, RD, LDN

## 2013-10-24 NOTE — Telephone Encounter (Signed)
per pof to sch pt appt-sch & gave pt appt sch

## 2013-10-24 NOTE — Progress Notes (Signed)
OFFICE PROGRESS NOTE   10/24/2013   Physicians:E.Knapp (PCP), Nancy Marus, Rolm Bookbinder, Marlinda Mike, (B.Bartle), Clois Comber, Karl Stonecipher   INTERVAL HISTORY:  Patient is seen, together with husband, in continuing attention to progressive ovarian cancer, unfortunately not able to tolerate recent attempts at chemotherapy with either gemzar or alimta. Last paracentesis was 10-12-13 for 1.8 liters; she does not feel that ascites has recurred significantly yet. She was neutropenic when I saw her last on 8-31, but ANC recovered by APP follow up on 10-18-13. Overall she has felt some better this week, out further from first alimta on 10-08-13.  Appetite is some better with ativan SL prior to meals; she is able to eat cream of wheat and is drinking ~ one supplement drink daily. She has new pain RUQ and left neck "like pulled muscle". She uses O2 intermittently at home, which is helpful. SOB is no worse.  Mucositis has resolved. Bowels are moving. She has had no other fever, completed levaquin. She is still generally weak.  She has PAC, flushed with IVF on 10-19-13. She is BRCA negative. She had flu vaccine last week.  ONCOLOGIC HISTORY   Ovarian cancer (Resolved)   01/06/2012 Initial Diagnosis Ovarian cancer   01/07/2012 - 06/23/2012 Chemotherapy 6 cycles of paciltaxel and carboplatin   05/23/2012 Surgery x-lap with significant residual disease   07/24/2012 - 12/18/2012 Chemotherapy 6 cycles of topotecan and avastin    Ovarian cancer   12/29/2011 Initial Diagnosis Ovarian cancer, IV based on thoracentesis    - 04/21/2012 Chemotherapy 6 cycles of paclitaxel and carboplatin   05/30/2012 Surgery Suboptimal debulking   07/24/2012 - 04/16/2013 Chemotherapy 10 cycles of topotecan and avastin  Patient had 3 sentinel nodes biopsied, all negative. Patient underwent radiation therapy between 06/05/2009 through 07/02/2009. She was then begun on Tamoxifen 20 mg daily in 09/2009. BRCA testing  reportedly negative in ~ 2012.  #2 In 12/2011 she received the diagnosis of gynecologic malignancy presenting with abdominal mass and pleural effusions. Patient was originally hospitalized with shortness of breath in 12/2011. It was discovered she had a malignant pleural effusion (she is status post bilateral Pleurx catheter placement in 12/2011 and left talc pleurodesis by Dr Gilford Raid). She also had malignant ascites. She underwent bilateral thoracentesis and paracentesis procedures during her hospitalization. During her hospitalization she was seen by gynecologic oncology.  #3 Patient began neoadjuvant chemotherapy consisting of Taxol and Carboplatinum. Her first cycle was administered during her hospitalization. She completed 6 cycles of Taxol/Carboplatinum from 01/07/2012 - 06/23/2012. Per patient "very sick with initial chemo and it did not work"  #4. laparotomy in 05/2012 that revealed significant residual disease.  #5 Patient began adjuvant therapy with Topotecan/Avastin starting on 07/24/2012, given thru 06-04-2013. Progression by CA125 marker and CT May 2015. Gemzar began 07-23-13; cycle 2 on 08-06-13, however even reduced dose was not tolerable to patient such that this was discontinued after second treatment on 08-06-13. She requested additional treatment and had improved enough to allow first Alimta on 10-08-13, tho needed several paracenteses for malignant ascites during that time. She was neutropenic by day 8 cycle 1 Alimta.   Review of systems as above, also: No other symptoms of infection. No bleeding. No problems with PAC. No LE swelling. Is able to sleep. No injury to neck, no sore throat Remainder of 10 point Review of Systems negative.  Objective:  Vital signs in last 24 hours:  BP 123/67  Pulse 101  Temp(Src) 97.7 F (36.5 C) (Oral)  Resp 18  Ht _0  (1.702 m)  Wt 134 lb 12.8 oz (61.145 kg)  BMI 21.11 kg/m2  Alert, oriented and appropriate. Ambulatory using rolling walker  in hall and with some assistance in exam room. More talkative and looks a little brighter today. Color poor. Respirations not labored RA at rest. Alopecia  HEENT:PERRL, sclerae not icteric. Oral mucosa very dry without lesions, posterior pharynx clear.  Neck supple. No JVD. Slightly tender along left posterior muscles. Lymphatics:no cervical,suraclavicular, axillary adenopathy Resp: diminished breath sounds thruout otherwise clear to auscultation and percussion bilaterally Cardio: regular rate and rhythm. No gallop. GI: soft, nontender, distended but not to extent previously, no obvious mass or organomegaly. Some bowel sounds. Surgical incision not remarkable. Musculoskeletal/ Extremities: without pitting edema, cords, tenderness Neuro: speech fluent and appropriate. Moves all extremities equally.  Skin without rash, ecchymosis, petechiae Portacath-without erythema or tenderness  Lab Results:  Results for orders placed in visit on 10/24/13  CBC WITH DIFFERENTIAL      Result Value Ref Range   WBC 23.8 (*) 3.9 - 10.3 10e3/uL   NEUT# 22.4 (*) 1.5 - 6.5 10e3/uL   HGB 8.5 (*) 11.6 - 15.9 g/dL   HCT 29.1 (*) 34.8 - 46.6 %   Platelets 418 (*) 145 - 400 10e3/uL   MCV 89.8  79.5 - 101.0 fL   MCH 26.2  25.1 - 34.0 pg   MCHC 29.2 (*) 31.5 - 36.0 g/dL   RBC 3.24 (*) 3.70 - 5.45 10e6/uL   RDW 21.6 (*) 11.2 - 14.5 %   lymph# 0.5 (*) 0.9 - 3.3 10e3/uL   MONO# 0.7  0.1 - 0.9 10e3/uL   Eosinophils Absolute 0.1  0.0 - 0.5 10e3/uL   Basophils Absolute 0.0  0.0 - 0.1 10e3/uL   NEUT% 94.1 (*) 38.4 - 76.8 %   LYMPH% 2.3 (*) 14.0 - 49.7 %   MONO% 3.0  0.0 - 14.0 %   EOS% 0.5  0.0 - 7.0 %   BASO% 0.1  0.0 - 2.0 %   nRBC 1 (*) 0 - 0 %   NOTE neulasta 10-15-13  CMET available after visit: Na 138, K 3.6, creat 0.7, AP 163 (down), alb 2.3 CA 125 by previous method down to 211 from 335 shortly after the Alimta (by new method, which is giving higher values in all patients, 335)  Studies/Results:  No  results found.  Medications: I have reviewed the patient's current medications. She will continue SL ativan before meals for now.  DISCUSSION: Patient tells me that she does not want "any more chemicals in my body now"; we have agreed that she is symptomatically improved further away from Alimta. They do not feel that they need any other equipment or assistance at home just now. She would be Hospice appropriate if she decides not to do further treatment. Fine to use heating pad to left neck, has prn hydrocodone. 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 Cycle 1 Alimta 8-24, unfortunately significant side effects, (tho she may have gotten some benefit from this from standpoint of cancer, with improvement in marker and in  alk phos) . Will watch off treatment next couple of weeks. Appropriate for Hospice if no more treatment. 2.hypoxemia: related to this disease + long past tobacco. on home O2 @ 2 liters prn. R>L pleural effusion by CXR this week. Note had bilateral pleurex catheters at presentation, then left talc pleurodesis only.  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 BRCA testing ~ 2012 reportedly negative.  5.post remote hysterectomy  6. 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 She prefers xerostomia to stopping ditropan.  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 All questions answered and patient/ husband in agreement with plan. TIme spent 25 min including >50% counseling and coordination of care.    Anjel Perfetti P, MD   10/24/2013, 9:18 AM

## 2013-10-25 LAB — CA 125(PREVIOUS METHOD): CA 125: 211.5 U/mL — ABNORMAL HIGH (ref 0.0–30.2)

## 2013-10-25 LAB — CA 125: CA 125: 335 U/mL — AB (ref <35)

## 2013-11-01 ENCOUNTER — Ambulatory Visit (HOSPITAL_BASED_OUTPATIENT_CLINIC_OR_DEPARTMENT_OTHER): Payer: Medicare Other

## 2013-11-01 ENCOUNTER — Telehealth: Payer: Self-pay | Admitting: *Deleted

## 2013-11-01 ENCOUNTER — Ambulatory Visit (HOSPITAL_BASED_OUTPATIENT_CLINIC_OR_DEPARTMENT_OTHER): Payer: Medicare Other | Admitting: Nurse Practitioner

## 2013-11-01 VITALS — BP 110/59 | HR 81 | Temp 98.0°F | Resp 18

## 2013-11-01 VITALS — BP 121/66 | HR 106 | Temp 98.2°F | Resp 18 | Ht 67.0 in | Wt 128.9 lb

## 2013-11-01 DIAGNOSIS — C569 Malignant neoplasm of unspecified ovary: Secondary | ICD-10-CM

## 2013-11-01 DIAGNOSIS — R634 Abnormal weight loss: Secondary | ICD-10-CM

## 2013-11-01 DIAGNOSIS — E860 Dehydration: Secondary | ICD-10-CM

## 2013-11-01 DIAGNOSIS — G893 Neoplasm related pain (acute) (chronic): Secondary | ICD-10-CM

## 2013-11-01 DIAGNOSIS — R630 Anorexia: Secondary | ICD-10-CM

## 2013-11-01 DIAGNOSIS — T451X5A Adverse effect of antineoplastic and immunosuppressive drugs, initial encounter: Secondary | ICD-10-CM

## 2013-11-01 DIAGNOSIS — D6481 Anemia due to antineoplastic chemotherapy: Secondary | ICD-10-CM

## 2013-11-01 DIAGNOSIS — C50919 Malignant neoplasm of unspecified site of unspecified female breast: Secondary | ICD-10-CM

## 2013-11-01 DIAGNOSIS — E8809 Other disorders of plasma-protein metabolism, not elsewhere classified: Secondary | ICD-10-CM

## 2013-11-01 DIAGNOSIS — R530 Neoplastic (malignant) related fatigue: Secondary | ICD-10-CM

## 2013-11-01 DIAGNOSIS — R5381 Other malaise: Secondary | ICD-10-CM

## 2013-11-01 DIAGNOSIS — R5383 Other fatigue: Secondary | ICD-10-CM

## 2013-11-01 DIAGNOSIS — R531 Weakness: Secondary | ICD-10-CM

## 2013-11-01 DIAGNOSIS — R112 Nausea with vomiting, unspecified: Secondary | ICD-10-CM

## 2013-11-01 LAB — CBC WITH DIFFERENTIAL/PLATELET
BASO%: 0.7 % (ref 0.0–2.0)
Basophils Absolute: 0.1 10*3/uL (ref 0.0–0.1)
EOS ABS: 0.1 10*3/uL (ref 0.0–0.5)
EOS%: 0.7 % (ref 0.0–7.0)
HCT: 28.4 % — ABNORMAL LOW (ref 34.8–46.6)
HGB: 8.6 g/dL — ABNORMAL LOW (ref 11.6–15.9)
LYMPH%: 4.9 % — ABNORMAL LOW (ref 14.0–49.7)
MCH: 26.6 pg (ref 25.1–34.0)
MCHC: 30.4 g/dL — ABNORMAL LOW (ref 31.5–36.0)
MCV: 87.5 fL (ref 79.5–101.0)
MONO#: 0.8 10*3/uL (ref 0.1–0.9)
MONO%: 6.6 % (ref 0.0–14.0)
NEUT%: 87.1 % — ABNORMAL HIGH (ref 38.4–76.8)
NEUTROS ABS: 10.8 10*3/uL — AB (ref 1.5–6.5)
Platelets: 762 10*3/uL — ABNORMAL HIGH (ref 145–400)
RBC: 3.25 10*6/uL — ABNORMAL LOW (ref 3.70–5.45)
RDW: 23.7 % — AB (ref 11.2–14.5)
WBC: 12.4 10*3/uL — ABNORMAL HIGH (ref 3.9–10.3)
lymph#: 0.6 10*3/uL — ABNORMAL LOW (ref 0.9–3.3)

## 2013-11-01 LAB — COMPREHENSIVE METABOLIC PANEL
ALT: 9 U/L (ref 0–35)
AST: 31 U/L (ref 0–37)
Albumin: 2.7 g/dL — ABNORMAL LOW (ref 3.5–5.2)
Alkaline Phosphatase: 150 U/L — ABNORMAL HIGH (ref 39–117)
BUN: 15 mg/dL (ref 6–23)
CALCIUM: 9.4 mg/dL (ref 8.4–10.5)
CHLORIDE: 95 meq/L — AB (ref 96–112)
CO2: 24 meq/L (ref 19–32)
CREATININE: 0.75 mg/dL (ref 0.50–1.10)
GLUCOSE: 108 mg/dL — AB (ref 70–99)
Potassium: 4.8 mEq/L (ref 3.5–5.3)
Sodium: 137 mEq/L (ref 135–145)
TOTAL PROTEIN: 7.7 g/dL (ref 6.0–8.3)
Total Bilirubin: 0.4 mg/dL (ref 0.2–1.2)

## 2013-11-01 MED ORDER — SODIUM CHLORIDE 0.9 % IV SOLN
Freq: Once | INTRAVENOUS | Status: AC
  Administered 2013-11-01: 12:00:00 via INTRAVENOUS

## 2013-11-01 MED ORDER — ONDANSETRON 8 MG/50ML IVPB (CHCC)
8.0000 mg | Freq: Once | INTRAVENOUS | Status: AC
  Administered 2013-11-01: 8 mg via INTRAVENOUS

## 2013-11-01 MED ORDER — HEPARIN SOD (PORK) LOCK FLUSH 100 UNIT/ML IV SOLN
500.0000 [IU] | Freq: Once | INTRAVENOUS | Status: AC
  Administered 2013-11-01: 500 [IU] via INTRAVENOUS
  Filled 2013-11-01: qty 5

## 2013-11-01 MED ORDER — SODIUM CHLORIDE 0.9 % IJ SOLN
10.0000 mL | INTRAMUSCULAR | Status: DC | PRN
  Administered 2013-11-01: 10 mL via INTRAVENOUS
  Filled 2013-11-01: qty 10

## 2013-11-01 MED ORDER — ONDANSETRON 8 MG/NS 50 ML IVPB
INTRAVENOUS | Status: AC
  Filled 2013-11-01: qty 8

## 2013-11-01 NOTE — Progress Notes (Signed)
Discharged via w/c at 1438 to lobby in no distress. Has texted spouse to pick her up.

## 2013-11-01 NOTE — Patient Instructions (Signed)
Dehydration, Adult Dehydration is when you lose more fluids from the body than you take in. Vital organs like the kidneys, brain, and heart cannot function without a proper amount of fluids and salt. Any loss of fluids from the body can cause dehydration.  CAUSES   Vomiting.  Diarrhea.  Excessive sweating.  Excessive urine output.  Fever. SYMPTOMS  Mild dehydration  Thirst.  Dry lips.  Slightly dry mouth. Moderate dehydration  Very dry mouth.  Sunken eyes.  Skin does not bounce back quickly when lightly pinched and released.  Dark urine and decreased urine production.  Decreased tear production.  Headache. Severe dehydration  Very dry mouth.  Extreme thirst.  Rapid, weak pulse (more than 100 beats per minute at rest).  Cold hands and feet.  Not able to sweat in spite of heat and temperature.  Rapid breathing.  Blue lips.  Confusion and lethargy.  Difficulty being awakened.  Minimal urine production.  No tears. DIAGNOSIS  Your caregiver will diagnose dehydration based on your symptoms and your exam. Blood and urine tests will help confirm the diagnosis. The diagnostic evaluation should also identify the cause of dehydration. TREATMENT  Treatment of mild or moderate dehydration can often be done at home by increasing the amount of fluids that you drink. It is best to drink small amounts of fluid more often. Drinking too much at one time can make vomiting worse. Refer to the home care instructions below. Severe dehydration needs to be treated at the hospital where you will probably be given intravenous (IV) fluids that contain water and electrolytes. HOME CARE INSTRUCTIONS   Ask your caregiver about specific rehydration instructions.  Drink enough fluids to keep your urine clear or pale yellow.  Drink small amounts frequently if you have nausea and vomiting.  Eat as you normally do.  Avoid:  Foods or drinks high in sugar.  Carbonated  drinks.  Juice.  Extremely hot or cold fluids.  Drinks with caffeine.  Fatty, greasy foods.  Alcohol.  Tobacco.  Overeating.  Gelatin desserts.  Wash your hands well to avoid spreading bacteria and viruses.  Only take over-the-counter or prescription medicines for pain, discomfort, or fever as directed by your caregiver.  Ask your caregiver if you should continue all prescribed and over-the-counter medicines.  Keep all follow-up appointments with your caregiver. SEEK MEDICAL CARE IF:  You have abdominal pain and it increases or stays in one area (localizes).  You have a rash, stiff neck, or severe headache.  You are irritable, sleepy, or difficult to awaken.  You are weak, dizzy, or extremely thirsty. SEEK IMMEDIATE MEDICAL CARE IF:   You are unable to keep fluids down or you get worse despite treatment.  You have frequent episodes of vomiting or diarrhea.  You have blood or green matter (bile) in your vomit.  You have blood in your stool or your stool looks black and tarry.  You have not urinated in 6 to 8 hours, or you have only urinated a small amount of very dark urine.  You have a fever.  You faint. MAKE SURE YOU:   Understand these instructions.  Will watch your condition.  Will get help right away if you are not doing well or get worse. Document Released: 02/01/2005 Document Revised: 04/26/2011 Document Reviewed: 09/21/2010 Exit[employer] Patient Information 2015 [employer]. This information is not intended to replace advice given to you by your health care provider. Make sure you discuss any questions you have with your health care   provider.  

## 2013-11-01 NOTE — Telephone Encounter (Signed)
Received VM from patient's husband, Clair Gulling, stating that [name] [name] has had a hard week and he was wondering if she would benefit from IV fluids. Placed labs, appointment for patient to see Selena Lesser, NP, and appointment in infusion as well if Cyndee feels patient could benefit from IVF. Called and let Clair Gulling know and they are agreeable to be here at 9:45. Husband very appreciative of this.

## 2013-11-01 NOTE — Progress Notes (Signed)
Patient asked for a call with appointment time for tomorrow.

## 2013-11-02 ENCOUNTER — Other Ambulatory Visit: Payer: Self-pay | Admitting: Oncology

## 2013-11-02 ENCOUNTER — Ambulatory Visit (HOSPITAL_BASED_OUTPATIENT_CLINIC_OR_DEPARTMENT_OTHER): Payer: Medicare Other

## 2013-11-02 ENCOUNTER — Encounter: Payer: Self-pay | Admitting: Nurse Practitioner

## 2013-11-02 VITALS — BP 115/78 | HR 87 | Temp 97.8°F | Resp 18

## 2013-11-02 DIAGNOSIS — R531 Weakness: Secondary | ICD-10-CM | POA: Insufficient documentation

## 2013-11-02 DIAGNOSIS — R5383 Other fatigue: Secondary | ICD-10-CM

## 2013-11-02 DIAGNOSIS — R630 Anorexia: Secondary | ICD-10-CM | POA: Insufficient documentation

## 2013-11-02 DIAGNOSIS — R634 Abnormal weight loss: Secondary | ICD-10-CM | POA: Insufficient documentation

## 2013-11-02 DIAGNOSIS — R5381 Other malaise: Secondary | ICD-10-CM

## 2013-11-02 DIAGNOSIS — T451X5A Adverse effect of antineoplastic and immunosuppressive drugs, initial encounter: Secondary | ICD-10-CM

## 2013-11-02 DIAGNOSIS — D6481 Anemia due to antineoplastic chemotherapy: Secondary | ICD-10-CM | POA: Insufficient documentation

## 2013-11-02 MED ORDER — SODIUM CHLORIDE 0.9 % IJ SOLN
10.0000 mL | INTRAMUSCULAR | Status: DC | PRN
  Administered 2013-11-02: 10 mL via INTRAVENOUS
  Filled 2013-11-02: qty 10

## 2013-11-02 MED ORDER — ONDANSETRON 8 MG/NS 50 ML IVPB
INTRAVENOUS | Status: AC
  Filled 2013-11-02: qty 8

## 2013-11-02 MED ORDER — HEPARIN SOD (PORK) LOCK FLUSH 100 UNIT/ML IV SOLN
500.0000 [IU] | Freq: Once | INTRAVENOUS | Status: AC
Start: 2013-11-02 — End: 2013-11-02
  Administered 2013-11-02: 500 [IU] via INTRAVENOUS
  Filled 2013-11-02: qty 5

## 2013-11-02 MED ORDER — SODIUM CHLORIDE 0.9 % IV SOLN
Freq: Once | INTRAVENOUS | Status: AC
  Administered 2013-11-02: 14:00:00 via INTRAVENOUS

## 2013-11-02 MED ORDER — ONDANSETRON 8 MG/50ML IVPB (CHCC)
8.0000 mg | Freq: Once | INTRAVENOUS | Status: AC
  Administered 2013-11-02: 8 mg via INTRAVENOUS

## 2013-11-02 NOTE — Assessment & Plan Note (Signed)
Patient is complaining of worsening fatigue and weakness.  Most likely, this is due to disease progression; as well as a chemotherapy side effect.  Patient was encouraged to remain as active as possible; but appears much weaker than on previous exam-and may be unable to increase her physical activity at this point.

## 2013-11-02 NOTE — Assessment & Plan Note (Addendum)
Patient is complaining of worsening fatigue and weakness.  Most likely, this is do to her cancer diagnosis in previous chemotherapy.  Patient was encouraged to remain as active as possible.

## 2013-11-02 NOTE — Assessment & Plan Note (Signed)
Patient continues dehydrated.  She will receive one maternal saline IV fluid rehydration today.  I she will also return to the [facility] tomorrow to receive additional IV fluid rehydration as well.

## 2013-11-02 NOTE — Assessment & Plan Note (Signed)
Patient states she has minimal appetite.  Most likely, this is do to both of disease progression and a chemotherapy side effect.  Patient was encouraged to try to eat multiple small meals throughout the day.

## 2013-11-02 NOTE — Assessment & Plan Note (Signed)
Albumin remains low; but has slightly improved.  Patient was encouraged to push protein.

## 2013-11-02 NOTE — Progress Notes (Signed)
[place]   Chief Complaint  Patient presents with  . Follow-up    HPI: [name] [name] 68 y.o. female diagnosed with ovarian cancer.  Patient is status post Alimta chemotherapy regimen; last received on 10/08/2013.  Patient presented to the [facility] today for followup.  Both patient and her husband have made the decision to withhold any further chemotherapy treatment at this time.  Patient is complaining of significantly increased fatigue and weakness within the past week or so.  Patient states she has minimal appetite; and has lost greater than 18 pounds within the last 3 weeks.  She feels very dehydrated today; she'll question IV fluid rehydration.  She states that she has a chronic nausea; but has not been vomiting.  She states that her generalized pain has actually improved; and she has only had to take hydrocodone on an occasional basis.  She states that her chronic ascites is not issue at this time.  She denies any recent fevers or chills.   HPI  CURRENT THERAPY: Upcoming Treatment Dates - LUNG Pemetrexed (Alimta) q21d  Days with orders from any treatment category:  10/29/2013      SCHEDULING COMMUNICATION      ondansetron (ZOFRAN) IVPB 8 mg      dexamethasone (DECADRON) injection 10 mg      PEMEtrexed (ALIMTA) 675 mg in sodium chloride 0.9 % 100 mL chemo infusion      sodium chloride 0.9 % injection 10 mL      heparin lock flush 100 unit/mL      heparin lock flush 100 unit/mL      alteplase (CATHFLO ACTIVASE) injection 2 mg      sodium chloride 0.9 % injection 3 mL      0.9 %  sodium chloride infusion      TREATMENT CONDITIONS 11/19/2013      [facility] SCHEDULING COMMUNICATION      ONDANSETRON IVPB ORDERABLE ([facility])      DEXAMETHASONE INJECTION ORDERABLE [facility]      PEMEtrexed (ALIMTA) 675 mg in sodium chloride 0.9 % 100 mL chemo infusion      SODIUM CHLORIDE 0.9 % IJ SOLN      HEPARIN SODIUM LOCK FLUSH 100 UNIT/ML IV SOLN      HEPARIN SODIUM  LOCK FLUSH 100 UNIT/ML IV SOLN      ALTEPLASE 2 MG IJ SOLR      SODIUM CHLORIDE 0.9 % IJ SOLN      cyanocobalamin ((VITAMIN B-12)) injection 1,000 mcg      SODIUM CHLORIDE 0.9 % IV SOLN      TREATMENT CONDITIONS 12/10/2013      [facility] ON[facility] SCHEDULING COMMUNICATION      ONDANSETRON IVPB ORDERABLE ([facility])      DEXAMETHASONE INJECTION ORDERABLE [facility]      PEMEtrexed (ALIMTA) 675 mg in sodium chloride 0.9 % 100 mL chemo infusion      SODIUM CHLORIDE 0.9 % IJ SOLN      HEPARIN SODIUM LOCK FLUSH 100 UNIT/ML IV SOLN      HEPARIN SODIUM LOCK FLUSH 100 UNIT/ML IV SOLN      ALTEPLASE 2 MG IJ SOLR      SODIUM CHLORIDE 0.9 % IJ SOLN      SODIUM CHLORIDE 0.9 % IV SOLN      TREATMENT CONDITIONS    ROS  Past Medical History  Diagnosis Date  . Interstitial cystitis     on chronic antibiotics  . Hyperlipidemia   .  Frequent UTI     on prophylaxis  . 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 CAD (coronary artery disease)     a. s/p CABG 7/13;   b.  LHC 12/01/11:  pLAD 70%, mLAD 40%, CFX 40-50% prior to takeoff of the OM2, oRCA occluded, mid vessel filled via R->R collaterals and distal vessel filled by L->R collaterals, S-OM1/OM2 (small and diffusely dz) with mid 90% stenosis, 90% at OM1 anastomotic site, continuation of OM2 occluded, S-Dx patent, S-PDA occluded, L-LAD ok, EF 55-65%  => Med Rx rec.  [name] Hx of echocardiogram     Echo 5/13: EF 37-34%, grade 2 diastolic dysfunction  . Hypercholesterolemia   . Pleural effusion 12/28/11    s/p pleurx catheter  . GERD (gastroesophageal reflux disease)   . Arthritis     "just a little; lower back" (12/28/11)  . Gout attack 08/2011    related to "stress post OHS"  . Depression   . Breast cancer     "left"; on Tamoxifen  . S/P thoracentesis 12/28/11    "for pleural effusion" (12/28/2011)  . Ovarian cancer   . Tachycardia   . Neuromuscular disorder     peripheral neuropathy  . Ovarian cancer 02/28/2013    Past Surgical History  Procedure Laterality Date  .  Breast lumpectomy  04/2009    left  . Cesarean section  1973; 1976  . Muscle release  1960    L neck and chest.; "when I was 13; pneumonia settled in my left neck"  . Coronary artery bypass graft  08/18/2011    Procedure: CORONARY ARTERY BYPASS GRAFTING (CABG);  Surgeon: Gaye Pollack, MD;  Location: [facility];  Service: Open Heart Surgery;  Laterality: N/A;  Coronary Artery Bypass Graft times five utilizing the left internal mammary artery and the left greater saphenous vein harvested endoscopically.  . Abdominal hysterectomy  1976  . Appendectomy  1976  . Portacath placement  01/06/2012    Procedure: INSERTION PORT-A-CATH;  Surgeon: Gaye Pollack, MD;  Location: [facility];  Service: Thoracic;  Laterality: Left;  . Chest tube insertion  01/06/2012    Procedure: INSERTION PLEURAL DRAINAGE CATHETER;  Surgeon: Gaye Pollack, MD;  Location: [facility];  Service: Thoracic;  Laterality: Bilateral;  . Removal of pleural drainage catheter Right 04/12/2012    Procedure: MINOR REMOVAL OF PLEURAL DRAINAGE CATHETER;  Surgeon: Gaye Pollack, MD;  Location: [facility];  Service: Thoracic;  Laterality: Right;  . [place]c pleurodesis Left 04/12/2012    Procedure: Pietro Cassis;  Surgeon: Gaye Pollack, MD;  Location: [facility];  Service: Thoracic;  Laterality: Left;  . Portacath placement Left 04/17/2012    Procedure: INSERTION PORT-A-CATH;  Surgeon: Gaye Pollack, MD;  Location: [facility];  Service: Thoracic;  Laterality: Left;  . Removal of pleural drainage catheter Left 04/17/2012    Procedure: REMOVAL OF PLEURAL DRAINAGE CATHETER;  Surgeon: Gaye Pollack, MD;  Location: [facility];  Service: Thoracic;  Laterality: Left;  . Port-a-cath removal Left 04/17/2012    Procedure: REMOVAL PORT-A-CATH;  Surgeon: Gaye Pollack, MD;  Location: [facility];  Service: Thoracic;  Laterality: Left;  . Cardiac catheterization    . Laparotomy Bilateral 05/30/2012    Procedure: Resection of umbilical mass, Partial omentectomy;  Surgeon: Alvino Chapel,  MD;  Location: [facility];  Service: Gynecology;  Laterality: Bilateral;    has Breast cancer; Care related to current tamoxifen use; Pure hypercholesterolemia; Unstable angina; Fatigue; Subcutaneous nodule; Coronary artery disease;  Exertional angina; Pleural effusion, bilateral with Pos Cyt met ca; Tachycardia; Dyspnea; HTN (hypertension); Ascites; Syncopal episodes; Cough; Ovarian cancer; Neoplasm related pain (acute) (chronic); Insomnia; Nausea with vomiting; Dehydration; Hypoalbuminemia; Fever; Weakness; Anorexia; Weight loss; Hyperphosphatemia; and Antineoplastic chemotherapy induced anemia(285.3) on her problem list.     is allergic to codeine; isosorbide; other; phenothiazines; talwin; yellow jacket venom; ambien; and tegaderm ag mesh.    Medication List       This list is accurate as of: 11/01/13 11:59 PM.  Always use your most recent med list.               ALPRAZolam 0.25 MG tablet  Commonly known as:  XANAX  Take 1 tablet (0.25 mg total) by mouth every 8 (eight) hours as needed for anxiety.     aspirin EC 81 MG tablet  Take 1 tablet (81 mg total) by mouth daily.     bisoprolol 10 MG tablet  Commonly known as:  ZEBETA  TAKE ONE TABLET EACH DAY     docusate sodium 100 MG capsule  Commonly known as:  COLACE  Take 200 mg by mouth 2 (two) times daily.     EPIPEN 2-PAK 0.3 mg/0.3 mL Devi  Generic drug:  EPINEPHrine  Inject 0.3 mg into the muscle daily as needed (allergic reaction).     fluticasone 50 MCG/ACT nasal spray  Commonly known as:  FLONASE  Place 2 sprays into the nose daily as needed for allergies.     folic acid 1 MG tablet  Commonly known as:  FOLVITE  Take 1 tablet (1 mg total) by mouth daily.     gabapentin 100 MG capsule  Commonly known as:  NEURONTIN  TAKE ONE CAPSULE  DAILY     HYDROcodone-acetaminophen 5-325 MG per tablet  Commonly known as:  NORCO/VICODIN  Take 1 tablet by mouth every 6 (six) hours as needed for moderate pain.      lidocaine-prilocaine cream  Commonly known as:  EMLA  Apply topically as needed.     LORazepam 0.5 MG tablet  Commonly known as:  ATIVAN  Take 1 tablet (0.5 mg total) by mouth every 6 (six) hours as needed for anxiety (take one tab every 6 hours as needed for nausea and vomiting).     magic mouthwash Soln  Take 5 mLs by mouth 4 (four) times daily. Take 5 ml by mouth four times daily as needed.     nitroGLYCERIN 0.4 MG SL tablet  Commonly known as:  NITROSTAT  Place 0.4 mg under the tongue every 5 (five) minutes as needed. Chest pain     omeprazole 20 MG capsule  Commonly known as:  PRILOSEC  Take 1 capsule (20 mg total) by mouth daily.     ondansetron 8 MG disintegrating tablet  Commonly known as:  ZOFRAN ODT  Take 1 tablet (8 mg total) by mouth every 8 (eight) hours as needed for nausea or vomiting.     ondansetron 8 MG tablet  Commonly known as:  ZOFRAN  Take 1 tablet (8 mg total) by mouth 2 (two) times daily. Take two times a day starting the day after chemo for 2 days. Then take two times a day as needed for nausea or vomiting.     oxybutynin 10 MG 24 hr tablet  Commonly known as:  DITROPAN-XL  Take 10 mg by mouth daily.     Polyethyl Glycol-Propyl Glycol 0.4-0.3 % Soln  Place 1 drop into both eyes every morning. As  needed     prochlorperazine 10 MG tablet  Commonly known as:  COMPAZINE  Take 10 mg by mouth every 6 (six) hours as needed for nausea or vomiting.     senna 8.6 MG tablet  Commonly known as:  SENOKOT  Take 2 tablets by mouth 2 (two) times daily.     SF 5000 PLUS 1.1 % Crea dental cream  Generic drug:  sodium fluoride  Place 1.1 % onto teeth at bedtime.     trimethoprim 100 MG tablet  Commonly known as:  TRIMPEX  Take 100 mg by mouth every morning.     valACYclovir 500 MG tablet  Commonly known as:  VALTREX  Take 1 tablet (500 mg total) by mouth daily.     venlafaxine XR 75 MG 24 hr capsule  Commonly known as:  EFFEXOR-XR  TAKE ONE CAPSULE EACH  DAY         PHYSICAL EXAMINATION  Blood pressure 121/66, pulse 106, temperature 98.2 F (36.8 C), temperature source Oral, resp. rate 18, height $RemoveBe'5\' 7"'LYZaWjAuq$  (1.702 m), weight 128 lb 14.4 oz (58.469 kg), SpO2 96.00%.  Physical Exam  Nursing note and vitals reviewed. Constitutional: She is oriented to person, place, and time. Vital signs are normal. She appears malnourished and dehydrated. She appears unhealthy. She has a sickly appearance. No distress.  HENT:  Head: Normocephalic and atraumatic.  Mouth/Throat: No oropharyngeal exudate.  Eyes: Conjunctivae are normal. Pupils are equal, round, and reactive to light. No scleral icterus.  Neck: Normal range of motion. Neck supple. No thyromegaly present.  Cardiovascular: Normal rate, regular rhythm, normal heart sounds and intact distal pulses.  Exam reveals no friction rub.   No murmur heard. Pulmonary/Chest: Effort normal and breath sounds normal. No respiratory distress. She has no wheezes. She has no rales.  Abdominal: Soft. Bowel sounds are normal. She exhibits no distension. There is no tenderness. There is no rebound and no guarding.  Musculoskeletal: Normal range of motion. She exhibits no edema and no tenderness.  Lymphadenopathy:    She has no cervical adenopathy.  Neurological: She is alert and oriented to person, place, and time.  Skin: Skin is warm and dry. No erythema.  Psychiatric: Affect normal.    LABORATORY DATA:. CBC  Lab Results  Component Value Date   WBC 12.4* 11/01/2013   RBC 3.25* 11/01/2013   HGB 8.6* 11/01/2013   HCT 28.4* 11/01/2013   PLT 762* 11/01/2013   MCV 87.5 11/01/2013   MCH 26.6 11/01/2013   MCHC 30.4* 11/01/2013   RDW 23.7* 11/01/2013   LYMPHSABS 0.6* 11/01/2013   MONOABS 0.8 11/01/2013   EOSABS 0.1 11/01/2013   BASOSABS 0.1 11/01/2013     CMET  Lab Results  Component Value Date   NA 137 11/01/2013   K 4.8 11/01/2013   CL 95* 11/01/2013   CO2 24 11/01/2013   GLUCOSE 108* 11/01/2013   BUN 15 11/01/2013     CREATININE 0.75 11/01/2013   CALCIUM 9.4 11/01/2013   PROT 7.7 11/01/2013   ALBUMIN 2.7* 11/01/2013   AST 31 11/01/2013   ALT 9 11/01/2013   ALKPHOS 150* 11/01/2013   BILITOT 0.4 11/01/2013   GFRNONAA 87* 06/02/2012   GFRAA >90 06/02/2012   ASSESSMENT/PLAN:    Fatigue  Assessment & Plan Patient is complaining of worsening fatigue and weakness.  Most likely, this is do to her cancer diagnosis in previous chemotherapy.  Patient was encouraged to remain as active as possible.  Ovarian cancer  Assessment & Plan Patient received her last to chemotherapy on 10/08/2013.  Both patient and her husband have made the decision to withhold any further chemotherapy at this time.   Neoplasm related pain (acute) (chronic)  Assessment & Plan Patient states the her chronic generalized pain is much improved recently; and she takes only minimal hydrocodone on an as-needed basis.   Nausea with vomiting  Assessment & Plan Patient states she continues with some chronic nausea; but has not been vomiting recently.  Patient takes a Zofran at home on an as-needed basis.  She will receive Zofran 8 mg IV for her nausea today.   Dehydration  Assessment & Plan Patient continues dehydrated.  She will receive one maternal saline IV fluid rehydration today.  I she will also return to the [facility] tomorrow to receive additional IV fluid rehydration as well.   Hypoalbuminemia  Assessment & Plan Albumin remains low; but has slightly improved.  Patient was encouraged to push protein.   Weakness  Assessment & Plan Patient is complaining of worsening fatigue and weakness.  Most likely, this is due to disease progression; as well as a chemotherapy side effect.  Patient was encouraged to remain as active as possible; but appears much weaker than on previous exam-and may be unable to increase her physical activity at this  point.   Anorexia  Assessment & Plan Patient states she has minimal appetite.  Most likely, this is do to both of disease progression and a chemotherapy side effect.  Patient was encouraged to try to eat multiple small meals throughout the day.   Weight loss  Assessment & Plan Patient has lost approximately 18 pounds within the past 3 weeks or so.  Patient was encouraged to eat multiple small meals throughout the day; and to push protein is much as possible.   Hyperphosphatemia  Assessment & Plan Alkaline phosphatase remains elevated; but stable.   Antineoplastic chemotherapy induced anemia(285.3)  Assessment & Plan Chemotherapy-induced anemia with been stable at 8.6.    Patient stated understanding of all instructions; and was in agreement with this plan of care. The patient knows to call the clinic with any problems, questions or concerns.   Review/collaboration with Dr. Marko Plume regarding all aspects of patient's visit today.   Total time spent with patient was 40 minutes;  with greater than 75 percent of that time spent in face to face counseling regarding her symptoms, and coordination of care and follow up.  Disclaimer: This note was dictated with voice recognition software. Similar sounding words can inadvertently be transcribed and may not be corrected upon review.   Drue Second, NP 11/02/2013

## 2013-11-02 NOTE — Assessment & Plan Note (Signed)
Alkaline phosphatase remains elevated; but stable.

## 2013-11-02 NOTE — Assessment & Plan Note (Signed)
Patient states the her chronic generalized pain is much improved recently; and she takes only minimal hydrocodone on an as-needed basis.

## 2013-11-02 NOTE — Patient Instructions (Signed)
Dehydration, Adult Dehydration is when you lose more fluids from the body than you take in. Vital organs like the kidneys, brain, and heart cannot function without a proper amount of fluids and salt. Any loss of fluids from the body can cause dehydration.  CAUSES   Vomiting.  Diarrhea.  Excessive sweating.  Excessive urine output.  Fever. SYMPTOMS  Mild dehydration  Thirst.  Dry lips.  Slightly dry mouth. Moderate dehydration  Very dry mouth.  Sunken eyes.  Skin does not bounce back quickly when lightly pinched and released.  Dark urine and decreased urine production.  Decreased tear production.  Headache. Severe dehydration  Very dry mouth.  Extreme thirst.  Rapid, weak pulse (more than 100 beats per minute at rest).  Cold hands and feet.  Not able to sweat in spite of heat and temperature.  Rapid breathing.  Blue lips.  Confusion and lethargy.  Difficulty being awakened.  Minimal urine production.  No tears. DIAGNOSIS  Your caregiver will diagnose dehydration based on your symptoms and your exam. Blood and urine tests will help confirm the diagnosis. The diagnostic evaluation should also identify the cause of dehydration. TREATMENT  Treatment of mild or moderate dehydration can often be done at home by increasing the amount of fluids that you drink. It is best to drink small amounts of fluid more often. Drinking too much at one time can make vomiting worse. Refer to the home care instructions below. Severe dehydration needs to be treated at the hospital where you will probably be given intravenous (IV) fluids that contain water and electrolytes. HOME CARE INSTRUCTIONS   Ask your caregiver about specific rehydration instructions.  Drink enough fluids to keep your urine clear or pale yellow.  Drink small amounts frequently if you have nausea and vomiting.  Eat as you normally do.  Avoid:  Foods or drinks high in sugar.  Carbonated  drinks.  Juice.  Extremely hot or cold fluids.  Drinks with caffeine.  Fatty, greasy foods.  Alcohol.  Tobacco.  Overeating.  Gelatin desserts.  Wash your hands well to avoid spreading bacteria and viruses.  Only take over-the-counter or prescription medicines for pain, discomfort, or fever as directed by your caregiver.  Ask your caregiver if you should continue all prescribed and over-the-counter medicines.  Keep all follow-up appointments with your caregiver. SEEK MEDICAL CARE IF:  You have abdominal pain and it increases or stays in one area (localizes).  You have a rash, stiff neck, or severe headache.  You are irritable, sleepy, or difficult to awaken.  You are weak, dizzy, or extremely thirsty. SEEK IMMEDIATE MEDICAL CARE IF:   You are unable to keep fluids down or you get worse despite treatment.  You have frequent episodes of vomiting or diarrhea.  You have blood or green matter (bile) in your vomit.  You have blood in your stool or your stool looks black and tarry.  You have not urinated in 6 to 8 hours, or you have only urinated a small amount of very dark urine.  You have a fever.  You faint. MAKE SURE YOU:   Understand these instructions.  Will watch your condition.  Will get help right away if you are not doing well or get worse. Document Released: 02/01/2005 Document Revised: 04/26/2011 Document Reviewed: 09/21/2010 Exit[employer] Patient Information 2015 [employer]. This information is not intended to replace advice given to you by your health care provider. Make sure you discuss any questions you have with your health care   provider.  

## 2013-11-02 NOTE — Assessment & Plan Note (Signed)
Patient states she continues with some chronic nausea; but has not been vomiting recently.  Patient takes a Zofran at home on an as-needed basis.  She will receive Zofran 8 mg IV for her nausea today.

## 2013-11-02 NOTE — Assessment & Plan Note (Signed)
Chemotherapy-induced anemia with been stable at 8.6.

## 2013-11-02 NOTE — Assessment & Plan Note (Signed)
Patient received her last to chemotherapy on 10/08/2013.  Both patient and her husband have made the decision to withhold any further chemotherapy at this time.

## 2013-11-02 NOTE — Assessment & Plan Note (Signed)
Patient has lost approximately 18 pounds within the past 3 weeks or so.  Patient was encouraged to eat multiple small meals throughout the day; and to push protein is much as possible.

## 2013-11-05 ENCOUNTER — Telehealth: Payer: Self-pay

## 2013-11-05 NOTE — Telephone Encounter (Signed)
Made Hospice refferal today for [name] [name]. Dr. Marko Plume will be the attending physician.  Hospice may do symptom management. Patient is a DNR.

## 2013-11-05 NOTE — Telephone Encounter (Signed)
Faxed Certificate of medical necessity for home O2 Therapy in F/U of 08-31-13 initial order.  Faxed completed form dated 11-02-13 in addition to 09-24-13 and 10-24-13 office notes to [place]. Sent a copy of order to HIM to be scanned into patient's EMR.

## 2013-11-06 ENCOUNTER — Telehealth: Payer: Self-pay

## 2013-11-06 NOTE — Telephone Encounter (Signed)
[name] [name] a verbal order for patient to have an in home DNR form per Dr. Marko Plume.

## 2013-11-11 ENCOUNTER — Other Ambulatory Visit: Payer: Self-pay | Admitting: Oncology

## 2013-11-11 DIAGNOSIS — C569 Malignant neoplasm of unspecified ovary: Secondary | ICD-10-CM

## 2013-11-15 IMAGING — CT CT CHEST W/ CM
2 of 5 series · 16 of 46 positions shown, 18 images · IV contrast (OMNIPAQUE)
Comparison: CT abdomen pelvis dated 12/31/2011

CT CHEST

CLINICAL DATA: Ovarian cancer diagnosed 0896, chemotherapy in
progress, XRT complete.  Status post left lumpectomy for breast
cancer diagnosed 3799.

CT CHEST, ABDOMEN AND PELVIS WITH CONTRAST
TECHNIQUE: Multidetector CT imaging of the chest, abdomen and
pelvis was performed following the standard protocol during bolus
administration of intravenous contrast.
Contrast: 100mL OMNIPAQUE IOHEXOL 300 MG/ML  SOLN

[Series 2: cap with st · axial · 0.64mm/px · z∈[-528,+52]mm · 13 of 130 slices shown, 15 images]
[im 7/130  soft-tissue]
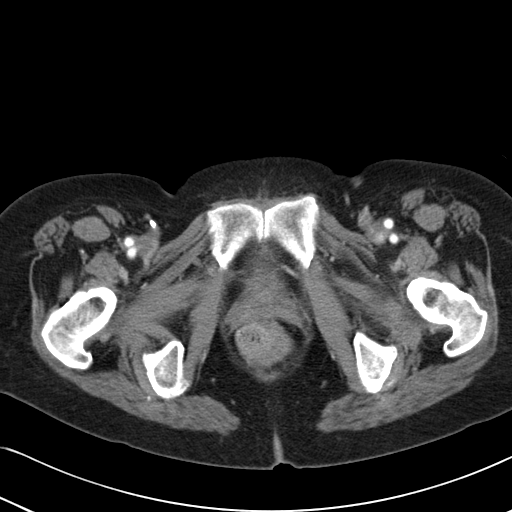
[im 7/130  bone]
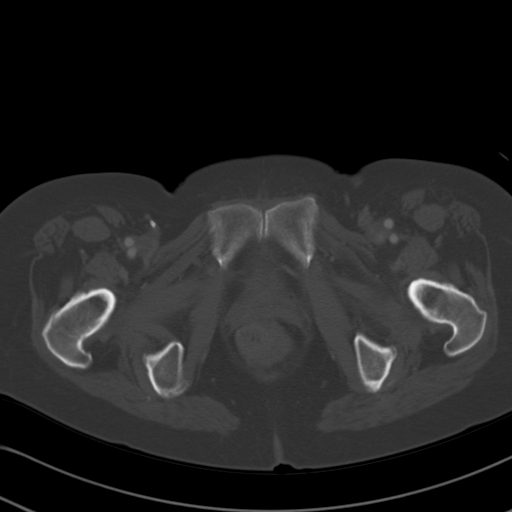
[im 19/130  soft-tissue]
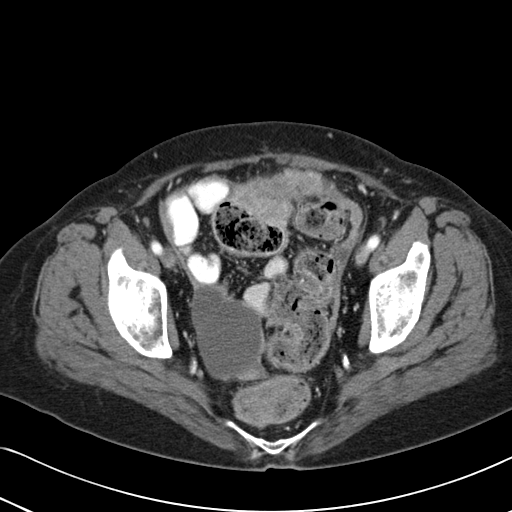
[im 25/130  soft-tissue]
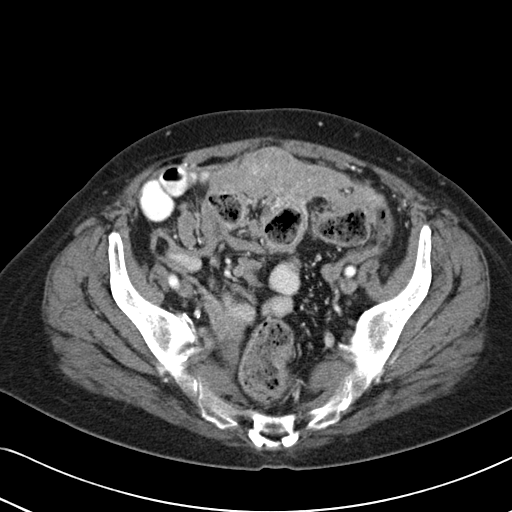
[im 37/130  soft-tissue]
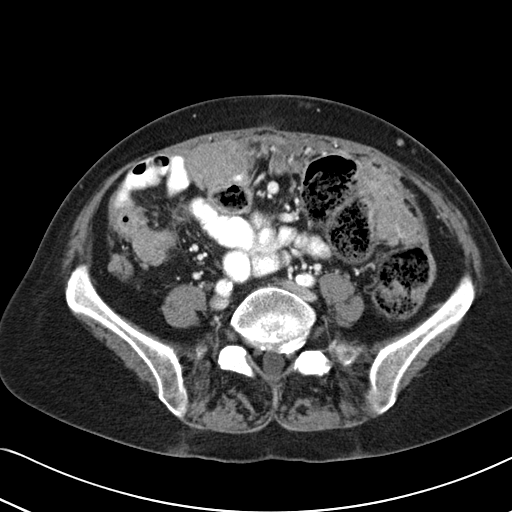
[im 44/130  soft-tissue]
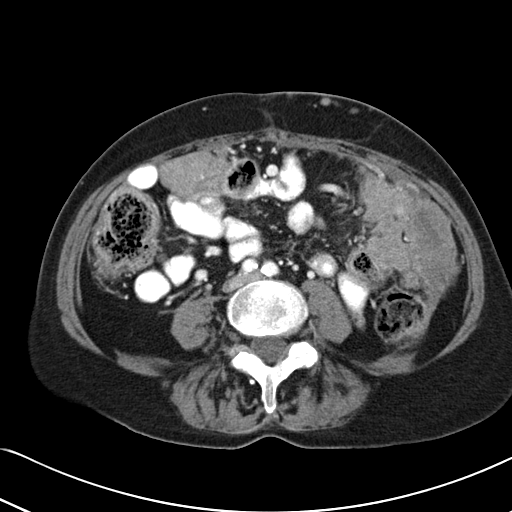
[im 56/130  soft-tissue]
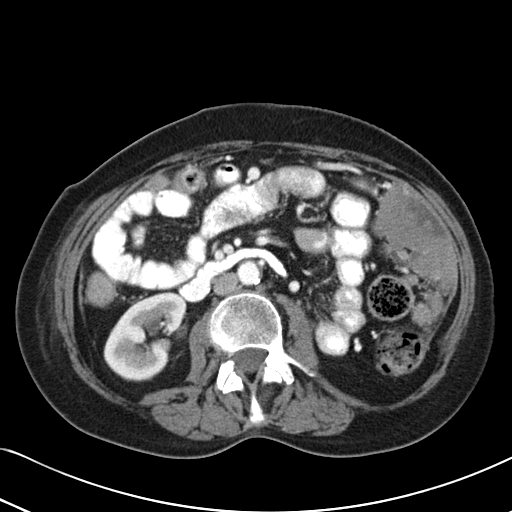
[im 68/130  soft-tissue]
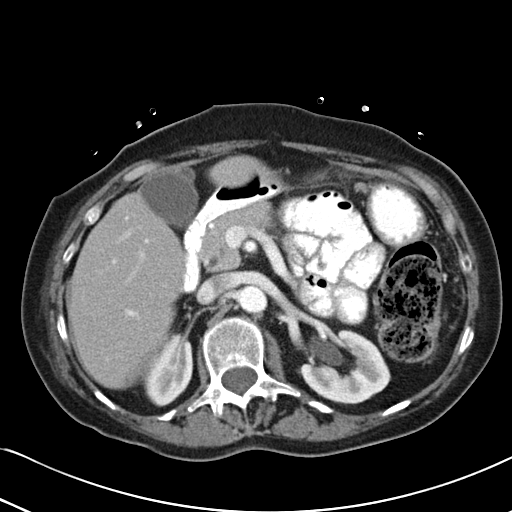
[im 74/130  soft-tissue]
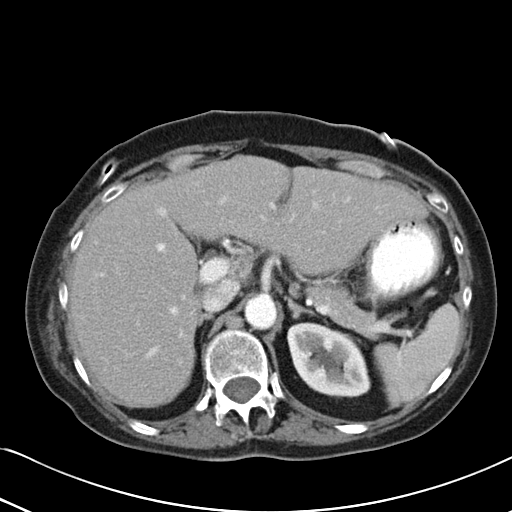
[im 87/130  soft-tissue]
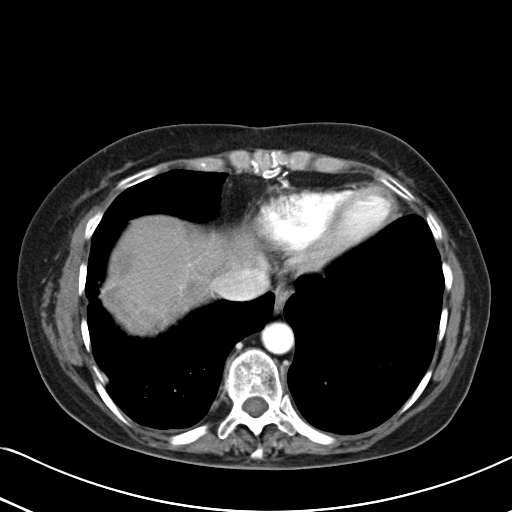
[im 87/130  bone]
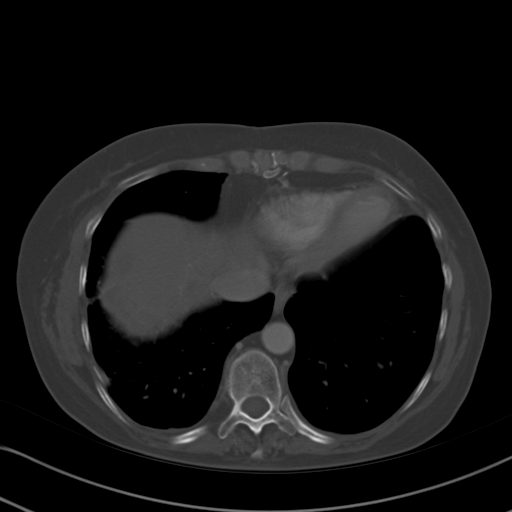
[im 93/130  soft-tissue]
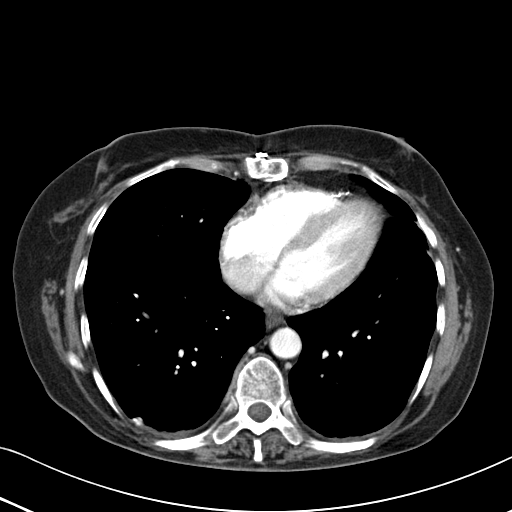
[im 105/130  soft-tissue]
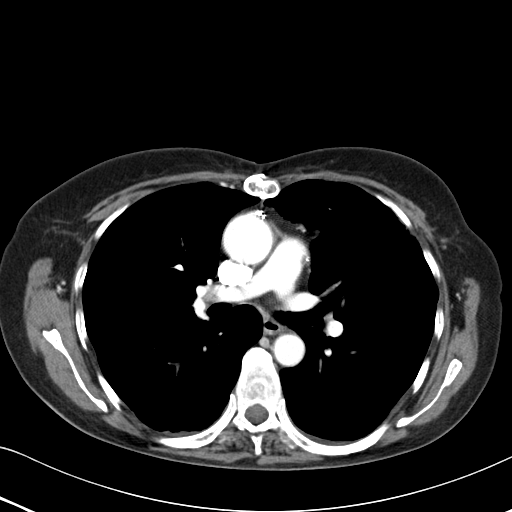
[im 111/130  soft-tissue]
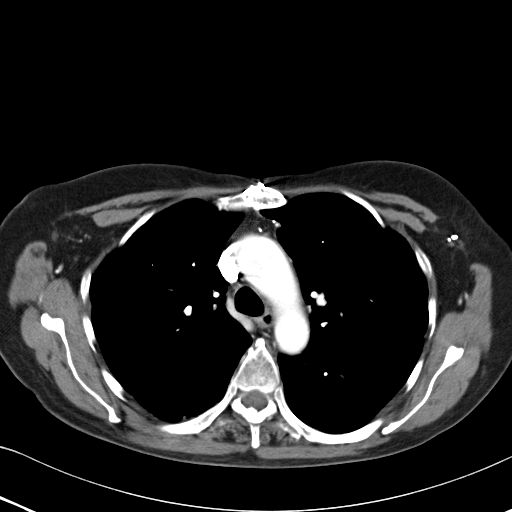
[im 123/130  soft-tissue]
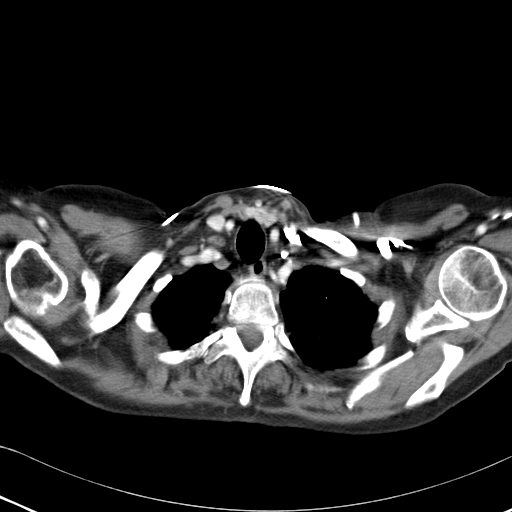

[Series 602: <mpr thick range> · coronal · 1.26mm/px · 3 of 81 slices shown]
[im 27/81  soft-tissue]
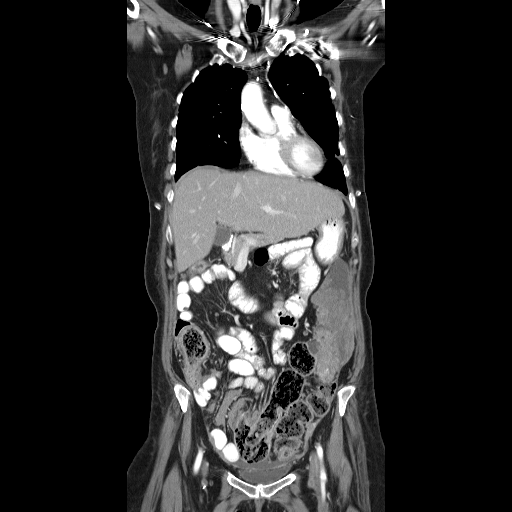
[im 36/81  soft-tissue]
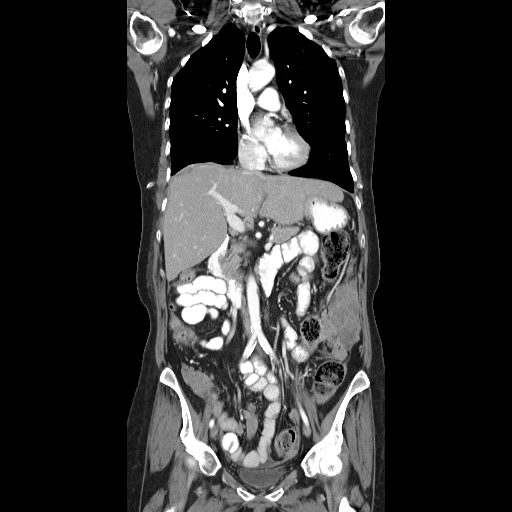
[im 45/81  soft-tissue]
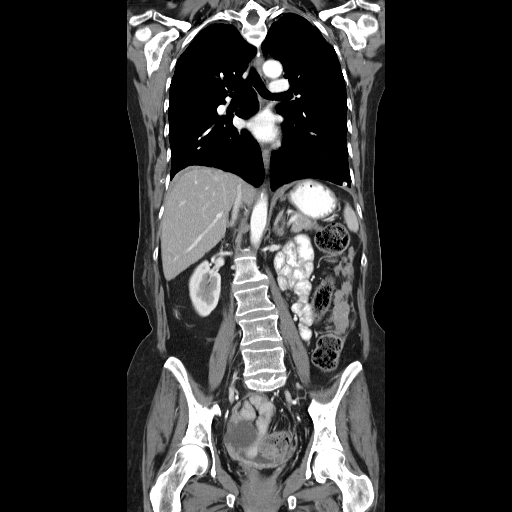

[16 of 46 positions shown; findings below may reference images not displayed]

FINDINGS: No suspicious pulmonary nodules.  Mild subpleural
reticulation in the left upper lobe.  Trace bilateral pleural
effusions with indwelling pleural drains.

Visualized thyroid is unremarkable.

The heart is normal in size.  No pericardial effusion.  Coronary
atherosclerosis.  Postsurgical changes related to prior CABG.

Left chest port.

No suspicious mediastinal, hilar, or axillary lymphadenopathy.

Postsurgical changes in the upper outer left breast and associated
left axillary lymph node dissection.
IMPRESSION: Trace bilateral pleural effusions with indwelling pleural drains.

Otherwise, no evidence of metastatic disease in the chest.

CT ABDOMEN AND PELVIS
FINDINGS: Peritoneal carcinomatosis overlying the right hepatic
dome (series 2/image 52).  Liver is otherwise unremarkable.

Spleen, pancreas, and adrenal glands are within normal limits.

Gallbladder is unremarkable.  No intrahepatic or extrahepatic
ductal dilatation.

Kidneys are within normal limits.  No hydronephrosis.

No evidence of bowel obstruction.

Atherosclerotic calcifications of the abdominal aorta and branch
vessels.

10 mm short axis retrocaval node (series 2/image 61).  Additional
small retroperitoneal nodes which do not meet pathologic CT size
criteria.  12 mm right inguinal node (series 2/image 115),
previously 10 mm.  15 mm left inguinal node (series 2/image 117),
previously 13 mm.

Near complete resolution of prior abdominopelvic ascites.

Peritoneal carcinomatosis, similar versus mildly improved.
Dominant lesion in the left mid abdomen measures 10.6 x 5.8 cm,
previously 12.1 x 6.2 cm when measured in a similar fashion.
Additional omental caking beneath the lower anterior abdominal
wall.

7.1 x 4.5 cm complex cystic right ovarian mass (series 2/image 12),
previously 6 x 1 x 3.9 cm.

Status post hysterectomy.

Bladder is unremarkable.

Nodularity/thickening in the periumbilical region are worrisome for
tumor (series 2/image 82).  Additional subcutaneous lesions in the
anterior abdominal wall are nonspecific.

Mild degenerative changes of the visualized thoracolumbar spine.
IMPRESSION: 7.1 x 4.5 cm complex cystic right ovarian mass, mildly increased.

Peritoneal carcinomatosis, as described above, stable versus mildly
improved.

Near complete resolution of prior abdominopelvic ascites.

Suspected bilateral inguinal nodal metastases, mildly increased.

## 2013-11-16 ENCOUNTER — Telehealth: Payer: Self-pay | Admitting: Oncology

## 2013-11-16 NOTE — Telephone Encounter (Signed)
, °

## 2013-11-19 ENCOUNTER — Ambulatory Visit: Payer: Medicare Other | Admitting: Oncology

## 2013-11-19 ENCOUNTER — Other Ambulatory Visit: Payer: Medicare Other

## 2013-11-26 ENCOUNTER — Telehealth: Payer: Self-pay

## 2013-11-26 NOTE — Telephone Encounter (Signed)
Sherry calling to let Dr. Marko Plume know that Ms. Ghee given compazine over the weekend for increased N/V.  This was effective in managing this symptom. She also was given Doculax suppository for some constipation.

## 2013-12-03 ENCOUNTER — Other Ambulatory Visit: Payer: Self-pay | Admitting: *Deleted

## 2013-12-03 DIAGNOSIS — C50919 Malignant neoplasm of unspecified site of unspecified female breast: Secondary | ICD-10-CM

## 2013-12-03 DIAGNOSIS — C569 Malignant neoplasm of unspecified ovary: Secondary | ICD-10-CM

## 2013-12-03 MED ORDER — ONDANSETRON 8 MG PO TBDP: 8.0000 mg | ORAL_TABLET | Freq: Three times a day (TID) | ORAL | Status: AC | PRN

## 2013-12-18 ENCOUNTER — Telehealth: Payer: Self-pay | Admitting: *Deleted

## 2013-12-18 DIAGNOSIS — C569 Malignant neoplasm of unspecified ovary: Secondary | ICD-10-CM

## 2013-12-18 MED ORDER — HYDROCODONE-ACETAMINOPHEN 5-325 MG PO TABS: 2.0000 | ORAL_TABLET | ORAL | Status: AC | PRN

## 2013-12-18 NOTE — Telephone Encounter (Signed)
Received call from [facility] stating that pt is taking hydrocodone/APAP one tab q 6 hours for pain & is having increased pain lower abd & back & request increase to 2 tabs q 4 hours prn & will also need refill faxed to [facility].  Discussed with Charlestine Massed NP & script faxed to [facility].

## 2013-12-25 ENCOUNTER — Telehealth: Payer: Self-pay | Admitting: *Deleted

## 2013-12-25 MED ORDER — VENLAFAXINE HCL 75 MG PO TABS: ORAL_TABLET | ORAL | Status: AC

## 2013-12-25 NOTE — Telephone Encounter (Signed)
Prescription e-scribed to [place] and left VM for Tracy, [facility] RN, that this was done.

## 2013-12-25 NOTE — Telephone Encounter (Signed)
-----   Message from Gordy Levan, MD sent at 12/25/2013  2:16 PM EST ----- Regarding: RE: Effexor tablet instead of capsule Yes, thank you. Maybe prescription for just 2 weeks at a time? ----- Message -----    From: Christa See, RN    Sent: 12/25/2013   1:53 PM      To: Gordy Levan, MD Subject: Effexor tablet instead of capsule              Arbie Cookey, hospice RN, called stating that Mrs. Stull can no longer swallow her 75mg  Effexor ER capsule. She checked and her drug store has 75mg  tablets that can be cut in half and placed in applesauce. Arbie Cookey states pt wants to continue Effexor. Is it okay to switch to this? Thanks! Cyril Mourning

## 2014-01-02 ENCOUNTER — Telehealth: Payer: Self-pay

## 2014-01-02 NOTE — Telephone Encounter (Signed)
Told Abigail Butts that Arbie Cookey called and left a message regarding [name] having increased nausea and diarrhea.   Her pain is not controlled.  Arbie Cookey wanted to know if [name] could be switched to an oral concentrated pain med. Renda Rolls that this nurse was unable to reach [place] at [phone].  Suggested that Arbie Cookey or Abigail Butts can discuss pain med with the Hospice physician as this may be the most expedient at this time of the day. Abigail Butts will call Arbie Cookey and let her know.

## 2014-01-08 ENCOUNTER — Other Ambulatory Visit: Payer: Self-pay | Admitting: *Deleted

## 2014-01-08 DIAGNOSIS — C569 Malignant neoplasm of unspecified ovary: Secondary | ICD-10-CM

## 2014-01-08 MED ORDER — OMEPRAZOLE 20 MG PO CPDR: 20.0000 mg | DELAYED_RELEASE_CAPSULE | Freq: Every day | ORAL | Status: AC

## 2014-01-24 ENCOUNTER — Encounter (HOSPITAL_COMMUNITY): Payer: Self-pay | Admitting: Cardiology

## 2014-03-07 ENCOUNTER — Other Ambulatory Visit: Payer: Self-pay | Admitting: Cardiovascular Disease

## 2014-03-14 ENCOUNTER — Other Ambulatory Visit: Payer: Self-pay | Admitting: Oncology

## 2014-03-14 ENCOUNTER — Telehealth: Payer: Self-pay | Admitting: Oncology

## 2014-03-14 NOTE — Progress Notes (Signed)
Medical Oncology  Notified by Hospice that patient died at home on 2014-03-25 at 11:23 PM. Sympathy letter written to family, and other physicians to be informed by this note.   Evlyn Clines, MD

## 2014-03-14 NOTE — Telephone Encounter (Signed)
Death cert recived.

## 2014-03-18 DEATH — deceased

## 2014-04-22 NOTE — Progress Notes (Unsigned)
This encounter was created in error - please disregard.

## 2015-08-04 ENCOUNTER — Other Ambulatory Visit: Payer: Self-pay | Admitting: Nurse Practitioner
